# Patient Record
Sex: Female | Born: 1940
Health system: Southern US, Community
[De-identification: ages and names within clinical notes are randomized; demographics above are authoritative.]

## PROBLEM LIST (undated history)

## (undated) DIAGNOSIS — D649 Anemia, unspecified: Secondary | ICD-10-CM

## (undated) DIAGNOSIS — M545 Low back pain, unspecified: Secondary | ICD-10-CM

## (undated) DIAGNOSIS — I499 Cardiac arrhythmia, unspecified: Secondary | ICD-10-CM

## (undated) DIAGNOSIS — I272 Pulmonary hypertension, unspecified: Secondary | ICD-10-CM

## (undated) DIAGNOSIS — K219 Gastro-esophageal reflux disease without esophagitis: Secondary | ICD-10-CM

## (undated) DIAGNOSIS — I10 Essential (primary) hypertension: Secondary | ICD-10-CM

## (undated) DIAGNOSIS — F32A Depression, unspecified: Secondary | ICD-10-CM

## (undated) DIAGNOSIS — R0600 Dyspnea, unspecified: Secondary | ICD-10-CM

## (undated) DIAGNOSIS — F329 Major depressive disorder, single episode, unspecified: Secondary | ICD-10-CM

## (undated) DIAGNOSIS — G8929 Other chronic pain: Secondary | ICD-10-CM

## (undated) DIAGNOSIS — F419 Anxiety disorder, unspecified: Secondary | ICD-10-CM

## (undated) DIAGNOSIS — Z8719 Personal history of other diseases of the digestive system: Secondary | ICD-10-CM

## (undated) DIAGNOSIS — G4730 Sleep apnea, unspecified: Secondary | ICD-10-CM

## (undated) DIAGNOSIS — M1990 Unspecified osteoarthritis, unspecified site: Secondary | ICD-10-CM

## (undated) HISTORY — DX: Anxiety disorder, unspecified: F41.9

## (undated) HISTORY — DX: Major depressive disorder, single episode, unspecified: F32.9

## (undated) HISTORY — DX: Low back pain, unspecified: M54.50

## (undated) HISTORY — DX: Unspecified osteoarthritis, unspecified site: M19.90

## (undated) HISTORY — PX: BREAST BIOPSY: SHX20

## (undated) HISTORY — DX: Depression, unspecified: F32.A

## (undated) HISTORY — DX: Gastro-esophageal reflux disease without esophagitis: K21.9

## (undated) HISTORY — DX: Other chronic pain: G89.29

## (undated) HISTORY — DX: Low back pain: M54.5

---

## 1979-03-17 HISTORY — PX: ABDOMINAL HYSTERECTOMY: SHX81

## 2012-01-04 LAB — HM COLONOSCOPY

## 2012-03-16 HISTORY — PX: CATARACT EXTRACTION, BILATERAL: SHX1313

## 2015-08-15 DIAGNOSIS — F418 Other specified anxiety disorders: Secondary | ICD-10-CM | POA: Diagnosis not present

## 2015-08-15 DIAGNOSIS — L821 Other seborrheic keratosis: Secondary | ICD-10-CM | POA: Diagnosis not present

## 2015-08-15 DIAGNOSIS — R32 Unspecified urinary incontinence: Secondary | ICD-10-CM | POA: Diagnosis not present

## 2015-08-22 DIAGNOSIS — Z1231 Encounter for screening mammogram for malignant neoplasm of breast: Secondary | ICD-10-CM | POA: Diagnosis not present

## 2015-11-11 DIAGNOSIS — H02831 Dermatochalasis of right upper eyelid: Secondary | ICD-10-CM | POA: Diagnosis not present

## 2015-11-11 DIAGNOSIS — H353132 Nonexudative age-related macular degeneration, bilateral, intermediate dry stage: Secondary | ICD-10-CM | POA: Diagnosis not present

## 2015-11-11 DIAGNOSIS — H532 Diplopia: Secondary | ICD-10-CM | POA: Diagnosis not present

## 2015-11-11 DIAGNOSIS — H43813 Vitreous degeneration, bilateral: Secondary | ICD-10-CM | POA: Diagnosis not present

## 2015-11-11 DIAGNOSIS — H02834 Dermatochalasis of left upper eyelid: Secondary | ICD-10-CM | POA: Diagnosis not present

## 2016-02-12 DIAGNOSIS — R748 Abnormal levels of other serum enzymes: Secondary | ICD-10-CM | POA: Diagnosis not present

## 2016-02-12 DIAGNOSIS — Z23 Encounter for immunization: Secondary | ICD-10-CM | POA: Diagnosis not present

## 2016-02-12 DIAGNOSIS — E785 Hyperlipidemia, unspecified: Secondary | ICD-10-CM | POA: Diagnosis not present

## 2016-02-12 DIAGNOSIS — Z13820 Encounter for screening for osteoporosis: Secondary | ICD-10-CM | POA: Diagnosis not present

## 2016-02-12 DIAGNOSIS — Z0000 Encounter for general adult medical examination without abnormal findings: Secondary | ICD-10-CM | POA: Diagnosis not present

## 2016-02-12 DIAGNOSIS — Z780 Asymptomatic menopausal state: Secondary | ICD-10-CM | POA: Diagnosis not present

## 2016-02-12 DIAGNOSIS — F419 Anxiety disorder, unspecified: Secondary | ICD-10-CM | POA: Diagnosis not present

## 2016-02-12 DIAGNOSIS — R7301 Impaired fasting glucose: Secondary | ICD-10-CM | POA: Diagnosis not present

## 2016-02-25 DIAGNOSIS — M25561 Pain in right knee: Secondary | ICD-10-CM | POA: Diagnosis not present

## 2016-03-24 DIAGNOSIS — Z780 Asymptomatic menopausal state: Secondary | ICD-10-CM | POA: Diagnosis not present

## 2016-08-26 DIAGNOSIS — J029 Acute pharyngitis, unspecified: Secondary | ICD-10-CM | POA: Diagnosis not present

## 2016-08-26 DIAGNOSIS — J069 Acute upper respiratory infection, unspecified: Secondary | ICD-10-CM | POA: Diagnosis not present

## 2016-11-12 DIAGNOSIS — H02834 Dermatochalasis of left upper eyelid: Secondary | ICD-10-CM | POA: Diagnosis not present

## 2016-11-12 DIAGNOSIS — H02831 Dermatochalasis of right upper eyelid: Secondary | ICD-10-CM | POA: Diagnosis not present

## 2016-11-12 DIAGNOSIS — H353132 Nonexudative age-related macular degeneration, bilateral, intermediate dry stage: Secondary | ICD-10-CM | POA: Diagnosis not present

## 2016-11-12 DIAGNOSIS — H43813 Vitreous degeneration, bilateral: Secondary | ICD-10-CM | POA: Diagnosis not present

## 2016-11-12 DIAGNOSIS — H532 Diplopia: Secondary | ICD-10-CM | POA: Diagnosis not present

## 2016-11-17 DIAGNOSIS — Z1231 Encounter for screening mammogram for malignant neoplasm of breast: Secondary | ICD-10-CM | POA: Diagnosis not present

## 2016-12-09 DIAGNOSIS — Z23 Encounter for immunization: Secondary | ICD-10-CM | POA: Diagnosis not present

## 2016-12-09 LAB — HM MAMMOGRAPHY: HM MAMMO: NORMAL (ref 0–4)

## 2017-03-31 ENCOUNTER — Ambulatory Visit (HOSPITAL_COMMUNITY)
Admission: RE | Admit: 2017-03-31 | Discharge: 2017-03-31 | Disposition: A | Discharge status: Home / Self Care | Payer: Medicare Other | Source: Ambulatory Visit | Attending: Physician Assistant | Admitting: Physician Assistant

## 2017-03-31 ENCOUNTER — Ambulatory Visit (INDEPENDENT_AMBULATORY_CARE_PROVIDER_SITE_OTHER): Payer: Medicare Other | Admitting: Physician Assistant

## 2017-03-31 ENCOUNTER — Encounter: Payer: Self-pay | Admitting: Physician Assistant

## 2017-03-31 VITALS — BP 122/68 | HR 87 | Temp 97.7°F | Resp 16 | Ht 70.0 in | Wt 223.4 lb

## 2017-03-31 DIAGNOSIS — K2100 Gastro-esophageal reflux disease with esophagitis, without bleeding: Secondary | ICD-10-CM

## 2017-03-31 DIAGNOSIS — Z6832 Body mass index (BMI) 32.0-32.9, adult: Secondary | ICD-10-CM

## 2017-03-31 DIAGNOSIS — F32A Depression, unspecified: Secondary | ICD-10-CM | POA: Insufficient documentation

## 2017-03-31 DIAGNOSIS — Z7189 Other specified counseling: Secondary | ICD-10-CM | POA: Diagnosis not present

## 2017-03-31 DIAGNOSIS — R6889 Other general symptoms and signs: Secondary | ICD-10-CM | POA: Diagnosis not present

## 2017-03-31 DIAGNOSIS — Z0000 Encounter for general adult medical examination without abnormal findings: Secondary | ICD-10-CM

## 2017-03-31 DIAGNOSIS — M5442 Lumbago with sciatica, left side: Secondary | ICD-10-CM | POA: Diagnosis not present

## 2017-03-31 DIAGNOSIS — M545 Low back pain, unspecified: Secondary | ICD-10-CM | POA: Insufficient documentation

## 2017-03-31 DIAGNOSIS — M150 Primary generalized (osteo)arthritis: Secondary | ICD-10-CM

## 2017-03-31 DIAGNOSIS — M159 Polyosteoarthritis, unspecified: Secondary | ICD-10-CM

## 2017-03-31 DIAGNOSIS — G8929 Other chronic pain: Secondary | ICD-10-CM

## 2017-03-31 DIAGNOSIS — R05 Cough: Secondary | ICD-10-CM | POA: Diagnosis not present

## 2017-03-31 DIAGNOSIS — R059 Cough, unspecified: Secondary | ICD-10-CM

## 2017-03-31 DIAGNOSIS — M1990 Unspecified osteoarthritis, unspecified site: Secondary | ICD-10-CM | POA: Insufficient documentation

## 2017-03-31 DIAGNOSIS — Z0001 Encounter for general adult medical examination with abnormal findings: Secondary | ICD-10-CM

## 2017-03-31 DIAGNOSIS — K449 Diaphragmatic hernia without obstruction or gangrene: Secondary | ICD-10-CM | POA: Diagnosis not present

## 2017-03-31 DIAGNOSIS — D649 Anemia, unspecified: Secondary | ICD-10-CM | POA: Diagnosis not present

## 2017-03-31 DIAGNOSIS — E538 Deficiency of other specified B group vitamins: Secondary | ICD-10-CM | POA: Diagnosis not present

## 2017-03-31 DIAGNOSIS — F3342 Major depressive disorder, recurrent, in full remission: Secondary | ICD-10-CM | POA: Diagnosis not present

## 2017-03-31 DIAGNOSIS — G250 Essential tremor: Secondary | ICD-10-CM

## 2017-03-31 DIAGNOSIS — F329 Major depressive disorder, single episode, unspecified: Secondary | ICD-10-CM | POA: Insufficient documentation

## 2017-03-31 MED ORDER — PROMETHAZINE-DM 6.25-15 MG/5ML PO SYRP: 5.0000 mL | ORAL_SOLUTION | Freq: Four times a day (QID) | ORAL | 1 refills | Status: DC | PRN

## 2017-03-31 MED ORDER — AZITHROMYCIN 250 MG PO TABS: ORAL_TABLET | ORAL | 1 refills | Status: DC

## 2017-03-31 NOTE — Progress Notes (Addendum)
Subjective:    Patient ID: Darlene [name], female    DOB: 1940-11-26, 77 y.o.   MRN: [id number]  HPI 77 y.o. WF as a new patient presents with a non productive cough x Nov and medicare wellness visit.   Her daughter is a patient here, cindy Research scientist (life sciences), she moved from [place] to live with her. Developed cough x Nov with moving here. She has runny nose, coughing until she vomits or loses her breath. Has tried cough drops, has tried robitussin and nyquil.  She has GERD, is on prevacid once a day, will occ miss it, still having GERD.  Got flu shot 12/09/2016. She is not driving anymore, daughter is driving.  BMI is Body mass index is 32.05 kg/m., she is working on diet and exercise. Wt Readings from Last 3 Encounters:  03/31/17 223 lb 6.4 oz (101.3 kg)    Blood pressure 122/68, pulse 87, temperature 97.7 F (36.5 C), resp. rate 16, height 5\' 10"  (1.778 m), weight 223 lb 6.4 oz (101.3 kg), SpO2 95 %.  Medications Current Outpatient Medications on File Prior to Visit  Medication Sig  . acetaminophen (TYLENOL) 500 MG tablet Take 500 mg by mouth every 6 (six) hours as needed.  Marland Kitchen aspirin EC 81 MG tablet Take 81 mg by mouth daily.  . Cholecalciferol (VITAMIN D-3 PO) Take 1,000 Units by mouth daily.  . cyclobenzaprine (FLEXERIL) 10 MG tablet Take 10 mg by mouth 3 (three) times daily as needed for muscle spasms.  . diclofenac (VOLTAREN) 75 MG EC tablet Take 75 mg by mouth 2 (two) times daily.  . lansoprazole (PREVACID) 15 MG capsule Take 15 mg by mouth daily at 12 noon.  Marland Kitchen PARoxetine (PAXIL) 20 MG tablet Take 20 mg by mouth daily.  . pilocarpine (PILOCAR) 1 % ophthalmic solution 1 drop 2 (two) times daily.   No current facility-administered medications on file prior to visit.     Problem list She has DJD (degenerative joint disease); Chronic lower back pain; and Depression on their problem list.  Allergies No Known Allergies  SURGICAL HISTORY She  has a past surgical history that includes  Abdominal hysterectomy (03/1979) and Breast biopsy (Left, 1986 and 1987). FAMILY HISTORY Her family history includes Heart disease in her father and mother; Hypertension in her daughter, father, and mother; Tremor in her sister. SOCIAL HISTORY She  reports that  has never smoked. she has never used smokeless tobacco. She reports that she drinks alcohol. She reports that she does not use drugs.   MEDICARE WELLNESS OBJECTIVES: Physical activity: Current Exercise Habits: The patient does not participate in regular exercise at present, Exercise limited by: orthopedic condition(s) Cardiac risk factors: Cardiac Risk Factors include: advanced age (>32men, >3 women);obesity (BMI >30kg/m2);sedentary lifestyle Depression/mood screen:   Depression screen [employer] 2/9 03/31/2017  Decreased Interest 0  Down, Depressed, Hopeless 0  PHQ - 2 Score 0    ADLs:  In your present state of health, do you have any difficulty performing the following activities: 03/31/2017  Hearing? N  Vision? N  Difficulty concentrating or making decisions? N  Walking or climbing stairs? Y  Comment due to ortho  Dressing or bathing? N  Doing errands, shopping? Y  Comment daughter is driving her  Conservation officer, nature and eating ? N  Using the Toilet? N  In the past six months, have you accidently leaked urine? Y  Do you have problems with loss of bowel control? Y  Managing your Medications? N  Managing your  Finances? N  Housekeeping or managing your Housekeeping? N    Cognitive Testing  Alert? Yes  Normal Appearance?Yes  Oriented to person? Yes  Place? Yes   Time? Yes  Recall of three objects?  Yes  Can perform simple calculations? Yes  Displays appropriate judgment?Yes  Can read the correct time from a watch face?Yes  EOL planning: Does Patient Have a Medical Advance Directive?: No Would patient like information on creating a medical advance directive?: Yes (MAU/Ambulatory/Procedural Areas - Information given)  Review of  Systems  Constitutional: Negative for chills, fatigue and fever.  HENT: Positive for postnasal drip. Negative for sore throat.   Eyes: Negative.   Respiratory: Positive for cough. Negative for choking, shortness of breath and wheezing.   Cardiovascular: Negative.  Negative for chest pain.  Gastrointestinal: Positive for abdominal distention, abdominal pain and nausea. Negative for anal bleeding, blood in stool, constipation, diarrhea, rectal pain and vomiting.  Genitourinary: Negative.   Musculoskeletal: Negative.   Skin: Negative.   Neurological: Negative.   Psychiatric/Behavioral: Negative.    Review of Systems  Constitutional: Negative for chills, fatigue and fever.  HENT: Positive for postnasal drip. Negative for sore throat.   Eyes: Negative.   Respiratory: Positive for cough. Negative for choking, shortness of breath and wheezing.   Cardiovascular: Negative.  Negative for chest pain.  Gastrointestinal: Positive for abdominal distention, abdominal pain and nausea. Negative for anal bleeding, blood in stool, constipation, diarrhea, rectal pain and vomiting.  Genitourinary: Negative.   Musculoskeletal: Negative.   Skin: Negative.   Neurological: Negative.   Hematological: Negative.   Psychiatric/Behavioral: Negative.        Objective:   Physical Exam  Constitutional: She is oriented to person, place, and time. She appears well-developed and well-nourished.  HENT:  Head: Normocephalic and atraumatic.  Right Ear: External ear normal.  Left Ear: External ear normal.  Mouth/Throat: Oropharynx is clear and moist.  Eyes: Conjunctivae and EOM are normal. Pupils are equal, round, and reactive to light.  Neck: Normal range of motion. Neck supple. No thyromegaly present.  Cardiovascular: Normal rate, regular rhythm and normal heart sounds. Exam reveals no gallop and no friction rub.  No murmur heard. Pulmonary/Chest: Effort normal and breath sounds normal. No respiratory distress.  She has no wheezes.  Abdominal: Soft. Bowel sounds are normal. She exhibits no shifting dullness, no distension, no abdominal bruit, no pulsatile midline mass and no mass. There is no hepatosplenomegaly. There is tenderness in the epigastric area. There is no rigidity, no rebound, no guarding, no CVA tenderness, no tenderness at McBurney's point and negative Murphy's sign. No hernia.  Musculoskeletal: Normal range of motion.  Lymphadenopathy:    She has no cervical adenopathy.  Neurological: She is alert and oriented to person, place, and time.  Skin: Skin is warm and dry.  Psychiatric: She has a normal mood and affect.       Assessment & Plan:   Essential tremor Has family history, benign and normal neuro  Primary osteoarthritis involving multiple joints Continue diclofenac, increase exercise, weight loss  Chronic low back pain with left-sided sciatica, unspecified back pain laterality Continue diclofenac, increase exercise, weight loss  Recurrent major depressive disorder, in full remission [facility]) Discussed that Colon cancer is 3rd most diagnosed cancer and 2nd leading cause of death in both men and women 30 years of age and older despite being one of the most preventable and treatable cancers if found early. Despite this information patient still declines colonoscopy and understands risk  of cancer and death.   GERD Continue prevacid Discussed diet Some epigatric tenderness? Contributing to cough, can double up on prevacid and or add H2  Cough -     CBC with Differential/Platelet -     BASIC METABOLIC PANEL WITH GFR -     Hepatic function panel -     DG Chest 2 View; Future - -     azithromycin (ZITHROMAX) 250 MG tablet; Take 2 tablets (500 mg) on  Day 1,  followed by 1 tablet (250 mg) once daily on Days 2 through 5. -     promethazine-dextromethorphan (PROMETHAZINE-DM) 6.25-15 MG/5ML syrup; Take 5 mLs by mouth 4 (four) times daily as needed for cough.  Encounter for Medicare  annual wellness exam 1 year Will find out date of last colonoscopy to abstract and will get vaccination history  Advanced care planning/counseling discussion Discussed advanced care planning with patient and/or family At least 30 mins discussed with the patient and/or family at the visit.  Given papers, will discuss with daughter.   BMI 32  Overweight  - long discussion about weight loss, diet, and exercise -recommended diet heavy in fruits and veggies and low in animal meats, cheeses, and dairy products  Schedule CPE 6 months 1 year wellness

## 2017-03-31 NOTE — Patient Instructions (Addendum)
Can add on zantac or pepcid to the prevacid  If you need to for gerd  Will get chest xray  Will send in antibiotic  Please pick one of the over the counter allergy medications below and take it once daily for allergies.  Claritin or loratadine cheapest but likely the weakest  Zyrtec or certizine at night because it can make you sleepy The strongest is allegra or fexafinadine  Cheapest at [facility], [facility], [facility]  diclofenac is an antiinflammatory You can take tylenol (500mg ) or tylenol arthritis (650mg ) with the meloxicam/antiinflammatories. The max you can take of tylenol a day is 3000mg  daily, this is a max of 6 pills a day of the regular tyelnol (500mg ) or a max of 4 a day of the tylenol arthritis (650mg ) as long as no other medications you are taking contain tylenol.   this can cause inflammation in your stomach and can cause ulcers or bleeding, this will look like black tarry stools Make sure you taking it with food Try not to take it daily, take AS needed Can take with zantac  HOW TO TREAT VIRAL COUGH AND COLD SYMPTOMS:  -Symptoms usually last at least 1 week with the worst symptoms being around day 4.  - colds usually start with a sore throat and end with a cough, and the cough can take 2 weeks to get better.  -No antibiotics are needed for colds, flu, sore throats, cough, bronchitis UNLESS symptoms are longer than 7 days OR if you are getting better then get drastically worse.  -There are a lot of combination medications (Dayquil, Nyquil, Vicks 44, tyelnol cold and sinus, ETC). Please look at the ingredients on the back so that you are treating the correct symptoms and not doubling up on medications/ingredients.    Medicines you can use  Nasal congestion  - pseudoephedrine (Sudafed)- behind the counter, do not use if you have high blood pressure, medicine that have -D in them.  - phenylephrine (Sudafed PE) -Dextormethorphan + chlorpheniramine (Coridcidin HBP)- okay if you have  high blood pressure -Oxymetazoline (Afrin) nasal spray- LIMIT to 3 days -Saline nasal spray -Neti pot (used distilled or bottled water)  Ear pain/congestion  -pseudoephedrine (sudafed) - Nasonex/flonase nasal spray  Fever  -Acetaminophen (Tyelnol) -Ibuprofen (Advil, motrin, aleve)  Sore Throat  -Acetaminophen (Tyelnol) -Ibuprofen (Advil, motrin, aleve) -Drink a lot of water -Gargle with salt water - Rest your voice (don't talk) -Throat sprays -Cough drops  Body Aches  -Acetaminophen (Tyelnol) -Ibuprofen (Advil, motrin, aleve)  Headache  -Acetaminophen (Tyelnol) -Ibuprofen (Advil, motrin, aleve) - Exedrin, Exedrin Migraine  Allergy symptoms (cough, sneeze, runny nose, itchy eyes) -Claritin or loratadine cheapest but likely the weakest  -Zyrtec or certizine at night because it can make you sleepy -The strongest is allegra or fexafinadine  Cheapest at [facility], [employer], [employer]  Cough  -Dextromethorphan (Delsym)- medicine that has DM in it -Guafenesin (Mucinex/Robitussin) - cough drops - drink lots of water  Chest Congestion  -Guafenesin (Mucinex/Robitussin)  Red Itchy Eyes  - Naphcon-A  Upset Stomach  - Bland diet (nothing spicy, greasy, fried, and high acid foods like tomatoes, oranges, berries) -OKAY- cereal, bread, soup, crackers, rice -Eat smaller more frequent meals -reduce caffeine, no alcohol -Loperamide (Imodium-AD) if diarrhea -Prevacid for heart burn  General health when sick  -Hydration -wash your hands frequently -keep surfaces clean -change pillow cases and sheets often -Get fresh air but do not exercise strenuously -Vitamin D, double up on it - Vitamin C -Zinc

## 2017-04-01 DIAGNOSIS — K219 Gastro-esophageal reflux disease without esophagitis: Secondary | ICD-10-CM | POA: Insufficient documentation

## 2017-04-01 LAB — CBC WITH DIFFERENTIAL/PLATELET
Basophils Absolute: 59 cells/uL (ref 0–200)
Basophils Relative: 0.9 %
Eosinophils Absolute: 152 cells/uL (ref 15–500)
Eosinophils Relative: 2.3 %
HCT: 28.2 % — ABNORMAL LOW (ref 35.0–45.0)
Hemoglobin: 9 g/dL — ABNORMAL LOW (ref 11.7–15.5)
Lymphs Abs: 1610 cells/uL (ref 850–3900)
MCH: 26.9 pg — ABNORMAL LOW (ref 27.0–33.0)
MCHC: 31.9 g/dL — ABNORMAL LOW (ref 32.0–36.0)
MCV: 84.2 fL (ref 80.0–100.0)
MONOS PCT: 10.2 %
MPV: 11.6 fL (ref 7.5–12.5)
NEUTROS PCT: 62.2 %
Neutro Abs: 4105 cells/uL (ref 1500–7800)
PLATELETS: 291 10*3/uL (ref 140–400)
RBC: 3.35 10*6/uL — AB (ref 3.80–5.10)
RDW: 14.4 % (ref 11.0–15.0)
TOTAL LYMPHOCYTE: 24.4 %
WBC: 6.6 10*3/uL (ref 3.8–10.8)
WBCMIX: 673 {cells}/uL (ref 200–950)

## 2017-04-01 LAB — TEST AUTHORIZATION

## 2017-04-01 LAB — IRON,TIBC AND FERRITIN PANEL
%SAT: 5 % (calc) — ABNORMAL LOW (ref 11–50)
Ferritin: 7 ng/mL — ABNORMAL LOW (ref 20–288)
IRON: 25 ug/dL — AB (ref 45–160)
TIBC: 462 ug/dL — AB (ref 250–450)

## 2017-04-01 LAB — BASIC METABOLIC PANEL WITH GFR
BUN: 20 mg/dL (ref 7–25)
CALCIUM: 9.5 mg/dL (ref 8.6–10.4)
CHLORIDE: 104 mmol/L (ref 98–110)
CO2: 29 mmol/L (ref 20–32)
Creat: 0.71 mg/dL (ref 0.60–0.93)
GFR, EST AFRICAN AMERICAN: 95 mL/min/{1.73_m2} (ref >=60)
GFR, Est Non African American: 82 mL/min/{1.73_m2} (ref >=60)
Glucose, Bld: 93 mg/dL (ref 65–99)
POTASSIUM: 4.3 mmol/L (ref 3.5–5.3)
Sodium: 141 mmol/L (ref 135–146)

## 2017-04-01 LAB — VITAMIN B12: Vitamin B-12: 423 pg/mL (ref 200–1100)

## 2017-04-01 LAB — HEPATIC FUNCTION PANEL
AG RATIO: 2 (calc) (ref 1.0–2.5)
ALKALINE PHOSPHATASE (APISO): 54 U/L (ref 33–130)
ALT: 12 U/L (ref 6–29)
AST: 17 U/L (ref 10–35)
Albumin: 4 g/dL (ref 3.6–5.1)
BILIRUBIN DIRECT: 0.1 mg/dL (ref 0.0–0.2)
BILIRUBIN TOTAL: 0.4 mg/dL (ref 0.2–1.2)
Globulin: 2 g/dL (calc) (ref 1.9–3.7)
Indirect Bilirubin: 0.3 mg/dL (calc) (ref 0.2–1.2)
Total Protein: 6 g/dL — ABNORMAL LOW (ref 6.1–8.1)

## 2017-04-02 ENCOUNTER — Other Ambulatory Visit: Payer: Self-pay | Admitting: Physician Assistant

## 2017-04-02 DIAGNOSIS — K219 Gastro-esophageal reflux disease without esophagitis: Secondary | ICD-10-CM

## 2017-04-02 DIAGNOSIS — D509 Iron deficiency anemia, unspecified: Secondary | ICD-10-CM

## 2017-04-04 DIAGNOSIS — D649 Anemia, unspecified: Secondary | ICD-10-CM | POA: Insufficient documentation

## 2017-04-04 NOTE — Progress Notes (Signed)
   Subjective:    Patient ID: Darlene [name], female    DOB: 01-Nov-1940, 77 y.o.   MRN: [id number]  HPI 77 y.o. female presents as new patient for cough and GERD, found to have iron def anemia. Last colonoscopy Oct 2013. She has increased her prevacid to 2 a day, and her cough has improved. She has history of GERD and having her esophageal stretching. She has started iron pills but complains of constipation.  She states her stool is normally dark, but denies black stool/blood in stool, she has GERD, she has SOB/leg cramps.   Blood pressure 124/76, pulse 78, temperature 97.7 F (36.5 C), resp. rate 14, height 5\' 10"  (1.778 m), weight 224 lb 6.4 oz (101.8 kg), SpO2 97 %.  Medications Current Outpatient Medications on File Prior to Visit  Medication Sig  . acetaminophen (TYLENOL) 500 MG tablet Take 500 mg by mouth every 6 (six) hours as needed.  Marland Kitchen aspirin EC 81 MG tablet Take 81 mg by mouth daily.  . Cholecalciferol (VITAMIN D-3 PO) Take 1,000 Units by mouth daily.  . cyclobenzaprine (FLEXERIL) 10 MG tablet Take 10 mg by mouth 3 (three) times daily as needed for muscle spasms.  . diclofenac (VOLTAREN) 75 MG EC tablet Take 75 mg by mouth 2 (two) times daily.  . lansoprazole (PREVACID) 15 MG capsule Take 15 mg by mouth daily at 12 noon.  Marland Kitchen PARoxetine (PAXIL) 20 MG tablet Take 20 mg by mouth daily.  . pilocarpine (PILOCAR) 1 % ophthalmic solution 1 drop 2 (two) times daily.   No current facility-administered medications on file prior to visit.     Problem list She has DJD (degenerative joint disease); Chronic lower back pain; Depression; GERD (gastroesophageal reflux disease); and Anemia on their problem list.  Review of Systems  Constitutional: Negative.   HENT: Negative.  Negative for sore throat.   Eyes: Negative.   Respiratory: Positive for shortness of breath. Negative for cough, choking and wheezing.   Cardiovascular: Negative.  Negative for chest pain.  Gastrointestinal: Positive  for abdominal distention, abdominal pain and nausea. Negative for anal bleeding, blood in stool, constipation, diarrhea, rectal pain and vomiting.  Genitourinary: Negative.   Musculoskeletal: Negative.   Skin: Negative.   Neurological: Negative.   Hematological: Negative.   Psychiatric/Behavioral: Negative.        Objective:   Physical Exam  Abdominal: Normal appearance and bowel sounds are normal. She exhibits no mass. There is tenderness in the epigastric area and left lower quadrant. There is no rigidity, no rebound, no guarding, no CVA tenderness, no tenderness at McBurney's point and negative Murphy's sign.  Genitourinary: Rectal exam shows external hemorrhoid and mass (4-5 oclock, hard bulbous masses, no soft like hemorrhoid). Rectal exam shows no internal hemorrhoid, no fissure, no tenderness, anal tone normal and guaiac negative stool.      Assessment & Plan:   Anemia, unspecified type -     CBC with Differential/Platelet -     Folate RBC -     Reticulocytes Count -     POC Hemoccult Bld/Stl (1-Cd Office Dx)- NEGATIVE  Recheck labs today, negative hemoccult, abnormal rectal exam, has OV with GI, .   Future Appointments  Date Time Provider [place]  09/30/2017  2:00 PM [name], NP [facility] None  03/31/2018 10:45 AM Vicie Mutters, PA-C [facility] None

## 2017-04-05 ENCOUNTER — Ambulatory Visit: Payer: Self-pay | Admitting: Physician Assistant

## 2017-04-05 ENCOUNTER — Encounter: Payer: Self-pay | Admitting: Physician Assistant

## 2017-04-05 ENCOUNTER — Encounter: Payer: Self-pay | Admitting: Gastroenterology

## 2017-04-05 ENCOUNTER — Ambulatory Visit (INDEPENDENT_AMBULATORY_CARE_PROVIDER_SITE_OTHER): Payer: Medicare Other | Admitting: Physician Assistant

## 2017-04-05 DIAGNOSIS — D649 Anemia, unspecified: Secondary | ICD-10-CM | POA: Diagnosis not present

## 2017-04-05 LAB — POC HEMOCCULT BLD/STL (OFFICE/1-CARD/DIAGNOSTIC): FECAL OCCULT BLD: NEGATIVE

## 2017-04-05 NOTE — Patient Instructions (Signed)
Anemia Anemia is a condition in which you do not have enough red blood cells or hemoglobin. Hemoglobin is a substance in red blood cells that carries oxygen. When you do not have enough red blood cells or hemoglobin (are anemic), your body cannot get enough oxygen and your organs may not work properly. As a result, you may feel very tired or have other problems. What are the causes? Common causes of anemia include:  Excessive bleeding. Anemia can be caused by excessive bleeding inside or outside the body, including bleeding from the intestine or from periods in women.  Poor nutrition.  Long-lasting (chronic) kidney, thyroid, and liver disease.  Bone marrow disorders.  Cancer and treatments for cancer.  HIV (human immunodeficiency virus) and AIDS (acquired immunodeficiency syndrome).  Treatments for HIV and AIDS.  Spleen problems.  Blood disorders.  Infections, medicines, and autoimmune disorders that destroy red blood cells.  What are the signs or symptoms? Symptoms of this condition include:  Minor weakness.  Dizziness.  Headache.  Feeling heartbeats that are irregular or faster than normal (palpitations).  Shortness of breath, especially with exercise.  Paleness.  Cold sensitivity.  Indigestion.  Nausea.  Difficulty sleeping.  Difficulty concentrating.  Symptoms may occur suddenly or develop slowly. If your anemia is mild, you may not have symptoms. How is this diagnosed? This condition is diagnosed based on:  Blood tests.  Your medical history.  A physical exam.  Bone marrow biopsy.  Your health care provider may also check your stool (feces) for blood and may do additional testing to look for the cause of your bleeding. You may also have other tests, including:  Imaging tests, such as a CT scan or MRI.  Endoscopy.  Colonoscopy.  How is this treated? Treatment for this condition depends on the cause. If you continue to lose a lot of blood,  you may need to be treated at a hospital. Treatment may include:  Taking supplements of iron, vitamin B12, or folic acid.  Taking a hormone medicine (erythropoietin) that can help to stimulate red blood cell growth.  Having a blood transfusion. This may be needed if you lose a lot of blood.  Making changes to your diet.  Having surgery to remove your spleen.  Follow these instructions at home:  Take over-the-counter and prescription medicines only as told by your health care provider.  Take supplements only as told by your health care provider.  Follow any diet instructions that you were given.  Keep all follow-up visits as told by your health care provider. This is important. Contact a health care provider if:  You develop new bleeding anywhere in the body. Get help right away if:  You are very weak.  You are short of breath.  You have pain in your abdomen or chest.  You are dizzy or feel faint.  You have trouble concentrating.  You have bloody or black, tarry stools.  You vomit repeatedly or you vomit up blood. Summary  Anemia is a condition in which you do not have enough red blood cells or enough of a substance in your red blood cells that carries oxygen (hemoglobin).  Symptoms may occur suddenly or develop slowly.  If your anemia is mild, you may not have symptoms.  This condition is diagnosed with blood tests as well as a medical history and physical exam. Other tests may be needed.  Treatment for this condition depends on the cause of the anemia. This information is not intended to replace advice   given to you by your health care provider. Make sure you discuss any questions you have with your health care provider. Document Released: 04/09/2004 Document Revised: 04/03/2016 Document Reviewed: 04/03/2016 Elsevier Interactive Patient Education  [employer].

## 2017-04-06 LAB — CBC WITH DIFFERENTIAL/PLATELET
BASOS PCT: 1 %
Basophils Absolute: 61 cells/uL (ref 0–200)
EOS ABS: 189 {cells}/uL (ref 15–500)
Eosinophils Relative: 3.1 %
HEMATOCRIT: 27.9 % — AB (ref 35.0–45.0)
HEMOGLOBIN: 8.9 g/dL — AB (ref 11.7–15.5)
Lymphs Abs: 1964 cells/uL (ref 850–3900)
MCH: 26.9 pg — AB (ref 27.0–33.0)
MCHC: 31.9 g/dL — ABNORMAL LOW (ref 32.0–36.0)
MCV: 84.3 fL (ref 80.0–100.0)
MPV: 11.5 fL (ref 7.5–12.5)
Monocytes Relative: 11.4 %
Neutro Abs: 3190 cells/uL (ref 1500–7800)
Neutrophils Relative %: 52.3 %
PLATELETS: 288 10*3/uL (ref 140–400)
RBC: 3.31 10*6/uL — AB (ref 3.80–5.10)
RDW: 14.3 % (ref 11.0–15.0)
Total Lymphocyte: 32.2 %
WBC: 6.1 10*3/uL (ref 3.8–10.8)
WBCMIX: 695 {cells}/uL (ref 200–950)

## 2017-04-06 LAB — RETICULOCYTES
ABS Retic: 59760 cells/uL (ref 20000–8000)
Retic Ct Pct: 1.8 %

## 2017-04-06 LAB — FOLATE RBC: RBC FOLATE: 1009 ng/mL (ref >280)

## 2017-04-19 ENCOUNTER — Other Ambulatory Visit: Payer: Self-pay

## 2017-04-19 MED ORDER — DICLOFENAC SODIUM 75 MG PO TBEC: 75.0000 mg | DELAYED_RELEASE_TABLET | Freq: Two times a day (BID) | ORAL | 0 refills | Status: DC

## 2017-04-19 MED ORDER — PAROXETINE HCL 20 MG PO TABS: 20.0000 mg | ORAL_TABLET | Freq: Every day | ORAL | 0 refills | Status: DC

## 2017-05-11 ENCOUNTER — Ambulatory Visit (INDEPENDENT_AMBULATORY_CARE_PROVIDER_SITE_OTHER): Payer: Medicare Other | Admitting: Gastroenterology

## 2017-05-11 ENCOUNTER — Other Ambulatory Visit (INDEPENDENT_AMBULATORY_CARE_PROVIDER_SITE_OTHER): Payer: Medicare Other

## 2017-05-11 ENCOUNTER — Encounter: Payer: Self-pay | Admitting: Gastroenterology

## 2017-05-11 VITALS — BP 124/74 | HR 65 | Resp 98 | Ht 70.0 in | Wt 223.8 lb

## 2017-05-11 DIAGNOSIS — D509 Iron deficiency anemia, unspecified: Secondary | ICD-10-CM

## 2017-05-11 DIAGNOSIS — R1310 Dysphagia, unspecified: Secondary | ICD-10-CM

## 2017-05-11 DIAGNOSIS — K219 Gastro-esophageal reflux disease without esophagitis: Secondary | ICD-10-CM | POA: Diagnosis not present

## 2017-05-11 LAB — IGA: IgA: 110 mg/dL (ref 68–378)

## 2017-05-11 MED ORDER — NA SULFATE-K SULFATE-MG SULF 17.5-3.13-1.6 GM/177ML PO SOLN: 1.0000 | Freq: Once | ORAL | 0 refills | Status: AC

## 2017-05-11 NOTE — Progress Notes (Signed)
History of Present Illness: This is a 77 year old female referred by Vicie Mutters, PA-C for the evaluation of iron deficiency anemia with Hemoccult negative stool, GERD, HH and dysphagia.  She relates a colonoscopy performed in August 2012 in [place], [place]. She does not recall any findings however she recalls that the exam was not complete to the cecum. She has a history of GERD and esophageal strictures requiring dilation most recently in July 2016, also in [place], [place]. Unfortunately we do not have those records available today.  She relates frequent reflux symptoms despite current medical therapy. Over the past few months solid food dysphagia has returned.  Following her dilation in 2016 she did have about 2 years of relief from her dysphagia.  She has a long-term bowel pattern of mild alternating diarrhea and constipation which has not changed.  She states she eats small meals because if she eats large meals she will often have nausea and vomiting. Denies weight loss, abdominal pain, change in stool caliber, melena, hematochezia, chest pain.    No Known Allergies Outpatient Medications Prior to Visit  Medication Sig Dispense Refill  . acetaminophen (TYLENOL) 500 MG tablet Take 500 mg by mouth every 6 (six) hours as needed.    . Ascorbic Acid (VITAMIN C) 500 MG CAPS Take by mouth.    Marland Kitchen aspirin EC 81 MG tablet Take 81 mg by mouth daily.    . Cholecalciferol (VITAMIN D-3 PO) Take 1,000 Units by mouth daily.    . cyanocobalamin 1000 MCG tablet Take 1,000 mcg by mouth daily.    . cyclobenzaprine (FLEXERIL) 10 MG tablet Take 10 mg by mouth 3 (three) times daily as needed for muscle spasms.    . diclofenac (VOLTAREN) 75 MG EC tablet Take 1 tablet (75 mg total) by mouth 2 (two) times daily. 180 tablet 0  . Ferrous Sulfate (IRON) 325 (65 Fe) MG TABS Take by mouth.    . lansoprazole (PREVACID) 15 MG capsule Take 15 mg by mouth daily at 12 noon.    Marland Kitchen PARoxetine (PAXIL) 20 MG tablet Take 1 tablet  (20 mg total) by mouth daily. 90 tablet 0  . pilocarpine (PILOCAR) 1 % ophthalmic solution 1 drop 2 (two) times daily.    . ranitidine (ZANTAC) 150 MG capsule Take 150 mg by mouth every evening.     No facility-administered medications prior to visit.    Past Medical History:  Diagnosis Date  . Anxiety   . Chronic lower back pain   . Depression   . DJD (degenerative joint disease)    knees, neck  . GERD (gastroesophageal reflux disease)    has had dilitation in the past   Past Surgical History:  Procedure Laterality Date  . ABDOMINAL HYSTERECTOMY  03/1979  . BREAST BIOPSY Left 1986 and 1987   Social History   Socioeconomic History  . Marital status: Widowed    Spouse name: None  . Number of children: 2  . Years of education: None  . Highest education level: None  Social Needs  . Financial resource strain: None  . Food insecurity - worry: None  . Food insecurity - inability: None  . Transportation needs - medical: None  . Transportation needs - non-medical: None  Occupational History  . Occupation: retired    Comment: worked in Academic librarian  . Smoking status: Never Smoker  . Smokeless tobacco: Never Used  Substance and Sexual Activity  . Alcohol use: Yes    Comment: 3  nights out of the week  . Drug use: No  . Sexual activity: Not Currently  Other Topics Concern  . None  Social History Narrative  . None   Family History  Problem Relation Age of Onset  . Hypertension Mother   . Heart disease Mother   . Hypertension Father   . Heart disease Father   . Tremor Sister   . Hypertension Daughter       Review of Systems: Pertinent positive and negative review of systems were noted in the above HPI section. All other review of systems were otherwise negative.    Physical Exam: General: Well developed, well nourished, no acute distress Head: Normocephalic and atraumatic Eyes:  sclerae anicteric, EOMI Ears: Normal auditory acuity Mouth: No deformity  or lesions Neck: Supple, no masses or thyromegaly Lungs: Clear throughout to auscultation Heart: Regular rate and rhythm; no murmurs, rubs or bruits Abdomen: Soft, non tender and non distended. No masses, hepatosplenomegaly or hernias noted. Normal Bowel sounds Rectal: Deferred to colonoscopy Musculoskeletal: Symmetrical with no gross deformities  Skin: No lesions on visible extremities Pulses:  Normal pulses noted Extremities: No clubbing, cyanosis, edema or deformities noted Neurological: Alert oriented x 4, grossly nonfocal Cervical Nodes:  No significant cervical adenopathy Inguinal Nodes: No significant inguinal adenopathy Psychological:  Alert and cooperative. Normal mood and affect  Assessment and Recommendations:  1. Iron deficiency anemia with Hemoccult negative stool.  Rule out colorectal neoplasms, AVMs, ulcer, Cameron erosions, celiac disease, etc. She takes diclofenac and aspirin daily   Follow-up with PCP for management of iron deficiency.  Obtain IgA and tTG today.  Schedule colonoscopy and EGD. The risks (including bleeding, perforation, infection, missed lesions, medication reactions and possible hospitalization or surgery if complications occur), benefits, and alternatives to colonoscopy with possible biopsy and possible polypectomy were discussed with the patient and they consent to proceed. upper endoscopy.  Attempt to obtain records from prior colonoscopy and EGD.  2.  GERD with frequent breakthrough symptoms and recurrent solid food dysphagia.  Suspected recurrent esophageal stricture.  Increase Prevacid OTC to 30 mg p.o. every morning.  Continue ranitidine 150 mg at bedtime.  Follow standard antireflux measures.  EGD with possible dilation as above.   cc: Vicie Mutters, PA-C [address] [place], [place] [address]

## 2017-05-11 NOTE — Patient Instructions (Signed)
Your physician has requested that you go to the basement for lab work before leaving today.  Increase your over the counter Prevacid to 2 tablets by mouth every morning.   Patient advised to avoid spicy, acidic, citrus, chocolate, mints, fruit and fruit juices.  Limit the intake of caffeine, alcohol and Soda.  Don't exercise too soon after eating.  Don't lie down within 3-4 hours of eating.  Elevate the head of your bed.  You have been scheduled for an endoscopy and colonoscopy. Please follow the written instructions given to you at your visit today. Please pick up your prep supplies at the pharmacy within the next 1-3 days. If you use inhalers (even only as needed), please bring them with you on the day of your procedure. Your physician has requested that you go to www.startemmi.com and enter the access code given to you at your visit today. This web site gives a general overview about your procedure. However, you should still follow specific instructions given to you by our office regarding your preparation for the procedure.  Thank you for choosing me and [facility].  Pricilla Riffle. Dagoberto Ligas., MD., Mar[facility] Regal

## 2017-05-12 LAB — TISSUE TRANSGLUTAMINASE, IGA: (tTG) Ab, IgA: 1 U/mL

## 2017-06-07 ENCOUNTER — Other Ambulatory Visit: Payer: Self-pay

## 2017-06-07 ENCOUNTER — Encounter: Payer: Self-pay | Admitting: Gastroenterology

## 2017-06-07 ENCOUNTER — Ambulatory Visit (AMBULATORY_SURGERY_CENTER): Payer: Medicare Other | Admitting: Gastroenterology

## 2017-06-07 VITALS — BP 107/65 | HR 65 | Temp 98.0°F | Resp 12 | Ht 70.0 in | Wt 223.0 lb

## 2017-06-07 DIAGNOSIS — K222 Esophageal obstruction: Secondary | ICD-10-CM | POA: Diagnosis not present

## 2017-06-07 DIAGNOSIS — K449 Diaphragmatic hernia without obstruction or gangrene: Secondary | ICD-10-CM

## 2017-06-07 DIAGNOSIS — R1310 Dysphagia, unspecified: Secondary | ICD-10-CM

## 2017-06-07 DIAGNOSIS — D509 Iron deficiency anemia, unspecified: Secondary | ICD-10-CM | POA: Diagnosis not present

## 2017-06-07 DIAGNOSIS — D125 Benign neoplasm of sigmoid colon: Secondary | ICD-10-CM | POA: Diagnosis not present

## 2017-06-07 DIAGNOSIS — K219 Gastro-esophageal reflux disease without esophagitis: Secondary | ICD-10-CM | POA: Diagnosis not present

## 2017-06-07 DIAGNOSIS — D123 Benign neoplasm of transverse colon: Secondary | ICD-10-CM

## 2017-06-07 MED ORDER — SODIUM CHLORIDE 0.9 % IV SOLN: 500.0000 mL | Freq: Once | INTRAVENOUS | Status: DC

## 2017-06-07 NOTE — Progress Notes (Signed)
Pt's states no medical or surgical changes since previsit or office visit. 

## 2017-06-07 NOTE — Progress Notes (Signed)
Called to room to assist during endoscopic procedure.  Patient ID and intended procedure confirmed with present staff. Received instructions for my participation in the procedure from the performing physician.  

## 2017-06-07 NOTE — Patient Instructions (Signed)
YOU HAD AN ENDOSCOPIC PROCEDURE TODAY AT [facility]:   Refer to the procedure report that was given to you for any specific questions about what was found during the examination.  If the procedure report does not answer your questions, please call your gastroenterologist to clarify.  If you requested that your care partner not be given the details of your procedure findings, then the procedure report has been included in a sealed envelope for you to review at your convenience later.  YOU SHOULD EXPECT: Some feelings of bloating in the abdomen. Passage of more gas than usual.  Walking can help get rid of the air that was put into your GI tract during the procedure and reduce the bloating. If you had a lower endoscopy (such as a colonoscopy or flexible sigmoidoscopy) you may notice spotting of blood in your stool or on the toilet paper. If you underwent a bowel prep for your procedure, you may not have a normal bowel movement for a few days.  Please Note:  You might notice some irritation and congestion in your nose or some drainage.  This is from the oxygen used during your procedure.  There is no need for concern and it should clear up in a day or so.  SYMPTOMS TO REPORT IMMEDIATELY:   Following lower endoscopy (colonoscopy or flexible sigmoidoscopy):  Excessive amounts of blood in the stool  Significant tenderness or worsening of abdominal pains  Swelling of the abdomen that is new, acute  Fever of 100F or higher   Following upper endoscopy (EGD)  Vomiting of blood or coffee ground material  New chest pain or pain under the shoulder blades  Painful or persistently difficult swallowing  New shortness of breath  Fever of 100F or higher  Black, tarry-looking stools  For urgent or emergent issues, a gastroenterologist can be reached at any hour by calling [phone].   DIET:  Clear liquid diet for 2 hours beginning at 4:15pm, then soft diet for the remainder of today  beginning at 6:15pm. Then you may proceed to your regular diet as tolerated tomorrow.  Drink plenty of fluids but you should avoid alcoholic beverages for 24 hours.  MEDICATIONS: Continue present medications. No Aspirin, Ibuprofen, Naproxen, or other non-steroidal anti-inflammatory drugs for 2 weeks after polyp removal.  Please see handouts given to you by your recovery nurse.  ACTIVITY:  You should plan to take it easy for the rest of today and you should NOT DRIVE or use heavy machinery until tomorrow (because of the sedation medicines used during the test).    FOLLOW UP: Our staff will call the number listed on your records the next business day following your procedure to check on you and address any questions or concerns that you may have regarding the information given to you following your procedure. If we do not reach you, we will leave a message.  However, if you are feeling well and you are not experiencing any problems, there is no need to return our call.  We will assume that you have returned to your regular daily activities without incident.  If any biopsies were taken you will be contacted by phone or by letter within the next 1-3 weeks.  Please call us at [phone] if you have not heard about the biopsies in 3 weeks.   Thank you for allowing [place] to provide for your healthcare needs today.   SIGNATURES/CONFIDENTIALITY: You and/or your care partner have signed paperwork which will  be entered into your electronic medical record.  These signatures attest to the fact that that the information above on your After Visit Summary has been reviewed and is understood.  Full responsibility of the confidentiality of this discharge information lies with you and/or your care-partner. 

## 2017-06-07 NOTE — Op Note (Signed)
[facility] Patient Name: Darlene [name] Procedure Date: 06/07/2017 2:24 PM MRN: [id number] Endoscopist: Ladene Artist , MD Age: 77 Referring MD:  Date of Birth: 11-30-1940 Gender: Female Account #: [id number] Procedure:                Upper GI endoscopy Indications:              Dysphagia, Gastroesophageal reflux disease Medicines:                Monitored Anesthesia Care Procedure:                Pre-Anesthesia Assessment:                           - Prior to the procedure, a History and Physical                            was performed, and patient medications and                            allergies were reviewed. The patient's tolerance of                            previous anesthesia was also reviewed. The risks                            and benefits of the procedure and the sedation                            options and risks were discussed with the patient.                            All questions were answered, and informed consent                            was obtained. Prior Anticoagulants: The patient has                            taken no previous anticoagulant or antiplatelet                            agents. ASA Grade Assessment: III - A patient with                            severe systemic disease. After reviewing the risks                            and benefits, the patient was deemed in                            satisfactory condition to undergo the procedure.                           After obtaining informed consent, the endoscope was  passed under direct vision. Throughout the                            procedure, the patient's blood pressure, pulse, and                            oxygen saturations were monitored continuously. The                            Endoscope was introduced through the mouth, and                            advanced to the second part of duodenum. The upper                            GI  endoscopy was accomplished without difficulty.                            The patient tolerated the procedure well. Scope In: Scope Out: Findings:                 One moderate benign-appearing, intrinsic stenosis                            was found at the gastroesophageal junction. This                            measured 1.2 cm (inner diameter) and was traversed.                            A guidewire was placed and the scope was withdrawn.                            Dilations were performed with Savary dilators with                            mild resistance at 13 mm, 14 mm and 15 mm.                            Estimated blood loss was minimal.                           The exam of the esophagus was otherwise normal.                           A medium-sized hiatal hernia was present.                           The exam of the stomach was otherwise normal.                           The duodenal bulb and second portion of the  duodenum were normal. Complications:            No immediate complications. Estimated Blood Loss:     Estimated blood loss was minimal. Impression:               - Benign-appearing esophageal stenosis. Dilated.                           - Medium-sized hiatal hernia.                           - Normal duodenal bulb and second portion of the                            duodenum.                           - No specimens collected. Recommendation:           - Patient has a contact number available for                            emergencies. The signs and symptoms of potential                            delayed complications were discussed with the                            patient. Return to normal activities tomorrow.                            Written discharge instructions were provided to the                            patient.                           - Clear liquid diet for 2 hours, then advance as                            tolerated  to soft diet today. Resume prior diet                            tomorrow.                           - Continue present medications.                           - Antireflux measures. Ladene Artist, MD 06/07/2017 3:17:58 PM This report has been signed electronically.

## 2017-06-07 NOTE — Op Note (Signed)
[facility] Patient Name: Darlene [name] Procedure Date: 06/07/2017 2:25 PM MRN: [id number] Endoscopist: Ladene Artist , MD Age: 77 Referring MD:  Date of Birth: Sep 21, 1940 Gender: Female Account #: [id number] Procedure:                Colonoscopy Indications:              Iron deficiency anemia Medicines:                Monitored Anesthesia Care Procedure:                Pre-Anesthesia Assessment:                           - Prior to the procedure, a History and Physical                            was performed, and patient medications and                            allergies were reviewed. The patient's tolerance of                            previous anesthesia was also reviewed. The risks                            and benefits of the procedure and the sedation                            options and risks were discussed with the patient.                            All questions were answered, and informed consent                            was obtained. Prior Anticoagulants: The patient has                            taken no previous anticoagulant or antiplatelet                            agents. ASA Grade Assessment: III - A patient with                            severe systemic disease. After reviewing the risks                            and benefits, the patient was deemed in                            satisfactory condition to undergo the procedure.                           After obtaining informed consent, the colonoscope  was passed under direct vision. Throughout the                            procedure, the patient's blood pressure, pulse, and                            oxygen saturations were monitored continuously. The                            Colonoscope was introduced through the anus and                            advanced to the the cecum, identified by                            appendiceal orifice and ileocecal valve. The                             ileocecal valve, appendiceal orifice, and rectum                            were photographed. The quality of the bowel                            preparation was good. The colonoscopy was performed                            without difficulty. The patient tolerated the                            procedure well. Scope In: 2:37:31 PM Scope Out: 3:01:28 PM Scope Withdrawal Time: 0 hours 13 minutes 43 seconds  Total Procedure Duration: 0 hours 23 minutes 57 seconds  Findings:                 The perianal and digital rectal examinations were                            normal.                           A 12 mm polyp was found in the transverse colon.                            The polyp was sessile. The polyp was removed with a                            hot snare. Resection and retrieval were complete.                           A 7 mm polyp was found in the sigmoid colon. The                            polyp was sessile. The polyp was removed with a  cold snare. Resection and retrieval were complete.                           Internal hemorrhoids were found during                            retroflexion. The hemorrhoids were small and Grade                            I (internal hemorrhoids that do not prolapse).                           The exam was otherwise without abnormality on                            direct and retroflexion views. Complications:            No immediate complications. Estimated blood loss:                            None. Estimated Blood Loss:     Estimated blood loss: none. Impression:               - One 12 mm polyp in the transverse colon, removed                            with a hot snare. Resected and retrieved.                           - One 7 mm polyp in the sigmoid colon, removed with                            a cold snare. Resected and retrieved.                           - Internal hemorrhoids.                            - The examination was otherwise normal on direct                            and retroflexion views. Recommendation:           - Repeat colonoscopy date to be determined after                            pending pathology results are reviewed for                            surveillance.                           - Patient has a contact number available for                            emergencies. The signs and symptoms of potential  delayed complications were discussed with the                            patient. Return to normal activities tomorrow.                            Written discharge instructions were provided to the                            patient.                           - Resume previous diet.                           - Continue present medications.                           - Await pathology results.                           - No aspirin, ibuprofen, naproxen, or other                            non-steroidal anti-inflammatory drugs for 2 weeks                            after polyp removal. Ladene Artist, MD 06/07/2017 3:13:39 PM This report has been signed electronically.

## 2017-06-07 NOTE — Progress Notes (Signed)
A/ox3 pleased with MAC, report to Sarah RN 

## 2017-06-08 ENCOUNTER — Telehealth: Payer: Self-pay | Admitting: *Deleted

## 2017-06-08 NOTE — Telephone Encounter (Signed)
  Follow up Call-  Call back number 06/07/2017  Post procedure Call Back phone  # [phone]  Permission to leave phone message Yes     Patient questions:  Do you have a fever, pain , or abdominal swelling? No. Pain Score  0 *  Have you tolerated food without any problems? Yes.    Have you been able to return to your normal activities? Yes.    Do you have any questions about your discharge instructions: Diet   No. Medications  No. Follow up visit  No.  Do you have questions or concerns about your Care? No.  Actions: * If pain score is 4 or above: No action needed, pain <4.

## 2017-06-16 ENCOUNTER — Encounter: Payer: Self-pay | Admitting: Gastroenterology

## 2017-09-29 DIAGNOSIS — Z86010 Personal history of colonic polyps: Secondary | ICD-10-CM | POA: Insufficient documentation

## 2017-09-29 DIAGNOSIS — E669 Obesity, unspecified: Secondary | ICD-10-CM | POA: Insufficient documentation

## 2017-09-29 NOTE — Progress Notes (Signed)
Complete Physical  Assessment and Plan:  Diagnoses and all orders for this visit:  Encounter for routine adult health examination without abnormal findings  Gastroesophageal reflux disease with esophagitis Well managed on current medications Discussed diet, avoiding triggers and other lifestyle changes  Primary osteoarthritis involving multiple joints Takes diclofenac, primarily knees, doing ok   Recurrent major depressive disorder, in full remission ([place]) Continue medications - paxil Lifestyle discussed: diet/exerise, sleep hygiene, stress management, hydration  Chronic low back pain with left-sided sciatica, unspecified back pain laterality Takes diclofenac, flexeril and does fairly  Anemia, unspecified type Recheck CBC, iron levels, continue supplements If gets any worse will refer to hematology  Obesity (BMI 30.0-34.9) Long discussion about weight loss, diet, and exercise Recommended diet heavy in fruits and veggies and low in animal meats, cheeses, and dairy products, appropriate calorie intake Discussed appropriate weight for height  Follow up at next visit  History of colon polyps Repeat colonoscopy 2022 per GI  Anemia, unspecified type -     CBC with Differential/Platelet -     Iron,Total/Total Iron Binding Cap -     POC Hemoccult Bld/Stl (3-Cd Home Screen); Future  Obesity (BMI 30.0-34.9) -     Hemoglobin A1c  History of colon polyps -     POC Hemoccult Bld/Stl (3-Cd Home Screen); Future  Medication management -     CBC with Differential/Platelet -     COMPLETE METABOLIC PANEL WITH GFR -     Magnesium -     Urinalysis w microscopic + reflex cultur  Screening for hematuria or proteinuria -     Urinalysis w microscopic + reflex cultur -     Microalbumin / creatinine urine ratio  Vitamin D deficiency -     VITAMIN D 25 Hydroxy (Vit-D Deficiency, Fractures)  Screening cholesterol level -     Lipid panel -     diclofenac (VOLTAREN) 75 MG EC tablet;  Take 1 tablet (75 mg total) by mouth 2 (two) times daily.  Screening for thyroid disorder -     TSH  Screening for cardiovascular condition -     EKG 12-Lead  Need for vaccination against Streptococcus pneumoniae using pneumococcal conjugate vaccine 13 -     Pneumococcal conjugate vaccine 13-valent IM  Screening breast cancer  - mammogram ordered per patient request  Other orders -     PARoxetine (PAXIL) 20 MG tablet; Take 1 tablet (20 mg total) by mouth daily.  Discussed med's effects and SE's. Screening labs and tests as requested with regular follow-up as recommended. Over 40 minutes of exam, counseling, chart review, and complex, high level critical decision making was performed this visit.   Future Appointments  Date Time Provider [place]  03/31/2018 10:45 AM Vicie Mutters, PA-C [facility] None  10/03/2018  2:00 PM [name], NP [facility] None     HPI  77 y.o. female  presents for a complete physical and follow up for has DJD (degenerative joint disease); Chronic lower back pain; Depression; GERD (gastroesophageal reflux disease); Anemia; Obesity (BMI 30.0-34.9); History of colon polyps; and Prediabetes on their problem list.   She has been on paxil for depression for many years and doing well. She reports she has a rare resting tremor (thinks is essential tremor, sister has this) sometime when she is drinking coffee. Mostly doesn't bother her.   BMI is Body mass index is 31.57 kg/m., she has been working on diet, eating better living with daughter, very active taking care of grandchild but  not internationally exercising. Wt Readings from Last 3 Encounters:  09/30/17 220 lb (99.8 kg)  06/07/17 223 lb (101.2 kg)  05/11/17 223 lb 12.8 oz (101.5 kg)   Today their BP is BP: (!) 102/58 She does not workout. She denies chest pain, shortness of breath, dizziness.   Last GFR: Lab Results  Component Value Date   GFRNONAA 82 03/31/2017   Patient is on  Vitamin D supplement.   No results found for: VD25OH    Current Medications:  Current Outpatient Medications on File Prior to Visit  Medication Sig Dispense Refill  . acetaminophen (TYLENOL) 500 MG tablet Take 500 mg by mouth every 6 (six) hours as needed.    . Ascorbic Acid (VITAMIN C) 500 MG CAPS Take by mouth.    Marland Kitchen aspirin EC 81 MG tablet Take 81 mg by mouth daily.    . Cholecalciferol (VITAMIN D-3 PO) Take 1,000 Units by mouth daily.    . cyanocobalamin 1000 MCG tablet Take 1,000 mcg by mouth daily.    . cyclobenzaprine (FLEXERIL) 10 MG tablet Take 10 mg by mouth 3 (three) times daily as needed for muscle spasms.    . Ferrous Sulfate (IRON) 325 (65 Fe) MG TABS Take by mouth.    . lansoprazole (PREVACID) 15 MG capsule Take 15 mg by mouth daily at 12 noon.    . ranitidine (ZANTAC) 150 MG capsule Take 150 mg by mouth every evening.    . pilocarpine (PILOCAR) 1 % ophthalmic solution 1 drop 2 (two) times daily.     Current Facility-Administered Medications on File Prior to Visit  Medication Dose Route Frequency Provider Last Rate Last Dose  . 0.9 %  sodium chloride infusion  500 mL Intravenous Once Ladene Artist, MD       Allergies:  No Known Allergies Medical History:  She has DJD (degenerative joint disease); Chronic lower back pain; Depression; GERD (gastroesophageal reflux disease); Anemia; Obesity (BMI 30.0-34.9); History of colon polyps; and Prediabetes on their problem list. Health Maintenance:   Immunization History  Administered Date(s) Administered  . Influenza-Unspecified 12/09/2016   Tetanus: ? Declines today Pneumovax: has had remotely, due 1 years Prevnar 13: hasn't had, will give today Flu vaccine: 2018 Zostavax: has had  Pap: remote MGM: reports several years ago  DEXA: remote, normal  Colonoscopy: 05/2017 - 3 year follow up EGD: 05/2017  Last Dental Exam: Dr. Lynnette Caffey, last visit 2019, goes q39m Last Eye Exam: Dr.?, 12/2016, goes annually  Patient Care  Team: [name], MD as PCP - General (Internal Medicine)  Surgical History:  She has a past surgical history that includes Abdominal hysterectomy (03/1979); Breast biopsy (Left, 1986 and 1987); and Cataract extraction, bilateral (Bilateral, 2014). Family History:  Herfamily history includes Dementia in her brother; Heart disease in her father and mother; Hypertension in her daughter, father, and mother; Tremor in her sister. Social History:  She reports that she has never smoked. She has never used smokeless tobacco. She reports that she drinks about 1.8 oz of alcohol per week. She reports that she does not use drugs.  Review of Systems: Review of Systems  Constitutional: Negative for malaise/fatigue and weight loss.  HENT: Negative for hearing loss and tinnitus.   Eyes: Negative for blurred vision and double vision.  Respiratory: Negative for cough, sputum production, shortness of breath and wheezing.   Cardiovascular: Negative for chest pain, palpitations, orthopnea, claudication, leg swelling and PND.  Gastrointestinal: Negative for abdominal pain, blood in stool, constipation, diarrhea,  heartburn, melena, nausea and vomiting.  Genitourinary: Negative.   Musculoskeletal: Positive for back pain and joint pain (Knees). Negative for falls and myalgias.  Skin: Negative for rash.  Neurological: Positive for tremors (Rare intermittent, hands). Negative for dizziness, tingling, sensory change, weakness and headaches.  Endo/Heme/Allergies: Negative for polydipsia.  Psychiatric/Behavioral: Negative.  Negative for depression, memory loss, substance abuse and suicidal ideas. The patient is not nervous/anxious and does not have insomnia.   All other systems reviewed and are negative.   Physical Exam: Estimated body mass index is 31.57 kg/m as calculated from the following:   Height as of this encounter: 5\' 10"  (1.778 m).   Weight as of this encounter: 220 lb (99.8 kg). BP (!) 102/58    Pulse 78   Temp (!) 97.3 F (36.3 C)   Ht 5\' 10"  (1.778 m)   Wt 220 lb (99.8 kg)   SpO2 96%   BMI 31.57 kg/m  General Appearance: Well nourished, in no apparent distress.  Eyes: PERRLA, EOMs, conjunctiva no swelling or erythema, normal fundi and vessels.  Sinuses: No Frontal/maxillary tenderness  ENT/Mouth: Ext aud canals clear, normal light reflex with TMs without erythema, bulging. Good dentition. No erythema, swelling, or exudate on post pharynx. Tonsils not swollen or erythematous. Hearing normal.  Neck: Supple, thyroid normal. No bruits  Respiratory: Respiratory effort normal, BS equal bilaterally without rales, rhonchi, wheezing or stridor.  Cardio: RRR without murmurs, rubs or gallops. Brisk peripheral pulses without edema.  Chest: symmetric, with normal excursions and percussion.  Breasts: Symmetric, without lumps, nipple discharge, retractions.  Abdomen: Soft, nontender, no guarding, rebound, hernias, masses, or organomegaly.  Lymphatics: Non tender without lymphadenopathy.  Genitourinary: Defer Musculoskeletal: Full ROM all peripheral extremities,5/5 strength, and normal gait.  Skin: Warm, dry without rashes, lesions, ecchymosis. Neuro: Cranial nerves intact, reflexes equal bilaterally. Normal muscle tone, no cerebellar symptoms. Sensation intact.  Psych: Awake and oriented X 3, normal affect, Insight and Judgment appropriate.   EKG: WNL   Gorden Harms Cottrell Gentles 2:15 PM [facility]

## 2017-09-30 ENCOUNTER — Ambulatory Visit (INDEPENDENT_AMBULATORY_CARE_PROVIDER_SITE_OTHER): Payer: Medicare Other | Admitting: Adult Health

## 2017-09-30 ENCOUNTER — Encounter: Payer: Self-pay | Admitting: Adult Health

## 2017-09-30 VITALS — BP 102/58 | HR 78 | Temp 97.3°F | Ht 70.0 in | Wt 220.0 lb

## 2017-09-30 DIAGNOSIS — M5442 Lumbago with sciatica, left side: Secondary | ICD-10-CM

## 2017-09-30 DIAGNOSIS — D649 Anemia, unspecified: Secondary | ICD-10-CM

## 2017-09-30 DIAGNOSIS — K2100 Gastro-esophageal reflux disease with esophagitis, without bleeding: Secondary | ICD-10-CM

## 2017-09-30 DIAGNOSIS — Z1329 Encounter for screening for other suspected endocrine disorder: Secondary | ICD-10-CM | POA: Diagnosis not present

## 2017-09-30 DIAGNOSIS — Z13220 Encounter for screening for lipoid disorders: Secondary | ICD-10-CM

## 2017-09-30 DIAGNOSIS — R7303 Prediabetes: Secondary | ICD-10-CM

## 2017-09-30 DIAGNOSIS — Z1389 Encounter for screening for other disorder: Secondary | ICD-10-CM

## 2017-09-30 DIAGNOSIS — M150 Primary generalized (osteo)arthritis: Secondary | ICD-10-CM | POA: Diagnosis not present

## 2017-09-30 DIAGNOSIS — E559 Vitamin D deficiency, unspecified: Secondary | ICD-10-CM

## 2017-09-30 DIAGNOSIS — Z23 Encounter for immunization: Secondary | ICD-10-CM | POA: Diagnosis not present

## 2017-09-30 DIAGNOSIS — F3342 Major depressive disorder, recurrent, in full remission: Secondary | ICD-10-CM

## 2017-09-30 DIAGNOSIS — E669 Obesity, unspecified: Secondary | ICD-10-CM

## 2017-09-30 DIAGNOSIS — M159 Polyosteoarthritis, unspecified: Secondary | ICD-10-CM

## 2017-09-30 DIAGNOSIS — Z136 Encounter for screening for cardiovascular disorders: Secondary | ICD-10-CM | POA: Diagnosis not present

## 2017-09-30 DIAGNOSIS — Z1239 Encounter for other screening for malignant neoplasm of breast: Secondary | ICD-10-CM

## 2017-09-30 DIAGNOSIS — I10 Essential (primary) hypertension: Secondary | ICD-10-CM | POA: Diagnosis not present

## 2017-09-30 DIAGNOSIS — Z86010 Personal history of colonic polyps: Secondary | ICD-10-CM

## 2017-09-30 DIAGNOSIS — Z0000 Encounter for general adult medical examination without abnormal findings: Secondary | ICD-10-CM

## 2017-09-30 DIAGNOSIS — Z79899 Other long term (current) drug therapy: Secondary | ICD-10-CM

## 2017-09-30 DIAGNOSIS — R7309 Other abnormal glucose: Secondary | ICD-10-CM | POA: Insufficient documentation

## 2017-09-30 DIAGNOSIS — G8929 Other chronic pain: Secondary | ICD-10-CM

## 2017-09-30 MED ORDER — PAROXETINE HCL 20 MG PO TABS: 20.0000 mg | ORAL_TABLET | Freq: Every day | ORAL | 0 refills | Status: DC

## 2017-09-30 MED ORDER — DICLOFENAC SODIUM 75 MG PO TBEC: 75.0000 mg | DELAYED_RELEASE_TABLET | Freq: Two times a day (BID) | ORAL | 0 refills | Status: DC

## 2017-09-30 NOTE — Patient Instructions (Addendum)
Weight goal for next visit: 210 lb     Aim for 7+ servings of fruits and vegetables daily  80+ fluid ounces of water or unsweet tea for healthy kidneys   max 1 drink of alcohol per day, avoid smoking  Limit animal fats in diet for cholesterol and heart health - choose grass fed whenever available  Aim for low stress - take time to unwind and care for your mental health  Aim for 150 min of moderate intensity exercise weekly for heart health, and weights twice weekly for bone health  Aim for 7-9 hours of sleep daily      When it comes to diets, agreement about the perfect plan isn't easy to find, even among the experts. Experts at the [employer] developed an idea known as the Healthy Eating Plate. Just imagine a plate divided into logical, healthy portions.  The emphasis is on diet quality:  Load up on vegetables and fruits - one-half of your plate: Aim for color and variety, and remember that potatoes don't count.  Go for whole grains - one-quarter of your plate: Whole wheat, barley, wheat berries, quinoa, oats, brown rice, and foods made with them. If you want pasta, go with whole wheat pasta.  Protein power - one-quarter of your plate: Fish, chicken, beans, and nuts are all healthy, versatile protein sources. Limit red meat.  The diet, however, does go beyond the plate, offering a few other suggestions.  Use healthy plant oils, such as olive, canola, soy, corn, sunflower and peanut. Check the labels, and avoid partially hydrogenated oil, which have unhealthy trans fats.  If you're thirsty, drink water. Coffee and tea are good in moderation, but skip sugary drinks and limit milk and dairy products to one or two daily servings.  The type of carbohydrate in the diet is more important than the amount. Some sources of carbohydrates, such as vegetables, fruits, whole grains, and beans-are healthier than others.  Finally, stay active.

## 2017-10-01 ENCOUNTER — Encounter: Payer: Self-pay | Admitting: Adult Health

## 2017-10-01 DIAGNOSIS — E782 Mixed hyperlipidemia: Secondary | ICD-10-CM | POA: Insufficient documentation

## 2017-10-01 DIAGNOSIS — E785 Hyperlipidemia, unspecified: Secondary | ICD-10-CM

## 2017-10-01 LAB — COMPLETE METABOLIC PANEL WITH GFR
AG Ratio: 2 (calc) (ref 1.0–2.5)
ALT: 11 U/L (ref 6–29)
AST: 16 U/L (ref 10–35)
Albumin: 4.2 g/dL (ref 3.6–5.1)
Alkaline phosphatase (APISO): 56 U/L (ref 33–130)
BILIRUBIN TOTAL: 0.3 mg/dL (ref 0.2–1.2)
BUN: 20 mg/dL (ref 7–25)
CALCIUM: 9.6 mg/dL (ref 8.6–10.4)
CHLORIDE: 105 mmol/L (ref 98–110)
CO2: 31 mmol/L (ref 20–32)
Creat: 0.91 mg/dL (ref 0.60–0.93)
GFR, EST AFRICAN AMERICAN: 71 mL/min/{1.73_m2} (ref >=60)
GFR, EST NON AFRICAN AMERICAN: 61 mL/min/{1.73_m2} (ref >=60)
GLUCOSE: 102 mg/dL — AB (ref 65–99)
Globulin: 2.1 g/dL (calc) (ref 1.9–3.7)
Potassium: 4.5 mmol/L (ref 3.5–5.3)
Sodium: 143 mmol/L (ref 135–146)
TOTAL PROTEIN: 6.3 g/dL (ref 6.1–8.1)

## 2017-10-01 LAB — CBC WITH DIFFERENTIAL/PLATELET
BASOS PCT: 0.5 %
Basophils Absolute: 31 cells/uL (ref 0–200)
EOS ABS: 98 {cells}/uL (ref 15–500)
Eosinophils Relative: 1.6 %
HCT: 38.5 % (ref 35.0–45.0)
HEMOGLOBIN: 13.1 g/dL (ref 11.7–15.5)
Lymphs Abs: 1299 cells/uL (ref 850–3900)
MCH: 32.3 pg (ref 27.0–33.0)
MCHC: 34 g/dL (ref 32.0–36.0)
MCV: 94.8 fL (ref 80.0–100.0)
MPV: 11.6 fL (ref 7.5–12.5)
Monocytes Relative: 8.9 %
Neutro Abs: 4130 cells/uL (ref 1500–7800)
Neutrophils Relative %: 67.7 %
Platelets: 227 10*3/uL (ref 140–400)
RBC: 4.06 10*6/uL (ref 3.80–5.10)
RDW: 12.7 % (ref 11.0–15.0)
Total Lymphocyte: 21.3 %
WBC: 6.1 10*3/uL (ref 3.8–10.8)
WBCMIX: 543 {cells}/uL (ref 200–950)

## 2017-10-01 LAB — IRON, TOTAL/TOTAL IRON BINDING CAP
%SAT: 23 % (ref 16–45)
IRON: 75 ug/dL (ref 45–160)
TIBC: 333 mcg/dL (calc) (ref 250–450)

## 2017-10-01 LAB — LIPID PANEL
Cholesterol: 222 mg/dL — ABNORMAL HIGH
HDL: 67 mg/dL
LDL Cholesterol (Calc): 126 mg/dL — ABNORMAL HIGH
Non-HDL Cholesterol (Calc): 155 mg/dL — ABNORMAL HIGH
Total CHOL/HDL Ratio: 3.3 (calc)
Triglycerides: 177 mg/dL — ABNORMAL HIGH

## 2017-10-01 LAB — TSH: TSH: 3.83 mIU/L (ref 0.40–4.50)

## 2017-10-01 LAB — MAGNESIUM: Magnesium: 2.1 mg/dL (ref 1.5–2.5)

## 2017-10-01 LAB — VITAMIN D 25 HYDROXY (VIT D DEFICIENCY, FRACTURES): Vit D, 25-Hydroxy: 28 ng/mL — ABNORMAL LOW (ref 30–100)

## 2017-10-01 LAB — HEMOGLOBIN A1C
Hgb A1c MFr Bld: 5.3 % of total Hgb (ref <5.7)
Mean Plasma Glucose: 105 (calc)
eAG (mmol/L): 5.8 (calc)

## 2017-10-06 DIAGNOSIS — Z79899 Other long term (current) drug therapy: Secondary | ICD-10-CM | POA: Diagnosis not present

## 2017-10-06 DIAGNOSIS — Z1389 Encounter for screening for other disorder: Secondary | ICD-10-CM | POA: Diagnosis not present

## 2017-10-07 ENCOUNTER — Other Ambulatory Visit: Payer: Self-pay | Admitting: Adult Health

## 2017-10-07 DIAGNOSIS — Z1231 Encounter for screening mammogram for malignant neoplasm of breast: Secondary | ICD-10-CM

## 2017-10-07 MED ORDER — CIPROFLOXACIN HCL 500 MG PO TABS: 500.0000 mg | ORAL_TABLET | Freq: Two times a day (BID) | ORAL | 0 refills | Status: DC

## 2017-10-09 LAB — URINE CULTURE
MICRO NUMBER:: 90880997
SPECIMEN QUALITY:: ADEQUATE

## 2017-10-09 LAB — URINALYSIS W MICROSCOPIC + REFLEX CULTURE
Bilirubin Urine: NEGATIVE
GLUCOSE, UA: NEGATIVE
Hgb urine dipstick: NEGATIVE
KETONES UR: NEGATIVE
NITRITES URINE, INITIAL: POSITIVE — AB
PH: 7 (ref 5.0–8.0)
Protein, ur: NEGATIVE
SPECIFIC GRAVITY, URINE: 1.015 (ref 1.001–1.03)

## 2017-10-09 LAB — MICROALBUMIN / CREATININE URINE RATIO
Creatinine, Urine: 76 mg/dL (ref 20–275)
Microalb Creat Ratio: 7 mcg/mg creat (ref <30)
Microalb, Ur: 0.5 mg/dL

## 2017-10-09 LAB — CULTURE INDICATED

## 2017-11-09 ENCOUNTER — Ambulatory Visit: Payer: Medicare Other

## 2017-11-22 DIAGNOSIS — H02831 Dermatochalasis of right upper eyelid: Secondary | ICD-10-CM | POA: Diagnosis not present

## 2017-11-22 DIAGNOSIS — H02834 Dermatochalasis of left upper eyelid: Secondary | ICD-10-CM | POA: Diagnosis not present

## 2017-11-22 DIAGNOSIS — H353132 Nonexudative age-related macular degeneration, bilateral, intermediate dry stage: Secondary | ICD-10-CM | POA: Diagnosis not present

## 2017-11-22 DIAGNOSIS — H43813 Vitreous degeneration, bilateral: Secondary | ICD-10-CM | POA: Diagnosis not present

## 2017-11-22 DIAGNOSIS — H532 Diplopia: Secondary | ICD-10-CM | POA: Diagnosis not present

## 2017-12-10 ENCOUNTER — Ambulatory Visit
Admission: RE | Admit: 2017-12-10 | Discharge: 2017-12-10 | Disposition: A | Discharge status: Home / Self Care | Payer: Medicare Other | Source: Ambulatory Visit | Attending: Adult Health | Admitting: Adult Health

## 2017-12-10 DIAGNOSIS — Z1231 Encounter for screening mammogram for malignant neoplasm of breast: Secondary | ICD-10-CM | POA: Diagnosis not present

## 2017-12-27 ENCOUNTER — Other Ambulatory Visit: Payer: Self-pay | Admitting: Adult Health

## 2017-12-27 DIAGNOSIS — Z13220 Encounter for screening for lipoid disorders: Secondary | ICD-10-CM

## 2018-01-29 DIAGNOSIS — Z23 Encounter for immunization: Secondary | ICD-10-CM | POA: Diagnosis not present

## 2018-02-14 NOTE — Progress Notes (Deleted)
FOLLOW UP  Assessment and Plan:   Hypertension Well controlled with current medications  Monitor blood pressure at home; patient to call if consistently greater than 130/80 Continue DASH diet.   Reminder to go to the ER if any CP, SOB, nausea, dizziness, severe HA, changes vision/speech, left arm numbness and tingling and jaw pain.  Cholesterol Currently at goal;  Continue low cholesterol diet and exercise.  Check lipid panel.   Diabetes {with or without complications:30421263} Continue medication: Continue diet and exercise.  Perform daily foot/skin check, notify office of any concerning changes.  Check A1C  Obesity with co morbidities Long discussion about weight loss, diet, and exercise Recommended diet heavy in fruits and veggies and low in animal meats, cheeses, and dairy products, appropriate calorie intake Discussed ideal weight for height (below ***) and initial weight goal (***) Patient will work on *** Will follow up in 3 months  Vitamin D Def At goal at last visit; continue supplementation to maintain goal of 70-100 Defer Vit D level  Continue diet and meds as discussed. Further disposition pending results of labs. Discussed med's effects and SE's.   Over 30 minutes of exam, counseling, chart review, and critical decision making was performed.   Future Appointments  Date Time Provider [place]  02/17/2018 10:45 AM [name], NP [facility] None  03/31/2018 10:45 AM Vicie Mutters, PA-C [facility] None  10/03/2018  2:00 PM [name], NP [facility] None    ----------------------------------------------------------------------------------------------------------------------  HPI 77 y.o. female  presents for 6 month follow up on cholesterol, glucose management, weight and vitamin D deficiency.   BMI is There is no height or weight on file to calculate BMI., she {HAS HAS BJY:78295} been working on diet and exercise. Wt Readings from Last 3  Encounters:  09/30/17 220 lb (99.8 kg)  06/07/17 223 lb (101.2 kg)  05/11/17 223 lb 12.8 oz (101.5 kg)   Her blood pressure {HAS HAS NOT:18834} been controlled at home, today their BP is    She {DOES_DOES AOZ:30865} workout. She denies chest pain, shortness of breath, dizziness.   She is not on cholesterol medication denies myalgias. Her cholesterol is not at goal. The cholesterol last visit was:   Lab Results  Component Value Date   CHOL 222 (H) 09/30/2017   HDL 67 09/30/2017   LDLCALC 126 (H) 09/30/2017   TRIG 177 (H) 09/30/2017   CHOLHDL 3.3 09/30/2017    She {Has/has not:18111} been working on diet and exercise for glucose management, and denies {Symptoms; diabetes w/o none:19199}. Last A1C in the office was:  Lab Results  Component Value Date   HGBA1C 5.3 09/30/2017   Patient is *** on Vitamin D supplement.   Lab Results  Component Value Date   VD25OH 28 (L) 09/30/2017        Current Medications:  Current Outpatient Medications on File Prior to Visit  Medication Sig  . acetaminophen (TYLENOL) 500 MG tablet Take 500 mg by mouth every 6 (six) hours as needed.  . Ascorbic Acid (VITAMIN C) 500 MG CAPS Take by mouth.  Marland Kitchen aspirin EC 81 MG tablet Take 81 mg by mouth daily.  . Cholecalciferol (VITAMIN D-3 PO) Take 1,000 Units by mouth daily.  . cyanocobalamin 1000 MCG tablet Take 1,000 mcg by mouth daily.  . cyclobenzaprine (FLEXERIL) 10 MG tablet Take 10 mg by mouth 3 (three) times daily as needed for muscle spasms.  . diclofenac (VOLTAREN) 75 MG EC tablet TAKE 1 TABLET BY MOUTH TWO  TIMES DAILY  .  Ferrous Sulfate (IRON) 325 (65 Fe) MG TABS Take by mouth.  . lansoprazole (PREVACID) 15 MG capsule Take 15 mg by mouth daily at 12 noon.  Marland Kitchen PARoxetine (PAXIL) 20 MG tablet TAKE 1 TABLET BY MOUTH  DAILY  . pilocarpine (PILOCAR) 1 % ophthalmic solution 1 drop 2 (two) times daily.  . ranitidine (ZANTAC) 150 MG capsule Take 150 mg by mouth every evening.   Current  Facility-Administered Medications on File Prior to Visit  Medication  . 0.9 %  sodium chloride infusion     Allergies: No Known Allergies   Medical History:  Past Medical History:  Diagnosis Date  . Anxiety   . Chronic lower back pain   . Depression   . DJD (degenerative joint disease)    knees, neck  . GERD (gastroesophageal reflux disease)    has had dilitation in the past   Family history- Reviewed and unchanged Social history- Reviewed and unchanged   Review of Systems:  Review of Systems  Constitutional: Negative for malaise/fatigue and weight loss.  HENT: Negative for hearing loss and tinnitus.   Eyes: Negative for blurred vision and double vision.  Respiratory: Negative for cough, shortness of breath and wheezing.   Cardiovascular: Negative for chest pain, palpitations, orthopnea, claudication and leg swelling.  Gastrointestinal: Negative for abdominal pain, blood in stool, constipation, diarrhea, heartburn, melena, nausea and vomiting.  Genitourinary: Negative.   Musculoskeletal: Negative for joint pain and myalgias.  Skin: Negative for rash.  Neurological: Negative for dizziness, tingling, sensory change, weakness and headaches.  Endo/Heme/Allergies: Negative for polydipsia.  Psychiatric/Behavioral: Negative.   All other systems reviewed and are negative.     Physical Exam: There were no vitals taken for this visit. Wt Readings from Last 3 Encounters:  09/30/17 220 lb (99.8 kg)  06/07/17 223 lb (101.2 kg)  05/11/17 223 lb 12.8 oz (101.5 kg)   General Appearance: Well nourished, in no apparent distress. Eyes: PERRLA, EOMs, conjunctiva no swelling or erythema Sinuses: No Frontal/maxillary tenderness ENT/Mouth: Ext aud canals clear, TMs without erythema, bulging. No erythema, swelling, or exudate on post pharynx.  Tonsils not swollen or erythematous. Hearing normal.  Neck: Supple, thyroid normal.  Respiratory: Respiratory effort normal, BS equal bilaterally  without rales, rhonchi, wheezing or stridor.  Cardio: RRR with no MRGs. Brisk peripheral pulses without edema.  Abdomen: Soft, + BS.  Non tender, no guarding, rebound, hernias, masses. Lymphatics: Non tender without lymphadenopathy.  Musculoskeletal: Full ROM, 5/5 strength, {PSY - GAIT AND STATION:22860} gait Skin: Warm, dry without rashes, lesions, ecchymosis.  Neuro: Cranial nerves intact. No cerebellar symptoms.  Psych: Awake and oriented X 3, normal affect, Insight and Judgment appropriate.    Izora Ribas, NP 1:54 PM [facility]

## 2018-02-16 DIAGNOSIS — E559 Vitamin D deficiency, unspecified: Secondary | ICD-10-CM | POA: Insufficient documentation

## 2018-02-16 NOTE — Progress Notes (Signed)
FOLLOW UP  Assessment and Plan:   Cholesterol Currently above goal, mild elevations, defer medications in light of age, lifestyle discussed Continue low cholesterol diet and exercise.  Check lipid panel.   Abnormal glucose Recent A1Cs at goal Discussed diet/exercise, weight management  Defer A1C; check CMP  Obesity with co morbidities Long discussion about weight loss, diet, and exercise Recommended diet heavy in fruits and veggies and low in animal meats, cheeses, and dairy products, appropriate calorie intake Discussed ideal weight for height, she is reluctant to set weight loss goal Patient will work on [employer], decreasing junk food snacking, reducing portion sizes Will follow up in 3 months  Vitamin D Def Below goal at last visit; she has initiated supplementation continue supplementation to maintain goal of 70-100 Check Vit D level  Depression/anxiety Continue medications  Lifestyle discussed: diet/exerise, sleep hygiene, stress management, hydration  GERD Well managed on current medications, switch ranitidine to famotidine due to shortage Discussed diet, avoiding triggers and other lifestyle changes  Incontinent Has tried oxybutynin in the past which was initially helpful, has not tried myrbetriq, disucssed and 2 week samples provided, though benefit may be limited due to severity of symptoms S/p hysterectomy, prolapse is likely component, last pelvic remote secondary to age, new to this office   Continue diet and meds as discussed. Further disposition pending results of labs. Discussed med's effects and SE's.   Over 30 minutes of exam, counseling, chart review, and critical decision making was performed.   Future Appointments  Date Time Provider [place]  09/01/2018 11:00 AM [name], MD [facility] None  02/23/2019 11:15 AM [name], NP [facility] None     ----------------------------------------------------------------------------------------------------------------------  HPI 77 y.o. female  presents for 6 month follow up on cholesterol, glucose management, weight and vitamin D deficiency.   she has a diagnosis of depression/anxiety and has been on paxil 20 mg daily for many years, reports symptoms are well controlled on current regimen.   she has a diagnosis of GERD which is currently managed by prevacid 30 mg daily, and ranitidine 150 mg daily.  she reports symptoms is currently well controlled, and denies breakthrough reflux, burning in chest, hoarseness or cough.    BMI is Body mass index is 32.43 kg/m., she has not been working on diet and exercise. She keeps her great grandson; she admits to snacking on chips/dips, candy, chocolate. Her daughter does the cooking, but she could exercise.  Wt Readings from Last 3 Encounters:  02/17/18 226 lb (102.5 kg)  09/30/17 220 lb (99.8 kg)  06/07/17 223 lb (101.2 kg)   Today their BP is BP: 108/68  She does not workout. She denies chest pain, shortness of breath, dizziness.   She is not on cholesterol medication denies myalgias. Her cholesterol is not at goal. The cholesterol last visit was:   Lab Results  Component Value Date   CHOL 222 (H) 09/30/2017   HDL 67 09/30/2017   LDLCALC 126 (H) 09/30/2017   TRIG 177 (H) 09/30/2017   CHOLHDL 3.3 09/30/2017    She has not been working on diet and exercise for glucose management, and denies increased appetite, nausea, paresthesia of the feet, polydipsia, polyuria and visual disturbances. Last A1C in the office was:  Lab Results  Component Value Date   HGBA1C 5.3 09/30/2017   Patient is on Vitamin D supplement.   Lab Results  Component Value Date   VD25OH 28 (L) 09/30/2017       Current Medications:  Current  Outpatient Medications on File Prior to Visit  Medication Sig  . acetaminophen (TYLENOL) 500 MG tablet Take 500 mg by mouth  every 6 (six) hours as needed.  . Ascorbic Acid (VITAMIN C) 500 MG CAPS Take by mouth.  Marland Kitchen aspirin EC 81 MG tablet Take 81 mg by mouth daily.  . CHOLECALCIFEROL PO Take 5,000 Units by mouth daily.  . cyanocobalamin 1000 MCG tablet Take 1,000 mcg by mouth daily.  . diclofenac (VOLTAREN) 75 MG EC tablet TAKE 1 TABLET BY MOUTH TWO  TIMES DAILY  . famotidine (PEPCID) 20 MG tablet Take 20 mg by mouth 2 (two) times daily as needed.  . Ferrous Sulfate (IRON) 325 (65 Fe) MG TABS Take by mouth.  . lansoprazole (PREVACID) 15 MG capsule Take 30 mg by mouth daily at 12 noon.   Marland Kitchen PARoxetine (PAXIL) 20 MG tablet TAKE 1 TABLET BY MOUTH  DAILY  . pilocarpine (PILOCAR) 1 % ophthalmic solution 1 drop 2 (two) times daily.   Current Facility-Administered Medications on File Prior to Visit  Medication  . 0.9 %  sodium chloride infusion     Allergies: No Known Allergies   Medical History:  Past Medical History:  Diagnosis Date  . Anxiety   . Chronic lower back pain   . Depression   . DJD (degenerative joint disease)    knees, neck  . GERD (gastroesophageal reflux disease)    has had dilitation in the past   Family history- Reviewed and unchanged Social history- Reviewed and unchanged   Review of Systems:  Review of Systems  Constitutional: Negative for malaise/fatigue and weight loss.  HENT: Negative for hearing loss and tinnitus.   Eyes: Negative for blurred vision and double vision.  Respiratory: Negative for cough, shortness of breath and wheezing.   Cardiovascular: Negative for chest pain, palpitations, orthopnea, claudication and leg swelling.  Gastrointestinal: Negative for abdominal pain, blood in stool, constipation, diarrhea, heartburn, melena, nausea and vomiting.  Genitourinary: Negative.        Incontinent, wears pull ups   Musculoskeletal: Negative for joint pain and myalgias.  Skin: Negative for rash.  Neurological: Negative for dizziness, tingling, sensory change, weakness and  headaches.  Endo/Heme/Allergies: Negative for polydipsia.  Psychiatric/Behavioral: Negative.   All other systems reviewed and are negative.   Physical Exam: BP 108/68   Pulse 74   Temp (!) 97.3 F (36.3 C)   Ht 5\' 10"  (1.778 m)   Wt 226 lb (102.5 kg)   SpO2 96%   BMI 32.43 kg/m  Wt Readings from Last 3 Encounters:  02/17/18 226 lb (102.5 kg)  09/30/17 220 lb (99.8 kg)  06/07/17 223 lb (101.2 kg)   General Appearance: Well nourished, in no apparent distress. Eyes: PERRLA, EOMs, conjunctiva no swelling or erythema Sinuses: No Frontal/maxillary tenderness ENT/Mouth: Ext aud canals clear, TMs without erythema, bulging. No erythema, swelling, or exudate on post pharynx.  Tonsils not swollen or erythematous. Hearing normal.  Neck: Supple, thyroid normal.  Respiratory: Respiratory effort normal, BS equal bilaterally without rales, rhonchi, wheezing or stridor.  Cardio: RRR with no MRGs. Brisk peripheral pulses without edema.  Abdomen: Soft, + BS.  Non tender, no guarding, rebound, hernias, masses. Lymphatics: Non tender without lymphadenopathy.  Musculoskeletal: Full ROM, 5/5 strength, slow/steady gait Skin: Warm, dry without rashes, lesions, ecchymosis.  Neuro: Cranial nerves intact. No cerebellar symptoms.  Psych: Awake and oriented X 3, normal affect, Insight and Judgment appropriate.    Izora Ribas, NP 5:07 PM [facility]

## 2018-02-17 ENCOUNTER — Ambulatory Visit (INDEPENDENT_AMBULATORY_CARE_PROVIDER_SITE_OTHER): Payer: Medicare Other | Admitting: Adult Health

## 2018-02-17 ENCOUNTER — Ambulatory Visit: Payer: Self-pay | Admitting: Adult Health

## 2018-02-17 ENCOUNTER — Encounter: Payer: Self-pay | Admitting: Adult Health

## 2018-02-17 VITALS — BP 108/68 | HR 74 | Temp 97.3°F | Ht 70.0 in | Wt 226.0 lb

## 2018-02-17 DIAGNOSIS — E785 Hyperlipidemia, unspecified: Secondary | ICD-10-CM | POA: Diagnosis not present

## 2018-02-17 DIAGNOSIS — Z79899 Other long term (current) drug therapy: Secondary | ICD-10-CM | POA: Diagnosis not present

## 2018-02-17 DIAGNOSIS — E559 Vitamin D deficiency, unspecified: Secondary | ICD-10-CM

## 2018-02-17 DIAGNOSIS — E669 Obesity, unspecified: Secondary | ICD-10-CM | POA: Diagnosis not present

## 2018-02-17 DIAGNOSIS — N3945 Continuous leakage: Secondary | ICD-10-CM | POA: Diagnosis not present

## 2018-02-17 DIAGNOSIS — K2100 Gastro-esophageal reflux disease with esophagitis, without bleeding: Secondary | ICD-10-CM

## 2018-02-17 DIAGNOSIS — R7309 Other abnormal glucose: Secondary | ICD-10-CM

## 2018-02-17 DIAGNOSIS — F3342 Major depressive disorder, recurrent, in full remission: Secondary | ICD-10-CM | POA: Diagnosis not present

## 2018-02-17 MED ORDER — CYCLOBENZAPRINE HCL 10 MG PO TABS: 10.0000 mg | ORAL_TABLET | Freq: Three times a day (TID) | ORAL | 0 refills | Status: DC | PRN

## 2018-02-17 NOTE — Patient Instructions (Addendum)
Pick up famotidine 20 mg twice daily as needed for acid reflux   Goals    . DIET - INCREASE WATER INTAKE    . DIET - REDUCE FAST FOOD INTAKE    . DIET - REDUCE PORTION SIZE    . Exercise 3x per week (30 min per time)       Aim for 7+ servings of fruits and vegetables daily  65+ fluid ounces of water or unsweet tea for healthy kidneys  Limit to max 1 drink of alcohol per day; avoid smoking/tobacco  Limit animal fats in diet for cholesterol and heart health - choose grass fed whenever available  Avoid highly processed foods, and foods high in saturated/trans fats  Aim for low stress - take time to unwind and care for your mental health  Aim for 150 min of moderate intensity exercise weekly for heart health, and weights twice weekly for bone health  Aim for 7-9 hours of sleep daily   Urinary Incontinence Urinary incontinence is the involuntary loss of urine from your bladder. What are the causes? There are many causes of urinary incontinence. They include:  Medicines.  Infections.  Prostatic enlargement, leading to overflow of urine from your bladder.  Surgery.  Neurological diseases.  Emotional factors.  What are the signs or symptoms? Urinary Incontinence can be divided into four types: 1. Urge incontinence. Urge incontinence is the involuntary loss of urine before you have the opportunity to go to the bathroom. There is a sudden urge to void but not enough time to reach a bathroom. 2. Stress incontinence. Stress incontinence is the sudden loss of urine with any activity that forces urine to pass. It is commonly caused by anatomical changes to the pelvis and sphincter areas of your body. 3. Overflow incontinence. Overflow incontinence is the loss of urine from an obstructed opening to your bladder. This results in a backup of urine and a resultant buildup of pressure within the bladder. When the pressure within the bladder exceeds the closing pressure of the sphincter,  the urine overflows, which causes incontinence, similar to water overflowing a dam. 4. Total incontinence. Total incontinence is the loss of urine as a result of the inability to store urine within your bladder.  How is this diagnosed? Evaluating the cause of incontinence may require:  A thorough and complete medical and obstetric history.  A complete physical exam.  Laboratory tests such as a urine culture and sensitivities.  When additional tests are indicated, they can include:  An ultrasound exam.  Kidney and bladder X-rays.  Cystoscopy. This is an exam of the bladder using a narrow scope.  Urodynamic testing to test the nerve function to the bladder and sphincter areas.  How is this treated? Treatment for urinary incontinence depends on the cause:  For urge incontinence caused by a bacterial infection, antibiotics will be prescribed. If the urge incontinence is related to medicines you take, your health care provider may have you change the medicine.  For stress incontinence, surgery to re-establish anatomical support to the bladder or sphincter, or both, will often correct the condition.  For overflow incontinence caused by an enlarged prostate, an operation to open the channel through the enlarged prostate will allow the flow of urine out of the bladder. In women with fibroids, a hysterectomy may be recommended.  For total incontinence, surgery on your urinary sphincter may help. An artificial urinary sphincter (an inflatable cuff placed around the urethra) may be required. In women who have developed  a hole-like passage between their bladder and vagina (vesicovaginal fistula), surgery to close the fistula often is required.  Follow these instructions at home:  Normal daily hygiene and the use of pads or adult diapers that are changed regularly will help prevent odors and skin damage.  Avoid caffeine. It can overstimulate your bladder.  Use the bathroom regularly. Try  about every 2-3 hours to go to the bathroom, even if you do not feel the need to do so. Take time to empty your bladder completely. After urinating, wait a minute. Then try to urinate again.  For causes involving nerve dysfunction, keep a log of the medicines you take and a journal of the times you go to the bathroom. Contact a health care provider if:  You experience worsening of pain instead of improvement in pain after your procedure.  Your incontinence becomes worse instead of better. Get help right away if:  You experience fever or shaking chills.  You are unable to pass your urine.  You have redness spreading into your groin or down into your thighs. This information is not intended to replace advice given to you by your health care provider. Make sure you discuss any questions you have with your health care provider. Document Released: 04/09/2004 Document Revised: 10/11/2015 Document Reviewed: 08/09/2012 Elsevier Interactive Patient Education  2018 [place] extended-release tablets What is this medicine? MIRABEGRON (MIR a BEG ron) is used to treat overactive bladder. This medicine reduces the amount of bathroom visits. It may also help to control wetting accidents. This medicine may be used for other purposes; ask your health care provider or pharmacist if you have questions. COMMON BRAND NAME(S): Myrbetriq What should I tell my health care provider before I take this medicine? They need to know if you have any of these conditions: -difficulty passing urine -high blood pressure -kidney disease -liver disease -an unusual or allergic reaction to mirabegron, other medicines, foods, dyes, or preservatives -pregnant or trying to get pregnant -breast-feeding How should I use this medicine? Take this medicine by mouth with a glass of water. Follow the directions on the prescription label. Do not cut, crush or chew this medicine. You can take it with or  without food. If it upsets your stomach, take it with food. Take your medicine at regular intervals. Do not take it more often than directed. Do not stop taking except on your doctor's advice. Talk to your pediatrician regarding the use of this medicine in children. Special care may be needed. Overdosage: If you think you have taken too much of this medicine contact a poison control center or emergency room at once. NOTE: This medicine is only for you. Do not share this medicine with others. What if I miss a dose? If you miss a dose, take it as soon as you can. If it is almost time for your next dose, take only that dose. Do not take double or extra doses. What may interact with this medicine? -certain medicines for bladder problems like fesoterodine, oxybutynin, solifenacin, tolterodine -desipramine -digoxin -flecainide -ketoconazole -MAOIs like Carbex, Eldepryl, Marplan, Nardil, and Parnate -metoprolol -propafenone -thioridazine -warfarin This list may not describe all possible interactions. Give your health care provider a list of all the medicines, herbs, non-prescription drugs, or dietary supplements you use. Also tell them if you smoke, drink alcohol, or use illegal drugs. Some items may interact with your medicine. What should I watch for while using this medicine? It may take 8  weeks to notice the full benefit from this medicine. You may need to limit your intake tea, coffee, caffeinated sodas, and alcohol. These drinks may make your symptoms worse. Visit your doctor or health care professional for regular checks on your progress. Check your blood pressure as directed. Ask your doctor or health care professional what your blood pressure should be and when you should contact him or her. What side effects may I notice from receiving this medicine? Side effects that you should report to your doctor or health care professional as soon as possible: -allergic reactions like skin rash,  itching or hives, swelling of the face, lips, or tongue -chest pain or palpitations -severe or sudden headache -high blood pressure -fast, irregular heartbeat -redness, blistering, peeling or loosening of the skin, including inside the mouth -signs of infection like fever or chills; cough; sore throat; pain or difficulty passing urine -trouble passing urine or change in the amount of urine Side effects that usually do not require medical attention (report to your doctor or health care professional if they continue or are bothersome): -constipation -diarrhea -dizziness -dry eyes -joint pain -mild headache -nausea -runny nose This list may not describe all possible side effects. Call your doctor for medical advice about side effects. You may report side effects to FDA at 1-800-FDA-1088. Where should I keep my medicine? Keep out of the reach of children. Store at room temperature between 15 and 30 degrees C (59 and 86 degrees F). Throw away any unused medicine after the expiration date. NOTE: This sheet is a summary. It may not cover all possible information. If you have questions about this medicine, talk to your doctor, pharmacist, or health care provider.  2018 Elsevier/Gold Standard (2015-04-04 12:14:30)

## 2018-02-18 LAB — CBC WITH DIFFERENTIAL/PLATELET
BASOS PCT: 0.6 %
Basophils Absolute: 38 cells/uL (ref 0–200)
EOS ABS: 107 {cells}/uL (ref 15–500)
Eosinophils Relative: 1.7 %
HEMATOCRIT: 35.8 % (ref 35.0–45.0)
HEMOGLOBIN: 12.1 g/dL (ref 11.7–15.5)
LYMPHS ABS: 1739 {cells}/uL (ref 850–3900)
MCH: 32.1 pg (ref 27.0–33.0)
MCHC: 33.8 g/dL (ref 32.0–36.0)
MCV: 95 fL (ref 80.0–100.0)
MONOS PCT: 9.4 %
MPV: 11.7 fL (ref 7.5–12.5)
NEUTROS ABS: 3824 {cells}/uL (ref 1500–7800)
Neutrophils Relative %: 60.7 %
Platelets: 213 10*3/uL (ref 140–400)
RBC: 3.77 10*6/uL — ABNORMAL LOW (ref 3.80–5.10)
RDW: 12.2 % (ref 11.0–15.0)
Total Lymphocyte: 27.6 %
WBC: 6.3 10*3/uL (ref 3.8–10.8)
WBCMIX: 592 {cells}/uL (ref 200–950)

## 2018-02-18 LAB — COMPLETE METABOLIC PANEL WITH GFR
AG RATIO: 1.9 (calc) (ref 1.0–2.5)
ALT: 13 U/L (ref 6–29)
AST: 15 U/L (ref 10–35)
Albumin: 3.8 g/dL (ref 3.6–5.1)
Alkaline phosphatase (APISO): 56 U/L (ref 33–130)
BILIRUBIN TOTAL: 0.4 mg/dL (ref 0.2–1.2)
BUN: 22 mg/dL (ref 7–25)
CO2: 29 mmol/L (ref 20–32)
Calcium: 9.5 mg/dL (ref 8.6–10.4)
Chloride: 105 mmol/L (ref 98–110)
Creat: 0.79 mg/dL (ref 0.60–0.93)
GFR, EST AFRICAN AMERICAN: 83 mL/min/{1.73_m2} (ref >=60)
GFR, EST NON AFRICAN AMERICAN: 72 mL/min/{1.73_m2} (ref >=60)
GLOBULIN: 2 g/dL (ref 1.9–3.7)
Glucose, Bld: 101 mg/dL — ABNORMAL HIGH (ref 65–99)
POTASSIUM: 4.4 mmol/L (ref 3.5–5.3)
Sodium: 143 mmol/L (ref 135–146)
Total Protein: 5.8 g/dL — ABNORMAL LOW (ref 6.1–8.1)

## 2018-02-18 LAB — LIPID PANEL
Cholesterol: 202 mg/dL — ABNORMAL HIGH (ref <200)
HDL: 63 mg/dL (ref >50)
LDL Cholesterol (Calc): 115 mg/dL (calc) — ABNORMAL HIGH
NON-HDL CHOLESTEROL (CALC): 139 mg/dL — AB (ref <130)
Total CHOL/HDL Ratio: 3.2 (calc) (ref <5.0)
Triglycerides: 126 mg/dL (ref <150)

## 2018-02-18 LAB — VITAMIN D 25 HYDROXY (VIT D DEFICIENCY, FRACTURES): Vit D, 25-Hydroxy: 41 ng/mL (ref 30–100)

## 2018-02-18 LAB — TSH: TSH: 2.81 mIU/L (ref 0.40–4.50)

## 2018-02-18 LAB — MAGNESIUM: Magnesium: 1.9 mg/dL (ref 1.5–2.5)

## 2018-03-29 NOTE — Progress Notes (Signed)
MEDICARE WELLNESS  Assessment and Plan:  Gastroesophageal reflux disease with esophagitis Well managed on current medications Discussed diet, avoiding triggers and other lifestyle changes - try to cut back on diclofenac, may benefit from knee injection- declines at this time  Primary osteoarthritis involving multiple joints Takes diclofenac, primarily knees, doing ok- may benefit from injection  Recurrent major depressive disorder, in full remission ([place]) Continue medications - paxil Lifestyle discussed: diet/exerise, sleep hygiene, stress management, hydration  Chronic low back pain with left-sided sciatica, unspecified back pain laterality Takes diclofenac, flexeril - monitor  Anemia, unspecified type Continue to monitor If gets any worse will refer to hematology  Obesity (BMI 30.0-34.9) Long discussion about weight loss, diet, and exercise Recommended diet heavy in fruits and veggies and low in animal meats, cheeses, and dairy products, appropriate calorie intake Discussed appropriate weight for height  Follow up at next visit  History of colon polyps Repeat colonoscopy 2022 per GI  Anemia, unspecified type Monitor  History of colon polyps -     Due 2022  Vitamin D deficiency -     Continue supplement   Discussed med's effects and SE's. Screening labs and tests as requested with regular follow-up as recommended. Over 30 minutes of exam, counseling, chart review, and complex, high level critical decision making was performed this visit.   Future Appointments  Date Time Provider Protivin  09/01/2018 11:00 AM [name], MD [facility] None  02/23/2019 11:15 AM [name], NP [facility] None    Plan:   During the course of the visit the patient was educated and counseled about appropriate screening and preventive services including:    Pneumococcal vaccine   Prevnar 13  Influenza vaccine  Td vaccine  Screening electrocardiogram  Bone  densitometry screening  Colorectal cancer screening  Diabetes screening  Glaucoma screening  Nutrition counseling   Advanced directives: requested    HPI  78 y.o. female  presents for a wellness and follow up for has DJD (degenerative joint disease); Chronic lower back pain; Depression; GERD (gastroesophageal reflux disease); Obesity (BMI 30.0-34.9); History of colon polyps; Other abnormal glucose; Hyperlipidemia; Vitamin D deficiency; and Continuous leakage of urine on their problem list.   She has been on paxil for depression for many years and doing well.   BMI is Body mass index is 31.48 kg/m., she has been working on diet, eating better living with daughter. She has incontinence, has tried oxybutynin and myrbetriq that did not help.  Wt Readings from Last 3 Encounters:  03/31/18 219 lb 6.4 oz (99.5 kg)  02/17/18 226 lb (102.5 kg)  09/30/17 220 lb (99.8 kg)   Today their BP is BP: 128/74 She does not workout. She denies chest pain, shortness of breath, dizziness.   Last GFR: Lab Results  Component Value Date   GFRNONAA 72 02/17/2018   Patient is on Vitamin D supplement.   Lab Results  Component Value Date   VD25OH 41 02/17/2018     Lab Results  Component Value Date   HGBA1C 5.3 09/30/2017   Lab Results  Component Value Date   CHOL 202 (H) 02/17/2018   HDL 63 02/17/2018   LDLCALC 115 (H) 02/17/2018   TRIG 126 02/17/2018   CHOLHDL 3.2 02/17/2018     Current Medications:       Current Outpatient Medications (Respiratory):  .  benzonatate (TESSALON) 200 MG capsule, Take 1 capsule (200 mg total) by mouth 3 (three) times daily as needed for cough (Max: 600mg  per day).  Current Outpatient Medications (Analgesics):  .  acetaminophen (TYLENOL) 500 MG tablet, Take 500 mg by mouth every 6 (six) hours as needed. Marland Kitchen  aspirin EC 81 MG tablet, Take 81 mg by mouth daily. .  diclofenac (VOLTAREN) 75 MG EC tablet, TAKE 1 TABLET BY MOUTH TWO  TIMES  DAILY   Current Outpatient Medications (Hematological):  .  cyanocobalamin 1000 MCG tablet, Take 1,000 mcg by mouth daily.   Current Outpatient Medications (Other):  Marland Kitchen  Ascorbic Acid (VITAMIN C) 500 MG CAPS, Take by mouth. .  CHOLECALCIFEROL PO, Take 5,000 Units by mouth daily. .  cyclobenzaprine (FLEXERIL) 10 MG tablet, Take 1 tablet (10 mg total) by mouth 3 (three) times daily as needed for muscle spasms. .  famotidine (PEPCID) 20 MG tablet, Take 20 mg by mouth 2 (two) times daily as needed. .  lansoprazole (PREVACID) 15 MG capsule, Take 30 mg by mouth daily at 12 noon.  Marland Kitchen  PARoxetine (PAXIL) 20 MG tablet, TAKE 1 TABLET BY MOUTH  DAILY .  pilocarpine (PILOCAR) 1 % ophthalmic solution, 1 drop 2 (two) times daily.  Current Facility-Administered Medications (Other):  .  0.9 %  sodium chloride infusion  Allergies:  No Known Allergies   Medical History:  She has DJD (degenerative joint disease); Chronic lower back pain; Depression; GERD (gastroesophageal reflux disease); Obesity (BMI 30.0-34.9); History of colon polyps; Other abnormal glucose; Hyperlipidemia; Vitamin D deficiency; and Continuous leakage of urine on their problem list.   Health Maintenance:   Immunization History  Administered Date(s) Administered  . Influenza, High Dose Seasonal PF 01/29/2018  . Influenza-Unspecified 12/09/2016  . Pneumococcal Conjugate-13 09/30/2017   Tetanus: ? Declines today Pneumovax: due 09/2018 Prevnar 13: 2019 Flu vaccine: 2019 Zostavax:   Pap: remote MGM: 11/2017  DEXA: remote, normal declines Colonoscopy: 05/2017 - 3 year follow up EGD: 05/2017 CXR 03/2017  Last Dental Exam: Dr. Lynnette Caffey, last visit 2019, goes q48m Last Eye Exam: Dr.?, 12/2016, goes annually  Patient Care Team: [name], MD as PCP - General (Internal Medicine)  Surgical History:  She has a past surgical history that includes Abdominal hysterectomy (03/1979); Cataract extraction, bilateral (Bilateral,  2014); and Breast biopsy (Left, 1986 and 1987). Family History:  Herfamily history includes Dementia in her brother; Heart disease in her father and mother; Hypertension in her daughter, father, and mother; Tremor in her sister. Social History:  She reports that she has never smoked. She has never used smokeless tobacco. She reports current alcohol use of about 3.0 standard drinks of alcohol per week. She reports that she does not use drugs.  MEDICARE WELLNESS OBJECTIVES: Physical activity: Current Exercise Habits: The patient does not participate in regular exercise at present(does some housework) Cardiac risk factors: Cardiac Risk Factors include: advanced age (>75men, >32 women);obesity (BMI >30kg/m2);hypertension;dyslipidemia Depression/mood screen:   Depression screen [employer] 2/9 03/31/2018  Decreased Interest 0  Down, Depressed, Hopeless 0  PHQ - 2 Score 0    ADLs:  In your present state of health, do you have any difficulty performing the following activities: 03/31/2018 03/31/2017  Hearing? N N  Vision? N N  Difficulty concentrating or making decisions? Y N  Walking or climbing stairs? Y Y  Comment - due to ortho  Dressing or bathing? N N  Doing errands, shopping? Tempie Donning  Comment daughter drives her daughter is driving her  Conservation officer, nature and eating ? N N  Using the Toilet? N N  In the past six months, have you accidently leaked urine?  Y Y  Do you have problems with loss of bowel control? N Y  Managing your Medications? N N  Managing your Finances? Y N  Housekeeping or managing your Housekeeping? N N  Some recent data might be hidden     Cognitive Testing  Alert? Yes  Normal Appearance?Yes  Oriented to person? Yes  Place? Yes   Time? Yes  Recall of three objects?  Yes  Can perform simple calculations? Yes  Displays appropriate judgment?Yes  Can read the correct time from a watch face?Yes  EOL planning: Does Patient Have a Medical Advance Directive?: No Would patient like  information on creating a medical advance directive?: Yes (MAU/Ambulatory/Procedural Areas - Information given)    Review of Systems: Review of Systems  Constitutional: Negative for malaise/fatigue and weight loss.  HENT: Negative for hearing loss and tinnitus.   Eyes: Negative for blurred vision and double vision.  Respiratory: Negative for cough, sputum production, shortness of breath and wheezing.   Cardiovascular: Negative for chest pain, palpitations, orthopnea, claudication, leg swelling and PND.  Gastrointestinal: Negative for abdominal pain, blood in stool, constipation, diarrhea, heartburn, melena, nausea and vomiting.  Genitourinary: Negative.   Musculoskeletal: Positive for back pain and joint pain (Knees). Negative for falls and myalgias.  Skin: Negative for rash.  Neurological: Positive for tremors (Rare intermittent, hands). Negative for dizziness, tingling, sensory change, weakness and headaches.  Endo/Heme/Allergies: Negative for polydipsia.  Psychiatric/Behavioral: Negative.  Negative for depression, memory loss, substance abuse and suicidal ideas. The patient is not nervous/anxious and does not have insomnia.   All other systems reviewed and are negative.   Physical Exam: Estimated body mass index is 31.48 kg/m as calculated from the following:   Height as of this encounter: 5\' 10"  (1.778 m).   Weight as of this encounter: 219 lb 6.4 oz (99.5 kg). BP 128/74   Pulse 68   Temp (!) 97.3 F (36.3 C)   Ht 5\' 10"  (1.778 m)   Wt 219 lb 6.4 oz (99.5 kg)   SpO2 98%   BMI 31.48 kg/m  General Appearance: Well nourished, in no apparent distress.  Eyes: PERRLA, EOMs, conjunctiva no swelling or erythema, normal fundi and vessels.  Sinuses: No Frontal/maxillary tenderness  ENT/Mouth: Ext aud canals clear, normal light reflex with TMs without erythema, bulging. Good dentition. No erythema, swelling, or exudate on post pharynx. Tonsils not swollen or erythematous. Hearing  normal.  Neck: Supple, thyroid normal. No bruits  Respiratory: Respiratory effort normal, BS equal bilaterally without rales, rhonchi, wheezing or stridor.  Cardio: RRR without murmurs, rubs or gallops. Brisk peripheral pulses without edema.  Chest: symmetric, with normal excursions and percussion.  Breasts: Symmetric, without lumps, nipple discharge, retractions.  Abdomen: Soft, nontender, no guarding, rebound, hernias, masses, or organomegaly.  Lymphatics: Non tender without lymphadenopathy.  Genitourinary: Defer Musculoskeletal: Full ROM all peripheral extremities,5/5 strength, and normal gait.  Skin: Warm, dry without rashes, lesions, ecchymosis. Neuro: Cranial nerves intact, reflexes equal bilaterally. Normal muscle tone, no cerebellar symptoms. Sensation intact.  Psych: Awake and oriented X 3, normal affect, Insight and Judgment appropriate.    Medicare Attestation I have personally reviewed: The patient's medical and social history Their use of alcohol, tobacco or illicit drugs Their current medications and supplements The patient's functional ability including ADLs,fall risks, home safety risks, cognitive, and hearing and visual impairment Diet and physical activities Evidence for depression or mood disorders  The patient's weight, height, BMI, and visual acuity have been recorded in the chart.  I have made referrals, counseling, and provided education to the patient based on review of the above and I have provided the patient with a written personalized care plan for preventive services.     Vicie Mutters 1:08 PM [facility]

## 2018-03-31 ENCOUNTER — Encounter: Payer: Self-pay | Admitting: Physician Assistant

## 2018-03-31 ENCOUNTER — Ambulatory Visit: Payer: Self-pay | Admitting: Physician Assistant

## 2018-03-31 ENCOUNTER — Ambulatory Visit (INDEPENDENT_AMBULATORY_CARE_PROVIDER_SITE_OTHER): Payer: Medicare Other | Admitting: Physician Assistant

## 2018-03-31 VITALS — BP 128/74 | HR 68 | Temp 97.3°F | Ht 70.0 in | Wt 219.4 lb

## 2018-03-31 DIAGNOSIS — N3945 Continuous leakage: Secondary | ICD-10-CM

## 2018-03-31 DIAGNOSIS — R6889 Other general symptoms and signs: Secondary | ICD-10-CM | POA: Diagnosis not present

## 2018-03-31 DIAGNOSIS — Z0000 Encounter for general adult medical examination without abnormal findings: Secondary | ICD-10-CM

## 2018-03-31 DIAGNOSIS — Z0001 Encounter for general adult medical examination with abnormal findings: Secondary | ICD-10-CM | POA: Diagnosis not present

## 2018-03-31 DIAGNOSIS — M150 Primary generalized (osteo)arthritis: Secondary | ICD-10-CM

## 2018-03-31 DIAGNOSIS — E669 Obesity, unspecified: Secondary | ICD-10-CM

## 2018-03-31 DIAGNOSIS — K2100 Gastro-esophageal reflux disease with esophagitis, without bleeding: Secondary | ICD-10-CM

## 2018-03-31 DIAGNOSIS — R7309 Other abnormal glucose: Secondary | ICD-10-CM | POA: Diagnosis not present

## 2018-03-31 DIAGNOSIS — E559 Vitamin D deficiency, unspecified: Secondary | ICD-10-CM

## 2018-03-31 DIAGNOSIS — Z86010 Personal history of colonic polyps: Secondary | ICD-10-CM | POA: Diagnosis not present

## 2018-03-31 DIAGNOSIS — E785 Hyperlipidemia, unspecified: Secondary | ICD-10-CM

## 2018-03-31 DIAGNOSIS — F3342 Major depressive disorder, recurrent, in full remission: Secondary | ICD-10-CM | POA: Diagnosis not present

## 2018-03-31 DIAGNOSIS — M159 Polyosteoarthritis, unspecified: Secondary | ICD-10-CM

## 2018-03-31 MED ORDER — BENZONATATE 200 MG PO CAPS: 200.0000 mg | ORAL_CAPSULE | Freq: Three times a day (TID) | ORAL | 0 refills | Status: DC | PRN

## 2018-03-31 NOTE — Patient Instructions (Addendum)
Add on magnesium 400-500mg  a day to help with your bowel movements, can do 1-2 a day.  If this does not help you bowel movements, can try miralax We can refer you to ortho for your knee to get an infection and see if we can cut back on diclofenac use.    WOMEN AND HEART ATTACKS  Being a woman you may not have the typical symptoms of a heart attack.  You may not have any pain OR you may have atypical pain such as jaw pain, upper back pain, arm pain, "my bra feels to tight" and you will often have symptoms with it like below.  Symptoms for a heart attack will likely occur when you exert your self or exercise and include: Shortness of breath Sweating Nausea Dizziness Fast or irregular heart beats Fatigue   It makes me feel better if my patients get their heart rate up with exercise once or twice a week and pay close attention to your body. If there is ANY change in your exercise capacity or if you have symptoms above, please STOP and call 911 or call to come to the office.   Here is some information to help you keep your heart healthy: Move it! - Aim for 30 mins of activity every day. Take it slowly at first. Talk to [place] before starting any new exercise program.   Lose it.  -Body Mass Index (BMI) can indicate if you need to lose weight. A healthy range is 18.5-24.9. For a BMI calculator, go to [email or web address]  Waist Management -Excess abdominal fat is a risk factor for heart disease, diabetes, asthma, stroke and more. Ideal waist circumference is less than 35" for women and less than 40" for men.   Eat Right -focus on fruits, vegetables, whole grains, and meals you make yourself. Avoid foods with trans fat and high sugar/sodium content.   Snooze or Snore? - Loud snoring can be a sign of sleep apnea, a significant risk factor for high blood pressure, heart attach, stroke, and heart arrhythmias.  Kick the habit -Quit Smoking! Avoid second hand smoke. A single cigarette raises your blood  pressure for 20 mins and increases the risk of heart attack and stroke for the next 24 hours.   Are Aspirin and Supplements right for you? -Add ENTERIC COATED low dose 81 mg Aspirin daily OR can do every other day if you have easy bruising to protect your heart and head. As well as to reduce risk of Colon Cancer by 20 %, Skin Cancer by 26 % , Melanoma by 46% and Pancreatic cancer by 60%  Say "No to Stress -There may be little you can do about problems that cause stress. However, techniques such as long walks, meditation, and exercise can help you manage it.   Start Now! - Make changes one at a time and set reasonable goals to increase your likelihood of success.        When it comes to diets, agreement about the perfect plan isn't easy to find, even among the experts. Experts at the [employer] developed an idea known as the Healthy Eating Plate. Just imagine a plate divided into logical, healthy portions.  The emphasis is on diet quality:  Load up on vegetables and fruits - one-half of your plate: Aim for color and variety, and remember that potatoes don't count.  Go for whole grains - one-quarter of your plate: Whole wheat, barley, wheat berries, quinoa, oats, brown rice, and foods  made with them. If you want pasta, go with whole wheat pasta.  Protein power - one-quarter of your plate: Fish, chicken, beans, and nuts are all healthy, versatile protein sources. Limit red meat.  The diet, however, does go beyond the plate, offering a few other suggestions.  Use healthy plant oils, such as olive, canola, soy, corn, sunflower and peanut. Check the labels, and avoid partially hydrogenated oil, which have unhealthy trans fats.  If you're thirsty, drink water. Coffee and tea are good in moderation, but skip sugary drinks and limit milk and dairy products to one or two daily servings.  The type of carbohydrate in the diet is more important than the amount. Some sources of  carbohydrates, such as vegetables, fruits, whole grains, and beans-are healthier than others.  Finally, stay active.

## 2018-05-10 ENCOUNTER — Other Ambulatory Visit: Payer: Self-pay | Admitting: Adult Health

## 2018-05-10 DIAGNOSIS — Z13220 Encounter for screening for lipoid disorders: Secondary | ICD-10-CM

## 2018-06-23 ENCOUNTER — Other Ambulatory Visit: Payer: Self-pay | Admitting: Adult Health

## 2018-06-23 DIAGNOSIS — Z1231 Encounter for screening mammogram for malignant neoplasm of breast: Secondary | ICD-10-CM

## 2018-07-11 ENCOUNTER — Other Ambulatory Visit: Payer: Self-pay | Admitting: Adult Health

## 2018-07-11 DIAGNOSIS — Z13220 Encounter for screening for lipoid disorders: Secondary | ICD-10-CM

## 2018-07-24 ENCOUNTER — Other Ambulatory Visit: Payer: Self-pay | Admitting: Adult Health

## 2018-09-01 ENCOUNTER — Encounter: Payer: Self-pay | Admitting: Internal Medicine

## 2018-09-01 ENCOUNTER — Ambulatory Visit (INDEPENDENT_AMBULATORY_CARE_PROVIDER_SITE_OTHER): Payer: Medicare Other | Admitting: Internal Medicine

## 2018-09-01 ENCOUNTER — Other Ambulatory Visit: Payer: Self-pay

## 2018-09-01 ENCOUNTER — Other Ambulatory Visit: Payer: Self-pay | Admitting: *Deleted

## 2018-09-01 VITALS — BP 128/76 | HR 72 | Temp 97.4°F | Resp 16 | Ht 69.5 in | Wt 219.4 lb

## 2018-09-01 DIAGNOSIS — Z136 Encounter for screening for cardiovascular disorders: Secondary | ICD-10-CM

## 2018-09-01 DIAGNOSIS — K2100 Gastro-esophageal reflux disease with esophagitis, without bleeding: Secondary | ICD-10-CM

## 2018-09-01 DIAGNOSIS — D509 Iron deficiency anemia, unspecified: Secondary | ICD-10-CM | POA: Diagnosis not present

## 2018-09-01 DIAGNOSIS — R030 Elevated blood-pressure reading, without diagnosis of hypertension: Secondary | ICD-10-CM

## 2018-09-01 DIAGNOSIS — Z8249 Family history of ischemic heart disease and other diseases of the circulatory system: Secondary | ICD-10-CM | POA: Diagnosis not present

## 2018-09-01 DIAGNOSIS — R7309 Other abnormal glucose: Secondary | ICD-10-CM

## 2018-09-01 DIAGNOSIS — E559 Vitamin D deficiency, unspecified: Secondary | ICD-10-CM | POA: Diagnosis not present

## 2018-09-01 DIAGNOSIS — Z86010 Personal history of colonic polyps: Secondary | ICD-10-CM | POA: Diagnosis not present

## 2018-09-01 DIAGNOSIS — G250 Essential tremor: Secondary | ICD-10-CM

## 2018-09-01 DIAGNOSIS — E782 Mixed hyperlipidemia: Secondary | ICD-10-CM

## 2018-09-01 DIAGNOSIS — K222 Esophageal obstruction: Secondary | ICD-10-CM | POA: Diagnosis not present

## 2018-09-01 DIAGNOSIS — Z13220 Encounter for screening for lipoid disorders: Secondary | ICD-10-CM

## 2018-09-01 DIAGNOSIS — Z79899 Other long term (current) drug therapy: Secondary | ICD-10-CM | POA: Diagnosis not present

## 2018-09-01 MED ORDER — DICLOFENAC SODIUM 75 MG PO TBEC: 75.0000 mg | DELAYED_RELEASE_TABLET | Freq: Two times a day (BID) | ORAL | 1 refills | Status: DC

## 2018-09-01 NOTE — Patient Instructions (Signed)

## 2018-09-01 NOTE — Progress Notes (Signed)
[name] D[name], M.D.     Darlene [name]. Darlene [name], P.A.-C Darlene Comber, DNP _______________________________________________ [facility] [address] [name] [address] Telephone [phone] Telefax [phone]  Comprehensive Evaluation &  Examination     This very nice 78 y.o. WWF (mother of Darlene [name]) who presents for a  comprehensive evaluation and management of multiple medical co-morbidities.  Patient has been followed for HTN, HLD, Prediabetes  and Vitamin D Deficiency.      Patient is being followed expectantly for elevated BP. Patient reports BP has been controlled at home and patient denies any cardiac symptoms as chest pain, palpitations, shortness of breath, dizziness or ankle swelling. Today's BP is at goal - 128/76.      Patient's hyperlipidemia is not controlled with diet and medications. Patient denies myalgias or other medication SE's. Current  lipids are not at goal: Lab Results  Component Value Date   CHOL 220 (H) 09/01/2018   HDL 71 09/01/2018   LDLCALC 134 (H) 09/01/2018   TRIG 62 09/01/2018   CHOLHDL 3.1 09/01/2018      Patient has moderate Obesity (BMI 31.9+) and is monitored expectantly for glucose intolerance and patient denies reactive hypoglycemic symptoms, visual blurring, diabetic polys or paresthesias. Current A1c is Normal & at goal: Lab Results  Component Value Date   HGBA1C 5.4 09/01/2018      Finally, patient has history of Vitamin D Deficiency ("28" / July 2019)  and current  Vitamin D is at goal: Lab Results  Component Value Date   VD25OH 61 09/01/2018   Current Outpatient Medications on File Prior to Visit  Medication Sig  . acetaminophen (TYLENOL) 500 MG tablet Take 500 mg by mouth every 6 (six) hours as needed.  . Ascorbic Acid (VITAMIN C) 500 MG CAPS Take by mouth.  Marland Kitchen aspirin EC 81 MG tablet Take 81 mg by mouth daily.  . CHOLECALCIFEROL PO Take 5,000  Units by mouth daily.  . cyanocobalamin 1000 MCG tablet Take 1,000 mcg by mouth daily.  . cyclobenzaprine (FLEXERIL) 10 MG tablet Take 1 tablet (10 mg total) by mouth 3 (three) times daily as needed for muscle spasms.  Marland Kitchen PARoxetine (PAXIL) 20 MG tablet Take 1 tablet Daily for Mood  . pilocarpine (PILOCAR) 1 % ophthalmic solution 1 drop 2 (two) times daily.   No current facility-administered medications on file prior to visit.    No Known Allergies   Past Medical History:  Diagnosis Date  . Anxiety   . Chronic lower back pain   . Depression   . DJD (degenerative joint disease)    knees, neck  . GERD (gastroesophageal reflux disease)    has had dilitation in the past   Health Maintenance  Topic Date Due  . DEXA SCAN  01/13/2005  . TETANUS/TDAP  10/01/2018 (Originally 01/14/1960)  . PNA vac Low Risk Adult (2 of 2 - PPSV23) 10/01/2018  . INFLUENZA VACCINE  10/15/2018  . COLONOSCOPY  06/07/2020   Immunization History  Administered Date(s) Administered  . Influenza, High Dose Seasonal PF 01/29/2018  . Influenza-Unspecified 12/09/2016  . Pneumococcal Conjugate-13 09/30/2017   Last Colon - 06/07/2017 - Darlene [name] - adenomatous Polyps - Recc 3 yr f/u due Apr 2022 Last EGD - 06/07/2017 - Darlene [name] - dilated esophageal stricture  Last MGM - 12/10/2017  f/u sch 12/23/2018  Past Surgical History:  Procedure Laterality Date  . ABDOMINAL HYSTERECTOMY  03/1979  . BREAST BIOPSY Left  Darlene [name]  . CATARACT EXTRACTION, BILATERAL Bilateral 2014   Darlene [name], in [place]   Family History  Problem Relation Age of Onset  . Hypertension Mother   . Heart disease Mother   . Hypertension Father   . Heart disease Father   . Tremor Sister   . Dementia Brother   . Hypertension Daughter   . Colon cancer Neg Hx   . Esophageal cancer Neg Hx   . Rectal cancer Neg Hx   . Stomach cancer Neg Hx    Social History   Tobacco Use  . Smoking status: Never Smoker  . Smokeless tobacco: Never  Used  Substance Use Topics  . Alcohol use: Yes    Alcohol/week: 3.0 standard drinks    Types: 3 Standard drinks or equivalent per week    Comment: 3 nights out of the week  . Drug use: No    ROS Constitutional: Denies fever, chills, weight loss/gain, headaches, insomnia,  night sweats, and change in appetite. Does c/o fatigue. Eyes: Denies redness, blurred vision, diplopia, discharge, itchy, watery eyes.  ENT: Denies discharge, congestion, post nasal drip, epistaxis, sore throat, earache, hearing loss, dental pain, Tinnitus, Vertigo, Sinus pain, snoring.  Cardio: Denies chest pain, palpitations, irregular heartbeat, syncope, dyspnea, diaphoresis, orthopnea, PND, claudication, edema Respiratory: denies cough, dyspnea, DOE, pleurisy, hoarseness, laryngitis, wheezing.  Gastrointestinal: Denies dysphagia, heartburn, reflux, water brash, pain, cramps, nausea, vomiting, bloating, diarrhea, constipation, hematemesis, melena, hematochezia, jaundice, hemorrhoids Genitourinary: Denies dysuria, frequency, urgency, nocturia, hesitancy, discharge, hematuria, flank pain Breast: Breast lumps, nipple discharge, bleeding.  Musculoskeletal: Denies arthralgia, myalgia, stiffness, Jt. Swelling, pain, limp, and strain/sprain. Denies falls. Skin: Denies puritis, rash, hives, warts, acne, eczema, changing in skin lesion Neuro: No weakness, tremor, incoordination, spasms, paresthesia, pain Psychiatric: Denies confusion, memory loss, sensory loss. Denies Depression. Endocrine: Denies change in weight, skin, hair change, nocturia, and paresthesia, diabetic polys, visual blurring, hyper / hypo glycemic episodes.  Heme/Lymph: No excessive bleeding, bruising, enlarged lymph nodes.  Physical Exam  BP 128/76   Pulse 72   Temp (!) 97.4 F (36.3 C)   Resp 16   Ht 5' 9.5" (1.765 m)   Wt 219 lb 6.4 oz (99.5 kg)   BMI 31.94 kg/m   General Appearance: Over nourished, well groomed and in no apparent distress.  Eyes:  PERRLA, EOMs, conjunctiva no swelling or erythema, normal fundi and vessels. Sinuses: No frontal/maxillary tenderness ENT/Mouth: EACs patent / TMs  nl. Nares clear without erythema, swelling, mucoid exudates. Oral hygiene is good. No erythema, swelling, or exudate. Tongue normal, non-obstructing. Tonsils not swollen or erythematous. Hearing normal.  Neck: Supple, thyroid not palpable. No bruits, nodes or JVD. Respiratory: Respiratory effort normal.  BS equal and clear bilateral without rales, rhonci, wheezing or stridor. Cardio: Heart sounds are normal with regular rate and rhythm and no murmurs, rubs or gallops. Peripheral pulses are normal and equal bilaterally without edema. No aortic or femoral bruits. Chest: symmetric with normal excursions and percussion. Breasts: Symmetric, without lumps, nipple discharge, retractions, or fibrocystic changes.  Abdomen: Flat, soft with bowel sounds active. Nontender, no guarding, rebound, hernias, masses, or organomegaly.  Lymphatics: Non tender without lymphadenopathy.  Musculoskeletal: Full ROM all peripheral extremities, joint stability, 5/5 strength, and normal gait. Skin: Warm and dry without rashes, lesions, cyanosis, clubbing or  ecchymosis.  Neuro: Cranial nerves intact, reflexes equal bilaterally. Normal muscle tone, no cerebellar symptoms. Sensation intact. No obvious tremor today.  Pysch: Alert and oriented X 3, normal affect, Insight  and Judgment appropriate.   Assessment and [name]  1. Elevated BP without diagnosis of hypertension  - EKG 12-Lead - Urinalysis, Routine w reflex microscopic - Microalbumin / creatinine urine ratio - CBC with Differential/Platelet - COMPLETE METABOLIC PANEL WITH GFR - Magnesium - TSH  2. Hyperlipidemia, mixed  - EKG 12-Lead - Lipid panel - TSH  3. Abnormal glucose  - EKG 12-Lead - Hemoglobin A1c - Insulin, random  4. Vitamin D deficiency  - VITAMIN D 25 Hydroxyl  5. Gastroesophageal reflux  disease  - COMPLETE METABOLIC PANEL WITH GFR  6. Esophageal stricture   7. Iron deficiency anemia, unspecified iron deficiency anemia type  - Iron,Total/Total Iron Binding Cap - CBC with Differential/Platelet  8. History of colon polyps  9. Essential tremor  10. Screening for ischemic heart disease  - EKG 12-Lead  11. FHx: heart disease  - EKG 12-Lead  12. Medication management  - Urinalysis, Routine w reflex microscopic - Microalbumin / creatinine urine ratio - CBC with Differential/Platelet - COMPLETE METABOLIC PANEL WITH GFR - Magnesium - Lipid panel - TSH - Hemoglobin A1c - Insulin, random - VITAMIN D 25 Hydroxyl      Patient was counseled in prudent diet to achieve/maintain BMI less than 25 for weight control, BP monitoring, regular exercise and medications. Discussed med's effects and SE's. Screening labs and tests as requested with regular follow-up as recommended. Over 40 minutes of exam, counseling, chart review and high complex critical decision making was performed.   Kirtland Bouchard, MD

## 2018-09-02 LAB — HEMOGLOBIN A1C
Hgb A1c MFr Bld: 5.4 % of total Hgb (ref <5.7)
Mean Plasma Glucose: 108 (calc)
eAG (mmol/L): 6 (calc)

## 2018-09-02 LAB — INSULIN, RANDOM: Insulin: 2.5 u[IU]/mL

## 2018-09-02 LAB — LIPID PANEL
Cholesterol: 220 mg/dL — ABNORMAL HIGH (ref <200)
HDL: 71 mg/dL (ref >=50)
LDL Cholesterol (Calc): 134 mg/dL (calc) — ABNORMAL HIGH
Non-HDL Cholesterol (Calc): 149 mg/dL (calc) — ABNORMAL HIGH (ref <130)
Total CHOL/HDL Ratio: 3.1 (calc) (ref <5.0)
Triglycerides: 62 mg/dL (ref <150)

## 2018-09-02 LAB — VITAMIN D 25 HYDROXY (VIT D DEFICIENCY, FRACTURES): Vit D, 25-Hydroxy: 61 ng/mL (ref 30–100)

## 2018-09-02 LAB — CBC WITH DIFFERENTIAL/PLATELET
Absolute Monocytes: 543 cells/uL (ref 200–950)
Basophils Absolute: 43 cells/uL (ref 0–200)
Basophils Relative: 0.7 %
Eosinophils Absolute: 61 cells/uL (ref 15–500)
Eosinophils Relative: 1 %
HCT: 34.8 % — ABNORMAL LOW (ref 35.0–45.0)
Hemoglobin: 11.8 g/dL (ref 11.7–15.5)
Lymphs Abs: 1635 cells/uL (ref 850–3900)
MCH: 31.6 pg (ref 27.0–33.0)
MCHC: 33.9 g/dL (ref 32.0–36.0)
MCV: 93.3 fL (ref 80.0–100.0)
MPV: 11.6 fL (ref 7.5–12.5)
Monocytes Relative: 8.9 %
Neutro Abs: 3819 cells/uL (ref 1500–7800)
Neutrophils Relative %: 62.6 %
Platelets: 217 10*3/uL (ref 140–400)
RBC: 3.73 10*6/uL — ABNORMAL LOW (ref 3.80–5.10)
RDW: 12.8 % (ref 11.0–15.0)
Total Lymphocyte: 26.8 %
WBC: 6.1 10*3/uL (ref 3.8–10.8)

## 2018-09-02 LAB — COMPLETE METABOLIC PANEL WITH GFR
AG Ratio: 2 (calc) (ref 1.0–2.5)
ALT: 8 U/L (ref 6–29)
AST: 13 U/L (ref 10–35)
Albumin: 4 g/dL (ref 3.6–5.1)
Alkaline phosphatase (APISO): 51 U/L (ref 37–153)
BUN: 21 mg/dL (ref 7–25)
CO2: 31 mmol/L (ref 20–32)
Calcium: 9.4 mg/dL (ref 8.6–10.4)
Chloride: 105 mmol/L (ref 98–110)
Creat: 0.74 mg/dL (ref 0.60–0.93)
GFR, Est African American: 90 mL/min/{1.73_m2} (ref >=60)
GFR, Est Non African American: 78 mL/min/{1.73_m2} (ref >=60)
Globulin: 2 g/dL (calc) (ref 1.9–3.7)
Glucose, Bld: 101 mg/dL — ABNORMAL HIGH (ref 65–99)
Potassium: 4.1 mmol/L (ref 3.5–5.3)
Sodium: 142 mmol/L (ref 135–146)
Total Bilirubin: 0.5 mg/dL (ref 0.2–1.2)
Total Protein: 6 g/dL — ABNORMAL LOW (ref 6.1–8.1)

## 2018-09-02 LAB — MAGNESIUM: Magnesium: 1.9 mg/dL (ref 1.5–2.5)

## 2018-09-02 LAB — MICROALBUMIN / CREATININE URINE RATIO
Creatinine, Urine: 254 mg/dL (ref 20–275)
Microalb Creat Ratio: 6 mcg/mg creat (ref <30)
Microalb, Ur: 1.5 mg/dL

## 2018-09-02 LAB — TSH: TSH: 2.04 mIU/L (ref 0.40–4.50)

## 2018-09-02 LAB — URINALYSIS, ROUTINE W REFLEX MICROSCOPIC
Bacteria, UA: NONE SEEN /HPF
Bilirubin Urine: NEGATIVE
Glucose, UA: NEGATIVE
Hgb urine dipstick: NEGATIVE
Hyaline Cast: NONE SEEN /LPF
Nitrite: NEGATIVE
Protein, ur: NEGATIVE
Specific Gravity, Urine: 1.028 (ref 1.001–1.03)
pH: <=5 (ref 5.0–8.0)

## 2018-09-02 LAB — IRON, TOTAL/TOTAL IRON BINDING CAP
%SAT: 27 % (calc) (ref 16–45)
Iron: 105 ug/dL (ref 45–160)
TIBC: 396 mcg/dL (calc) (ref 250–450)

## 2018-09-03 ENCOUNTER — Other Ambulatory Visit: Payer: Self-pay | Admitting: Internal Medicine

## 2018-09-03 DIAGNOSIS — K2100 Gastro-esophageal reflux disease with esophagitis, without bleeding: Secondary | ICD-10-CM

## 2018-09-03 MED ORDER — LANSOPRAZOLE 30 MG PO CPDR: DELAYED_RELEASE_CAPSULE | ORAL | 3 refills | Status: DC

## 2018-09-04 ENCOUNTER — Encounter: Payer: Self-pay | Admitting: Internal Medicine

## 2018-09-04 DIAGNOSIS — D509 Iron deficiency anemia, unspecified: Secondary | ICD-10-CM | POA: Insufficient documentation

## 2018-09-04 DIAGNOSIS — K222 Esophageal obstruction: Secondary | ICD-10-CM | POA: Insufficient documentation

## 2018-09-04 DIAGNOSIS — R030 Elevated blood-pressure reading, without diagnosis of hypertension: Secondary | ICD-10-CM | POA: Insufficient documentation

## 2018-09-06 ENCOUNTER — Other Ambulatory Visit: Payer: Self-pay | Admitting: *Deleted

## 2018-09-06 DIAGNOSIS — K2100 Gastro-esophageal reflux disease with esophagitis, without bleeding: Secondary | ICD-10-CM

## 2018-09-06 MED ORDER — LANSOPRAZOLE 30 MG PO CPDR: DELAYED_RELEASE_CAPSULE | ORAL | 3 refills | Status: DC

## 2018-09-07 ENCOUNTER — Telehealth: Payer: Self-pay | Admitting: *Deleted

## 2018-09-07 NOTE — Telephone Encounter (Signed)
Patient information form was mailed to the patient and was asked to return it to the office.

## 2018-09-28 ENCOUNTER — Other Ambulatory Visit: Payer: Self-pay

## 2018-09-28 DIAGNOSIS — D649 Anemia, unspecified: Secondary | ICD-10-CM

## 2018-09-28 DIAGNOSIS — Z1212 Encounter for screening for malignant neoplasm of rectum: Secondary | ICD-10-CM | POA: Diagnosis not present

## 2018-09-28 DIAGNOSIS — Z86010 Personal history of colonic polyps: Secondary | ICD-10-CM

## 2018-09-28 LAB — POC HEMOCCULT BLD/STL (HOME/3-CARD/SCREEN)
Card #2 Fecal Occult Blod, POC: NEGATIVE
Card #3 Fecal Occult Blood, POC: NEGATIVE
Fecal Occult Blood, POC: NEGATIVE

## 2018-10-03 ENCOUNTER — Encounter: Payer: Self-pay | Admitting: Adult Health

## 2018-11-23 ENCOUNTER — Other Ambulatory Visit: Payer: Self-pay | Admitting: Internal Medicine

## 2018-11-23 ENCOUNTER — Other Ambulatory Visit: Payer: Self-pay | Admitting: Adult Health

## 2018-11-28 ENCOUNTER — Encounter: Payer: Self-pay | Admitting: Adult Health

## 2018-11-28 ENCOUNTER — Ambulatory Visit (INDEPENDENT_AMBULATORY_CARE_PROVIDER_SITE_OTHER): Payer: Medicare Other | Admitting: Adult Health

## 2018-11-28 ENCOUNTER — Other Ambulatory Visit: Payer: Self-pay

## 2018-11-28 VITALS — BP 122/76 | HR 79 | Temp 97.3°F | Wt 220.8 lb

## 2018-11-28 DIAGNOSIS — R197 Diarrhea, unspecified: Secondary | ICD-10-CM

## 2018-11-28 DIAGNOSIS — R1031 Right lower quadrant pain: Secondary | ICD-10-CM | POA: Diagnosis not present

## 2018-11-28 DIAGNOSIS — Z23 Encounter for immunization: Secondary | ICD-10-CM | POA: Diagnosis not present

## 2018-11-28 MED ORDER — DICYCLOMINE HCL 10 MG PO CAPS: ORAL_CAPSULE | ORAL | 0 refills | Status: DC

## 2018-11-28 NOTE — Patient Instructions (Addendum)
Will wait on imaging to see how labs look tomorrow morning, then will consider getting another CT scan   Please complete stool sample (needs to be liquid diarrhea) as soon as possible to rule out C. Diff infection following recent antibiotics  Push fluids, stick to bland diet  Any massive bloody diarrhea please go to the ED  Please go to the ER if you have any severe AB pain, unable to hold down food/water, massive blood in stool or vomit, chest pain, shortness of breath  Call office if any other new concerning symptoms    Food Choices to Help Relieve Diarrhea, Adult When you have diarrhea, the foods you eat and your eating habits are very important. Choosing the right foods and drinks can help:  Relieve diarrhea.  Replace lost fluids and nutrients.  Prevent dehydration. What general guidelines should I follow?  Relieving diarrhea  Choose foods with less than 2 g or .07 oz. of fiber per serving.  Limit fats to less than 8 tsp (38 g or 1.34 oz.) a day.  Avoid the following: ? Foods and beverages sweetened with high-fructose corn syrup, honey, or sugar alcohols such as xylitol, sorbitol, and mannitol. ? Foods that contain a lot of fat or sugar. ? Fried, greasy, or spicy foods. ? High-fiber grains, breads, and cereals. ? Raw fruits and vegetables.  Eat foods that are rich in probiotics. These foods include dairy products such as yogurt and fermented milk products. They help increase healthy bacteria in the stomach and intestines (gastrointestinal tract, or GI tract).  If you have lactose intolerance, avoid dairy products. These may make your diarrhea worse.  Take medicine to help stop diarrhea (antidiarrheal medicine) only as told by your health care provider. Replacing nutrients  Eat small meals or snacks every 3-4 hours.  Eat bland foods, such as white rice, toast, or baked potato, until your diarrhea starts to get better. Gradually reintroduce nutrient-rich foods as  tolerated or as told by your health care provider. This includes: ? Well-cooked protein foods. ? Peeled, seeded, and soft-cooked fruits and vegetables. ? Low-fat dairy products.  Take vitamin and mineral supplements as told by your health care provider. Preventing dehydration  Start by sipping water or a special solution to prevent dehydration (oral rehydration solution, ORS). Urine that is clear or pale yellow means that you are getting enough fluid.  Try to drink at least 8-10 cups of fluid each day to help replace lost fluids.  You may add other liquids in addition to water, such as clear juice or decaffeinated sports drinks, as tolerated or as told by your health care provider.  Avoid drinks with caffeine, such as coffee, tea, or soft drinks.  Avoid alcohol. What foods are recommended?     The items listed may not be a complete list. Talk with your health care provider about what dietary choices are best for you. Grains White rice. White, [place], or pita breads (fresh or toasted), including plain rolls, buns, or bagels. White pasta. Saltine, soda, or graham crackers. Pretzels. Low-fiber cereal. Cooked cereals made with water (such as cornmeal, farina, or cream cereals). Plain muffins. Matzo. Melba toast. Zwieback. Vegetables Potatoes (without the skin). Most well-cooked and canned vegetables without skins or seeds. Tender lettuce. Fruits Apple sauce. Fruits canned in juice. Cooked apricots, cherries, grapefruit, peaches, pears, or plums. Fresh bananas and cantaloupe. Meats and other protein foods Baked or boiled chicken. Eggs. Tofu. Fish. Seafood. Smooth nut butters. Ground or well-cooked tender beef, ham,  veal, lamb, pork, or poultry. Dairy Plain yogurt, kefir, and unsweetened liquid yogurt. Lactose-free milk, buttermilk, skim milk, or soy milk. Low-fat or nonfat hard cheese. Beverages Water. Low-calorie sports drinks. Fruit juices without pulp. Strained tomato and vegetable  juices. Decaffeinated teas. Sugar-free beverages not sweetened with sugar alcohols. Oral rehydration solutions, if approved by your health care provider. Seasoning and other foods Bouillon, broth, or soups made from recommended foods. What foods are not recommended? The items listed may not be a complete list. Talk with your health care provider about what dietary choices are best for you. Grains Whole grain, whole wheat, bran, or rye breads, rolls, pastas, and crackers. Wild or brown rice. Whole grain or bran cereals. Barley. Oats and oatmeal. Corn tortillas or taco shells. Granola. Popcorn. Vegetables Raw vegetables. Fried vegetables. Cabbage, broccoli, Brussels sprouts, artichokes, baked beans, beet greens, corn, kale, legumes, peas, sweet potatoes, and yams. Potato skins. Cooked spinach and cabbage. Fruits Dried fruit, including raisins and dates. Raw fruits. Stewed or dried prunes. Canned fruits with syrup. Meat and other protein foods Fried or fatty meats. Deli meats. Chunky nut butters. Nuts and seeds. Beans and lentils. Berniece Salines. Hot dogs. Sausage. Dairy High-fat cheeses. Whole milk, chocolate milk, and beverages made with milk, such as milk shakes. Half-and-half. Cream. sour cream. Ice cream. Beverages Caffeinated beverages (such as coffee, tea, soda, or energy drinks). Alcoholic beverages. Fruit juices with pulp. Prune juice. Soft drinks sweetened with high-fructose corn syrup or sugar alcohols. High-calorie sports drinks. Fats and oils Butter. Cream sauces. Margarine. Salad oils. Plain salad dressings. Olives. Avocados. Mayonnaise. Sweets and desserts Sweet rolls, doughnuts, and sweet breads. Sugar-free desserts sweetened with sugar alcohols such as xylitol and sorbitol. Seasoning and other foods Honey. Hot sauce. Chili powder. Gravy. Cream-based or milk-based soups. Pancakes and waffles. Summary  When you have diarrhea, the foods you eat and your eating habits are very  important.  Make sure you get at least 8-10 cups of fluid each day, or enough to keep your urine clear or pale yellow.  Eat bland foods and gradually reintroduce healthy, nutrient-rich foods as tolerated, or as told by your health care provider.  Avoid high-fiber, fried, greasy, or spicy foods. This information is not intended to replace advice given to you by your health care provider. Make sure you discuss any questions you have with your health care provider. Document Released: 05/23/2003 Document Revised: 06/23/2018 Document Reviewed: 02/28/2016 Elsevier Patient Education  2020 [place].     Colitis  Colitis is inflammation of the colon. Colitis may last a short time (be acute), or it may last a long time (become chronic). What are the causes? This condition may be caused by:  Viruses.  Bacteria.  Reaction to medicine.  Certain autoimmune diseases such as Crohn's disease or ulcerative colitis.  Radiation treatment.  Decreased blood flow to the bowel (ischemia). What are the signs or symptoms? Symptoms of this condition include:  Watery diarrhea.  Passing bloody or tarry stool.  Pain.  Fever.  Vomiting.  Tiredness (fatigue).  Weight loss.  Bloating.  Abdominal pain.  Having fewer bowel movements than usual.  A strong and sudden urge to have a bowel movement.  Feeling like the bowel is not empty after a bowel movement. How is this diagnosed? This condition is diagnosed with a stool test or a blood test. You may also have other tests, such as:  X-rays.  CT scan.  Colonoscopy.  Endoscopy.  Biopsy. How is this treated? Treatment for this  condition depends on the cause. The condition may be treated by:  Resting the bowel. This involves not eating or drinking for a period of time.  Fluids that are given through an IV.  Medicine for pain and diarrhea.  Antibiotic medicines.  Cortisone medicines.  Surgery. Follow these instructions  at home: Eating and drinking   Follow instructions from your health care provider about eating or drinking restrictions.  Drink enough fluid to keep your urine pale yellow.  Work with a dietitian to determine which foods cause your condition to flare up.  Avoid foods that cause flare-ups.  Eat a well-balanced diet. General instructions  If you were prescribed an antibiotic medicine, take it as told by your health care provider. Do not stop taking the antibiotic even if you start to feel better.  Take over-the-counter and prescription medicines only as told by your health care provider.  Keep all follow-up visits as told by your health care provider. This is important. Contact a health care provider if:  Your symptoms do not go away.  You develop new symptoms. Get help right away if you:  Have a fever that does not go away with treatment.  Develop chills.  Have extreme weakness, fainting, or dehydration.  Have repeated vomiting.  Develop severe pain in your abdomen.  Pass bloody or tarry stool. Summary  Colitis is inflammation of the colon. Colitis may last a short time (be acute), or it may last a long time (become chronic).  Treatment for this condition depends on the cause and may include resting the bowel, taking medicines, or having surgery.  If you were prescribed an antibiotic medicine, take it as told by your health care provider. Do not stop taking the antibiotic even if you start to feel better.  Get help right away if you develop severe pain in your abdomen.  Keep all follow-up visits as told by your health care provider. This is important. This information is not intended to replace advice given to you by your health care provider. Make sure you discuss any questions you have with your health care provider. Document Released: 04/09/2004 Document Revised: 09/02/2017 Document Reviewed: 09/02/2017 Elsevier Patient Education  2020 [employer].

## 2018-11-28 NOTE — Addendum Note (Signed)
Addended by: Izora Ribas on: 11/28/2018 12:56 PM   Modules accepted: Orders

## 2018-11-28 NOTE — Progress Notes (Signed)
Assessment and Plan:  Darlene [name] was seen today for gi problem.  Diagnoses and all orders for this visit:  RLQ abdominal pain Reported hx of colitis with similar symptoms, imaging/notes from that time not available for review - she does have colonoscopy 2019 showing no colitis or diverticulitis Diarrhea + recent antibiotic, will obtain stool culture to r/o C. Diff, emphasized need to complete ASAP as will take several days to result Cannot exclude mild appendicitis with RLQ tenderness - consider CT abd pending labs if symptoms not improved with bentyl Emphasized hydration, bland diet Please go to the ER if you have any severe AB pain, unable to hold down food/water, blood in stool or vomit, chest pain, shortness of breath, or any worsening symptoms.  -     CBC with Differential/Platelet -     COMPLETE METABOLIC PANEL WITH GFR -     Urinalysis w microscopic + reflex cultur  Diarrhea of presumed infectious origin -     Gastrointestinal Pathogen Panel PCR; Future  Other orders -     dicyclomine (BENTYL) 10 MG capsule; Take 1 tab three times a day by mouth with meals as needed for abdominal cramping.  Further disposition pending results of labs. Discussed med's effects and SE's.   Over 30 minutes of exam, counseling, chart review, and critical decision making was performed.   Future Appointments  Date Time Provider [place]  12/23/2018 11:30 AM [facility] 2 [facility]  04/03/2019 11:15 AM [name], NP [facility] None  10/10/2019 10:00 AM [name], MD [facility] None    ------------------------------------------------------------------------------------------------------------------   HPI BP 122/76   Pulse 79   Temp (!) 97.3 F (36.3 C)   Wt 220 lb 12.8 oz (100.2 kg)   SpO2 97%   BMI 32.14 kg/m   78 y.o.female presents for evaluation of abdominal pain and stool changes   She reports approximately 1 week ago she started having "fuzzy pencils" stool,  covered in mucus, has yellowish/orange tinge to it, some episodes of liquid diarrhea (maybe 1 episode per day), feels constant urge/pressure to go, increased gas, bloating. She also endorses lower abdominal pain, worse on R, intermittent, cramping, non-radiating, 7/10 at worst. Denies nausea/vomiting, fever/chills. Does endorse mild fatigue, feeling weak. Denies HA, rash, myalgias/arthralgias. She endorses small red blood on tissue when wiping, typical for her with known hemorrhoids. Denies blood in toilet bowl or dark/tarry stools. Denies notable aggravating or alleviating factors.   She reports hx of colitis in ? 2017, no specific diagnoses, wasn't at our office at that time, reports had CT for diagnosis (not available for review). Can't recall treatment. Recent colonoscopy 06/07/2017 showed 2 polyps, very mild internal hemorrhoids.   She reports she had tooth procedure last month, was on clindamycin (had diarrhea, stopped) and was on amoxicillin x week, symptoms started 1-2 weeks later.   Past Surgical History:  Procedure Laterality Date  . ABDOMINAL HYSTERECTOMY  03/1979  . BREAST BIOPSY Left 1986 and 1987  . CATARACT EXTRACTION, BILATERAL Bilateral 2014   Dr. Madelin Headings, in [place]     Past Medical History:  Diagnosis Date  . Anxiety   . Chronic lower back pain   . Depression   . DJD (degenerative joint disease)    knees, neck  . GERD (gastroesophageal reflux disease)    has had dilitation in the past     No Known Allergies  Current Outpatient Medications on File Prior to Visit  Medication Sig  . acetaminophen (TYLENOL) 500 MG  tablet Take 500 mg by mouth every 6 (six) hours as needed.  . Ascorbic Acid (VITAMIN C) 500 MG CAPS Take by mouth.  Marland Kitchen aspirin EC 81 MG tablet Take 81 mg by mouth daily.  . CHOLECALCIFEROL PO Take 5,000 Units by mouth daily.  . cyanocobalamin 1000 MCG tablet Take 1,000 mcg by mouth daily.  . cyclobenzaprine (FLEXERIL) 10 MG tablet Take 1/2 to 1 tablet 2  to 3 x  /day if needed for Muscle Spasm  . diclofenac (VOLTAREN) 75 MG EC tablet Take 1 tablet (75 mg total) by mouth 2 (two) times daily.  . lansoprazole (PREVACID) 30 MG capsule Take 1 capsule Daily for Indigestion & Reflux  . PARoxetine (PAXIL) 20 MG tablet Take 1 tablet Daily for Mood  . pilocarpine (PILOCAR) 1 % ophthalmic solution 1 drop 2 (two) times daily.   No current facility-administered medications on file prior to visit.     ROS: all negative except above.   Physical Exam:  BP 122/76   Pulse 79   Temp (!) 97.3 F (36.3 C)   Wt 220 lb 12.8 oz (100.2 kg)   SpO2 97%   BMI 32.14 kg/m   General Appearance: Well nourished, obese, in no apparent distress. Eyes: PERRLA, EOMs, conjunctiva no swelling or erythema Sinuses: No Frontal/maxillary tenderness ENT/Mouth: Ext aud canals clear, TMs without erythema, bulging. No erythema, swelling, or exudate on post pharynx.  Tonsils not swollen or erythematous. Hearing normal.  Neck: Supple, thyroid normal.  Respiratory: Respiratory effort normal, BS equal bilaterally without rales, rhonchi, wheezing or stridor.  Cardio: RRR with no MRGs. Brisk peripheral pulses without edema.  Abdomen: Soft, + BS.  She has lower abdomianl tenderness, worse on R, no guarding, rebound, hernias, palpable masses. Lymphatics: Non tender without lymphadenopathy.  Musculoskeletal: slow steady gait.  Skin: Warm, dry without rashes, lesions, ecchymosis.  Neuro: Normal muscle tone, no cerebellar symptoms Psych: Awake and oriented X 3, normal affect, Insight and Judgment appropriate.     Izora Ribas, NP 11:34 AM [facility]

## 2018-11-29 LAB — CBC WITH DIFFERENTIAL/PLATELET
Absolute Monocytes: 684 cells/uL (ref 200–950)
Basophils Absolute: 23 cells/uL (ref 0–200)
Basophils Relative: 0.4 %
Eosinophils Absolute: 91 cells/uL (ref 15–500)
Eosinophils Relative: 1.6 %
HCT: 33.8 % — ABNORMAL LOW (ref 35.0–45.0)
Hemoglobin: 11.4 g/dL — ABNORMAL LOW (ref 11.7–15.5)
Lymphs Abs: 1493 cells/uL (ref 850–3900)
MCH: 31 pg (ref 27.0–33.0)
MCHC: 33.7 g/dL (ref 32.0–36.0)
MCV: 91.8 fL (ref 80.0–100.0)
MPV: 11.5 fL (ref 7.5–12.5)
Monocytes Relative: 12 %
Neutro Abs: 3409 cells/uL (ref 1500–7800)
Neutrophils Relative %: 59.8 %
Platelets: 228 10*3/uL (ref 140–400)
RBC: 3.68 10*6/uL — ABNORMAL LOW (ref 3.80–5.10)
RDW: 12.7 % (ref 11.0–15.0)
Total Lymphocyte: 26.2 %
WBC: 5.7 10*3/uL (ref 3.8–10.8)

## 2018-11-29 LAB — COMPLETE METABOLIC PANEL WITH GFR
AG Ratio: 1.8 (calc) (ref 1.0–2.5)
ALT: 11 U/L (ref 6–29)
AST: 16 U/L (ref 10–35)
Albumin: 3.8 g/dL (ref 3.6–5.1)
Alkaline phosphatase (APISO): 58 U/L (ref 37–153)
BUN: 15 mg/dL (ref 7–25)
CO2: 30 mmol/L (ref 20–32)
Calcium: 8.9 mg/dL (ref 8.6–10.4)
Chloride: 105 mmol/L (ref 98–110)
Creat: 0.81 mg/dL (ref 0.60–0.93)
GFR, Est African American: 81 mL/min/{1.73_m2} (ref >=60)
GFR, Est Non African American: 70 mL/min/{1.73_m2} (ref >=60)
Globulin: 2.1 g/dL (calc) (ref 1.9–3.7)
Glucose, Bld: 108 mg/dL — ABNORMAL HIGH (ref 65–99)
Potassium: 4.5 mmol/L (ref 3.5–5.3)
Sodium: 143 mmol/L (ref 135–146)
Total Bilirubin: 0.4 mg/dL (ref 0.2–1.2)
Total Protein: 5.9 g/dL — ABNORMAL LOW (ref 6.1–8.1)

## 2018-12-23 ENCOUNTER — Other Ambulatory Visit: Payer: Self-pay

## 2018-12-23 ENCOUNTER — Ambulatory Visit
Admission: RE | Admit: 2018-12-23 | Discharge: 2018-12-23 | Disposition: A | Discharge status: Home / Self Care | Payer: Medicare Other | Source: Ambulatory Visit | Attending: Adult Health | Admitting: Adult Health

## 2018-12-23 DIAGNOSIS — Z1231 Encounter for screening mammogram for malignant neoplasm of breast: Secondary | ICD-10-CM | POA: Diagnosis not present

## 2019-01-05 ENCOUNTER — Other Ambulatory Visit: Payer: Self-pay | Admitting: Internal Medicine

## 2019-01-05 DIAGNOSIS — Z13220 Encounter for screening for lipoid disorders: Secondary | ICD-10-CM

## 2019-01-30 ENCOUNTER — Other Ambulatory Visit: Payer: Self-pay

## 2019-01-30 ENCOUNTER — Encounter: Payer: Self-pay | Admitting: Adult Health

## 2019-01-30 ENCOUNTER — Ambulatory Visit (INDEPENDENT_AMBULATORY_CARE_PROVIDER_SITE_OTHER): Payer: Medicare Other | Admitting: Adult Health

## 2019-01-30 VITALS — BP 120/70 | HR 77 | Temp 97.2°F | Wt 224.0 lb

## 2019-01-30 DIAGNOSIS — R509 Fever, unspecified: Secondary | ICD-10-CM

## 2019-01-30 DIAGNOSIS — R10811 Right upper quadrant abdominal tenderness: Secondary | ICD-10-CM | POA: Diagnosis not present

## 2019-01-30 DIAGNOSIS — R300 Dysuria: Secondary | ICD-10-CM

## 2019-01-30 NOTE — Patient Instructions (Signed)
Diverticulitis  Diverticulitis is infection or inflammation of small pouches (diverticula) in the colon that form due to a condition called diverticulosis. Diverticula can trap stool (feces) and bacteria, causing infection and inflammation. Diverticulitis may cause severe stomach pain and diarrhea. It may lead to tissue damage in the colon that causes bleeding. The diverticula may also burst (rupture) and cause infected stool to enter other areas of the abdomen. Complications of diverticulitis can include:  Bleeding.  Severe infection.  Severe pain.  Rupture (perforation) of the colon.  Blockage (obstruction) of the colon. What are the causes? This condition is caused by stool becoming trapped in the diverticula, which allows bacteria to grow in the diverticula. This leads to inflammation and infection. What increases the risk? You are more likely to develop this condition if:  You have diverticulosis. The risk for diverticulosis increases if: ? You are overweight or obese. ? You use tobacco products. ? You do not get enough exercise.  You eat a diet that does not include enough fiber. High-fiber foods include fruits, vegetables, beans, nuts, and whole grains. What are the signs or symptoms? Symptoms of this condition may include:  Pain and tenderness in the abdomen. The pain is normally located on the left side of the abdomen, but it may occur in other areas.  Fever and chills.  Bloating.  Cramping.  Nausea.  Vomiting.  Changes in bowel routines.  Blood in your stool. How is this diagnosed? This condition is diagnosed based on:  Your medical history.  A physical exam.  Tests to make sure there is nothing else causing your condition. These tests may include: ? Blood tests. ? Urine tests. ? Imaging tests of the abdomen, including X-rays, ultrasounds, MRIs, or CT scans. How is this treated? Most cases of this condition are mild and can be treated at home.  Treatment may include:  Taking over-the-counter pain medicines.  Following a clear liquid diet.  Taking antibiotic medicines by mouth.  Rest. More severe cases may need to be treated at a hospital. Treatment may include:  Not eating or drinking.  Taking prescription pain medicine.  Receiving antibiotic medicines through an IV tube.  Receiving fluids and nutrition through an IV tube.  Surgery. When your condition is under control, your health care provider may recommend that you have a colonoscopy. This is an exam to look at the entire large intestine. During the exam, a lubricated, bendable tube is inserted into the anus and then passed into the rectum, colon, and other parts of the large intestine. A colonoscopy can show how severe your diverticula are and whether something else may be causing your symptoms. Follow these instructions at home: Medicines  Take over-the-counter and prescription medicines only as told by your health care provider. These include fiber supplements, probiotics, and stool softeners.  If you were prescribed an antibiotic medicine, take it as told by your health care provider. Do not stop taking the antibiotic even if you start to feel better.  Do not drive or use heavy machinery while taking prescription pain medicine. General instructions   Follow a full liquid diet or another diet as directed by your health care provider. After your symptoms improve, your health care provider may tell you to change your diet. He or she may recommend that you eat a diet that contains at least 25 g (25 grams) of fiber daily. Fiber makes it easier to pass stool. Healthy sources of fiber include: ? Berries. One cup contains 4-8 grams of   fiber. ? Beans or lentils. One half cup contains 5-8 grams of fiber. ? Green vegetables. One cup contains 4 grams of fiber.  Exercise for at least 30 minutes, 3 times each week. You should exercise hard enough to raise your heart rate and  break a sweat.  Keep all follow-up visits as told by your health care provider. This is important. You may need a colonoscopy. Contact a health care provider if:  Your pain does not improve.  You have a hard time drinking or eating food.  Your bowel movements do not return to normal. Get help right away if:  Your pain gets worse.  Your symptoms do not get better with treatment.  Your symptoms suddenly get worse.  You have a fever.  You vomit more than one time.  You have stools that are bloody, black, or tarry. Summary  Diverticulitis is infection or inflammation of small pouches (diverticula) in the colon that form due to a condition called diverticulosis. Diverticula can trap stool (feces) and bacteria, causing infection and inflammation.  You are at higher risk for this condition if you have diverticulosis and you eat a diet that does not include enough fiber.  Most cases of this condition are mild and can be treated at home. More severe cases may need to be treated at a hospital.  When your condition is under control, your health care provider may recommend that you have an exam called a colonoscopy. This exam can show how severe your diverticula are and whether something else may be causing your symptoms. This information is not intended to replace advice given to you by your health care provider. Make sure you discuss any questions you have with your health care provider. Document Released: 12/10/2004 Document Revised: 02/12/2017 Document Reviewed: 04/04/2016 Elsevier Patient Education  2020 [employer]  

## 2019-01-30 NOTE — Progress Notes (Signed)
Assessment and Plan:  Darlene [name] was seen today for acute visit.  Diagnoses and all orders for this visit:  Right upper quadrant abdominal tenderness without rebound tenderness Dysuria Fever and chills Over the weekend, improving spontaneously and essentially back to baseline excepting mild tenderness of R upper/lower quadrants on exam Check urine to r/o UTI, check CBC, CMP for renal/liver abnormalities, Murphy's neg, low concern with colicystitis, possible mild diverticulitis/appendicitis that is resolving spontaneously, possible small kidney stone that has passed though no hx of this  Due to actively improving will defer imaging unless getting worse If blood counts elevated consider cipro/flagyl to cover urinary etiology, mild diverticulitis flare Push hydration, do dulcolax to encourage daily BMs The patient was advised to call immediately if she has any concerning symptoms in the interval. The patient voices understanding of current treatment options and is in agreement with the current care plan.The patient knows to call the clinic with any problems, questions or concerns or go to the ER if any further progression of symptoms.  -     CBC with Diff -     COMPLETE METABOLIC PANEL WITH GFR -     Urinalysis w microscopic + reflex cultur   Further disposition pending results of labs. Discussed med's effects and SE's.   Over 15 minutes of exam, counseling, chart review, and critical decision making was performed.   Future Appointments  Date Time Provider [place]  04/03/2019 11:15 AM [name], NP [facility] None  10/10/2019 10:00 AM [name], MD [facility] None    ------------------------------------------------------------------------------------------------------------------   HPI BP 120/70   Pulse 77   Temp (!) 97.2 F (36.2 C)   Wt 224 lb (101.6 kg)   SpO2 97%   BMI 32.60 kg/m   78 y.o.female presents for evaluation of unexplained fever/chills over the  weekend, dysuria.   She reports last week was noting mild pain with urination intermittently, strong odor to urine, woke up Saturday night hurting all over, joints and muscles, worst in back, w fever/chills, dull headache, yesterday felt a bit better but was in bed all day, today feeling somewhat improved. She denies any other sx, URI, mild rattling cough in AM but resolves, denies abd pain, nausea/vomiting/diarrhea, constipation consistent with baseline (every 3 days, takes dulcolax PRN), denies vaginal discharge, no rash. Urine symptoms have resolved and essentially at baseline today.   Staying at home, family living with her has had no symptoms.   She does endorse hx of diverticulitis, no hx of renal calculi  Past Medical History:  Diagnosis Date  . Anxiety   . Chronic lower back pain   . Depression   . DJD (degenerative joint disease)    knees, neck  . GERD (gastroesophageal reflux disease)    has had dilitation in the past     No Known Allergies  Current Outpatient Medications on File Prior to Visit  Medication Sig  . acetaminophen (TYLENOL) 500 MG tablet Take 500 mg by mouth every 6 (six) hours as needed.  . Ascorbic Acid (VITAMIN C) 500 MG CAPS Take by mouth.  Marland Kitchen aspirin EC 81 MG tablet Take 81 mg by mouth daily.  . CHOLECALCIFEROL PO Take 5,000 Units by mouth daily.  . cyanocobalamin 1000 MCG tablet Take 1,000 mcg by mouth daily.  . cyclobenzaprine (FLEXERIL) 10 MG tablet Take 1/2 to 1 tablet 2 to 3 x  /day if needed for Muscle Spasm  . diclofenac (VOLTAREN) 75 MG EC tablet Take 1 tablet 2 x /day with  Food for Pain & Inflammation  . dicyclomine (BENTYL) 10 MG capsule Take 1 tab three times a day by mouth with meals as needed for abdominal cramping.  . lansoprazole (PREVACID) 30 MG capsule Take 1 capsule Daily for Indigestion & Reflux  . PARoxetine (PAXIL) 20 MG tablet Take 1 tablet Daily for Mood  . pilocarpine (PILOCAR) 1 % ophthalmic solution 1 drop 2 (two) times daily.    No current facility-administered medications on file prior to visit.     ROS: all negative except above.   Physical Exam:  BP 120/70   Pulse 77   Temp (!) 97.2 F (36.2 C)   Wt 224 lb (101.6 kg)   SpO2 97%   BMI 32.60 kg/m   General Appearance: Well nourished, in no apparent distress. Eyes: PERRLA, EOMs, conjunctiva no swelling or erythema Sinuses: No Frontal/maxillary tenderness ENT/Mouth: Ext aud canals clear, TMs without erythema, bulging. No erythema, swelling, or exudate on post pharynx.  Tonsils not swollen or erythematous. Hearing normal.  Neck: Supple, thyroid normal.  Respiratory: Respiratory effort normal, BS equal bilaterally without rales, rhonchi, wheezing or stridor.  Cardio: RRR with no MRGs. Brisk peripheral pulses without edema.  Abdomen: Soft, + BS.  MIldly tenderer R upper and lower quadrant with no guarding, rebound, hernias, palpable masses. Lymphatics: Non tender without lymphadenopathy.  Musculoskeletal: No obvious deformity, 5/5 strength, slow steady gait.  Skin: Warm, dry without rashes, lesions, ecchymosis.  Neuro: Cranial nerves intact. Normal muscle tone, no cerebellar symptoms. Sensation intact.  Psych: Awake and oriented X 3, normal affect, Insight and Judgment appropriate.     Izora Ribas, NP 10:56 AM [name]

## 2019-01-31 ENCOUNTER — Other Ambulatory Visit: Payer: Self-pay | Admitting: Adult Health

## 2019-01-31 DIAGNOSIS — D649 Anemia, unspecified: Secondary | ICD-10-CM

## 2019-01-31 DIAGNOSIS — R8290 Unspecified abnormal findings in urine: Secondary | ICD-10-CM

## 2019-01-31 MED ORDER — METRONIDAZOLE 500 MG PO TABS: 500.0000 mg | ORAL_TABLET | Freq: Three times a day (TID) | ORAL | 0 refills | Status: AC

## 2019-01-31 MED ORDER — CIPROFLOXACIN HCL 500 MG PO TABS: 500.0000 mg | ORAL_TABLET | Freq: Two times a day (BID) | ORAL | 0 refills | Status: AC

## 2019-01-31 NOTE — Progress Notes (Signed)
01/31/2019--gave lab results and instructions to patient's daughter [name]). Hemoccult card is being mailed out to the patient. Darlene [name]

## 2019-02-01 LAB — CBC WITH DIFFERENTIAL/PLATELET
Absolute Monocytes: 1232 cells/uL — ABNORMAL HIGH (ref 200–950)
Basophils Absolute: 33 cells/uL (ref 0–200)
Basophils Relative: 0.3 %
Eosinophils Absolute: 33 cells/uL (ref 15–500)
Eosinophils Relative: 0.3 %
HCT: 31.8 % — ABNORMAL LOW (ref 35.0–45.0)
Hemoglobin: 10.4 g/dL — ABNORMAL LOW (ref 11.7–15.5)
Lymphs Abs: 1299 cells/uL (ref 850–3900)
MCH: 29.2 pg (ref 27.0–33.0)
MCHC: 32.7 g/dL (ref 32.0–36.0)
MCV: 89.3 fL (ref 80.0–100.0)
MPV: 12.4 fL (ref 7.5–12.5)
Monocytes Relative: 11.1 %
Neutro Abs: 8503 cells/uL — ABNORMAL HIGH (ref 1500–7800)
Neutrophils Relative %: 76.6 %
Platelets: 158 10*3/uL (ref 140–400)
RBC: 3.56 10*6/uL — ABNORMAL LOW (ref 3.80–5.10)
RDW: 13.4 % (ref 11.0–15.0)
Total Lymphocyte: 11.7 %
WBC: 11.1 10*3/uL — ABNORMAL HIGH (ref 3.8–10.8)

## 2019-02-01 LAB — COMPLETE METABOLIC PANEL WITH GFR
AG Ratio: 1.9 (calc) (ref 1.0–2.5)
ALT: 24 U/L (ref 6–29)
AST: 29 U/L (ref 10–35)
Albumin: 3.8 g/dL (ref 3.6–5.1)
Alkaline phosphatase (APISO): 59 U/L (ref 37–153)
BUN: 19 mg/dL (ref 7–25)
CO2: 28 mmol/L (ref 20–32)
Calcium: 9.2 mg/dL (ref 8.6–10.4)
Chloride: 103 mmol/L (ref 98–110)
Creat: 0.84 mg/dL (ref 0.60–0.93)
GFR, Est African American: 77 mL/min/{1.73_m2} (ref >=60)
GFR, Est Non African American: 66 mL/min/{1.73_m2} (ref >=60)
Globulin: 2 g/dL (calc) (ref 1.9–3.7)
Glucose, Bld: 92 mg/dL (ref 65–99)
Potassium: 4 mmol/L (ref 3.5–5.3)
Sodium: 141 mmol/L (ref 135–146)
Total Bilirubin: 0.9 mg/dL (ref 0.2–1.2)
Total Protein: 5.8 g/dL — ABNORMAL LOW (ref 6.1–8.1)

## 2019-02-01 LAB — URINALYSIS W MICROSCOPIC + REFLEX CULTURE
Bacteria, UA: NONE SEEN /HPF
Bilirubin Urine: NEGATIVE
Glucose, UA: NEGATIVE
Hyaline Cast: NONE SEEN /LPF
Nitrites, Initial: NEGATIVE
Specific Gravity, Urine: 1.025 (ref 1.001–1.03)
pH: <=5 (ref 5.0–8.0)

## 2019-02-01 LAB — URINE CULTURE
MICRO NUMBER:: 1106497
Result:: NO GROWTH
SPECIMEN QUALITY:: ADEQUATE

## 2019-02-01 LAB — CULTURE INDICATED

## 2019-02-10 ENCOUNTER — Ambulatory Visit: Payer: Medicare Other

## 2019-02-13 ENCOUNTER — Ambulatory Visit (INDEPENDENT_AMBULATORY_CARE_PROVIDER_SITE_OTHER): Payer: Medicare Other

## 2019-02-13 ENCOUNTER — Other Ambulatory Visit: Payer: Self-pay

## 2019-02-13 DIAGNOSIS — R8290 Unspecified abnormal findings in urine: Secondary | ICD-10-CM

## 2019-02-13 DIAGNOSIS — D649 Anemia, unspecified: Secondary | ICD-10-CM

## 2019-02-13 NOTE — Progress Notes (Signed)
Patient presents to the office for a nurse visit to have labs done. Patient had completed antibiotics, for treatment of having an abnormal urinalysis but complains of diarrhea, which started 2 days after starting the antibiotics. I inquired about the hemoccult card that was supposed to be completed but states that she cannot do so having the diarrhea. Vitals taken and recorded.

## 2019-02-14 LAB — URINALYSIS W MICROSCOPIC + REFLEX CULTURE
Bilirubin Urine: NEGATIVE
Glucose, UA: NEGATIVE
Hgb urine dipstick: NEGATIVE
Ketones, ur: NEGATIVE
Leukocyte Esterase: NEGATIVE
Nitrites, Initial: NEGATIVE
Protein, ur: NEGATIVE
Specific Gravity, Urine: 1.015 (ref 1.001–1.03)
pH: 6 (ref 5.0–8.0)

## 2019-02-14 LAB — CBC WITH DIFFERENTIAL/PLATELET
Absolute Monocytes: 600 cells/uL (ref 200–950)
Basophils Absolute: 41 cells/uL (ref 0–200)
Basophils Relative: 0.6 %
Eosinophils Absolute: 62 cells/uL (ref 15–500)
Eosinophils Relative: 0.9 %
HCT: 34.1 % — ABNORMAL LOW (ref 35.0–45.0)
Hemoglobin: 11.2 g/dL — ABNORMAL LOW (ref 11.7–15.5)
Lymphs Abs: 1856 cells/uL (ref 850–3900)
MCH: 28.5 pg (ref 27.0–33.0)
MCHC: 32.8 g/dL (ref 32.0–36.0)
MCV: 86.8 fL (ref 80.0–100.0)
MPV: 11.4 fL (ref 7.5–12.5)
Monocytes Relative: 8.7 %
Neutro Abs: 4340 cells/uL (ref 1500–7800)
Neutrophils Relative %: 62.9 %
Platelets: 401 10*3/uL — ABNORMAL HIGH (ref 140–400)
RBC: 3.93 10*6/uL (ref 3.80–5.10)
RDW: 14 % (ref 11.0–15.0)
Total Lymphocyte: 26.9 %
WBC: 6.9 10*3/uL (ref 3.8–10.8)

## 2019-02-14 LAB — NO CULTURE INDICATED

## 2019-02-23 ENCOUNTER — Ambulatory Visit: Payer: Self-pay | Admitting: Adult Health

## 2019-03-28 DIAGNOSIS — Z23 Encounter for immunization: Secondary | ICD-10-CM | POA: Diagnosis not present

## 2019-03-30 NOTE — Progress Notes (Signed)
MEDICARE WELLNESS  Assessment and Plan:  Encounter for Annual Medicare Wellness visit  Gastroesophageal reflux disease with esophagitis Well managed on current medications Discussed diet, avoiding triggers and other lifestyle changes - try to cut back on diclofenac, may benefit from knee injection- declines at this time  Esophageal stricture Continue GERD medications; follows with GI Discussed diet, avoiding triggers and other lifestyle changes  Primary osteoarthritis involving multiple joints Takes diclofenac, primarily knees, does fairly, declines further interventions at this time   Recurrent major depressive disorder, in full remission ([place]) Continue medications - paxil Lifestyle discussed: diet/exerise, sleep hygiene, stress management, hydration  Chronic low back pain with left-sided sciatica, unspecified back pain laterality Takes diclofenac, flexeril - monitor  Obesity (BMI 30.0-34.9) Long discussion about weight loss, diet, and exercise Recommended diet heavy in fruits and veggies and low in animal meats, cheeses, and dairy products, appropriate calorie intake Discussed appropriate weight for height  She is poorly motivated to work on weight  Follow up at next visit  History of colon polyps Repeat colonoscopy 2022 per GI  Anemia, unspecified type Stable; Monitor  Vitamin D deficiency -     Continue supplement  Cholesterol Moderate elevations; low risk history; currently managed by lifestyle  Continue low cholesterol diet and exercise.  Check lipid panel.    Discussed med's effects and SE's. Screening labs and tests as requested with regular follow-up as recommended. Over 30 minutes of exam, counseling, chart review, and complex, high level critical decision making was performed this visit.   Future Appointments  Date Time Provider [place]  10/10/2019 10:00 AM [name], MD [facility] None    Plan:   During the course of the visit the  patient was educated and counseled about appropriate screening and preventive services including:    Pneumococcal vaccine   Prevnar 13  Influenza vaccine  Td vaccine  Screening electrocardiogram  Bone densitometry screening  Colorectal cancer screening  Diabetes screening  Glaucoma screening  Nutrition counseling   Advanced directives: requested    HPI  79 y.o. female  presents for a wellness and follow up for has DJD (degenerative joint disease); Chronic lower back pain; Depression; GERD (gastroesophageal reflux disease); Obesity (BMI 30.0-34.9); History of colon polyps; Abnormal glucose; Hyperlipidemia, mixed; Vitamin D deficiency; Continuous leakage of urine; Elevated BP without diagnosis of hypertension; Esophageal stricture; and Iron deficiency anemia on their problem list.   She has been on paxil for depression for many years and doing well.   She lives with her daughter and family, they drive her when needed. Didn't want to bother getting new driver's license. She watches her 2 young grandchildren.   BMI is Body mass index is 32.87 kg/m., she has been working on diet, eating better living with daughter. She has incontinence, has tried oxybutynin and myrbetriq that did not help. Wears depends. She is not interested in working on weight loss.  Wt Readings from Last 3 Encounters:  04/03/19 225 lb 12.8 oz (102.4 kg)  02/13/19 223 lb 3.2 oz (101.2 kg)  01/30/19 224 lb (101.6 kg)   Today their BP is BP: 118/78  She does not workout. She denies chest pain, shortness of breath, dizziness.   She is not on cholesterol medication, reports hx of myalgias with unknown medication at one point, declines medications. Her cholesterol is not at goal. The cholesterol last visit was:   Lab Results  Component Value Date   CHOL 220 (H) 09/01/2018   HDL 71 09/01/2018   LDLCALC  134 (H) 09/01/2018   TRIG 62 09/01/2018   CHOLHDL 3.1 09/01/2018    She has not been working on diet and  exercise for glucose management. Last A1C in the office was:  Lab Results  Component Value Date   HGBA1C 5.4 09/01/2018    Last GFR:  Lab Results  Component Value Date   GFRNONAA 66 01/30/2019   Patient is on Vitamin D supplement.   Lab Results  Component Value Date   VD25OH 61 09/01/2018        Current Medications:      Current Outpatient Medications (Analgesics):  .  acetaminophen (TYLENOL) 500 MG tablet, Take 500 mg by mouth every 6 (six) hours as needed. Marland Kitchen  aspirin EC 81 MG tablet, Take 81 mg by mouth daily. .  diclofenac (VOLTAREN) 75 MG EC tablet, Take 1 tablet 2 x /day with Food for Pain & Inflammation  Current Outpatient Medications (Hematological):  .  cyanocobalamin 1000 MCG tablet, Take 1,000 mcg by mouth daily.  Current Outpatient Medications (Other):  Marland Kitchen  Ascorbic Acid (VITAMIN C) 500 MG CAPS, Take by mouth. .  CHOLECALCIFEROL PO, Take 5,000 Units by mouth daily. .  cyclobenzaprine (FLEXERIL) 10 MG tablet, Take 1/2 to 1 tablet 2 to 3 x  /day if needed for Muscle Spasm .  dicyclomine (BENTYL) 10 MG capsule, Take 1 tab three times a day by mouth with meals as needed for abdominal cramping. .  lansoprazole (PREVACID) 30 MG capsule, Take 1 capsule Daily for Indigestion & Reflux .  PARoxetine (PAXIL) 20 MG tablet, Take 1 tablet Daily for Mood .  pilocarpine (PILOCAR) 1 % ophthalmic solution, 1 drop 2 (two) times daily.  Allergies:  No Known Allergies   Medical History:  She has DJD (degenerative joint disease); Chronic lower back pain; Depression; GERD (gastroesophageal reflux disease); Obesity (BMI 30.0-34.9); History of colon polyps; Abnormal glucose; Hyperlipidemia, mixed; Vitamin D deficiency; Continuous leakage of urine; Elevated BP without diagnosis of hypertension; Esophageal stricture; and Iron deficiency anemia on their problem list.   Health Maintenance:   Immunization History  Administered Date(s) Administered  . Influenza, High Dose Seasonal PF  01/29/2018  . Influenza-Unspecified 12/09/2016, 11/28/2018  . Moderna SARS-COVID-2 Vaccination 03/28/2019  . Pneumococcal Conjugate-13 02/12/2016, 09/30/2017  . Pneumococcal Polysaccharide-23 03/16/2006, 04/02/2008  . Zoster 07/31/2013   Tetanus: Declines today, cost Pneumovax: 2008, 2010 Prevnar 13: 2017, 2019 Flu vaccine: 11/2018 Zostavax: 2015 Covid 19: 03/2019  Pap: remote MGM: 12/2018 DEXA: remote, normal, declines  Colonoscopy: 05/2017, polyps  - due 05/2020, she is unsure if will pursue EGD: 05/2017 CXR 03/2017  Last Dental Exam: Dr. Lynnette Caffey, last visit 2020, goes q70m Last Eye Exam: Dr.?, 12/2016, overdue   Patient Care Team: [name], MD as PCP - General (Internal Medicine)  Surgical History:  She has a past surgical history that includes Abdominal hysterectomy (03/1979); Cataract extraction, bilateral (Bilateral, 2014); and Breast biopsy (Left, 1986 and 1987). Family History:  Herfamily history includes Dementia in her brother; Heart disease in her father and mother; Hypertension in her daughter, father, and mother; Tremor in her sister. Social History:  She reports that she has never smoked. She has never used smokeless tobacco. She reports current alcohol use of about 3.0 standard drinks of alcohol per week. She reports that she does not use drugs.  MEDICARE WELLNESS OBJECTIVES: Physical activity: Current Exercise Habits: The patient does not participate in regular exercise at present, Exercise limited by: None identified Cardiac risk factors: Cardiac Risk Factors include:  dyslipidemia;hypertension;advanced age (>44men, >87 women);family history of premature cardiovascular disease;obesity (BMI >30kg/m2);sedentary lifestyle Depression/mood screen:   Depression screen [facility] 2/9 04/03/2019  Decreased Interest 0  Down, Depressed, Hopeless 0  PHQ - 2 Score 0    ADLs:  In your present state of health, do you have any difficulty performing the following activities:  04/03/2019 09/04/2018  Hearing? N N  Vision? N N  Difficulty concentrating or making decisions? N N  Walking or climbing stairs? N N  Dressing or bathing? N N  Doing errands, shopping? N N  Comment daughter drives -  Some recent data might be hidden     Cognitive Testing  Alert? Yes  Normal Appearance?Yes  Oriented to person? Yes  Place? Yes   Time? Yes  Recall of three objects?  Yes  Can perform simple calculations? Yes  Displays appropriate judgment?Yes  Can read the correct time from a watch face?Yes  EOL planning: Does Patient Have a Medical Advance Directive?: No Would patient like information on creating a medical advance directive?: No - Patient declined    Review of Systems  Constitutional: Negative for malaise/fatigue and weight loss.  HENT: Negative for hearing loss and tinnitus.   Eyes: Negative for blurred vision and double vision.  Respiratory: Negative for cough, shortness of breath and wheezing.   Cardiovascular: Negative for chest pain, palpitations, orthopnea, claudication and leg swelling.  Gastrointestinal: Negative for abdominal pain, blood in stool, constipation, diarrhea, heartburn, melena, nausea and vomiting.  Genitourinary: Negative.   Musculoskeletal: Negative for joint pain and myalgias.  Skin: Negative for rash.  Neurological: Negative for dizziness, tingling, sensory change, weakness and headaches.  Endo/Heme/Allergies: Negative for polydipsia.  Psychiatric/Behavioral: Negative.   All other systems reviewed and are negative.    Physical Exam: Estimated body mass index is 32.87 kg/m as calculated from the following:   Height as of 09/01/18: 5' 9.5" (1.765 m).   Weight as of this encounter: 225 lb 12.8 oz (102.4 kg). BP 118/78   Pulse 69   Temp (!) 97.3 F (36.3 C)   Wt 225 lb 12.8 oz (102.4 kg)   SpO2 97%   BMI 32.87 kg/m  General Appearance: Well nourished, in no apparent distress.  Eyes: PERRLA, EOMs, conjunctiva no swelling or  erythema, normal fundi and vessels.  Sinuses: No Frontal/maxillary tenderness  ENT/Mouth: Ext aud canals clear, normal light reflex with TMs without erythema, bulging. Mask in place; oral exam deferred. Hearing normal.  Neck: Supple, thyroid normal. No bruits  Respiratory: Respiratory effort normal, BS equal bilaterally without rales, rhonchi, wheezing or stridor.  Cardio: RRR without murmurs, rubs or gallops. Brisk peripheral pulses without edema.  Chest: symmetric, with normal excursions and percussion.  Breasts: Symmetric, without lumps, nipple discharge, retractions.  Abdomen: Soft, nontender, no guarding, rebound, hernias, masses, or organomegaly.  Lymphatics: Non tender without lymphadenopathy.  Genitourinary: Defer Musculoskeletal: Full ROM all peripheral extremities, 5/5 strength, and slow steady gait. Mild kyphosis.  Skin: Warm, dry without rashes, lesions, ecchymosis. Neuro: Cranial nerves intact, reflexes equal bilaterally. Normal muscle tone, no cerebellar symptoms. Sensation intact.  Psych: Awake and oriented X 3, normal affect, Insight and Judgment appropriate.    Medicare Attestation I have personally reviewed: The patient's medical and social history Their use of alcohol, tobacco or illicit drugs Their current medications and supplements The patient's functional ability including ADLs,fall risks, home safety risks, cognitive, and hearing and visual impairment Diet and physical activities Evidence for depression or mood disorders  The patient's weight, height,  BMI, and visual acuity have been recorded in the chart.  I have made referrals, counseling, and provided education to the patient based on review of the above and I have provided the patient with a written personalized care plan for preventive services.     Gorden Harms Everrett Lacasse 12:16 PM [facility]

## 2019-04-03 ENCOUNTER — Encounter: Payer: Self-pay | Admitting: Adult Health

## 2019-04-03 ENCOUNTER — Other Ambulatory Visit: Payer: Self-pay

## 2019-04-03 ENCOUNTER — Ambulatory Visit (INDEPENDENT_AMBULATORY_CARE_PROVIDER_SITE_OTHER): Payer: Medicare Other | Admitting: Adult Health

## 2019-04-03 VITALS — BP 118/78 | HR 69 | Temp 97.3°F | Wt 225.8 lb

## 2019-04-03 DIAGNOSIS — K222 Esophageal obstruction: Secondary | ICD-10-CM | POA: Diagnosis not present

## 2019-04-03 DIAGNOSIS — R7309 Other abnormal glucose: Secondary | ICD-10-CM

## 2019-04-03 DIAGNOSIS — M5442 Lumbago with sciatica, left side: Secondary | ICD-10-CM

## 2019-04-03 DIAGNOSIS — M150 Primary generalized (osteo)arthritis: Secondary | ICD-10-CM

## 2019-04-03 DIAGNOSIS — D509 Iron deficiency anemia, unspecified: Secondary | ICD-10-CM

## 2019-04-03 DIAGNOSIS — Z860100 Personal history of colon polyps, unspecified: Secondary | ICD-10-CM

## 2019-04-03 DIAGNOSIS — M8949 Other hypertrophic osteoarthropathy, multiple sites: Secondary | ICD-10-CM | POA: Diagnosis not present

## 2019-04-03 DIAGNOSIS — R6889 Other general symptoms and signs: Secondary | ICD-10-CM | POA: Diagnosis not present

## 2019-04-03 DIAGNOSIS — G8929 Other chronic pain: Secondary | ICD-10-CM

## 2019-04-03 DIAGNOSIS — E782 Mixed hyperlipidemia: Secondary | ICD-10-CM

## 2019-04-03 DIAGNOSIS — M159 Polyosteoarthritis, unspecified: Secondary | ICD-10-CM

## 2019-04-03 DIAGNOSIS — E559 Vitamin D deficiency, unspecified: Secondary | ICD-10-CM

## 2019-04-03 DIAGNOSIS — Z0000 Encounter for general adult medical examination without abnormal findings: Secondary | ICD-10-CM

## 2019-04-03 DIAGNOSIS — E669 Obesity, unspecified: Secondary | ICD-10-CM

## 2019-04-03 DIAGNOSIS — K2100 Gastro-esophageal reflux disease with esophagitis, without bleeding: Secondary | ICD-10-CM

## 2019-04-03 DIAGNOSIS — N3945 Continuous leakage: Secondary | ICD-10-CM

## 2019-04-03 DIAGNOSIS — R030 Elevated blood-pressure reading, without diagnosis of hypertension: Secondary | ICD-10-CM

## 2019-04-03 DIAGNOSIS — Z0001 Encounter for general adult medical examination with abnormal findings: Secondary | ICD-10-CM | POA: Diagnosis not present

## 2019-04-03 DIAGNOSIS — E66811 Obesity, class 1: Secondary | ICD-10-CM

## 2019-04-03 DIAGNOSIS — D649 Anemia, unspecified: Secondary | ICD-10-CM | POA: Diagnosis not present

## 2019-04-03 DIAGNOSIS — F3342 Major depressive disorder, recurrent, in full remission: Secondary | ICD-10-CM | POA: Diagnosis not present

## 2019-04-03 NOTE — Patient Instructions (Addendum)
Darlene [name] , Thank you for taking time to come for your Medicare Wellness Visit. I appreciate your ongoing commitment to your health goals. Please review the following plan we discussed and let me know if I can assist you in the future.   These are the goals we discussed: Goals    . DIET - INCREASE WATER INTAKE    . DIET - REDUCE FAST FOOD INTAKE    . DIET - REDUCE PORTION SIZE    . Exercise 3x per week (30 min per time)       This is a list of the screening recommended for you and due dates:  Health Maintenance  Topic Date Due  . DEXA scan (bone density measurement)  04/02/2020*  . Tetanus Vaccine  04/02/2020*  . Colon Cancer Screening  06/07/2020  . Flu Shot  Completed  . Pneumonia vaccines  Completed  *Topic was postponed. The date shown is not the original due date.     Eye doctors that you can call, they are all very close to our office   Dr. Delman Cheadle [phone] Dr. Bing Plume [phone] Dr. Herbert Deaner Wetzel directives are legal documents that let you make choices ahead of time about your health care and medical treatment in case you become unable to communicate for yourself. Advance directives are a way for you to make known your wishes to family, friends, and health care providers. This can let others know about your end-of-life care if you become unable to communicate. Discussing and writing advance directives should happen over time rather than all at once. Advance directives can be changed depending on your situation and what you want, even after you have signed the advance directives. There are different types of advance directives, such as:  Medical power of attorney.  Living will.  Do not resuscitate (DNR) or do not attempt resuscitation (DNAR) order. Health care proxy and medical power of attorney A health care proxy is also called a health care agent. This is a person who is appointed to make medical decisions for you in  cases where you are unable to make the decisions yourself. Generally, people choose someone they know well and trust to represent their preferences. Make sure to ask this person for an agreement to act as your proxy. A proxy may have to exercise judgment in the event of a medical decision for which your wishes are not known. A medical power of attorney is a legal document that names your health care proxy. Depending on the laws in your state, after the document is written, it may also need to be:  Signed.  Notarized.  Dated.  Copied.  Witnessed.  Incorporated into your medical record. You may also want to appoint someone to manage your money in a situation in which you are unable to do so. This is called a durable power of attorney for finances. It is a separate legal document from the durable power of attorney for health care. You may choose the same person or someone different from your health care proxy to act as your agent in money matters. If you do not appoint a proxy, or if there is a concern that the proxy is not acting in your best interests, a court may appoint a guardian to act on your behalf. Living will A living will is a set of instructions that state your wishes about medical care when you cannot  express them yourself. Health care providers should keep a copy of your living will in your medical record. You may want to give a copy to family members or friends. To alert caregivers in case of an emergency, you can place a card in your wallet to let them know that you have a living will and where they can find it. A living will is used if you become:  Terminally ill.  Disabled.  Unable to communicate or make decisions. Items to consider in your living will include:  To use or not to use life-support equipment, such as dialysis machines and breathing machines (ventilators).  A DNR or DNAR order. This tells health care providers not to use cardiopulmonary resuscitation (CPR) if  breathing or heartbeat stops.  To use or not to use tube feeding.  To be given or not to be given food and fluids.  Comfort (palliative) care when the goal becomes comfort rather than a cure.  Donation of organs and tissues. A living will does not give instructions for distributing your money and property if you should pass away. DNR or DNAR A DNR or DNAR order is a request not to have CPR in the event that your heart stops beating or you stop breathing. If a DNR or DNAR order has not been made and shared, a health care provider will try to help any patient whose heart has stopped or who has stopped breathing. If you plan to have surgery, talk with your health care provider about how your DNR or DNAR order will be followed if problems occur. What if I do not have an advance directive? If you do not have an advance directive, some states assign family decision makers to act on your behalf based on how closely you are related to them. Each state has its own laws about advance directives. You may want to check with your health care provider, attorney, or state representative about the laws in your state. Summary  Advance directives are the legal documents that allow you to make choices ahead of time about your health care and medical treatment in case you become unable to tell others about your care.  The process of discussing and writing advance directives should happen over time. You can change the advance directives, even after you have signed them.  Advance directives include DNR or DNAR orders, living wills, and designating an agent as your medical power of attorney. This information is not intended to replace advice given to you by your health care provider. Make sure you discuss any questions you have with your health care provider. Document Revised: 09/29/2018 Document Reviewed: 09/29/2018 Elsevier Patient Education  [place].

## 2019-04-05 ENCOUNTER — Other Ambulatory Visit: Payer: Self-pay | Admitting: Adult Health

## 2019-04-05 DIAGNOSIS — D509 Iron deficiency anemia, unspecified: Secondary | ICD-10-CM

## 2019-04-05 LAB — COMPLETE METABOLIC PANEL WITH GFR
AG Ratio: 1.9 (calc) (ref 1.0–2.5)
ALT: 20 U/L (ref 6–29)
AST: 18 U/L (ref 10–35)
Albumin: 3.9 g/dL (ref 3.6–5.1)
Alkaline phosphatase (APISO): 64 U/L (ref 37–153)
BUN: 19 mg/dL (ref 7–25)
CO2: 31 mmol/L (ref 20–32)
Calcium: 9.5 mg/dL (ref 8.6–10.4)
Chloride: 105 mmol/L (ref 98–110)
Creat: 0.74 mg/dL (ref 0.60–0.93)
GFR, Est African American: 89 mL/min/{1.73_m2} (ref >=60)
GFR, Est Non African American: 77 mL/min/{1.73_m2} (ref >=60)
Globulin: 2.1 g/dL (calc) (ref 1.9–3.7)
Glucose, Bld: 130 mg/dL — ABNORMAL HIGH (ref 65–99)
Potassium: 3.7 mmol/L (ref 3.5–5.3)
Sodium: 145 mmol/L (ref 135–146)
Total Bilirubin: 0.4 mg/dL (ref 0.2–1.2)
Total Protein: 6 g/dL — ABNORMAL LOW (ref 6.1–8.1)

## 2019-04-05 LAB — CBC WITH DIFFERENTIAL/PLATELET
Absolute Monocytes: 432 cells/uL (ref 200–950)
Basophils Absolute: 32 cells/uL (ref 0–200)
Basophils Relative: 0.6 %
Eosinophils Absolute: 92 cells/uL (ref 15–500)
Eosinophils Relative: 1.7 %
HCT: 31.5 % — ABNORMAL LOW (ref 35.0–45.0)
Hemoglobin: 10.1 g/dL — ABNORMAL LOW (ref 11.7–15.5)
Lymphs Abs: 1280 cells/uL (ref 850–3900)
MCH: 28.1 pg (ref 27.0–33.0)
MCHC: 32.1 g/dL (ref 32.0–36.0)
MCV: 87.7 fL (ref 80.0–100.0)
MPV: 11.5 fL (ref 7.5–12.5)
Monocytes Relative: 8 %
Neutro Abs: 3564 cells/uL (ref 1500–7800)
Neutrophils Relative %: 66 %
Platelets: 218 10*3/uL (ref 140–400)
RBC: 3.59 10*6/uL — ABNORMAL LOW (ref 3.80–5.10)
RDW: 14.6 % (ref 11.0–15.0)
Total Lymphocyte: 23.7 %
WBC: 5.4 10*3/uL (ref 3.8–10.8)

## 2019-04-05 LAB — RETICULOCYTES
ABS Retic: 50120 cells/uL (ref 20000–8000)
Retic Ct Pct: 1.4 %

## 2019-04-05 LAB — IRON,TIBC AND FERRITIN PANEL
%SAT: 7 % (calc) — ABNORMAL LOW (ref 16–45)
Ferritin: 5 ng/mL — ABNORMAL LOW (ref 16–288)
Iron: 31 ug/dL — ABNORMAL LOW (ref 45–160)
TIBC: 471 mcg/dL (calc) — ABNORMAL HIGH (ref 250–450)

## 2019-04-05 LAB — TEST AUTHORIZATION

## 2019-04-05 LAB — LIPID PANEL
Cholesterol: 216 mg/dL — ABNORMAL HIGH (ref <200)
HDL: 71 mg/dL (ref >=50)
LDL Cholesterol (Calc): 122 mg/dL (calc) — ABNORMAL HIGH
Non-HDL Cholesterol (Calc): 145 mg/dL (calc) — ABNORMAL HIGH (ref <130)
Total CHOL/HDL Ratio: 3 (calc) (ref <5.0)
Triglycerides: 123 mg/dL (ref <150)

## 2019-04-05 LAB — TSH: TSH: 2.08 mIU/L (ref 0.40–4.50)

## 2019-04-05 LAB — MAGNESIUM: Magnesium: 2 mg/dL (ref 1.5–2.5)

## 2019-04-14 ENCOUNTER — Other Ambulatory Visit: Payer: Self-pay | Admitting: Internal Medicine

## 2019-04-14 MED ORDER — AZITHROMYCIN 250 MG PO TABS: ORAL_TABLET | ORAL | 1 refills | Status: DC

## 2019-04-14 MED ORDER — PROMETHAZINE-DM 6.25-15 MG/5ML PO SYRP: ORAL_SOLUTION | ORAL | 1 refills | Status: DC

## 2019-04-14 MED ORDER — DEXAMETHASONE 4 MG PO TABS: ORAL_TABLET | ORAL | 0 refills | Status: DC

## 2019-04-15 ENCOUNTER — Encounter (HOSPITAL_COMMUNITY): Payer: Self-pay | Admitting: Emergency Medicine

## 2019-04-15 ENCOUNTER — Emergency Department (HOSPITAL_COMMUNITY): Payer: Medicare Other

## 2019-04-15 ENCOUNTER — Inpatient Hospital Stay (HOSPITAL_COMMUNITY)
Admission: EM | Admit: 2019-04-15 | Discharge: 2019-04-17 | DRG: 309 | Disposition: A | Discharge status: Home / Self Care | Payer: Medicare Other | Attending: Internal Medicine | Admitting: Internal Medicine

## 2019-04-15 ENCOUNTER — Other Ambulatory Visit: Payer: Self-pay

## 2019-04-15 DIAGNOSIS — Z7982 Long term (current) use of aspirin: Secondary | ICD-10-CM

## 2019-04-15 DIAGNOSIS — J111 Influenza due to unidentified influenza virus with other respiratory manifestations: Secondary | ICD-10-CM | POA: Diagnosis not present

## 2019-04-15 DIAGNOSIS — I4891 Unspecified atrial fibrillation: Secondary | ICD-10-CM | POA: Diagnosis not present

## 2019-04-15 DIAGNOSIS — D509 Iron deficiency anemia, unspecified: Secondary | ICD-10-CM | POA: Diagnosis not present

## 2019-04-15 DIAGNOSIS — K219 Gastro-esophageal reflux disease without esophagitis: Secondary | ICD-10-CM | POA: Diagnosis present

## 2019-04-15 DIAGNOSIS — M5442 Lumbago with sciatica, left side: Secondary | ICD-10-CM

## 2019-04-15 DIAGNOSIS — R000 Tachycardia, unspecified: Secondary | ICD-10-CM | POA: Diagnosis not present

## 2019-04-15 DIAGNOSIS — K2100 Gastro-esophageal reflux disease with esophagitis, without bleeding: Secondary | ICD-10-CM | POA: Diagnosis not present

## 2019-04-15 DIAGNOSIS — Z8249 Family history of ischemic heart disease and other diseases of the circulatory system: Secondary | ICD-10-CM

## 2019-04-15 DIAGNOSIS — G8929 Other chronic pain: Secondary | ICD-10-CM | POA: Diagnosis present

## 2019-04-15 DIAGNOSIS — N390 Urinary tract infection, site not specified: Secondary | ICD-10-CM | POA: Diagnosis not present

## 2019-04-15 DIAGNOSIS — M545 Low back pain: Secondary | ICD-10-CM | POA: Diagnosis present

## 2019-04-15 DIAGNOSIS — F329 Major depressive disorder, single episode, unspecified: Secondary | ICD-10-CM | POA: Diagnosis present

## 2019-04-15 DIAGNOSIS — R6889 Other general symptoms and signs: Secondary | ICD-10-CM

## 2019-04-15 DIAGNOSIS — R0602 Shortness of breath: Secondary | ICD-10-CM | POA: Diagnosis not present

## 2019-04-15 DIAGNOSIS — Z20822 Contact with and (suspected) exposure to covid-19: Secondary | ICD-10-CM | POA: Diagnosis not present

## 2019-04-15 DIAGNOSIS — K449 Diaphragmatic hernia without obstruction or gangrene: Secondary | ICD-10-CM | POA: Diagnosis present

## 2019-04-15 DIAGNOSIS — M47812 Spondylosis without myelopathy or radiculopathy, cervical region: Secondary | ICD-10-CM | POA: Diagnosis present

## 2019-04-15 DIAGNOSIS — R7303 Prediabetes: Secondary | ICD-10-CM | POA: Diagnosis present

## 2019-04-15 DIAGNOSIS — E669 Obesity, unspecified: Secondary | ICD-10-CM | POA: Diagnosis not present

## 2019-04-15 DIAGNOSIS — R531 Weakness: Secondary | ICD-10-CM | POA: Diagnosis not present

## 2019-04-15 DIAGNOSIS — E782 Mixed hyperlipidemia: Secondary | ICD-10-CM | POA: Diagnosis not present

## 2019-04-15 DIAGNOSIS — E876 Hypokalemia: Secondary | ICD-10-CM

## 2019-04-15 DIAGNOSIS — W19XXXA Unspecified fall, initial encounter: Secondary | ICD-10-CM | POA: Diagnosis not present

## 2019-04-15 DIAGNOSIS — R0902 Hypoxemia: Secondary | ICD-10-CM | POA: Diagnosis not present

## 2019-04-15 DIAGNOSIS — Z823 Family history of stroke: Secondary | ICD-10-CM

## 2019-04-15 DIAGNOSIS — Z6833 Body mass index (BMI) 33.0-33.9, adult: Secondary | ICD-10-CM

## 2019-04-15 LAB — POC SARS CORONAVIRUS 2 AG -  ED: SARS Coronavirus 2 Ag: NEGATIVE

## 2019-04-15 LAB — CBC WITH DIFFERENTIAL/PLATELET
Abs Immature Granulocytes: 0.04 10*3/uL (ref 0.00–0.07)
Basophils Absolute: 0 10*3/uL (ref 0.0–0.1)
Basophils Relative: 0 %
Eosinophils Absolute: 0 10*3/uL (ref 0.0–0.5)
Eosinophils Relative: 0 %
HCT: 30.7 % — ABNORMAL LOW (ref 36.0–46.0)
Hemoglobin: 9.5 g/dL — ABNORMAL LOW (ref 12.0–15.0)
Immature Granulocytes: 0 %
Lymphocytes Relative: 4 %
Lymphs Abs: 0.4 10*3/uL — ABNORMAL LOW (ref 0.7–4.0)
MCH: 28.1 pg (ref 26.0–34.0)
MCHC: 30.9 g/dL (ref 30.0–36.0)
MCV: 90.8 fL (ref 80.0–100.0)
Monocytes Absolute: 0.8 10*3/uL (ref 0.1–1.0)
Monocytes Relative: 8 %
Neutro Abs: 9.2 10*3/uL — ABNORMAL HIGH (ref 1.7–7.7)
Neutrophils Relative %: 88 %
Platelets: 210 10*3/uL (ref 150–400)
RBC: 3.38 MIL/uL — ABNORMAL LOW (ref 3.87–5.11)
RDW: 16.5 % — ABNORMAL HIGH (ref 11.5–15.5)
WBC: 10.5 10*3/uL (ref 4.0–10.5)
nRBC: 0 % (ref 0.0–0.2)

## 2019-04-15 LAB — COMPREHENSIVE METABOLIC PANEL
ALT: 28 U/L (ref 0–44)
AST: 25 U/L (ref 15–41)
Albumin: 2.8 g/dL — ABNORMAL LOW (ref 3.5–5.0)
Alkaline Phosphatase: 77 U/L (ref 38–126)
Anion gap: 9 (ref 5–15)
BUN: 20 mg/dL (ref 8–23)
CO2: 26 mmol/L (ref 22–32)
Calcium: 8.7 mg/dL — ABNORMAL LOW (ref 8.9–10.3)
Chloride: 102 mmol/L (ref 98–111)
Creatinine, Ser: 0.89 mg/dL (ref 0.44–1.00)
GFR calc Af Amer: >60 mL/min (ref >60)
GFR calc non Af Amer: >60 mL/min (ref >60)
Glucose, Bld: 162 mg/dL — ABNORMAL HIGH (ref 70–99)
Potassium: 3.3 mmol/L — ABNORMAL LOW (ref 3.5–5.1)
Sodium: 137 mmol/L (ref 135–145)
Total Bilirubin: 0.5 mg/dL (ref 0.3–1.2)
Total Protein: 6.3 g/dL — ABNORMAL LOW (ref 6.5–8.1)

## 2019-04-15 LAB — RESPIRATORY PANEL BY RT PCR (FLU A&B, COVID)
Influenza A by PCR: NEGATIVE
Influenza B by PCR: NEGATIVE
SARS Coronavirus 2 by RT PCR: NEGATIVE

## 2019-04-15 LAB — TROPONIN I (HIGH SENSITIVITY): Troponin I (High Sensitivity): 14 ng/L (ref <18)

## 2019-04-15 LAB — LIPASE, BLOOD: Lipase: 15 U/L (ref 11–51)

## 2019-04-15 LAB — MRSA PCR SCREENING: MRSA by PCR: NEGATIVE

## 2019-04-15 LAB — TSH: TSH: 0.497 u[IU]/mL (ref 0.350–4.500)

## 2019-04-15 MED ORDER — PAROXETINE HCL 20 MG PO TABS
20.0000 mg | ORAL_TABLET | Freq: Every day | ORAL | Status: DC
  Administered 2019-04-16 – 2019-04-17 (×2): 20 mg via ORAL
  Filled 2019-04-15 (×2): qty 1

## 2019-04-15 MED ORDER — DILTIAZEM HCL 25 MG/5ML IV SOLN: 20.0000 mg | Freq: Once | INTRAVENOUS | Status: DC

## 2019-04-15 MED ORDER — SODIUM CHLORIDE 0.9 % IV BOLUS
1000.0000 mL | Freq: Once | INTRAVENOUS | Status: AC
  Administered 2019-04-15: 16:00:00 1000 mL via INTRAVENOUS

## 2019-04-15 MED ORDER — PILOCARPINE HCL 1 % OP SOLN
1.0000 [drp] | Freq: Two times a day (BID) | OPHTHALMIC | Status: DC
  Administered 2019-04-15 – 2019-04-17 (×4): 1 [drp] via OPHTHALMIC
  Filled 2019-04-15: qty 15

## 2019-04-15 MED ORDER — DILTIAZEM HCL 25 MG/5ML IV SOLN
15.0000 mg | Freq: Once | INTRAVENOUS | Status: AC
  Administered 2019-04-15: 15 mg via INTRAVENOUS
  Filled 2019-04-15: qty 5

## 2019-04-15 MED ORDER — ONDANSETRON HCL 4 MG/2ML IJ SOLN: 4.0000 mg | Freq: Four times a day (QID) | INTRAMUSCULAR | Status: DC | PRN

## 2019-04-15 MED ORDER — PANTOPRAZOLE SODIUM 40 MG PO TBEC
40.0000 mg | DELAYED_RELEASE_TABLET | Freq: Every day | ORAL | Status: DC
  Administered 2019-04-16 – 2019-04-17 (×2): 40 mg via ORAL
  Filled 2019-04-15 (×3): qty 1

## 2019-04-15 MED ORDER — ASPIRIN EC 81 MG PO TBEC
81.0000 mg | DELAYED_RELEASE_TABLET | Freq: Every day | ORAL | Status: DC
  Administered 2019-04-15 – 2019-04-17 (×3): 81 mg via ORAL
  Filled 2019-04-15 (×3): qty 1

## 2019-04-15 MED ORDER — ACETAMINOPHEN 325 MG PO TABS: 650.0000 mg | ORAL_TABLET | ORAL | Status: DC | PRN

## 2019-04-15 MED ORDER — PROMETHAZINE-DM 6.25-15 MG/5ML PO SYRP: 5.0000 mL | ORAL_SOLUTION | Freq: Four times a day (QID) | ORAL | Status: DC | PRN

## 2019-04-15 MED ORDER — POTASSIUM CHLORIDE CRYS ER 20 MEQ PO TBCR
40.0000 meq | EXTENDED_RELEASE_TABLET | Freq: Once | ORAL | Status: AC
  Administered 2019-04-15: 40 meq via ORAL
  Filled 2019-04-15: qty 2

## 2019-04-15 MED ORDER — SODIUM CHLORIDE 0.9 % IV BOLUS
500.0000 mL | Freq: Once | INTRAVENOUS | Status: AC
  Administered 2019-04-15: 14:00:00 500 mL via INTRAVENOUS

## 2019-04-15 MED ORDER — PROMETHAZINE HCL 6.25 MG/5ML PO SYRP
6.2500 mg | ORAL_SOLUTION | Freq: Four times a day (QID) | ORAL | Status: DC | PRN
  Filled 2019-04-15: qty 5

## 2019-04-15 MED ORDER — SODIUM CHLORIDE 0.9 % IV SOLN
INTRAVENOUS | Status: DC | PRN
  Administered 2019-04-15: 22:00:00 250 mL via INTRAVENOUS

## 2019-04-15 MED ORDER — APIXABAN 5 MG PO TABS
5.0000 mg | ORAL_TABLET | Freq: Two times a day (BID) | ORAL | Status: DC
  Administered 2019-04-15 – 2019-04-17 (×4): 5 mg via ORAL
  Filled 2019-04-15 (×4): qty 1

## 2019-04-15 MED ORDER — ACETAMINOPHEN 500 MG PO TABS: 500.0000 mg | ORAL_TABLET | Freq: Four times a day (QID) | ORAL | Status: DC | PRN

## 2019-04-15 MED ORDER — DILTIAZEM HCL 25 MG/5ML IV SOLN
10.0000 mg | Freq: Once | INTRAVENOUS | Status: AC
  Administered 2019-04-15: 10 mg via INTRAVENOUS
  Filled 2019-04-15: qty 5

## 2019-04-15 MED ORDER — DILTIAZEM HCL-DEXTROSE 125-5 MG/125ML-% IV SOLN (PREMIX)
5.0000 mg/h | INTRAVENOUS | Status: DC
  Administered 2019-04-15 – 2019-04-16 (×2): 7.5 mg/h via INTRAVENOUS
  Filled 2019-04-15: qty 125

## 2019-04-15 MED ORDER — DEXTROMETHORPHAN POLISTIREX ER 30 MG/5ML PO SUER
15.0000 mg | Freq: Four times a day (QID) | ORAL | Status: DC | PRN
  Filled 2019-04-15: qty 5

## 2019-04-15 MED ORDER — DICLOFENAC SODIUM 75 MG PO TBEC
75.0000 mg | DELAYED_RELEASE_TABLET | Freq: Two times a day (BID) | ORAL | Status: DC
  Administered 2019-04-15 – 2019-04-17 (×4): 75 mg via ORAL
  Filled 2019-04-15 (×4): qty 1

## 2019-04-15 MED ORDER — DILTIAZEM HCL-DEXTROSE 125-5 MG/125ML-% IV SOLN (PREMIX)
5.0000 mg/h | INTRAVENOUS | Status: DC
  Administered 2019-04-15: 5 mg/h via INTRAVENOUS
  Filled 2019-04-15: qty 125

## 2019-04-15 NOTE — ED Provider Notes (Signed)
[facility] EMERGENCY DEPARTMENT Provider Note   CSN: [id number] Arrival date & time: 04/15/19  1150     History Chief Complaint  Patient presents with  . Weakness    79 y/o female with a PMH of GERD, HLD, and iron deficiency anemia presents to the ED c/o 1 week of generalized weakness, body aches, and chills. She states that starting Sunday night she began having body aches and chills and has expericed progressive generalized weakness. She states that on Wednesday she had a rapid covid test that was negative and denies sick contacts. She reports intermittent lightheadedness but no syncopal episodes. No CP, palpitaitons, SOB. She states that she has been producing less urine than normal, but denies dysuria or hematuria. She also states that she has had intermittent Nausea with 2 episodes of NBNB emesis, but states that this is typical for her with her GERD. No other complaints at this time.  HPI     Past Medical History:  Diagnosis Date  . Anxiety   . Chronic lower back pain   . Depression   . DJD (degenerative joint disease)    knees, neck  . GERD (gastroesophageal reflux disease)    has had dilitation in the past    Patient Active Problem List   Diagnosis Date Noted  . Elevated BP without diagnosis of hypertension 09/04/2018  . Esophageal stricture 09/04/2018  . Iron deficiency anemia 09/04/2018  . Continuous leakage of urine 02/17/2018  . Vitamin D deficiency 02/16/2018  . Hyperlipidemia, mixed 10/01/2017  . Abnormal glucose 09/30/2017  . Obesity (BMI 30.0-34.9) 09/29/2017  . History of colon polyps 09/29/2017  . GERD (gastroesophageal reflux disease) 04/01/2017  . DJD (degenerative joint disease)   . Chronic lower back pain   . Depression     Past Surgical History:  Procedure Laterality Date  . ABDOMINAL HYSTERECTOMY  03/1979  . BREAST BIOPSY Left 1986 and 1987  . CATARACT EXTRACTION, BILATERAL Bilateral 2014   Dr. Madelin Headings, in [place]     [place] History     No obstetric history on file.     Family History  Problem Relation Age of Onset  . Hypertension Mother   . Heart disease Mother   . Hypertension Father   . Heart disease Father   . Tremor Sister   . Dementia Brother   . Hypertension Daughter   . Colon cancer Neg Hx   . Esophageal cancer Neg Hx   . Rectal cancer Neg Hx   . Stomach cancer Neg Hx     Social History   Tobacco Use  . Smoking status: Never Smoker  . Smokeless tobacco: Never Used  Substance Use Topics  . Alcohol use: Yes    Alcohol/week: 3.0 standard drinks    Types: 3 Standard drinks or equivalent per week    Comment: 3 nights out of the week  . Drug use: No    Home Medications Prior to Admission medications   Medication Sig Start Date End Date Taking? Authorizing Provider  acetaminophen (TYLENOL) 500 MG tablet Take 500 mg by mouth every 6 (six) hours as needed.   Yes [provider]  Ascorbic Acid (VITAMIN C) 500 MG CAPS Take by mouth.   Yes [provider]  aspirin EC 81 MG tablet Take 81 mg by mouth daily.   Yes [provider]  azithromycin (ZITHROMAX) 250 MG tablet Take 2 tablets with Food on  Day 1, then 1 tablet Daily with Food for Infection Patient taking  differently: Take 250 mg by mouth daily. Take 2 tablets with Food on  Day 1, then 1 tablet Daily with Food for Infection 04/14/19  Yes [name], MD  CHOLECALCIFEROL PO Take 5,000 Units by mouth daily.   Yes [provider]  cyanocobalamin 1000 MCG tablet Take 1,000 mcg by mouth daily.   Yes [provider]  cyclobenzaprine (FLEXERIL) 10 MG tablet Take 1/2 to 1 tablet 2 to 3 x  /day if needed for Muscle Spasm 11/23/18  Yes [name], MD  dexamethasone (DECADRON) 4 MG tablet Take 1 tab 3 x day for  2 days, then 2 x day for  2 days, then 1 tab daily 04/14/19  Yes [name], MD  diclofenac (VOLTAREN) 75 MG EC tablet Take 1 tablet 2 x /day with Food for Pain & Inflammation Patient taking  differently: Take 75 mg by mouth 2 (two) times daily. Take 1 tablet 2 x /day with Food for Pain & Inflammation 01/05/19  Yes [name], MD  lansoprazole (PREVACID) 30 MG capsule Take 1 capsule Daily for Indigestion & Reflux Patient taking differently: Take 30 mg by mouth daily at 12 noon. Take 1 capsule Daily for Indigestion & Reflux 09/06/18  Yes [name], MD  Multiple Vitamins-Minerals (ZINC PO) Take 1 tablet by mouth daily.   Yes [provider]  PARoxetine (PAXIL) 20 MG tablet Take 1 tablet Daily for Mood Patient taking differently: Take 20 mg by mouth daily. Take 1 tablet Daily for Mood 11/23/18  Yes [name], MD  pilocarpine [facility]) 1 % ophthalmic solution 1 drop 2 (two) times daily.   Yes [provider]  promethazine-dextromethorphan (PROMETHAZINE-DM) 6.25-15 MG/5ML syrup Take 1 to 2 tsp enery 4 hours if needed for cough 04/14/19  Yes [name], MD  dicyclomine (BENTYL) 10 MG capsule Take 1 tab three times a day by mouth with meals as needed for abdominal cramping. Patient not taking: Reported on 04/15/2019 11/28/18   [name], NP    Allergies    Patient has no known allergies.  Review of Systems   Review of Systems Ten systems reviewed and are negative for acute change, except as noted in the HPI.   Physical Exam Updated Vital Signs BP 121/73   Pulse (!) 57   Temp 98.4 F (36.9 C)   Resp (!) 22   Ht 5\' 9"  (1.753 m)   SpO2 94%   BMI 33.34 kg/m   Physical Exam Vitals and nursing note reviewed.  Constitutional:      General: She is not in acute distress.    Appearance: She is well-developed. She is not diaphoretic.  HENT:     Head: Normocephalic and atraumatic.  Eyes:     General: No scleral icterus.    Conjunctiva/sclera: Conjunctivae normal.  Cardiovascular:     Rate and Rhythm: Tachycardia present. Rhythm irregular.     Heart sounds: Normal heart sounds. No murmur. No friction rub. No gallop.   Pulmonary:      Effort: Pulmonary effort is normal. No respiratory distress.     Breath sounds: Normal breath sounds.  Abdominal:     General: Bowel sounds are normal. There is no distension.     Palpations: Abdomen is soft. There is no mass.     Tenderness: There is no abdominal tenderness. There is no guarding.  Musculoskeletal:     Cervical back: Normal range of motion.  Skin:    General: Skin is warm and dry.  Neurological:  Mental Status: She is alert and oriented to person, place, and time.  Psychiatric:        Behavior: Behavior normal.     ED Results / Procedures / Treatments   Labs (all labs ordered are listed, but only abnormal results are displayed) Labs Reviewed  COMPREHENSIVE METABOLIC PANEL - Abnormal; Notable for the following components:      Result Value   Potassium 3.3 (*)    Glucose, Bld 162 (*)    Calcium 8.7 (*)    Total Protein 6.3 (*)    Albumin 2.8 (*)    All other components within normal limits  CBC WITH DIFFERENTIAL/PLATELET - Abnormal; Notable for the following components:   RBC 3.38 (*)    Hemoglobin 9.5 (*)    HCT 30.7 (*)    RDW 16.5 (*)    Neutro Abs 9.2 (*)    Lymphs Abs 0.4 (*)    All other components within normal limits  RESPIRATORY PANEL BY RT PCR (FLU A&B, COVID)  LIPASE, BLOOD  TSH  URINALYSIS, ROUTINE W REFLEX MICROSCOPIC  POC SARS CORONAVIRUS 2 AG -  ED  TROPONIN I (HIGH SENSITIVITY)    EKG EKG Interpretation  Date/Time:  Saturday April 15 2019 12:09:30 EST Ventricular Rate:  152 PR Interval:    QRS Duration: 100 QT Interval:  306 QTC Calculation: 487 R Axis:   58 Text Interpretation: Atrial fibrillation with rapid V-rate Probable anteroseptal infarct, old Repolarization abnormality, prob rate related No old tracing to compare Confirmed by Sherwood Gambler [phone]) on 04/15/2019 12:36:17 PM   Radiology DG Chest Port 1 View  Result Date: 04/15/2019 CLINICAL DATA:  Shortness of breath for 1 week. Nausea. Dizziness and weakness.  EXAM: PORTABLE CHEST 1 VIEW COMPARISON:  03/31/2017 FINDINGS: The heart size and mediastinal contours are within normal limits. Both lungs are clear. Increased size of small to moderate hiatal hernia is seen in the right cardiophrenic angle. IMPRESSION: 1. No active cardiopulmonary disease. 2. Increased size of small to moderate hiatal hernia. Electronically Signed   By: Marlaine Hind M.D.   On: 04/15/2019 13:06    Procedures .Critical Care Performed by: Margarita Mail, PA-C Authorized by: Margarita Mail, PA-C   Critical care provider statement:    Critical care time (minutes):  50   Critical care time was exclusive of:  Separately billable procedures and treating other patients and teaching time   Critical care was necessary to treat or prevent imminent or life-threatening deterioration of the following conditions:  Cardiac failure   Critical care was time spent personally by me on the following activities:  Discussions with consultants, evaluation of patient's response to treatment, examination of patient, ordering and performing treatments and interventions, ordering and review of laboratory studies, ordering and review of radiographic studies, pulse oximetry, re-evaluation of patient's condition, obtaining history from patient or surrogate and review of old charts   (including critical care time)  Medications Ordered in ED Medications  diltiazem (CARDIZEM) 125 mg in dextrose 5% 125 mL (1 mg/mL) infusion (5 mg/hr Intravenous New Bag/Given 04/15/19 1658)  diltiazem (CARDIZEM) injection 10 mg (10 mg Intravenous Given 04/15/19 1400)  sodium chloride 0.9 % bolus 500 mL (0 mLs Intravenous Stopped 04/15/19 1535)  potassium chloride SA (KLOR-CON) CR tablet 40 mEq (40 mEq Oral Given 04/15/19 1400)  sodium chloride 0.9 % bolus 1,000 mL (0 mLs Intravenous Stopped 04/15/19 1718)  diltiazem (CARDIZEM) injection 15 mg (15 mg Intravenous Given 04/15/19 1548)    ED Course  I  have reviewed the triage vital  signs and the nursing notes.  Pertinent labs & imaging results that were available during my care of the patient were reviewed by me and considered in my medical decision making (see chart for details).  Clinical Course as of Apr 14 1799  Sat Apr 15, 2019  1221 EKG reviewed shoes Afib with RVR at a rate of 152    [AH]  1316 Troponin I (High Sensitivity): 14 [AH]  1316 Lipase: 15 [AH]  1317 Hemoglobin(!): 9.5 [AH]  1421 Cardiac monitoring reveals AFIB at a rate of 110, as reviewed and interpreted by me. Cardiac monitoring was ordered due to new AFIB and to monitor patient for dysrhythmia.     [AH]  [id number] 3:56 PM Cardiac monitoring reveals AFIB, rate of 150, as reviewed and interpreted by me. Cardiac monitoring was ordered due to sob and to monitor patient for dysrhythmia.     [AH]    Clinical Course User Index [AH] Margarita Mail, PA-C   MDM Rules/Calculators/A&P     CHA2DS2-VASc Score: 3                 CC:flu like illness, vomiting, SOB VS:  Vitals:   04/15/19 1558 04/15/19 1615 04/15/19 1645 04/15/19 1700  BP:  101/70 111/84 121/73  Pulse:   (!) 122 (!) 57  Resp: (!) 25 (!) 25 17 (!) 22  Temp:      SpO2:   98% 94%  Height:       [id number] is gathered by patient  and EMR. DDX:The emergent differential diagnosis for shortness of breath includes, but is not limited to, Pulmonary edema, bronchoconstriction, Pneumonia, Pulmonary embolism, Pneumotherax/ Hemothorax, Dysrythmia, ACS.  Labs: I reviewed the labs which show CMP shows mild hypokalemia treated here with oral potassium.  Glucose mildly elevated at 162.  CBC shows hemoglobin of 9.5 at baseline.  Mild neutrophilia and lymphocytopenia.  Lipase within normal limits, troponin within normal limits, TSH is within normal limits.  Point-of-care Covid test is negative.  Confirmatory PCR test is pending. Imaging: I personally reviewed the images (1 view chest x-ray) which show(s) worsening hiatal hernia without any other  abnormality on my interpretation EKG: A. fib with RVR at a rate of 110 MDM: Patient with new onset A. fib.  Blood pressures were mildly soft.  I have ordered Cardizem twice without improvement.  Placed on Cardizem drip with improvement.  Patient will be admitted by the hospitalist service.   Patient disposition: Admit Patient condition: Stable. The patient appears reasonably stabilized for admission considering the current resources, flow, and capabilities available in the ED at this time, and I doubt any other [facility] requiring further screening and/or treatment in the ED prior to admission.  Final Clinical Impression(s) / ED Diagnoses Final diagnoses:  New onset a-fib ([place])  Flu-like symptoms    Rx / DC Orders ED Discharge Orders    None       Margarita Mail, PA-C 04/15/19 [place], MD 04/16/19 [phone]

## 2019-04-15 NOTE — ED Triage Notes (Signed)
Pt c/o of weakness, nausea, dizziness, sob with exertion since Sunday

## 2019-04-15 NOTE — H&P (Signed)
History and Physical  Darlene [name] [id number] DOB: [birth year] DOA: 04/15/2019  Referring physician: Margarita Mail, Hershal Coria, ED physician PCP: [name], MD  Outpatient Specialists:   Patient Coming From: home  Chief Complaint: SOB, weakness  HPI: Darlene [name] is a 79 y.o. female with a history of GERD, depression, chronic back pain.  Patient seen for worsening shortness of breath and weakness over the past week.  Symptoms are worse with ambulation and improved with rest.  She does admit to body aches that started 5 days ago.  She did have a rapid Covid test that was negative.  She has had no sick contacts.  Emergency Department Course: Patient noted to be in atrial fibrillation with RVR.  Heart rate initially 122.  Other than hemoglobin of 9.8, blood work otherwise fairly normal.  TSH normal.  Review of Systems:   Pt denies any nausea, vomiting, diarrhea, constipation, abdominal pain, palpitations, headache, vision changes, lightheadedness, dizziness, melena, rectal bleeding.  Review of systems are otherwise negative  Past Medical History:  Diagnosis Date  . Anxiety   . Chronic lower back pain   . Depression   . DJD (degenerative joint disease)    knees, neck  . GERD (gastroesophageal reflux disease)    has had dilitation in the past   Past Surgical History:  Procedure Laterality Date  . ABDOMINAL HYSTERECTOMY  03/1979  . BREAST BIOPSY Left 1986 and 1987  . CATARACT EXTRACTION, BILATERAL Bilateral 2014   Dr. Madelin Headings, in [place] History:  reports that she has never smoked. She has never used smokeless tobacco. She reports current alcohol use of about 3.0 standard drinks of alcohol per week. She reports that she does not use drugs. Patient lives at home  No Known Allergies  Family History  Problem Relation Age of Onset  . Hypertension Mother   . Heart disease Mother   . Hypertension Father   . Heart disease Father   . Tremor Sister   .  Dementia Brother   . Hypertension Daughter   . Colon cancer Neg Hx   . Esophageal cancer Neg Hx   . Rectal cancer Neg Hx   . Stomach cancer Neg Hx       Prior to Admission medications   Medication Sig Start Date End Date Taking? Authorizing Provider  acetaminophen (TYLENOL) 500 MG tablet Take 500 mg by mouth every 6 (six) hours as needed.   Yes [provider]  Ascorbic Acid (VITAMIN C) 500 MG CAPS Take by mouth.   Yes [provider]  aspirin EC 81 MG tablet Take 81 mg by mouth daily.   Yes [provider]  azithromycin (ZITHROMAX) 250 MG tablet Take 2 tablets with Food on  Day 1, then 1 tablet Daily with Food for Infection Patient taking differently: Take 250 mg by mouth daily. Take 2 tablets with Food on  Day 1, then 1 tablet Daily with Food for Infection 04/14/19  Yes [name], MD  CHOLECALCIFEROL PO Take 5,000 Units by mouth daily.   Yes [provider]  cyanocobalamin 1000 MCG tablet Take 1,000 mcg by mouth daily.   Yes [provider]  cyclobenzaprine (FLEXERIL) 10 MG tablet Take 1/2 to 1 tablet 2 to 3 x  /day if needed for Muscle Spasm 11/23/18  Yes [name], MD  dexamethasone (DECADRON) 4 MG tablet Take 1 tab 3 x day for  2 days, then 2 x day for  2 days, then 1  tab daily 04/14/19  Yes [name], MD  diclofenac (VOLTAREN) 75 MG EC tablet Take 1 tablet 2 x /day with Food for Pain & Inflammation Patient taking differently: Take 75 mg by mouth 2 (two) times daily. Take 1 tablet 2 x /day with Food for Pain & Inflammation 01/05/19  Yes [name], MD  lansoprazole (PREVACID) 30 MG capsule Take 1 capsule Daily for Indigestion & Reflux Patient taking differently: Take 30 mg by mouth daily at 12 noon. Take 1 capsule Daily for Indigestion & Reflux 09/06/18  Yes [name], MD  Multiple Vitamins-Minerals (ZINC PO) Take 1 tablet by mouth daily.   Yes [provider]  PARoxetine (PAXIL) 20 MG tablet Take 1 tablet  Daily for Mood Patient taking differently: Take 20 mg by mouth daily. Take 1 tablet Daily for Mood 11/23/18  Yes [name], MD  pilocarpine [facility]) 1 % ophthalmic solution 1 drop 2 (two) times daily.   Yes [provider]  promethazine-dextromethorphan (PROMETHAZINE-DM) 6.25-15 MG/5ML syrup Take 1 to 2 tsp enery 4 hours if needed for cough 04/14/19  Yes [name], MD  dicyclomine (BENTYL) 10 MG capsule Take 1 tab three times a day by mouth with meals as needed for abdominal cramping. Patient not taking: Reported on 04/15/2019 11/28/18   [name], NP    Physical Exam: BP 109/71   Pulse (!) 117   Temp 98.4 F (36.9 C)   Resp 20   Ht 5\' 9"  (1.753 m)   SpO2 95%   BMI 33.34 kg/m   . General: Elderly female. Awake and alert and oriented x3. No acute cardiopulmonary distress.  Marland Kitchen HEENT: Normocephalic atraumatic.  Right and left ears normal in appearance.  Pupils equal, round, reactive to light. Extraocular muscles are intact. Sclerae anicteric and noninjected.  Moist mucosal membranes. No mucosal lesions.  . Neck: Neck supple without lymphadenopathy. No carotid bruits. No masses palpated.  . Cardiovascular: Irregularly irregular rate. No murmurs, rubs, gallops auscultated. No JVD.  Marland Kitchen Respiratory: Good respiratory effort with no wheezes, rales, rhonchi. Lungs clear to auscultation bilaterally.  No accessory muscle use. . Abdomen: Soft, nontender, nondistended. Active bowel sounds. No masses or hepatosplenomegaly  . Skin: No rashes, lesions, or ulcerations.  Dry, warm to touch. 2+ dorsalis pedis and radial pulses. . Musculoskeletal: No calf or leg pain. All major joints not erythematous nontender.  No upper or lower joint deformation.  Good ROM.  No contractures  . Psychiatric: Intact judgment and insight. Pleasant and cooperative. . Neurologic: No focal neurological deficits. Strength is 5/5 and symmetric in upper and lower extremities.  Cranial nerves II through XII are  grossly intact.           Labs on Admission: I have personally reviewed following labs and imaging studies  CBC: Recent Labs  Lab 04/15/19 1216  WBC 10.5  NEUTROABS 9.2*  HGB 9.5*  HCT 30.7*  MCV 90.8  PLT [id number]   Basic Metabolic Panel: Recent Labs  Lab 04/15/19 1216  NA 137  K 3.3*  CL 102  CO2 26  GLUCOSE 162*  BUN 20  CREATININE 0.89  CALCIUM 8.7*   GFR: Estimated Creatinine Clearance: 65.3 mL/min (by C-G formula based on SCr of 0.89 mg/dL). Liver Function Tests: Recent Labs  Lab 04/15/19 1216  AST 25  ALT 28  ALKPHOS 77  BILITOT 0.5  PROT 6.3*  ALBUMIN 2.8*   Recent Labs  Lab 04/15/19 1216  LIPASE 15   No results for input(s): AMMONIA  in the last 168 hours. Coagulation Profile: No results for input(s): INR, PROTIME in the last 168 hours. Cardiac Enzymes: No results for input(s): CKTOTAL, CKMB, CKMBINDEX, TROPONINI in the last 168 hours. BNP (last 3 results) No results for input(s): PROBNP in the last 8760 hours. HbA1C: No results for input(s): HGBA1C in the last 72 hours. CBG: No results for input(s): GLUCAP in the last 168 hours. Lipid Profile: No results for input(s): CHOL, HDL, LDLCALC, TRIG, CHOLHDL, LDLDIRECT in the last 72 hours. Thyroid Function Tests: Recent Labs    04/15/19 1216  TSH 0.497   Anemia Panel: No results for input(s): VITAMINB12, FOLATE, FERRITIN, TIBC, IRON, RETICCTPCT in the last 72 hours. Urine analysis:    Component Value Date/Time   COLORURINE YELLOW 02/13/2019 1032   APPEARANCEUR CLEAR 02/13/2019 1032   LABSPEC 1.015 02/13/2019 1032   PHURINE 6.0 02/13/2019 1032   GLUCOSEU NEGATIVE 02/13/2019 1032   HGBUR NEGATIVE 02/13/2019 1032   [place] 02/13/2019 1032   PROTEINUR NEGATIVE 02/13/2019 1032   NITRITE NEGATIVE 09/01/2018 1110   LEUKOCYTESUR TRACE (A) 09/01/2018 1110   Sepsis Labs: @LABRCNTIP (procalcitonin:4,lacticidven:4) ) Recent Results (from the past 240 hour(s))  Respiratory Panel by RT  PCR (Flu A&B, Covid) - Nasopharyngeal Swab     Status: None   Collection Time: 04/15/19  4:44 PM   Specimen: Nasopharyngeal Swab  Result Value Ref Range Status   SARS Coronavirus 2 by RT PCR NEGATIVE NEGATIVE Final    Comment: (NOTE) SARS-CoV-2 target nucleic acids are NOT DETECTED. The SARS-CoV-2 RNA is generally detectable in upper respiratoy specimens during the acute phase of infection. The lowest concentration of SARS-CoV-2 viral copies this assay can detect is 131 copies/mL. A negative result does not preclude SARS-Cov-2 infection and should not be used as the sole basis for treatment or other patient management decisions. A negative result may occur with  improper specimen collection/handling, submission of specimen other than nasopharyngeal swab, presence of viral mutation(s) within the areas targeted by this assay, and inadequate number of viral copies (<131 copies/mL). A negative result must be combined with clinical observations, patient history, and epidemiological information. The expected result is Negative. Fact Sheet for Patients:  PinkCheek.be Fact Sheet for Healthcare Providers:  GravelBags.it This test is not yet ap proved or cleared by the [place] FDA and  has been authorized for detection and/or diagnosis of SARS-CoV-2 by FDA under an Emergency Use Authorization (EUA). This EUA will remain  in effect (meaning this test can be used) for the duration of the COVID-19 declaration under Section 564(b)(1) of the Act, 21 U.S.C. section 360bbb-3(b)(1), unless the authorization is terminated or revoked sooner.    Influenza A by PCR NEGATIVE NEGATIVE Final   Influenza B by PCR NEGATIVE NEGATIVE Final    Comment: (NOTE) The Xpert Xpress SARS-CoV-2/FLU/RSV assay is intended as an aid in  the diagnosis of influenza from Nasopharyngeal swab specimens and  should not be used as a sole basis for treatment. Nasal  washings and  aspirates are unacceptable for Xpert Xpress SARS-CoV-2/FLU/RSV  testing. Fact Sheet for Patients: PinkCheek.be Fact Sheet for Healthcare Providers: GravelBags.it This test is not yet approved or cleared by the [place] FDA and  has been authorized for detection and/or diagnosis of SARS-CoV-2 by  FDA under an Emergency Use Authorization (EUA). This EUA will remain  in effect (meaning this test can be used) for the duration of the  Covid-19 declaration under Section 564(b)(1) of the Act, 21  U.S.C. section 360bbb-3(b)(1),  unless the authorization is  terminated or revoked. Performed at [facility], [address] [place], [place] [address]      Radiological Exams on Admission: DG Chest [facility] 1 View  Result Date: 04/15/2019 CLINICAL DATA:  Shortness of breath for 1 week. Nausea. Dizziness and weakness. EXAM: PORTABLE CHEST 1 VIEW COMPARISON:  03/31/2017 FINDINGS: The heart size and mediastinal contours are within normal limits. Both lungs are clear. Increased size of small to moderate hiatal hernia is seen in the right cardiophrenic angle. IMPRESSION: 1. No active cardiopulmonary disease. 2. Increased size of small to moderate hiatal hernia. Electronically Signed   By: Marlaine Hind M.D.   On: 04/15/2019 13:06    EKG: Independently reviewed.  Atrial fibrillation.  No acute ST changes.  Assessment/Plan: Principal Problem:   Atrial fibrillation with RVR (HCC) Active Problems:   Chronic lower back pain   GERD (gastroesophageal reflux disease)   Obesity (BMI 30.0-34.9)   Hyperlipidemia, mixed    This patient was discussed with the ED physician, including pertinent vitals, physical exam findings, labs, and imaging.  We also discussed care given by the ED provider.  1. Atrial fibrillation with RVR a. Observation in stepdown unit b. Cardizem drip c. Echo in the morning d. TSH already done e. Lipids, hemoglobin  A1c, repeat BMP in a.m. f. Start Eliquis 2. GERD a. Protonix 3. Chronic back pain a. Continue home regimen 4. Hyperlipidemia a. Continue home regimen 5. Obesity  DVT prophylaxis: Eliquis Consultants: None Code Status: Full code Family Communication: None Disposition Plan: Patient to return home following evaluation   Truett Mainland, DO

## 2019-04-15 NOTE — ED Notes (Signed)
Pt was informed that we need a urine sample. 

## 2019-04-15 NOTE — ED Notes (Signed)
ED Provider at bedside. 

## 2019-04-16 ENCOUNTER — Observation Stay (HOSPITAL_BASED_OUTPATIENT_CLINIC_OR_DEPARTMENT_OTHER): Payer: Medicare Other

## 2019-04-16 ENCOUNTER — Encounter (HOSPITAL_COMMUNITY): Payer: Self-pay

## 2019-04-16 DIAGNOSIS — Z8249 Family history of ischemic heart disease and other diseases of the circulatory system: Secondary | ICD-10-CM | POA: Diagnosis not present

## 2019-04-16 DIAGNOSIS — I4891 Unspecified atrial fibrillation: Secondary | ICD-10-CM | POA: Diagnosis present

## 2019-04-16 DIAGNOSIS — Z823 Family history of stroke: Secondary | ICD-10-CM | POA: Diagnosis not present

## 2019-04-16 DIAGNOSIS — Z6833 Body mass index (BMI) 33.0-33.9, adult: Secondary | ICD-10-CM | POA: Diagnosis not present

## 2019-04-16 DIAGNOSIS — K219 Gastro-esophageal reflux disease without esophagitis: Secondary | ICD-10-CM | POA: Diagnosis present

## 2019-04-16 DIAGNOSIS — E782 Mixed hyperlipidemia: Secondary | ICD-10-CM | POA: Diagnosis present

## 2019-04-16 DIAGNOSIS — D509 Iron deficiency anemia, unspecified: Secondary | ICD-10-CM | POA: Diagnosis present

## 2019-04-16 DIAGNOSIS — I361 Nonrheumatic tricuspid (valve) insufficiency: Secondary | ICD-10-CM | POA: Diagnosis not present

## 2019-04-16 DIAGNOSIS — I340 Nonrheumatic mitral (valve) insufficiency: Secondary | ICD-10-CM

## 2019-04-16 DIAGNOSIS — Z20822 Contact with and (suspected) exposure to covid-19: Secondary | ICD-10-CM | POA: Diagnosis present

## 2019-04-16 DIAGNOSIS — E876 Hypokalemia: Secondary | ICD-10-CM | POA: Diagnosis present

## 2019-04-16 DIAGNOSIS — G8929 Other chronic pain: Secondary | ICD-10-CM | POA: Diagnosis present

## 2019-04-16 DIAGNOSIS — I517 Cardiomegaly: Secondary | ICD-10-CM | POA: Diagnosis not present

## 2019-04-16 DIAGNOSIS — R0602 Shortness of breath: Secondary | ICD-10-CM | POA: Diagnosis not present

## 2019-04-16 DIAGNOSIS — F329 Major depressive disorder, single episode, unspecified: Secondary | ICD-10-CM | POA: Diagnosis present

## 2019-04-16 DIAGNOSIS — K449 Diaphragmatic hernia without obstruction or gangrene: Secondary | ICD-10-CM | POA: Diagnosis present

## 2019-04-16 DIAGNOSIS — Z7982 Long term (current) use of aspirin: Secondary | ICD-10-CM | POA: Diagnosis not present

## 2019-04-16 DIAGNOSIS — M47812 Spondylosis without myelopathy or radiculopathy, cervical region: Secondary | ICD-10-CM | POA: Diagnosis present

## 2019-04-16 DIAGNOSIS — M5442 Lumbago with sciatica, left side: Secondary | ICD-10-CM | POA: Diagnosis not present

## 2019-04-16 DIAGNOSIS — N390 Urinary tract infection, site not specified: Secondary | ICD-10-CM | POA: Diagnosis present

## 2019-04-16 DIAGNOSIS — R7303 Prediabetes: Secondary | ICD-10-CM | POA: Diagnosis present

## 2019-04-16 DIAGNOSIS — M545 Low back pain: Secondary | ICD-10-CM | POA: Diagnosis present

## 2019-04-16 DIAGNOSIS — E669 Obesity, unspecified: Secondary | ICD-10-CM | POA: Diagnosis present

## 2019-04-16 LAB — MAGNESIUM: Magnesium: 2 mg/dL (ref 1.7–2.4)

## 2019-04-16 LAB — BASIC METABOLIC PANEL
Anion gap: 11 (ref 5–15)
BUN: 19 mg/dL (ref 8–23)
CO2: 26 mmol/L (ref 22–32)
Calcium: 9 mg/dL (ref 8.9–10.3)
Chloride: 103 mmol/L (ref 98–111)
Creatinine, Ser: 0.69 mg/dL (ref 0.44–1.00)
GFR calc Af Amer: >60 mL/min (ref >60)
GFR calc non Af Amer: >60 mL/min (ref >60)
Glucose, Bld: 159 mg/dL — ABNORMAL HIGH (ref 70–99)
Potassium: 3.7 mmol/L (ref 3.5–5.1)
Sodium: 140 mmol/L (ref 135–145)

## 2019-04-16 LAB — URINALYSIS, ROUTINE W REFLEX MICROSCOPIC
Bilirubin Urine: NEGATIVE
Glucose, UA: NEGATIVE mg/dL
Ketones, ur: NEGATIVE mg/dL
Nitrite: NEGATIVE
Protein, ur: NEGATIVE mg/dL
Specific Gravity, Urine: 1.012 (ref 1.005–1.030)
WBC, UA: >50 WBC/hpf — ABNORMAL HIGH (ref 0–5)
pH: 6 (ref 5.0–8.0)

## 2019-04-16 LAB — LIPID PANEL
Cholesterol: 162 mg/dL (ref 0–200)
HDL: 32 mg/dL — ABNORMAL LOW (ref >40)
LDL Cholesterol: 110 mg/dL — ABNORMAL HIGH (ref 0–99)
Total CHOL/HDL Ratio: 5.1 RATIO
Triglycerides: 100 mg/dL (ref <150)
VLDL: 20 mg/dL (ref 0–40)

## 2019-04-16 LAB — HEMOGLOBIN A1C
Hgb A1c MFr Bld: 5.6 % (ref 4.8–5.6)
Mean Plasma Glucose: 114.02 mg/dL

## 2019-04-16 LAB — ECHOCARDIOGRAM COMPLETE
Height: 70 in
Weight: 3566.16 oz

## 2019-04-16 MED ORDER — SODIUM CHLORIDE 0.9% FLUSH
3.0000 mL | Freq: Two times a day (BID) | INTRAVENOUS | Status: DC
  Administered 2019-04-16 – 2019-04-17 (×2): 3 mL via INTRAVENOUS

## 2019-04-16 MED ORDER — CHLORHEXIDINE GLUCONATE CLOTH 2 % EX PADS
6.0000 | MEDICATED_PAD | Freq: Every day | CUTANEOUS | Status: DC
  Administered 2019-04-16 – 2019-04-17 (×2): 6 via TOPICAL

## 2019-04-16 MED ORDER — FERROUS SULFATE 325 (65 FE) MG PO TABS
325.0000 mg | ORAL_TABLET | Freq: Every day | ORAL | Status: DC
  Administered 2019-04-16 – 2019-04-17 (×2): 325 mg via ORAL
  Filled 2019-04-16 (×2): qty 1

## 2019-04-16 MED ORDER — DILTIAZEM HCL 60 MG PO TABS
60.0000 mg | ORAL_TABLET | Freq: Four times a day (QID) | ORAL | Status: DC
  Administered 2019-04-16 – 2019-04-17 (×5): 60 mg via ORAL
  Filled 2019-04-16 (×5): qty 1

## 2019-04-16 MED ORDER — SODIUM CHLORIDE 0.9 % IV SOLN
510.0000 mg | Freq: Once | INTRAVENOUS | Status: AC
  Administered 2019-04-16: 510 mg via INTRAVENOUS
  Filled 2019-04-16: qty 17
  Filled 2019-04-16: qty 510

## 2019-04-16 MED ORDER — POTASSIUM CHLORIDE CRYS ER 20 MEQ PO TBCR
20.0000 meq | EXTENDED_RELEASE_TABLET | Freq: Once | ORAL | Status: AC
  Administered 2019-04-16: 14:00:00 20 meq via ORAL
  Filled 2019-04-16: qty 1

## 2019-04-16 MED ORDER — SODIUM CHLORIDE 0.9 % IV SOLN
1.0000 g | INTRAVENOUS | Status: DC
  Administered 2019-04-16: 14:00:00 1 g via INTRAVENOUS
  Filled 2019-04-16: qty 10

## 2019-04-16 NOTE — Progress Notes (Signed)
Obtained urine culture specimen via straight catheterization as ordered. Pt tolerated well. Obtained 49mL of clear amber urine with strong odor. Peri-care given. Pt currently brushing teeth and washing face. VSS. SpO2 98% on room air. Voices no pain at this time. Resting in bed with call light within reach. Will cont to mx.

## 2019-04-16 NOTE — Progress Notes (Addendum)
PROGRESS NOTE  Darlene [name] [id number] DOB: 1940-07-01 DOA: 04/15/2019 PCP: [name], MD  Brief History:  79 year old female with a history of iron deficiency anemia, hyperlipidemia, anxiety, impaired glucose tolerance, chronic back pain presenting with 1 week history of intermittent shortness of breath, generalized weakness, and myalgias.  The patient has also had some subjective fevers and chills.  She went to obtain a " rapid Covid test" on 04/11/2018 which was negative.  She subsequently developed some lightheadedness and nausea and vomiting on 04/13/2018.  However, she states that she normally has intermittent nausea and vomiting which she attributes to her hiatal hernia and GERD.  In addition, the patient has describes some intermittent chest discomfort and dyspnea on exertion this past week.  She denies any coughing, hemoptysis, headache, visual disturbance, abdominal pain, diarrhea, hematochezia, melena, dysuria, hematuria.  She denies any Covid contacts.  She denies any new medications. In the emergency department, the patient was afebrile hemodynamically stable.  EKG showed atrial fibrillation with RVR with a heart rate to 150.  Oxygen saturation was 99% on room air.  BMP showed a potassium 3.3, but was otherwise unremarkable.  CBC showed WBC 10.5, hemoglobin 9.5, platelets 210,000.  Urinalysis > 50 WBC.  TSH 0.497.  EKG showed atrial fibrillation with nonspecific STT wave changes.  Assessment/Plan: Atrial fibrillation with RVR, type unspecified -Transition to oral diltiazem today if possible -Echocardiogram -TSH 0.497 -CHADSVASc 3-4 (?HTN, age, female) -Start apixaban -Personally reviewed EKG--atrial fibrillation, nonspecific ST-T wave change -Personally reviewed chest x-ray--no edema or infiltrates  Pyuria -Concern for UTI -Obtain urine culture  Hypokalemia -Replete -Check magnesium  GERD/hiatal hernia -Start Protonix -Patient states that this has  resulted in intermittent nausea vomiting -Discussed the risks, benefits, and alternatives of chronic Voltaren use which may be exacerbating her nausea and vomiting--she has been on it for 4 years  Iron deficiency anemia -04/03/2019 iron saturation 7%, ferritin 5 -Feraheme x1 -start po ferrous sulfate  Depression -continue paxil      Disposition Plan:   Home 04/17/19 if HR controlled and work up unremarkable  Family Communication:  No Family at bedside  Consultants:  none  Code Status:  FULL  DVT Prophylaxis:  Apixaban   Procedures: As Listed in Progress Note Above  Antibiotics: None       Subjective: Patient denies fevers, chills, headache, chest pain, dyspnea, nausea, vomiting, diarrhea, abdominal pain, dysuria, hematuria, hematochezia, and melena.   Objective: Vitals:   04/16/19 0343 04/16/19 0353 04/16/19 0400 04/16/19 0418  BP:      Pulse: 89 82  83  Resp: 18 19  10   Temp:   (!) 97.4 F (36.3 C)   TempSrc:   Oral   SpO2: (!) 89% (!) 87%  (!) 74%  Weight:      Height:        Intake/Output Summary (Last 24 hours) at 04/16/2019 [id number] Last data filed at 04/16/2019 [id number] Gross per 24 hour  Intake 2174.29 ml  Output 250 ml  Net 1924.29 ml   Weight change:  Exam:   General:  Pt is alert, follows commands appropriately, not in acute distress  HEENT: No icterus, No thrush, No neck mass, [place]/AT  Cardiovascular: IRRR, S1/S2, no rubs, no gallops  Respiratory: CTA bilaterally, no wheezing, no crackles, no rhonchi  Abdomen: Soft/+BS, non tender, non distended, no guarding  Extremities: No edema, No lymphangitis, No petechiae, No rashes, no synovitis   Data Reviewed: I have  personally reviewed following labs and imaging studies Basic Metabolic Panel: Recent Labs  Lab 04/15/19 1216  NA 137  K 3.3*  CL 102  CO2 26  GLUCOSE 162*  BUN 20  CREATININE 0.89  CALCIUM 8.7*   Liver Function Tests: Recent Labs  Lab 04/15/19 1216  AST 25  ALT 28    ALKPHOS 77  BILITOT 0.5  PROT 6.3*  ALBUMIN 2.8*   Recent Labs  Lab 04/15/19 1216  LIPASE 15   No results for input(s): AMMONIA in the last 168 hours. Coagulation Profile: No results for input(s): INR, PROTIME in the last 168 hours. CBC: Recent Labs  Lab 04/15/19 1216  WBC 10.5  NEUTROABS 9.2*  HGB 9.5*  HCT 30.7*  MCV 90.8  PLT 210   Cardiac Enzymes: No results for input(s): CKTOTAL, CKMB, CKMBINDEX, TROPONINI in the last 168 hours. BNP: Invalid input(s): POCBNP CBG: No results for input(s): GLUCAP in the last 168 hours. HbA1C: No results for input(s): HGBA1C in the last 72 hours. Urine analysis:    Component Value Date/Time   COLORURINE YELLOW 04/16/2019 0055   APPEARANCEUR HAZY (A) 04/16/2019 0055   LABSPEC 1.012 04/16/2019 0055   PHURINE 6.0 04/16/2019 0055   GLUCOSEU NEGATIVE 04/16/2019 0055   HGBUR SMALL (A) 04/16/2019 0055   BILIRUBINUR NEGATIVE 04/16/2019 0055   KETONESUR NEGATIVE 04/16/2019 0055   PROTEINUR NEGATIVE 04/16/2019 0055   NITRITE NEGATIVE 04/16/2019 0055   LEUKOCYTESUR MODERATE (A) 04/16/2019 0055   Sepsis Labs: @LABRCNTIP (procalcitonin:4,lacticidven:4) ) Recent Results (from the past 240 hour(s))  Respiratory Panel by RT PCR (Flu A&B, Covid) - Nasopharyngeal Swab     Status: None   Collection Time: 04/15/19  4:44 PM   Specimen: Nasopharyngeal Swab  Result Value Ref Range Status   SARS Coronavirus 2 by RT PCR NEGATIVE NEGATIVE Final    Comment: (NOTE) SARS-CoV-2 target nucleic acids are NOT DETECTED. The SARS-CoV-2 RNA is generally detectable in upper respiratoy specimens during the acute phase of infection. The lowest concentration of SARS-CoV-2 viral copies this assay can detect is 131 copies/mL. A negative result does not preclude SARS-Cov-2 infection and should not be used as the sole basis for treatment or other patient management decisions. A negative result may occur with  improper specimen collection/handling, submission  of specimen other than nasopharyngeal swab, presence of viral mutation(s) within the areas targeted by this assay, and inadequate number of viral copies (<131 copies/mL). A negative result must be combined with clinical observations, patient history, and epidemiological information. The expected result is Negative. Fact Sheet for Patients:  PinkCheek.be Fact Sheet for Healthcare Providers:  GravelBags.it This test is not yet ap proved or cleared by the [place] FDA and  has been authorized for detection and/or diagnosis of SARS-CoV-2 by FDA under an Emergency Use Authorization (EUA). This EUA will remain  in effect (meaning this test can be used) for the duration of the COVID-19 declaration under Section 564(b)(1) of the Act, 21 U.S.C. section 360bbb-3(b)(1), unless the authorization is terminated or revoked sooner.    Influenza A by PCR NEGATIVE NEGATIVE Final   Influenza B by PCR NEGATIVE NEGATIVE Final    Comment: (NOTE) The Xpert Xpress SARS-CoV-2/FLU/RSV assay is intended as an aid in  the diagnosis of influenza from Nasopharyngeal swab specimens and  should not be used as a sole basis for treatment. Nasal washings and  aspirates are unacceptable for Xpert Xpress SARS-CoV-2/FLU/RSV  testing. Fact Sheet for Patients: PinkCheek.be Fact Sheet for Healthcare Providers: GravelBags.it This  test is not yet approved or cleared by the [place] and  has been authorized for detection and/or diagnosis of SARS-CoV-2 by  FDA under an Emergency Use Authorization (EUA). This EUA will remain  in effect (meaning this test can be used) for the duration of the  Covid-19 declaration under Section 564(b)(1) of the Act, 21  U.S.C. section 360bbb-3(b)(1), unless the authorization is  terminated or revoked. Performed at [facility], [address], [place], [place]  [address]   MRSA PCR Screening     Status: None   Collection Time: 04/15/19 10:22 PM   Specimen: Nasal Mucosa; Nasopharyngeal  Result Value Ref Range Status   MRSA by PCR NEGATIVE NEGATIVE Final    Comment:        The GeneXpert MRSA Assay (FDA approved for NASAL specimens only), is one component of a comprehensive MRSA colonization surveillance program. It is not intended to diagnose MRSA infection nor to guide or monitor treatment for MRSA infections. Performed at [facility], [address], [place], [place] [address]      Scheduled Meds: . apixaban  5 mg Oral BID  . aspirin EC  81 mg Oral Daily  . Chlorhexidine Gluconate Cloth  6 each Topical Daily  . diclofenac  75 mg Oral BID  . diltiazem  60 mg Oral Q6H  . pantoprazole  40 mg Oral Daily  . PARoxetine  20 mg Oral Daily  . pilocarpine  1 drop Both Eyes BID   Continuous Infusions: . sodium chloride 250 mL (04/15/19 2158)  . diltiazem (CARDIZEM) infusion 7.5 mg/hr (04/16/19 [id number])  . ferumoxytol      Procedures/Studies: DG Chest Port 1 View  Result Date: 04/15/2019 CLINICAL DATA:  Shortness of breath for 1 week. Nausea. Dizziness and weakness. EXAM: PORTABLE CHEST 1 VIEW COMPARISON:  03/31/2017 FINDINGS: The heart size and mediastinal contours are within normal limits. Both lungs are clear. Increased size of small to moderate hiatal hernia is seen in the right cardiophrenic angle. IMPRESSION: 1. No active cardiopulmonary disease. 2. Increased size of small to moderate hiatal hernia. Electronically Signed   By: Marlaine Hind M.D.   On: 04/15/2019 13:06    Orson Eva, DO  [facility] Pager [phone]  If 7PM-7AM, please contact night-coverage [email or web address] Password [email or web address] 04/16/2019, 7:12 AM   LOS: 0 days

## 2019-04-16 NOTE — Progress Notes (Signed)
Echocardiogram 2D Echocardiogram has been performed.  Oneal Deputy Cheng Dec 04/16/2019, 8:20 AM

## 2019-04-17 DIAGNOSIS — N390 Urinary tract infection, site not specified: Secondary | ICD-10-CM

## 2019-04-17 DIAGNOSIS — I517 Cardiomegaly: Secondary | ICD-10-CM

## 2019-04-17 LAB — CBC
HCT: 32.7 % — ABNORMAL LOW (ref 36.0–46.0)
Hemoglobin: 9.8 g/dL — ABNORMAL LOW (ref 12.0–15.0)
MCH: 27.5 pg (ref 26.0–34.0)
MCHC: 30 g/dL (ref 30.0–36.0)
MCV: 91.6 fL (ref 80.0–100.0)
Platelets: 292 10*3/uL (ref 150–400)
RBC: 3.57 MIL/uL — ABNORMAL LOW (ref 3.87–5.11)
RDW: 16.8 % — ABNORMAL HIGH (ref 11.5–15.5)
WBC: 9.7 10*3/uL (ref 4.0–10.5)
nRBC: 0 % (ref 0.0–0.2)

## 2019-04-17 LAB — BASIC METABOLIC PANEL
Anion gap: 12 (ref 5–15)
BUN: 22 mg/dL (ref 8–23)
CO2: 26 mmol/L (ref 22–32)
Calcium: 9.2 mg/dL (ref 8.9–10.3)
Chloride: 104 mmol/L (ref 98–111)
Creatinine, Ser: 0.79 mg/dL (ref 0.44–1.00)
GFR calc Af Amer: >60 mL/min (ref >60)
GFR calc non Af Amer: >60 mL/min (ref >60)
Glucose, Bld: 122 mg/dL — ABNORMAL HIGH (ref 70–99)
Potassium: 3.6 mmol/L (ref 3.5–5.1)
Sodium: 142 mmol/L (ref 135–145)

## 2019-04-17 LAB — MAGNESIUM: Magnesium: 2 mg/dL (ref 1.7–2.4)

## 2019-04-17 MED ORDER — FERROUS SULFATE 325 (65 FE) MG PO TABS: 325.0000 mg | ORAL_TABLET | Freq: Every day | ORAL | 3 refills | Status: DC

## 2019-04-17 MED ORDER — METOPROLOL TARTRATE 25 MG PO TABS
25.0000 mg | ORAL_TABLET | Freq: Two times a day (BID) | ORAL | Status: DC
  Administered 2019-04-17: 25 mg via ORAL
  Filled 2019-04-17: qty 1

## 2019-04-17 MED ORDER — APIXABAN 5 MG PO TABS: 5.0000 mg | ORAL_TABLET | Freq: Two times a day (BID) | ORAL | 1 refills | Status: DC

## 2019-04-17 MED ORDER — CEFDINIR 300 MG PO CAPS: 300.0000 mg | ORAL_CAPSULE | Freq: Two times a day (BID) | ORAL | 1 refills | Status: DC

## 2019-04-17 MED ORDER — CEFDINIR 300 MG PO CAPS
300.0000 mg | ORAL_CAPSULE | Freq: Two times a day (BID) | ORAL | Status: DC
  Administered 2019-04-17: 12:00:00 300 mg via ORAL
  Filled 2019-04-17: qty 1

## 2019-04-17 MED ORDER — DILTIAZEM HCL ER COATED BEADS 240 MG PO CP24
240.0000 mg | ORAL_CAPSULE | Freq: Every day | ORAL | Status: DC
  Administered 2019-04-17: 240 mg via ORAL
  Filled 2019-04-17: qty 1

## 2019-04-17 MED ORDER — METOPROLOL TARTRATE 25 MG PO TABS: 25.0000 mg | ORAL_TABLET | Freq: Two times a day (BID) | ORAL | 1 refills | Status: DC

## 2019-04-17 MED ORDER — DILTIAZEM HCL ER COATED BEADS 240 MG PO CP24: 240.0000 mg | ORAL_CAPSULE | Freq: Every day | ORAL | 1 refills | Status: DC

## 2019-04-17 NOTE — Discharge Summary (Signed)
Physician Discharge Summary  [name] [id number] DOB: 01-Apr-1940 DOA: 04/15/2019  PCP: [name], MD  Admit date: 04/15/2019 Discharge date: 04/17/2019  Admitted From: Home Disposition:  Home   Recommendations for Outpatient Follow-up:  1. Follow up with PCP in 1-2 weeks 2. Please obtain BMP/CBC in one week    Discharge Condition: Stable CODE STATUS: FULL Diet recommendation: Heart Healthy   Brief/Interim Summary: 79 year old female with a history of iron deficiency anemia, hyperlipidemia, anxiety, impaired glucose tolerance, chronic back pain presenting with 1 week history of intermittent shortness of breath, generalized weakness, and myalgias.  The patient has also had some subjective fevers and chills.  She went to obtain a " rapid Covid test" on 04/11/2018 which was negative.  She subsequently developed some lightheadedness and nausea and vomiting on 04/13/2018.  However, she states that she normally has intermittent nausea and vomiting which she attributes to her hiatal hernia and GERD.  In addition, the patient has describes some intermittent chest discomfort and dyspnea on exertion this past week.  She denies any coughing, hemoptysis, headache, visual disturbance, abdominal pain, diarrhea, hematochezia, melena, dysuria, hematuria.  She denies any Covid contacts.  She denies any new medications. In the emergency department, the patient was afebrile hemodynamically stable.  EKG showed atrial fibrillation with RVR with a heart rate to 150.  Oxygen saturation was 99% on room air.  BMP showed a potassium 3.3, but was otherwise unremarkable.  CBC showed WBC 10.5, hemoglobin 9.5, platelets 210,000.  Urinalysis > 50 WBC.  TSH 0.497.  EKG showed atrial fibrillation with nonspecific STT wave changes.   Discharge Diagnoses:  Atrial fibrillation with RVR, type unspecified -Transition to oral diltiazem today if possible -Echo--EF 55-60%, no WMA, mod RV enlargement -TSH  0.497 -CHADSVASc 3-4 (?HTN, age, female) -Started apixaban -Personally reviewed EKG--atrial fibrillation, nonspecific ST-T wave change -Personally reviewed chest x-ray--no edema or infiltrates -cardiology consulted-->added metoprolol, follow up outpt  Pyuria -Concern for UTI -Obtain urine culture--pending at time of d/c -home with cefdinir x 3 days  Hypokalemia -Replete -Check magnesium--2.0  GERD/hiatal hernia -Start Protonix -Patient states that this has resulted in intermittent nausea vomiting -Discussed the risks, benefits, and alternatives of chronic Voltaren use which may be exacerbating her nausea and vomiting--she has been on it for 4 years  Iron deficiency anemia -04/03/2019 iron saturation 7%, ferritin 5 -Feraheme x1 -start po ferrous sulfate  Depression -continue paxil  Nocturnal oxygen desaturation -desaturates 88-90% with sleep -will need outpatient sleep study   Discharge Instructions   Allergies as of 04/17/2019   No Known Allergies     Medication List    STOP taking these medications   aspirin EC 81 MG tablet   azithromycin 250 MG tablet Commonly known as: Zithromax   dexamethasone 4 MG tablet Commonly known as: DECADRON   dicyclomine 10 MG capsule Commonly known as: Bentyl     TAKE these medications   acetaminophen 500 MG tablet Commonly known as: TYLENOL Take 500 mg by mouth every 6 (six) hours as needed.   apixaban 5 MG Tabs tablet Commonly known as: ELIQUIS Take 1 tablet (5 mg total) by mouth 2 (two) times daily.   cefdinir 300 MG capsule Commonly known as: OMNICEF Take 1 capsule (300 mg total) by mouth every 12 (twelve) hours.   CHOLECALCIFEROL PO Take 5,000 Units by mouth daily.   cyanocobalamin 1000 MCG tablet Take 1,000 mcg by mouth daily.   cyclobenzaprine 10 MG tablet Commonly known as: FLEXERIL Take 1/2 to 1  tablet 2 to 3 x  /day if needed for Muscle Spasm   diclofenac 75 MG EC tablet Commonly known as:  VOLTAREN Take 1 tablet 2 x /day with Food for Pain & Inflammation What changed:   how much to take  how to take this  when to take this   diltiazem 240 MG 24 hr capsule Commonly known as: CARDIZEM CD Take 1 capsule (240 mg total) by mouth daily. Start taking on: April 18, 2019   ferrous sulfate 325 (65 FE) MG tablet Take 1 tablet (325 mg total) by mouth daily with breakfast. Start taking on: April 18, 2019   lansoprazole 30 MG capsule Commonly known as: PREVACID Take 1 capsule Daily for Indigestion & Reflux What changed:   how much to take  how to take this  when to take this   metoprolol tartrate 25 MG tablet Commonly known as: LOPRESSOR Take 1 tablet (25 mg total) by mouth 2 (two) times daily.   PARoxetine 20 MG tablet Commonly known as: PAXIL Take 1 tablet Daily for Mood What changed:   how much to take  how to take this  when to take this   pilocarpine 1 % ophthalmic solution Commonly known as: PILOCAR 1 drop 2 (two) times daily.   promethazine-dextromethorphan 6.25-15 MG/5ML syrup Commonly known as: PROMETHAZINE-DM Take 1 to 2 tsp enery 4 hours if needed for cough   Vitamin C 500 MG Caps Take by mouth.   ZINC PO Take 1 tablet by mouth daily.       No Known Allergies  Consultations:  cardiology   Procedures/Studies: DG Chest Port 1 View  Result Date: 04/15/2019 CLINICAL DATA:  Shortness of breath for 1 week. Nausea. Dizziness and weakness. EXAM: PORTABLE CHEST 1 VIEW COMPARISON:  03/31/2017 FINDINGS: The heart size and mediastinal contours are within normal limits. Both lungs are clear. Increased size of small to moderate hiatal hernia is seen in the right cardiophrenic angle. IMPRESSION: 1. No active cardiopulmonary disease. 2. Increased size of small to moderate hiatal hernia. Electronically Signed   By: Marlaine Hind M.D.   On: 04/15/2019 13:06   ECHOCARDIOGRAM COMPLETE  Result Date: 04/16/2019   ECHOCARDIOGRAM REPORT   Patient  Name:   Darlene [name] Date of Exam: 04/16/2019 Medical Rec #:  [id number]      Height:       70.0 in Accession #:    [id number]     Weight:       222.9 lb Date of Birth:  1940-07-18     BSA:          2.19 m Patient Age:    61 years       BP:           131/91 mmHg Patient Gender: F              HR:           94 bpm. Exam Location:  [facility] Procedure: 2D Echo, Color Doppler and Cardiac Doppler Indications:    I48.91* Unspecified atrial fibrillation  History:        Patient has no prior history of Echocardiogram examinations.                 Arrythmias:Atrial Fibrillation; Risk Factors:Dyslipidemia.  Sonographer:    Raquel Sarna Senior RDCS Referring Phys: [place]  1. Left ventricular ejection fraction, by visual estimation, is 55 to 60%. The left ventricle has normal function. There is mildly  increased left ventricular hypertrophy.  2. Left ventricular diastolic parameters are indeterminate.  3. The left ventricle has no regional wall motion abnormalities.  4. Global right ventricle has normal systolic function.The right ventricular size is moderately enlarged. No increase in right ventricular wall thickness.  5. Left atrial size was severely dilated.  6. Right atrial size was moderately dilated.  7. The mitral valve is normal in structure. Mild mitral valve regurgitation. No evidence of mitral stenosis.  8. The tricuspid valve is normal in structure.  9. The tricuspid valve is normal in structure. Tricuspid valve regurgitation is mild. 10. The aortic valve is tricuspid. Aortic valve regurgitation is not visualized. No evidence of aortic valve sclerosis or stenosis. 11. The pulmonic valve was not well visualized. Pulmonic valve regurgitation is not visualized. 12. Moderately elevated pulmonary artery systolic pressure. 13. The inferior vena cava is dilated in size with <50% respiratory variability, suggesting right atrial pressure of 15 mmHg. FINDINGS  Left Ventricle: Left ventricular ejection  fraction, by visual estimation, is 55 to 60%. The left ventricle has normal function. The left ventricle has no regional wall motion abnormalities. There is mildly increased left ventricular hypertrophy. Left ventricular diastolic parameters are indeterminate. Right Ventricle: The right ventricular size is moderately enlarged. No increase in right ventricular wall thickness. Global RV systolic function is has normal systolic function. The tricuspid regurgitant velocity is 2.54 m/s, and with an assumed right atrial pressure of 15 mmHg, the estimated right ventricular systolic pressure is moderately elevated at 40.8 mmHg. Left Atrium: Left atrial size was severely dilated. Right Atrium: Right atrial size was moderately dilated Pericardium: There is no evidence of pericardial effusion. Mitral Valve: The mitral valve is normal in structure. Mild mitral valve regurgitation. No evidence of mitral valve stenosis by observation. MR is eccentric and posterior, may be underestimated. Tricuspid Valve: The tricuspid valve is normal in structure. Tricuspid valve regurgitation is mild. Aortic Valve: The aortic valve is tricuspid. Aortic valve regurgitation is not visualized. The aortic valve is structurally normal, with no evidence of sclerosis or stenosis. Aortic valve mean gradient measures 3.6 mmHg. Aortic valve peak gradient measures 7.4 mmHg. Aortic valve area, by VTI measures 2.08 cm. Pulmonic Valve: The pulmonic valve was not well visualized. Pulmonic valve regurgitation is not visualized. Pulmonic regurgitation is not visualized. No evidence of pulmonic stenosis. Aorta: The aortic root is normal in size and structure. Venous: The inferior vena cava is dilated in size with less than 50% respiratory variability, suggesting right atrial pressure of 15 mmHg. IAS/Shunts: No atrial level shunt detected by color flow Doppler.  LEFT VENTRICLE PLAX 2D LVIDd:         5.19 cm LVIDs:         3.42 cm LV PW:         1.01 cm LV IVS:         0.98 cm LVOT diam:     2.00 cm LV SV:         81 ml LV SV Index:   35.69 LVOT Area:     3.14 cm  RIGHT VENTRICLE RV S prime:     8.70 cm/s TAPSE (M-mode): 2.0 cm LEFT ATRIUM              Index       RIGHT ATRIUM           Index LA diam:        4.60 cm  2.10 cm/m  RA Area:  21.80 cm LA Vol (A2C):   117.0 ml 53.53 ml/m RA Volume:   60.00 ml  27.45 ml/m LA Vol (A4C):   155.0 ml 70.92 ml/m LA Biplane Vol: 136.0 ml 62.22 ml/m  AORTIC VALVE AV Area (Vmax):    1.73 cm AV Area (Vmean):   1.85 cm AV Area (VTI):     2.08 cm AV Vmax:           135.74 cm/s AV Vmean:          88.128 cm/s AV VTI:            0.230 m AV Peak Grad:      7.4 mmHg AV Mean Grad:      3.6 mmHg LVOT Vmax:         74.53 cm/s LVOT Vmean:        51.833 cm/s LVOT VTI:          0.152 m LVOT/AV VTI ratio: 0.66  AORTA Ao Root diam: 2.40 cm Ao Asc diam:  3.30 cm TRICUSPID VALVE TR Peak grad:   25.8 mmHg TR Vmax:        254.00 cm/s  SHUNTS Systemic VTI:  0.15 m Systemic Diam: 2.00 cm  Carlyle Dolly MD Electronically signed by Carlyle Dolly MD Signature Date/Time: 04/16/2019/12:27:40 PM    Final         Discharge Exam: Vitals:   04/17/19 0800 04/17/19 1000  BP: 120/70 120/86  Pulse: 80 93  Resp: 19 20  Temp:    SpO2: 92% 96%   Vitals:   04/17/19 0500 04/17/19 0631 04/17/19 0800 04/17/19 1000  BP:  125/73 120/70 120/86  Pulse:   80 93  Resp:   19 20  Temp:      TempSrc:      SpO2:   92% 96%  Weight: 101.3 kg     Height:        General: Pt is alert, awake, not in acute distress Cardiovascular: IRRR, S1/S2 +, no rubs, no gallops Respiratory: CTA bilaterally, no wheezing, no rhonchi Abdominal: Soft, NT, ND, bowel sounds + Extremities: no edema, no cyanosis   The results of significant diagnostics from this hospitalization (including imaging, microbiology, ancillary and laboratory) are listed below for reference.    Significant Diagnostic Studies: DG Chest Port 1 View  Result Date: 04/15/2019 CLINICAL DATA:   Shortness of breath for 1 week. Nausea. Dizziness and weakness. EXAM: PORTABLE CHEST 1 VIEW COMPARISON:  03/31/2017 FINDINGS: The heart size and mediastinal contours are within normal limits. Both lungs are clear. Increased size of small to moderate hiatal hernia is seen in the right cardiophrenic angle. IMPRESSION: 1. No active cardiopulmonary disease. 2. Increased size of small to moderate hiatal hernia. Electronically Signed   By: Marlaine Hind M.D.   On: 04/15/2019 13:06   ECHOCARDIOGRAM COMPLETE  Result Date: 04/16/2019   ECHOCARDIOGRAM REPORT   Patient Name:   Darlene [name] Date of Exam: 04/16/2019 Medical Rec #:  [id number]      Height:       70.0 in Accession #:    [id number]     Weight:       222.9 lb Date of Birth:  12-01-1940     BSA:          2.19 m Patient Age:    31 years       BP:           131/91 mmHg Patient Gender: F  HR:           94 bpm. Exam Location:  [facility] Procedure: 2D Echo, Color Doppler and Cardiac Doppler Indications:    I48.91* Unspecified atrial fibrillation  History:        Patient has no prior history of Echocardiogram examinations.                 Arrythmias:Atrial Fibrillation; Risk Factors:Dyslipidemia.  Sonographer:    Raquel Sarna Senior RDCS Referring Phys: [place]  1. Left ventricular ejection fraction, by visual estimation, is 55 to 60%. The left ventricle has normal function. There is mildly increased left ventricular hypertrophy.  2. Left ventricular diastolic parameters are indeterminate.  3. The left ventricle has no regional wall motion abnormalities.  4. Global right ventricle has normal systolic function.The right ventricular size is moderately enlarged. No increase in right ventricular wall thickness.  5. Left atrial size was severely dilated.  6. Right atrial size was moderately dilated.  7. The mitral valve is normal in structure. Mild mitral valve regurgitation. No evidence of mitral stenosis.  8. The tricuspid valve is normal in  structure.  9. The tricuspid valve is normal in structure. Tricuspid valve regurgitation is mild. 10. The aortic valve is tricuspid. Aortic valve regurgitation is not visualized. No evidence of aortic valve sclerosis or stenosis. 11. The pulmonic valve was not well visualized. Pulmonic valve regurgitation is not visualized. 12. Moderately elevated pulmonary artery systolic pressure. 13. The inferior vena cava is dilated in size with <50% respiratory variability, suggesting right atrial pressure of 15 mmHg. FINDINGS  Left Ventricle: Left ventricular ejection fraction, by visual estimation, is 55 to 60%. The left ventricle has normal function. The left ventricle has no regional wall motion abnormalities. There is mildly increased left ventricular hypertrophy. Left ventricular diastolic parameters are indeterminate. Right Ventricle: The right ventricular size is moderately enlarged. No increase in right ventricular wall thickness. Global RV systolic function is has normal systolic function. The tricuspid regurgitant velocity is 2.54 m/s, and with an assumed right atrial pressure of 15 mmHg, the estimated right ventricular systolic pressure is moderately elevated at 40.8 mmHg. Left Atrium: Left atrial size was severely dilated. Right Atrium: Right atrial size was moderately dilated Pericardium: There is no evidence of pericardial effusion. Mitral Valve: The mitral valve is normal in structure. Mild mitral valve regurgitation. No evidence of mitral valve stenosis by observation. MR is eccentric and posterior, may be underestimated. Tricuspid Valve: The tricuspid valve is normal in structure. Tricuspid valve regurgitation is mild. Aortic Valve: The aortic valve is tricuspid. Aortic valve regurgitation is not visualized. The aortic valve is structurally normal, with no evidence of sclerosis or stenosis. Aortic valve mean gradient measures 3.6 mmHg. Aortic valve peak gradient measures 7.4 mmHg. Aortic valve area, by VTI  measures 2.08 cm. Pulmonic Valve: The pulmonic valve was not well visualized. Pulmonic valve regurgitation is not visualized. Pulmonic regurgitation is not visualized. No evidence of pulmonic stenosis. Aorta: The aortic root is normal in size and structure. Venous: The inferior vena cava is dilated in size with less than 50% respiratory variability, suggesting right atrial pressure of 15 mmHg. IAS/Shunts: No atrial level shunt detected by color flow Doppler.  LEFT VENTRICLE PLAX 2D LVIDd:         5.19 cm LVIDs:         3.42 cm LV PW:         1.01 cm LV IVS:  0.98 cm LVOT diam:     2.00 cm LV SV:         81 ml LV SV Index:   35.69 LVOT Area:     3.14 cm  RIGHT VENTRICLE RV S prime:     8.70 cm/s TAPSE (M-mode): 2.0 cm LEFT ATRIUM              Index       RIGHT ATRIUM           Index LA diam:        4.60 cm  2.10 cm/m  RA Area:     21.80 cm LA Vol (A2C):   117.0 ml 53.53 ml/m RA Volume:   60.00 ml  27.45 ml/m LA Vol (A4C):   155.0 ml 70.92 ml/m LA Biplane Vol: 136.0 ml 62.22 ml/m  AORTIC VALVE AV Area (Vmax):    1.73 cm AV Area (Vmean):   1.85 cm AV Area (VTI):     2.08 cm AV Vmax:           135.74 cm/s AV Vmean:          88.128 cm/s AV VTI:            0.230 m AV Peak Grad:      7.4 mmHg AV Mean Grad:      3.6 mmHg LVOT Vmax:         74.53 cm/s LVOT Vmean:        51.833 cm/s LVOT VTI:          0.152 m LVOT/AV VTI ratio: 0.66  AORTA Ao Root diam: 2.40 cm Ao Asc diam:  3.30 cm TRICUSPID VALVE TR Peak grad:   25.8 mmHg TR Vmax:        254.00 cm/s  SHUNTS Systemic VTI:  0.15 m Systemic Diam: 2.00 cm  Carlyle Dolly MD Electronically signed by Carlyle Dolly MD Signature Date/Time: 04/16/2019/12:27:40 PM    Final      Microbiology: Recent Results (from the past 240 hour(s))  Respiratory Panel by RT PCR (Flu A&B, Covid) - Nasopharyngeal Swab     Status: None   Collection Time: 04/15/19  4:44 PM   Specimen: Nasopharyngeal Swab  Result Value Ref Range Status   SARS Coronavirus 2 by RT PCR NEGATIVE  NEGATIVE Final    Comment: (NOTE) SARS-CoV-2 target nucleic acids are NOT DETECTED. The SARS-CoV-2 RNA is generally detectable in upper respiratoy specimens during the acute phase of infection. The lowest concentration of SARS-CoV-2 viral copies this assay can detect is 131 copies/mL. A negative result does not preclude SARS-Cov-2 infection and should not be used as the sole basis for treatment or other patient management decisions. A negative result may occur with  improper specimen collection/handling, submission of specimen other than nasopharyngeal swab, presence of viral mutation(s) within the areas targeted by this assay, and inadequate number of viral copies (<131 copies/mL). A negative result must be combined with clinical observations, patient history, and epidemiological information. The expected result is Negative. Fact Sheet for Patients:  PinkCheek.be Fact Sheet for Healthcare Providers:  GravelBags.it This test is not yet ap proved or cleared by the [place] FDA and  has been authorized for detection and/or diagnosis of SARS-CoV-2 by FDA under an Emergency Use Authorization (EUA). This EUA will remain  in effect (meaning this test can be used) for the duration of the COVID-19 declaration under Section 564(b)(1) of the Act, 21 U.S.C. section 360bbb-3(b)(1), unless the authorization is terminated or  revoked sooner.    Influenza A by PCR NEGATIVE NEGATIVE Final   Influenza B by PCR NEGATIVE NEGATIVE Final    Comment: (NOTE) The Xpert Xpress SARS-CoV-2/FLU/RSV assay is intended as an aid in  the diagnosis of influenza from Nasopharyngeal swab specimens and  should not be used as a sole basis for treatment. Nasal washings and  aspirates are unacceptable for Xpert Xpress SARS-CoV-2/FLU/RSV  testing. Fact Sheet for Patients: PinkCheek.be Fact Sheet for Healthcare  Providers: GravelBags.it This test is not yet approved or cleared by the [place] FDA and  has been authorized for detection and/or diagnosis of SARS-CoV-2 by  FDA under an Emergency Use Authorization (EUA). This EUA will remain  in effect (meaning this test can be used) for the duration of the  Covid-19 declaration under Section 564(b)(1) of the Act, 21  U.S.C. section 360bbb-3(b)(1), unless the authorization is  terminated or revoked. Performed at [facility], [address], [place], [place] [address]   MRSA PCR Screening     Status: None   Collection Time: 04/15/19 10:22 PM   Specimen: Nasal Mucosa; Nasopharyngeal  Result Value Ref Range Status   MRSA by PCR NEGATIVE NEGATIVE Final    Comment:        The GeneXpert MRSA Assay (FDA approved for NASAL specimens only), is one component of a comprehensive MRSA colonization surveillance program. It is not intended to diagnose MRSA infection nor to guide or monitor treatment for MRSA infections. Performed at [facility] [address], [place], [place] [address]      Labs: Basic Metabolic Panel: Recent Labs  Lab 04/15/19 1216 04/15/19 1216 04/16/19 0435 04/17/19 0404  NA 137  --  140 142  K 3.3*   < > 3.7 3.6  CL 102  --  103 104  CO2 26  --  26 26  GLUCOSE 162*  --  159* 122*  BUN 20  --  19 22  CREATININE 0.89  --  0.69 0.79  CALCIUM 8.7*  --  9.0 9.2  MG  --   --  2.0 2.0   < > = values in this interval not displayed.   Liver Function Tests: Recent Labs  Lab 04/15/19 1216  AST 25  ALT 28  ALKPHOS 77  BILITOT 0.5  PROT 6.3*  ALBUMIN 2.8*   Recent Labs  Lab 04/15/19 1216  LIPASE 15   No results for input(s): AMMONIA in the last 168 hours. CBC: Recent Labs  Lab 04/15/19 1216 04/17/19 0404  WBC 10.5 9.7  NEUTROABS 9.2*  --   HGB 9.5* 9.8*  HCT 30.7* 32.7*  MCV 90.8 91.6  PLT 210 292   Cardiac Enzymes: No results for input(s): CKTOTAL, CKMB, CKMBINDEX,  TROPONINI in the last 168 hours. BNP: Invalid input(s): POCBNP CBG: No results for input(s): GLUCAP in the last 168 hours.  Time coordinating discharge:  36 minutes  Signed:  Orson Eva, DO [facility] Pager: [phone] 04/17/2019, 11:14 AM

## 2019-04-17 NOTE — Consult Note (Addendum)
Cardiology Consultation:   Patient ID: Darlene [name] MRN: [id number]; DOB: 06/24/1940  Admit date: 04/15/2019 Date of Consult: 04/17/2019  Primary Care Provider: [name], MD Primary Cardiologist: Kate Sable, MD   Primary Electrophysiologist:  None    Patient Profile:   Darlene [name] is a 79 y.o. female with a hx of GERD, HLD who is being seen today for the evaluation of Atrial fibrillation with RVR at the request of Dr. Carles Collet.  History of Present Illness:   Darlene [name]  With history of HLD, anxiety, chronic back pain admitted with one week history of weakness, chills, body aches, lightheadeness, n/v. Had some dyspnea on exertion. Covid 19 negative and found to be in Afib with RVR. IV diltiazem switched to oral and rate better controlled but still in the 90's to low 100's also on eliquis. Chadsvasc = 3-4, age, female and prediabetes. Mother died 30's CVA, father died in 24's MI. Denies chest pain, palpitations, edema. Echo yesterday normal LVEF 55-60%, mild LVH, RV mod enlargement, LA severely dilated-4.6 cm, RV mod dilated, mod elevation PASP.  Heart Pathway Score:     Past Medical History:  Diagnosis Date  . Anxiety   . Chronic lower back pain   . Depression   . DJD (degenerative joint disease)    knees, neck  . GERD (gastroesophageal reflux disease)    has had dilitation in the past    Past Surgical History:  Procedure Laterality Date  . ABDOMINAL HYSTERECTOMY  03/1979  . BREAST BIOPSY Left 1986 and 1987  . CATARACT EXTRACTION, BILATERAL Bilateral 2014   Dr. Madelin Headings, in [place] Medications:  Prior to Admission medications   Medication Sig Start Date End Date Taking? Authorizing Provider  acetaminophen (TYLENOL) 500 MG tablet Take 500 mg by mouth every 6 (six) hours as needed.   Yes [provider]  Ascorbic Acid (VITAMIN C) 500 MG CAPS Take by mouth.   Yes [provider]  aspirin EC 81 MG tablet Take 81 mg by mouth daily.    Yes [provider]  azithromycin (ZITHROMAX) 250 MG tablet Take 2 tablets with Food on  Day 1, then 1 tablet Daily with Food for Infection Patient taking differently: Take 250 mg by mouth daily. Take 2 tablets with Food on  Day 1, then 1 tablet Daily with Food for Infection 04/14/19  Yes [name], MD  CHOLECALCIFEROL PO Take 5,000 Units by mouth daily.   Yes [provider]  cyanocobalamin 1000 MCG tablet Take 1,000 mcg by mouth daily.   Yes [provider]  cyclobenzaprine (FLEXERIL) 10 MG tablet Take 1/2 to 1 tablet 2 to 3 x  /day if needed for Muscle Spasm 11/23/18  Yes [name], MD  dexamethasone (DECADRON) 4 MG tablet Take 1 tab 3 x day for  2 days, then 2 x day for  2 days, then 1 tab daily 04/14/19  Yes [name], MD  diclofenac (VOLTAREN) 75 MG EC tablet Take 1 tablet 2 x /day with Food for Pain & Inflammation Patient taking differently: Take 75 mg by mouth 2 (two) times daily. Take 1 tablet 2 x /day with Food for Pain & Inflammation 01/05/19  Yes [name], MD  lansoprazole (PREVACID) 30 MG capsule Take 1 capsule Daily for Indigestion & Reflux Patient taking differently: Take 30 mg by mouth daily at 12 noon. Take 1 capsule Daily for Indigestion & Reflux 09/06/18  Yes [name], MD  Multiple  Vitamins-Minerals (ZINC PO) Take 1 tablet by mouth daily.   Yes [provider]  PARoxetine (PAXIL) 20 MG tablet Take 1 tablet Daily for Mood Patient taking differently: Take 20 mg by mouth daily. Take 1 tablet Daily for Mood 11/23/18  Yes [name], MD  pilocarpine [facility]) 1 % ophthalmic solution 1 drop 2 (two) times daily.   Yes [provider]  promethazine-dextromethorphan (PROMETHAZINE-DM) 6.25-15 MG/5ML syrup Take 1 to 2 tsp enery 4 hours if needed for cough 04/14/19  Yes [name], MD  dicyclomine (BENTYL) 10 MG capsule Take 1 tab three times a day by mouth with meals as needed for abdominal  cramping. Patient not taking: Reported on 04/15/2019 11/28/18   [name], NP    Inpatient Medications: Scheduled Meds: . apixaban  5 mg Oral BID  . aspirin EC  81 mg Oral Daily  . Chlorhexidine Gluconate Cloth  6 each Topical Daily  . diclofenac  75 mg Oral BID  . diltiazem  240 mg Oral Daily  . ferrous sulfate  325 mg Oral Q breakfast  . pantoprazole  40 mg Oral Daily  . PARoxetine  20 mg Oral Daily  . pilocarpine  1 drop Both Eyes BID  . sodium chloride flush  3 mL Intravenous Q12H   Continuous Infusions: . sodium chloride 250 mL (04/15/19 2158)  . cefTRIAXone (ROCEPHIN)  IV 1 g (04/16/19 1407)   PRN Meds: sodium chloride, acetaminophen, [DISCONTINUED] promethazine **AND** dextromethorphan, ondansetron (ZOFRAN) IV  Allergies:   No Known Allergies  Social History:   Social History   Socioeconomic History  . Marital status: Widowed    Spouse name: Not on file  . Number of children: 2  . Years of education: Not on file  . Highest education level: Not on file  Occupational History  . Occupation: retired    Comment: worked in Academic librarian  . Smoking status: Never Smoker  . Smokeless tobacco: Never Used  Substance and Sexual Activity  . Alcohol use: Yes    Alcohol/week: 3.0 standard drinks    Types: 3 Standard drinks or equivalent per week    Comment: 3 nights out of the week  . Drug use: No  . Sexual activity: Not Currently  Other Topics Concern  . Not on file  Social History Narrative  . Not on file   Social Determinants of Health   Financial Resource Strain:   . Difficulty of Paying Living Expenses: Not on file  Food Insecurity:   . Worried About Charity fundraiser in the Last Year: Not on file  . Ran Out of Food in the Last Year: Not on file  Transportation Needs:   . Lack of Transportation (Medical): Not on file  . Lack of Transportation (Non-Medical): Not on file  Physical Activity:   . Days of Exercise per Week: Not on file  . Minutes  of Exercise per Session: Not on file  Stress:   . Feeling of Stress : Not on file  Social Connections:   . Frequency of Communication with Friends and Family: Not on file  . Frequency of Social Gatherings with Friends and Family: Not on file  . Attends Religious Services: Not on file  . Active Member of Clubs or Organizations: Not on file  . Attends Archivist Meetings: Not on file  . Marital Status: Not on file  Intimate Partner Violence:   . Fear of Current or Ex-Partner: Not on file  . Emotionally  Abused: Not on file  . Physically Abused: Not on file  . Sexually Abused: Not on file    Family History:     Family History  Problem Relation Age of Onset  . Hypertension Mother   . Heart disease Mother   . Hypertension Father   . Heart disease Father   . Tremor Sister   . Dementia Brother   . Hypertension Daughter   . Colon cancer Neg Hx   . Esophageal cancer Neg Hx   . Rectal cancer Neg Hx   . Stomach cancer Neg Hx      ROS:  Please see the history of present illness.  Review of Systems  Constitution: Negative.  HENT: Negative.   Eyes: Negative.   Cardiovascular: Negative.   Respiratory: Negative.   Hematologic/Lymphatic: Negative.   Musculoskeletal: Positive for myalgias and stiffness. Negative for joint pain.  Gastrointestinal: Negative.   Genitourinary: Negative.   Neurological: Positive for light-headedness and weakness.   All other ROS reviewed and negative.     Physical Exam/Data:   Vitals:   04/17/19 0300 04/17/19 0400 04/17/19 0500 04/17/19 0631  BP:    125/73  Pulse: 92     Resp:      Temp:  98.1 F (36.7 C)    TempSrc:  Axillary    SpO2: 96%     Weight:   101.3 kg   Height:        Intake/Output Summary (Last 24 hours) at 04/17/2019 0858 Last data filed at 04/17/2019 [id number] Gross per 24 hour  Intake 1101.55 ml  Output 2280 ml  Net -1178.45 ml   Last 3 Weights 04/17/2019 04/15/2019 04/03/2019  Weight (lbs) 223 lb 5.2 oz 222 lb 14.2 oz 225  lb 12.8 oz  Weight (kg) 101.3 kg 101.1 kg 102.422 kg     Body mass index is 32.04 kg/m.  General:  Well nourished, well developed, in no acute distress HEENT: normal Lymph: no adenopathy Neck: no JVD Endocrine:  No thryomegaly Vascular: No carotid bruits; FA pulses 2+ bilaterally without bruits  Cardiac:  normal S1, S2; RRR; no murmur   Lungs:  clear to auscultation bilaterally, no wheezing, rhonchi or rales  Abd: soft, nontender, no hepatomegaly  Ext: no edema Musculoskeletal:  No deformities, BUE and BLE strength normal and equal Skin: warm and dry  Neuro:  CNs 2-12 intact, no focal abnormalities noted Psych:  Normal affect   EKG:  The EKG was personally reviewed and demonstrates:  Afib 152/m with poor R wave progression and ST T wave changes. Telemetry:  Telemetry was personally reviewed and demonstrates:  Afib 90-100's  Relevant CV Studies: Echo 04/16/19 IMPRESSIONS     1. Left ventricular ejection fraction, by visual estimation, is 55 to  60%. The left ventricle has normal function. There is mildly increased  left ventricular hypertrophy.   2. Left ventricular diastolic parameters are indeterminate.   3. The left ventricle has no regional wall motion abnormalities.   4. Global right ventricle has normal systolic function.The right  ventricular size is moderately enlarged. No increase in right ventricular  wall thickness.   5. Left atrial size was severely dilated.   6. Right atrial size was moderately dilated.   7. The mitral valve is normal in structure. Mild mitral valve  regurgitation. No evidence of mitral stenosis.   8. The tricuspid valve is normal in structure.   9. The tricuspid valve is normal in structure. Tricuspid valve  regurgitation is mild.  10.  The aortic valve is tricuspid. Aortic valve regurgitation is not  visualized. No evidence of aortic valve sclerosis or stenosis.  11. The pulmonic valve was not well visualized. Pulmonic valve  regurgitation is  not visualized.  12. Moderately elevated pulmonary artery systolic pressure.  13. The inferior vena cava is dilated in size with <50% respiratory  variability, suggesting right atrial pressure of 15 mmHg.    Laboratory Data:  High Sensitivity Troponin:   Recent Labs  Lab 04/15/19 1216  TROPONINIHS 14     Chemistry Recent Labs  Lab 04/15/19 1216 04/16/19 0435 04/17/19 0404  NA 137 140 142  K 3.3* 3.7 3.6  CL 102 103 104  CO2 26 26 26   GLUCOSE 162* 159* 122*  BUN 20 19 22   CREATININE 0.89 0.69 0.79  CALCIUM 8.7* 9.0 9.2  GFRNONAA >60 >60 >60  GFRAA >60 >60 >60  ANIONGAP 9 11 12     Recent Labs  Lab 04/15/19 1216  PROT 6.3*  ALBUMIN 2.8*  AST 25  ALT 28  ALKPHOS 77  BILITOT 0.5   Hematology Recent Labs  Lab 04/15/19 1216 04/17/19 0404  WBC 10.5 9.7  RBC 3.38* 3.57*  HGB 9.5* 9.8*  HCT 30.7* 32.7*  MCV 90.8 91.6  MCH 28.1 27.5  MCHC 30.9 30.0  RDW 16.5* 16.8*  PLT 210 292   BNPNo results for input(s): BNP, PROBNP in the last 168 hours.  DDimer No results for input(s): DDIMER in the last 168 hours.   Radiology/Studies:  DG Chest Port 1 View  Result Date: 04/15/2019 CLINICAL DATA:  Shortness of breath for 1 week. Nausea. Dizziness and weakness. EXAM: PORTABLE CHEST 1 VIEW COMPARISON:  03/31/2017 FINDINGS: The heart size and mediastinal contours are within normal limits. Both lungs are clear. Increased size of small to moderate hiatal hernia is seen in the right cardiophrenic angle. IMPRESSION: 1. No active cardiopulmonary disease. 2. Increased size of small to moderate hiatal hernia. Electronically Signed   By: Marlaine Hind M.D.   On: 04/15/2019 13:06   ECHOCARDIOGRAM COMPLETE  Result Date: 04/16/2019   ECHOCARDIOGRAM REPORT   Patient Name:   STACE NEAS Date of Exam: 04/16/2019 Medical Rec #:  [id number]      Height:       70.0 in Accession #:    [id number]     Weight:       222.9 lb Date of Birth:  1940-06-02     BSA:          2.19 m Patient Age:    32  years       BP:           131/91 mmHg Patient Gender: F              HR:           94 bpm. Exam Location:  [facility] Procedure: 2D Echo, Color Doppler and Cardiac Doppler Indications:    I48.91* Unspecified atrial fibrillation  History:        Patient has no prior history of Echocardiogram examinations.                 Arrythmias:Atrial Fibrillation; Risk Factors:Dyslipidemia.  Sonographer:    Raquel Sarna Senior RDCS Referring Phys: [place]  1. Left ventricular ejection fraction, by visual estimation, is 55 to 60%. The left ventricle has normal function. There is mildly increased left ventricular hypertrophy.  2. Left ventricular diastolic parameters are indeterminate.  3. The left  ventricle has no regional wall motion abnormalities.  4. Global right ventricle has normal systolic function.The right ventricular size is moderately enlarged. No increase in right ventricular wall thickness.  5. Left atrial size was severely dilated.  6. Right atrial size was moderately dilated.  7. The mitral valve is normal in structure. Mild mitral valve regurgitation. No evidence of mitral stenosis.  8. The tricuspid valve is normal in structure.  9. The tricuspid valve is normal in structure. Tricuspid valve regurgitation is mild. 10. The aortic valve is tricuspid. Aortic valve regurgitation is not visualized. No evidence of aortic valve sclerosis or stenosis. 11. The pulmonic valve was not well visualized. Pulmonic valve regurgitation is not visualized. 12. Moderately elevated pulmonary artery systolic pressure. 13. The inferior vena cava is dilated in size with <50% respiratory variability, suggesting right atrial pressure of 15 mmHg. FINDINGS  Left Ventricle: Left ventricular ejection fraction, by visual estimation, is 55 to 60%. The left ventricle has normal function. The left ventricle has no regional wall motion abnormalities. There is mildly increased left ventricular hypertrophy. Left ventricular  diastolic parameters are indeterminate. Right Ventricle: The right ventricular size is moderately enlarged. No increase in right ventricular wall thickness. Global RV systolic function is has normal systolic function. The tricuspid regurgitant velocity is 2.54 m/s, and with an assumed right atrial pressure of 15 mmHg, the estimated right ventricular systolic pressure is moderately elevated at 40.8 mmHg. Left Atrium: Left atrial size was severely dilated. Right Atrium: Right atrial size was moderately dilated Pericardium: There is no evidence of pericardial effusion. Mitral Valve: The mitral valve is normal in structure. Mild mitral valve regurgitation. No evidence of mitral valve stenosis by observation. MR is eccentric and posterior, may be underestimated. Tricuspid Valve: The tricuspid valve is normal in structure. Tricuspid valve regurgitation is mild. Aortic Valve: The aortic valve is tricuspid. Aortic valve regurgitation is not visualized. The aortic valve is structurally normal, with no evidence of sclerosis or stenosis. Aortic valve mean gradient measures 3.6 mmHg. Aortic valve peak gradient measures 7.4 mmHg. Aortic valve area, by VTI measures 2.08 cm. Pulmonic Valve: The pulmonic valve was not well visualized. Pulmonic valve regurgitation is not visualized. Pulmonic regurgitation is not visualized. No evidence of pulmonic stenosis. Aorta: The aortic root is normal in size and structure. Venous: The inferior vena cava is dilated in size with less than 50% respiratory variability, suggesting right atrial pressure of 15 mmHg. IAS/Shunts: No atrial level shunt detected by color flow Doppler.  LEFT VENTRICLE PLAX 2D LVIDd:         5.19 cm LVIDs:         3.42 cm LV PW:         1.01 cm LV IVS:        0.98 cm LVOT diam:     2.00 cm LV SV:         81 ml LV SV Index:   35.69 LVOT Area:     3.14 cm  RIGHT VENTRICLE RV S prime:     8.70 cm/s TAPSE (M-mode): 2.0 cm LEFT ATRIUM              Index       RIGHT ATRIUM            Index LA diam:        4.60 cm  2.10 cm/m  RA Area:     21.80 cm LA Vol (A2C):   117.0 ml 53.53 ml/m RA Volume:   60.00  ml  27.45 ml/m LA Vol (A4C):   155.0 ml 70.92 ml/m LA Biplane Vol: 136.0 ml 62.22 ml/m  AORTIC VALVE AV Area (Vmax):    1.73 cm AV Area (Vmean):   1.85 cm AV Area (VTI):     2.08 cm AV Vmax:           135.74 cm/s AV Vmean:          88.128 cm/s AV VTI:            0.230 m AV Peak Grad:      7.4 mmHg AV Mean Grad:      3.6 mmHg LVOT Vmax:         74.53 cm/s LVOT Vmean:        51.833 cm/s LVOT VTI:          0.152 m LVOT/AV VTI ratio: 0.66  AORTA Ao Root diam: 2.40 cm Ao Asc diam:  3.30 cm TRICUSPID VALVE TR Peak grad:   25.8 mmHg TR Vmax:        254.00 cm/s  SHUNTS Systemic VTI:  0.15 m Systemic Diam: 2.00 cm  Carlyle Dolly MD Electronically signed by Carlyle Dolly MD Signature Date/Time: 04/16/2019/12:27:40 PM    Final          Assessment and Plan:   1. Atrial fibrillation with RVR now better controlled on po diltiazem 240 mg but still in low 100's at rest. CHADSVASC=3 age,female. Prediabetes A1C5.6 on eliquis. Echo with normal LV function, severely dilated LA, RV mod enlargement with elevated PASP. No CHF on exam. Patient doesn't feel heart racing and unlikely to hold NSR with severely dilated LA. For now would aim for rate control and eliquis and f/u as an outpatient. Can add low dose beta blocker. 2. HLD 3. GERD 4. Obesity 5. Possible UTI on antibiotics      For questions or updates, please contact [facility] Please consult [email or web address] for contact info under     Signed, Ermalinda Barrios, PA-C  04/17/2019 8:58 AM   The patient was seen and examined, and I agree with the history, physical exam, assessment and plan as documented above, with modifications as noted below. I have also personally reviewed all relevant documentation, old records, labs, and both radiographic and cardiovascular studies. I have also independently interpreted old and new  ECG's.  Briefly, this is a 79 year old woman with a history of hyperlipidemia gastroesophageal reflux disease who presented to the hospital with a 1 week history of generalized weakness, diffuse myalgias, lightheadedness, nausea and vomiting, and chills.  She had some exertional fatigue but denies chest pain and palpitations.  She was found to have a UTI and has been treated with IV ceftriaxone.  She was found to be in rapid atrial fibrillation and started on IV diltiazem which has since been switched to long-acting diltiazem.  Heart rate currently in the 90 bpm range.  She had been taking an aspirin for prevention and was started on apixaban 5 mg twice daily for systemic anticoagulation. TSH is normal.  She is anemic and has a history of chronic iron deficiency anemia.  I personally reviewed the echocardiogram which demonstrated normal LV systolic function with moderate right ventricular enlargement with normal right ventricular systolic function.  There was severe left atrial dilatation.  There was moderate elevation of pulmonary artery pressures.  She denies a history of COPD and pulmonary emboli.  There is no family history of lung disease.  Her mother died of a CVA in her 70s  and her father died of coronary artery disease in his 55s.  He was a smoker.  She only smoked for a short while in her early 69s.  Recommendations: I will discontinue aspirin as she requires apixaban.  For the time being I think it is okay to start Lopressor 25 mg twice daily but hopefully in the outpatient setting the dose of long-acting diltiazem can be increased and beta-blocker can be discontinued in order to consolidate medical therapy. Given her severe left atrial enlargement, she is unlikely to maintain sinus rhythm for any significant period of time.  Therefore, I will adopt a strategy of rate control and anticoagulation. As per the patient, she will be going home today. I plan to pursue outpatient work-up of  pulmonary hypertension.  Kate Sable, MD, [facility]  04/17/2019 10:08 AM

## 2019-04-17 NOTE — Progress Notes (Signed)
Patient oxygen saturation dropping to low 80s during sleep.  Oxygen placed at 2L/min via [place].

## 2019-04-17 NOTE — Discharge Instructions (Signed)

## 2019-04-18 ENCOUNTER — Telehealth: Payer: Self-pay | Admitting: *Deleted

## 2019-04-18 ENCOUNTER — Other Ambulatory Visit: Payer: Self-pay | Admitting: Internal Medicine

## 2019-04-18 LAB — URINE CULTURE: Culture: >=100000 — AB

## 2019-04-18 MED ORDER — CIPROFLOXACIN HCL 250 MG PO TABS: ORAL_TABLET | ORAL | 0 refills | Status: DC

## 2019-04-18 NOTE — Telephone Encounter (Signed)
Called patient on 04/18/2019 , 10:57 AM in an attempt to reach the patient for a hospital follow up.   Admit date: 04/15/19 Discharge: 04/17/19   She does not have any questions or concerns about medications from the hospital admission. The patient's medications were reviewed over the phone, they were counseled to bring in all current medications to the hospital follow up visit. Spoke with the patient's daughter.  She states the patient is still weak.  I advised the patient to call if any questions or concerns arise about the hospital admission or medications    Home health was not started in the hospital.  All questions were answered and a follow up appointment was made. The patient had a [place] appointment for 04/21/2019, but was cancelled and rescheduled for a hospital follow up appointment on 02/08/221 with Dr Melford Aase.  Prior to Admission medications   Medication Sig Start Date End Date Taking? Authorizing Provider  acetaminophen (TYLENOL) 500 MG tablet Take 500 mg by mouth every 6 (six) hours as needed.    [provider]  apixaban (ELIQUIS) 5 MG TABS tablet Take 1 tablet (5 mg total) by mouth 2 (two) times daily. 04/17/19   Orson Eva, MD  Ascorbic Acid (VITAMIN C) 500 MG CAPS Take by mouth.    [provider]  cefdinir (OMNICEF) 300 MG capsule Take 1 capsule (300 mg total) by mouth every 12 (twelve) hours. 04/17/19   Orson Eva, MD  CHOLECALCIFEROL PO Take 5,000 Units by mouth daily.    [provider]  cyanocobalamin 1000 MCG tablet Take 1,000 mcg by mouth daily.    [provider]  cyclobenzaprine (FLEXERIL) 10 MG tablet Take 1/2 to 1 tablet 2 to 3 x  /day if needed for Muscle Spasm 11/23/18   [name], MD  diclofenac (VOLTAREN) 75 MG EC tablet Take 1 tablet 2 x /day with Food for Pain & Inflammation Patient taking differently: Take 75 mg by mouth 2 (two) times daily. Take 1 tablet 2 x /day with Food for Pain & Inflammation 01/05/19   [name], MD   diltiazem (CARDIZEM CD) 240 MG 24 hr capsule Take 1 capsule (240 mg total) by mouth daily. 04/18/19   Orson Eva, MD  ferrous sulfate 325 (65 FE) MG tablet Take 1 tablet (325 mg total) by mouth daily with breakfast. 04/18/19   Tat, Shanon Brow, MD  lansoprazole (PREVACID) 30 MG capsule Take 1 capsule Daily for Indigestion & Reflux Patient taking differently: Take 30 mg by mouth daily at 12 noon. Take 1 capsule Daily for Indigestion & Reflux 09/06/18   [name], MD  metoprolol tartrate (LOPRESSOR) 25 MG tablet Take 1 tablet (25 mg total) by mouth 2 (two) times daily. 04/17/19   Orson Eva, MD  Multiple Vitamins-Minerals (ZINC PO) Take 1 tablet by mouth daily.    [provider]  PARoxetine (PAXIL) 20 MG tablet Take 1 tablet Daily for Mood Patient taking differently: Take 20 mg by mouth daily. Take 1 tablet Daily for Mood 11/23/18   [name], MD  pilocarpine [facility]) 1 % ophthalmic solution 1 drop 2 (two) times daily.    [provider]  promethazine-dextromethorphan (PROMETHAZINE-DM) 6.25-15 MG/5ML syrup Take 1 to 2 tsp enery 4 hours if needed for cough 04/14/19   [name], MD

## 2019-04-20 ENCOUNTER — Other Ambulatory Visit: Payer: Self-pay | Admitting: *Deleted

## 2019-04-20 ENCOUNTER — Encounter: Payer: Self-pay | Admitting: *Deleted

## 2019-04-20 NOTE — Patient Outreach (Signed)
Outreach call for [employer] on [employer].  Spoke with daughter, Darlene [name], pts next of kin. Darlene [name] lives with her daughter. She reports her mother is improving but this has been a big set back as her mother has always been really healthy. She says she is struggling with weakness, having some SOB. Getting used to new medications (6).   Darlene [name] has a follow up with her primary, Dr. Melford Aase on Monday and with cardiology next Friday.  She has all her medications. Patient was recently discharged from hospital and all medications have been reviewed. Outpatient Encounter Medications as of 04/20/2019  Medication Sig Note  . acetaminophen (TYLENOL) 500 MG tablet Take 500 mg by mouth every 6 (six) hours as needed.   Marland Kitchen apixaban (ELIQUIS) 5 MG TABS tablet Take 1 tablet (5 mg total) by mouth 2 (two) times daily.   . Ascorbic Acid (VITAMIN C) 500 MG CAPS Take by mouth.   . cefdinir (OMNICEF) 300 MG capsule Take 1 capsule (300 mg total) by mouth every 12 (twelve) hours.   . CHOLECALCIFEROL PO Take 5,000 Units by mouth daily. 03/31/2018: 7,000units  . ciprofloxacin (CIPRO) 250 MG tablet Take 1 tablet 2 x /day with Food for Infection   . cyanocobalamin 1000 MCG tablet Take 1,000 mcg by mouth daily.   . cyclobenzaprine (FLEXERIL) 10 MG tablet Take 1/2 to 1 tablet 2 to 3 x  /day if needed for Muscle Spasm   . diclofenac (VOLTAREN) 75 MG EC tablet Take 1 tablet 2 x /day with Food for Pain & Inflammation (Patient taking differently: Take 75 mg by mouth 2 (two) times daily. Take 1 tablet 2 x /day with Food for Pain & Inflammation)   . diltiazem (CARDIZEM CD) 240 MG 24 hr capsule Take 1 capsule (240 mg total) by mouth daily.   . ferrous sulfate 325 (65 FE) MG tablet Take 1 tablet (325 mg total) by mouth daily with breakfast.   . lansoprazole (PREVACID) 30 MG capsule Take 1 capsule Daily for Indigestion & Reflux (Patient taking differently: Take 30 mg by mouth daily at 12 noon. Take 1 capsule Daily for  Indigestion & Reflux)   . metoprolol tartrate (LOPRESSOR) 25 MG tablet Take 1 tablet (25 mg total) by mouth 2 (two) times daily.   . Multiple Vitamins-Minerals (ZINC PO) Take 1 tablet by mouth daily.   Marland Kitchen PARoxetine (PAXIL) 20 MG tablet Take 1 tablet Daily for Mood (Patient taking differently: Take 20 mg by mouth daily. Take 1 tablet Daily for Mood)   . pilocarpine (PILOCAR) 1 % ophthalmic solution 1 drop 2 (two) times daily.   . promethazine-dextromethorphan (PROMETHAZINE-DM) 6.25-15 MG/5ML syrup Take 1 to 2 tsp enery 4 hours if needed for cough    No facility-administered encounter medications on file as of 04/20/2019.   Have advised daughter about [facility] services and she would like her mother to participate. Have given daughter my contact information and instructed her to call if any problems arise before our next call on 05/01/2019;  Kayleen Memos C. [name], MSN, [facility] Gerontological Nurse Practitioner [facility] Care Management [phone]

## 2019-04-21 ENCOUNTER — Ambulatory Visit: Payer: Self-pay

## 2019-04-23 ENCOUNTER — Encounter: Payer: Self-pay | Admitting: Internal Medicine

## 2019-04-23 NOTE — Patient Instructions (Signed)

## 2019-04-23 NOTE — Progress Notes (Signed)
Darlene [name]     This very nice 79 y.o.  WWF was admitted to the hospital on  15 Apr 2019 and patient was discharged from the hospital on 17 April 2019. The patient now presents for follow up for transition from recent hospitalization.  The day after discharge  our clinical staff contacted the patient to assure stability and schedule a follow up appointment. The discharge summary, medications and diagnostic test results were reviewed before meeting with the patient. The patient was admitted for:   1. Rapid atrial fibrillation 2. Hypokalemia 3. Glucose intolerance 4. Chronic anemia 5. Nocturnal oxygen desaturation 6. Gastroesophageal reflux disease 7 . UTI, E.coli     Patient was hospitalized and initially given IV Diltiazem and as rate was controlled, she was transitioned to oral Diltiazem.   Cardiac enzymes & 2D echo were unremarkable. Post discharge Urine culture grew E. Coli which was treated empirically pending culture at discharge. Patient has developed a morbilliform rash of the upper trunk consistent with drug rash. Labs found an iron deficient anemia and she was recommended to take Iron OTC. She also was noted to have nocturnal oxygen desaturations and was recommended to obtain out-patient sleep studies.       Hospitalization discharge instructions and medications are reconciled with the patient a7 her daughter.      Patient is also followed with Hypertension, Hyperlipidemia, Pre-Diabetes and Vitamin D Deficiency.      Patient has been followed expectantly for labile  HTN & BP has been controlled at home. Today's BP is at goal  - 112/80. Patient has had no complaints of any cardiac type chest pain, palpitations, dyspnea/orthopnea/PND, dizziness, claudication, or dependent edema.       Also, the patient has been followed expectantly for glucose intolerance and has had no symptoms of reactive hypoglycemia, diabetic polys, paresthesias or visual blurring.  Last A1c was at  goal, but glucoses were elevate in hospital (? On IVF):  Lab Results  Component Value Date   HGBA1C 5.6 04/16/2019      Further, the patient also has history of Vitamin D Deficiency ("28" / 2019)  and supplements vitamin D without any suspected side-effects. Last vitamin D was at goal:  Lab Results  Component Value Date   VD25OH 61 09/01/2018   Current Outpatient Medications on File Prior to Visit  Medication Sig  . acetaminophen (TYLENOL) 500 MG tablet Take 500 mg by mouth every 6 (six) hours as needed.  Marland Kitchen apixaban (ELIQUIS) 5 MG TABS tablet Take 1 tablet (5 mg total) by mouth 2 (two) times daily.  . Ascorbic Acid (VITAMIN C) 500 MG CAPS Take by mouth.  . cefdinir (OMNICEF) 300 MG capsule Take 1 capsule (300 mg total) by mouth every 12 (twelve) hours.  . CHOLECALCIFEROL PO Take 5,000 Units by mouth daily.  . ciprofloxacin (CIPRO) 250 MG tablet Take 1 tablet 2 x /day with Food for Infection  . cyanocobalamin 1000 MCG tablet Take 1,000 mcg by mouth daily.  . cyclobenzaprine (FLEXERIL) 10 MG tablet Take 1/2 to 1 tablet 2 to 3 x  /day if needed for Muscle Spasm  . diltiazem (CARDIZEM CD) 240 MG 24 hr capsule Take 1 capsule (240 mg total) by mouth daily.  . ferrous sulfate 325 (65 FE) MG tablet Take 1 tablet (325 mg total) by mouth daily with breakfast.  . lansoprazole (PREVACID) 30 MG capsule Take 1 capsule Daily for Indigestion & Reflux (Patient taking differently: Take 30 mg by mouth daily  at 12 noon. Take 1 capsule Daily for Indigestion & Reflux)  . metoprolol tartrate (LOPRESSOR) 25 MG tablet Take 1 tablet (25 mg total) by mouth 2 (two) times daily.  Marland Kitchen PARoxetine (PAXIL) 20 MG tablet Take 1 tablet Daily for Mood (Patient taking differently: Take 20 mg by mouth daily. Take 1 tablet Daily for Mood)  . pilocarpine (PILOCAR) 1 % ophthalmic solution 1 drop 2 (two) times daily.  . Probiotic Product (PROBIOTIC DAILY PO) Take 1 tablet by mouth daily.  . promethazine-dextromethorphan  (PROMETHAZINE-DM) 6.25-15 MG/5ML syrup Take 1 to 2 tsp enery 4 hours if needed for cough  . zinc gluconate 50 MG tablet Take 50 mg by mouth daily.  . diclofenac (VOLTAREN) 75 MG EC tablet Take 1 tablet 2 x /day with Food for Pain & Inflammation (Patient not taking: Reported on 04/24/2019)  . Multiple Vitamins-Minerals (ZINC PO) Take 1 tablet by mouth daily.   No current facility-administered medications on file prior to visit.   Allergy - to Cephalosporin (rash)   PMHx:   Past Medical History:  Diagnosis Date  . Anxiety   . Chronic lower back pain   . Depression   . DJD (degenerative joint disease)    knees, neck  . GERD (gastroesophageal reflux disease)    has had dilitation in the past   Immunization History  Administered Date(s) Administered  . Influenza, High Dose Seasonal PF 01/29/2018  . Influenza-Unspecified 12/09/2016, 11/28/2018  . Moderna SARS-COVID-2 Vaccination 03/28/2019  . Pneumococcal Conjugate-13 02/12/2016, 09/30/2017  . Pneumococcal Polysaccharide-23 03/16/2006, 04/02/2008  . Zoster 07/31/2013   Past Surgical History:  Procedure Laterality Date  . ABDOMINAL HYSTERECTOMY  03/1979  . BREAST BIOPSY Left 1986 and 1987  . CATARACT EXTRACTION, BILATERAL Bilateral 2014   Dr. Madelin Headings, in [place]   FHx:    Reviewed / unchanged  SHx:    Reviewed / unchanged  Systems Review:  Constitutional: Denies fever, chills, wt changes, headaches, insomnia, fatigue, night sweats, change in appetite. Eyes: Denies redness, blurred vision, diplopia, discharge, itchy, watery eyes.  ENT: Denies discharge, congestion, post nasal drip, epistaxis, sore throat, earache, hearing loss, dental pain, tinnitus, vertigo, sinus pain, snoring.  CV: Denies chest pain, palpitations, irregular heartbeat, syncope, dyspnea, diaphoresis, orthopnea, PND, claudication or edema. Respiratory: denies cough, dyspnea, DOE, pleurisy, hoarseness, laryngitis, wheezing.  Gastrointestinal: Denies dysphagia,  odynophagia, heartburn, reflux, water brash, abdominal pain or cramps, nausea, vomiting, bloating, diarrhea, constipation, hematemesis, melena, hematochezia  or hemorrhoids. Genitourinary: Denies dysuria, frequency, urgency, nocturia, hesitancy, discharge, hematuria or flank pain. Musculoskeletal: Denies arthralgias, myalgias, stiffness, jt. swelling, pain, limping or strain/sprain.  Skin: Denies pruritus, rash, hives, warts, acne, eczema or change in skin lesion(s). Neuro: No weakness, tremor, incoordination, spasms, paresthesia or pain. Psychiatric: Denies confusion, memory loss or sensory loss. Endo: Denies change in weight, skin or hair change.  Heme/Lymph: No excessive bleeding, bruising or enlarged lymph nodes.  Physical Exam  BP 112/80   Pulse 80   Temp (!) 97 F (36.1 C)   Resp 16   Ht 5' 9.5" (1.765 m)   Wt 221 lb 9.6 oz (100.5 kg)   BMI 32.26 kg/m   Appears well nourished, well groomed  and in no distress.  Eyes: PERRLA, EOMs, conjunctiva no swelling or erythema. Sinuses: No frontal/maxillary tenderness ENT/Mouth: EAC's clear, TM's nl w/o erythema, bulging. Nares clear w/o erythema, swelling, exudates. Oropharynx clear without erythema or exudates. Oral hygiene is good. Tongue normal, non obstructing. Hearing intact.  Neck: Supple. Thyroid nl. Musician  2+/2+ without bruits, nodes or JVD. Chest: Respirations nl with BS clear & equal w/o rales, rhonchi, wheezing or stridor.  Cor: Heart sounds normal w/ regular rate and rhythm without sig. murmurs, gallops, clicks or rubs. Peripheral pulses normal and equal  without edema.  Abdomen: Soft & bowel sounds normal. Non-tender w/o guarding, rebound, hernias, masses or organomegaly.  Lymphatics: Unremarkable.  Musculoskeletal: Full ROM all peripheral extremities, joint stability, 5/5 strength and normal gait.  Skin: Warm, dry without  lesions or ecchymosis apparent. Morbilliform rash of upper trunk to neck.  Neuro: Cranial nerves intact,  reflexes equal bilaterally. Sensory-motor testing grossly intact. Tendon reflexes grossly intact.  Pysch: Alert & oriented x 3.  Insight and judgement nl & appropriate. No ideations.  Assessment and Plan:  1. Rapid atrial fibrillation, hx  (HCC)  - TSH - EKG 12-Lead - Afib - rate controlled  2. Hypokalemia  - COMPLETE METABOLIC PANEL WITH GFR - Magnesium  3. Glucose intolerance  - COMPLETE METABOLIC PANEL WITH GFR  4. Chronic anemia  - CBC with Differential/Platelet  5. Nocturnal oxygen desaturation  - Ambulatory referral to Sleep Studies  6. Gastroesophageal reflux disease  - CBC with Differential/Platelet  7. Screening for ischemic heart disease   8. FHx: heart disease   9. Abnormal glucose   10. E-coli UTI  - D/c Omnicef for suspected drug Allergy  - Has Rx Cipro which she hasn't started.   11. Medication management  - CBC with Differential/Platelet - COMPLETE METABOLIC PANEL WITH GFR - Magnesium - TSH - EKG 12-Lead       Recc d/c Omnicef  for suspected Cephalosporin allergy. Discussed  regular exercise, BP monitoring, weight control to achieve/maintain BMI less than 25 and discussed meds and SE's. Recommended labs to assess and monitor clinical status with further disposition pending results of labs. Over 30 minutes of exam, counseling, chart review was  performed.  This note is not being shared with the patient for the following reason: To prevent harm  (release of this note would result in harm to the life or physical safety of the patient or another).

## 2019-04-24 ENCOUNTER — Ambulatory Visit (INDEPENDENT_AMBULATORY_CARE_PROVIDER_SITE_OTHER): Payer: Medicare Other | Admitting: Internal Medicine

## 2019-04-24 ENCOUNTER — Other Ambulatory Visit: Payer: Self-pay

## 2019-04-24 VITALS — BP 112/80 | HR 80 | Temp 97.0°F | Resp 16 | Ht 69.5 in | Wt 221.6 lb

## 2019-04-24 DIAGNOSIS — E876 Hypokalemia: Secondary | ICD-10-CM | POA: Diagnosis not present

## 2019-04-24 DIAGNOSIS — N390 Urinary tract infection, site not specified: Secondary | ICD-10-CM

## 2019-04-24 DIAGNOSIS — E7439 Other disorders of intestinal carbohydrate absorption: Secondary | ICD-10-CM | POA: Diagnosis not present

## 2019-04-24 DIAGNOSIS — L270 Generalized skin eruption due to drugs and medicaments taken internally: Secondary | ICD-10-CM | POA: Diagnosis not present

## 2019-04-24 DIAGNOSIS — G4734 Idiopathic sleep related nonobstructive alveolar hypoventilation: Secondary | ICD-10-CM | POA: Diagnosis not present

## 2019-04-24 DIAGNOSIS — I4891 Unspecified atrial fibrillation: Secondary | ICD-10-CM

## 2019-04-24 DIAGNOSIS — Z136 Encounter for screening for cardiovascular disorders: Secondary | ICD-10-CM | POA: Diagnosis not present

## 2019-04-24 DIAGNOSIS — Z79899 Other long term (current) drug therapy: Secondary | ICD-10-CM | POA: Diagnosis not present

## 2019-04-24 DIAGNOSIS — Z8249 Family history of ischemic heart disease and other diseases of the circulatory system: Secondary | ICD-10-CM | POA: Diagnosis not present

## 2019-04-24 DIAGNOSIS — R7309 Other abnormal glucose: Secondary | ICD-10-CM

## 2019-04-24 DIAGNOSIS — K219 Gastro-esophageal reflux disease without esophagitis: Secondary | ICD-10-CM

## 2019-04-24 DIAGNOSIS — B9620 Unspecified Escherichia coli [E. coli] as the cause of diseases classified elsewhere: Secondary | ICD-10-CM

## 2019-04-24 DIAGNOSIS — D649 Anemia, unspecified: Secondary | ICD-10-CM

## 2019-04-24 MED ORDER — DEXAMETHASONE 4 MG PO TABS: ORAL_TABLET | ORAL | 0 refills | Status: DC

## 2019-04-25 ENCOUNTER — Other Ambulatory Visit: Payer: Self-pay | Admitting: *Deleted

## 2019-04-25 ENCOUNTER — Other Ambulatory Visit: Payer: Self-pay | Admitting: Internal Medicine

## 2019-04-25 DIAGNOSIS — K2100 Gastro-esophageal reflux disease with esophagitis, without bleeding: Secondary | ICD-10-CM

## 2019-04-25 LAB — CBC WITH DIFFERENTIAL/PLATELET
Absolute Monocytes: 513 cells/uL (ref 200–950)
Basophils Absolute: 57 cells/uL (ref 0–200)
Basophils Relative: 0.6 %
Eosinophils Absolute: 67 cells/uL (ref 15–500)
Eosinophils Relative: 0.7 %
HCT: 35.4 % (ref 35.0–45.0)
Hemoglobin: 11.4 g/dL — ABNORMAL LOW (ref 11.7–15.5)
Lymphs Abs: 1948 cells/uL (ref 850–3900)
MCH: 28.7 pg (ref 27.0–33.0)
MCHC: 32.2 g/dL (ref 32.0–36.0)
MCV: 89.2 fL (ref 80.0–100.0)
MPV: 11 fL (ref 7.5–12.5)
Monocytes Relative: 5.4 %
Neutro Abs: 6916 cells/uL (ref 1500–7800)
Neutrophils Relative %: 72.8 %
Platelets: 420 10*3/uL — ABNORMAL HIGH (ref 140–400)
RBC: 3.97 10*6/uL (ref 3.80–5.10)
RDW: 16.5 % — ABNORMAL HIGH (ref 11.0–15.0)
Total Lymphocyte: 20.5 %
WBC: 9.5 10*3/uL (ref 3.8–10.8)

## 2019-04-25 LAB — COMPLETE METABOLIC PANEL WITH GFR
AG Ratio: 1.6 (calc) (ref 1.0–2.5)
ALT: 47 U/L — ABNORMAL HIGH (ref 6–29)
AST: 12 U/L (ref 10–35)
Albumin: 3.5 g/dL — ABNORMAL LOW (ref 3.6–5.1)
Alkaline phosphatase (APISO): 95 U/L (ref 37–153)
BUN: 17 mg/dL (ref 7–25)
CO2: 26 mmol/L (ref 20–32)
Calcium: 9.7 mg/dL (ref 8.6–10.4)
Chloride: 106 mmol/L (ref 98–110)
Creat: 0.78 mg/dL (ref 0.60–0.93)
GFR, Est African American: 84 mL/min/{1.73_m2} (ref >=60)
GFR, Est Non African American: 72 mL/min/{1.73_m2} (ref >=60)
Globulin: 2.2 g/dL (calc) (ref 1.9–3.7)
Glucose, Bld: 118 mg/dL — ABNORMAL HIGH (ref 65–99)
Potassium: 4.7 mmol/L (ref 3.5–5.3)
Sodium: 144 mmol/L (ref 135–146)
Total Bilirubin: 0.3 mg/dL (ref 0.2–1.2)
Total Protein: 5.7 g/dL — ABNORMAL LOW (ref 6.1–8.1)

## 2019-04-25 LAB — TSH: TSH: 2.42 mIU/L (ref 0.40–4.50)

## 2019-04-25 LAB — MAGNESIUM: Magnesium: 1.9 mg/dL (ref 1.5–2.5)

## 2019-04-26 NOTE — Progress Notes (Signed)
Cardiology Office Note    Date:  05/01/2019   ID:  Corella, Demchak 1940-08-31, MRN [id number]  PCP:  [name], MD  Cardiologist: Kate Sable, MD EPS: None  Chief Complaint  Patient presents with  . Hospitalization Follow-up    History of Present Illness:  Darlene [name] is a 79 y.o. female with history of HLD, GERD, anxiety was admitted to [facility] 04/15/19 with A. fib with RVR, CHA2DS2-VASc equals 3-4 started on Eliquis and diltiazem. 2D echo normal LVEF 55 to 60% with mild LVH severe LA dilatation 4.6 cm, RV moderately dilated, moderate elevation of PASP. Patient didn't feel heart racing and unlikely to hold NSR with severely dilated LA so rate control was the plan.  Patient comes in accompanied by her daughter. She is very weak and unsteady. Sometimes gets dizzy but having a hard time getting strength back. CBC and BMET last week stable.  Daughter has list of her heart rates when they range from mid 80s to 118 at rest.  Past Medical History:  Diagnosis Date  . Anxiety   . Chronic lower back pain   . Depression   . DJD (degenerative joint disease)    knees, neck  . GERD (gastroesophageal reflux disease)    has had dilitation in the past    Past Surgical History:  Procedure Laterality Date  . ABDOMINAL HYSTERECTOMY  03/1979  . BREAST BIOPSY Left 1986 and 1987  . CATARACT EXTRACTION, BILATERAL Bilateral 2014   Dr. Madelin Headings, in [place]    Current Medications: Current Meds  Medication Sig  . acetaminophen (TYLENOL) 500 MG tablet Take 500 mg by mouth every 6 (six) hours as needed.  Marland Kitchen apixaban (ELIQUIS) 5 MG TABS tablet Take 1 tablet (5 mg total) by mouth 2 (two) times daily.  . Ascorbic Acid (VITAMIN C) 500 MG CAPS Take by mouth.  . cefdinir (OMNICEF) 300 MG capsule Take 1 capsule (300 mg total) by mouth every 12 (twelve) hours.  . CHOLECALCIFEROL PO Take 5,000 Units by mouth daily.  . cyanocobalamin 1000 MCG tablet Take 1,000 mcg by mouth daily.    . cyclobenzaprine (FLEXERIL) 10 MG tablet Take 1/2 to 1 tablet    2 to 3 x /day if needed for Muscle Spasms  . dexamethasone (DECADRON) 4 MG tablet Take 1 tab 3 x day - 2 days, then 2 x day - 2 days, then 1 tab daily for Drug rash  . diltiazem (CARDIZEM CD) 240 MG 24 hr capsule Take 1 capsule (240 mg total) by mouth daily.  . ferrous sulfate 325 (65 FE) MG tablet Take 1 tablet (325 mg total) by mouth daily with breakfast.  . lansoprazole (PREVACID) 30 MG capsule Take 1 capsule Daily for Heartburn & Indigestion  . Multiple Vitamins-Minerals (ZINC PO) Take 1 tablet by mouth daily.  Marland Kitchen PARoxetine (PAXIL) 20 MG tablet Take 1 tablet Daily for Mood (Patient taking differently: Take 20 mg by mouth daily. Take 1 tablet Daily for Mood)  . pilocarpine (PILOCAR) 1 % ophthalmic solution 1 drop 2 (two) times daily.  . Probiotic Product (PROBIOTIC DAILY PO) Take 1 tablet by mouth daily.  . promethazine-dextromethorphan (PROMETHAZINE-DM) 6.25-15 MG/5ML syrup Take 1 to 2 tsp enery 4 hours if needed for cough  . zinc gluconate 50 MG tablet Take 50 mg by mouth daily.  . [DISCONTINUED] ciprofloxacin (CIPRO) 250 MG tablet Take 1 tablet 2 x /day with Food for Infection  . [DISCONTINUED] diclofenac (VOLTAREN) 75 MG EC  tablet Take 1 tablet 2 x /day with Food for Pain & Inflammation  . [DISCONTINUED] metoprolol tartrate (LOPRESSOR) 25 MG tablet Take 1 tablet (25 mg total) by mouth 2 (two) times daily.     Allergies:   Cephalosporins   Social History   Socioeconomic History  . Marital status: Widowed    Spouse name: Not on file  . Number of children: 2  . Years of education: Not on file  . Highest education level: Not on file  Occupational History  . Occupation: retired    Comment: worked in Academic librarian  . Smoking status: Never Smoker  . Smokeless tobacco: Never Used  Substance and Sexual Activity  . Alcohol use: Yes    Alcohol/week: 3.0 standard drinks    Types: 3 Standard drinks or equivalent  per week    Comment: 3 nights out of the week  . Drug use: No  . Sexual activity: Not Currently  Other Topics Concern  . Not on file  Social History Narrative  . Not on file   Social Determinants of Health   Financial Resource Strain: Low Risk   . Difficulty of Paying Living Expenses: Not very hard  Food Insecurity: No Food Insecurity  . Worried About Charity fundraiser in the Last Year: Never true  . Ran Out of Food in the Last Year: Never true  Transportation Needs: No Transportation Needs  . Lack of Transportation (Medical): No  . Lack of Transportation (Non-Medical): No  Physical Activity: Inactive  . Days of Exercise per Week: 0 days  . Minutes of Exercise per Session: 0 min  Stress: No Stress Concern Present  . Feeling of Stress : Only a little  Social Connections: Slightly Isolated  . Frequency of Communication with Friends and Family: More than three times a week  . Frequency of Social Gatherings with Friends and Family: More than three times a week  . Attends Religious Services: 1 to 4 times per year  . Active Member of Clubs or Organizations: Yes  . Attends Archivist Meetings: More than 4 times per year  . Marital Status: Widowed     Family History:  The patient's   family history includes Dementia in her brother; Heart disease in her father and mother; Hypertension in her daughter, father, and mother; Tremor in her sister.   ROS:   Please see the history of present illness.    ROS All other systems reviewed and are negative.   PHYSICAL EXAM:   VS:  BP 140/70   Pulse 78   Temp (!) 97.2 F (36.2 C)   Ht 5\' 10"  (1.778 m)   Wt 219 lb (99.3 kg)   SpO2 98%   BMI 31.42 kg/m   Physical Exam  GEN: Well nourished, well developed, in no acute distress  Neck: no JVD, carotid bruits, or masses Cardiac:irreg irreg; no murmurs, rubs, or gallops  Respiratory:  clear to auscultation bilaterally, normal work of breathing GI: soft, nontender, nondistended, +  BS Ext: without cyanosis, clubbing, or edema, Good distal pulses bilaterally Neuro:  Alert and Oriented x 3 Psych: euthymic mood, full affect  Wt Readings from Last 3 Encounters:  05/01/19 219 lb (99.3 kg)  04/24/19 221 lb 9.6 oz (100.5 kg)  04/17/19 223 lb 5.2 oz (101.3 kg)      Studies/Labs Reviewed:   EKG:  EKG is not ordered today.  Recent Labs: 04/24/2019: ALT 47; BUN 17; Creat 0.78; Hemoglobin 11.4; Magnesium  1.9; Platelets 420; Potassium 4.7; Sodium 144; TSH 2.42   Lipid Panel    Component Value Date/Time   CHOL 162 04/16/2019 0435   TRIG 100 04/16/2019 0435   HDL 32 (L) 04/16/2019 0435   CHOLHDL 5.1 04/16/2019 0435   VLDL 20 04/16/2019 0435   LDLCALC 110 (H) 04/16/2019 0435   LDLCALC 122 (H) 04/03/2019 1211    Additional studies/ records that were reviewed today include:  Echo 04/16/19 IMPRESSIONS     1. Left ventricular ejection fraction, by visual estimation, is 55 to  60%. The left ventricle has normal function. There is mildly increased  left ventricular hypertrophy.   2. Left ventricular diastolic parameters are indeterminate.   3. The left ventricle has no regional wall motion abnormalities.   4. Global right ventricle has normal systolic function.The right  ventricular size is moderately enlarged. No increase in right ventricular  wall thickness.   5. Left atrial size was severely dilated.   6. Right atrial size was moderately dilated.   7. The mitral valve is normal in structure. Mild mitral valve  regurgitation. No evidence of mitral stenosis.   8. The tricuspid valve is normal in structure.   9. The tricuspid valve is normal in structure. Tricuspid valve  regurgitation is mild.  10. The aortic valve is tricuspid. Aortic valve regurgitation is not  visualized. No evidence of aortic valve sclerosis or stenosis.  11. The pulmonic valve was not well visualized. Pulmonic valve  regurgitation is not visualized.  12. Moderately elevated pulmonary artery  systolic pressure.  13. The inferior vena cava is dilated in size with <50% respiratory  variability, suggesting right atrial pressure of 15 mmHg.          ASSESSMENT:    1. Persistent atrial fibrillation ([place])   2. Hyperlipidemia, unspecified hyperlipidemia type   3. Obesity (BMI 30.0-34.9)   4. Fatigue, unspecified type      PLAN:  In order of problems listed above:  Atrial fibrillation with RVR treated with diltiazem. CHA2DS2-VASc equals 3 for age, female, prediabetes on Eliquis. Severe left atrial dilatation and unlikely to hold normal sinus rhythm so plan is for rate control.  Heart rates up to 118 at home at rest.  Patient is very weak and still recovering from hospitalization.  CBC TSH and cmet checked last week were all normal will increase metoprolol to 50 mg in the morning 25 in the afternoon.  Follow-up with Dr. Bronson Ing who also mentioned working her up for possible pulmonary hypertension  Hyperlipidemia LDL 110 currently not on any therapy  Obesity weight loss would be beneficial to her overall health  Fatigue and weakness since recent hospitalization.  Not sure that this is related to the A. fib but will increase metoprolol for better rate control and see how she does.    Medication Adjustments/Labs and Tests Ordered: Current medicines are reviewed at length with the patient today.  Concerns regarding medicines are outlined above.  Medication changes, Labs and Tests ordered today are listed in the Patient Instructions below. Patient Instructions  Medication Instructions:  Your physician has recommended you make the following change in your medication:  Take Lopressor 50 mg in the morning Take 25 mg in the evening.   *If you need a refill on your cardiac medications before your next appointment, please call your pharmacy*  Lab Work: NONE   If you have labs (blood work) drawn today and your tests are completely normal, you will receive your results only  by: . MyChart Message (if you have MyChart) OR . A paper copy in the mail If you have any lab test that is abnormal or we need to change your treatment, we will call you to review the results.  Testing/Procedures: NONE   Follow-Up: At [facility], you and your health needs are our priority.  As part of our continuing mission to provide you with exceptional heart care, we have created designated Provider Care Teams.  These Care Teams include your primary Cardiologist (physician) and Advanced Practice Providers (APPs -  Physician Assistants and Nurse Practitioners) who all work together to provide you with the care you need, when you need it.  Your next appointment:   2 week(s)  The format for your next appointment:   In Person  Provider:   Kate Sable, MD  Other Instructions Thank you for choosing [facility]!       Sumner Boast, PA-C  05/01/2019 12:58 PM    [facility] [place], [place], [place]  [address] Phone: [phone]; Fax: [phone]

## 2019-04-27 ENCOUNTER — Other Ambulatory Visit: Payer: Self-pay | Admitting: *Deleted

## 2019-04-28 ENCOUNTER — Encounter: Payer: Self-pay | Admitting: *Deleted

## 2019-04-28 DIAGNOSIS — Z23 Encounter for immunization: Secondary | ICD-10-CM | POA: Diagnosis not present

## 2019-04-28 NOTE — Patient Outreach (Signed)
Telephone assessment to address red flag on [facility] discharge: depression.  Talked with pt's daughter, Darlene [name]. Advised the reason for the call and asked her if she has observed a change in her mothers mood as she reported feeling down and loosing interest in her usual activities. Darlene [name] reports that her mother has had long standing depression and that she is taking medication for it. She says she has not had any sudden change in her mood, although having a new health problem (AFIB) has had a significant impact on her physical condition, being weak, not back to her baseline yet.   Asked to talk with Darlene [name] and she was readily available and talked at length with me regarding her well-being. She said this new problem has taken it's toll on her and she is recovering but it's taking longer than expected.  Ensured her that getting the appropriate treatment to help manage her heart rate and being on a blood thinner to prevent clot formation and preventing serious complications is the goal is her current therapy. Encouraged her to increase her activities as she is able to regain her previous status.  Pt acknowledges that she does not have Advanced Directives. Will send and discuss in future conversation.  Patient was recently discharged from hospital and all medications have been reviewed. Outpatient Encounter Medications as of 04/25/2019  Medication Sig Note  . acetaminophen (TYLENOL) 500 MG tablet Take 500 mg by mouth every 6 (six) hours as needed.   Marland Kitchen apixaban (ELIQUIS) 5 MG TABS tablet Take 1 tablet (5 mg total) by mouth 2 (two) times daily.   . Ascorbic Acid (VITAMIN C) 500 MG CAPS Take by mouth.   . cefdinir (OMNICEF) 300 MG capsule Take 1 capsule (300 mg total) by mouth every 12 (twelve) hours.   . CHOLECALCIFEROL PO Take 5,000 Units by mouth daily. 03/31/2018: 7,000units  . ciprofloxacin (CIPRO) 250 MG tablet Take 1 tablet 2 x /day with Food for Infection   . cyanocobalamin 1000 MCG tablet  Take 1,000 mcg by mouth daily.   . cyclobenzaprine (FLEXERIL) 10 MG tablet Take 1/2 to 1 tablet    2 to 3 x /day if needed for Muscle Spasms   . dexamethasone (DECADRON) 4 MG tablet Take 1 tab 3 x day - 2 days, then 2 x day - 2 days, then 1 tab daily for Drug rash   . diclofenac (VOLTAREN) 75 MG EC tablet Take 1 tablet 2 x /day with Food for Pain & Inflammation (Patient not taking: Reported on 04/24/2019)   . diltiazem (CARDIZEM CD) 240 MG 24 hr capsule Take 1 capsule (240 mg total) by mouth daily.   . ferrous sulfate 325 (65 FE) MG tablet Take 1 tablet (325 mg total) by mouth daily with breakfast.   . lansoprazole (PREVACID) 30 MG capsule Take 1 capsule Daily for Heartburn & Indigestion   . metoprolol tartrate (LOPRESSOR) 25 MG tablet Take 1 tablet (25 mg total) by mouth 2 (two) times daily.   . Multiple Vitamins-Minerals (ZINC PO) Take 1 tablet by mouth daily.   Marland Kitchen PARoxetine (PAXIL) 20 MG tablet Take 1 tablet Daily for Mood (Patient taking differently: Take 20 mg by mouth daily. Take 1 tablet Daily for Mood)   . pilocarpine (PILOCAR) 1 % ophthalmic solution 1 drop 2 (two) times daily.   . Probiotic Product (PROBIOTIC DAILY PO) Take 1 tablet by mouth daily.   . promethazine-dextromethorphan (PROMETHAZINE-DM) 6.25-15 MG/5ML syrup Take 1 to 2 tsp enery 4 hours  if needed for cough   . zinc gluconate 50 MG tablet Take 50 mg by mouth daily.    No facility-administered encounter medications on file as of 04/25/2019.   Fall Risk  04/03/2019 09/04/2018 09/04/2018 03/31/2018 03/31/2018  Falls in the past year? 0 0 0 0 1  Number falls in past yr: 0 - - - 0  Comment - - - - was at bed time, just fell  Injury with Fall? 0 - - - 0  Risk for fall due to : - - - - History of fall(s)  Follow up Falls evaluation completed;Falls prevention discussed Falls evaluation completed;Education provided;Falls prevention discussed Falls evaluation completed;Education provided;Falls prevention discussed - Falls evaluation  completed;Education provided;Falls prevention discussed;Follow up appointment   Depression screen [facility] 2/9 04/28/2019 04/03/2019 09/04/2018 09/04/2018 03/31/2018  Decreased Interest 1 0 0 0 0  Down, Depressed, Hopeless 1 0 0 0 0  PHQ - 2 Score 2 0 0 0 0  Altered sleeping 1 - - - -  Tired, decreased energy 1 - - - -  Change in appetite 0 - - - -  Feeling bad or failure about yourself  0 - - - -  Trouble concentrating 0 - - - -  Moving slowly or fidgety/restless 0 - - - -  Suicidal thoughts 0 - - - -  PHQ-9 Score 4 - - - -  Difficult doing work/chores Not difficult at all - - - -    [facility] CM Care Plan Problem One     Most Recent Value  Care Plan Problem One  New diagnosis: AFIB  Role Documenting the Problem One  Care Management Coordinator  Care Plan for Problem One  Active  [facility]N Long Term Goal   Pt will be able to describe her health problem and the things she does to manage this by the end of 60 days.  [facility] Long Term Goal Start Date  04/25/19  Interventions for Problem One Long Term Goal  Assessed current knowledge. Pt does not recall getting any educational material in the hospital. Educated pt on basic dx and rationale for the treatment she is getting. Will send [facility] programs for further education.  [facility] CM Short Term Goal #1   Pt will recieve [facility] educational materials and acknowledge she has read them and will review with NP within the next 30 days.  [facility] CM Short Term Goal #1 Start Date  04/25/19  Interventions for Short Term Goal #1  Sending [facility] materials to pt regarding AFIB.    [facility] CM Care Plan Problem Two     Most Recent Value  Care Plan Problem Two  No Advanced Directives  Role Documenting the Problem Two  Care Management Coordinator  Care Plan for Problem Two  Active  Interventions for Problem Two Long Term Goal   Sending Advanced Directives documents.  THN Long Term Goal  Pt will complete her Advanced Directives within the next 60 days.  [facility]N Long Term Goal Start Date  04/28/19      Pt has not had an eye exam within the last year. Advised that this is recommended as she does have glaucoma.  No further needs at this time. I will be calling her again in a week after she sees her cardiologist.   Kayleen Memos C. [name], MSN, [facility] Gerontological Nurse Practitioner [facility] Care Management [phone]

## 2019-05-01 ENCOUNTER — Encounter: Payer: Self-pay | Admitting: Physician Assistant

## 2019-05-01 ENCOUNTER — Other Ambulatory Visit: Payer: Self-pay

## 2019-05-01 ENCOUNTER — Ambulatory Visit (INDEPENDENT_AMBULATORY_CARE_PROVIDER_SITE_OTHER): Payer: Medicare Other | Admitting: Physician Assistant

## 2019-05-01 VITALS — BP 140/70 | HR 78 | Temp 97.2°F | Ht 70.0 in | Wt 219.0 lb

## 2019-05-01 DIAGNOSIS — E669 Obesity, unspecified: Secondary | ICD-10-CM

## 2019-05-01 DIAGNOSIS — R5383 Other fatigue: Secondary | ICD-10-CM

## 2019-05-01 DIAGNOSIS — E785 Hyperlipidemia, unspecified: Secondary | ICD-10-CM | POA: Diagnosis not present

## 2019-05-01 DIAGNOSIS — I4819 Other persistent atrial fibrillation: Secondary | ICD-10-CM | POA: Diagnosis not present

## 2019-05-01 MED ORDER — METOPROLOL TARTRATE 25 MG PO TABS: ORAL_TABLET | ORAL | 11 refills | Status: DC

## 2019-05-01 NOTE — Patient Instructions (Signed)
Medication Instructions:  Your physician has recommended you make the following change in your medication:  Take Lopressor 50 mg in the morning Take 25 mg in the evening.   *If you need a refill on your cardiac medications before your next appointment, please call your pharmacy*  Lab Work: NONE   If you have labs (blood work) drawn today and your tests are completely normal, you will receive your results only by: Marland Kitchen MyChart Message (if you have MyChart) OR . A paper copy in the mail If you have any lab test that is abnormal or we need to change your treatment, we will call you to review the results.  Testing/Procedures: NONE   Follow-Up: At [facility], you and your health needs are our priority.  As part of our continuing mission to provide you with exceptional heart care, we have created designated Provider Care Teams.  These Care Teams include your primary Cardiologist (physician) and Advanced Practice Providers (APPs -  Physician Assistants and Nurse Practitioners) who all work together to provide you with the care you need, when you need it.  Your next appointment:   2 week(s)  The format for your next appointment:   In Person  Provider:   Kate Sable, MD  Other Instructions Thank you for choosing [facility]!

## 2019-05-02 ENCOUNTER — Other Ambulatory Visit: Payer: Self-pay | Admitting: *Deleted

## 2019-05-02 NOTE — Patient Outreach (Signed)
Telephone outreach, follow up for AFIB.  Pt went to cardiology yesterday and learned that a cardioversion is not being considered at this time. Her heart rate is fairly well controlled medically. They did increase her lopressor to 50 mg in am and 25 my at night. She is to follow up again in 2 weeks.  Encouraged continuation of her medications as ordered.  I will call again in one week. Pt will have received the educational materials I sent by then and we can discuss those.  Also encouraged completion of her advanced directives.  Darlene [name]. [name], MSN, [facility] Gerontological Nurse Practitioner [facility] Care Management [phone]

## 2019-05-09 ENCOUNTER — Other Ambulatory Visit: Payer: Self-pay | Admitting: *Deleted

## 2019-05-09 ENCOUNTER — Encounter: Payer: Self-pay | Admitting: Neurology

## 2019-05-09 ENCOUNTER — Other Ambulatory Visit: Payer: Self-pay

## 2019-05-09 ENCOUNTER — Ambulatory Visit (INDEPENDENT_AMBULATORY_CARE_PROVIDER_SITE_OTHER): Payer: Medicare Other | Admitting: Neurology

## 2019-05-09 VITALS — BP 105/70 | HR 62 | Temp 97.4°F | Ht 70.0 in | Wt 220.0 lb

## 2019-05-09 DIAGNOSIS — I4891 Unspecified atrial fibrillation: Secondary | ICD-10-CM

## 2019-05-09 DIAGNOSIS — R0902 Hypoxemia: Secondary | ICD-10-CM | POA: Insufficient documentation

## 2019-05-09 DIAGNOSIS — R0683 Snoring: Secondary | ICD-10-CM | POA: Insufficient documentation

## 2019-05-09 DIAGNOSIS — E559 Vitamin D deficiency, unspecified: Secondary | ICD-10-CM | POA: Diagnosis not present

## 2019-05-09 DIAGNOSIS — D509 Iron deficiency anemia, unspecified: Secondary | ICD-10-CM

## 2019-05-09 DIAGNOSIS — Z9981 Dependence on supplemental oxygen: Secondary | ICD-10-CM | POA: Diagnosis not present

## 2019-05-09 DIAGNOSIS — I119 Hypertensive heart disease without heart failure: Secondary | ICD-10-CM | POA: Diagnosis not present

## 2019-05-09 DIAGNOSIS — N3945 Continuous leakage: Secondary | ICD-10-CM | POA: Diagnosis not present

## 2019-05-09 NOTE — Patient Instructions (Signed)
Atrial Fibrillation  Atrial fibrillation is a type of irregular or rapid heartbeat (arrhythmia). In atrial fibrillation, the top part of the heart (atria) beats in an irregular pattern. This makes the heart unable to pump blood normally and effectively. The goal of treatment is to prevent blood clots from forming, control your heart rate, or restore your heartbeat to a normal rhythm. If this condition is not treated, it can cause serious problems, such as a weakened heart muscle (cardiomyopathy) or a stroke. What are the causes? This condition is often caused by medical conditions that damage the heart's electrical system. These include:  High blood pressure (hypertension). This is the most common cause.  Certain heart problems or conditions, such as heart failure, coronary artery disease, heart valve problems, or heart surgery.  Diabetes.  Overactive thyroid (hyperthyroidism).  Obesity.  Chronic kidney disease. In some cases, the cause of this condition is not known. What increases the risk? This condition is more likely to develop in:  Older people.  People who smoke.  Athletes who do endurance exercise.  People who have a family history of atrial fibrillation.  Men.  People who use drugs.  People who drink a lot of alcohol.  People who have lung conditions, such as emphysema, pneumonia, or COPD.  People who have obstructive sleep apnea. What are the signs or symptoms? Symptoms of this condition include:  A feeling that your heart is racing or beating irregularly.  Discomfort or pain in your chest.  Shortness of breath.  Sudden light-headedness or weakness.  Tiring easily during exercise or activity.  Fatigue.  Syncope (fainting).  Sweating. In some cases, there are no symptoms. How is this diagnosed? Your health care provider may detect atrial fibrillation when taking your pulse. If detected, this condition may be diagnosed with:  An electrocardiogram  (ECG) to check electrical signals of the heart.  An ambulatory cardiac monitor to record your heart's activity for a few days.  A transthoracic echocardiogram (TTE) to create pictures of your heart.  A transesophageal echocardiogram (TEE) to create even closer pictures of your heart.  A stress test to check your blood supply while you exercise.  Imaging tests, such as a CT scan or chest X-ray.  Blood tests. How is this treated? Treatment depends on underlying conditions and how you feel when you experience atrial fibrillation. This condition may be treated with:  Medicines to prevent blood clots or to treat heart rate or heart rhythm problems.  Electrical cardioversion to reset the heart's rhythm.  A pacemaker to correct abnormal heart rhythm.  Ablation to remove the heart tissue that sends abnormal signals.  Left atrial appendage closure to seal the area where blood clots can form. In some cases, underlying conditions will be treated. Follow these instructions at home: Medicines  Take over-the counter and prescription medicines only as told by your health care provider.  Do not take any new medicines without talking to your health care provider.  If you are taking blood thinners: ? Talk with your health care provider before you take any medicines that contain aspirin or NSAIDs, such as ibuprofen. These medicines increase your risk for dangerous bleeding. ? Take your medicine exactly as told, at the same time every day. ? Avoid activities that could cause injury or bruising, and follow instructions about how to prevent falls. ? Wear a medical alert bracelet or carry a card that lists what medicines you take. Lifestyle      Do not use any products   that contain nicotine or tobacco, such as cigarettes, e-cigarettes, and chewing tobacco. If you need help quitting, ask your health care provider.  Eat heart-healthy foods. Talk with a dietitian to make an eating plan that is  right for you.  Exercise regularly as told by your health care provider.  Do not drink alcohol.  Lose weight if you are overweight.  Do not use drugs, including cannabis. General instructions  If you have obstructive sleep apnea, manage your condition as told by your health care provider.  Do not use diet pills unless your health care provider approves. Diet pills can make heart problems worse.  Keep all follow-up visits as told by your health care provider. This is important. Contact a health care provider if you:  Notice a change in the rate, rhythm, or strength of your heartbeat.  Are taking a blood thinner and you notice more bruising.  Tire more easily when you exercise or do heavy work.  Have a sudden change in weight. Get help right away if you have:   Chest pain, abdominal pain, sweating, or weakness.  Trouble breathing.  Side effects of blood thinners, such as blood in your vomit, stool, or urine, or bleeding that cannot stop.  Any symptoms of a stroke. "BE FAST" is an easy way to remember the main warning signs of a stroke: ? B - Balance. Signs are dizziness, sudden trouble walking, or loss of balance. ? E - Eyes. Signs are trouble seeing or a sudden change in vision. ? F - Face. Signs are sudden weakness or numbness of the face, or the face or eyelid drooping on one side. ? A - Arms. Signs are weakness or numbness in an arm. This happens suddenly and usually on one side of the body. ? S - Speech. Signs are sudden trouble speaking, slurred speech, or trouble understanding what people say. ? T - Time. Time to call emergency services. Write down what time symptoms started.  Other signs of a stroke, such as: ? A sudden, severe headache with no known cause. ? Nausea or vomiting. ? Seizure. These symptoms may represent a serious problem that is an emergency. Do not wait to see if the symptoms will go away. Get medical help right away. Call your local emergency  services (911 in the [place] Do not drive yourself to the hospital. Summary  Atrial fibrillation is a type of irregular or rapid heartbeat (arrhythmia).  Symptoms include a feeling that your heart is beating fast or irregularly.  You may be given medicines to prevent blood clots or to treat heart rate or heart rhythm problems.  Get help right away if you have signs or symptoms of a stroke.  Get help right away if you cannot catch your breath or have chest pain or pressure. This information is not intended to replace advice given to you by your health care provider. Make sure you discuss any questions you have with your health care provider. Document Revised: 08/24/2018 Document Reviewed: 08/24/2018 Elsevier Patient Education  [place]. Orthostatic Hypotension Blood pressure is a measurement of how strongly, or weakly, your blood is pressing against the walls of your arteries. Orthostatic hypotension is a sudden drop in blood pressure that happens when you quickly change positions, such as when you get up from sitting or lying down. Arteries are blood vessels that carry blood from your heart throughout your body. When blood pressure is too low, you may not get enough blood to your brain or  to the rest of your organs. This can cause weakness, light-headedness, rapid heartbeat, and fainting. This can last for just a few seconds or for up to a few minutes. Orthostatic hypotension is usually not a serious problem. However, if it happens frequently or gets worse, it may be a sign of something more serious. What are the causes? This condition may be caused by:  Sudden changes in posture, such as standing up quickly after you have been sitting or lying down.  Blood loss.  Loss of body fluids (dehydration).  Heart problems.  Hormone (endocrine) problems.  Pregnancy.  Severe infection.  Lack of certain nutrients.  Severe allergic reactions (anaphylaxis).  Certain medicines, such  as blood pressure medicine or medicines that make the body lose excess fluids (diuretics). Sometimes, this condition can be caused by not taking medicine as directed, such as taking too much of a certain medicine. What increases the risk? The following factors may make you more likely to develop this condition:  Age. Risk increases as you get older.  Conditions that affect the heart or the central nervous system.  Taking certain medicines, such as blood pressure medicine or diuretics.  Being pregnant. What are the signs or symptoms? Symptoms of this condition may include:  Weakness.  Light-headedness.  Dizziness.  Blurred vision.  Fatigue.  Rapid heartbeat.  Fainting, in severe cases. How is this diagnosed? This condition is diagnosed based on:  Your medical history.  Your symptoms.  Your blood pressure measurement. Your health care provider will check your blood pressure when you are: ? Lying down. ? Sitting. ? Standing. A blood pressure reading is recorded as two numbers, such as "120 over 80" (or 120/80). The first ("top") number is called the systolic pressure. It is a measure of the pressure in your arteries as your heart beats. The second ("bottom") number is called the diastolic pressure. It is a measure of the pressure in your arteries when your heart relaxes between beats. Blood pressure is measured in a unit called mm Hg. Healthy blood pressure for most adults is 120/80. If your blood pressure is below 90/60, you may be diagnosed with hypotension. Other information or tests that may be used to diagnose orthostatic hypotension include:  Your other vital signs, such as your heart rate and temperature.  Blood tests.  Tilt table test. For this test, you will be safely secured to a table that moves you from a lying position to an upright position. Your heart rhythm and blood pressure will be monitored during the test. How is this treated? This condition may be  treated by:  Changing your diet. This may involve eating more salt (sodium) or drinking more water.  Taking medicines to raise your blood pressure.  Changing the dosage of certain medicines you are taking that might be lowering your blood pressure.  Wearing compression stockings. These stockings help to prevent blood clots and reduce swelling in your legs. In some cases, you may need to go to the hospital for:  Fluid replacement. This means you will receive fluids through an IV.  Blood replacement. This means you will receive donated blood through an IV (transfusion).  Treating an infection or heart problems, if this applies.  Monitoring. You may need to be monitored while medicines that you are taking wear off. Follow these instructions at home: Eating and drinking   Drink enough fluid to keep your urine pale yellow.  Eat a healthy diet, and follow instructions from your health care provider  about eating or drinking restrictions. A healthy diet includes: ? Fresh fruits and vegetables. ? Whole grains. ? Lean meats. ? Low-fat dairy products.  Eat extra salt only as directed. Do not add extra salt to your diet unless your health care provider told you to do that.  Eat frequent, small meals.  Avoid standing up suddenly after eating. Medicines  Take over-the-counter and prescription medicines only as told by your health care provider. ? Follow instructions from your health care provider about changing the dosage of your current medicines, if this applies. ? Do not stop or adjust any of your medicines on your own. General instructions   Wear compression stockings as told by your health care provider.  Get up slowly from lying down or sitting positions. This gives your blood pressure a chance to adjust.  Avoid hot showers and excessive heat as directed by your health care provider.  Return to your normal activities as told by your health care provider. Ask your health care  provider what activities are safe for you.  Do not use any products that contain nicotine or tobacco, such as cigarettes, e-cigarettes, and chewing tobacco. If you need help quitting, ask your health care provider.  Keep all follow-up visits as told by your health care provider. This is important. Contact a health care provider if you:  Vomit.  Have diarrhea.  Have a fever for more than 2-3 days.  Feel more thirsty than usual.  Feel weak and tired. Get help right away if you:  Have chest pain.  Have a fast or irregular heartbeat.  Develop numbness in any part of your body.  Cannot move your arms or your legs.  Have trouble speaking.  Become sweaty or feel light-headed.  Faint.  Feel short of breath.  Have trouble staying awake.  Feel confused. Summary  Orthostatic hypotension is a sudden drop in blood pressure that happens when you quickly change positions.  Orthostatic hypotension is usually not a serious problem.  It is diagnosed by having your blood pressure taken lying down, sitting, and then standing.  It may be treated by changing your diet or adjusting your medicines. This information is not intended to replace advice given to you by your health care provider. Make sure you discuss any questions you have with your health care provider. Document Revised: 08/26/2017 Document Reviewed: 08/26/2017 Elsevier Patient Education  [place].

## 2019-05-09 NOTE — Progress Notes (Signed)
SLEEP MEDICINE CLINIC    Provider:  Larey Seat, MD  Primary Care Physician:  [name], MD [address] [place] [address]     Referring Provider: [name], [place],  [place] [address]          Chief Complaint according to patient   Patient presents with:    . New Patient (Initial Visit)     with daughter, rm 82. when she was in hopital it was noted her oxygen levels would drop. never had a SS before. states that she has woke herself up gasping for air and has been told she snores in sleep      HISTORY OF PRESENT ILLNESS:  Darlene [name] is a 79 year old white female patient seen here upon a referral on 05/09/2019 from Dr. Gaspar Skeeters. Chief concern according to patient : " I was in hospital with atrial fib and low oxygen levels at night" .   I have the pleasure of seeing Darlene [name] today, a right -handed  female with a possible sleep disorder.  She has a  has a past medical history of Chronic lower back pain, Depression, DJD (degenerative joint disease), and GERD (gastroesophageal reflux disease), atrial fibrillation- newly diagnosed in [address].     Family medical /sleep history: both parents died young of heart disease.    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   Pets are present. Tobacco use: never .  ETOH use: 1 drink a week,  Caffeine intake in form of Coffee( 2 cups ) Soda( once a week) Tea ( none ) , and no  energy drinks. Regular exercise - not for a long time.    Hobbies : chicken     Sleep habits are as follows: The patient's dinner time is between 6.30 PM. The patient goes to bed at 9-9.45 PM and continues to sleep for 2-3  hours, wakes for several  bathroom breaks, the first time at 1 AM.  The bedroom is cool, quiet  and dark.  The preferred sleep position is right sided, with the support of 1 pillow.  Dreams are reportedly frequent. 6 AM is the usual rise time.  The patient wakes up at 6 AM with an alarm. The great-grand babies arrive at 41 AM- 65 and 81 years old.  She reports not feeling refreshed or restored in AM, with symptoms such as dry mouth,, and residual fatigue.  Naps are now  taken frequently, lasting from 30-60 minutes and are more refreshing than nocturnal sleep.    Review of Systems: Out of a complete 14 system review, the patient complains of only the following symptoms, and all other reviewed systems are negative.:  Fatigue, sleepiness , snoring, lightheadedness, near syncope. SOB . Tunnel vision," almost blacking out"    How likely are you to doze in the following situations: 0 = not likely, 1 = slight chance, 2 = moderate chance, 3 = high chance   Sitting and Reading? Watching Television? Sitting inactive in a public place (theater or meeting)? As a passenger in a car for an hour without a break? Lying down in the afternoon when circumstances permit? Sitting and talking to someone? Sitting quietly after lunch without alcohol? In a car, while stopped  for a few minutes in traffic?   Total = 10-12 with a daily nap / 24 points   FSS endorsed at 00/ 63 points.  GDS 2/ 15 on paroxetine.   Social History   Socioeconomic History  . Marital status: Widowed    Spouse name: Not on file  . Number of children: 2  . Years of education: Not on file  . Highest education level: Not on file  Occupational History  . Occupation: retired    Comment: worked in Academic librarian  . Smoking status: Never Smoker  . Smokeless tobacco: Never Used  Substance and Sexual Activity  . Alcohol use: Yes    Alcohol/week: 3.0 standard drinks    Types: 3 Standard drinks or equivalent per week    Comment: 3 nights out of the week  . Drug use: No  . Sexual activity: Not Currently  Other Topics  Concern  . Not on file  Social History Narrative  . Not on file   Social Determinants of Health   Financial Resource Strain: Low Risk   . Difficulty of Paying Living Expenses: Not very hard  Food Insecurity: No Food Insecurity  . Worried About Charity fundraiser in the Last Year: Never true  . Ran Out of Food in the Last Year: Never true  Transportation Needs: No Transportation Needs  . Lack of Transportation (Medical): No  . Lack of Transportation (Non-Medical): No  Physical Activity: Inactive  . Days of Exercise per Week: 0 days  . Minutes of Exercise per Session: 0 min  Stress: No Stress Concern Present  . Feeling of Stress : Only a little  Social Connections: Slightly Isolated  . Frequency of Communication with Friends and Family: More than three times a week  . Frequency of Social Gatherings with Friends and Family: More than three times a week  . Attends Religious Services: 1 to 4 times per year  . Active Member of Clubs or Organizations: Yes  . Attends Archivist Meetings: More than 4 times per year  . Marital Status: Widowed    Family History  Problem Relation Age of Onset  . Hypertension Mother   . Heart disease Mother   . Hypertension Father   . Heart disease Father   . Tremor Sister   . Dementia Brother   . Hypertension Daughter   . Colon cancer Neg Hx   . Esophageal cancer Neg Hx   . Rectal cancer Neg Hx   . Stomach cancer Neg Hx     Past Medical History:  Diagnosis Date  . Anxiety   . Chronic lower back pain   . Depression   . DJD (degenerative joint disease)    knees, neck  . GERD (gastroesophageal reflux disease)    has had dilitation in the past    Past Surgical History:  Procedure Laterality Date  . ABDOMINAL HYSTERECTOMY  03/1979  . BREAST BIOPSY Left 1986 and 1987  . CATARACT EXTRACTION, BILATERAL Bilateral 2014   Dr. Madelin Headings, in [place]     Current Outpatient Medications on File Prior to Visit  Medication Sig Dispense  Refill  . acetaminophen (TYLENOL) 500 MG tablet Take 500 mg by mouth every 6 (six) hours as needed.    Marland Kitchen apixaban (ELIQUIS) 5 MG TABS tablet Take 1 tablet (5 mg total) by mouth 2 (two) times daily. 60 tablet 1  . Ascorbic Acid (VITAMIN C) 500 MG CAPS Take by mouth.    Marland Kitchen  cefdinir (OMNICEF) 300 MG capsule Take 1 capsule (300 mg total) by mouth every 12 (twelve) hours. 6 capsule 1  . CHOLECALCIFEROL PO Take 5,000 Units by mouth daily.    . cyanocobalamin 1000 MCG tablet Take 1,000 mcg by mouth daily.    . cyclobenzaprine (FLEXERIL) 10 MG tablet Take 1/2 to 1 tablet    2 to 3 x /day if needed for Muscle Spasms 90 tablet 0  . diltiazem (CARDIZEM CD) 240 MG 24 hr capsule Take 1 capsule (240 mg total) by mouth daily. 30 capsule 1  . ferrous sulfate 325 (65 FE) MG tablet Take 1 tablet (325 mg total) by mouth daily with breakfast.  3  . lansoprazole (PREVACID) 30 MG capsule Take 1 capsule Daily for Heartburn & Indigestion 90 capsule 3  . metoprolol tartrate (LOPRESSOR) 25 MG tablet Take 50 mg( 2 Tablets )  in the Morning and Take 25 mg ( 1 Tablet)  in the Evening 90 tablet 11  . Multiple Vitamins-Minerals (ZINC PO) Take 1 tablet by mouth daily.    Marland Kitchen PARoxetine (PAXIL) 20 MG tablet Take 1 tablet Daily for Mood (Patient taking differently: Take 20 mg by mouth daily. Take 1 tablet Daily for Mood) 90 tablet 3  . pilocarpine (PILOCAR) 1 % ophthalmic solution 1 drop 2 (two) times daily.    . Probiotic Product (PROBIOTIC DAILY PO) Take 1 tablet by mouth daily.    . promethazine-dextromethorphan (PROMETHAZINE-DM) 6.25-15 MG/5ML syrup Take 1 to 2 tsp enery 4 hours if needed for cough 360 mL 1  . zinc gluconate 50 MG tablet Take 50 mg by mouth daily.     No current facility-administered medications on file prior to visit.    Allergies  Allergen Reactions  . Cephalosporins Rash    Physical exam:  Today's Vitals   05/09/19 0940  BP: 105/70  Pulse: 62  Temp: (!) 97.4 F (36.3 C)  Weight: 220 lb (99.8  kg)  Height: 5\' 10"  (1.778 m)   Body mass index is 31.57 kg/m.   Wt Readings from Last 3 Encounters:  05/09/19 220 lb (99.8 kg)  05/01/19 219 lb (99.3 kg)  04/24/19 221 lb 9.6 oz (100.5 kg)     Ht Readings from Last 3 Encounters:  05/09/19 5\' 10"  (1.778 m)  05/01/19 5\' 10"  (1.778 m)  04/24/19 5' 9.5" (1.765 m)      General: The patient is awake, alert and appears not in acute distress.  The patient is groomed. Head: Normocephalic, atraumatic. Neck is supple. Mallampati 3,  neck circumference:15. 25 inches . Nasal airflow patent.  Retrognathia is not seen.  Dental status: native  Cardiovascular:  irregular  rate and cardiac rhythm by pulse, but  without distended neck veins. Respiratory: Lungs are clear to auscultation.  Skin:  Without evidence of ankle edema (!), or rash. Trunk: The patient's posture is erect.   Neurologic exam : The patient is awake and alert, oriented to place and time.   Memory subjective described as intact.  Attention span & concentration ability appears normal.  Speech is fluent,  without  dysarthria, dysphonia or aphasia.  Mood and affect are appropriate.   Cranial nerves: no loss of smell or taste reported  Pupils are equal and briskly reactive to light. Funduscopic exam deferred.  Status post cataract/  Extraocular movements in vertical and horizontal planes were intact and without nystagmus. No Diplopia. Visual fields by finger perimetry are intact. Hearing was intact to soft voice and finger rubbing.  Facial sensation intact to fine touch.  Facial motor strength is symmetric and tongue and uvula move midline.  Neck ROM : rotation, tilt and flexion extension were normal for age and shoulder shrug was symmetrical.    Motor exam:  Symmetric bulk, tone and ROM.   Normal tone without cog wheeling, symmetric grip strength .   Sensory:  Fine touch, pinprick and vibration were tested  and  normal.  Proprioception tested in the upper extremities  was normal.   Coordination: Rapid alternating movements in the fingers/hands were of normal speed.  The Finger-to-nose maneuver was intact without evidence of ataxia, dysmetria or tremor.  Gait and station: Patient in wheelchair  Toe and heel walk were deferred.  Deep tendon reflexes: in the  upper and lower extremities are symmetric and intact.  Babinski response was deferred .   I have the pleasure of meeting Darlene [name]. Darlene [name] today a patient of Dr. Janit Pagan who saw her last on 24 April 2019 and a post hospital follow-up.  The patient had been admitted on 15 April 2019 and was discharged on 1 February.  She was hospitalized with new onset atrial fibrillation and given diltiazem for rate control she was then transitioned to oral diltiazem from IV.  Cardiac enzyme was unremarkable 2D echocardiogram was obtained and unremarkable.  Post discharge she was contacted because her urine culture grew E. coli and it was empirically treated as a UTI.  Patient has developed a morbilliform rash on the upper trunk that was probably related to the antibiotic until the antibiotic had to be discontinued she also has been found to have iron deficiency anemia she was noted to have lower nocturnal oxygen desaturation in hospital and an outpatient sleep study is therefore recommended.  The patient has been followed with hypertension, hyperlipidemia prediabetes, vitamin D deficiency now with iron deficiency anemia and atrial fibrillation labile hypertension orthostatic presyncope, dizziness that she does not have ankle edema.  Medications were reviewed she has started on Eliquis for anticoagulation she remains on Cardizem 2050 mg 24-hour release capsule she has been taking Flexeril as needed for muscle spasms but she can also use at night it will help her sleep,, she is on vitamin B12 1000 mcg tablet daily she discontinued ciprofloxacin, she is taking 5000 units vitamin D daily.  I would like to add that she has degenerative  joint disease and GERD and she has esophageal dilatation in the past to help with swallowing.  She has remained on Paxil which helps her greatly with depression.  She has remained on Lopressor metoprolol which also will lower her heart rate.   Her heart rate has been ongoingly irregular now nocturnal EKG tracings have shown lack of a P wave -therefore atrial fibrillation is still present.     After spending a total time of 46 minutes face to face and additional time for physical and neurologic examination, review of laboratory studies,  personal review of imaging studies, reports and results of other testing and review of referral information / records as far as provided in visit, I have established the following assessments:  1)  Orthostaic hypotension and near syncope.  Given Mrs. Gauthier's place of residence and her ambulatory difficulties with presyncope I would much prefer to do an at-home home sleep study to screen for apnea.   2) She was observed in hospital having low oxygen saturations, rapid ventricular response. AN  In- lab- PSG  study would give me more data that would certainly be helpful,  but  I think we know that the patient has atrial fibrillation therefore a home sleep test may be enough.   If the patient still has low oxygen on CPAP we may need an inpatient oxygen titration- or defer to pulmonology  We will use the watch pat app which allows me to also use an algorithm that estimates how much REM sleep is present.     My Plan is to proceed with:  1) HST  2) Anemia, related iron deficient, addressed by Dr Melford Aase, add O- juice to iron pill intake in AM.  She has developed consitpation, may benefif from prenatal Minerals.  3) start with daily gait exercises. Walker with seat recommended.  I would like to thank [name], [place],  [place] [address] for allowing me to meet with and to take care of this pleasant patient.    I plan to follow up  either personally or through our NP within 2-3  month.   CC: I will share my notes with PCP.  Electronically signed by: Larey Seat, MD 05/09/2019 9:48 AM  [facility] Board certified by The [employer] of Sleep Medicine and Diplomate of the [employer] of Sleep Medicine. Board certified In Neurology through the [employer], Fellow of the [employer] of Neurology. Medical Director of [facility].

## 2019-05-09 NOTE — Patient Outreach (Signed)
Telephone outreach.  Spoke with pt's daughter, [name], this evening. She reports her mother went to see the neurologist today and they went through the pre-sleep test work up. They were pleased with the visit and the office will advise when her sleep study appt will be.  No new problems. I will call again in one week.  THN CM Care Plan Problem One     Most Recent Value  Care Plan Problem One  New diagnosis: AFIB  Role Documenting the Problem One  Care Management Coordinator  Care Plan for Problem One  Active  THN Long Term Goal   Pt will be able to describe her health problem and the things she does to manage this by the end of 60 days.  THN Long Term Goal Start Date  04/25/19  Interventions for Problem One Long Term Goal  Encouraged to read her AFIB materials for discussion next week.  THN CM Short Term Goal #1   Pt will recieve Emmi educational materials and acknowledge she has read them and will review with NP within the next 30 days.  THN CM Short Term Goal #1 Start Date  04/25/19  Interventions for Short Term Goal #1  Encouraged to review and be ready to discuss next week.    THN CM Care Plan Problem Two     Most Recent Value  Care Plan Problem Two  No Advanced Directives  Role Documenting the Problem Two  Care Management Coordinator  Care Plan for Problem Two  Active  Interventions for Problem Two Long Term Goal   Reinforced to pt daughter the importance and reminded her that this is one of her care plan goals to complete.  THN Long Term Goal  Pt will complete her Advanced Directives within the next 60 days.  THN Long Term Goal Start Date  04/28/19     [name]. [name], MSN, [facility] Gerontological Nurse Practitioner [facility] Care Management [phone]

## 2019-05-11 ENCOUNTER — Encounter: Payer: Self-pay | Admitting: *Deleted

## 2019-05-11 ENCOUNTER — Other Ambulatory Visit: Payer: Self-pay | Admitting: *Deleted

## 2019-05-11 NOTE — Patient Outreach (Signed)
Daughter, [name], called to request assistance for her mother's Eliquis. Sent referral to our pharmacy team.  [name]. [name], MSN, [facility] Gerontological Nurse Practitioner [facility] Care Management [phone]

## 2019-05-12 ENCOUNTER — Telehealth: Payer: Self-pay | Admitting: Pharmacist

## 2019-05-13 NOTE — Patient Outreach (Signed)
[place] [facility]) Care Management  [place]   05/13/2019  SKYANNE KOSCIELSKI December 03, 1940 [id number]  Reason for referral: medication assistance  Referral source: [facility] RN Referral medication(s): Eliquis Current insurance: [employer]  HPI:  Spoke with Patient and her daughter, [name] via telephone. HIPAA identifiers were obtained. Patient is a 79 year old female with multiple medical conditions including but not limited to: Afib, Chronic back pain, depression, DJD, GERD, hyperlipidemia, iron deficiency, and obesity.  Patient's daughter [name]) manages her mother's medications and said her current copay of $48 is cost prohibitive for her.  Objective: Allergies  Allergen Reactions  . Cephalosporins Rash    Medications Reviewed Today    Reviewed by Elayne Guerin, [facility] (Pharmacist) on 05/12/19 at [place] List Status: <None>  Medication Order Taking? Sig Documenting Provider Last Dose Status Informant  acetaminophen (TYLENOL) 500 MG tablet [id number] Yes Take 500 mg by mouth every 6 (six) hours as needed. [provider] Taking Active Self  apixaban (ELIQUIS) 5 MG TABS tablet [id number] Yes Take 1 tablet (5 mg total) by mouth 2 (two) times daily. Orson Eva, MD Taking Active   Ascorbic Acid (VITAMIN C) 500 MG CAPS [id number] Yes Take by mouth. [provider] Taking Active Self       Patient not taking:      Discontinued 05/12/19 1553 (Completed Course)   CHOLECALCIFEROL PO [id number] Yes Take 5,000 Units by mouth daily. [provider] Taking Active Self           Med Note Grier Rocher Mar 31, 2018 10:59 AM) 7,000units  cyanocobalamin 1000 MCG tablet [id number] Yes Take 1,000 mcg by mouth daily. [provider] Taking Active Self  cyclobenzaprine (FLEXERIL) 10 MG tablet [id number] Yes Take 1/2 to 1 tablet    2 to 3 x /day if needed for Muscle Spasms [name], MD Taking Active   diltiazem (CARDIZEM CD) 240 MG 24  hr capsule [id number] Yes Take 1 capsule (240 mg total) by mouth daily. Orson Eva, MD Taking Active   ferrous sulfate 325 (65 FE) MG tablet [id number]  Take 1 tablet (325 mg total) by mouth daily with breakfast. Orson Eva, MD  Active   lansoprazole (PREVACID) 30 MG capsule [id number] Yes Take 1 capsule Daily for Heartburn & Indigestion [name], MD Taking Active   metoprolol tartrate (LOPRESSOR) 25 MG tablet [id number] Yes Take 50 mg( 2 Tablets )  in the Morning and Take 25 mg ( 1 Tablet)  in the Evening Imogene Burn, PA-C Taking Active   Multiple Vitamins-Minerals (ZINC PO) [id number] No Take 1 tablet by mouth daily. [provider] Not Taking Active Self  PARoxetine (PAXIL) 20 MG tablet [id number] Yes Take 1 tablet Daily for Mood  Patient taking differently: Take 20 mg by mouth daily. Take 1 tablet Daily for Mood   [name], MD Taking Active Self  pilocarpine (PILOCAR) 1 % ophthalmic solution [id number] Yes 1 drop 2 (two) times daily. [provider] Taking Active Self  Probiotic Product (PROBIOTIC DAILY PO) [id number] Yes Take 1 tablet by mouth daily. [provider] Taking Active   promethazine-dextromethorphan (PROMETHAZINE-DM) 6.25-15 MG/5ML syrup [id number] Yes Take 1 to 2 tsp enery 4 hours if needed for cough [name], MD Taking Active Self           Med Note Elayne Guerin   Fri May 12, 2019  3:55 PM) PRN  zinc gluconate 50 MG tablet  [id number] Yes Take 50 mg by mouth daily. [provider] Taking Active           Assessment:  Drugs sorted by system:  Neurologic/Psychologic: Paroxetine  Cardiovascular: Eliquis, Diltiazem, Metoprolol   Pulmonary/Allergy: Promethazine-Dextromethorphan  Gastrointestinal: Lansoprazole, Probiotic  Pain: Acetaminophen, Cyclobenzaprine  Vitamins/Minerals/Supplements: Ascorbic Acid, Cholecalciferol, Cyanocobalamin, Ferrous Sulfate,  Zinc,   Miscellaneous: Pilocarpine Eye  Drops  Medication Assistance Findings:  Medication assistance needs identified: Eliquis   Patient appears to be eligible to receive Eliquis through [place] Patient Assistance Program from a financial perspective. However, it is unclear and doubtful if she has spent their required out-of-pocket medication expense amount of 3% of household income.  A great deal of time was spent educating Patient and Daughter about Medicare Part D, the donut hole, and strategies to decrease drug costs ( use insurance mail order, 90 day supplies.Marland Kitchenetc)  Patient gets some of her medications at a local pharmacy and some of them through [employer].  As such, the source of truth and proof her pharmacy spending will be the EOB's sent to her by [facility].  Based on patient's reported income, 3% would be around $505.  Patient's daughter took down my contact information and said she would call me back when her mother got close to $505 in medication expenses so we can start the patient assistance process.  Our experience has been if we mail patients applications before they meet the criteria, they lose them by the time they are ready to apply and the applications have to be resent.  Plan: Close patient's case for now as her daughter has my contact information and communicated understanding of the process and can reach to me with questions at any time.   Elayne Guerin, PharmD, [place] Clinical Pharmacist [phone]       Additional medication assistance options reviewed with patient as warranted:  No other options identified  Plan: I will route patient assistance letter to [place] technician who will coordinate patient assistance program application process for medications listed above.  [facility], The pharmacy technician will assist with obtaining all required documents from both patient and provider(s) and submit application(s) once completed.     Elayne Guerin, PharmD, [place]  Clinical Pharmacist [phone]

## 2019-05-16 ENCOUNTER — Other Ambulatory Visit: Payer: Self-pay | Admitting: *Deleted

## 2019-05-16 NOTE — Patient Outreach (Signed)
Telephone outreach.  Spoke with Darlene [name] this morning and then her mother. Darlene [name] reports her mother is fair. She is not bouncing back to baseline. She is very fatigued and weak. She is taking her new medications as prescribed (apixiban 5 mg bid, diltiazem CD 240 daily, metoprolol 50 mg in am and 25 mg in pm.  Darlene [name] will be getting a rolling walker with a seat for her mother which we discussed last week. This option will provide someplace for her to sit if she becomes too fatigued or gets dizzy.  Darlene [name] asked, "what heart rate is too high." She says she has seen 140 on the pulse oximeter.   Talked with Darlene [name] and she gives basic same report as her daughter. Feeling fatigued, unsteady.  They both report they had a discussion about making a HCPOA and living will.   PLAN: Discussed when to report a problem to the heart doctor: If you have palpitations that don't stop, if you have on going faint/dizzy spells, SOB, CP. If the sxs are severe call 911, if only mild do report to your cardiologist. Discussed that the reading on the pulse oximeter will likely vary widely when in AFIB. It is the sxs that should be reported.  Encouraged them to now fill out the advanced directives except the signature page and take to the bank for the signature to be notarized.  I will call again in one week.  [name]. [name], MSN, [facility] Gerontological Nurse Practitioner [facility] Care Management [phone]

## 2019-05-24 ENCOUNTER — Ambulatory Visit (INDEPENDENT_AMBULATORY_CARE_PROVIDER_SITE_OTHER): Payer: Medicare Other | Admitting: Neurology

## 2019-05-24 DIAGNOSIS — G4733 Obstructive sleep apnea (adult) (pediatric): Secondary | ICD-10-CM

## 2019-05-24 DIAGNOSIS — R0902 Hypoxemia: Secondary | ICD-10-CM

## 2019-05-24 DIAGNOSIS — R0683 Snoring: Secondary | ICD-10-CM

## 2019-05-24 DIAGNOSIS — D509 Iron deficiency anemia, unspecified: Secondary | ICD-10-CM

## 2019-05-24 DIAGNOSIS — I4891 Unspecified atrial fibrillation: Secondary | ICD-10-CM

## 2019-05-25 ENCOUNTER — Encounter: Payer: Self-pay | Admitting: Cardiovascular Disease

## 2019-05-25 ENCOUNTER — Ambulatory Visit (INDEPENDENT_AMBULATORY_CARE_PROVIDER_SITE_OTHER): Payer: Medicare Other | Admitting: Cardiovascular Disease

## 2019-05-25 ENCOUNTER — Ambulatory Visit (INDEPENDENT_AMBULATORY_CARE_PROVIDER_SITE_OTHER): Payer: Medicare Other | Admitting: Internal Medicine

## 2019-05-25 ENCOUNTER — Other Ambulatory Visit: Payer: Self-pay

## 2019-05-25 ENCOUNTER — Other Ambulatory Visit: Payer: Self-pay | Admitting: *Deleted

## 2019-05-25 VITALS — BP 116/78 | HR 76 | Temp 96.9°F | Resp 16 | Ht 70.0 in | Wt 226.6 lb

## 2019-05-25 VITALS — BP 110/80 | HR 72 | Temp 96.9°F | Ht 70.0 in | Wt 226.0 lb

## 2019-05-25 DIAGNOSIS — I10 Essential (primary) hypertension: Secondary | ICD-10-CM

## 2019-05-25 DIAGNOSIS — I4819 Other persistent atrial fibrillation: Secondary | ICD-10-CM | POA: Diagnosis not present

## 2019-05-25 DIAGNOSIS — R0989 Other specified symptoms and signs involving the circulatory and respiratory systems: Secondary | ICD-10-CM

## 2019-05-25 DIAGNOSIS — I272 Pulmonary hypertension, unspecified: Secondary | ICD-10-CM | POA: Diagnosis not present

## 2019-05-25 DIAGNOSIS — E669 Obesity, unspecified: Secondary | ICD-10-CM

## 2019-05-25 DIAGNOSIS — B9620 Unspecified Escherichia coli [E. coli] as the cause of diseases classified elsewhere: Secondary | ICD-10-CM | POA: Diagnosis not present

## 2019-05-25 DIAGNOSIS — Z79899 Other long term (current) drug therapy: Secondary | ICD-10-CM

## 2019-05-25 DIAGNOSIS — N390 Urinary tract infection, site not specified: Secondary | ICD-10-CM

## 2019-05-25 DIAGNOSIS — E782 Mixed hyperlipidemia: Secondary | ICD-10-CM

## 2019-05-25 DIAGNOSIS — E7439 Other disorders of intestinal carbohydrate absorption: Secondary | ICD-10-CM

## 2019-05-25 DIAGNOSIS — R8290 Unspecified abnormal findings in urine: Secondary | ICD-10-CM | POA: Diagnosis not present

## 2019-05-25 DIAGNOSIS — E559 Vitamin D deficiency, unspecified: Secondary | ICD-10-CM

## 2019-05-25 MED ORDER — APIXABAN 5 MG PO TABS: 5.0000 mg | ORAL_TABLET | Freq: Two times a day (BID) | ORAL | 3 refills | Status: DC

## 2019-05-25 NOTE — Progress Notes (Signed)
History of Present Illness:       This very nice 79 y.o. mWWF presents for 3 month follow up with HTN, HLD, Pre-Diabetes and Vitamin D Deficiency.       Patient is treated for HTN & BP has been controlled at home. Today's BP is at goal - 116/78. Patient has had no complaints of any cardiac type chest pain, palpitations, dyspnea / orthopnea / PND, dizziness, claudication, or dependent edema.      Hyperlipidemia is controlled with diet & meds. Patient denies myalgias or other med SE's. Last Lipids were not at goal:  Lab Results  Component Value Date   CHOL 162 04/16/2019   HDL 32 (L) 04/16/2019   LDLCALC 110 (H) 04/16/2019   TRIG 100 04/16/2019   CHOLHDL 5.1 04/16/2019    Also, the patient has history of PreDiabetes and has had no symptoms of reactive hypoglycemia, diabetic polys, paresthesias or visual blurring.  Last A1c was Normal at goal:  Lab Results  Component Value Date   HGBA1C 5.6 04/16/2019       Further, the patient also has history of Vitamin D Deficiency and supplements vitamin D without any suspected side-effects. Last vitamin D was at goal:  Lab Results  Component Value Date   VD25OH 61 09/01/2018    Current Outpatient Medications on File Prior to Visit  Medication Sig  . acetaminophen (TYLENOL) 500 MG tablet Take 500 mg by mouth every 6 (six) hours as needed.  . Ascorbic Acid (VITAMIN C) 500 MG CAPS Take by mouth.  . CHOLECALCIFEROL PO Take 5,000 Units by mouth daily.  . cyanocobalamin 1000 MCG tablet Take 1,000 mcg by mouth daily.  . cyclobenzaprine (FLEXERIL) 10 MG tablet Take 1/2 to 1 tablet    2 to 3 x /day if needed for Muscle Spasms  . diltiazem (CARDIZEM CD) 240 MG 24 hr capsule Take 1 capsule (240 mg total) by mouth daily.  . ferrous sulfate 325 (65 FE) MG tablet Take 1 tablet (325 mg total) by mouth daily with breakfast.  . lansoprazole (PREVACID) 30 MG capsule Take 1 capsule Daily for Heartburn & Indigestion  . metoprolol tartrate (LOPRESSOR)  25 MG tablet Take 50 mg( 2 Tablets )  in the Morning and Take 25 mg ( 1 Tablet)  in the Evening  . PARoxetine (PAXIL) 20 MG tablet Take 1 tablet Daily for Mood (Patient taking differently: Take 20 mg by mouth daily. Take 1 tablet Daily for Mood)  . pilocarpine (PILOCAR) 1 % ophthalmic solution 1 drop 2 (two) times daily.  . Probiotic Product (PROBIOTIC DAILY PO) Take 1 tablet by mouth daily.  . promethazine-dextromethorphan (PROMETHAZINE-DM) 6.25-15 MG/5ML syrup Take 1 to 2 tsp enery 4 hours if needed for cough  . zinc gluconate 50 MG tablet Take 50 mg by mouth daily.   No current facility-administered medications on file prior to visit.    Allergies  Allergen Reactions  . Cephalosporins Rash    PMHx:   Past Medical History:  Diagnosis Date  . Anxiety   . Chronic lower back pain   . Depression   . DJD (degenerative joint disease)    knees, neck  . GERD (gastroesophageal reflux disease)    has had dilitation in the past    Immunization History  Administered Date(s) Administered  . Influenza, High Dose Seasonal PF 01/29/2018  . Influenza-Unspecified 12/09/2016, 11/28/2018  . Moderna SARS-COVID-2 Vaccination 03/28/2019  . Pneumococcal Conjugate-13 02/12/2016, 09/30/2017  . Pneumococcal  Polysaccharide-23 03/16/2006, 04/02/2008  . Zoster 07/31/2013    Past Surgical History:  Procedure Laterality Date  . ABDOMINAL HYSTERECTOMY  03/1979  . BREAST BIOPSY Left 1986 and 1987  . CATARACT EXTRACTION, BILATERAL Bilateral 2014   Dr. Madelin Headings, in [place]    FHx:    Reviewed / unchanged  SHx:    Reviewed / unchanged   Systems Review:  Constitutional: Denies fever, chills, wt changes, headaches, insomnia, fatigue, night sweats, change in appetite. Eyes: Denies redness, blurred vision, diplopia, discharge, itchy, watery eyes.  ENT: Denies discharge, congestion, post nasal drip, epistaxis, sore throat, earache, hearing loss, dental pain, tinnitus, vertigo, sinus pain, snoring.    CV: Denies chest pain, palpitations, irregular heartbeat, syncope, dyspnea, diaphoresis, orthopnea, PND, claudication or edema. Respiratory: denies cough, dyspnea, DOE, pleurisy, hoarseness, laryngitis, wheezing.  Gastrointestinal: Denies dysphagia, odynophagia, heartburn, reflux, water brash, abdominal pain or cramps, nausea, vomiting, bloating, diarrhea, constipation, hematemesis, melena, hematochezia  or hemorrhoids. Genitourinary: Denies dysuria, frequency, urgency, nocturia, hesitancy, discharge, hematuria or flank pain. Musculoskeletal: Denies arthralgias, myalgias, stiffness, jt. swelling, pain, limping or strain/sprain.  Skin: Denies pruritus, rash, hives, warts, acne, eczema or change in skin lesion(s). Neuro: No weakness, tremor, incoordination, spasms, paresthesia or pain. Psychiatric: Denies confusion, memory loss or sensory loss. Endo: Denies change in weight, skin or hair change.  Heme/Lymph: No excessive bleeding, bruising or enlarged lymph nodes.  Physical Exam  BP 116/78   Pulse 76   Temp (!) 96.9 F (36.1 C)   Resp 16   Ht 5\' 10"  (1.778 m)   Wt 226 lb 9.6 oz (102.8 kg)   BMI 32.51 kg/m   Appears  well nourished, well groomed  and in no distress.  Eyes: PERRLA, EOMs, conjunctiva no swelling or erythema. Sinuses: No frontal/maxillary tenderness ENT/Mouth: EAC's clear, TM's nl w/o erythema, bulging. Nares clear w/o erythema, swelling, exudates. Oropharynx clear without erythema or exudates. Oral hygiene is good. Tongue normal, non obstructing. Hearing intact.  Neck: Supple. Thyroid not palpable. Car 2+/2+ without bruits, nodes or JVD. Chest: Respirations nl with BS clear & equal w/o rales, rhonchi, wheezing or stridor.  Cor: Heart sounds normal w/ regular rate and rhythm without sig. murmurs, gallops, clicks or rubs. Peripheral pulses normal and equal  without edema.  Abdomen: Soft & bowel sounds normal. Non-tender w/o guarding, rebound, hernias, masses or  organomegaly.  Lymphatics: Unremarkable.  Musculoskeletal: Full ROM all peripheral extremities, joint stability, 5/5 strength and normal gait.  Skin: Warm, dry without exposed rashes, lesions or ecchymosis apparent.  Neuro: Cranial nerves intact, reflexes equal bilaterally. Sensory-motor testing grossly intact. Tendon reflexes grossly intact.  Pysch: Alert & oriented x 3.  Insight and judgement nl & appropriate. No ideations.  Assessment and Plan:  1. Labile hypertension  - Continue medication, monitor blood pressure at home.  - Continue DASH diet.  Reminder to go to the ER if any CP,  SOB, nausea, dizziness, severe HA, changes vision/speech.  - CBC with Differential/Platelet - COMPLETE METABOLIC PANEL WITH GFR - Magnesium - TSH  2. Persistent atrial fibrillation ([place])  3. Hyperlipidemia, mixed  - Continue diet/meds, exercise,& lifestyle modifications.  - Continue monitor periodic cholesterol/liver & renal functions   - Lipid panel - TSH  4. Glucose intolerance  - Continue diet, exercise  - Lifestyle modifications.  - Monitor appropriate labs.  - Hemoglobin A1c - Insulin, random  5. E-coli UTI  - Urinalysis, Routine w reflex microscopic - Urine Culture  6. Vitamin D deficiency  - VITAMIN D  25 Hydroxy - Continue supplementation.  7. Medication management  - CBC with Differential/Platelet - COMPLETE METABOLIC PANEL WITH GFR - Magnesium - Lipid panel - TSH - Hemoglobin A1c - Insulin, random - VITAMIN D 25 Hydroxy          Discussed  regular exercise, BP monitoring, weight control to achieve/maintain BMI less than 25 and discussed med and SE's. Recommended labs to assess and monitor clinical status with further disposition pending results of labs.  I discussed the assessment and treatment plan with the patient. The patient was provided an opportunity to ask questions and all were answered. The patient agreed with the plan and demonstrated an understanding of  the instructions.  I provided over 30 minutes of exam, counseling, chart review and  complex critical decision making.         The patient was advised to call back or seek an in-person evaluation if the symptoms worsen or if the condition fails to improve as anticipated.   Kirtland Bouchard, MD

## 2019-05-25 NOTE — Patient Instructions (Signed)

## 2019-05-25 NOTE — Progress Notes (Signed)
SUBJECTIVE: The patient presents for follow-up of atrial fibrillation.  I initially evaluated her on 04/17/2019 when she was hospitalized for UTI and found to be in rapid atrial fibrillation.  She has severe left atrial enlargement and thus I am pursuing a strategy of rate control and anticoagulation.  She also has pulmonary hypertension.  She was evaluated in our office after being hospitalized on 05/01/2019.  Metoprolol dose was increased.  She was evaluated by neurology on 05/09/2019 with plans to undergo a home sleep study to screen for apnea.  She is here with her daughter, Jolaine Artist.  The patient is feeling much better.  She continues to have exertional dyspnea and she experiences weakness if she stands up too quickly.  I reviewed [name]'s phone which had ECGs and blood pressure and heart rate log.  She has remained in atrial fibrillation.  Heart rates have been reasonably well controlled and blood pressures have been normal.    Review of Systems: As per "subjective", otherwise negative.  Allergies  Allergen Reactions  . Cephalosporins Rash    Current Outpatient Medications  Medication Sig Dispense Refill  . acetaminophen (TYLENOL) 500 MG tablet Take 500 mg by mouth every 6 (six) hours as needed.    Marland Kitchen apixaban (ELIQUIS) 5 MG TABS tablet Take 1 tablet (5 mg total) by mouth 2 (two) times daily. 60 tablet 1  . Ascorbic Acid (VITAMIN C) 500 MG CAPS Take by mouth.    . CHOLECALCIFEROL PO Take 5,000 Units by mouth daily.    . cyanocobalamin 1000 MCG tablet Take 1,000 mcg by mouth daily.    . cyclobenzaprine (FLEXERIL) 10 MG tablet Take 1/2 to 1 tablet    2 to 3 x /day if needed for Muscle Spasms 90 tablet 0  . diltiazem (CARDIZEM CD) 240 MG 24 hr capsule Take 1 capsule (240 mg total) by mouth daily. 30 capsule 1  . ferrous sulfate 325 (65 FE) MG tablet Take 1 tablet (325 mg total) by mouth daily with breakfast.  3  . lansoprazole (PREVACID) 30 MG capsule Take 1 capsule  Daily for Heartburn & Indigestion 90 capsule 3  . metoprolol tartrate (LOPRESSOR) 25 MG tablet Take 50 mg( 2 Tablets )  in the Morning and Take 25 mg ( 1 Tablet)  in the Evening 90 tablet 11  . PARoxetine (PAXIL) 20 MG tablet Take 1 tablet Daily for Mood (Patient taking differently: Take 20 mg by mouth daily. Take 1 tablet Daily for Mood) 90 tablet 3  . pilocarpine (PILOCAR) 1 % ophthalmic solution 1 drop 2 (two) times daily.    . Probiotic Product (PROBIOTIC DAILY PO) Take 1 tablet by mouth daily.    . promethazine-dextromethorphan (PROMETHAZINE-DM) 6.25-15 MG/5ML syrup Take 1 to 2 tsp enery 4 hours if needed for cough 360 mL 1  . zinc gluconate 50 MG tablet Take 50 mg by mouth daily.     No current facility-administered medications for this visit.    Past Medical History:  Diagnosis Date  . Anxiety   . Chronic lower back pain   . Depression   . DJD (degenerative joint disease)    knees, neck  . GERD (gastroesophageal reflux disease)    has had dilitation in the past    Past Surgical History:  Procedure Laterality Date  . ABDOMINAL HYSTERECTOMY  03/1979  . BREAST BIOPSY Left 1986 and 1987  . CATARACT EXTRACTION, BILATERAL Bilateral 2014   Dr. Madelin Headings, in [place]  Social History   Socioeconomic History  . Marital status: Widowed    Spouse name: Not on file  . Number of children: 2  . Years of education: Not on file  . Highest education level: Not on file  Occupational History  . Occupation: retired    Comment: worked in Academic librarian  . Smoking status: Never Smoker  . Smokeless tobacco: Never Used  Substance and Sexual Activity  . Alcohol use: Yes    Alcohol/week: 3.0 standard drinks    Types: 3 Standard drinks or equivalent per week    Comment: 3 nights out of the week  . Drug use: No  . Sexual activity: Not Currently  Other Topics Concern  . Not on file  Social History Narrative  . Not on file   Social Determinants of Health   Financial  Resource Strain: Low Risk   . Difficulty of Paying Living Expenses: Not very hard  Food Insecurity: No Food Insecurity  . Worried About Charity fundraiser in the Last Year: Never true  . Ran Out of Food in the Last Year: Never true  Transportation Needs: No Transportation Needs  . Lack of Transportation (Medical): No  . Lack of Transportation (Non-Medical): No  Physical Activity: Inactive  . Days of Exercise per Week: 0 days  . Minutes of Exercise per Session: 0 min  Stress: No Stress Concern Present  . Feeling of Stress : Only a little  Social Connections: Slightly Isolated  . Frequency of Communication with Friends and Family: More than three times a week  . Frequency of Social Gatherings with Friends and Family: More than three times a week  . Attends Religious Services: 1 to 4 times per year  . Active Member of Clubs or Organizations: Yes  . Attends Archivist Meetings: More than 4 times per year  . Marital Status: Widowed  Intimate Partner Violence: Not At Risk  . Fear of Current or Ex-Partner: No  . Emotionally Abused: No  . Physically Abused: No  . Sexually Abused: No    Barbarann Ehlers, RN was present throughout the entirety of the encounter.  Vitals:   05/25/19 0911  BP: 110/80  Pulse: 72  Temp: (!) 96.9 F (36.1 C)  SpO2: 96%  Height: 5\' 10"  (1.778 m)    Wt Readings from Last 3 Encounters:  05/09/19 220 lb (99.8 kg)  05/01/19 219 lb (99.3 kg)  04/24/19 221 lb 9.6 oz (100.5 kg)     PHYSICAL EXAM General: NAD HEENT: Normal. Neck: No JVD, no thyromegaly. Lungs: Clear to auscultation bilaterally with normal respiratory effort. CV: Regular rate and irregular rhythm, normal S1/S2, no S3, no murmur. No pretibial or periankle edema.  No carotid bruit.   Abdomen: Soft, nontender, no distention.  Neurologic: Alert and oriented.  Psych: Normal affect. Skin: Normal. Musculoskeletal: No gross deformities.      Labs: Lab Results  Component  Value Date/Time   K 4.7 04/24/2019 03:47 PM   BUN 17 04/24/2019 03:47 PM   CREATININE 0.78 04/24/2019 03:47 PM   ALT 47 (H) 04/24/2019 03:47 PM   TSH 2.42 04/24/2019 03:47 PM   HGB 11.4 (L) 04/24/2019 03:47 PM     Lipids: Lab Results  Component Value Date/Time   LDLCALC 110 (H) 04/16/2019 04:35 AM   LDLCALC 122 (H) 04/03/2019 12:11 PM   CHOL 162 04/16/2019 04:35 AM   TRIG 100 04/16/2019 04:35 AM   HDL 32 (L) 04/16/2019 04:35 AM  Relevant CV Studies: Echo 04/16/19 IMPRESSIONS    1. Left ventricular ejection fraction, by visual estimation, is 55 to  60%. The left ventricle has normal function. There is mildly increased  left ventricular hypertrophy.  2. Left ventricular diastolic parameters are indeterminate.  3. The left ventricle has no regional wall motion abnormalities.  4. Global right ventricle has normal systolic function.The right  ventricular size is moderately enlarged. No increase in right ventricular  wall thickness.  5. Left atrial size was severely dilated.  6. Right atrial size was moderately dilated.  7. The mitral valve is normal in structure. Mild mitral valve  regurgitation. No evidence of mitral stenosis.  8. The tricuspid valve is normal in structure.  9. The tricuspid valve is normal in structure. Tricuspid valve  regurgitation is mild.  10. The aortic valve is tricuspid. Aortic valve regurgitation is not  visualized. No evidence of aortic valve sclerosis or stenosis.  11. The pulmonic valve was not well visualized. Pulmonic valve  regurgitation is not visualized.  12. Moderately elevated pulmonary artery systolic pressure.  13. The inferior vena cava is dilated in size with <50% respiratory  variability, suggesting right atrial pressure of 15 mmHg.    ASSESSMENT AND PLAN:  1.  Persistent atrial fibrillation: Given severe left atrial enlargement, she is unlikely to maintain sinus rhythm and thus I am pursuing a rate control and  anticoagulation strategy.  Symptomatically stable.  Continue Eliquis 5 mg twice daily (I will provide prescription).  Continue Cardizem CD 240 mg daily and Lopressor 50 mg every morning and 25 mg every evening.  2.  Pulmonary hypertension: A home sleep study has been arranged by neurology to screen for sleep apnea.  They are turning it in today.  The right ventricle was moderately enlarged with moderately elevated pulmonary pressures by echocardiogram on 04/16/2019.  She smoked for a short while in her early 33s.  There is no family history of pulmonary emboli.  I will obtain pulmonary function testing.  3.  Hypertension: Controlled on present therapy.  No changes.  4.  Obesity: She needs significant weight loss which would benefit her overall health..   Disposition: Follow up 6 months   Kate Sable, M.D., F.A.C.C.

## 2019-05-25 NOTE — Patient Instructions (Addendum)
Medication Instructions:  Your physician recommends that you continue on your current medications as directed. Please refer to the Current Medication list given to you today.  *If you need a refill on your cardiac medications before your next appointment, please call your pharmacy*   Lab Work: None  If you have labs (blood work) drawn today and your tests are completely normal, you will receive your results only by: Marland Kitchen MyChart Message (if you have MyChart) OR . A paper copy in the mail If you have any lab test that is abnormal or we need to change your treatment, we will call you to review the results.   Testing/Procedures: Your physician has recommended that you have a pulmonary function test. Pulmonary Function Tests are a group of tests that measure how well air moves in and out of your lungs.     Follow-Up: At [facility], you and your health needs are our priority.  As part of our continuing mission to provide you with exceptional heart care, we have created designated Provider Care Teams.  These Care Teams include your primary Cardiologist (physician) and Advanced Practice Providers (APPs -  Physician Assistants and Nurse Practitioners) who all work together to provide you with the care you need, when you need it.  We recommend signing up for the patient portal called "MyChart".  Sign up information is provided on this After Visit Summary.  MyChart is used to connect with patients for Virtual Visits (Telemedicine).  Patients are able to view lab/test results, encounter notes, upcoming appointments, etc.  Non-urgent messages can be sent to your provider as well.   To learn more about what you can do with MyChart, go to [email or web address].    Your next appointment:   6 month(s)  The format for your next appointment:   In Person  Provider:  Dr.Koneswaran    Other Instructions  You will need a COVID test before the Pulmonary Function Test         Thank you for  choosing [employer] !

## 2019-05-26 LAB — COMPLETE METABOLIC PANEL WITH GFR
AG Ratio: 2.2 (calc) (ref 1.0–2.5)
ALT: 16 U/L (ref 6–29)
AST: 15 U/L (ref 10–35)
Albumin: 3.8 g/dL (ref 3.6–5.1)
Alkaline phosphatase (APISO): 55 U/L (ref 37–153)
BUN/Creatinine Ratio: 19 (calc) (ref 6–22)
BUN: 20 mg/dL (ref 7–25)
CO2: 30 mmol/L (ref 20–32)
Calcium: 9.8 mg/dL (ref 8.6–10.4)
Chloride: 107 mmol/L (ref 98–110)
Creat: 1.08 mg/dL — ABNORMAL HIGH (ref 0.60–0.93)
GFR, Est African American: 57 mL/min/{1.73_m2} — ABNORMAL LOW (ref >=60)
GFR, Est Non African American: 49 mL/min/{1.73_m2} — ABNORMAL LOW (ref >=60)
Globulin: 1.7 g/dL (calc) — ABNORMAL LOW (ref 1.9–3.7)
Glucose, Bld: 98 mg/dL (ref 65–99)
Potassium: 4.1 mmol/L (ref 3.5–5.3)
Sodium: 145 mmol/L (ref 135–146)
Total Bilirubin: 0.3 mg/dL (ref 0.2–1.2)
Total Protein: 5.5 g/dL — ABNORMAL LOW (ref 6.1–8.1)

## 2019-05-26 LAB — URINALYSIS, ROUTINE W REFLEX MICROSCOPIC
Bacteria, UA: NONE SEEN /HPF
Bilirubin Urine: NEGATIVE
Glucose, UA: NEGATIVE
Hgb urine dipstick: NEGATIVE
Leukocytes,Ua: NEGATIVE
Nitrite: NEGATIVE
Specific Gravity, Urine: 1.031 (ref 1.001–1.03)
pH: <=5 (ref 5.0–8.0)

## 2019-05-26 LAB — HEMOGLOBIN A1C
Hgb A1c MFr Bld: 5.8 % of total Hgb — ABNORMAL HIGH (ref <5.7)
Mean Plasma Glucose: 120 (calc)
eAG (mmol/L): 6.6 (calc)

## 2019-05-26 LAB — TSH: TSH: 2.96 mIU/L (ref 0.40–4.50)

## 2019-05-26 LAB — CBC WITH DIFFERENTIAL/PLATELET
Absolute Monocytes: 638 cells/uL (ref 200–950)
Basophils Absolute: 17 cells/uL (ref 0–200)
Basophils Relative: 0.3 %
Eosinophils Absolute: 81 cells/uL (ref 15–500)
Eosinophils Relative: 1.4 %
HCT: 36.2 % (ref 35.0–45.0)
Hemoglobin: 12.2 g/dL (ref 11.7–15.5)
Lymphs Abs: 1676 cells/uL (ref 850–3900)
MCH: 30.7 pg (ref 27.0–33.0)
MCHC: 33.7 g/dL (ref 32.0–36.0)
MCV: 91 fL (ref 80.0–100.0)
MPV: 11.7 fL (ref 7.5–12.5)
Monocytes Relative: 11 %
Neutro Abs: 3387 cells/uL (ref 1500–7800)
Neutrophils Relative %: 58.4 %
Platelets: 256 10*3/uL (ref 140–400)
RBC: 3.98 10*6/uL (ref 3.80–5.10)
RDW: 17.5 % — ABNORMAL HIGH (ref 11.0–15.0)
Total Lymphocyte: 28.9 %
WBC: 5.8 10*3/uL (ref 3.8–10.8)

## 2019-05-26 LAB — VITAMIN D 25 HYDROXY (VIT D DEFICIENCY, FRACTURES): Vit D, 25-Hydroxy: 69 ng/mL (ref 30–100)

## 2019-05-26 LAB — LIPID PANEL
Cholesterol: 178 mg/dL (ref <200)
HDL: 60 mg/dL (ref >=50)
LDL Cholesterol (Calc): 98 mg/dL (calc)
Non-HDL Cholesterol (Calc): 118 mg/dL (calc) (ref <130)
Total CHOL/HDL Ratio: 3 (calc) (ref <5.0)
Triglycerides: 100 mg/dL (ref <150)

## 2019-05-26 LAB — URINE CULTURE
MICRO NUMBER:: 10241401
SPECIMEN QUALITY:: ADEQUATE

## 2019-05-26 LAB — INSULIN, RANDOM: Insulin: 27 u[IU]/mL — ABNORMAL HIGH

## 2019-05-26 LAB — MAGNESIUM: Magnesium: 1.9 mg/dL (ref 1.5–2.5)

## 2019-05-28 ENCOUNTER — Encounter: Payer: Self-pay | Admitting: Internal Medicine

## 2019-05-31 ENCOUNTER — Telehealth: Payer: Self-pay | Admitting: Cardiovascular Disease

## 2019-05-31 ENCOUNTER — Other Ambulatory Visit: Payer: Self-pay | Admitting: *Deleted

## 2019-05-31 DIAGNOSIS — G4733 Obstructive sleep apnea (adult) (pediatric): Secondary | ICD-10-CM | POA: Insufficient documentation

## 2019-05-31 NOTE — Patient Outreach (Signed)
Telephone assessment.  Talked with Mrs. Jeanjacques and her daughter on speaker phone today. Mrs. Debarros says she is feeling better little by little. She has seen her cardiologist since we last spoke (no changes in her regimen), her primary care provider and had labs done which were normal or improving and she had her sleep study. She has not been given the results of this.  [facility] CM Care Plan Problem One     Most Recent Value  Care Plan Problem One  New diagnosis: AFIB  Role Documenting the Problem One  Care Management Coordinator  Care Plan for Problem One  Active  [facility] Long Term Goal   Pt will be able to describe her health problem and the things she does to manage this by the end of 60 days.  [facility] Long Term Goal Start Date  04/25/19  [facility] CM Short Term Goal #1   Pt will recieve Emmi educational materials and acknowledge she has read them and will review with NP within the next 30 days.  [facility] CM Short Term Goal #1 Start Date  04/25/19    [facility] CM Care Plan Problem Two     Most Recent Value  Care Plan Problem Two  No Advanced Directives  Role Documenting the Problem Two  Care Management Coordinator  Care Plan for Problem Two  Active  [facility] Long Term Goal  Pt will complete her Advanced Directives within the next 60 days.  [facility] Long Term Goal Start Date  04/28/19     I will call again in 2 weeks.  [name]. [name], MSN, [facility] Gerontological Nurse Practitioner [facility] Care Management [phone]

## 2019-05-31 NOTE — Procedures (Signed)
  Patient Information     First Name: Kamelia Last Name: Krymson Jakobsen: [id number]  Birth Date: [birth year] Age: 79 Gender: Female  Referring Provider: [name], MD BMI: 31.6 (W=220 lb, H=5' 10'')  Neck Circ.:  15 '' Epworth:  12/24   Sleep Study Information    Study Date: May 24, 2019 S/H/A Version: 001.001.001.001 / 4.1.1528 / 23  History:    Darlene [name] is a right -handed female with a possible sleep disorder.  She has a has a past medical history of Chronic lower back pain, Depression, DJD (degenerative joint disease), and GERD (gastroesophageal reflux disease), atrial fibrillation- newly diagnosed in 04-2019 and during hospitalization was noted to be hypoxic, apnoeic and anemic. She is referred by her PCP.       Summary & Diagnosis:    Severe Obstructive sleep Apnea with an AHI of 53.7/h and REM sleep AHI of 84/h. There were 71.7 minutes of hypoxia during the sleep recording, also concerning. Nadir SpO2 was 63% OXYGEN.   Recommendations:      This severe and REM sleep dependent apnea needs to be treated with positive airway pressure therapy.  I will ask the patient to come to the sleep laboratory for a titration,  PLAN B: should the PAP titration study not be possible , we will arrange for an auto CPAP device with pressure settings between 6-18 cm water , 3 cm EPR and heated humidity with a mask of her choice . this needs to be fitted to the patient face to face.   Interpreting Physician: Jeb Levering, MD            Sleep Summary  Oxygen Saturation Statistics   Start Study Time: End Study Time: Total Recording Time:  9:58:01 PM 5:51:41 AM 7 h, 53 min  Total Sleep Time % REM of Sleep Time:  6 h, 32 min  18.3    Mean: 91 Minimum: 63 Maximum: 98  Mean of Desaturations Nadirs (%):   86  Oxygen Desaturation. %:  4-9 10-20 >20 Total  Events Number Total   157 66.5   63  16 26.7 6.8  236 100.0  Oxygen Saturation: <90 <=88  <85 <80 <70  Duration (minutes): Sleep %  71.7 18.3 57.3 14.6 26.5 13.0 6.8 3.3 0.9 0.2     Respiratory Indices      Total Events REM NREM All Night  pRDI:  339  pAHI:  334 ODI:  236  pAHIc:  48  % CSR: 16.4 84.7 83.8 62.1 14.1 48.3 47.5 33.0 6.4 54.5 53.7 38.0 7.7       Pulse Rate Statistics during Sleep (BPM)      Mean:  87 Minimum: 57 Maximum: 121    Indices are calculated using technically valid sleep time of  6 h, 12 min. pRDI/pAHI are calculated using oxi desaturations ? 3%  Body Position Statistics  Position Supine Prone Right Left Non-Supine  Sleep (min) 145.5 22.5 101.7 122.5 246.7  Sleep % 37.1 5.7 25.9 31.2 62.9  pRDI 61.7 51.7 48.4 51.6 50.3  pAHI 60.9 51.7 48.4 50.1 49.5  ODI 40.4 30.4 39.2 35.3 36.5     Snoring Statistics Snoring Level (dB) >40 >50 >60 >70 >80 >Threshold (45)  Sleep (min) 330.2 155.7 26.4 1.1 0.0 242.4  Sleep % 84.2 39.7 6.7 0.3 0.0 61.8    Mean: 49 dB

## 2019-05-31 NOTE — Addendum Note (Signed)
Addended by: Larey Seat on: 05/31/2019 05:11 PM   Modules accepted: Orders

## 2019-05-31 NOTE — Telephone Encounter (Signed)

## 2019-05-31 NOTE — Progress Notes (Signed)
Summary & Diagnosis:   Severe Obstructive sleep Apnea with an AHI of 53.7/h and REM  sleep AHI of 84/h. There were 71.7 minutes of hypoxia during the  sleep recording, also concerning. Nadir SpO2 was 63% OXYGEN.   Recommendations:     This severe and REM sleep dependent apnea needs to be treated  with positive airway pressure therapy.  I will ask the patient to come to the sleep laboratory for a  titration,  PLAN B: should the PAP titration study not be possible , we will  arrange for an auto CPAP device with pressure settings between  6-18 cm water , 3 cm EPR and heated humidity with a mask of her  choice . this needs to be fitted to the patient face to face.   Interpreting Physician: Jeb Levering, MD

## 2019-06-01 ENCOUNTER — Telehealth: Payer: Self-pay | Admitting: Neurology

## 2019-06-01 NOTE — Telephone Encounter (Signed)
I called pt. I advised pt that Dr. Brett Fairy reviewed their sleep study results and found that has severe sleep apnea and concerns related to hypoxemia and recommends that pt be treated with a cpap. Dr. Brett Fairy recommends that pt return for a repeat sleep study in order to properly titrate the cpap and ensure a good mask fit. Pt is agreeable to returning for a titration study. I advised pt that our sleep lab will file with pt's insurance and call pt to schedule the sleep study when we hear back from the pt's insurance regarding coverage of this sleep study. Pt verbalized understanding of results. Pt had no questions at this time but was encouraged to call back if questions arise. Pt requested a copy of the study be mailed to her.

## 2019-06-01 NOTE — Telephone Encounter (Signed)
-----   Message from Larey Seat, MD sent at 05/31/2019  5:11 PM EDT ----- Summary & Diagnosis:   Severe Obstructive sleep Apnea with an AHI of 53.7/h and REM  sleep AHI of 84/h. There were 71.7 minutes of hypoxia during the  sleep recording, also concerning. Nadir SpO2 was 63% OXYGEN.   Recommendations:     This severe and REM sleep dependent apnea needs to be treated  with positive airway pressure therapy.  I will ask the patient to come to the sleep laboratory for a  titration,  PLAN B: should the PAP titration study not be possible , we will  arrange for an auto CPAP device with pressure settings between  6-18 cm water , 3 cm EPR and heated humidity with a mask of her  choice . this needs to be fitted to the patient face to face.   Interpreting Physician: Jeb Levering, MD

## 2019-06-09 ENCOUNTER — Other Ambulatory Visit: Payer: Self-pay

## 2019-06-09 ENCOUNTER — Other Ambulatory Visit (HOSPITAL_COMMUNITY)
Admission: RE | Admit: 2019-06-09 | Discharge: 2019-06-09 | Disposition: A | Discharge status: Home / Self Care | Payer: Medicare Other | Source: Ambulatory Visit | Attending: Cardiovascular Disease | Admitting: Cardiovascular Disease

## 2019-06-09 DIAGNOSIS — Z20822 Contact with and (suspected) exposure to covid-19: Secondary | ICD-10-CM | POA: Diagnosis not present

## 2019-06-09 DIAGNOSIS — Z01812 Encounter for preprocedural laboratory examination: Secondary | ICD-10-CM | POA: Diagnosis not present

## 2019-06-10 LAB — SARS CORONAVIRUS 2 (TAT 6-24 HRS): SARS Coronavirus 2: NEGATIVE

## 2019-06-13 ENCOUNTER — Ambulatory Visit (HOSPITAL_COMMUNITY)
Admission: RE | Admit: 2019-06-13 | Discharge: 2019-06-13 | Disposition: A | Discharge status: Home / Self Care | Payer: Medicare Other | Source: Ambulatory Visit | Attending: Cardiovascular Disease | Admitting: Cardiovascular Disease

## 2019-06-13 ENCOUNTER — Other Ambulatory Visit: Payer: Self-pay

## 2019-06-13 DIAGNOSIS — I272 Pulmonary hypertension, unspecified: Secondary | ICD-10-CM | POA: Insufficient documentation

## 2019-06-13 LAB — PULMONARY FUNCTION TEST
DL/VA % pred: 107 %
DL/VA: 4.18 ml/min/mmHg/L
DLCO cor % pred: 78 %
DLCO cor: 18.07 ml/min/mmHg
DLCO unc % pred: 75 %
DLCO unc: 17.36 ml/min/mmHg
FEF 25-75 Pre: 1.61 L/sec
FEF2575-%Pred-Pre: 88 %
FEV1-%Pred-Pre: 71 %
FEV1-Pre: 1.84 L
FEV1FVC-%Pred-Pre: 102 %
FEV6-%Pred-Pre: 73 %
FEV6-Pre: 2.4 L
FEV6FVC-%Pred-Pre: 104 %
FVC-%Pred-Pre: 70 %
FVC-Pre: 2.41 L
Pre FEV1/FVC ratio: 76 %
Pre FEV6/FVC Ratio: 100 %
RV % pred: 128 %
RV: 3.42 L
TLC % pred: 94 %
TLC: 5.65 L

## 2019-06-14 ENCOUNTER — Other Ambulatory Visit: Payer: Self-pay | Admitting: *Deleted

## 2019-06-14 ENCOUNTER — Other Ambulatory Visit: Payer: Self-pay

## 2019-06-14 MED ORDER — DILTIAZEM HCL ER COATED BEADS 240 MG PO CP24: 240.0000 mg | ORAL_CAPSULE | Freq: Every day | ORAL | 3 refills | Status: DC

## 2019-06-14 NOTE — Patient Outreach (Signed)
Telephone outreach. Spoke to pt and daughter, Darlene [name] on speaker. [name] reports her mother is doing OK. They have been having trouble getting her mother's diltiazem renewed and filled. It has taken a couple of calls and they are hopeful to receive her medication today.  [name] denies feeling any palpitations, or chest pain. She does feel SOB on exertion. This has been going on for some time even before her actual AFIB diagnosis.  Darlene [name] reports her mother has now completed her HCPOA and Living Will. Have advised her to ensure Dr. Melford Aase has a copy and both children and one to keep at home.  No further needs identified today. Agreed to follow up in one month.  [facility] CM Care Plan Problem One     Most Recent Value  Care Plan Problem One  New diagnosis: AFIB  Role Documenting the Problem One  Care Management Coordinator  Care Plan for Problem One  Not Active  [facility] Long Term Goal   Pt will be able to describe her health problem and the things she does to manage this by the end of 60 days.  [facility] Long Term Goal Start Date  04/25/19  [facility] Long Term Goal Met Date  06/14/19  [facility] CM Short Term Goal #1   Pt will recieve [employer] and acknowledge she has read them and will review with NP within the next 30 days.  [facility] CM Short Term Goal #1 Start Date  04/25/19  [facility] CM Short Term Goal #1 Met Date  05/31/19    [facility] CM Care Plan Problem Two     Most Recent Value  Care Plan Problem Two  No Advanced Directives  Role Documenting the Problem Two  Care Management Coordinator  Care Plan for Problem Two  Not Active  Interventions for Problem Two Long Term Goal   Advised to provide copy to MD and to designated HCPOAs.  [facility] Long Term Goal  Pt will complete her Advanced Directives within the next 60 days.  [facility] Long Term Goal Start Date  04/28/19  [facility] Long Term Goal Met Date  06/14/19    [facility] CM Care Plan Problem Three     Most Recent Value  Care Plan Problem Three  Needs to identify a new eye  care provider and make an appt.  (Pended)   Role Documenting the Problem Three  Care Management Coordinator  (Pended)   Care Plan for Problem Three  Active  (Pended)      [name]. [name], MSN, [facility] Gerontological Nurse Practitioner [facility] Care Management [phone]

## 2019-06-14 NOTE — Telephone Encounter (Signed)
Per Cynthia(daughter), [facility] -Linna Hoff is waiting for Authorization to refill diltiazem.  Please call [name] [phone]   Thanks renee

## 2019-06-14 NOTE — Telephone Encounter (Signed)
[facility] is refilling rx and pt has text notification when it is ready

## 2019-06-14 NOTE — Telephone Encounter (Signed)
This is a [place] pt.  °

## 2019-06-19 ENCOUNTER — Ambulatory Visit (INDEPENDENT_AMBULATORY_CARE_PROVIDER_SITE_OTHER): Payer: Medicare Other | Admitting: Neurology

## 2019-06-19 DIAGNOSIS — G4733 Obstructive sleep apnea (adult) (pediatric): Secondary | ICD-10-CM

## 2019-06-19 DIAGNOSIS — R0902 Hypoxemia: Secondary | ICD-10-CM

## 2019-06-19 DIAGNOSIS — G4739 Other sleep apnea: Secondary | ICD-10-CM

## 2019-06-19 DIAGNOSIS — Z9981 Dependence on supplemental oxygen: Secondary | ICD-10-CM

## 2019-06-19 DIAGNOSIS — I4891 Unspecified atrial fibrillation: Secondary | ICD-10-CM

## 2019-06-20 ENCOUNTER — Telehealth: Payer: Self-pay | Admitting: *Deleted

## 2019-06-20 NOTE — Telephone Encounter (Signed)
Laurine Blazer, [place]  579FGE [id number] PM EDT    Daughter [name]) notified. Stated that she had sleep test done last night with [facility]).    Laurine Blazer, [place]  579FGE 624THL PM EDT    VM not set up.   Herminio Commons, MD  06/13/2019 3:40 PM EDT    No evidence of COPD. I suspect untreated sleep apnea is what is contributing to her pulmonary hypertension

## 2019-06-26 ENCOUNTER — Telehealth: Payer: Self-pay

## 2019-06-26 DIAGNOSIS — G4737 Central sleep apnea in conditions classified elsewhere: Secondary | ICD-10-CM | POA: Insufficient documentation

## 2019-06-26 NOTE — Telephone Encounter (Signed)
I called pt and discussed her sleep study results. Pt is agreeable to a bipap titration study. I advised her of abnormal EKG noted. Pt understands that our sleep lab will call her to schedule this study. Pt verbalized understanding of results. Pt had no questions at this time but was encouraged to call back if questions arise.

## 2019-06-26 NOTE — Procedures (Signed)
PATIENT'S NAME:  Darlene, [name] DOB:      1941-03-07      MR#:    [id number]     DATE OF RECORDING: 06/19/2019  [facility] REFERRING M.D.:  [name], MD / Dr. Bronson Ing, MD  Study Performed:   Titration to positive airway pressure  HISTORY:  This23 year old female patient has known atrial fibrillation, suffered a near syncopal event and had orthostatic hypotension. A HST was ordered.  Patient is returning for a PAP Titration sleep study after having a HST on 05/24/19. HST indicated severe sleep apnea at an AHI of 53.7/h and REM sleep apnea at 84/h. There were 71 minutes of hypoxemia -and SpO2 oxygen nadir of 63%. This study is meant to alleviate apnea by PAP therapy and add oxygen if hypoxemia persisted beyond apnea. The patient endorsed the Epworth Sleepiness Scale at 12/24 points.   The patient's weight 220 pounds with a height of 70 (inches), resulting in a BMI of 31.6 kg/m2. The patient's neck circumference measured 15.2 inches.  CURRENT MEDICATIONS: Tylenol, Eliquis, Vitamin C, Omnicef, cholecalciferol, cyanocobalamin, Flexeril, Cardizem, ferrous sulfate, Prevacid, Lopressor, Zinc PO, Paxil, Pilocar, Promethazine. PROCEDURE:  This is a multichannel digital polysomnogram utilizing the SomnoStar 11.2 system.  Electrodes and sensors were applied and monitored per AASM Specifications.   EEG, EOG, Chin and Limb EMG, were sampled at 200 Hz.  ECG, Snore and Nasal Pressure, Thermal Airflow, Respiratory Effort, CPAP Flow and Pressure, Oximetry was sampled at 50 Hz. Digital video and audio were recorded.      The patient was fitted with a ResMed Mirage Quattro Medium full-face mask. CPAP was initiated at 5 cmH20 with heated humidity per AASM split night standards and pressure was advanced to 16 cmH20 because of hypopneas, apneas and desaturations.  At a PAP pressure of 13 and 14 cmH20, there was a reduction of the AHI to 2.6 and 1.5/h with some improvement of sleep apnea. Hypoxemia was significantly  improved.   Lights Out was at 20:07 and Lights On at 05:12. Total recording time (TRT) was 545.5 minutes, with a total sleep time (TST) of 449 minutes. The patient's sleep latency was 24 minutes. REM latency was 157 minutes.  The sleep efficiency was 82.3 %.    SLEEP ARCHITECTURE: WASO (Wake after sleep onset) was 83.5 minutes.  There were 18.5 minutes in Stage N1, 304.5 minutes Stage N2, 38.5 minutes Stage N3 and 87.5 minutes in Stage REM.  The percentage of Stage N1 was 4.1%, Stage N2 was 67.8%, Stage N3 was 8.6% and Stage R (REM sleep) was 19.5%. The sleep architecture was notable for sleep fragmentation.  RESPIRATORY ANALYSIS:  There was a total of 82 respiratory events: 8 obstructive apneas, 10 central apneas and 0 mixed apneas with a total of 18 apneas and an apnea index (AHI) of 2.4 /hour. There were 64 hypopneas with a hypopnea index of 8.6/hour. The patient also had 0 respiratory event related arousals (RERAs). The total APNEA/HYPOPNEA INDEX (AHI) was 11.0 /hour.  29 events occurred in REM sleep and 53 events in NREM. The REM AHI was 19.9 /hour versus a non-REM AHI of 8.8 /hour. The patient spent 150 minutes of total sleep time in the supine position and 299 minutes in non-supine. The supine AHI was 17.2/h, versus a non-supine AHI of 7.8/h.  OXYGEN SATURATION & C02:  The baseline 02 saturation was 92%, with the lowest being 75%. Time spent below 89% saturation equaled 73 minutes.  The arousals were noted as: 36  were spontaneous, 29 were associated with PLMs ( nearly all before 2 AM ), 9 were associated with respiratory events. The patient had a PLM Arousal index of 3.9 /hour.  Audio and video analysis did not show any abnormal or unusual movements, behaviors, phonations or vocalizations. The frequent PLMs ceased during REM sleep and after higher CPAP pressure were applied.   Snoring was noted.  EKG was arrhythmic, see attached screen shots and note low oxygen levels for the first 2 hours of  recording.  DIAGNOSIS 1. Central Sleep Apnea  2. Sleep Apnea with dominantly central sleep apneas arising under PAP therapy. The patient was never free of apnea but responded the best to 13 and 14 cm water pressure CPAP. At the final pressure of 16 cm water, there was an AHI of 10.4  3. Sleep Related Hypoxemia improved under CPAP of 10 and more CM water pressure. No oxygen was added 4. Abnormal EKG   PLANS/RECOMMENDATIONS: 1. Return for BiPAP titration is advised. The CPAP device has improved OSA- obstructive sleep apnea- as seen here, but also provoked additional central apneas to emerge. 2.  PLMs did not need treatment as these responded to PAP titration related improvement in oxygen saturation    DISCUSSION: I like for the patient to be fitted here upon return -carefully- for the best sealing and most comfortable mask.    A follow up appointment will be scheduled in the Sleep Clinic at [facility].   Please call [phone] with any questions.      I certify that I have reviewed the entire raw data recording prior to the issuance of this report in accordance with the Standards of Accreditation of the American Academy of Sleep Medicine (AASM)   Larey Seat, M.D. Diplomat, Tax adviser of Psychiatry and Neurology  Diplomat, Tax adviser of Sleep Medicine Market researcher, [employer] [facility] at [employer]

## 2019-06-26 NOTE — Addendum Note (Signed)
Addended by: Larey Seat on: 06/26/2019 12:37 PM   Modules accepted: Orders

## 2019-06-26 NOTE — Progress Notes (Signed)
EKG was arrhythmic, see attached screen shots and note low oxygen levels for the first 2 hours of recording.  DIAGNOSIS 1. Central Sleep Apnea  2. Sleep Apnea with dominantly central sleep apneas arising under PAP therapy. The patient was never free of apnea but responded the best to 13 and 14 cm water pressure CPAP. At the final pressure of 16 cm water, there was an AHI of 10.4  3. Sleep Related Hypoxemia improved under CPAP of 10 and more CM water pressure. No oxygen was added 4. Abnormal EKG   PLANS/RECOMMENDATIONS: 1. Return for BiPAP titration is advised. The CPAP device has improved OSA- obstructive sleep apnea- as seen here, but also provoked additional central apneas to emerge. 2.  PLMs did not need treatment as these responded to PAP titration related improvement in oxygen saturation    DISCUSSION: I like for the patient to be fitted here upon return -carefully- for the best sealing and most comfortable mask.

## 2019-06-26 NOTE — Telephone Encounter (Signed)
-----   Message from Larey Seat, MD sent at 06/26/2019 12:36 PM EDT ----- EKG was arrhythmic, see attached screen shots and note low oxygen levels for the first 2 hours of recording.  DIAGNOSIS 1. Central Sleep Apnea  2. Sleep Apnea with dominantly central sleep apneas arising under PAP therapy. The patient was never free of apnea but responded the best to 13 and 14 cm water pressure CPAP. At the final pressure of 16 cm water, there was an AHI of 10.4  3. Sleep Related Hypoxemia improved under CPAP of 10 and more CM water pressure. No oxygen was added 4. Abnormal EKG   PLANS/RECOMMENDATIONS: 1. Return for BiPAP titration is advised. The CPAP device has improved OSA- obstructive sleep apnea- as seen here, but also provoked additional central apneas to emerge. 2.  PLMs did not need treatment as these responded to PAP titration related improvement in oxygen saturation    DISCUSSION: I like for the patient to be fitted here upon return -carefully- for the best sealing and most comfortable mask.

## 2019-07-06 ENCOUNTER — Other Ambulatory Visit (HOSPITAL_COMMUNITY)
Admission: RE | Admit: 2019-07-06 | Discharge: 2019-07-06 | Disposition: A | Discharge status: Home / Self Care | Payer: Medicare Other | Source: Ambulatory Visit | Attending: Neurology | Admitting: Neurology

## 2019-07-06 ENCOUNTER — Other Ambulatory Visit: Payer: Self-pay

## 2019-07-06 DIAGNOSIS — Z20822 Contact with and (suspected) exposure to covid-19: Secondary | ICD-10-CM | POA: Insufficient documentation

## 2019-07-06 DIAGNOSIS — Z01812 Encounter for preprocedural laboratory examination: Secondary | ICD-10-CM | POA: Diagnosis not present

## 2019-07-07 LAB — SARS CORONAVIRUS 2 (TAT 6-24 HRS): SARS Coronavirus 2: NEGATIVE

## 2019-07-11 ENCOUNTER — Ambulatory Visit (INDEPENDENT_AMBULATORY_CARE_PROVIDER_SITE_OTHER): Payer: Medicare Other | Admitting: Neurology

## 2019-07-11 DIAGNOSIS — R0902 Hypoxemia: Secondary | ICD-10-CM

## 2019-07-11 DIAGNOSIS — I4891 Unspecified atrial fibrillation: Secondary | ICD-10-CM

## 2019-07-11 DIAGNOSIS — Z9981 Dependence on supplemental oxygen: Secondary | ICD-10-CM

## 2019-07-11 DIAGNOSIS — G4739 Other sleep apnea: Secondary | ICD-10-CM

## 2019-07-11 DIAGNOSIS — G4737 Central sleep apnea in conditions classified elsewhere: Secondary | ICD-10-CM

## 2019-07-11 DIAGNOSIS — G4733 Obstructive sleep apnea (adult) (pediatric): Secondary | ICD-10-CM

## 2019-07-12 ENCOUNTER — Other Ambulatory Visit: Payer: Self-pay | Admitting: *Deleted

## 2019-07-12 NOTE — Patient Outreach (Signed)
[facility]) Care Management  07/12/2019  Darlene [name] 12-Jun-1940 [id number]   Telephone outreach:  Follow up questions: Have all meds? Yes she has all her meds now.  Advanced Directives to MD?  No. What are wishes related to CPR? Wants CPR.  Has had eye exam? No.  Pt has had a series of sleep studies and [name] can only handle addressing one problem at a time.  No new problems however. Daughter agrees to call me if there are any needs.   I will call back in 2 months.  [name]. [name], MSN, [facility] Gerontological Nurse Practitioner [facility] Care Management [phone]

## 2019-07-18 NOTE — Addendum Note (Signed)
Addended by: Larey Seat on: 07/18/2019 05:08 PM   Modules accepted: Orders

## 2019-07-18 NOTE — Procedures (Signed)
PATIENT'S NAME:  Darlene [name], Darlene [name] DOB:      Jul 05, 1940      MR#:    [id number]     DATE OF RECORDING: 07/11/2019 [facility] REFERRING M.D.:  [name], MD Study Performed:   Titration to positive airway pressure  Patient returning for a BPAP Titration sleep study after having a CPAP Titration sleep study on 06/19/19. Patient had an residual AHI of 10.4/h. Sleep Apnea with dominantly central sleep apneas arising under PAP therapy. The patient was never free of apnea but responded the best to 13 and 14 cm water pressure CPAP. At the final pressure of 16 cm water, there was an AHI of 10.4/h. Sleep Related Hypoxemia improved under CPAP of 10 and more CM water pressure. No oxygen was added. Abnormal EKG with variable R to r intervals, question of heart block.   Return for BiPAP titration is advised. The CPAP device has improved OSA- obstructive sleep apnea- as seen here, but also provoked additional central apneas to emerge. PLMs did not need treatment as these responded to PAP titration related improvement in oxygen saturation   The patient endorsed the Epworth Sleepiness Scale at 12/24 points.   The patient's weight 220 pounds with a height of 70 (inches), resulting in a BMI of 31.6 kg/m2. The patient's neck circumference measured 15.25 inches.  CURRENT MEDICATIONS: Tylenol, Eliquis, Vitamin C, Omnicef, Vit D cholecalciferol, Vit B12 cyanocobalamin, Flexeril, Cardizem, ferrous sulfate, Prevacid, Lopressor, Zinc PO, Paxil, Pilocar, Promethazine.  PROCEDURE:  This is a multichannel digital polysomnogram utilizing the SomnoStar 11.2 system.  Electrodes and sensors were applied and monitored per AASM Specifications.   EEG, EOG, Chin and Limb EMG, were sampled at 200 Hz.  ECG, Snore and Nasal Pressure, Thermal Airflow, Respiratory Effort, CPAP Flow and Pressure, Oximetry was sampled at 50 Hz. Digital video and audio were recorded.      The patient was given a ResMed Mirage Quattro medium mask. BiPAP was  initiated at 8/4 cmH20 with heated humidity per AASM split night standards and pressure was advanced to 19/15 cmH20 because of hypopneas, apneas and desaturations. ST was added.   At a BPAP pressure of 19/15 cmH20, there was no reduction of the AHI (30/h) and no improvement of sleep apnea. There was invariably a 4 cm spread in pressure used. No change to ASV was performed.   Lights Out was at 20:53 and Lights On at 04:58. Total recording time (TRT) was 485 minutes, with a total sleep time (TST) of 318 minutes. The patient's sleep latency was 44 minutes. REM latency was 107.5 minutes.  The sleep efficiency was only 65.6 %.   SLEEP ARCHITECTURE: WASO (Wake after sleep onset) was 125 minutes.  There were 18.5 minutes in Stage N1, 236.5 minutes Stage N2, 0 minutes Stage N3 and 63 minutes in Stage REM.  The percentage of Stage N1 was 5.8%, Stage N2 was 74.4%, Stage N3 was 0% and Stage R (REM sleep) was 19.8%.      RESPIRATORY ANALYSIS:  There was a total of 105 respiratory events: 19 obstructive apneas, 52 central apneas and 2 mixed apneas with a total of 73 apneas and an apnea index (AHI) of 13.8 /hour. There were 32 hypopneas with a hypopnea index of 6./hour. The patient also had 0 respiratory event related arousals (RERAs).      The total APNEA/HYPOPNEA INDEX  (AHI) was 19.8 /hour and the total RESPIRATORY DISTURBANCE INDEX was 19.8 /hour  33 events occurred in REM sleep and 72 events  in NREM. The REM AHI was 31.4 /hour versus a non-REM AHI of 16.9 /hour.  The patient spent 121 minutes of total sleep time in the supine position and 197 minutes in non-supine. The supine AHI was 33.3, versus a non-supine AHI of 11.6.  OXYGEN SATURATION & C02:  The baseline 02 saturation was 94%, with the lowest being 70%. Time spent below 89% saturation equaled 72 minutes. The arousals were noted as: 46 were spontaneous, 45 were associated with PLMs, 47 were associated with respiratory events. The patient had a total of  361 Periodic Limb Movements. None of these in REM sleep and few in the last 30% of recording, when her oxygen saturation had improved. The PLM Arousal index was 4.25 /hour. The patient remained with hypoxemic oxygen saturation for much of the study.    Audio and video analysis did not show any abnormal or unusual movements, behaviors, phonations or vocalizations.   EKG was irregular, abnormal R to R interval.   DIAGNOSIS 1. Central Sleep Apnea unresponsive to BiPAP 8/4 through 19/15 cm water.  2. Sleep Related Hypoxemia. 3. Periodic Limb Movement Disorder.  4. abnormal EKG.  PLANS/RECOMMENDATIONS: PAP therapy has failed and the next step is ASV titration.    DISCUSSION: Severe central sleep apnea. The patient remained with hypoxemic oxygen saturation for much of the study.       A follow up appointment will be scheduled in the Sleep Clinic at [facility].   Please call [phone] with any questions.     I certify that I have reviewed the entire raw data recording prior to the issuance of this report in accordance with the Standards of Accreditation of the American Academy of Sleep Medicine (AASM) Larey Seat, M.D. Diplomat, Tax adviser of Psychiatry and Neurology  Diplomat, Tax adviser of Sleep Medicine Market researcher, [employer] [facility] at [employer]

## 2019-07-18 NOTE — Progress Notes (Signed)
Central sleep apnea failed again to be controlled , this time by BiPAP and BiPAP ST- patient has failed CPAP before   4 cm pressure spread was used, most sleep was in supine position.   We will invite the patient to an ASV titration. PS for tech:  Please use oxygen if needed.

## 2019-07-19 ENCOUNTER — Telehealth: Payer: Self-pay | Admitting: Neurology

## 2019-07-19 NOTE — Telephone Encounter (Signed)
-----   Message from Larey Seat, MD sent at 07/18/2019  5:08 PM EDT ----- Central sleep apnea failed again to be controlled , this time by BiPAP and BiPAP ST- patient has failed CPAP before   4 cm pressure spread was used, most sleep was in supine position.   We will invite the patient to an ASV titration. PS for tech:  Please use oxygen if needed.

## 2019-07-19 NOTE — Telephone Encounter (Signed)
I called pt. I advised pt that Dr. Brett Fairy reviewed their sleep study results and found that was not able to tolerate or be treated under BiPAP and recommends that pt be treated with a ASV. Dr. Brett Fairy recommends that pt return for a repeat sleep study in order to properly titrate the ASV and ensure a good mask fit. Pt is agreeable to returning for a titration study but was upset by it. I did explain that insurance requires certain amount of time tried and failed on each machine and that is why it requires coming back. I advised pt that our sleep lab will file with pt's insurance and call pt to schedule the sleep study when we hear back from the pt's insurance regarding coverage of this sleep study. Pt verbalized understanding of results. Pt had no questions at this time but was encouraged to call back if questions arise.

## 2019-07-26 ENCOUNTER — Telehealth: Payer: Self-pay

## 2019-07-26 NOTE — Telephone Encounter (Signed)
Per Daughter [name], Please call with PFT results.   Please call [phone]   Thanks renee

## 2019-07-27 NOTE — Telephone Encounter (Signed)
Daughter was given results in April but had forgotten she spoke with staff.

## 2019-08-20 ENCOUNTER — Ambulatory Visit (INDEPENDENT_AMBULATORY_CARE_PROVIDER_SITE_OTHER): Payer: Medicare Other | Admitting: Neurology

## 2019-08-20 DIAGNOSIS — G4739 Other sleep apnea: Secondary | ICD-10-CM

## 2019-08-20 DIAGNOSIS — I4891 Unspecified atrial fibrillation: Secondary | ICD-10-CM

## 2019-08-20 DIAGNOSIS — G4737 Central sleep apnea in conditions classified elsewhere: Secondary | ICD-10-CM

## 2019-08-20 DIAGNOSIS — Z9981 Dependence on supplemental oxygen: Secondary | ICD-10-CM

## 2019-08-20 DIAGNOSIS — G4731 Primary central sleep apnea: Secondary | ICD-10-CM

## 2019-08-31 NOTE — Addendum Note (Signed)
Addended by: Larey Seat on: 08/31/2019 06:38 PM   Modules accepted: Orders

## 2019-08-31 NOTE — Progress Notes (Signed)
Audio and video analysis did not show any abnormal or unusual movements, behaviors, phonations or vocalizations. No Snoring was noted. EKG was highly irregular  DIAGNOSIS 1. Obstructive Sleep Apnea as indicated in the HST was easily treated on moderate CPAP pressures, which had not been the case before. 10 cm water CPAP with a nasal mask did work well and reduced some of the hypoxia. 2. ASV demonstration at 10/8/0/3 cm water let to an AHI of 0.0, high sleep efficiency and the nadir 02 rose to 91%.  This was the optimal result.  3. Sleep Related Hypoxemia persisted in REM Sleep.  4. Abnormal EKG, heart block.   PLANS/RECOMMENDATIONS: ASV 10/8/0/3 cm water with an Eson 2 medium mask.  1. Any apnea patient should avoid evening sedatives, hypnotics, and alcohol consumption 2. PAP therapy compliance is defined as 4 hours or more of nightly use.

## 2019-08-31 NOTE — Procedures (Signed)
PATIENTS NAME:  Darlene [name], Darlene [name] DOB:      1940-10-29      MR#:    [id number]     DATE OF RECORDING: 08/20/2019 REFERRING M.D.:  [name] MD Study Performed:   Titration to positive airway pressure and ASV HISTORY:  Kadasia Kassing returns for a titration. The patient has been seen for syncope, orthostatic in the setting of atrial fibrillation. Patient was hospitalized and on telemetry displayed hypoxia and apnoeic episodes. HST on 05-24-2019 documented AHI of 53.7/h nadir 63% 02, 71 minutes desaturation time.  CPAP let to central apneas which persisted under BiPAP.   Here for ASV.  The patient endorsed the Epworth Sleepiness Scale at 12 points.   The patients weight 220 pounds with a height of 70 (inches), resulting in a BMI of 31.6 kg/m2. The patients neck circumference measured 15 inches.  CURRENT MEDICATIONS: Tylenol, Eliquis, Vitamin C, Omnicef, cholecalciferol, cyanocobalamin, Flexeril, Cardizem, ferrous sulfate, Prevacid, Lopressor, Zinc PO, Paxil, Pilocar, Promethazine.    PROCEDURE:  This is a multichannel digital polysomnogram utilizing the SomnoStar 11.2 system.  Electrodes and sensors were applied and monitored per AASM Specifications.   EEG, EOG, Chin and Limb EMG, were sampled at 200 Hz.  ECG, Snore and Nasal Pressure, Thermal Airflow, Respiratory Effort, CPAP Flow and Pressure, Oximetry was sampled at 50 Hz. Digital video and audio were recorded.      The patient was fitted with a F&P Eson 2 medium mask.  CPAP was initiated at 5 cmH20 with heated humidity per AASM split night standards and pressure was advanced to 10 cmH20 because of hypopneas, apneas and desaturations.  At a PAP pressure of 10 cmH20, there was a reduction of the AHI to 0.0 with improvement of sleep apnea. ASV 10/8/0/3 let to prolonged REM rebound with complete alleviation of hypoxia. AHI 0.0/h.   Lights Out was at 21:32 and Lights On at 05:11. Total recording time (TRT) was 459.5 minutes, with a total sleep  time (TST) of 312.5 minutes. The patients sleep latency was 49 minutes. REM latency was 154.5 minutes.  The sleep efficiency was 68.9 0%.    SLEEP ARCHITECTURE: WASO (Wake after sleep onset) was 107.5 minutes.  There were 27.5 minutes in Stage N1, 94.5 minutes Stage N2, 108.5 minutes Stage N3 and 82 minutes in Stage REM.  The percentage of Stage N1 was 8.8%, Stage N2 was 30.2%, Stage N3 was 34.7% and Stage R (REM sleep) was 26.2%.   RESPIRATORY ANALYSIS:  There was a total of 1 respiratory event: 0 obstructive apneas, 0 central apneas and 0 mixed apneas with a total of 0 apneas and an apnea index (AI) of 0 /hour. There were 1 hypopnea with a hypopnea index of 0.2/hour.  The total APNEA/HYPOPNEA INDEX  (AHI) was 0.2 /hour  1 event occurred in REM sleep and 0 events in NREM. The REM AHI was 0.7 /hour versus a non-REM AHI of 0 /hour.  The patient spent 0 minutes of total sleep time in the supine position and 313 minutes in non-supine. The supine AHI was 0.0, versus a non-supine AHI of 0.2.  OXYGEN SATURATION & C02:  The baseline 02 saturation was 92%, with the lowest being 85%. Time spent below 89% saturation equaled 57 minutes. The arousals were noted as: 31 were spontaneous, 0 were associated with PLMs, 0 were associated with respiratory events. The patient had a total of 0 Periodic Limb Movements.   Audio and video analysis did not show any abnormal or unusual  movements, behaviors, phonations or vocalizations. No Snoring was noted. EKG was in keeping with normal sinus rhythm (NSR).  DIAGNOSIS 1. Obstructive Sleep Apnea as indicated in the HST was easily treated on moderate CPAP pressures, which had not been the case before. 10 cm water CPAP with a nasal mask did work well and reduced some of the hypoxia. 2. ASV demonstration at 10/8/0/3 cm water let to an AHI of 0.0, high sleep efficiency and the nadir 02 rose to 91%.    3. Sleep Related Hypoxemia persisted in REM Sleep.  4. Abnormal EKG, heart  block.   PLANS/RECOMMENDATIONS: ASV 10/8/0/3 cm water with an Eson 2 medium mask.  1. Any apnea patient should avoid evening sedatives, hypnotics, and alcohol consumption 2. PAP therapy compliance is defined as 4 hours of nightly use.  A follow up appointment will be scheduled in the Sleep Clinic at [facility].   Please call [phone] with any questions.      I certify that I have reviewed the entire raw data recording prior to the issuance of this report in accordance with the Standards of Accreditation of the American Academy of Sleep Medicine (AASM)    Larey Seat, M.D. Diplomat, Tax adviser of Psychiatry and Neurology  Diplomat, Tax adviser of Sleep Medicine Market researcher, [employer] [facility] at [employer]

## 2019-09-03 ENCOUNTER — Other Ambulatory Visit: Payer: Self-pay | Admitting: Internal Medicine

## 2019-09-05 ENCOUNTER — Other Ambulatory Visit: Payer: Self-pay | Admitting: Neurology

## 2019-09-05 ENCOUNTER — Telehealth: Payer: Self-pay | Admitting: Neurology

## 2019-09-05 DIAGNOSIS — G4733 Obstructive sleep apnea (adult) (pediatric): Secondary | ICD-10-CM

## 2019-09-05 DIAGNOSIS — Z9981 Dependence on supplemental oxygen: Secondary | ICD-10-CM

## 2019-09-05 DIAGNOSIS — R0683 Snoring: Secondary | ICD-10-CM

## 2019-09-05 DIAGNOSIS — E559 Vitamin D deficiency, unspecified: Secondary | ICD-10-CM

## 2019-09-05 DIAGNOSIS — I4891 Unspecified atrial fibrillation: Secondary | ICD-10-CM

## 2019-09-05 NOTE — Telephone Encounter (Signed)
I called Darlene [name]. I advised Darlene [name] that Dr. Brett Fairy reviewed their sleep study results and found that Darlene [name] has sleep apnea best treated with CPAP pressure of 10 cm. Dr. Brett Fairy recommends that Darlene [name] starts auto CPAP. I reviewed PAP compliance expectations with the Darlene [name]. Darlene [name] is agreeable to starting a CPAP. I advised Darlene [name] that an order will be sent to a DME, [facility], and [facility] will call the Darlene [name] within about one week after they file with the Darlene [name]'s insurance. [facility] will show the Darlene [name] how to use the machine, fit for masks, and troubleshoot the CPAP if needed. A follow up appt was made for insurance purposes with Dr. Brett Fairy on Aug 31,2021 at 3:30 pm. Darlene [name] verbalized understanding to arrive 15 minutes early and bring their CPAP. A letter with all of this information in it will be mailed to the Darlene [name] as a reminder. I verified with the Darlene [name] that the address we have on file is correct. Darlene [name] verbalized understanding of results. Darlene [name] had no questions at this time but was encouraged to call back if questions arise. I have sent the order to [facility] and have received confirmation that they have received the order.

## 2019-09-05 NOTE — Telephone Encounter (Signed)
-----   Message from Larey Seat, MD sent at 08/31/2019  6:38 PM EDT ----- Audio and video analysis did not show any abnormal or unusual movements, behaviors, phonations or vocalizations. No Snoring was noted. EKG was highly irregular  DIAGNOSIS 1. Obstructive Sleep Apnea as indicated in the HST was easily treated on moderate CPAP pressures, which had not been the case before. 10 cm water CPAP with a nasal mask did work well and reduced some of the hypoxia. 2. ASV demonstration at 10/8/0/3 cm water let to an AHI of 0.0, high sleep efficiency and the nadir 02 rose to 91%.  This was the optimal result.  3. Sleep Related Hypoxemia persisted in REM Sleep.  4. Abnormal EKG, heart block.   PLANS/RECOMMENDATIONS: ASV 10/8/0/3 cm water with an Eson 2 medium mask.  1. Any apnea patient should avoid evening sedatives, hypnotics, and alcohol consumption 2. PAP therapy compliance is defined as 4 hours or more of nightly use.

## 2019-09-06 ENCOUNTER — Telehealth: Payer: Medicare Other | Admitting: Cardiovascular Disease

## 2019-09-08 ENCOUNTER — Other Ambulatory Visit: Payer: Self-pay

## 2019-09-08 ENCOUNTER — Encounter: Payer: Self-pay | Admitting: Student

## 2019-09-08 ENCOUNTER — Telehealth (INDEPENDENT_AMBULATORY_CARE_PROVIDER_SITE_OTHER): Payer: Medicare Other | Admitting: Student

## 2019-09-08 VITALS — BP 141/77 | HR 81 | Ht 70.0 in | Wt 225.0 lb

## 2019-09-08 DIAGNOSIS — I272 Pulmonary hypertension, unspecified: Secondary | ICD-10-CM

## 2019-09-08 DIAGNOSIS — Z79899 Other long term (current) drug therapy: Secondary | ICD-10-CM

## 2019-09-08 DIAGNOSIS — Z7901 Long term (current) use of anticoagulants: Secondary | ICD-10-CM | POA: Diagnosis not present

## 2019-09-08 DIAGNOSIS — I4819 Other persistent atrial fibrillation: Secondary | ICD-10-CM | POA: Diagnosis not present

## 2019-09-08 DIAGNOSIS — I10 Essential (primary) hypertension: Secondary | ICD-10-CM

## 2019-09-08 MED ORDER — DILTIAZEM HCL ER COATED BEADS 300 MG PO CP24: 300.0000 mg | ORAL_CAPSULE | Freq: Every day | ORAL | 1 refills | Status: DC

## 2019-09-08 NOTE — Progress Notes (Signed)
Virtual Visit via Video Note   This visit type was conducted due to national recommendations for restrictions regarding the COVID-19 Pandemic (e.g. social distancing) in an effort to limit this patient's exposure and mitigate transmission in our community.  Due to her co-morbid illnesses, this patient is at least at moderate risk for complications without adequate follow up.  This format is felt to be most appropriate for this patient at this time.  All issues noted in this document were discussed and addressed.  A limited physical exam was performed with this format.  Please refer to the patient's chart for her consent to telehealth for [facility].   The patient was identified using 2 identifiers.  Date:  09/08/2019   ID:  Darlene, [name] 08-Jun-1940, MRN [id number]  Patient Location: Home Provider Location: Office  PCP:  [name], MD  Cardiologist:  Kate Sable, MD  Electrophysiologist:  None   Evaluation Performed:  Follow-Up Visit  Chief Complaint:  3 month visit  History of Present Illness:    Darlene [name] is a 79 y.o. female with past medical history of persistent atrial fibrillation (on Eliquis for anticoagulation), pulmonary hypertension, HTN and obesity who presents for a 3-month follow-up telehealth visit.  She was last examined by Dr. Bronson Ing in 05/2019 and reported still recovering from her weakness during her hospitalization in 04/2019 for a UTI and she had been found to be in atrial fibrillation with RVR during that admission. A rate control strategy was pursued given severe left atrial enlargement. She reported still having exertional dyspnea at the time of her visit but heart rate had overall been reasonably well-controlled. She was continued on Cardizem CD 240 mg daily along with Lopressor 50mg  in AM/25mg  in PM. PFT's were arranged given her pulmonary hypertension and this showed no evidence of COPD. It was felt that untreated sleep apnea was  likely the culprit of her pulmonary HTN.   In talking with the patient and her daughter today, she reports feeling like she has never regained her strength back following her hospitalization earlier this year. She denies any specific symptoms but feels like she has little energy. She does have baseline dyspnea on exertion with no acute changes in this. No recent chest pain or palpitations. She denies any recent orthopnea, PND or lower extremity edema. She is being followed by Neurology with plans to start CPAP once approved by her insurance.  They have been following her vitals closely at home and report her heart rate is typically in the 90's to low 100's at rest. She did send over a Kardia strip today and HR was in the 70's at that time which she reports is unusual for her.   The patient does not have symptoms concerning for COVID-19 infection (fever, chills, cough, or new shortness of breath).    Past Medical History:  Diagnosis Date  . Anxiety   . Chronic lower back pain   . Depression   . DJD (degenerative joint disease)    knees, neck  . GERD (gastroesophageal reflux disease)    has had dilitation in the past   Past Surgical History:  Procedure Laterality Date  . ABDOMINAL HYSTERECTOMY  03/1979  . BREAST BIOPSY Left 1986 and 1987  . CATARACT EXTRACTION, BILATERAL Bilateral 2014   Dr. Madelin Headings, in [place]     Current Meds  Medication Sig  . acetaminophen (TYLENOL) 500 MG tablet Take 500 mg by mouth every 6 (six) hours as needed.  Marland Kitchen  apixaban (ELIQUIS) 5 MG TABS tablet Take 1 tablet (5 mg total) by mouth 2 (two) times daily.  . Ascorbic Acid (VITAMIN C) 500 MG CAPS Take by mouth.  . CHOLECALCIFEROL PO Take 5,000 Units by mouth daily.  . cyclobenzaprine (FLEXERIL) 10 MG tablet Take 1/2 to 1 tablet    2 to 3 x /day if needed for Muscle Spasms  . diltiazem (CARDIZEM CD) 300 MG 24 hr capsule Take 1 capsule (300 mg total) by mouth daily.  . ferrous sulfate 325 (65 FE) MG tablet  Take 1 tablet (325 mg total) by mouth daily with breakfast.  . lansoprazole (PREVACID) 30 MG capsule Take 1 capsule Daily for Heartburn & Indigestion  . metoprolol tartrate (LOPRESSOR) 25 MG tablet Take 50 mg( 2 Tablets )  in the Morning and Take 25 mg ( 1 Tablet)  in the Evening  . PARoxetine (PAXIL) 20 MG tablet Take 1 tablet Daily for Mood  . pilocarpine (PILOCAR) 1 % ophthalmic solution 1 drop 2 (two) times daily.  . Probiotic Product (PROBIOTIC DAILY PO) Take 1 tablet by mouth daily.  . promethazine-dextromethorphan (PROMETHAZINE-DM) 6.25-15 MG/5ML syrup Take 1 to 2 tsp enery 4 hours if needed for cough  . zinc gluconate 50 MG tablet Take 50 mg by mouth daily.  . [DISCONTINUED] diltiazem (CARDIZEM CD) 240 MG 24 hr capsule Take 1 capsule (240 mg total) by mouth daily.     Allergies:   Cephalosporins   Social History   Tobacco Use  . Smoking status: Never Smoker  . Smokeless tobacco: Never Used  Vaping Use  . Vaping Use: Never used  Substance Use Topics  . Alcohol use: Yes    Alcohol/week: 3.0 standard drinks    Types: 3 Standard drinks or equivalent per week    Comment: 3 nights out of the week  . Drug use: No     Family Hx: The patient's family history includes Dementia in her brother; Heart disease in her father and mother; Hypertension in her daughter, father, and mother; Tremor in her sister. There is no history of Colon cancer, Esophageal cancer, Rectal cancer, or Stomach cancer.  ROS:   Please see the history of present illness.     All other systems reviewed and are negative.   Prior CV studies:   The following studies were reviewed today:  Echocardiogram: 03/2019 IMPRESSIONS    1. Left ventricular ejection fraction, by visual estimation, is 55 to  60%. The left ventricle has normal function. There is mildly increased  left ventricular hypertrophy.  2. Left ventricular diastolic parameters are indeterminate.  3. The left ventricle has no regional wall  motion abnormalities.  4. Global right ventricle has normal systolic function.The right  ventricular size is moderately enlarged. No increase in right ventricular  wall thickness.  5. Left atrial size was severely dilated.  6. Right atrial size was moderately dilated.  7. The mitral valve is normal in structure. Mild mitral valve  regurgitation. No evidence of mitral stenosis.  8. The tricuspid valve is normal in structure.  9. The tricuspid valve is normal in structure. Tricuspid valve  regurgitation is mild.  10. The aortic valve is tricuspid. Aortic valve regurgitation is not  visualized. No evidence of aortic valve sclerosis or stenosis.  11. The pulmonic valve was not well visualized. Pulmonic valve  regurgitation is not visualized.  12. Moderately elevated pulmonary artery systolic pressure.  13. The inferior vena cava is dilated in size with <50% respiratory  variability,  suggesting right atrial pressure of 15 mmHg.   Labs/Other Tests and Data Reviewed:    EKG:  The patient's [facility] cardiac telemetry strip(s) personally reviewed today demonstrate:  Rate-controlled atrial fibrillation, HR in 70's.   Recent Labs: 05/25/2019: ALT 16; BUN 20; Creat 1.08; Hemoglobin 12.2; Magnesium 1.9; Platelets 256; Potassium 4.1; Sodium 145; TSH 2.96   Recent Lipid Panel Lab Results  Component Value Date/Time   CHOL 178 05/25/2019 03:19 PM   TRIG 100 05/25/2019 03:19 PM   HDL 60 05/25/2019 03:19 PM   CHOLHDL 3.0 05/25/2019 03:19 PM   LDLCALC 98 05/25/2019 03:19 PM    Wt Readings from Last 3 Encounters:  09/08/19 225 lb (102.1 kg)  05/25/19 226 lb 9.6 oz (102.8 kg)  05/25/19 226 lb (102.5 kg)     Objective:    Vital Signs:  BP (!) 141/77   Pulse 81   Ht 5\' 10"  (1.778 m)   Wt 225 lb (102.1 kg)   BMI 32.28 kg/m    General: Pleasant female appearing in NAD Psych: Normal affect. Neuro: Alert and oriented X 3. Moves all extremities spontaneously. HEENT: Normal by video  assessment.  Lungs:  Resp appear regular and unlabored.  ASSESSMENT & PLAN:    1. Persistent Atrial Fibrillation/ Use of Anticoagulation - She reports a resting HR typically in the 90's to low-100's and continues to experience fatigue which is likely multifactorial in the setting of deconditioning and her elevated rates. She is currently on Cardizem CD 240mg  daily and Lopressor 50mg  in AM/25mg  in PM. Will titrate Cardizem to 300mg  daily. I encouraged the patient and her daughter to follow vitals at home and report back on her readings in several weeks. If heart rate remains elevated, will further titrate Cardizem CD to 360 mg daily. By review of notes, a rate control strategy has been pursued given her severe left atrial dilation by recent echocardiogram and previously untreated OSA. - She denies any evidence of active bleeding. Remains on Eliquis 5 mg twice daily for anticoagulation.  Given her fatigue, I did recommend obtaining a repeat CBC with upcoming labs.  2. HTN - BP was elevated at 141/77 when checked earlier today. Will plan to titrate Cardizem CD to 300 mg daily as outlined above and continue Lopressor at current dosing for now.  3. Pulmonary HTN - She did have moderately elevated pulmonary artery systolic pressures by echocardiogram in 03/2019.  PFT's showed no evidence of COPD. She does have known OSA and is being followed by Neurology with plans to arrange for home CPAP.     COVID-19 Education: The signs and symptoms of COVID-19 were discussed with the patient and how to seek care for testing (follow up with PCP or arrange E-visit).  The importance of social distancing was discussed today.  Time:   Today, I have spent 22 minutes with the patient with telehealth technology discussing the above problems.     Medication Adjustments/Labs and Tests Ordered: Current medicines are reviewed at length with the patient today.  Concerns regarding medicines are outlined above.   Tests  Ordered: Orders Placed This Encounter  Procedures  . CBC    Medication Changes: Meds ordered this encounter  Medications  . diltiazem (CARDIZEM CD) 300 MG 24 hr capsule    Sig: Take 1 capsule (300 mg total) by mouth daily.    Dispense:  30 capsule    Refill:  1    Order Specific Question:   Supervising Provider  AnswerDorothy Spark [[id number]]    Follow Up:  In Person in 3 month(s)  Signed, Waynetta Pean  09/08/2019 4:35 PM    [facility]

## 2019-09-08 NOTE — Patient Instructions (Addendum)
Medication Instructions:  Your physician has recommended you make the following change in your medication:   Increase Cardizem to 300 mg Daily   Monitor Blood Pressure and Pulse for 2 weeks and return to office.   *If you need a refill on your cardiac medications before your next appointment, please call your pharmacy*   Lab Work: Your physician recommends that you return for lab work in: [place]   If you have labs (blood work) drawn today and your tests are completely normal, you will receive your results only by: Marland Kitchen MyChart Message (if you have MyChart) OR . A paper copy in the mail If you have any lab test that is abnormal or we need to change your treatment, we will call you to review the results.   Testing/Procedures: NONE     Follow-Up: At [facility], you and your health needs are our priority.  As part of our continuing mission to provide you with exceptional heart care, we have created designated Provider Care Teams.  These Care Teams include your primary Cardiologist (physician) and Advanced Practice Providers (APPs -  Physician Assistants and Nurse Practitioners) who all work together to provide you with the care you need, when you need it.  We recommend signing up for the patient portal called "MyChart".  Sign up information is provided on this After Visit Summary.  MyChart is used to connect with patients for Virtual Visits (Telemedicine).  Patients are able to view lab/test results, encounter notes, upcoming appointments, etc.  Non-urgent messages can be sent to your provider as well.   To learn more about what you can do with MyChart, go to [email or web address].    Your next appointment:   3 month(s)  The format for your next appointment:   In Person  Provider:   Kate Sable, MD or Bernerd Pho, PA-C   Other Instructions Thank you for choosing [facility]!

## 2019-09-11 ENCOUNTER — Other Ambulatory Visit: Payer: Self-pay | Admitting: *Deleted

## 2019-09-11 NOTE — Patient Outreach (Signed)
[facility]) Care Management  09/11/2019  Darlene [name] 1940/12/21 [id number]  Telephone outreach: completed on 09/13/19  AFIB: Pt is stable. Cardizem was increased to 300 mg daily  Eye exam: Has not scheduled. Feels too preoccupied with other issues.  Sleep apnea: Evaluation completed on 08/20/19. Have not received call to pick up equipment to date.  Called [facility] or [facility] and found they have not received an order. Sent inbox message to Gerline Legacy, who had documented that the order was being processed, in order to alert her to a delay in pt getting her CPAP equipment. Asked for communication to verify order has been sent, received and when pt will be offered her equipment.  Will now reduce outreaches to quarterly calls. Daughter encouraged to call me if any issues arise. Ongoing goal will be to avoid hospitalization by following MD recommended regimens and early outreach for medical help when problems arise.  Darlene [name]. [name], MSN, [facility] Gerontological Nurse Practitioner [facility] Care Management [phone]

## 2019-09-28 ENCOUNTER — Other Ambulatory Visit: Payer: Self-pay | Admitting: Pharmacist

## 2019-09-28 NOTE — Patient Outreach (Signed)
[place] [facility])  [place]   09/28/2019  LEALA BRYAND 1940/07/09 [id number]  Reason for referral: medication assistance  Referral source: Bradd Canary, NP Referral medication(s): Eliquis Current insurance: Medicare A&B, AARP supplement  HPI:  Patient's daughter [name]) called me back after we spoke a few weeks ago about medication assistance.   At the time, she was unsure about her mother's income and out-of-pocket medication expenses. HIPAA identifiers were obtained.    Patient is a 79 year old female with multiple medical conditions including but not limited to:  Atrial fibrillation, chronic back pain, depression, GERD, hyperlipidemia, sleep apnea, vitamin D deficiency and LVH due to hypertension.   Objective: Allergies  Allergen Reactions  . Cephalosporins Rash    Medications Reviewed Today    Reviewed by Elayne Guerin, [facility] (Pharmacist) on 09/28/19 at [place] List Status: <None>  Medication Order Taking? Sig Documenting Provider Last Dose Status Informant  acetaminophen (TYLENOL) 500 MG tablet [id number] Yes Take 500 mg by mouth every 6 (six) hours as needed. [provider] Taking Active Self  apixaban (ELIQUIS) 5 MG TABS tablet [id number] Yes Take 1 tablet (5 mg total) by mouth 2 (two) times daily. Herminio Commons, MD Taking Active   Ascorbic Acid (VITAMIN C) 500 MG CAPS [id number] Yes Take 500 mg by mouth.  [provider] Taking Active Self  CHOLECALCIFEROL PO [id number] Yes Take 5,000 Units by mouth daily. [provider] Taking Active Self           Med Note Grier Rocher Mar 31, 2018 10:59 AM) 7,000units  cyanocobalamin 1000 MCG tablet [id number] Yes Take 1,000 mcg by mouth daily. [provider] Taking Active Self  cyclobenzaprine (FLEXERIL) 10 MG tablet [id number] Yes Take 1/2 to 1 tablet    2 to 3 x /day if needed for Muscle Spasms [name], MD Taking Active   diclofenac (VOLTAREN)  75 MG EC tablet [id number] Yes Take 75 mg by mouth 2 (two) times daily as needed for pain. [provider] Taking Active   diltiazem (CARDIZEM CD) 300 MG 24 hr capsule [id number] Yes Take 1 capsule (300 mg total) by mouth daily. Erma Heritage, PA-C Taking Active   ferrous sulfate 325 (65 FE) MG tablet [id number] Yes Take 1 tablet (325 mg total) by mouth daily with breakfast. Orson Eva, MD Taking Active   lansoprazole (PREVACID) 30 MG capsule [id number] Yes Take 1 capsule Daily for Heartburn & Indigestion [name], MD Taking Active   metoprolol tartrate (LOPRESSOR) 25 MG tablet [id number] Yes Take 50 mg( 2 Tablets )  in the Morning and Take 25 mg ( 1 Tablet)  in the Evening Imogene Burn, PA-C Taking Active   PARoxetine (PAXIL) 20 MG tablet [id number] Yes Take 1 tablet Daily for Mood [name], MD Taking Active        Patient not taking:      Discontinued 09/28/19 0926 (Completed Course)   Probiotic Product (PROBIOTIC DAILY PO) [id number] Yes Take 1 tablet by mouth daily. [provider] Taking Active   promethazine-dextromethorphan (PROMETHAZINE-DM) 6.25-15 MG/5ML syrup [id number] Yes Take 1 to 2 tsp enery 4 hours if needed for cough [name], MD Taking Active Self           Med Note Elayne Guerin   Fri May 12, 2019  3:55 PM) PRN  zinc gluconate 50 MG tablet [id number] Yes Take 50 mg by mouth daily. [provider]  Taking Active           Medication Assistance Findings:  Patient is over income for LIS/Extra Help Her daughter reported she has spent the required 3% of her income on medication expenses.  Additional medication assistance options reviewed with patient as warranted:  No other options identified  Plan: I will route patient assistance letter to [place] technician who will coordinate patient assistance program application process for medications listed above.  [facility] pharmacy technician will assist with obtaining all required  documents from both patient and provider(s) and submit application(s) once completed.    Elayne Guerin, PharmD, [place] Clinical Pharmacist [phone]

## 2019-10-03 ENCOUNTER — Other Ambulatory Visit: Payer: Self-pay | Admitting: Pharmacy Technician

## 2019-10-03 NOTE — Patient Outreach (Signed)
[facility]) Care Management  10/03/2019  Darlene [name] May 01, 1940 [id number]                                      Medication Assistance Referral  Referral From: [place]  Medication/Company: Eliquis / BMS Patient application portion:  Mailed Provider application portion: Faxed  to Dr. Kate Sable Provider address/fax verified via: Office website     Follow up:  Will follow up with patient in 5-15 business days to confirm application(s) have been received.  [name] P. Paulita Licklider, [place]  [phone]

## 2019-10-09 ENCOUNTER — Encounter: Payer: Self-pay | Admitting: Internal Medicine

## 2019-10-09 NOTE — Patient Instructions (Signed)
Due to recent changes in healthcare laws, you may see the results of your imaging and laboratory studies on MyChart before your provider has had a chance to review them.  We understand that in some cases there may be results that are confusing or concerning to you. Not all laboratory results come back in the same time frame and the provider may be waiting for multiple results in order to interpret others.  Please give [place] 48 hours in order for your provider to thoroughly review all the results before contacting the office for clarification of your results.   +++++++++++++++++++++++++  Vit D  & Vit C 1,000 mg   are recommended to help protect  against the Covid-19 and other Corona viruses.    Also it's recommended  to take  Zinc 50 mg  to help  protect against the Covid-19   and best place to get  is also on [email or web address]  and don't pay more than 6-8 cents /pill !  ================================ Coronavirus (COVID-19) Are you at risk?  Are you at risk for the Coronavirus (COVID-19)?  To be considered HIGH RISK for Coronavirus (COVID-19), you have to meet the following criteria:  . Traveled to [place], [place], [place], [place] or [place]; or in the [place] to [place], [place], [place]  . or [place]; and have fever, cough, and shortness of breath within the last 2 weeks of travel OR . Been in close contact with a person diagnosed with COVID-19 within the last 2 weeks and have  . fever, cough,and shortness of breath .  . IF YOU DO NOT MEET THESE CRITERIA, YOU ARE CONSIDERED LOW RISK FOR COVID-19.  What to do if you are HIGH RISK for COVID-19?  Marland Kitchen If you are having a medical emergency, call 911. . Seek medical care right away. Before you go to a doctor's office, urgent care or emergency department, .  call ahead and tell them about your recent travel, contact with someone diagnosed with COVID-19  .  and your symptoms.  . You should receive instructions from your physician's  office regarding next steps of care.  . When you arrive at healthcare provider, tell the healthcare staff immediately you have returned from  . visiting [place], [place], [place], [place] or [place]; or traveled in the [place] to [place], [place],  . [place] or [place] in the last two weeks or you have been in close contact with a person diagnosed with  . COVID-19 in the last 2 weeks.   . Tell the health care staff about your symptoms: fever, cough and shortness of breath. . After you have been seen by a medical provider, you will be either: o Tested for (COVID-19) and discharged home on quarantine except to seek medical care if  o symptoms worsen, and asked to  - Stay home and avoid contact with others until you get your results (4-5 days)  - Avoid travel on public transportation if possible (such as bus, train, or airplane) or o Sent to the Emergency Department by EMS for evaluation, COVID-19 testing  and  o possible admission depending on your condition and test results.  What to do if you are LOW RISK for COVID-19?  Reduce your risk of any infection by using the same precautions used for avoiding the common cold or flu:  Marland Kitchen Wash your hands often with soap and warm water for at least 20 seconds.  If soap and water are not readily  available,  . use an alcohol-based hand sanitizer with at least 60% alcohol.  . If coughing or sneezing, cover your mouth and nose by coughing or sneezing into the elbow areas of your shirt or coat, .  into a tissue or into your sleeve (not your hands). . Avoid shaking hands with others and consider head nods or verbal greetings only. . Avoid touching your eyes, nose, or mouth with unwashed hands.  . Avoid close contact with people who are sick. . Avoid places or events with large numbers of people in one location, like concerts or sporting events. . Carefully consider travel plans you have or are making. . If you are planning any travel outside or  inside the [place], visit the CDC's Travelers' Health webpage for the latest health notices. . If you have some symptoms but not all symptoms, continue to monitor at home and seek medical attention  . if your symptoms worsen. . If you are having a medical emergency, call 911.   . >>>>>>>>>>>>>>>>>>>>>>>>>>>>>>>>> . We Do NOT Approve of  [facility], [employer]  Our Patients  To Do Home Visits & We Do NOT Approve of [employer] > > > > > > > > > > > > > > > > > > > > > > > > > > > > > > > > > > > > > > >  Preventive Care for Adults  A healthy lifestyle and preventive care can promote health and wellness. Preventive health guidelines for women include the following key practices.  A routine yearly physical is a good way to check with your health care provider about your health and preventive screening. It is a chance to share any concerns and updates on your health and to receive a thorough exam.  Visit your dentist for a routine exam and preventive care every 6 months. Brush your teeth twice a day and floss once a day. Good oral hygiene prevents tooth decay and gum disease.  The frequency of eye exams is based on your age, health, family medical history, use of contact lenses, and other factors. Follow your health care provider's recommendations for frequency of eye exams.  Eat a healthy diet. Foods like vegetables, fruits, whole grains, low-fat dairy products, and lean protein foods contain the nutrients you need without too many calories. Decrease your intake of foods high in solid fats, added sugars, and salt. Eat the right amount of calories for you. Get information about a proper diet from your health care provider, if necessary.  Regular physical exercise is one of the most important things you can do for your health. Most adults should get at least 150 minutes of moderate-intensity exercise (any activity that increases your heart rate and causes you to sweat) each  week. In addition, most adults need muscle-strengthening exercises on 2 or more days a week.  Maintain a healthy weight. The body mass index (BMI) is a screening tool to identify possible weight problems. It provides an estimate of body fat based on height and weight. Your health care provider can find your BMI and can help you achieve or maintain a healthy weight. For adults 20 years and older:  A BMI below 18.5 is considered underweight.  A BMI of 18.5 to 24.9 is normal.  A BMI of 25 to 29.9 is considered overweight.  A BMI of 30 and above is considered obese.  Maintain normal blood lipids and cholesterol levels by exercising and minimizing your intake of  saturated fat. Eat a balanced diet with plenty of fruit and vegetables. If your lipid or cholesterol levels are high, you are over 50, or you are at high risk for heart disease, you may need your cholesterol levels checked more frequently. Ongoing high lipid and cholesterol levels should be treated with medicines if diet and exercise are not working.  If you smoke, find out from your health care provider how to quit. If you do not use tobacco, do not start.  Lung cancer screening is recommended for adults aged 54-80 years who are at high risk for developing lung cancer because of a history of smoking. A yearly low-dose CT scan of the lungs is recommended for people who have at least a 30-pack-year history of smoking and are a current smoker or have quit within the past 15 years. A pack year of smoking is smoking an average of 1 pack of cigarettes a day for 1 year (for example: 1 pack a day for 30 years or 2 packs a day for 15 years). Yearly screening should continue until the smoker has stopped smoking for at least 15 years. Yearly screening should be stopped for people who develop a health problem that would prevent them from having lung cancer treatment.  Avoid use of street drugs. Do not share needles with anyone. Ask for help if you need  support or instructions about stopping the use of drugs.  High blood pressure causes heart disease and increases the risk of stroke.  Ongoing high blood pressure should be treated with medicines if weight loss and exercise do not work.  If you are 30-30 years old, ask your health care provider if you should take aspirin to prevent strokes.  Diabetes screening involves taking a blood sample to check your fasting blood sugar level. This should be done once every 3 years, after age 13, if you are within normal weight and without risk factors for diabetes. Testing should be considered at a younger age or be carried out more frequently if you are overweight and have at least 1 risk factor for diabetes.  Breast cancer screening is essential preventive care for women. You should practice "breast self-awareness." This means understanding the normal appearance and feel of your breasts and may include breast self-examination. Any changes detected, no matter how small, should be reported to a health care provider. Women in their 68s and 30s should have a clinical breast exam (CBE) by a health care provider as part of a regular health exam every 1 to 3 years. After age 55, women should have a CBE every year. Starting at age 27, women should consider having a mammogram (breast X-ray test) every year. Women who have a family history of breast cancer should talk to their health care provider about genetic screening. Women at a high risk of breast cancer should talk to their health care providers about having an MRI and a mammogram every year.  Breast cancer gene (BRCA)-related cancer risk assessment is recommended for women who have family members with BRCA-related cancers. BRCA-related cancers include breast, ovarian, tubal, and peritoneal cancers. Having family members with these cancers may be associated with an increased risk for harmful changes (mutations) in the breast cancer genes BRCA1 and BRCA2. Results of the  assessment will determine the need for genetic counseling and BRCA1 and BRCA2 testing.  Routine pelvic exams to screen for cancer are no longer recommended for nonpregnant women who are considered low risk for cancer of the pelvic organs (ovaries,  uterus, and vagina) and who do not have symptoms. Ask your health care provider if a screening pelvic exam is right for you.  If you have had past treatment for cervical cancer or a condition that could lead to cancer, you need Pap tests and screening for cancer for at least 20 years after your treatment. If Pap tests have been discontinued, your risk factors (such as having a new sexual partner) need to be reassessed to determine if screening should be resumed. Some women have medical problems that increase the chance of getting cervical cancer. In these cases, your health care provider may recommend more frequent screening and Pap tests.    Colorectal cancer can be detected and often prevented. Most routine colorectal cancer screening begins at the age of 62 years and continues through age 28 years. However, your health care provider may recommend screening at an earlier age if you have risk factors for colon cancer. On a yearly basis, your health care provider may provide home test kits to check for hidden blood in the stool. Use of a small camera at the end of a tube, to directly examine the colon (sigmoidoscopy or colonoscopy), can detect the earliest forms of colorectal cancer. Talk to your health care provider about this at age 23, when routine screening begins.  Direct exam of the colon should be repeated every 5-10 years through age 31 years, unless early forms of pre-cancerous polyps or small growths are found.  Osteoporosis is a disease in which the bones lose minerals and strength with aging. This can result in serious bone fractures or breaks. The risk of osteoporosis can be identified using a bone density scan. Women ages 83 years and over and women  at risk for fractures or osteoporosis should discuss screening with their health care providers. Ask your health care provider whether you should take a calcium supplement or vitamin D to reduce the rate of osteoporosis.  Menopause can be associated with physical symptoms and risks. Hormone replacement therapy is available to decrease symptoms and risks. You should talk to your health care provider about whether hormone replacement therapy is right for you.  Use sunscreen. Apply sunscreen liberally and repeatedly throughout the day. You should seek shade when your shadow is shorter than you. Protect yourself by wearing long sleeves, pants, a wide-brimmed hat, and sunglasses year round, whenever you are outdoors.  Once a month, do a whole body skin exam, using a mirror to look at the skin on your back. Tell your health care provider of new moles, moles that have irregular borders, moles that are larger than a pencil eraser, or moles that have changed in shape or color.  Stay current with required vaccines (immunizations).  Influenza vaccine. All adults should be immunized every year.  Tetanus, diphtheria, and acellular pertussis (Td, Tdap) vaccine. Pregnant women should receive 1 dose of Tdap vaccine during each pregnancy. The dose should be obtained regardless of the length of time since the last dose. Immunization is preferred during the 27th-36th week of gestation. An adult who has not previously received Tdap or who does not know her vaccine status should receive 1 dose of Tdap. This initial dose should be followed by tetanus and diphtheria toxoids (Td) booster doses every 10 years. Adults with an unknown or incomplete history of completing a 3-dose immunization series with Td-containing vaccines should begin or complete a primary immunization series including a Tdap dose. Adults should receive a Td booster every 10 years.  Zoster vaccine. One dose is recommended for adults aged 64 years or  older unless certain conditions are present.    Pneumococcal 13-valent conjugate (PCV13) vaccine. When indicated, a person who is uncertain of her immunization history and has no record of immunization should receive the PCV13 vaccine. An adult aged 61 years or older who has certain medical conditions and has not been previously immunized should receive 1 dose of PCV13 vaccine. This PCV13 should be followed with a dose of pneumococcal polysaccharide (PPSV23) vaccine. The PPSV23 vaccine dose should be obtained at least 1 or more year(s) after the dose of PCV13 vaccine. An adult aged 46 years or older who has certain medical conditions and previously received 1 or more doses of PPSV23 vaccine should receive 1 dose of PCV13. The PCV13 vaccine dose should be obtained 1 or more years after the last PPSV23 vaccine dose.    Pneumococcal polysaccharide (PPSV23) vaccine. When PCV13 is also indicated, PCV13 should be obtained first. All adults aged 58 years and older should be immunized. An adult younger than age 1 years who has certain medical conditions should be immunized. Any person who resides in a nursing home or long-term care facility should be immunized. An adult smoker should be immunized. People with an immunocompromised condition and certain other conditions should receive both PCV13 and PPSV23 vaccines. People with human immunodeficiency virus (HIV) infection should be immunized as soon as possible after diagnosis. Immunization during chemotherapy or radiation therapy should be avoided. Routine use of PPSV23 vaccine is not recommended for American Indians, [place] Natives, or people younger than 65 years unless there are medical conditions that require PPSV23 vaccine. When indicated, people who have unknown immunization and have no record of immunization should receive PPSV23 vaccine. One-time revaccination 5 years after the first dose of PPSV23 is recommended for people aged 19-64 years who have chronic  kidney failure, nephrotic syndrome, asplenia, or immunocompromised conditions. People who received 1-2 doses of PPSV23 before age 11 years should receive another dose of PPSV23 vaccine at age 70 years or later if at least 5 years have passed since the previous dose. Doses of PPSV23 are not needed for people immunized with PPSV23 at or after age 65 years.   Preventive Services / Frequency  Ages 56 years and over  Blood pressure check.  Lipid and cholesterol check.  Lung cancer screening. / Every year if you are aged 16-80 years and have a 30-pack-year history of smoking and currently smoke or have quit within the past 15 years. Yearly screening is stopped once you have quit smoking for at least 15 years or develop a health problem that would prevent you from having lung cancer treatment.  Clinical breast exam.** / Every year after age 59 years.   BRCA-related cancer risk assessment.** / For women who have family members with a BRCA-related cancer (breast, ovarian, tubal, or peritoneal cancers).  Mammogram.** / Every year beginning at age 70 years and continuing for as long as you are in good health. Consult with your health care provider.  Pap test.** / Every 3 years starting at age 32 years through age 28 or 9 years with 3 consecutive normal Pap tests. Testing can be stopped between 65 and 70 years with 3 consecutive normal Pap tests and no abnormal Pap or HPV tests in the past 10 years.  Fecal occult blood test (FOBT) of stool. / Every year beginning at age 57 years and continuing until age 29 years. You may not need to  do this test if you get a colonoscopy every 10 years.  Flexible sigmoidoscopy or colonoscopy.** / Every 5 years for a flexible sigmoidoscopy or every 10 years for a colonoscopy beginning at age 21 years and continuing until age 56 years.  Hepatitis C blood test.** / For all people born from [birth year] through 1966 and any individual with known risks for hepatitis  C.  Osteoporosis screening.** / A one-time screening for women ages 53 years and over and women at risk for fractures or osteoporosis.  Skin self-exam. / Monthly.  Influenza vaccine. / Every year.  Tetanus, diphtheria, and acellular pertussis (Tdap/Td) vaccine.** / 1 dose of Td every 10 years.  Zoster vaccine.** / 1 dose for adults aged 32 years or older.  Pneumococcal 13-valent conjugate (PCV13) vaccine.** / Consult your health care provider.  Pneumococcal polysaccharide (PPSV23) vaccine.** / 1 dose for all adults aged 67 years and older. Screening for abdominal aortic aneurysm (AAA)  by ultrasound is recommended for people who have history of high blood pressure or who are current or former smokers. ++++++++++++++++++++ Recommend Adult Low Dose Aspirin or  coated  Aspirin 81 mg daily  To reduce risk of Colon Cancer 40 %,  Skin Cancer 26 % ,  Melanoma 46%  and  Pancreatic cancer 60% ++++++++++++++++++++ Vitamin D goal  is between 70-100.  Please make sure that you are taking your Vitamin D as directed.  It is very important as a natural anti-inflammatory  helping hair, skin, and nails, as well as reducing stroke and heart attack risk.  It helps your bones and helps with mood. It also decreases numerous cancer risks so please take it as directed.  Low Vit D is associated with a 200-300% higher risk for CANCER  and 200-300% higher risk for HEART   ATTACK  &  STROKE.   .....................................Marland Kitchen It is also associated with higher death rate at younger ages,  autoimmune diseases like Rheumatoid arthritis, Lupus, Multiple Sclerosis.    Also many other serious conditions, like depression, Alzheimer's Dementia, infertility, muscle aches, fatigue, fibromyalgia - just to name a few. ++++++++++++++++++ Recommend the book "The END of DIETING" by Dr Excell Seltzer  & the book "The END of DIABETES " by Dr Excell Seltzer At [email or web address] - get book & Audio CD's    Being diabetic has  a  300% increased risk for heart attack, stroke, cancer, and alzheimer- type vascular dementia. It is very important that you work harder with diet by avoiding all foods that are white. Avoid white rice (brown & wild rice is OK), white potatoes (sweetpotatoes in moderation is OK), White bread or wheat bread or anything made out of white flour like bagels, donuts, rolls, buns, biscuits, cakes, pastries, cookies, pizza crust, and pasta (made from white flour & egg whites) - vegetarian pasta or spinach or wheat pasta is OK. Multigrain breads like Arnold's or [employer], or multigrain sandwich thins or flatbreads.  Diet, exercise and weight loss can reverse and cure diabetes in the early stages.  Diet, exercise and weight loss is very important in the control and prevention of complications of diabetes which affects every system in your body, ie. Brain - dementia/stroke, eyes - glaucoma/blindness, heart - heart attack/heart failure, kidneys - dialysis, stomach - gastric paralysis, intestines - malabsorption, nerves - severe painful neuritis, circulation - gangrene & loss of a leg(s), and finally cancer and Alzheimers.    I recommend avoid fried & greasy foods,  sweets/candy, white rice (brown or wild  rice or Quinoa is OK), white potatoes (sweet potatoes are OK) - anything made from white flour - bagels, doughnuts, rolls, buns, biscuits,white and wheat breads, pizza crust and traditional pasta made of white flour & egg white(vegetarian pasta or spinach or wheat pasta is OK).  Multi-grain bread is OK - like multi-grain flat bread or sandwich thins. Avoid alcohol in excess. Exercise is also important.    Eat all the vegetables you want - avoid meat, especially red meat and dairy - especially cheese.  Cheese is the most concentrated form of trans-fats which is the worst thing to clog up our arteries. Veggie cheese is OK which can be found in the fresh produce section at [employer] or [employer] or  [employer]  +++++++++++++++++++ DASH Eating Plan  DASH stands for "Dietary Approaches to Stop Hypertension."   The DASH eating plan is a healthy eating plan that has been shown to reduce high blood pressure (hypertension). Additional health benefits may include reducing the risk of type 2 diabetes mellitus, heart disease, and stroke. The DASH eating plan may also help with weight loss. WHAT DO I NEED TO KNOW ABOUT THE DASH EATING PLAN? For the DASH eating plan, you will follow these general guidelines:  Choose foods with a percent daily value for sodium of less than 5% (as listed on the food label).  Use salt-free seasonings or herbs instead of table salt or sea salt.  Check with your health care provider or pharmacist before using salt substitutes.  Eat lower-sodium products, often labeled as "lower sodium" or "no salt added."  Eat fresh foods.  Eat more vegetables, fruits, and low-fat dairy products.  Choose whole grains. Look for the word "whole" as the first word in the ingredient list.  Choose fish   Limit sweets, desserts, sugars, and sugary drinks.  Choose heart-healthy fats.  Eat veggie cheese   Eat more home-cooked food and less restaurant, buffet, and fast food.  Limit fried foods.  Cook foods using methods other than frying.  Limit canned vegetables. If you do use them, rinse them well to decrease the sodium.  When eating at a restaurant, ask that your food be prepared with less salt, or no salt if possible.                      WHAT FOODS CAN I EAT? Read Dr Fara Olden Fuhrman's books on The End of Dieting & The End of Diabetes  Grains Whole grain or whole wheat bread. Brown rice. Whole grain or whole wheat pasta. Quinoa, bulgur, and whole grain cereals. Low-sodium cereals. Corn or whole wheat flour tortillas. Whole grain cornbread. Whole grain crackers. Low-sodium crackers.  Vegetables Fresh or frozen vegetables (raw, steamed, roasted, or grilled). Low-sodium or  reduced-sodium tomato and vegetable juices. Low-sodium or reduced-sodium tomato sauce and paste. Low-sodium or reduced-sodium canned vegetables.   Fruits All fresh, canned (in natural juice), or frozen fruits.  Protein Products  All fish and seafood.  Dried beans, peas, or lentils. Unsalted nuts and seeds. Unsalted canned beans.  Dairy Low-fat dairy products, such as skim or 1% milk, 2% or reduced-fat cheeses, low-fat ricotta or cottage cheese, or plain low-fat yogurt. Low-sodium or reduced-sodium cheeses.  Fats and Oils Tub margarines without trans fats. Light or reduced-fat mayonnaise and salad dressings (reduced sodium). Avocado. Safflower, olive, or canola oils. Natural peanut or almond butter.  Other Unsalted popcorn and pretzels. The items listed above may not be a complete list of recommended foods  or beverages. Contact your dietitian for more options.  +++++++++++++++  WHAT FOODS ARE NOT RECOMMENDED? Grains/ White flour or wheat flour White bread. White pasta. White rice. Refined cornbread. Bagels and croissants. Crackers that contain trans fat.  Vegetables  Creamed or fried vegetables. Vegetables in a . Regular canned vegetables. Regular canned tomato sauce and paste. Regular tomato and vegetable juices.  Fruits Dried fruits. Canned fruit in light or heavy syrup. Fruit juice.  Meat and Other Protein Products Meat in general - RED meat & White meat.  Fatty cuts of meat. Ribs, chicken wings, all processed meats as bacon, sausage, bologna, salami, fatback, hot dogs, bratwurst and packaged luncheon meats.  Dairy Whole or 2% milk, cream, half-and-half, and cream cheese. Whole-fat or sweetened yogurt. Full-fat cheeses or blue cheese. Non-dairy creamers and whipped toppings. Processed cheese, cheese spreads, or cheese curds.  Condiments Onion and garlic salt, seasoned salt, table salt, and sea salt. Canned and packaged gravies. Worcestershire sauce. Tartar sauce. Barbecue  sauce. Teriyaki sauce. Soy sauce, including reduced sodium. Steak sauce. Fish sauce. Oyster sauce. Cocktail sauce. Horseradish. Ketchup and mustard. Meat flavorings and tenderizers. Bouillon cubes. Hot sauce. Tabasco sauce. Marinades. Taco seasonings. Relishes.  Fats and Oils Butter, stick margarine, lard, shortening and bacon fat. Coconut, palm kernel, or palm oils. Regular salad dressings.  Pickles and olives. Salted popcorn and pretzels.  The items listed above may not be a complete list of foods and beverages to avoid.

## 2019-10-09 NOTE — Progress Notes (Signed)
Comprehensive Evaluation &  Examination     This very nice 79 y.o.  WWF presents for a comprehensive evaluation and management of multiple medical co-morbidities.  Patient has been followed for labile HTN, ASHD/pAfib HLD, Prediabetes  and Vitamin D Deficiency. _Patient has GERD controlled with Prevacid. She also has Depression controlled & in remission on Paxil.      Patient was hospitalized in Jan/Feb with rapid Afib concerted to NSRwith iv then switched to oral Diltiazem.  Patient denies any cardiac symptoms as chest pain, palpitations, shortness of breath, dizziness or ankle swelling. Today's BP is at goal -  126/74.      Patient's hyperlipidemia is controlled with diet and medications. Patient denies myalgias or other medication SE's. Last lipids were at goal:  Lab Results  Component Value Date   CHOL 178 05/25/2019   HDL 60 05/25/2019   LDLCALC 98 05/25/2019   TRIG 100 05/25/2019   CHOLHDL 3.0 05/25/2019       Patient has  moderate Obesity (BMI 31.9+) and is monitored expectantly for glucose intolerance and patient denies reactive hypoglycemic symptoms, visual blurring, diabetic polys or paresthesias. Last A1c was near goal:  Lab Results  Component Value Date   HGBA1C 5.8 (H) 05/25/2019       Finally, patient has history of Vitamin D Deficiency ("28" / 2019)  and last Vitamin D was at goal:  Lab Results  Component Value Date   VD25OH 69 05/25/2019    Current Outpatient Medications on File Prior to Visit  Medication Sig  . acetaminophen (TYLENOL) 500 MG tablet Take 500 mg by mouth every 6 (six) hours as needed.  Marland Kitchen apixaban (ELIQUIS) 5 MG TABS tablet Take 1 tablet (5 mg total) by mouth 2 (two) times daily.  . Ascorbic Acid (VITAMIN C) 500 MG CAPS Take 500 mg by mouth.   . CHOLECALCIFEROL PO Take 5,000 Units by mouth daily.  . cyanocobalamin 1000 MCG tablet Take 1,000 mcg by mouth daily.  . cyclobenzaprine (FLEXERIL) 10 MG tablet Take 1/2 to 1 tablet    2 to 3 x /day if  needed for Muscle Spasms  . diltiazem (CARDIZEM CD) 300 MG 24 hr capsule Take 1 capsule (300 mg total) by mouth daily.  . ferrous sulfate 325 (65 FE) MG tablet Take 1 tablet (325 mg total) by mouth daily with breakfast.  . lansoprazole (PREVACID) 30 MG capsule Take 1 capsule Daily for Heartburn & Indigestion  . metoprolol tartrate (LOPRESSOR) 25 MG tablet Take 50 mg( 2 Tablets )  in the Morning and Take 25 mg ( 1 Tablet)  in the Evening  . PARoxetine (PAXIL) 20 MG tablet Take 1 tablet Daily for Mood  . Probiotic Product (PROBIOTIC DAILY PO) Take 1 tablet by mouth daily.  Marland Kitchen zinc gluconate 50 MG tablet Take 50 mg by mouth daily.   No current facility-administered medications on file prior to visit.   Allergies  Allergen Reactions  . Cephalosporins Rash   Past Medical History:  Diagnosis Date  . Anxiety   . Chronic lower back pain   . Depression   . DJD (degenerative joint disease)    knees, neck  . GERD (gastroesophageal reflux disease)    has had dilitation in the past   Health Maintenance  Topic Date Due  . Hepatitis C Screening  Never done  . DEXA SCAN  04/02/2020 (Originally 01/13/2005)  . TETANUS/TDAP  04/02/2020 (Originally 01/14/1960)  . INFLUENZA VACCINE  10/15/2019  . COLONOSCOPY  06/07/2020  . COVID-19 Vaccine  Completed  . PNA vac Low Risk Adult  Completed    Last Colon - 06/07/2017 - Dr Fuller Plan - adenomatous Polyps - Recc 3 yr f/u due Apr 2022 Last EGD - 06/07/2017 - Dr Fuller Plan - dilated esophageal stricture  Last MGM - 120/02/2019  Past Surgical History:  Procedure Laterality Date  . ABDOMINAL HYSTERECTOMY  03/1979  . BREAST BIOPSY Left 1986 and 1987  . CATARACT EXTRACTION, BILATERAL Bilateral 2014   Dr. Madelin Headings, in [place]   Family History  Problem Relation Age of Onset  . Hypertension Mother   . Heart disease Mother   . Hypertension Father   . Heart disease Father   . Tremor Sister   . Dementia Brother   . Hypertension Daughter   . Colon cancer  Neg Hx   . Esophageal cancer Neg Hx   . Rectal cancer Neg Hx   . Stomach cancer Neg Hx    Social History   Tobacco Use  . Smoking status: Never Smoker  . Smokeless tobacco: Never Used  Vaping Use  . Vaping Use: Never used  Substance Use Topics  . Alcohol use: Yes    Alcohol/week: 3.0 standard drinks    Types: 3 Standard drinks or equivalent per week    Comment: 3 nights out of the week  . Drug use: No    ROS Constitutional: Denies fever, chills, weight loss/gain, headaches, insomnia,  night sweats, and change in appetite. Does c/o fatigue. Eyes: Denies redness, blurred vision, diplopia, discharge, itchy, watery eyes.  ENT: Denies discharge, congestion, post nasal drip, epistaxis, sore throat, earache, hearing loss, dental pain, Tinnitus, Vertigo, Sinus pain, snoring.  Cardio: Denies chest pain, palpitations, irregular heartbeat, syncope, dyspnea, diaphoresis, orthopnea, PND, claudication, edema Respiratory: denies cough, dyspnea, DOE, pleurisy, hoarseness, laryngitis, wheezing.  Gastrointestinal: Denies dysphagia, heartburn, reflux, water brash, pain, cramps, nausea, vomiting, bloating, diarrhea, constipation, hematemesis, melena, hematochezia, jaundice, hemorrhoids Genitourinary: Denies dysuria, frequency, urgency, nocturia, hesitancy, discharge, hematuria, flank pain Breast: Breast lumps, nipple discharge, bleeding.  Musculoskeletal: Denies arthralgia, myalgia, stiffness, Jt. Swelling, pain, limp, and strain/sprain. Denies falls. Skin: Denies puritis, rash, hives, warts, acne, eczema, changing in skin lesion Neuro: No weakness, tremor, incoordination, spasms, paresthesia, pain Psychiatric: Denies confusion, memory loss, sensory loss. Denies Depression. Endocrine: Denies change in weight, skin, hair change, nocturia, and paresthesia, diabetic polys, visual blurring, hyper / hypo glycemic episodes.  Heme/Lymph: No excessive bleeding, bruising, enlarged lymph nodes.  Physical  Exam  BP 126/74   Pulse 74   Temp 97.6 F (36.4 C)   Resp 18   Ht 5\' 10"  (1.778 m)   Wt (!) 233 lb 12.8 oz (106.1 kg)   SpO2 99%   BMI 33.55 kg/m   General Appearance: Over nourished, well groomed and in no apparent distress.  Eyes: PERRLA, EOMs, conjunctiva no swelling or erythema, normal fundi and vessels. Sinuses: No frontal/maxillary tenderness ENT/Mouth: EACs patent / TMs  nl. Nares clear without erythema, swelling, mucoid exudates. Oral hygiene is good. No erythema, swelling, or exudate. Tongue normal, non-obstructing. Tonsils not swollen or erythematous. Hearing normal.  Neck: Supple, thyroid not palpable. No bruits, nodes or JVD. Respiratory: Respiratory effort normal.  BS equal and clear bilateral without rales, rhonci, wheezing or stridor. Cardio: Heart sounds are softwith irregular rate and rhythm and no murmus. Peripheral pulses are normal and equal bilaterally without edema. No aortic or femoral bruits. Chest: symmetric with normal excursions and percussion. Breasts: Symmetric, without lumps, nipple discharge, retractions,  or fibrocystic changes.  Abdomen:  Rotund soft with bowel sounds active. Nontender, no guarding, rebound, hernias, masses, or organomegaly.  Lymphatics: Non tender without lymphadenopathy.  Genitourinary:  Musculoskeletal: Full ROM all peripheral extremities, joint stability, 5/5 strength, and normal gait. Skin: Warm and dry without rashes, lesions, cyanosis, clubbing or  ecchymosis.  Neuro: Cranial nerves intact, reflexes equal bilaterally. Normal muscle tone, no cerebellar symptoms. Sensation intact.  Pysch: Alert and oriented x 3, normal affect, Insight and Judgment appropriate.   Assessment and Plan  1. Labile hypertension  - EKG 12-Lead - Urinalysis, Routine w reflex microscopic - Microalbumin / creatinine urine ratio - CBC with Differential/Platelet - COMPLETE METABOLIC PANEL WITH GFR - Magnesium - TSH  2. Hyperlipidemia, mixed  - EKG  12-Lead - Lipid panel - TSH  3. Abnormal glucose  - EKG 12-Lead - Hemoglobin A1c - Insulin, random  4. Vitamin D deficiency  - VITAMIN D 25 Hydroxy  5. Paroxysmal atrial fibrillation (HCC)  - EKG 12-Lead - TSH  6. Gastroesophageal reflux disease  - CBC with Differential/Platelet  7. Recurrent major depressive disorder, in full remission ([place])   8. Screening for colorectal cancer  - POC Hemoccult Bld/Stl  9. Chronic anemia  - Iron,Total/Total Iron Binding Cap - CBC with Differential/Platelet  10. Screening for ischemic heart disease  - EKG 12-Lead  11. FHx: heart disease  - EKG 12-Lead  12. E-coli UTI  - Urinalysis, Routine w reflex microscopic - Urine Culture  13. Medication management  - Urinalysis, Routine w reflex microscopic - Microalbumin / creatinine urine ratio - CBC with Differential/Platelet - COMPLETE METABOLIC PANEL WITH GFR - Magnesium - Lipid panel - TSH - Hemoglobin A1c - Insulin, random - VITAMIN D 25 Hydroxy        Patient was counseled in prudent diet to achieve/maintain BMI less than 25 for weight control, BP monitoring, regular exercise and medications. Discussed med's effects and SE's. Screening labs and tests as requested with regular follow-up as recommended. Over 40 minutes of exam, counseling, chart review and high complex critical decision making was performed.   Kirtland Bouchard, MD

## 2019-10-10 ENCOUNTER — Ambulatory Visit (INDEPENDENT_AMBULATORY_CARE_PROVIDER_SITE_OTHER): Payer: Medicare Other | Admitting: Internal Medicine

## 2019-10-10 ENCOUNTER — Other Ambulatory Visit: Payer: Self-pay

## 2019-10-10 VITALS — BP 126/74 | HR 74 | Temp 97.6°F | Resp 18 | Ht 70.0 in | Wt 233.8 lb

## 2019-10-10 DIAGNOSIS — Z79899 Other long term (current) drug therapy: Secondary | ICD-10-CM

## 2019-10-10 DIAGNOSIS — Z136 Encounter for screening for cardiovascular disorders: Secondary | ICD-10-CM

## 2019-10-10 DIAGNOSIS — N390 Urinary tract infection, site not specified: Secondary | ICD-10-CM | POA: Diagnosis not present

## 2019-10-10 DIAGNOSIS — D649 Anemia, unspecified: Secondary | ICD-10-CM

## 2019-10-10 DIAGNOSIS — R0989 Other specified symptoms and signs involving the circulatory and respiratory systems: Secondary | ICD-10-CM | POA: Diagnosis not present

## 2019-10-10 DIAGNOSIS — E782 Mixed hyperlipidemia: Secondary | ICD-10-CM | POA: Diagnosis not present

## 2019-10-10 DIAGNOSIS — I480 Paroxysmal atrial fibrillation: Secondary | ICD-10-CM

## 2019-10-10 DIAGNOSIS — B9620 Unspecified Escherichia coli [E. coli] as the cause of diseases classified elsewhere: Secondary | ICD-10-CM | POA: Diagnosis not present

## 2019-10-10 DIAGNOSIS — E559 Vitamin D deficiency, unspecified: Secondary | ICD-10-CM | POA: Diagnosis not present

## 2019-10-10 DIAGNOSIS — R7309 Other abnormal glucose: Secondary | ICD-10-CM | POA: Diagnosis not present

## 2019-10-10 DIAGNOSIS — F3342 Major depressive disorder, recurrent, in full remission: Secondary | ICD-10-CM

## 2019-10-10 DIAGNOSIS — K219 Gastro-esophageal reflux disease without esophagitis: Secondary | ICD-10-CM

## 2019-10-10 DIAGNOSIS — Z1211 Encounter for screening for malignant neoplasm of colon: Secondary | ICD-10-CM

## 2019-10-10 DIAGNOSIS — Z8249 Family history of ischemic heart disease and other diseases of the circulatory system: Secondary | ICD-10-CM

## 2019-10-11 ENCOUNTER — Telehealth: Payer: Self-pay | Admitting: *Deleted

## 2019-10-11 ENCOUNTER — Other Ambulatory Visit: Payer: Self-pay | Admitting: Internal Medicine

## 2019-10-11 DIAGNOSIS — N3000 Acute cystitis without hematuria: Secondary | ICD-10-CM

## 2019-10-11 NOTE — Telephone Encounter (Signed)
Entered in error

## 2019-10-12 ENCOUNTER — Other Ambulatory Visit: Payer: Self-pay | Admitting: Pharmacy Technician

## 2019-10-12 ENCOUNTER — Other Ambulatory Visit: Payer: Medicare Other

## 2019-10-12 LAB — COMPLETE METABOLIC PANEL WITH GFR
AG Ratio: 1.8 (calc) (ref 1.0–2.5)
ALT: 11 U/L (ref 6–29)
AST: 14 U/L (ref 10–35)
Albumin: 3.8 g/dL (ref 3.6–5.1)
Alkaline phosphatase (APISO): 57 U/L (ref 37–153)
BUN: 20 mg/dL (ref 7–25)
CO2: 28 mmol/L (ref 20–32)
Calcium: 9.7 mg/dL (ref 8.6–10.4)
Chloride: 106 mmol/L (ref 98–110)
Creat: 0.77 mg/dL (ref 0.60–0.93)
GFR, Est African American: 85 mL/min/{1.73_m2} (ref >=60)
GFR, Est Non African American: 73 mL/min/{1.73_m2} (ref >=60)
Globulin: 2.1 g/dL (calc) (ref 1.9–3.7)
Glucose, Bld: 92 mg/dL (ref 65–99)
Potassium: 4.6 mmol/L (ref 3.5–5.3)
Sodium: 143 mmol/L (ref 135–146)
Total Bilirubin: 0.6 mg/dL (ref 0.2–1.2)
Total Protein: 5.9 g/dL — ABNORMAL LOW (ref 6.1–8.1)

## 2019-10-12 LAB — URINALYSIS, ROUTINE W REFLEX MICROSCOPIC
Bilirubin Urine: NEGATIVE
Glucose, UA: NEGATIVE
Hgb urine dipstick: NEGATIVE
Hyaline Cast: NONE SEEN /LPF
Ketones, ur: NEGATIVE
Nitrite: NEGATIVE
Protein, ur: NEGATIVE
Specific Gravity, Urine: 1.023 (ref 1.001–1.03)
WBC, UA: >=60 /HPF — AB (ref 0–5)
pH: 5.5 (ref 5.0–8.0)

## 2019-10-12 LAB — LIPID PANEL
Cholesterol: 176 mg/dL (ref <200)
HDL: 66 mg/dL (ref >=50)
LDL Cholesterol (Calc): 95 mg/dL (calc)
Non-HDL Cholesterol (Calc): 110 mg/dL (calc) (ref <130)
Total CHOL/HDL Ratio: 2.7 (calc) (ref <5.0)
Triglycerides: 66 mg/dL (ref <150)

## 2019-10-12 LAB — URINE CULTURE
MICRO NUMBER:: 10755994
SPECIMEN QUALITY:: ADEQUATE

## 2019-10-12 LAB — IRON, TOTAL/TOTAL IRON BINDING CAP: Iron: 144 ug/dL (ref 45–160)

## 2019-10-12 LAB — CBC WITH DIFFERENTIAL/PLATELET
Absolute Monocytes: 762 cells/uL (ref 200–950)
Basophils Absolute: 32 cells/uL (ref 0–200)
Basophils Relative: 0.5 %
Eosinophils Absolute: 83 cells/uL (ref 15–500)
Eosinophils Relative: 1.3 %
HCT: 39.3 % (ref 35.0–45.0)
Hemoglobin: 13.3 g/dL (ref 11.7–15.5)
Lymphs Abs: 1517 cells/uL (ref 850–3900)
MCH: 32.5 pg (ref 27.0–33.0)
MCHC: 33.8 g/dL (ref 32.0–36.0)
MCV: 96.1 fL (ref 80.0–100.0)
MPV: 11.7 fL (ref 7.5–12.5)
Monocytes Relative: 11.9 %
Neutro Abs: 4006 cells/uL (ref 1500–7800)
Neutrophils Relative %: 62.6 %
Platelets: 196 10*3/uL (ref 140–400)
RBC: 4.09 10*6/uL (ref 3.80–5.10)
RDW: 13.1 % (ref 11.0–15.0)
Total Lymphocyte: 23.7 %
WBC: 6.4 10*3/uL (ref 3.8–10.8)

## 2019-10-12 LAB — MICROALBUMIN / CREATININE URINE RATIO
Creatinine, Urine: 152 mg/dL (ref 20–275)
Microalb Creat Ratio: 11 mcg/mg creat (ref <30)
Microalb, Ur: 1.6 mg/dL

## 2019-10-12 LAB — INSULIN, RANDOM: Insulin: 7.4 u[IU]/mL

## 2019-10-12 LAB — MAGNESIUM: Magnesium: 2 mg/dL (ref 1.5–2.5)

## 2019-10-12 LAB — HEMOGLOBIN A1C
Hgb A1c MFr Bld: 5.8 % of total Hgb — ABNORMAL HIGH (ref <5.7)
Mean Plasma Glucose: 120 (calc)
eAG (mmol/L): 6.6 (calc)

## 2019-10-12 LAB — VITAMIN D 25 HYDROXY (VIT D DEFICIENCY, FRACTURES): Vit D, 25-Hydroxy: 62 ng/mL (ref 30–100)

## 2019-10-12 LAB — TSH: TSH: 2.86 mIU/L (ref 0.40–4.50)

## 2019-10-12 NOTE — Patient Outreach (Signed)
[name]) Care Management  10/12/2019  Darlene [name] 08/23/1940 [id number]   Unsuccessful call placed to patient regarding patient assistance application(s) for Eliquis with BMS , No voicemail was able to be left as the phone number had an automated message that states mailbox has not been set up, try again later.   Was calling to inquire if patient had received the application that was mailed to her on 10/03/2019.  Follow up:  Will follow up with 2nd outreach attempt in 3-7 business days.  Dvontae Ruan P. Santiaga Butzin, [place]  [phone]

## 2019-10-13 ENCOUNTER — Other Ambulatory Visit: Payer: Medicare Other

## 2019-10-13 ENCOUNTER — Other Ambulatory Visit: Payer: Self-pay

## 2019-10-13 DIAGNOSIS — N3000 Acute cystitis without hematuria: Secondary | ICD-10-CM | POA: Diagnosis not present

## 2019-10-15 ENCOUNTER — Other Ambulatory Visit: Payer: Self-pay | Admitting: Internal Medicine

## 2019-10-15 DIAGNOSIS — N3000 Acute cystitis without hematuria: Secondary | ICD-10-CM

## 2019-10-15 LAB — URINE CULTURE
MICRO NUMBER:: 10771083
SPECIMEN QUALITY:: ADEQUATE

## 2019-10-15 MED ORDER — CIPROFLOXACIN HCL 250 MG PO TABS: ORAL_TABLET | ORAL | 0 refills | Status: DC

## 2019-10-18 ENCOUNTER — Other Ambulatory Visit: Payer: Self-pay | Admitting: Pharmacy Technician

## 2019-10-18 NOTE — Patient Outreach (Signed)
[facility]) Care Management  10/18/2019  CAELAN ATCHLEY [birth year] [id number]    Unsuccessful call placed to patient regarding patient assistance application(s) for Eliquis with BMS , No voicemail was able to be left as the phone number had an automated message that staes mailbox has not been set up, try again later..   Today was 2nd unsuccessful outreach call to inquire if patient had received the application that was mailed out on 10/03/2019 and as such the case will be closed. Will gladly re open the case if the materials are mailed back.  Follow up:  Will route note to [place] for case closure  Lagena Strand P. Harlen Danford, [place]  [phone]

## 2019-11-02 ENCOUNTER — Other Ambulatory Visit: Payer: Self-pay | Admitting: Student

## 2019-11-02 NOTE — Telephone Encounter (Signed)
This is a [facility] pt.  °

## 2019-11-06 DIAGNOSIS — H532 Diplopia: Secondary | ICD-10-CM | POA: Diagnosis not present

## 2019-11-06 DIAGNOSIS — H353132 Nonexudative age-related macular degeneration, bilateral, intermediate dry stage: Secondary | ICD-10-CM | POA: Diagnosis not present

## 2019-11-06 DIAGNOSIS — Z961 Presence of intraocular lens: Secondary | ICD-10-CM | POA: Diagnosis not present

## 2019-11-06 DIAGNOSIS — H43813 Vitreous degeneration, bilateral: Secondary | ICD-10-CM | POA: Diagnosis not present

## 2019-11-14 ENCOUNTER — Other Ambulatory Visit: Payer: Self-pay

## 2019-11-14 ENCOUNTER — Encounter: Payer: Self-pay | Admitting: Neurology

## 2019-11-14 ENCOUNTER — Ambulatory Visit (INDEPENDENT_AMBULATORY_CARE_PROVIDER_SITE_OTHER): Payer: Medicare Other | Admitting: Neurology

## 2019-11-14 VITALS — BP 118/84 | HR 59 | Ht 70.0 in | Wt 234.0 lb

## 2019-11-14 DIAGNOSIS — G4739 Other sleep apnea: Secondary | ICD-10-CM | POA: Diagnosis not present

## 2019-11-14 DIAGNOSIS — Z7189 Other specified counseling: Secondary | ICD-10-CM

## 2019-11-14 NOTE — Patient Instructions (Signed)

## 2019-11-14 NOTE — Progress Notes (Signed)
SLEEP MEDICINE CLINIC    Provider:  Larey Seat, MD  Primary Care Physician:  [name], MD [address] [place] [address]     Referring Provider: [name], [place],  [place] [address]          Chief Complaint according to patient   Patient presents with:     New Patient (Initial Visit)           HISTORY OF PRESENT ILLNESS:   Rv 11-14-2019-  Darlene [name] is a 79 year old white female patient seen here for a revisit  on 11/14/2019 ; I had ordered sleep studies after her last visit here with me which was actually her first and the patient presented on [id number] in lab sleep study which had been ordered requested by her primary care physician as well as by Dr. Bronson Ing her cardiologist at the time.  Patient has known atrial fibrillation congestive due to diastolic heart failure.  Apnea hypopnea index and that study was 11/h REM AHI was 19.9 non-REM AHI was 8.8 and in supine position while sleeping on her back, her AHI was 17.2 overall this was mild apnea but with the lack of oxygen desaturation time.  And in addition it appeared to be a classic apnea slightly more central events and obstructive.  For this reason we did not want to autotitrator also to return for CPAP titration.  That took place on 07-11-19 and here now the patient was put first exposed to CPAP but it did not work CPAP titration left her with a residual apnea index of 10.4 for that reason she had to be titrated to BiPAP CPAP highest pressure tried was 16 cmH2O.   On the BiPAP of 15 under 19 there was no reduction of the apnea either she still had 30 apneas per hour.  The technician gave her a Mirage Quatro mask and medium full facemask.  She remained was hypoxic oxygen saturation for much of the study diagnosis was severe central sleep apnea now treatment emerging failure to alleviate on the CPAP or BiPAP.  Foot attempts to control her apnea at  this time she was titrated to ASV which she responded to beautifully.  A very first sleep study had documented an AHI of 53.7 on May 24, 2019 her ASV study allowed an AHI of 0.0 apneas per hour on the a pressure of 10 cmH2O.   I have now seen her download- she has been placed on auto CPAP and is doing well, we will change the mask form a FFM ( ? Why was she given a FFM?) to the desired nasal mask. She would like an ESON medium mask.      Chief concern according to patient : " I was in hospital with atrial fib and low oxygen levels at night" .   I have the pleasure of seeing Darlene [name] today, a right -handed  female with a possible sleep disorder.  She has a  has a past medical history of Chronic lower back pain, Depression, DJD (degenerative joint disease), and GERD (gastroesophageal reflux disease), atrial fibrillation- newly diagnosed in [address].     Family medical /sleep history: both parents died young of heart disease.    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   Pets are present. Tobacco use: never .  ETOH use: 1 drink a week,  Caffeine intake in form of Coffee( 2 cups ) Soda( once a week) Tea ( none ) , and no  energy drinks. Regular exercise - not for a long time.    Hobbies : chicken     Sleep habits are as follows: The patient's dinner time is between 6.30 PM. The patient goes to bed at 9-9.45 PM and continues to sleep for 2-3  hours, wakes for several  bathroom breaks, the first time at 1 AM.  The bedroom is cool, quiet and dark.  The preferred sleep position is right sided, with the support of 1 pillow.  Dreams are reportedly frequent. 6 AM is the usual rise time.  The patient wakes up at 6 AM with an alarm. The great-grand babies arrive at 70 AM- 21 and 9 years old.  She reports not feeling refreshed  or restored in AM, with symptoms such as dry mouth,, and residual fatigue.  Naps are now  taken frequently, lasting from 30-60 minutes and are more refreshing than nocturnal sleep.    Review of Systems: Out of a complete 14 system review, the patient complains of only the following symptoms, and all other reviewed systems are negative.:  Fatigue, sleepiness , snoring, lightheadedness, near syncope. SOB . Tunnel vision," almost blacking out"    How likely are you to doze in the following situations: 0 = not likely, 1 = slight chance, 2 = moderate chance, 3 = high chance   Sitting and Reading? Watching Television? Sitting inactive in a public place (theater or meeting)? As a passenger in a car for an hour without a break? Lying down in the afternoon when circumstances permit? Sitting and talking to someone? Sitting quietly after lunch without alcohol? In a car, while stopped for a few minutes in traffic?   Total = 10-12 with a daily nap / 24 points   FSS endorsed at 00/ 63 points.  GDS 2/ 15 on paroxetine.   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  Tobacco Use   Smoking status: Never Smoker   Smokeless tobacco: Never Used  Scientific laboratory technician Use: Never used  Substance and Sexual Activity   Alcohol use: Yes    Alcohol/week: 3.0 standard drinks    Types: 3 Standard drinks or equivalent per week    Comment: 3 nights out of the week   Drug use: No   Sexual activity: Not Currently  Other Topics Concern   Not on file  Social History Narrative   Not on file   Social Determinants of Health   Financial Resource Strain: Low Risk    Difficulty of Paying Living Expenses: Not very hard  Food Insecurity: No Food Insecurity   Worried About  Running Out of Food in the Last Year: Never true   Ran Out of Food in the Last Year: Never true  Transportation Needs: No Transportation Needs   Lack of Transportation (Medical): No   Lack of Transportation (Non-Medical): No  Physical Activity: Inactive   Days of Exercise per Week: 0 days   Minutes of Exercise per Session:  0 min  Stress: No Stress Concern Present   Feeling of Stress : Only a little  Social Connections: Moderately Integrated   Frequency of Communication with Friends and Family: More than three times a week   Frequency of Social Gatherings with Friends and Family: More than three times a week   Attends Religious Services: 1 to 4 times per year   Active Member of [employer] or Organizations: Yes   Attends Archivist Meetings: More than 4 times per year   Marital Status: Widowed    Family History  Problem Relation Age of Onset   Hypertension Mother    Heart disease Mother    Hypertension Father    Heart disease Father    Tremor Sister    Dementia Brother    Hypertension Daughter    Colon cancer Neg Hx    Esophageal cancer Neg Hx    Rectal cancer Neg Hx    Stomach cancer Neg Hx     Past Medical History:  Diagnosis Date   Anxiety    Chronic lower back pain    Depression    DJD (degenerative joint disease)    knees, neck   GERD (gastroesophageal reflux disease)    has had dilitation in the past    Past Surgical History:  Procedure Laterality Date   ABDOMINAL HYSTERECTOMY  03/1979   BREAST BIOPSY Left 1986 and 1987   CATARACT EXTRACTION, BILATERAL Bilateral 2014   Dr. Madelin Headings, in [place]     Current Outpatient Medications on File Prior to Visit  Medication Sig Dispense Refill   acetaminophen (TYLENOL) 500 MG tablet Take 500 mg by mouth every 6 (six) hours as needed.     apixaban (ELIQUIS) 5 MG TABS tablet Take 1 tablet (5 mg total) by mouth 2 (two) times daily. 180 tablet 3   Ascorbic Acid (VITAMIN C) 500 MG  CAPS Take 500 mg by mouth.      CHOLECALCIFEROL PO Take 5,000 Units by mouth daily.     cyanocobalamin 1000 MCG tablet Take 1,000 mcg by mouth daily.     cyclobenzaprine (FLEXERIL) 10 MG tablet Take 1/2 to 1 tablet    2 to 3 x /day if needed for Muscle Spasms 90 tablet 0   diltiazem (CARDIZEM CD) 300 MG 24 hr capsule Take 1 capsule by mouth once daily 30 capsule 6   ferrous sulfate 325 (65 FE) MG tablet Take 1 tablet (325 mg total) by mouth daily with breakfast.  3   lansoprazole (PREVACID) 30 MG capsule Take 1 capsule Daily for Heartburn & Indigestion 90 capsule 3   metoprolol tartrate (LOPRESSOR) 25 MG tablet Take 50 mg( 2 Tablets )  in the Morning and Take 25 mg ( 1 Tablet)  in the Evening 90 tablet 11   PARoxetine (PAXIL) 20 MG tablet Take 1 tablet Daily for Mood 90 tablet 0   Probiotic Product (PROBIOTIC DAILY PO) Take 1 tablet by mouth daily.     zinc gluconate 50 MG tablet Take 50 mg by mouth daily.     No current facility-administered medications on file prior to visit.    Allergies  Allergen Reactions   Cephalosporins Rash    Physical exam:  Today's Vitals   11/14/19 1555  BP: 118/84  Pulse: (!) 59  Weight: 234 lb (106.1 kg)  Height: 5\' 10"  (1.778 m)   Body mass index is 33.58 kg/m.   Wt Readings from Last 3 Encounters:  11/14/19 234 lb (106.1 kg)  10/10/19 (!) 233 lb 12.8 oz (106.1 kg)  09/08/19 225 lb (102.1 kg)     Ht Readings from Last 3 Encounters:  11/14/19 5\' 10"  (1.778 m)  10/10/19 5\' 10"  (1.778 m)  09/08/19 5\' 10"  (1.778 m)      General: The patient is awake, alert and appears not in acute distress.  The patient is groomed. Head: Normocephalic, atraumatic. Neck is supple. Mallampati 3,  neck circumference:15. 25 inches . Nasal airflow patent.  Retrognathia is not seen.  Dental status: native  Cardiovascular:  irregular  rate and cardiac rhythm by pulse, but  without distended neck veins. Respiratory: Lungs are clear to auscultation.    Skin:  Without evidence of ankle edema (!), or rash. Trunk: The patient's posture is erect.   Neurologic exam : The patient is awake and alert, oriented to place and time.   Memory subjective described as intact.  Attention span & concentration ability appears normal.  Speech is fluent,  without  dysarthria, dysphonia or aphasia.  Mood and affect are appropriate.   Cranial nerves: no loss of smell or taste reported  Pupils are equal and briskly reactive to light. Funduscopic exam deferred.  Status post cataract/  Extraocular movements in vertical and horizontal planes were intact and without nystagmus. No Diplopia. Visual fields by finger perimetry are intact. Hearing was intact to soft voice and finger rubbing.    Facial sensation intact to fine touch.  Facial motor strength is symmetric and tongue and uvula move midline.  Neck ROM : rotation, tilt and flexion extension were normal for age and shoulder shrug was symmetrical.    Motor exam:  Symmetric bulk, tone and ROM.   Normal tone without cog wheeling, symmetric grip strength .   Sensory:  Fine touch, pinprick and vibration were tested  and  normal.  Proprioception tested in the upper extremities was normal.   Coordination: Rapid alternating movements in the fingers/hands were of normal speed.  The Finger-to-nose maneuver was intact without evidence of ataxia, dysmetria or tremor.  Gait and station: Patient in wheelchair  Toe and heel walk were deferred.  Deep tendon reflexes: in the  upper and lower extremities are symmetric and intact.  Babinski response was deferred .  After spending a total time of 20 minutes face to face and additional time for physical and neurologic examination, review of laboratory studies,  personal review of imaging studies, reports and results of other testing and review of referral information / records as far as provided in visit, I have established the following assessments:  1)  Orthostaic  hypotension and near syncope.  Given Mrs. Bjelland's place of residence and her ambulatory difficulties with presyncope I would much prefer to do an at-home home sleep study to screen for apnea.   2) She was observed in hospital having low oxygen saturations, rapid ventricular response. AN  In- lab- PSG  study would give me more data that would certainly be helpful, but  I think we know that the patient has atrial fibrillation therefore a home sleep test may be enough.  CPAP worked well at around 10 cm water- she will continue to use auto CPAP but setting will be increased to 7-17 cm water  Keep 3 cm EPR and use an ESON mask.  I would like to thank [name], [place],  [place] [address] for allowing me to meet with and to take care of this pleasant patient.    I plan  to follow upthrough our NP within 6 month and yearly thereafter.   CC: I will share my notes with PCP.  Electronically signed by: Larey Seat, MD 11/14/2019 4:20 PM  [facility] Board certified by The [employer] of Sleep Medicine and Diplomate of the [employer] of Sleep Medicine. Board certified In Neurology through the [place], Fellow of the [employer] of Neurology. Medical Director of [facility].

## 2019-11-15 IMAGING — CR DG CHEST 2V
3 series · 3 of 3 positions shown · non-contrast
Comparison: None

CLINICAL DATA: Cough and runny nose

EXAM:
CHEST  2 VIEW

[chest pa (1 of 2)]
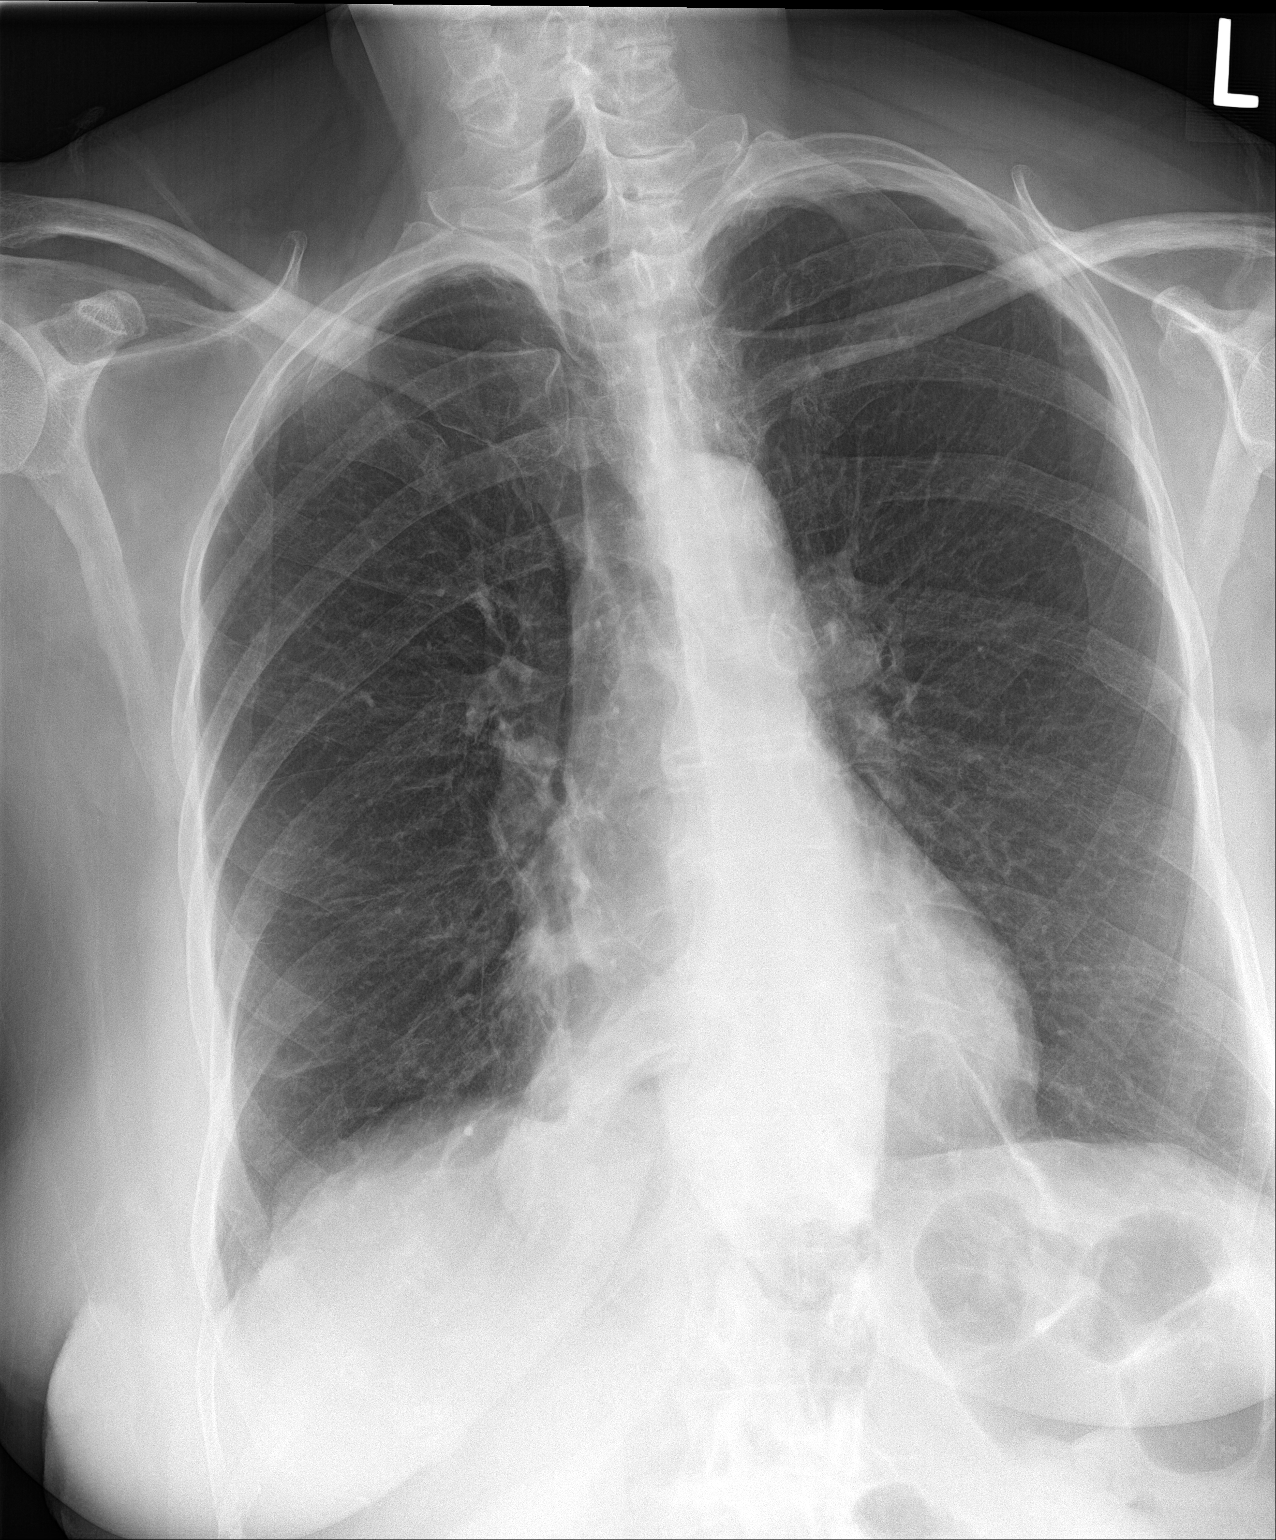

[chest lat]
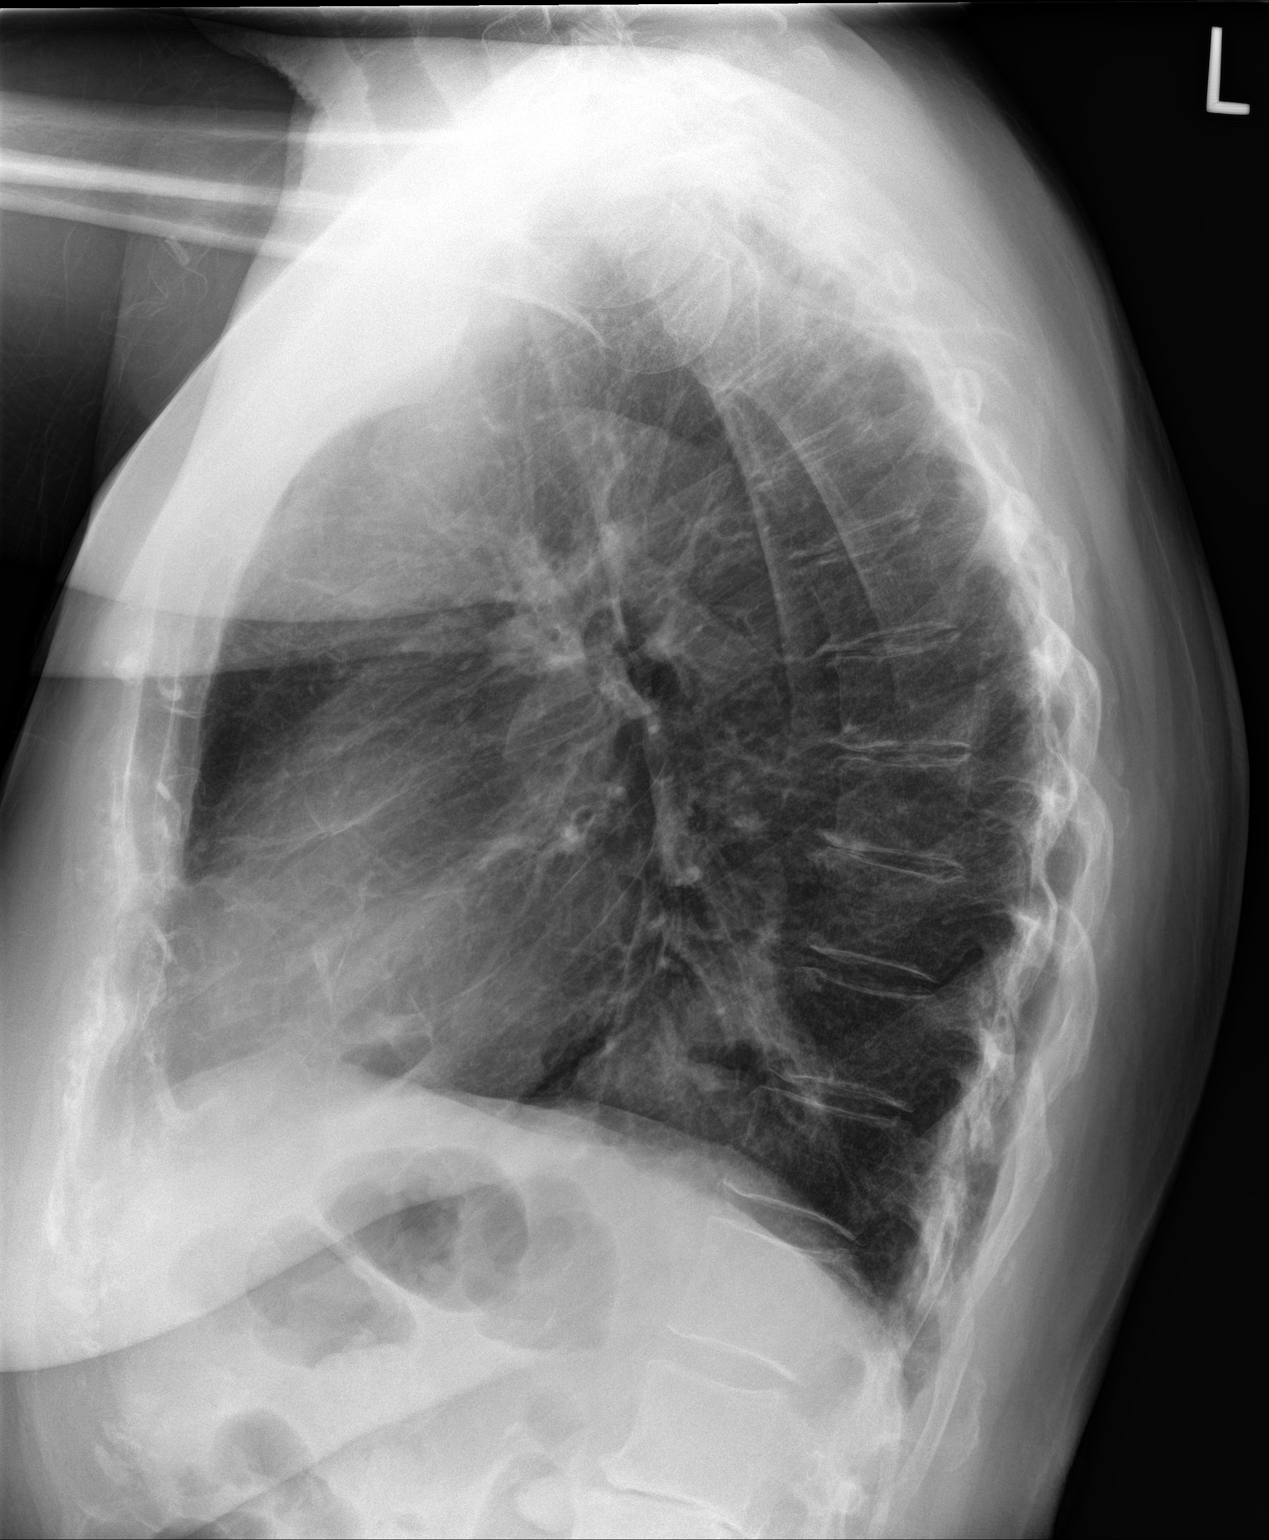

[chest pa (2 of 2)]
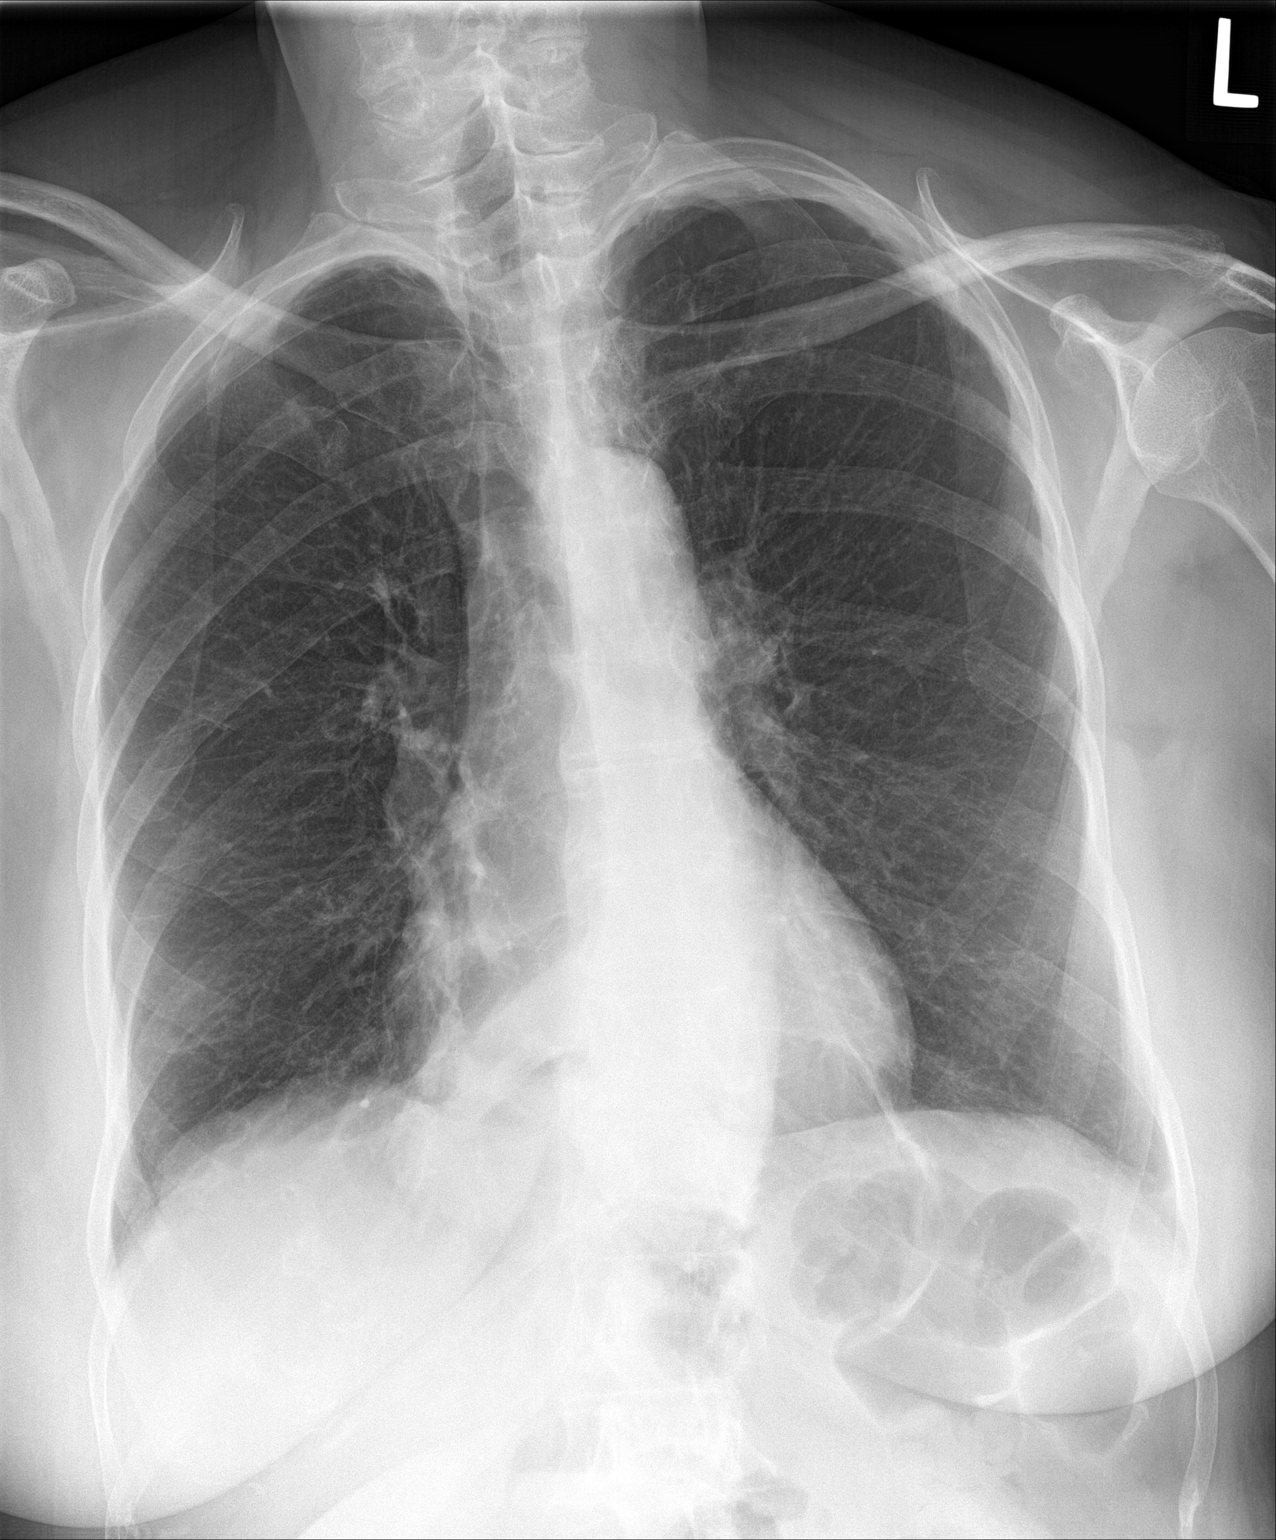

[3 of 3 positions shown; findings below may reference images not displayed]

FINDINGS: Normal heart size. Moderate size hiatal hernia. No pleural effusion
or edema. No airspace opacities identified. The visualized skeletal
structures are unremarkable.
IMPRESSION: 1. No acute findings.
2. Hiatal hernia.

## 2019-11-16 ENCOUNTER — Other Ambulatory Visit: Payer: Self-pay | Admitting: Pharmacy Technician

## 2019-11-16 NOTE — Patient Outreach (Signed)
[facility]) Care Management  11/16/2019  Darlene [name] 1940/08/28 [id number]    Return call placed to patient regarding patient assistance application(s) for Eliquis with [employer] , HIPAA identifiers verified.   Spoke to patient's daughter Darlene [name] and HIPAA was confirmed and verified. Darlene [name] informed patient was unable to find her insurance cards. Informed Darlene [name] that her provider's had scanned in a copy so I would be able to obtain and use those copies. She also informed she had included the 1040 and a [employer] printout to meet the OOP requirement. She informed she would place in the envelope that I included and mail back to [facility].  Follow up:  WIll await return of documents.  Darlene [name] P. Darlene [name], [place]  [phone]

## 2019-11-21 ENCOUNTER — Ambulatory Visit (INDEPENDENT_AMBULATORY_CARE_PROVIDER_SITE_OTHER): Payer: Medicare Other

## 2019-11-21 ENCOUNTER — Other Ambulatory Visit: Payer: Self-pay

## 2019-11-21 DIAGNOSIS — N3000 Acute cystitis without hematuria: Secondary | ICD-10-CM | POA: Diagnosis not present

## 2019-11-21 NOTE — Progress Notes (Signed)
Recheck urine   LABS ENTERED IN BY PROVIDER AS WELL

## 2019-11-22 LAB — URINALYSIS, ROUTINE W REFLEX MICROSCOPIC
Bilirubin Urine: NEGATIVE
Glucose, UA: NEGATIVE
Hgb urine dipstick: NEGATIVE
Ketones, ur: NEGATIVE
Leukocytes,Ua: NEGATIVE
Nitrite: NEGATIVE
Protein, ur: NEGATIVE
Specific Gravity, Urine: 1.008 (ref 1.001–1.03)
pH: 6.5 (ref 5.0–8.0)

## 2019-11-22 LAB — URINE CULTURE
MICRO NUMBER:: 10918001
SPECIMEN QUALITY:: ADEQUATE

## 2019-11-24 ENCOUNTER — Other Ambulatory Visit: Payer: Self-pay | Admitting: Pharmacy Technician

## 2019-11-24 NOTE — Progress Notes (Signed)
<><><><><><><><><><><><><><><><><><><><><><><><><><><><><><><><>< ?<><><><><><><><><><><><><><><><><><><><><><><><><><><><><><><><>< ? ?-   U/C - returned OK - No Infection ? ?<><><><><><><><><><><><><><><><><><><><><><><><><><><><><><><><>< ?<><><><><><><><><><><><><><><><><><><><><><><><><><><><><><><><>< ? ? ? ?

## 2019-11-24 NOTE — Patient Outreach (Signed)
[facility]) Care Management  11/24/2019  [name] 07-27-1940 [id number]   Received both patient and provider portion(s) of patient assistance application(s) for Eliquis. Faxed completed application and required documents into BMS.  Will follow up with company(ies) in 5-10 business days to check status of application(s).  [name], [place]  [phone]

## 2019-11-27 ENCOUNTER — Ambulatory Visit: Payer: Medicare Other | Admitting: Cardiovascular Disease

## 2019-11-28 ENCOUNTER — Other Ambulatory Visit: Payer: Self-pay | Admitting: Pharmacy Technician

## 2019-11-28 ENCOUNTER — Telehealth: Payer: Self-pay | Admitting: *Deleted

## 2019-11-28 NOTE — Telephone Encounter (Signed)
Daughter is aware of negative urine culture.

## 2019-11-28 NOTE — Patient Outreach (Signed)
[facility]) Care Management  11/28/2019  MAKARI SANKO 06-16-1940 [id number]   Care coordination call placed to [employer] in regards to patient's Eliquis application.  Spoke to [facility] who informed patient was APPROVED 11/28/2019-03/15/2020. She informed the patient should receive a call in the next few days to schedule shipment.  Will outreach patient to be expecting this phone call and to notify of approval.  Macarthur Lorusso P. Srikar Chiang, [place]  [phone]

## 2019-11-28 NOTE — Patient Outreach (Signed)
[facility]) Care Management  11/28/2019  DORELLA LASTER December 10, 1940 [id number]   ADDENDUM  Successful outreach call placed to patient's daughter in regards to BMS application for Eliquis.  Spoke to [place], HIPAA identifiers verified.  Informed [name] that patient had been approved for the patient assistance program. Informed her that the patient should be receiving a phone call in the next few days to set up shipment. Informed her if she has not heard from them by Friday then she could contact me back and I could provide a phone number for patient to call. [name] verbalized understanding.  Will follow up with patient in 7-10 business days to inquire if medication was received.  Bryahna Lesko P. Charyl Minervini, [place]  [phone]

## 2019-12-04 ENCOUNTER — Encounter: Payer: Self-pay | Admitting: *Deleted

## 2019-12-04 ENCOUNTER — Other Ambulatory Visit: Payer: Self-pay | Admitting: Internal Medicine

## 2019-12-04 ENCOUNTER — Ambulatory Visit (INDEPENDENT_AMBULATORY_CARE_PROVIDER_SITE_OTHER): Payer: Medicare Other | Admitting: Cardiology

## 2019-12-04 ENCOUNTER — Other Ambulatory Visit: Payer: Self-pay

## 2019-12-04 ENCOUNTER — Encounter: Payer: Self-pay | Admitting: Cardiology

## 2019-12-04 VITALS — BP 138/80 | HR 74 | Ht 70.0 in | Wt 238.2 lb

## 2019-12-04 DIAGNOSIS — Z23 Encounter for immunization: Secondary | ICD-10-CM

## 2019-12-04 DIAGNOSIS — I4819 Other persistent atrial fibrillation: Secondary | ICD-10-CM | POA: Diagnosis not present

## 2019-12-04 DIAGNOSIS — R079 Chest pain, unspecified: Secondary | ICD-10-CM

## 2019-12-04 NOTE — Patient Instructions (Addendum)
Medication Instructions:   Your physician recommends that you continue on your current medications as directed. Please refer to the Current Medication list given to you today.  Labwork:  None  Testing/Procedures: Your physician has requested that you have a lexiscan myoview. For further information please visit [email or web address]. Please follow instruction sheet, as given.  Follow-Up:  Your physician recommends that you schedule a follow-up appointment in: 6 weeks.  Any Other Special Instructions Will Be Listed Below (If Applicable).  If you need a refill on your cardiac medications before your next appointment, please call your pharmacy.

## 2019-12-04 NOTE — Progress Notes (Signed)
Clinical Summary Ms. Pho is a 79 y.o.female seen today for follow up of the following medical problems.   1. Afib - due to LAE rate control strategy has been pursued - compliant with meds - no bleeding on eliquis.     2. Chest pain - occurs with activity. Feeling of weight on chest. Mid chest, 8/10 in severity. Stops and rest, symptoms resolve    3. Pulmonary HTN - PFTs were benign - sleep study did show severe OSA, likely etiology. COmpliant with cpap  3. Generalized fatigue   Past Medical History:  Diagnosis Date  . Anxiety   . Chronic lower back pain   . Depression   . DJD (degenerative joint disease)    knees, neck  . GERD (gastroesophageal reflux disease)    has had dilitation in the past     Allergies  Allergen Reactions  . Cephalosporins Rash     Current Outpatient Medications  Medication Sig Dispense Refill  . acetaminophen (TYLENOL) 500 MG tablet Take 500 mg by mouth every 6 (six) hours as needed.    Marland Kitchen apixaban (ELIQUIS) 5 MG TABS tablet Take 1 tablet (5 mg total) by mouth 2 (two) times daily. 180 tablet 3  . Ascorbic Acid (VITAMIN C) 500 MG CAPS Take 500 mg by mouth.     . CHOLECALCIFEROL PO Take 5,000 Units by mouth daily.    . cyanocobalamin 1000 MCG tablet Take 1,000 mcg by mouth daily.    . cyclobenzaprine (FLEXERIL) 10 MG tablet Take 1/2 to 1 tablet    2 to 3 x /day if needed for Muscle Spasms 90 tablet 0  . diltiazem (CARDIZEM CD) 300 MG 24 hr capsule Take 1 capsule by mouth once daily 30 capsule 6  . ferrous sulfate 325 (65 FE) MG tablet Take 1 tablet (325 mg total) by mouth daily with breakfast.  3  . lansoprazole (PREVACID) 30 MG capsule Take 1 capsule Daily for Heartburn & Indigestion 90 capsule 3  . metoprolol tartrate (LOPRESSOR) 25 MG tablet Take 50 mg( 2 Tablets )  in the Morning and Take 25 mg ( 1 Tablet)  in the Evening 90 tablet 11  . PARoxetine (PAXIL) 20 MG tablet Take 1 tablet Daily for Mood 90 tablet 0  . Probiotic Product  (PROBIOTIC DAILY PO) Take 1 tablet by mouth daily.    Marland Kitchen zinc gluconate 50 MG tablet Take 50 mg by mouth daily.     No current facility-administered medications for this visit.     Past Surgical History:  Procedure Laterality Date  . ABDOMINAL HYSTERECTOMY  03/1979  . BREAST BIOPSY Left 1986 and 1987  . CATARACT EXTRACTION, BILATERAL Bilateral 2014   Dr. Madelin Headings, in [facility]     Allergies  Allergen Reactions  . Cephalosporins Rash      Family History  Problem Relation Age of Onset  . Hypertension Mother   . Heart disease Mother   . Hypertension Father   . Heart disease Father   . Tremor Sister   . Dementia Brother   . Hypertension Daughter   . Colon cancer Neg Hx   . Esophageal cancer Neg Hx   . Rectal cancer Neg Hx   . Stomach cancer Neg Hx      Social History Ms. Remund reports that she has never smoked. She has never used smokeless tobacco. Ms. Vaccaro reports current alcohol use of about 3.0 standard drinks of alcohol per week.   Review of Systems  CONSTITUTIONAL: No weight loss, fever, chills, weakness or fatigue.  HEENT: Eyes: No visual loss, blurred vision, double vision or yellow sclerae.No hearing loss, sneezing, congestion, runny nose or sore throat.  SKIN: No rash or itching.  CARDIOVASCULAR: per hpi RESPIRATORY: No shortness of breath, cough or sputum.  GASTROINTESTINAL: No anorexia, nausea, vomiting or diarrhea. No abdominal pain or blood.  GENITOURINARY: No burning on urination, no polyuria NEUROLOGICAL: No headache, dizziness, syncope, paralysis, ataxia, numbness or tingling in the extremities. No change in bowel or bladder control.  MUSCULOSKELETAL: No muscle, back pain, joint pain or stiffness.  LYMPHATICS: No enlarged nodes. No history of splenectomy.  PSYCHIATRIC: No history of depression or anxiety.  ENDOCRINOLOGIC: No reports of sweating, cold or heat intolerance. No polyuria or polydipsia.  Marland Kitchen   Physical Examination Today's Vitals    12/04/19 0816  BP: 138/80  Pulse: 74  SpO2: 96%  Weight: 238 lb 3.2 oz (108 kg)  Height: 5\' 10"  (1.778 m)   Body mass index is 34.18 kg/m.  Gen: resting comfortably, no acute distress HEENT: no scleral icterus, pupils equal round and reactive, no palptable cervical adenopathy,  CV: irreg, no m/r/g, no jvd Resp: Clear to auscultation bilaterally GI: abdomen is soft, non-tender, non-distended, normal bowel sounds, no hepatosplenomegaly MSK: extremities are warm, no edema.  Skin: warm, no rash Neuro:  no focal deficits Psych: appropriate affect   Diagnostic Studies  Echo 04/16/19 IMPRESSIONS    1. Left ventricular ejection fraction, by visual estimation, is 55 to  60%. The left ventricle has normal function. There is mildly increased  left ventricular hypertrophy.  2. Left ventricular diastolic parameters are indeterminate.  3. The left ventricle has no regional wall motion abnormalities.  4. Global right ventricle has normal systolic function.The right  ventricular size is moderately enlarged. No increase in right ventricular  wall thickness.  5. Left atrial size was severely dilated.  6. Right atrial size was moderately dilated.  7. The mitral valve is normal in structure. Mild mitral valve  regurgitation. No evidence of mitral stenosis.  8. The tricuspid valve is normal in structure.  9. The tricuspid valve is normal in structure. Tricuspid valve  regurgitation is mild.  10. The aortic valve is tricuspid. Aortic valve regurgitation is not  visualized. No evidence of aortic valve sclerosis or stenosis.  11. The pulmonic valve was not well visualized. Pulmonic valve  regurgitation is not visualized.  12. Moderately elevated pulmonary artery systolic pressure.  13. The inferior vena cava is dilated in size with <50% respiratory  variability, suggesting right atrial pressure of 15 mmHg.    Assessment and Plan   1. Afib - no recent symptoms, continue current  meds  2. Chest pain - will obtain lexiscan to further evaluate      Arnoldo Lenis, M.D.

## 2019-12-06 ENCOUNTER — Other Ambulatory Visit: Payer: Self-pay | Admitting: Pharmacy Technician

## 2019-12-06 NOTE — Patient Outreach (Signed)
[facility] [facility]) Care Management  12/06/2019  Darlene [name] 14-Feb-1941 [id number]   Successful call placed to patient's daughter [name] regarding patient assistance medication delivery of Eliquis from [employer], HIPAA identifiers verified.   [name] informed her mother had received the Eliquis. Discussed refill procedure with [name]. Informed her the medication should auto ship should she need a refill before the enrollment date ends on 03/15/2020. Informed [name] that if they had not heard from the company by the end of December, then she may want to outreach [employer] or [employer] to request if they will send another refill. [name] verbalized understanding. Confirmed she had name and number as she had no other questions or concerns.  Follow up:  Will route note to [facility] for case closure as patient assistance has been completed.  Gevon Markus P. Abdurahman Rugg, [place]  [phone]

## 2019-12-08 ENCOUNTER — Other Ambulatory Visit: Payer: Self-pay

## 2019-12-08 ENCOUNTER — Encounter (HOSPITAL_COMMUNITY): Payer: Self-pay

## 2019-12-08 ENCOUNTER — Encounter (HOSPITAL_COMMUNITY)
Admission: RE | Admit: 2019-12-08 | Discharge: 2019-12-08 | Disposition: A | Discharge status: Home / Self Care | Payer: Medicare Other | Source: Ambulatory Visit | Attending: Cardiology | Admitting: Cardiology

## 2019-12-08 ENCOUNTER — Encounter (HOSPITAL_BASED_OUTPATIENT_CLINIC_OR_DEPARTMENT_OTHER)
Admission: RE | Admit: 2019-12-08 | Discharge: 2019-12-08 | Disposition: A | Discharge status: Home / Self Care | Payer: Medicare Other | Source: Ambulatory Visit | Attending: Cardiology | Admitting: Cardiology

## 2019-12-08 DIAGNOSIS — R079 Chest pain, unspecified: Secondary | ICD-10-CM

## 2019-12-08 HISTORY — DX: Essential (primary) hypertension: I10

## 2019-12-08 LAB — NM MYOCAR MULTI W/SPECT W/WALL MOTION / EF
LV dias vol: 102 mL (ref 46–106)
LV sys vol: 38 mL
Peak HR: 87 {beats}/min
RATE: 0.42
Rest HR: 74 {beats}/min
SDS: 1
SRS: 0
SSS: 1
TID: 1.16

## 2019-12-08 MED ORDER — TECHNETIUM TC 99M TETROFOSMIN IV KIT
10.0000 | PACK | Freq: Once | INTRAVENOUS | Status: AC | PRN
  Administered 2019-12-08: 10.6 via INTRAVENOUS

## 2019-12-08 MED ORDER — REGADENOSON 0.4 MG/5ML IV SOLN
INTRAVENOUS | Status: AC
  Administered 2019-12-08: 0.4 mg via INTRAVENOUS
  Filled 2019-12-08: qty 5

## 2019-12-08 MED ORDER — TECHNETIUM TC 99M TETROFOSMIN IV KIT
30.0000 | PACK | Freq: Once | INTRAVENOUS | Status: AC | PRN
  Administered 2019-12-08: 32 via INTRAVENOUS

## 2019-12-08 MED ORDER — SODIUM CHLORIDE FLUSH 0.9 % IV SOLN
INTRAVENOUS | Status: AC
  Administered 2019-12-08: 10 mL via INTRAVENOUS
  Filled 2019-12-08: qty 10

## 2019-12-12 ENCOUNTER — Other Ambulatory Visit: Payer: Self-pay | Admitting: *Deleted

## 2019-12-12 NOTE — Patient Outreach (Signed)
[facility] [facility]) Care Management  12/12/2019  Darlene [name] 07/22/1940 [id number]  Quarterly follow up for AFIB  Unsuccessful call, vm not set up. Sent text to respond for pt update.  Daughter, [name], returned my call and advised her mother is doing well.  1)She has had her eye exam and has new glasses. 2)She got her Eliquis through the end of the year. 3)She has received her CPAP equipment and is using it.  She had a cardiac profusion test recently but the results have not been reported.  We agreed to talk again in 3 months.  [name]. [name], MSN, [facility] Gerontological Nurse Practitioner [facility] Care Management [phone]

## 2019-12-13 ENCOUNTER — Ambulatory Visit: Payer: Medicare Other | Admitting: *Deleted

## 2019-12-15 ENCOUNTER — Telehealth: Payer: Self-pay | Admitting: Student

## 2019-12-15 NOTE — Telephone Encounter (Signed)
Study Result  Narrative & Impression   There was no ST segment deviation noted during stress.  The study is normal. There are no perfusion defect consistentw with prior infarct or current ischemia.  This is a low risk study.  The left ventricular ejection fraction is normal (55-65%).   Patient and daughter notified of results, copied pcp

## 2019-12-15 NOTE — Telephone Encounter (Signed)
Per Daughter Pt requesting Stress Test results    Please call Shermon Bozzi-Daughter [phone]   thanks renee

## 2019-12-20 ENCOUNTER — Telehealth: Payer: Self-pay | Admitting: *Deleted

## 2019-12-20 NOTE — Telephone Encounter (Signed)
-----   Message from Arnoldo Lenis, MD sent at 12/20/2019  3:53 PM EDT ----- Normal stress test, no signis of any blockages in the heart   Zandra Abts MD

## 2019-12-27 NOTE — Telephone Encounter (Signed)
Pt voiced understanding

## 2020-01-02 ENCOUNTER — Other Ambulatory Visit: Payer: Self-pay

## 2020-01-02 DIAGNOSIS — Z1212 Encounter for screening for malignant neoplasm of rectum: Secondary | ICD-10-CM

## 2020-01-02 DIAGNOSIS — Z1211 Encounter for screening for malignant neoplasm of colon: Secondary | ICD-10-CM

## 2020-01-02 LAB — POC HEMOCCULT BLD/STL (HOME/3-CARD/SCREEN)
Card #2 Fecal Occult Blod, POC: NEGATIVE
Card #3 Fecal Occult Blood, POC: NEGATIVE
Fecal Occult Blood, POC: NEGATIVE

## 2020-01-03 DIAGNOSIS — Z1212 Encounter for screening for malignant neoplasm of rectum: Secondary | ICD-10-CM | POA: Diagnosis not present

## 2020-01-03 DIAGNOSIS — Z1211 Encounter for screening for malignant neoplasm of colon: Secondary | ICD-10-CM | POA: Diagnosis not present

## 2020-01-05 ENCOUNTER — Other Ambulatory Visit: Payer: Self-pay | Admitting: Adult Health

## 2020-01-05 DIAGNOSIS — Z1231 Encounter for screening mammogram for malignant neoplasm of breast: Secondary | ICD-10-CM

## 2020-01-12 DIAGNOSIS — I272 Pulmonary hypertension, unspecified: Secondary | ICD-10-CM | POA: Insufficient documentation

## 2020-01-12 NOTE — Progress Notes (Signed)
MEDICARE WELLNESS  Assessment and Plan:   A. Fib with RVR ([place]) Rate controlled on current agents; on elequis without bleeding concerns Cardiology following;   Pulmonary hypertension ([place]) Normal PFTs; abnormal sleep study; now on CPAP  Central sleep apnea associated with a. Fib ([place]) Dr. Brett Fairy following; on auto CPAP with good results; endorses 100% compliance  Morbid obesity ([place]) - BMI 30+ with sleep apnea Long discussion about weight loss, diet, and exercise Recommended diet heavy in fruits and veggies and low in animal meats, cheeses, and dairy products, appropriate calorie intake Discussed appropriate weight for height  She is poorly motivated to work on weight , but agreeable to making small changes; discussed portions for cereal, do more protein/veggie leftovers, alternate with sandwich/chips, portions for ice cream  Weigh once a week and keep log Follow up at next visit  Cholesterol Moderate elevations; low risk history; currently managed by lifestyle  Continue low cholesterol diet and exercise.  Check lipid panel.   Recurrent major depressive disorder, in full remission ([place]) Continue medications - paxil Lifestyle discussed: diet/exerise, sleep hygiene, stress management, hydration  Anemia, iron def Improved with supplement; has reduced to every other day Iron def chronic recurrent; did have reassuring EGD/colonoscopy She is on PPI; possible inadequate absorption; she is taking with vit C Plan to continue lower maintenance iron indefinitely with regular monitoring of iron/ferritin  Vitamin D deficiency -     Continue supplement  Unsteady gait/decompensation/increased risk injury with falls Now on elequis, very sedentary with increased falls/near falls She does not drive Discussed and receptive to home PT for balance, endurance and fall prevention  Discussed med's effects and SE's. Screening labs and tests as requested with regular follow-up as  recommended. Over 30 minutes of exam, counseling, chart review, and complex, high level critical decision making was performed this visit.   Future Appointments  Date Time Provider [place]  02/05/2020  1:30 PM Erma Heritage, PA-C [facility] [place] [facility]  02/07/2020  1:40 PM GI-BCG MM 3 GI-BCGMM GI-BREAST CE  03/06/2020 12:00 PM Deloria Lair, NP THN-CCC None  04/03/2020 10:00 AM [name], NP GAAM-GAAIM None  04/22/2020  4:00 PM [name], MD GAAM-GAAIM None  05/20/2020  3:30 PM [name], NP GNA-GNA None  10/10/2020 11:00 AM [name], MD GAAM-GAAIM None    HPI  79 y.o. female  presents for 3 months follow up on hyperlipidemia, vit D def, morbid obesity, OSA, a. Fib.   Patient was hospitalized in Jan/Feb with new onset Afib with RVR, LVH on ECHO, EF 55-60%, now on oral diltiazem, lopressor (50 mg AM, 25 mg PM), elequis 5 mg BID. Dr. Harl Bowie is following. Recently reported exertional chest pain, had low risk NM study. She did have sleep study by Dr. Brett Fairy this year demonstrating central sleep apnea, now on Auto CPAP and does endorse 100% compliance.   She was fairly sedentary at baseline, but reports since admission has done minimal walking, no energy, reports has had several near falls and 1-2 falls though without significant injury.    She has been on paxil for depression for many years and doing well.   She lives with her daughter and family, they drive her when needed. Didn't want to bother getting new driver's license. She watches her 2 young great grandchildren.   She has incontinence, has tried oxybutynin and myrbetriq that did not help. Wears depends. Admits restricts fluids, typically ~16 fluid ounces of water/day. Some coffee and orange juice.   BMI  is Body mass index is 34.47 kg/m., she has not been working on diet, but does state eating better since living with daughter. Until today has been resistant  Breakfast: 10 oz black coffee, thin  sliced white bread, ham/mayo, or cereal - honey bunches of oats (1-1.5 cup, 2% milk, may add fruit) Late lunch at 2 pm: sandwich and chips Dinner: protein with some veggies and ice cream  Fluids: mostly water,  Wt Readings from Last 3 Encounters:  01/15/20 240 lb 3.2 oz (109 kg)  12/04/19 238 lb 3.2 oz (108 kg)  11/21/19 235 lb (106.6 kg)   Today their BP is BP: 112/76  She does not workout. She denies chest pain, shortness of breath, dizziness.   She is not on cholesterol medication, reports hx of myalgias with unknown medication at one point, declines medications. Her cholesterol is at goal recently. The cholesterol last visit was:   Lab Results  Component Value Date   CHOL 176 10/10/2019   HDL 66 10/10/2019   LDLCALC 95 10/10/2019   TRIG 66 10/10/2019   CHOLHDL 2.7 10/10/2019    She has not been working on diet and exercise for glucose management.  Last A1C in the office was:  Lab Results  Component Value Date   HGBA1C 5.8 (H) 10/10/2019    Last GFR:  Lab Results  Component Value Date   GFRNONAA 73 10/10/2019   Patient is on Vitamin D supplement.   Lab Results  Component Value Date   VD25OH 62 10/10/2019     She reports long history of chronic intermittent iron def anemia, was restarted on iron this year, recently reduced down to every other day due to improved CBCs and also reports constipation; she did have EGD/colonoscopy 06/07/2017 without bleeding concerns;  Lab Results  Component Value Date   WBC 6.4 10/10/2019   HGB 13.3 10/10/2019   HCT 39.3 10/10/2019   MCV 96.1 10/10/2019   PLT 196 10/10/2019   Lab Results  Component Value Date   IRON 144 10/10/2019   TIBC CANCELED 10/10/2019   FERRITIN 5 (L) 04/03/2019      Current Medications:    Current Outpatient Medications (Cardiovascular):    diltiazem (CARDIZEM CD) 300 MG 24 hr capsule, Take 1 capsule by mouth once daily   metoprolol tartrate (LOPRESSOR) 25 MG tablet, Take 50 mg( 2 Tablets )  in the  Morning and Take 25 mg ( 1 Tablet)  in the Evening   Current Outpatient Medications (Analgesics):    acetaminophen (TYLENOL) 500 MG tablet, Take 500 mg by mouth every 6 (six) hours as needed.  Current Outpatient Medications (Hematological):    apixaban (ELIQUIS) 5 MG TABS tablet, Take 1 tablet (5 mg total) by mouth 2 (two) times daily.   cyanocobalamin 1000 MCG tablet, Take 1,000 mcg by mouth daily.   ferrous sulfate 325 (65 FE) MG tablet, Take 1 tablet (325 mg total) by mouth daily with breakfast. (Patient taking differently: Take 325 mg by mouth every other day. )  Current Outpatient Medications (Other):    Ascorbic Acid (VITAMIN C) 500 MG CAPS, Take 500 mg by mouth.    CHOLECALCIFEROL PO, Take 5,000 Units by mouth daily.   cyclobenzaprine (FLEXERIL) 10 MG tablet, Take 1/2 to 1 tablet    2 to 3 x /day if needed for Muscle Spasms   lansoprazole (PREVACID) 30 MG capsule, Take 1 capsule Daily for Heartburn & Indigestion   PARoxetine (PAXIL) 20 MG tablet, Take 1 tablet Daily for  Mood   Probiotic Product (PROBIOTIC DAILY PO), Take 1 tablet by mouth daily.   zinc gluconate 50 MG tablet, Take 50 mg by mouth daily.  Allergies:  Allergies  Allergen Reactions   Cephalosporins Rash     Medical History:  She has DJD (degenerative joint disease); Chronic lower back pain; Depression; GERD (gastroesophageal reflux disease); Morbid obesity ([place]) - BMI 30+ with OSA; History of colon polyps; Abnormal glucose; Hyperlipidemia, mixed; Vitamin D deficiency; Continuous leakage of urine; Esophageal stricture; Iron deficiency anemia; Atrial fibrillation with RVR ([place]); Hypoxemia requiring supplemental oxygen; Snoring; LVH (left ventricular hypertrophy) due to hypertensive disease, without heart failure; Severe obstructive sleep apnea-hypopnea syndrome; Central sleep apnea associated with atrial fibrillation ([place]); and Pulmonary hypertension ([place]) on their problem list.   Surgical History:  She has  a past surgical history that includes Abdominal hysterectomy (03/1979); Cataract extraction, bilateral (Bilateral, 2014); and Breast biopsy (Left, 1986 and 1987). Family History:  Herfamily history includes Dementia in her brother; Heart disease in her father and mother; Hypertension in her daughter, father, and mother; Tremor in her sister. Social History:  She reports that she has never smoked. She has never used smokeless tobacco. She reports current alcohol use of about 3.0 standard drinks of alcohol per week. She reports that she does not use drugs.   Review of Systems  Constitutional: Positive for malaise/fatigue. Negative for weight loss.  HENT: Negative for hearing loss and tinnitus.   Eyes: Negative for blurred vision and double vision.  Respiratory: Negative for cough, shortness of breath and wheezing.   Cardiovascular: Negative for chest pain, palpitations, orthopnea, claudication and leg swelling.  Gastrointestinal: Negative for abdominal pain, blood in stool, constipation, diarrhea, heartburn, melena, nausea and vomiting.  Genitourinary: Negative.   Musculoskeletal: Positive for falls. Negative for joint pain and myalgias.  Skin: Negative for rash.  Neurological: Negative for dizziness, tingling, sensory change, weakness and headaches.  Endo/Heme/Allergies: Negative for polydipsia.  Psychiatric/Behavioral: Negative.   All other systems reviewed and are negative.    Physical Exam: Estimated body mass index is 34.47 kg/m as calculated from the following:   Height as of 12/04/19: 5\' 10"  (1.778 m).   Weight as of this encounter: 240 lb 3.2 oz (109 kg). BP 112/76    Pulse 70    Temp (!) 97.5 F (36.4 C)    Wt 240 lb 3.2 oz (109 kg)    SpO2 97%    BMI 34.47 kg/m  General Appearance: Well nourished, in no apparent distress.  Eyes: PERRLA, EOMs, conjunctiva no swelling or erythema Sinuses: No Frontal/maxillary tenderness  ENT/Mouth: Ext aud canals clear, normal light reflex  with TMs without erythema, bulging. No erythema, swelling, or exudate on post pharynx. Marland Kitchen Hearing normal.  Neck: Supple, thyroid normal. No bruits  Respiratory: Respiratory effort normal, BS equal bilaterally without rales, rhonchi, wheezing or stridor.  Cardio: Irregularly irregular without audible murmur. Intact symmetrical peripheral pulses with scant non-pitting edema.  Chest: symmetric, with normal excursions and percussion.  Abdomen: Soft, nontender, no guarding, rebound, hernias, masses, or organomegaly.  Lymphatics: Non tender without lymphadenopathy.  Musculoskeletal: Full ROM all peripheral extremities, 5/5 strength, and very slow steady gait, slow sitting to standing. Mild kyphosis.  Skin: Warm, dry without rashes, lesions, ecchymosis. Neuro: Cranial nerves intact, reflexes equal bilaterally. Normal muscle tone, no cerebellar symptoms. Sensation intact.  Psych: Awake and oriented X 3, normal affect, Insight and Judgment appropriate.     Darlene [name] 4:24 PM [facility]

## 2020-01-15 ENCOUNTER — Other Ambulatory Visit: Payer: Self-pay

## 2020-01-15 ENCOUNTER — Ambulatory Visit (INDEPENDENT_AMBULATORY_CARE_PROVIDER_SITE_OTHER): Payer: Medicare Other | Admitting: Adult Health

## 2020-01-15 ENCOUNTER — Encounter: Payer: Self-pay | Admitting: Adult Health

## 2020-01-15 VITALS — BP 112/76 | HR 70 | Temp 97.5°F | Wt 240.2 lb

## 2020-01-15 DIAGNOSIS — I119 Hypertensive heart disease without heart failure: Secondary | ICD-10-CM | POA: Diagnosis not present

## 2020-01-15 DIAGNOSIS — G4733 Obstructive sleep apnea (adult) (pediatric): Secondary | ICD-10-CM

## 2020-01-15 DIAGNOSIS — Z9181 History of falling: Secondary | ICD-10-CM | POA: Diagnosis not present

## 2020-01-15 DIAGNOSIS — R7309 Other abnormal glucose: Secondary | ICD-10-CM

## 2020-01-15 DIAGNOSIS — G4737 Central sleep apnea in conditions classified elsewhere: Secondary | ICD-10-CM

## 2020-01-15 DIAGNOSIS — E782 Mixed hyperlipidemia: Secondary | ICD-10-CM

## 2020-01-15 DIAGNOSIS — I4891 Unspecified atrial fibrillation: Secondary | ICD-10-CM

## 2020-01-15 DIAGNOSIS — D509 Iron deficiency anemia, unspecified: Secondary | ICD-10-CM | POA: Diagnosis not present

## 2020-01-15 DIAGNOSIS — I272 Pulmonary hypertension, unspecified: Secondary | ICD-10-CM | POA: Diagnosis not present

## 2020-01-15 DIAGNOSIS — Z79899 Other long term (current) drug therapy: Secondary | ICD-10-CM

## 2020-01-15 DIAGNOSIS — R5381 Other malaise: Secondary | ICD-10-CM | POA: Insufficient documentation

## 2020-01-15 DIAGNOSIS — R2681 Unsteadiness on feet: Secondary | ICD-10-CM | POA: Diagnosis not present

## 2020-01-15 DIAGNOSIS — F3342 Major depressive disorder, recurrent, in full remission: Secondary | ICD-10-CM

## 2020-01-15 DIAGNOSIS — E559 Vitamin D deficiency, unspecified: Secondary | ICD-10-CM

## 2020-01-15 MED ORDER — FERROUS SULFATE 325 (65 FE) MG PO TABS: 325.0000 mg | ORAL_TABLET | ORAL | Status: DC

## 2020-01-15 NOTE — Patient Instructions (Addendum)
Goals    . DIET - INCREASE WATER INTAKE    . DIET - REDUCE FAST FOOD INTAKE    . DIET - REDUCE PORTION SIZE    . Exercise 3x per week (30 min per time)       With low risk history, most likely cause for sleep apnea (and consequently a. Fib) is weight -  If can get weight down, this should help reduce stress on heart   Recommend - limiting cereal portions to 1 cup or less, add fresh fruit or a side of egg or other protein if feeling hungry  Instead of sandwich daily - alternate with leftovers, skip chips on leftover days -  Try to do large portions of veggies  Recommend reducing portions of ice cream - use a tea cup or similar small vessel rather than large container  Increase water intake as much as possible, early in the day       Exercises To Do While Sitting  Exercises that you do while sitting (chair exercises) can give you many of the same benefits as full exercise. Benefits include strengthening your heart, burning calories, and keeping muscles and joints healthy. Exercise can also improve your mood and help with depression and anxiety. You may benefit from chair exercises if you are unable to do standing exercises because of:  Diabetic foot pain.  Obesity.  Illness.  Arthritis.  Recovery from surgery or injury.  Breathing problems.  Balance problems.  Another type of disability. Before starting chair exercises, check with your health care provider or a physical therapist to find out how much exercise you can tolerate and which exercises are safe for you. If your health care provider approves:  Start out slowly and build up over time. Aim to work up to about 10-20 minutes for each exercise session.  Make exercise part of your daily routine.  Drink water when you exercise. Do not wait until you are thirsty. Drink every 10-15 minutes.  Stop exercising right away if you have pain, nausea, shortness of breath, or dizziness.  If you are exercising in a  wheelchair, make sure to lock the wheels.  Ask your health care provider whether you can do tai chi or yoga. Many positions in these mind-body exercises can be modified to do while seated. Warm-up Before starting other exercises: 1. Sit up as straight as you can. Have your knees bent at 90 degrees, which is the shape of the capital letter "L." Keep your feet flat on the floor. 2. Sit at the front edge of your chair, if you can. 3. Pull in (tighten) the muscles in your abdomen and stretch your spine and neck as straight as you can. Hold this position for a few minutes. 4. Breathe in and out evenly. Try to concentrate on your breathing, and relax your mind. Stretching Exercise A: Arm stretch 1. Hold your arms out straight in front of your body. 2. Bend your hands at the wrist with your fingers pointing up, as if signaling someone to stop. Notice the slight tension in your forearms as you hold the position. 3. Keeping your arms out and your hands bent, rotate your hands outward as far as you can and hold this stretch. Aim to have your thumbs pointing up and your pinkie fingers pointing down. Slowly repeat arm stretches for one minute as tolerated. Exercise B: Leg stretch 1. If you can move your legs, try to "draw" letters on the floor with the toes of your  foot. Write your name with one foot. 2. Write your name with the toes of your other foot. Slowly repeat the movements for one minute as tolerated. Exercise C: Reach for the sky 1. Reach your hands as far over your head as you can to stretch your spine. 2. Move your hands and arms as if you are climbing a rope. Slowly repeat the movements for one minute as tolerated. Range of motion exercises Exercise A: Shoulder roll 1. Let your arms hang loosely at your sides. 2. Lift just your shoulders up toward your ears, then let them relax back down. 3. When your shoulders feel loose, rotate your shoulders in backward and forward circles. Do shoulder  rolls slowly for one minute as tolerated. Exercise B: March in place 1. As if you are marching, pump your arms and lift your legs up and down. Lift your knees as high as you can. ? If you are unable to lift your knees, just pump your arms and move your ankles and feet up and down. March in place for one minute as tolerated. Exercise C: Seated jumping jacks 1. Let your arms hang down straight. 2. Keeping your arms straight, lift them up over your head. Aim to point your fingers to the ceiling. 3. While you lift your arms, straighten your legs and slide your heels along the floor to your sides, as wide as you can. 4. As you bring your arms back down to your sides, slide your legs back together. ? If you are unable to use your legs, just move your arms. Slowly repeat seated jumping jacks for one minute as tolerated. Strengthening exercises Exercise A: Shoulder squeeze 1. Hold your arms straight out from your body to your sides, with your elbows bent and your fists pointed at the ceiling. 2. Keeping your arms in the bent position, move them forward so your elbows and forearms meet in front of your face. 3. Open your arms back out as wide as you can with your elbows still bent, until you feel your shoulder blades squeezing together. Hold for 5 seconds. Slowly repeat the movements forward and backward for one minute as tolerated. Contact a health care provider if you:  Had to stop exercising due to any of the following: ? Pain. ? Nausea. ? Shortness of breath. ? Dizziness. ? Fatigue.  Have significant pain or soreness after exercising. Get help right away if you have:  Chest pain.  Difficulty breathing. These symptoms may represent a serious problem that is an emergency. Do not wait to see if the symptoms will go away. Get medical help right away. Call your local emergency services (911 in the [place] Do not drive yourself to the hospital. This information is not intended to replace advice  given to you by your health care provider. Make sure you discuss any questions you have with your health care provider. Document Revised: 06/23/2018 Document Reviewed: 01/13/2017 Elsevier Patient Education  2020 [employer].

## 2020-01-16 LAB — IRON,TIBC AND FERRITIN PANEL
%SAT: 25 % (calc) (ref 16–45)
Ferritin: 38 ng/mL (ref 16–288)
Iron: 98 ug/dL (ref 45–160)
TIBC: 392 mcg/dL (calc) (ref 250–450)

## 2020-01-16 LAB — LIPID PANEL
Cholesterol: 208 mg/dL — ABNORMAL HIGH (ref <200)
HDL: 65 mg/dL (ref >=50)
LDL Cholesterol (Calc): 120 mg/dL (calc) — ABNORMAL HIGH
Non-HDL Cholesterol (Calc): 143 mg/dL (calc) — ABNORMAL HIGH (ref <130)
Total CHOL/HDL Ratio: 3.2 (calc) (ref <5.0)
Triglycerides: 120 mg/dL (ref <150)

## 2020-01-16 LAB — COMPLETE METABOLIC PANEL WITH GFR
AG Ratio: 2.2 (calc) (ref 1.0–2.5)
ALT: 14 U/L (ref 6–29)
AST: 18 U/L (ref 10–35)
Albumin: 4.3 g/dL (ref 3.6–5.1)
Alkaline phosphatase (APISO): 72 U/L (ref 37–153)
BUN: 19 mg/dL (ref 7–25)
CO2: 30 mmol/L (ref 20–32)
Calcium: 10.2 mg/dL (ref 8.6–10.4)
Chloride: 104 mmol/L (ref 98–110)
Creat: 0.79 mg/dL (ref 0.60–0.88)
GFR, Est African American: 82 mL/min/{1.73_m2} (ref >=60)
GFR, Est Non African American: 71 mL/min/{1.73_m2} (ref >=60)
Globulin: 2 g/dL (calc) (ref 1.9–3.7)
Glucose, Bld: 119 mg/dL — ABNORMAL HIGH (ref 65–99)
Potassium: 3.9 mmol/L (ref 3.5–5.3)
Sodium: 141 mmol/L (ref 135–146)
Total Bilirubin: 0.6 mg/dL (ref 0.2–1.2)
Total Protein: 6.3 g/dL (ref 6.1–8.1)

## 2020-01-16 LAB — CBC WITH DIFFERENTIAL/PLATELET
Absolute Monocytes: 730 cells/uL (ref 200–950)
Basophils Absolute: 37 cells/uL (ref 0–200)
Basophils Relative: 0.5 %
Eosinophils Absolute: 110 cells/uL (ref 15–500)
Eosinophils Relative: 1.5 %
HCT: 42.1 % (ref 35.0–45.0)
Hemoglobin: 14.2 g/dL (ref 11.7–15.5)
Lymphs Abs: 1942 cells/uL (ref 850–3900)
MCH: 31.9 pg (ref 27.0–33.0)
MCHC: 33.7 g/dL (ref 32.0–36.0)
MCV: 94.6 fL (ref 80.0–100.0)
MPV: 11.7 fL (ref 7.5–12.5)
Monocytes Relative: 10 %
Neutro Abs: 4482 cells/uL (ref 1500–7800)
Neutrophils Relative %: 61.4 %
Platelets: 222 10*3/uL (ref 140–400)
RBC: 4.45 10*6/uL (ref 3.80–5.10)
RDW: 12.8 % (ref 11.0–15.0)
Total Lymphocyte: 26.6 %
WBC: 7.3 10*3/uL (ref 3.8–10.8)

## 2020-01-16 LAB — MAGNESIUM: Magnesium: 1.9 mg/dL (ref 1.5–2.5)

## 2020-01-16 LAB — TSH: TSH: 3.43 mIU/L (ref 0.40–4.50)

## 2020-02-04 NOTE — Progress Notes (Signed)
Cardiology Office Note    Date:  02/05/2020   ID:  Darlene [name], Darlene [name] 10-14-1940, MRN [id number]  PCP:  Darlene Pinto, MD  Cardiologist: Darlene Dolly, MD    Chief Complaint  Patient presents with  . Follow-up    6 week visit    History of Present Illness:    Darlene [name] is a 79 y.o. female with past medical history of persistent atrial fibrillation (on Eliquis for anticoagulation), pulmonary hypertension (felt to be secondary to OSA as prior PFT's were reassuring), OSA (on CPAP), HTN and obesity who presents to the office today for 6-week follow-up.   She was last examined by Dr. Harl [name] in 11/2019 and reported episodes of exertional chest pain which would improve with rest. A stress test was recommended for further evaluation. Her Lexiscan Myoview showed no evidence of ischemia or scar, overall being a low-risk study.   In talking with the patient today, she reports still having dyspnea on exertion which can occur when walking from the parking lot into the office. She says the symptoms have been occurring for 3 to 4 years. She also reports lower extremity weakness and generalized fatigue. She denies any recent episodes of chest pain. No recent orthopnea or PND. She has experienced intermittent lower extremity edema and does elevate her lower extremities at times. She has not been on diuretic therapy.   Past Medical History:  Diagnosis Date  . Anxiety   . Chronic lower back pain   . Depression   . DJD (degenerative joint disease)    knees, neck  . GERD (gastroesophageal reflux disease)    has had dilitation in the past  . Hypertension     Past Surgical History:  Procedure Laterality Date  . ABDOMINAL HYSTERECTOMY  03/1979  . BREAST BIOPSY Left 1986 and 1987  . CATARACT EXTRACTION, BILATERAL Bilateral 2014   Dr. Madelin [name], in [place]    Current Medications: Outpatient Medications Prior to Visit  Medication Sig Dispense Refill  . acetaminophen (TYLENOL)  500 MG tablet Take 500 mg by mouth every 6 (six) hours as needed.    Marland Kitchen apixaban (ELIQUIS) 5 MG TABS tablet Take 1 tablet (5 mg total) by mouth 2 (two) times daily. 180 tablet 3  . Ascorbic Acid (VITAMIN C) 500 MG CAPS Take 500 mg by mouth.     . CHOLECALCIFEROL PO Take 5,000 Units by mouth daily.    . cyanocobalamin 1000 MCG tablet Take 1,000 mcg by mouth daily.    . cyclobenzaprine (FLEXERIL) 10 MG tablet Take 1/2 to 1 tablet    2 to 3 x /day if needed for Muscle Spasms 90 tablet 0  . diltiazem (CARDIZEM CD) 300 MG 24 hr capsule Take 1 capsule by mouth once daily 30 capsule 6  . ferrous sulfate 325 (65 FE) MG tablet Take 1 tablet (325 mg total) by mouth every other day.    . lansoprazole (PREVACID) 30 MG capsule Take 1 capsule Daily for Heartburn & Indigestion 90 capsule 3  . metoprolol tartrate (LOPRESSOR) 25 MG tablet Take 50 mg( 2 Tablets )  in the Morning and Take 25 mg ( 1 Tablet)  in the Evening 90 tablet 11  . PARoxetine (PAXIL) 20 MG tablet Take 1 tablet Daily for Mood 90 tablet 0  . Probiotic Product (PROBIOTIC DAILY PO) Take 1 tablet by mouth daily.    Marland Kitchen zinc gluconate 50 MG tablet Take 50 mg by mouth daily.  No facility-administered medications prior to visit.     Allergies:   Cephalosporins   Social History   Socioeconomic History  . Marital status: Widowed    Spouse name: Not on file  . Number of children: 2  . Years of education: Not on file  . Highest education level: Not on file  Occupational History  . Occupation: retired    Comment: worked in Academic librarian  . Smoking status: Never Smoker  . Smokeless tobacco: Never Used  Vaping Use  . Vaping Use: Never used  Substance and Sexual Activity  . Alcohol use: Yes    Alcohol/week: 3.0 standard drinks    Types: 3 Standard drinks or equivalent per week    Comment: 3 nights out of the week  . Drug use: No  . Sexual activity: Not Currently  Other Topics Concern  . Not on file  Social History Narrative    . Not on file   Social Determinants of Health   Financial Resource Strain: Low Risk   . Difficulty of Paying Living Expenses: Not very hard  Food Insecurity: No Food Insecurity  . Worried About Charity fundraiser in the Last Year: Never true  . Ran Out of Food in the Last Year: Never true  Transportation Needs: No Transportation Needs  . Lack of Transportation (Medical): No  . Lack of Transportation (Non-Medical): No  Physical Activity: Inactive  . Days of Exercise per Week: 0 days  . Minutes of Exercise per Session: 0 min  Stress: No Stress Concern Present  . Feeling of Stress : Only a little  Social Connections: Moderately Integrated  . Frequency of Communication with Friends and Family: More than three times a week  . Frequency of Social Gatherings with Friends and Family: More than three times a week  . Attends Religious Services: 1 to 4 times per year  . Active Member of Clubs or Organizations: Yes  . Attends Archivist Meetings: More than 4 times per year  . Marital Status: Widowed     Family History:  The patient's family history includes Dementia in her brother; Heart disease in her father and mother; Hypertension in her daughter, father, and mother; Tremor in her sister.   Review of Systems:   Please see the history of present illness.     General:  No chills, fever, night sweats or weight changes. Positive for fatigue.  Cardiovascular:  No chest pain, edema, orthopnea, palpitations, paroxysmal nocturnal dyspnea. Positive for dyspnea on exertion.  Dermatological: No rash, lesions/masses Respiratory: No cough, dyspnea Urologic: No hematuria, dysuria Abdominal:   No nausea, vomiting, diarrhea, bright red blood per rectum, melena, or hematemesis Neurologic:  No visual changes, wkns, changes in mental status. All other systems reviewed and are otherwise negative except as noted above.   Physical Exam:    VS:  BP 118/70   Pulse 60   Ht 5\' 10"  (1.778 m)    Wt 243 lb (110.2 kg)   SpO2 95%   BMI 34.87 kg/m    General: Well developed, elderly female appearing in no acute distress. Head: Normocephalic, atraumatic. Neck: No carotid bruits. JVD not elevated.  Lungs: Respirations regular and unlabored, without wheezes or rales.  Heart: Irregularly irregular. No S3 or S4.  No murmur, no rubs, or gallops appreciated. Abdomen: Appears non-distended. No obvious abdominal masses. Msk:  Strength and tone appear normal for age. No obvious joint deformities or effusions. Extremities: No clubbing or cyanosis. Trace ankle  edema bilaterally.  Distal pedal pulses are 2+ bilaterally. Neuro: Alert and oriented X 3. Moves all extremities spontaneously. No focal deficits noted. Psych:  Responds to questions appropriately with a normal affect. Skin: No rashes or lesions noted  Wt Readings from Last 3 Encounters:  02/05/20 243 lb (110.2 kg)  01/15/20 240 lb 3.2 oz (109 kg)  12/04/19 238 lb 3.2 oz (108 kg)      Studies/Labs Reviewed:   EKG:  EKG is not ordered today.    Recent Labs: 01/15/2020: ALT 14; BUN 19; Creat 0.79; Hemoglobin 14.2; Magnesium 1.9; Platelets 222; Potassium 3.9; Sodium 141; TSH 3.43   Lipid Panel    Component Value Date/Time   CHOL 208 (H) 01/15/2020 1655   TRIG 120 01/15/2020 1655   HDL 65 01/15/2020 1655   CHOLHDL 3.2 01/15/2020 1655   VLDL 20 04/16/2019 0435   LDLCALC 120 (H) 01/15/2020 1655    Additional studies/ records that were reviewed today include:   Echocardiogram: 03/2019 IMPRESSIONS    1. Left ventricular ejection fraction, by visual estimation, is 55 to  60%. The left ventricle has normal function. There is mildly increased  left ventricular hypertrophy.  2. Left ventricular diastolic parameters are indeterminate.  3. The left ventricle has no regional wall motion abnormalities.  4. Global right ventricle has normal systolic function.The right  ventricular size is moderately enlarged. No increase in  right ventricular  wall thickness.  5. Left atrial size was severely dilated.  6. Right atrial size was moderately dilated.  7. The mitral valve is normal in structure. Mild mitral valve  regurgitation. No evidence of mitral stenosis.  8. The tricuspid valve is normal in structure.  9. The tricuspid valve is normal in structure. Tricuspid valve  regurgitation is mild.  10. The aortic valve is tricuspid. Aortic valve regurgitation is not  visualized. No evidence of aortic valve sclerosis or stenosis.  11. The pulmonic valve was not well visualized. Pulmonic valve  regurgitation is not visualized.  12. Moderately elevated pulmonary artery systolic pressure.  13. The inferior vena cava is dilated in size with <50% respiratory  variability, suggesting right atrial pressure of 15 mmHg.   NST: 11/2019  There was no ST segment deviation noted during stress.  The study is normal. There are no perfusion defect consistentw with prior infarct or current ischemia.  This is a low risk study.  The left ventricular ejection fraction is normal (55-65%).    Assessment:    1. Persistent atrial fibrillation ([place])   2. Dyspnea on exertion   3. Pulmonary hypertension, unspecified ([place])   4. Severe obstructive sleep apnea-hypopnea syndrome   5. Essential hypertension      Plan:   In order of problems listed above:  1. Persistent Atrial Fibrillation - She denies any recent palpitations and heart rate is well controlled in the 60's during today's visit. Continue Cardizem CD 300 mg daily and Lopressor 50 mg in AM/25 mg in PM for rate control. - She denies any evidence of active bleeding. Remains on Eliquis 5 mg twice daily for anticoagulation. Recent labs earlier this month showed hemoglobin was stable at 14.2 and platelets at 222.  2. Dyspnea on Exertion/Pulmonary HTN - Suspect this is secondary to her pulmonary hypertension and also deconditioning. She did have prior PFTs which were  reassuring and her pulmonary hypertension is felt to be secondary to severe OSA. Recent [employer] showed no evidence of ischemia or scarring was overall a low risk study.  Given her prior episodes of chest pain, I did provide her with an Rx for SL NTG and we reviewed how to take this if needed and when to seek emergent evaluation. For her lower extremity edema, I recommended that she initially try utilizing compression stockings given her mild symptoms. If symptoms persist, could consider the use of PRN Lasix.   3. OSA - Continued compliance with CPAP encouraged.  4. HTN - BP is well controlled at 118/70 during today's visit. Continue current medication regimen with Cardizem CD and Lopressor.   Medication Adjustments/Labs and Tests Ordered: Current medicines are reviewed at length with the patient today.  Concerns regarding medicines are outlined above.  Medication changes, Labs and Tests ordered today are listed in the Patient Instructions below. Patient Instructions  Medication Instructions:   No changes. Can take Nitroglycerin if needed for chest pain.  Would recommend utilizing compression stockings for lower extremity edema.   Labwork:  None   Testing/Procedures:  None  Follow-Up:  With Dr. Harl [name] in 6 months.   Any Other Special Instructions Will Be Listed Below (If Applicable).     If you need a refill on your cardiac medications before your next appointment, please call your pharmacy.        Nitroglycerin sublingual tablets What is this medicine? NITROGLYCERIN (nye troe GLI ser in) is a type of vasodilator. It relaxes blood vessels, increasing the blood and oxygen supply to your heart. This medicine is used to relieve chest pain caused by angina. It is also used to prevent chest pain before activities like climbing stairs, going outdoors in cold weather, or sexual activity. This medicine may be used for other purposes; ask your health care provider or  pharmacist if you have questions. COMMON BRAND NAME(S): Nitroquick, Nitrostat, Nitrotab What should I tell my health care provider before I take this medicine? They need to know if you have any of these conditions:  anemia  head injury, recent stroke, or bleeding in the brain  liver disease  previous heart attack  an unusual or allergic reaction to nitroglycerin, other medicines, foods, dyes, or preservatives  pregnant or trying to get pregnant  breast-feeding How should I use this medicine? Take this medicine by mouth as needed. At the first sign of an angina attack (chest pain or tightness) place one tablet under your tongue. You can also take this medicine 5 to 10 minutes before an event likely to produce chest pain. Follow the directions on the prescription label. Let the tablet dissolve under the tongue. Do not swallow whole. Replace the dose if you accidentally swallow it. It will help if your mouth is not dry. Saliva around the tablet will help it to dissolve more quickly. Do not eat or drink, smoke or chew tobacco while a tablet is dissolving. If you are not better within 5 minutes after taking ONE dose of nitroglycerin, call 9-1-1 immediately to seek emergency medical care. Do not take more than 3 nitroglycerin tablets over 15 minutes. If you take this medicine often to relieve symptoms of angina, your doctor or health care professional may provide you with different instructions to manage your symptoms. If symptoms do not go away after following these instructions, it is important to call 9-1-1 immediately. Do not take more than 3 nitroglycerin tablets over 15 minutes. Talk to your pediatrician regarding the use of this medicine in children. Special care may be needed. Overdosage: If you think you have taken too much of this medicine contact a  poison control center or emergency room at once. NOTE: This medicine is only for you. Do not share this medicine with others. What if I miss  a dose? This does not apply. This medicine is only used as needed. What may interact with this medicine? Do not take this medicine with any of the following medications:  certain migraine medicines like ergotamine and dihydroergotamine (DHE)  medicines used to treat erectile dysfunction like sildenafil, tadalafil, and vardenafil  riociguat This medicine may also interact with the following medications:  alteplase  aspirin  heparin  medicines for high blood pressure  medicines for mental depression  other medicines used to treat angina  phenothiazines like chlorpromazine, mesoridazine, prochlorperazine, thioridazine This list may not describe all possible interactions. Give your health care provider a list of all the medicines, herbs, non-prescription drugs, or dietary supplements you use. Also tell them if you smoke, drink alcohol, or use illegal drugs. Some items may interact with your medicine. What should I watch for while using this medicine? Tell your doctor or health care professional if you feel your medicine is no longer working. Keep this medicine with you at all times. Sit or lie down when you take your medicine to prevent falling if you feel dizzy or faint after using it. Try to remain calm. This will help you to feel better faster. If you feel dizzy, take several deep breaths and lie down with your feet propped up, or bend forward with your head resting between your knees. You may get drowsy or dizzy. Do not drive, use machinery, or do anything that needs mental alertness until you know how this drug affects you. Do not stand or sit up quickly, especially if you are an older patient. This reduces the risk of dizzy or fainting spells. Alcohol can make you more drowsy and dizzy. Avoid alcoholic drinks. Do not treat yourself for coughs, colds, or pain while you are taking this medicine without asking your doctor or health care professional for advice. Some ingredients may  increase your blood pressure. What side effects may I notice from receiving this medicine? Side effects that you should report to your doctor or health care professional as soon as possible:  blurred vision  dry mouth  skin rash  sweating  the feeling of extreme pressure in the head  unusually weak or tired Side effects that usually do not require medical attention (report to your doctor or health care professional if they continue or are bothersome):  flushing of the face or neck  headache  irregular heartbeat, palpitations  nausea, vomiting This list may not describe all possible side effects. Call your doctor for medical advice about side effects. You may report side effects to FDA at 1-800-FDA-1088. Where should I keep my medicine? Keep out of the reach of children. Store at room temperature between 20 and 25 degrees C (68 and 77 degrees F). Store in [employer] of Staff. Protect from light and moisture. Keep tightly closed. Throw away any unused medicine after the expiration date. NOTE: This sheet is a summary. It may not cover all possible information. If you have questions about this medicine, talk to your doctor, pharmacist, or health care provider.  2020 Elsevier/Gold Standard (2012-12-29 17:57:36)   Signed, Erma Heritage, PA-C  02/05/2020 6:08 PM    [place] [address] [place], [place] [address] Phone: [phone] Fax: [phone]

## 2020-02-05 ENCOUNTER — Ambulatory Visit (INDEPENDENT_AMBULATORY_CARE_PROVIDER_SITE_OTHER): Payer: Medicare Other | Admitting: Student

## 2020-02-05 ENCOUNTER — Encounter: Payer: Self-pay | Admitting: Student

## 2020-02-05 ENCOUNTER — Other Ambulatory Visit: Payer: Self-pay

## 2020-02-05 VITALS — BP 118/70 | HR 60 | Ht 70.0 in | Wt 243.0 lb

## 2020-02-05 DIAGNOSIS — R0600 Dyspnea, unspecified: Secondary | ICD-10-CM

## 2020-02-05 DIAGNOSIS — R0609 Other forms of dyspnea: Secondary | ICD-10-CM

## 2020-02-05 DIAGNOSIS — I10 Essential (primary) hypertension: Secondary | ICD-10-CM | POA: Diagnosis not present

## 2020-02-05 DIAGNOSIS — I4819 Other persistent atrial fibrillation: Secondary | ICD-10-CM | POA: Diagnosis not present

## 2020-02-05 DIAGNOSIS — I272 Pulmonary hypertension, unspecified: Secondary | ICD-10-CM

## 2020-02-05 DIAGNOSIS — G4733 Obstructive sleep apnea (adult) (pediatric): Secondary | ICD-10-CM | POA: Diagnosis not present

## 2020-02-05 MED ORDER — NITROGLYCERIN 0.4 MG SL SUBL: 0.4000 mg | SUBLINGUAL_TABLET | SUBLINGUAL | 3 refills | Status: AC | PRN

## 2020-02-05 NOTE — Patient Instructions (Addendum)
Medication Instructions:   No changes. Can take Nitroglycerin if needed for chest pain.  Would recommend utilizing compression stockings for lower extremity edema.   Labwork:  None   Testing/Procedures:  None  Follow-Up:  With Dr. Harl Bowie in 6 months.   Any Other Special Instructions Will Be Listed Below (If Applicable).     If you need a refill on your cardiac medications before your next appointment, please call your pharmacy.        Nitroglycerin sublingual tablets What is this medicine? NITROGLYCERIN (nye troe GLI ser in) is a type of vasodilator. It relaxes blood vessels, increasing the blood and oxygen supply to your heart. This medicine is used to relieve chest pain caused by angina. It is also used to prevent chest pain before activities like climbing stairs, going outdoors in cold weather, or sexual activity. This medicine may be used for other purposes; ask your health care provider or pharmacist if you have questions. COMMON BRAND NAME(S): Nitroquick, Nitrostat, Nitrotab What should I tell my health care provider before I take this medicine? They need to know if you have any of these conditions:  anemia  head injury, recent stroke, or bleeding in the brain  liver disease  previous heart attack  an unusual or allergic reaction to nitroglycerin, other medicines, foods, dyes, or preservatives  pregnant or trying to get pregnant  breast-feeding How should I use this medicine? Take this medicine by mouth as needed. At the first sign of an angina attack (chest pain or tightness) place one tablet under your tongue. You can also take this medicine 5 to 10 minutes before an event likely to produce chest pain. Follow the directions on the prescription label. Let the tablet dissolve under the tongue. Do not swallow whole. Replace the dose if you accidentally swallow it. It will help if your mouth is not dry. Saliva around the tablet will help it to dissolve more  quickly. Do not eat or drink, smoke or chew tobacco while a tablet is dissolving. If you are not better within 5 minutes after taking ONE dose of nitroglycerin, call 9-1-1 immediately to seek emergency medical care. Do not take more than 3 nitroglycerin tablets over 15 minutes. If you take this medicine often to relieve symptoms of angina, your doctor or health care professional may provide you with different instructions to manage your symptoms. If symptoms do not go away after following these instructions, it is important to call 9-1-1 immediately. Do not take more than 3 nitroglycerin tablets over 15 minutes. Talk to your pediatrician regarding the use of this medicine in children. Special care may be needed. Overdosage: If you think you have taken too much of this medicine contact a poison control center or emergency room at once. NOTE: This medicine is only for you. Do not share this medicine with others. What if I miss a dose? This does not apply. This medicine is only used as needed. What may interact with this medicine? Do not take this medicine with any of the following medications:  certain migraine medicines like ergotamine and dihydroergotamine (DHE)  medicines used to treat erectile dysfunction like sildenafil, tadalafil, and vardenafil  riociguat This medicine may also interact with the following medications:  alteplase  aspirin  heparin  medicines for high blood pressure  medicines for mental depression  other medicines used to treat angina  phenothiazines like chlorpromazine, mesoridazine, prochlorperazine, thioridazine This list may not describe all possible interactions. Give your health care provider a list  of all the medicines, herbs, non-prescription drugs, or dietary supplements you use. Also tell them if you smoke, drink alcohol, or use illegal drugs. Some items may interact with your medicine. What should I watch for while using this medicine? Tell your doctor  or health care professional if you feel your medicine is no longer working. Keep this medicine with you at all times. Sit or lie down when you take your medicine to prevent falling if you feel dizzy or faint after using it. Try to remain calm. This will help you to feel better faster. If you feel dizzy, take several deep breaths and lie down with your feet propped up, or bend forward with your head resting between your knees. You may get drowsy or dizzy. Do not drive, use machinery, or do anything that needs mental alertness until you know how this drug affects you. Do not stand or sit up quickly, especially if you are an older patient. This reduces the risk of dizzy or fainting spells. Alcohol can make you more drowsy and dizzy. Avoid alcoholic drinks. Do not treat yourself for coughs, colds, or pain while you are taking this medicine without asking your doctor or health care professional for advice. Some ingredients may increase your blood pressure. What side effects may I notice from receiving this medicine? Side effects that you should report to your doctor or health care professional as soon as possible:  blurred vision  dry mouth  skin rash  sweating  the feeling of extreme pressure in the head  unusually weak or tired Side effects that usually do not require medical attention (report to your doctor or health care professional if they continue or are bothersome):  flushing of the face or neck  headache  irregular heartbeat, palpitations  nausea, vomiting This list may not describe all possible side effects. Call your doctor for medical advice about side effects. You may report side effects to FDA at 1-800-FDA-1088. Where should I keep my medicine? Keep out of the reach of children. Store at room temperature between 20 and 25 degrees C (68 and 77 degrees F). Store in [employer]. Protect from light and moisture. Keep tightly closed. Throw away any unused medicine after the  expiration date. NOTE: This sheet is a summary. It may not cover all possible information. If you have questions about this medicine, talk to your doctor, pharmacist, or health care provider.  2020 Elsevier/Gold Standard (2012-12-29 17:57:36)

## 2020-02-07 ENCOUNTER — Other Ambulatory Visit: Payer: Self-pay

## 2020-02-07 ENCOUNTER — Ambulatory Visit
Admission: RE | Admit: 2020-02-07 | Discharge: 2020-02-07 | Disposition: A | Discharge status: Home / Self Care | Payer: Medicare Other | Source: Ambulatory Visit | Attending: Adult Health | Admitting: Adult Health

## 2020-02-07 DIAGNOSIS — Z1231 Encounter for screening mammogram for malignant neoplasm of breast: Secondary | ICD-10-CM | POA: Diagnosis not present

## 2020-02-19 ENCOUNTER — Other Ambulatory Visit: Payer: Self-pay | Admitting: Internal Medicine

## 2020-02-27 DIAGNOSIS — Z23 Encounter for immunization: Secondary | ICD-10-CM | POA: Diagnosis not present

## 2020-03-06 ENCOUNTER — Other Ambulatory Visit: Payer: Self-pay | Admitting: *Deleted

## 2020-03-06 NOTE — Patient Outreach (Signed)
[facility]) Care Management  03/06/2020  Darlene [name] 1940/11/14 [id number]  Telephone outreach for follow up AFIB  Talked with Darlene [name], pt's daughter and caregiver today. Her mother was heard in the background as Darlene [name] asked her questions during our conversation.  AFIB: stabilized. No ED visits since her dx in Jan 2021. She continues her new meds for same and is tolerating them well.  Pt completed her goals in June.  Will transition to [place] for continued surveillance for [facility]. Daughter agreed to this new arrangement.  Darlene Pont. [name], MSN, [facility] Gerontological Nurse Practitioner [facility] Care Management [phone]

## 2020-03-07 ENCOUNTER — Other Ambulatory Visit: Payer: Self-pay | Admitting: *Deleted

## 2020-03-07 NOTE — Addendum Note (Signed)
Addended by: Deloria Lair on: 03/07/2020 08:12 AM   Modules accepted: Orders

## 2020-03-18 ENCOUNTER — Other Ambulatory Visit: Payer: Self-pay

## 2020-03-18 MED ORDER — DILTIAZEM HCL ER COATED BEADS 300 MG PO CP24
300.0000 mg | ORAL_CAPSULE | Freq: Every day | ORAL | 3 refills | Status: DC
Start: 2020-03-18 — End: 2021-04-22

## 2020-03-20 ENCOUNTER — Other Ambulatory Visit: Payer: Self-pay | Admitting: *Deleted

## 2020-03-21 ENCOUNTER — Encounter: Payer: Self-pay | Admitting: *Deleted

## 2020-03-21 NOTE — Patient Outreach (Signed)
[facility]) Care Management  [facility] Care Manager  03/21/2020   Darlene [name] [birth year] [id number]  Subjective: Successful telephone outreach call to patient. HIPAA identifiers obtained. Patient reports she is doing well. She states that her home environment is safe, denies any needs for DME, and explains that she is in a good place emotionally now that she is living with her daughter Darlene [name] who supports her and takes extremely good care of her. Patient states that she continues to feel fatigued and SOB with exertion. This is well documented within the notes of her latest cardiology appointments on 01/15/20 and 02/05/20. Per the notes the patient's stress test showed the patient to be at low risk  and a PFT  was reassuring. Cardiology suspects these symptoms to be her pulmonary hypertension and deconditioning as they are not new and have been occurring for years. Patient reports being compliant with taking her medications. She reports that she does take her pulse and B/P several times weekly and she does have a portable home ECG monitor at home. Patient shared that she is not increasing her physical activity to maintain or improve her strength and overall wellness and that her diet could be healthier. Patient is open to trying to do chair exercises and nurse will send her chair exercise printouts as well as a planning healthy meals booklet. Patient did not have any further questions or concerns today.    Encounter Medications:  Outpatient Encounter Medications as of 03/20/2020  Medication Sig Note  . acetaminophen (TYLENOL) 500 MG tablet Take 500 mg by mouth every 6 (six) hours as needed.   Marland Kitchen apixaban (ELIQUIS) 5 MG TABS tablet Take 1 tablet (5 mg total) by mouth 2 (two) times daily.   . Ascorbic Acid (VITAMIN C) 500 MG CAPS Take 500 mg by mouth.    . CHOLECALCIFEROL PO Take 5,000 Units by mouth daily. 03/31/2018: 7,000units  . cyanocobalamin 1000 MCG tablet Take 1,000 mcg by mouth  daily.   . cyclobenzaprine (FLEXERIL) 10 MG tablet Take 1/2 to 1 tablet    2 to 3 x /day if needed for Muscle Spasms   . diltiazem (CARDIZEM CD) 300 MG 24 hr capsule Take 1 capsule (300 mg total) by mouth daily.   . ferrous sulfate 325 (65 FE) MG tablet Take 1 tablet (325 mg total) by mouth every other day.   . lansoprazole (PREVACID) 30 MG capsule Take 1 capsule Daily for Heartburn & Indigestion   . metoprolol tartrate (LOPRESSOR) 25 MG tablet Take 50 mg( 2 Tablets )  in the Morning and Take 25 mg ( 1 Tablet)  in the Evening   . nitroGLYCERIN (NITROSTAT) 0.4 MG SL tablet Place 1 tablet (0.4 mg total) under the tongue every 5 (five) minutes as needed for chest pain.   Marland Kitchen PARoxetine (PAXIL) 20 MG tablet TAKE 1 TABLET BY MOUTH  DAILY FOR MOOD   . Probiotic Product (PROBIOTIC DAILY PO) Take 1 tablet by mouth daily.   Marland Kitchen zinc gluconate 50 MG tablet Take 50 mg by mouth daily.    No facility-administered encounter medications on file as of 03/20/2020.    Functional Status:  In your present state of health, do you have any difficulty performing the following activities: 10/09/2019 05/28/2019  Hearing? N N  Vision? N N  Difficulty concentrating or making decisions? N N  Walking or climbing stairs? N N  Dressing or bathing? N N  Doing errands, shopping? N Y  Comment - daughter transports  Preparing Food and eating ? - -  Using the Toilet? - -  In the past six months, have you accidently leaked urine? - -  Do you have problems with loss of bowel control? - -  Managing your Medications? - -  Managing your Finances? - -  Housekeeping or managing your Housekeeping? - -  Some recent data might be hidden    Fall/Depression Screening: Fall Risk  03/20/2020 10/09/2019 05/28/2019  Falls in the past year? 1 0 0  Number falls in past yr: 1 - -  Comment - - -  Injury with Fall? 0 - -  Risk for fall due to : - No Fall Risks No Fall Risks  Follow up - Education provided;Falls evaluation completed;Falls  prevention discussed Falls prevention discussed;Education provided;Falls evaluation completed   PHQ 2/9 Scores 03/20/2020 10/09/2019 05/28/2019 04/28/2019 04/03/2019 09/04/2018 09/04/2018  PHQ - 2 Score 0 0 0 2 0 0 0  PHQ- 9 Score - - - 4 - - -    Assessment:  Goals Addressed            This Visit's Progress   . COMPLETED: DIET - INCREASE WATER INTAKE       Resolved due to duplicate goals.    . COMPLETED: DIET - REDUCE FAST FOOD INTAKE       Resolved due to duplicate goals    . COMPLETED: DIET - REDUCE PORTION SIZE       Resolved due to duplicate goals    . COMPLETED: Exercise 3x per week (30 min per time)       Resolved due to duplicate goals.    . Track and Manage Heart Rate and Rhythm-Atrial Fibrillation       Timeframe:  Long-Range Goal Priority:  High Start Date:  03/20/20                           Expected End Date: 09/12/20                       Follow Up Date 07/13/20    - check pulse (heart) rate once a day - make a plan to exercise regularly - make a plan to eat healthy - take medicine as prescribed    Why is this important?    Atrial fibrillation may have no symptoms. Sometimes the symptoms get worse or happen more often.   It is important to keep track of what your symptoms are and when they happen.   A change in symptoms is important to discuss with your doctor or nurse.   Being active and healthy eating will also help you manage your heart condition.     Notes: Patient reports checking her B/P and pulse several times weekly. She takes her medication as prescribed and reports having a good appetite. Nurse encouraged patient to increase her physical activity, increase her fluid and water intake, and to eat a healthy diet. Nurse will send patient chair exercise printouts and a planning healthy meals booklet.       Plan: RN Health Coach will send PCP a barrier letter and today's assessment note, will send patient chair exercise printouts and a planning healthy meals  booklet, and will call patient within the month of April. Follow-up:  Patient agrees to Care Plan and Follow-up.    [name] RN, BSN [place] [phone] [email or web address]

## 2020-03-21 NOTE — Patient Instructions (Signed)
Goals Addressed            This Visit's Progress   . COMPLETED: DIET - INCREASE WATER INTAKE       Resolved due to duplicate goals.    . COMPLETED: DIET - REDUCE FAST FOOD INTAKE       Resolved due to duplicate goals    . COMPLETED: DIET - REDUCE PORTION SIZE       Resolved due to duplicate goals    . COMPLETED: Exercise 3x per week (30 min per time)       Resolved due to duplicate goals.    . Track and Manage Heart Rate and Rhythm-Atrial Fibrillation       Timeframe:  Long-Range Goal Priority:  High Start Date:  03/20/20                           Expected End Date: 09/12/20                       Follow Up Date 07/13/20    - check pulse (heart) rate once a day - make a plan to exercise regularly - make a plan to eat healthy - take medicine as prescribed    Why is this important?    Atrial fibrillation may have no symptoms. Sometimes the symptoms get worse or happen more often.   It is important to keep track of what your symptoms are and when they happen.   A change in symptoms is important to discuss with your doctor or nurse.   Being active and healthy eating will also help you manage your heart condition.     Notes: Patient reports checking her B/P and pulse several times weekly. She takes her medication as prescribed and reports having a good appetite. Nurse encouraged patient to increase her physical activity, increase her fluid and water intake, and to eat a healthy diet. Nurse will send patient chair exercise printouts and a planning healthy meals booklet.

## 2020-04-01 ENCOUNTER — Ambulatory Visit: Payer: Medicare Other | Admitting: *Deleted

## 2020-04-02 ENCOUNTER — Ambulatory Visit: Payer: Medicare Other | Admitting: Adult Health

## 2020-04-03 ENCOUNTER — Ambulatory Visit (INDEPENDENT_AMBULATORY_CARE_PROVIDER_SITE_OTHER): Payer: Medicare Other | Admitting: Adult Health

## 2020-04-03 ENCOUNTER — Other Ambulatory Visit: Payer: Self-pay

## 2020-04-03 ENCOUNTER — Encounter: Payer: Self-pay | Admitting: Adult Health

## 2020-04-03 VITALS — BP 120/82 | HR 74 | Temp 97.0°F | Ht 70.0 in | Wt 241.0 lb

## 2020-04-03 DIAGNOSIS — E559 Vitamin D deficiency, unspecified: Secondary | ICD-10-CM

## 2020-04-03 DIAGNOSIS — G4737 Central sleep apnea in conditions classified elsewhere: Secondary | ICD-10-CM

## 2020-04-03 DIAGNOSIS — K222 Esophageal obstruction: Secondary | ICD-10-CM

## 2020-04-03 DIAGNOSIS — R0902 Hypoxemia: Secondary | ICD-10-CM

## 2020-04-03 DIAGNOSIS — I4891 Unspecified atrial fibrillation: Secondary | ICD-10-CM

## 2020-04-03 DIAGNOSIS — Z79899 Other long term (current) drug therapy: Secondary | ICD-10-CM

## 2020-04-03 DIAGNOSIS — Z9981 Dependence on supplemental oxygen: Secondary | ICD-10-CM

## 2020-04-03 DIAGNOSIS — I272 Pulmonary hypertension, unspecified: Secondary | ICD-10-CM

## 2020-04-03 DIAGNOSIS — D509 Iron deficiency anemia, unspecified: Secondary | ICD-10-CM

## 2020-04-03 DIAGNOSIS — Z0000 Encounter for general adult medical examination without abnormal findings: Secondary | ICD-10-CM

## 2020-04-03 DIAGNOSIS — G8929 Other chronic pain: Secondary | ICD-10-CM

## 2020-04-03 DIAGNOSIS — I119 Hypertensive heart disease without heart failure: Secondary | ICD-10-CM | POA: Diagnosis not present

## 2020-04-03 DIAGNOSIS — Z86010 Personal history of colonic polyps: Secondary | ICD-10-CM

## 2020-04-03 DIAGNOSIS — K2100 Gastro-esophageal reflux disease with esophagitis, without bleeding: Secondary | ICD-10-CM

## 2020-04-03 DIAGNOSIS — M8949 Other hypertrophic osteoarthropathy, multiple sites: Secondary | ICD-10-CM

## 2020-04-03 DIAGNOSIS — R6889 Other general symptoms and signs: Secondary | ICD-10-CM

## 2020-04-03 DIAGNOSIS — Z0001 Encounter for general adult medical examination with abnormal findings: Secondary | ICD-10-CM

## 2020-04-03 DIAGNOSIS — E782 Mixed hyperlipidemia: Secondary | ICD-10-CM

## 2020-04-03 DIAGNOSIS — F3342 Major depressive disorder, recurrent, in full remission: Secondary | ICD-10-CM

## 2020-04-03 DIAGNOSIS — N3945 Continuous leakage: Secondary | ICD-10-CM

## 2020-04-03 DIAGNOSIS — M5442 Lumbago with sciatica, left side: Secondary | ICD-10-CM

## 2020-04-03 DIAGNOSIS — M159 Polyosteoarthritis, unspecified: Secondary | ICD-10-CM

## 2020-04-03 DIAGNOSIS — G4733 Obstructive sleep apnea (adult) (pediatric): Secondary | ICD-10-CM

## 2020-04-03 DIAGNOSIS — R7309 Other abnormal glucose: Secondary | ICD-10-CM

## 2020-04-03 DIAGNOSIS — R5381 Other malaise: Secondary | ICD-10-CM

## 2020-04-03 DIAGNOSIS — Z9181 History of falling: Secondary | ICD-10-CM

## 2020-04-03 NOTE — Progress Notes (Signed)
MEDICARE WELLNESS  Assessment and Plan:  Encounter for Annual Medicare Wellness visit Due annually   A. Fib with RVR ([place]) Rate controlled on current agents; on elequis without bleeding concerns Cardiology following;   Pulmonary hypertension ([place]) Normal PFTs; abnormal sleep study; now on CPAP  Central sleep apnea associated with a. Fib ([place]) Dr. Brett Fairy following; on auto CPAP with good results; endorses 100% compliance  Morbid obesity ([place]) - BMI 30+ with sleep apnea Long discussion about weight loss, diet, and exercise Recommended diet heavy in fruits and veggies and low in animal meats, cheeses, and dairy products, appropriate calorie intake Discussed appropriate weight for height  She is poorly motivated to work on weight , but agreeable to making small changes; discussed portions for cereal, do more protein/veggie leftovers, alternate with sandwich/chips, portions for ice cream  Weigh once a week and keep log Follow up at next visit  Gastroesophageal reflux disease with esophagitis Well managed on current medications Discussed diet, avoiding triggers and other lifestyle changes  Esophageal stricture Continue GERD medications; follows with GI Discussed diet, avoiding triggers and other lifestyle changes  Primary osteoarthritis involving multiple joints Primarily knees, does fairly,tylenol, flexeril Discussed topical diclofenac/aspercreme Consider gabapentin or ortho referral for injections - declines further interventions at this time   Recurrent major depressive disorder, in full remission ([place]) Continue medications - paxil Lifestyle discussed: diet/exerise, sleep hygiene, stress management, hydration  Chronic low back pain with left-sided sciatica, unspecified back pain laterality Takes tylenol, flexeril -  No longer on oral diclofenac due to heart, discussed topical diclofenac, aspercreme Consider low dose gabapentin if needed; declines for now   History of  colon polyps Repeat colonoscopy 05/2020 per GI - she declined referral today, not planning to pursue further at this time   Anemia, unspecified type Stable; Monitor  Vitamin D deficiency -     Continue supplement  Cholesterol Mild/moderate elevations; low risk history and age; currently managed by lifestyle  Continue low cholesterol diet and exercise.  Defer lipid panel today - too soon to check  Unsteady gait/physical deconditioning/increased risk injury with falls Now on elequis, very sedentary with increased falls/near falls She does not drive Discussed and receptive to home PT for balance, endurance and fall prevention - has been ordered but not able to complete due to current covid 19 peaking;  Discussed and given home exercise information, fall prevention We will reattempt to order home PT in a few months  Discussed med's effects and SE's. Screening labs and tests as requested with regular follow-up as recommended. Over 30 minutes of exam, counseling, chart review, and complex, high level critical decision making was performed this visit.   Future Appointments  Date Time Provider [place]  04/22/2020  4:00 PM [name], MD [facility] None  05/20/2020  3:30 PM [name], NP [facility] None  06/21/2020 10:00 AM Wine, Argie Ramming, RN [facility] None  07/31/2020  2:20 PM Branch, Alphonse Guild, MD [facility]  10/10/2020 11:00 AM [name], MD [facility] None  04/03/2021 10:30 AM [name], NP [facility] None    Plan:   During the course of the visit the patient was educated and counseled about appropriate screening and preventive services including:    Pneumococcal vaccine   Prevnar 13  Influenza vaccine  Td vaccine  Screening electrocardiogram  Bone densitometry screening  Colorectal cancer screening  Diabetes screening  Glaucoma screening  Nutrition counseling   Advanced directives: requested    HPI  80 y.o. female  presents for a  wellness and follow up for has DJD (degenerative joint disease); Chronic lower back pain; Depression; GERD (gastroesophageal reflux disease); Morbid obesity ([place]) - BMI 30+ with OSA; History of colon polyps; Abnormal glucose; Hyperlipidemia, mixed; Vitamin D deficiency; Continuous leakage of urine; Esophageal stricture; Iron deficiency anemia; Atrial fibrillation with RVR ([place]); Hypoxemia requiring supplemental oxygen; Snoring; LVH (left ventricular hypertrophy) due to hypertensive disease, without heart failure; Severe obstructive sleep apnea-hypopnea syndrome; Central sleep apnea associated with atrial fibrillation ([place]); Pulmonary hypertension ([place]); Physical deconditioning; and At high risk for injury related to fall on their problem list.   She has been on paxil for depression for many years and doing well.   She lives with her daughter and family, they drive her when needed. Didn't want to bother getting new driver's license. She watches her 2 young grandchildren.   Patient was hospitalized in Jan/Feb 2021 with new onset Afib with RVR, LVH on ECHO, EF 55-60%, now on oral diltiazem 300 mg daily, lopressor (50 mg AM, 25 mg PM), elequis 5 mg BID. Dr. Harl Bowie is following. Recently reported exertional chest pain, had low risk NM study. Denies CP since. She did have sleep study by Dr. Brett Fairy this year demonstrating central sleep apnea, now on Auto CPAP and does endorse 100% compliance.   She was fairly sedentary at baseline, but reported since admission has done minimal walking, no energy, reports has had several near falls and 1-2 falls though without significant injury. Uses cane for balance. She was referred for home health PT in 01/2020 but hasn't happened due to current pandemic peak, plan to pursue in a few months.   Has chronic intermittent lower back pain, using tylenol, flexeril. Off of oral diclofenac.   She has incontinence, has tried oxybutynin and myrbetriq that did not help. Wears depends  and has declined further interventions.   BMI is Body mass index is 34.58 kg/m., she has been working on diet, eating better living with daughter.  Wt Readings from Last 3 Encounters:  04/03/20 241 lb (109.3 kg)  02/05/20 243 lb (110.2 kg)  01/15/20 240 lb 3.2 oz (109 kg)   Today their BP is BP: 120/82  She does not workout. She denies chest pain, dizziness. Does have exertional dyspnea, ongoing for many years, felt r/t deconditioning.    She is not on cholesterol medication, reports hx of myalgias with unknown medication at one point, declines medications. Her cholesterol is not at goal. The cholesterol last visit was:   Lab Results  Component Value Date   CHOL 208 (H) 01/15/2020   HDL 65 01/15/2020   LDLCALC 120 (H) 01/15/2020   TRIG 120 01/15/2020   CHOLHDL 3.2 01/15/2020    She has not been working on diet and exercise for glucose management. Last A1C in the office was:  Lab Results  Component Value Date   HGBA1C 5.8 (H) 10/10/2019    Last GFR:  Lab Results  Component Value Date   GFRNONAA 71 01/15/2020   Patient is on Vitamin D supplement.   Lab Results  Component Value Date   VD25OH 62 10/10/2019      She has long hx of iron def anemia, Had colonoscopy with polyps 05/2017, EGD 3.2019 by Dr. Fuller Plan, no bleeding or ulcers. currently normal levels and has backed off on chronic iron supplement to every other day, has been on for several years.  CBC Latest Ref Rng & Units 01/15/2020 10/10/2019 05/25/2019  WBC 3.8 - 10.8 Thousand/uL 7.3  6.4 5.8  Hemoglobin 11.7 - 15.5 g/dL 14.2 13.3 12.2  Hematocrit 35.0 - 45.0 % 42.1 39.3 36.2  Platelets 140 - 400 Thousand/uL 222 196 256   Lab Results  Component Value Date   IRON 98 01/15/2020   TIBC 392 01/15/2020   FERRITIN 38 01/15/2020     Current Medications:    Current Outpatient Medications (Cardiovascular):  .  diltiazem (CARDIZEM CD) 300 MG 24 hr capsule, Take 1 capsule (300 mg total) by mouth daily. .  metoprolol  tartrate (LOPRESSOR) 25 MG tablet, Take 50 mg( 2 Tablets )  in the Morning and Take 25 mg ( 1 Tablet)  in the Evening .  nitroGLYCERIN (NITROSTAT) 0.4 MG SL tablet, Place 1 tablet (0.4 mg total) under the tongue every 5 (five) minutes as needed for chest pain.   Current Outpatient Medications (Analgesics):  .  acetaminophen (TYLENOL) 500 MG tablet, Take 500 mg by mouth every 6 (six) hours as needed.  Current Outpatient Medications (Hematological):  .  apixaban (ELIQUIS) 5 MG TABS tablet, Take 1 tablet (5 mg total) by mouth 2 (two) times daily. .  cyanocobalamin 1000 MCG tablet, Take 1,000 mcg by mouth daily. .  ferrous sulfate 325 (65 FE) MG tablet, Take 1 tablet (325 mg total) by mouth every other day.  Current Outpatient Medications (Other):  Marland Kitchen  Ascorbic Acid (VITAMIN C) 500 MG CAPS, Take 500 mg by mouth.  .  CHOLECALCIFEROL PO, Take 5,000 Units by mouth daily. .  cyclobenzaprine (FLEXERIL) 10 MG tablet, Take 1/2 to 1 tablet    2 to 3 x /day if needed for Muscle Spasms .  lansoprazole (PREVACID) 30 MG capsule, Take 1 capsule Daily for Heartburn & Indigestion .  PARoxetine (PAXIL) 20 MG tablet, TAKE 1 TABLET BY MOUTH  DAILY FOR MOOD .  Probiotic Product (PROBIOTIC DAILY PO), Take 1 tablet by mouth daily. Marland Kitchen  zinc gluconate 50 MG tablet, Take 50 mg by mouth daily.  Allergies:  Allergies  Allergen Reactions  . Cephalosporins Rash     Medical History:  She has DJD (degenerative joint disease); Chronic lower back pain; Depression; GERD (gastroesophageal reflux disease); Morbid obesity ([place]) - BMI 30+ with OSA; History of colon polyps; Abnormal glucose; Hyperlipidemia, mixed; Vitamin D deficiency; Continuous leakage of urine; Esophageal stricture; Iron deficiency anemia; Atrial fibrillation with RVR ([place]); Hypoxemia requiring supplemental oxygen; Snoring; LVH (left ventricular hypertrophy) due to hypertensive disease, without heart failure; Severe obstructive sleep apnea-hypopnea syndrome;  Central sleep apnea associated with atrial fibrillation ([place]); Pulmonary hypertension ([place]); Physical deconditioning; and At high risk for injury related to fall on their problem list.   Health Maintenance:   Immunization History  Administered Date(s) Administered  . Fluad Quad(high Dose 65+) 12/04/2019  . Influenza, High Dose Seasonal PF 01/29/2018  . Influenza-Unspecified 12/09/2016, 11/28/2018  . Moderna Sars-Covid-2 Vaccination 03/28/2019, 04/28/2019, 02/27/2020  . Pneumococcal Conjugate-13 02/12/2016, 09/30/2017  . Pneumococcal Polysaccharide-23 03/16/2006, 04/02/2008  . Zoster 07/31/2013   Tetanus: Declines today, cost Pneumovax: 2008, 2010 Prevnar 13: 2017, 2019 Flu vaccine: 11/2019 Zostavax: 2015 Covid 19: 3/3, 2021, moderna   Pap: remote MGM: 01/2020 DEXA: remote, normal, declines further; discussed risk of falls and fractures; she expresses understanding Colonoscopy: 05/2017, polyps  - due 05/2020, she is unsure if will pursue, deferring for now EGD: 05/2017 CXR 03/2019 - small hiatal hernia   Last Dental Exam: Dr. Lynnette Caffey, last visit 2021, goes q17m Last Eye Exam: Dr.? With [facility] care, 2021, mild cataracts  Patient Care  Team: [name], MD as PCP - General (Internal Medicine) Harl Bowie Alphonse Guild, MD as PCP - Cardiology (Cardiology) Michiel Cowboy, RN as [place] Management  Surgical History:  She has a past surgical history that includes Abdominal hysterectomy (03/1979); Cataract extraction, bilateral (Bilateral, 2014); and Breast biopsy (Left, 1986 and 1987). Family History:  Herfamily history includes Dementia in her brother; Heart disease in her father and mother; Hypertension in her daughter, father, and mother; Tremor in her sister. Social History:  She reports that she has never smoked. She has never used smokeless tobacco. She reports current alcohol use of about 3.0 standard drinks of alcohol per week. She reports that she does not use  drugs.  MEDICARE WELLNESS OBJECTIVES: Physical activity: Current Exercise Habits: The patient does not participate in regular exercise at present, Exercise limited by: orthopedic condition(s);cardiac condition(s) Cardiac risk factors: Cardiac Risk Factors include: advanced age (>40men, >91 women);dyslipidemia;hypertension;sedentary lifestyle;obesity (BMI >30kg/m2) Depression/mood screen:   Depression screen [facility] 2/9 04/03/2020  Decreased Interest 0  Down, Depressed, Hopeless 0  PHQ - 2 Score 0  Altered sleeping 0  Tired, decreased energy 1  Change in appetite 0  Feeling bad or failure about yourself  0  Trouble concentrating 0  Moving slowly or fidgety/restless 0  Suicidal thoughts 0  PHQ-9 Score 1  Difficult doing work/chores Not difficult at all    ADLs:  In your present state of health, do you have any difficulty performing the following activities: 04/03/2020 10/09/2019  Hearing? N N  Vision? N N  Difficulty concentrating or making decisions? N N  Walking or climbing stairs? Y N  Comment avoids stairs, 2-3 steps only, can manage slowly with handrails, manages at home -  Dressing or bathing? N N  Doing errands, shopping? Y N  Comment family drives -  Conservation officer, nature and eating ? N -  Using the Toilet? N -  In the past six months, have you accidently leaked urine? Y -  Comment chronic unchanged -  Do you have problems with loss of bowel control? N -  Managing your Medications? N -  Managing your Finances? N -  Housekeeping or managing your Housekeeping? Y -  Comment family assists as needed -  Some recent data might be hidden     Cognitive Testing  Alert? Yes  Normal Appearance?Yes  Oriented to person? Yes  Place? Yes   Time? Yes  Recall of three objects?  Yes  Can perform simple calculations? Yes  Displays appropriate judgment?Yes  Can read the correct time from a watch face?Yes  EOL planning: Does Patient Have a Medical Advance Directive?: Yes Type of Advance  Directive: [place] will Does patient want to make changes to medical advance directive?: No - Patient declined Copy of [place] in Chart?: Yes - validated most recent copy scanned in chart (See row information)    Review of Systems  Constitutional: Negative for malaise/fatigue and weight loss.  HENT: Negative for hearing loss and tinnitus.   Eyes: Negative for blurred vision and double vision.  Respiratory: Negative for cough, shortness of breath and wheezing.   Cardiovascular: Negative for chest pain, palpitations, orthopnea, claudication and leg swelling.  Gastrointestinal: Negative for abdominal pain, blood in stool, constipation, diarrhea, heartburn, melena, nausea and vomiting.  Genitourinary: Negative.   Musculoskeletal: Positive for back pain (chronic intermittent lumbar) and falls (2 in last year, none in last 3 months, no injury). Negative for joint pain and  myalgias.  Skin: Negative for rash.  Neurological: Negative for dizziness, tingling, sensory change, weakness and headaches.  Endo/Heme/Allergies: Negative for polydipsia.  Psychiatric/Behavioral: Negative.   All other systems reviewed and are negative.    Physical Exam: Estimated body mass index is 34.58 kg/m as calculated from the following:   Height as of this encounter: 5\' 10"  (1.778 m).   Weight as of this encounter: 241 lb (109.3 kg). BP 120/82   Pulse 74   Temp (!) 97 F (36.1 C)   Ht 5\' 10"  (1.778 m)   Wt 241 lb (109.3 kg)   SpO2 98%   BMI 34.58 kg/m  General Appearance: Well nourished, in no apparent distress.  Eyes: PERRLA, EOMs, conjunctiva no swelling or erythema, normal fundi and vessels.  Sinuses: No Frontal/maxillary tenderness  ENT/Mouth: Ext aud canals clear, normal light reflex with TMs without erythema, bulging. Mask in place; oral exam deferred. Hearing normal.  Neck: Supple, thyroid normal. No bruits  Respiratory: Respiratory effort normal, BS  equal bilaterally without rales, rhonchi, wheezing or stridor.  Cardio: Irregularly irregular. Brisk peripheral pulses without edema.  Chest: symmetric, with normal excursions and percussion.  Breasts: Symmetric, without lumps, nipple discharge, retractions.  Abdomen: Soft, nontender, no guarding, rebound, hernias, masses, or organomegaly.  Lymphatics: Non tender without lymphadenopathy.  Genitourinary: Defer Musculoskeletal: Full ROM all peripheral extremities, 4/5 strength, and slow steady gait and sitting to standing. Mild kyphosis.  Skin: Warm, dry without rashes, lesions, ecchymosis. Neuro: Cranial nerves intact, reflexes equal bilaterally. Normal muscle tone, no cerebellar symptoms. Sensation intact.  Psych: Awake and oriented X 3, normal affect, Insight and Judgment appropriate.    Medicare Attestation I have personally reviewed: The patient's medical and social history Their use of alcohol, tobacco or illicit drugs Their current medications and supplements The patient's functional ability including ADLs,fall risks, home safety risks, cognitive, and hearing and visual impairment Diet and physical activities Evidence for depression or mood disorders  The patient's weight, height, BMI, and visual acuity have been recorded in the chart.  I have made referrals, counseling, and provided education to the patient based on review of the above and I have provided the patient with a written personalized care plan for preventive services.     Darlene [name] 10:31 AM [facility]

## 2020-04-03 NOTE — Patient Instructions (Addendum)
Darlene [name] , Thank you for taking time to come for your Medicare Wellness Visit. I appreciate your ongoing commitment to your health goals. Please review the following plan we discussed and let me know if I can assist you in the future.   These are the goals we discussed: Goals    . Track and Manage Heart Rate and Rhythm-Atrial Fibrillation     Timeframe:  Long-Range Goal Priority:  High Start Date:  03/20/20                           Expected End Date: 09/12/20                       Follow Up Date 07/13/20    - check pulse (heart) rate once a day - make a plan to exercise regularly - make a plan to eat healthy - take medicine as prescribed    Why is this important?    Atrial fibrillation may have no symptoms. Sometimes the symptoms get worse or happen more often.   It is important to keep track of what your symptoms are and when they happen.   A change in symptoms is important to discuss with your doctor or nurse.   Being active and healthy eating will also help you manage your heart condition.     Notes: Patient reports checking her B/P and pulse several times weekly. She takes her medication as prescribed and reports having a good appetite. Nurse encouraged patient to increase her physical activity, increase her fluid and water intake, and to eat a healthy diet. Nurse will send patient chair exercise printouts and a planning healthy meals booklet.        This is a list of the screening recommended for you and due dates:  Health Maintenance  Topic Date Due  . Tetanus Vaccine  Never done  . DEXA scan (bone density measurement)  Never done  . Colon Cancer Screening  06/07/2020  . Flu Shot  Completed  . COVID-19 Vaccine  Completed  . Pneumonia vaccines  Completed     Try voltaren (diclofenac) gel or aspercreme - 3-4 times a day as needed   Can do with tylenol   Avoid oral diclofenac due to heart and bleeding risks  If not enough, may try gabapentin - let me know     Sit-to-Stand Exercise  The sit-to-stand exercise (also known as the chair stand or chair rise exercise) strengthens your lower body and helps you maintain or improve your mobility and independence. The goal is to do the sit-to-stand exercise without using your hands. This will be easier as you become stronger. You should always talk with your health care provider before starting any exercise program, especially if you have had recent surgery. Do the exercise exactly as told by your health care provider and adjust it as directed. It is normal to feel mild stretching, pulling, tightness, or discomfort as you do this exercise, but you should stop right away if you feel sudden pain or your pain gets worse. Do not begin doing this exercise until told by your health care provider. What the sit-to-stand exercise does The sit-to-stand exercise helps to strengthen the muscles in your thighs and the muscles in the center of your body that give you stability (core muscles). This exercise is especially helpful if:  You have had knee or hip surgery.  You have trouble getting up from a  chair, out of a car, or off the toilet. How to do the sit-to-stand exercise 1. Sit toward the front edge of a sturdy chair without armrests. Your knees should be bent and your feet should be flat on the floor and shoulder-width apart. 2. Place your hands lightly on each side of the seat. Keep your back and neck as straight as possible, with your chest slightly forward. 3. Breathe in slowly. Lean forward and slightly shift your weight to the front of your feet. 4. Breathe out as you slowly stand up. Use your hands as little as possible. 5. Stand and pause for a full breath in and out. 6. Breathe in as you sit down slowly. Tighten your core and abdominal muscles to control your lowering as much as possible. 7. Breathe out slowly. 8. Do this exercise 10-15 times. If needed, do it fewer times until you build up  strength. 9. Rest for 1 minute, then do another set of 10-15 repetitions. To change the difficulty of the sit-to-stand exercise  If the exercise is too difficult, use a chair with sturdy armrests, and push off the armrests to help you come to the standing position. You can also use the armrests to help slowly lower yourself back to sitting. As this gets easier, try to use your arms less. You can also place a firm cushion or pillow on the chair to make the surface higher.  If this exercise is too easy, do not use your arms to help raise or lower yourself. You can also wear a weighted vest, use hand weights, increase your repetitions, or try a lower chair. General tips  You may feel tired when starting an exercise routine. This is normal.  You may have muscle soreness that lasts a few days. This is normal. As you get stronger, you may not feel muscle soreness.  Use smooth, steady movements.  Do not  hold your breath during strength exercises. This can cause unsafe changes in your blood pressure.  Breathe in slowly through your nose, and breathe out slowly through your mouth. Summary  Strengthening your lower body is an important step to help you move safely and independently.  The sit-to-stand exercise helps strengthen the muscles in your thighs and core.  You should always talk with your health care provider before starting any exercise program, especially if you have had recent surgery. This information is not intended to replace advice given to you by your health care provider. Make sure you discuss any questions you have with your health care provider. Document Revised: 12/29/2017 Document Reviewed: 04/23/2016 Elsevier Patient Education  2021 [place].    Exercises to do While Sitting  Exercises that you do while sitting (chair exercises) can give you many of the same benefits as full exercise. Benefits include strengthening your heart, burning calories, and keeping muscles  and joints healthy. Exercise can also improve your mood and help with depression and anxiety. You may benefit from chair exercises if you are unable to do standing exercises because of:  Diabetic foot pain.  Obesity.  Illness.  Arthritis.  Recovery from surgery or injury.  Breathing problems.  Balance problems.  Another type of disability. Before starting chair exercises, check with your health care provider or a physical therapist to find out how much exercise you can tolerate and which exercises are safe for you. If your health care provider approves:  Start out slowly and build up over time. Aim to work up to about 10-20 minutes  for each exercise session.  Make exercise part of your daily routine.  Drink water when you exercise. Do not wait until you are thirsty. Drink every 10-15 minutes.  Stop exercising right away if you have pain, nausea, shortness of breath, or dizziness.  If you are exercising in a wheelchair, make sure to lock the wheels.  Ask your health care provider whether you can do tai chi or yoga. Many positions in these mind-body exercises can be modified to do while seated. Warm-up Before starting other exercises: 1. Sit up as straight as you can. Have your knees bent at 90 degrees, which is the shape of the capital letter "L." Keep your feet flat on the floor. 2. Sit at the front edge of your chair, if you can. 3. Pull in (tighten) the muscles in your abdomen and stretch your spine and neck as straight as you can. Hold this position for a few minutes. 4. Breathe in and out evenly. Try to concentrate on your breathing, and relax your mind. Stretching Exercise A: Arm stretch 1. Hold your arms out straight in front of your body. 2. Bend your hands at the wrist with your fingers pointing up, as if signaling someone to stop. Notice the slight tension in your forearms as you hold the position. 3. Keeping your arms out and your hands bent, rotate your hands  outward as far as you can and hold this stretch. Aim to have your thumbs pointing up and your pinkie fingers pointing down. Slowly repeat arm stretches for one minute as tolerated. Exercise B: Leg stretch 1. If you can move your legs, try to "draw" letters on the floor with the toes of your foot. Write your name with one foot. 2. Write your name with the toes of your other foot. Slowly repeat the movements for one minute as tolerated. Exercise C: Reach for the sky 1. Reach your hands as far over your head as you can to stretch your spine. 2. Move your hands and arms as if you are climbing a rope. Slowly repeat the movements for one minute as tolerated. Range of motion exercises Exercise A: Shoulder roll 1. Let your arms hang loosely at your sides. 2. Lift just your shoulders up toward your ears, then let them relax back down. 3. When your shoulders feel loose, rotate your shoulders in backward and forward circles. Do shoulder rolls slowly for one minute as tolerated. Exercise B: March in place 1. As if you are marching, pump your arms and lift your legs up and down. Lift your knees as high as you can. ? If you are unable to lift your knees, just pump your arms and move your ankles and feet up and down. March in place for one minute as tolerated. Exercise C: Seated jumping jacks 1. Let your arms hang down straight. 2. Keeping your arms straight, lift them up over your head. Aim to point your fingers to the ceiling. 3. While you lift your arms, straighten your legs and slide your heels along the floor to your sides, as wide as you can. 4. As you bring your arms back down to your sides, slide your legs back together. ? If you are unable to use your legs, just move your arms. Slowly repeat seated jumping jacks for one minute as tolerated. Strengthening exercises Exercise A: Shoulder squeeze 1. Hold your arms straight out from your body to your sides, with your elbows bent and your fists  pointed at the ceiling. 2.  Keeping your arms in the bent position, move them forward so your elbows and forearms meet in front of your face. 3. Open your arms back out as wide as you can with your elbows still bent, until you feel your shoulder blades squeezing together. Hold for 5 seconds. Slowly repeat the movements forward and backward for one minute as tolerated. Contact a health care provider if you:  Had to stop exercising due to any of the following: ? Pain. ? Nausea. ? Shortness of breath. ? Dizziness. ? Fatigue.  Have significant pain or soreness after exercising. Get help right away if you have:  Chest pain.  Difficulty breathing. These symptoms may represent a serious problem that is an emergency. Do not wait to see if the symptoms will go away. Get medical help right away. Call your local emergency services (911 in the [place] Do not drive yourself to the hospital. This information is not intended to replace advice given to you by your health care provider. Make sure you discuss any questions you have with your health care provider. Document Revised: 06/29/2019 Document Reviewed: 06/29/2019 Elsevier Patient Education  2021 [place] Prevention in the Home, Adult Falls can cause injuries and can affect people from all age groups. There are many simple things that you can do to make your home safe and to help prevent falls. Ask for help when making these changes, if needed. What actions can I take to prevent falls? General instructions  Use good lighting in all rooms. Replace any light bulbs that burn out.  Turn on lights if it is dark. Use night-lights.  Place frequently used items in easy-to-reach places. Lower the shelves around your home if necessary.  Set up furniture so that there are clear paths around it. Avoid moving your furniture around.  Remove throw rugs and other tripping hazards from the floor.  Avoid walking on wet floors.  Fix  any uneven floor surfaces.  Add color or contrast paint or tape to grab bars and handrails in your home. Place contrasting color strips on the first and last steps of stairways.  When you use a stepladder, make sure that it is completely opened and that the sides are firmly locked. Have someone hold the ladder while you are using it. Do not climb a closed stepladder.  Be aware of any and all pets. What can I do in the bathroom?  Keep the floor dry. Immediately clean up any water that spills onto the floor.  Remove soap buildup in the tub or shower on a regular basis.  Use non-skid mats or decals on the floor of the tub or shower.  Attach bath mats securely with double-sided, non-slip rug tape.  If you need to sit down while you are in the shower, use a plastic, non-slip stool.  Install grab bars by the toilet and in the tub and shower. Do not use towel bars as grab bars.      What can I do in the bedroom?  Make sure that a bedside light is easy to reach.  Do not use oversized bedding that drapes onto the floor.  Have a firm chair that has side arms to use for getting dressed. What can I do in the kitchen?  Clean up any spills right away.  If you need to reach for something above you, use a sturdy step stool that has a grab bar.  Keep electrical cables out of the  way.  Do not use floor polish or wax that makes floors slippery. If you must use wax, make sure that it is non-skid floor wax. What can I do in the stairways?  Do not leave any items on the stairs.  Make sure that you have a light switch at the top of the stairs and the bottom of the stairs. Have them installed if you do not have them.  Make sure that there are handrails on both sides of the stairs. Fix handrails that are broken or loose. Make sure that handrails are as long as the stairways.  Install non-slip stair treads on all stairs in your home.  Avoid having throw rugs at the top or bottom of stairways,  or secure the rugs with carpet tape to prevent them from moving.  Choose a carpet design that does not hide the edge of steps on the stairway.  Check any carpeting to make sure that it is firmly attached to the stairs. Fix any carpet that is loose or worn. What can I do on the outside of my home?  Use bright outdoor lighting.  Regularly repair the edges of walkways and driveways and fix any cracks.  Remove high doorway thresholds.  Trim any shrubbery on the main path into your home.  Regularly check that handrails are securely fastened and in good repair. Both sides of any steps should have handrails.  Install guardrails along the edges of any raised decks or porches.  Clear walkways of debris and clutter, including tools and rocks.  Have leaves, snow, and ice cleared regularly.  Use sand or salt on walkways during winter months.  In the garage, clean up any spills right away, including grease or oil spills. What other actions can I take?  Wear closed-toe shoes that fit well and support your feet. Wear shoes that have rubber soles or low heels.  Use mobility aids as needed, such as canes, walkers, scooters, and crutches.  Review your medicines with your health care provider. Some medicines can cause dizziness or changes in blood pressure, which increase your risk of falling. Talk with your health care provider about other ways that you can decrease your risk of falls. This may include working with a physical therapist or trainer to improve your strength, balance, and endurance. Where to find more information  Centers for Disease Control and Prevention, [employer]: WebmailGuide.co.za  [employer] on Aging: BrainJudge.co.uk Contact a health care provider if:  You are afraid of falling at home.  You feel weak, drowsy, or dizzy at home.  You fall at home. Summary  There are many simple things that you can do to make your home safe and to help prevent  falls.  Ways to make your home safe include removing tripping hazards and installing grab bars in the bathroom.  Ask for help when making these changes in your home. This information is not intended to replace advice given to you by your health care provider. Make sure you discuss any questions you have with your health care provider. Document Revised: 02/12/2017 Document Reviewed: 10/15/2016 Elsevier Patient Education  2021 [employer].

## 2020-04-04 LAB — HEMOGLOBIN A1C
Hgb A1c MFr Bld: 5.9 % of total Hgb — ABNORMAL HIGH (ref <5.7)
Mean Plasma Glucose: 123 mg/dL
eAG (mmol/L): 6.8 mmol/L

## 2020-04-04 LAB — CBC WITH DIFFERENTIAL/PLATELET
Absolute Monocytes: 515 cells/uL (ref 200–950)
Basophils Absolute: 19 cells/uL (ref 0–200)
Basophils Relative: 0.3 %
Eosinophils Absolute: 68 cells/uL (ref 15–500)
Eosinophils Relative: 1.1 %
HCT: 41.9 % (ref 35.0–45.0)
Hemoglobin: 14.4 g/dL (ref 11.7–15.5)
Lymphs Abs: 1432 cells/uL (ref 850–3900)
MCH: 32.4 pg (ref 27.0–33.0)
MCHC: 34.4 g/dL (ref 32.0–36.0)
MCV: 94.4 fL (ref 80.0–100.0)
MPV: 11.6 fL (ref 7.5–12.5)
Monocytes Relative: 8.3 %
Neutro Abs: 4166 cells/uL (ref 1500–7800)
Neutrophils Relative %: 67.2 %
Platelets: 211 10*3/uL (ref 140–400)
RBC: 4.44 10*6/uL (ref 3.80–5.10)
RDW: 12.7 % (ref 11.0–15.0)
Total Lymphocyte: 23.1 %
WBC: 6.2 10*3/uL (ref 3.8–10.8)

## 2020-04-04 LAB — COMPLETE METABOLIC PANEL WITH GFR
AG Ratio: 2 (calc) (ref 1.0–2.5)
ALT: 15 U/L (ref 6–29)
AST: 16 U/L (ref 10–35)
Albumin: 4.2 g/dL (ref 3.6–5.1)
Alkaline phosphatase (APISO): 60 U/L (ref 37–153)
BUN: 17 mg/dL (ref 7–25)
CO2: 28 mmol/L (ref 20–32)
Calcium: 9.5 mg/dL (ref 8.6–10.4)
Chloride: 103 mmol/L (ref 98–110)
Creat: 0.75 mg/dL (ref 0.60–0.88)
GFR, Est African American: 87 mL/min/{1.73_m2} (ref >=60)
GFR, Est Non African American: 75 mL/min/{1.73_m2} (ref >=60)
Globulin: 2.1 g/dL (calc) (ref 1.9–3.7)
Glucose, Bld: 88 mg/dL (ref 65–99)
Potassium: 3.9 mmol/L (ref 3.5–5.3)
Sodium: 141 mmol/L (ref 135–146)
Total Bilirubin: 0.8 mg/dL (ref 0.2–1.2)
Total Protein: 6.3 g/dL (ref 6.1–8.1)

## 2020-04-04 LAB — LIPID PANEL
Cholesterol: 197 mg/dL (ref <200)
HDL: 67 mg/dL (ref >=50)
LDL Cholesterol (Calc): 111 mg/dL (calc) — ABNORMAL HIGH
Non-HDL Cholesterol (Calc): 130 mg/dL (calc) — ABNORMAL HIGH (ref <130)
Total CHOL/HDL Ratio: 2.9 (calc) (ref <5.0)
Triglycerides: 88 mg/dL (ref <150)

## 2020-04-04 LAB — TSH: TSH: 2.79 mIU/L (ref 0.40–4.50)

## 2020-04-04 LAB — MAGNESIUM: Magnesium: 1.8 mg/dL (ref 1.5–2.5)

## 2020-04-04 LAB — VITAMIN D 25 HYDROXY (VIT D DEFICIENCY, FRACTURES): Vit D, 25-Hydroxy: 68 ng/mL (ref 30–100)

## 2020-04-22 ENCOUNTER — Ambulatory Visit: Payer: Medicare Other | Admitting: Internal Medicine

## 2020-05-09 ENCOUNTER — Other Ambulatory Visit: Payer: Self-pay | Admitting: Physician Assistant

## 2020-05-20 ENCOUNTER — Ambulatory Visit: Payer: Medicare Other | Admitting: Family Medicine

## 2020-05-20 ENCOUNTER — Encounter: Payer: Self-pay | Admitting: Family Medicine

## 2020-05-20 VITALS — BP 111/60 | HR 64 | Ht 70.0 in | Wt 239.0 lb

## 2020-05-20 DIAGNOSIS — G4733 Obstructive sleep apnea (adult) (pediatric): Secondary | ICD-10-CM | POA: Diagnosis not present

## 2020-05-20 DIAGNOSIS — Z9989 Dependence on other enabling machines and devices: Secondary | ICD-10-CM

## 2020-05-20 DIAGNOSIS — G4739 Other sleep apnea: Secondary | ICD-10-CM

## 2020-05-20 NOTE — Patient Instructions (Signed)
Please continue using your CPAP regularly. While your insurance requires that you use CPAP at least 4 hours each night on 70% of the nights, I recommend, that you not skip any nights and use it throughout the night if you can. Getting used to CPAP and staying with the treatment long term does take time and patience and discipline. Untreated obstructive sleep apnea when it is moderate to severe can have an adverse impact on cardiovascular health and raise her risk for heart disease, arrhythmias, hypertension, congestive heart failure, stroke and diabetes. Untreated obstructive sleep apnea causes sleep disruption, nonrestorative sleep, and sleep deprivation. This can have an impact on your day to day functioning and cause daytime sleepiness and impairment of cognitive function, memory loss, mood disturbance, and problems focussing. Using CPAP regularly can improve these symptoms.   I will send a mask refitting order to your DME. Please let me know if you do not hear back from them in about a week. Continue using CPAP nightly.   Follow up in 1 year, sooner if needed.   Sleep Apnea Sleep apnea affects breathing during sleep. It causes breathing to stop for a short time or to become shallow. It can also increase the risk of:  Heart attack.  Stroke.  Being very overweight (obese).  Diabetes.  Heart failure.  Irregular heartbeat. The goal of treatment is to help you breathe normally again. What are the causes? There are three kinds of sleep apnea:  Obstructive sleep apnea. This is caused by a blocked or collapsed airway.  Central sleep apnea. This happens when the brain does not send the right signals to the muscles that control breathing.  Mixed sleep apnea. This is a combination of obstructive and central sleep apnea. The most common cause of this condition is a collapsed or blocked airway. This can happen if:  Your throat muscles are too relaxed.  Your tongue and tonsils are too  large.  You are overweight.  Your airway is too small.   What increases the risk?  Being overweight.  Smoking.  Having a small airway.  Being older.  Being female.  Drinking alcohol.  Taking medicines to calm yourself (sedatives or tranquilizers).  Having family members with the condition. What are the signs or symptoms?  Trouble staying asleep.  Being sleepy or tired during the day.  Getting angry a lot.  Loud snoring.  Headaches in the morning.  Not being able to focus your mind (concentrate).  Forgetting things.  Less interest in sex.  Mood swings.  Personality changes.  Feelings of sadness (depression).  Waking up a lot during the night to pee (urinate).  Dry mouth.  Sore throat. How is this diagnosed?  Your medical history.  A physical exam.  A test that is done when you are sleeping (sleep study). The test is most often done in a sleep lab but may also be done at home. How is this treated?  Sleeping on your side.  Using a medicine to get rid of mucus in your nose (decongestant).  Avoiding the use of alcohol, medicines to help you relax, or certain pain medicines (narcotics).  Losing weight, if needed.  Changing your diet.  Not smoking.  Using a machine to open your airway while you sleep, such as: ? An oral appliance. This is a mouthpiece that shifts your lower jaw forward. ? A CPAP device. This device blows air through a mask when you breathe out (exhale). ? An EPAP device. This has valves  that you put in each nostril. ? A BPAP device. This device blows air through a mask when you breathe in (inhale) and breathe out.  Having surgery if other treatments do not work. It is important to get treatment for sleep apnea. Without treatment, it can lead to:  High blood pressure.  Coronary artery disease.  In men, not being able to have an erection (impotence).  Reduced thinking ability.   Follow these instructions at  home: Lifestyle  Make changes that your doctor recommends.  Eat a healthy diet.  Lose weight if needed.  Avoid alcohol, medicines to help you relax, and some pain medicines.  Do not use any products that contain nicotine or tobacco, such as cigarettes, e-cigarettes, and chewing tobacco. If you need help quitting, ask your doctor. General instructions  Take over-the-counter and prescription medicines only as told by your doctor.  If you were given a machine to use while you sleep, use it only as told by your doctor.  If you are having surgery, make sure to tell your doctor you have sleep apnea. You may need to bring your device with you.  Keep all follow-up visits as told by your doctor. This is important. Contact a doctor if:  The machine that you were given to use during sleep bothers you or does not seem to be working.  You do not get better.  You get worse. Get help right away if:  Your chest hurts.  You have trouble breathing in enough air.  You have an uncomfortable feeling in your back, arms, or stomach.  You have trouble talking.  One side of your body feels weak.  A part of your face is hanging down. These symptoms may be an emergency. Do not wait to see if the symptoms will go away. Get medical help right away. Call your local emergency services (911 in the [place] Do not drive yourself to the hospital. Summary  This condition affects breathing during sleep.  The most common cause is a collapsed or blocked airway.  The goal of treatment is to help you breathe normally while you sleep. This information is not intended to replace advice given to you by your health care provider. Make sure you discuss any questions you have with your health care provider. Document Revised: 12/17/2017 Document Reviewed: 10/26/2017 Elsevier Patient Education  [place].

## 2020-05-20 NOTE — Progress Notes (Signed)
PATIENT: Darlene [name] DOB: 1941-02-29  REASON FOR VISIT: follow up HISTORY FROM: patient  Chief Complaint  Patient presents with  . Follow-up    RM 2 alone Pt is well, things are the same she see's no change. Sleeps decent.      HISTORY OF PRESENT ILLNESS: 05/20/20 ALL:  Darlene [name] is a 80 y.o. female here today for follow up for OSA on CPAP.  She is doing well on therapy. She is not sure that she feels any better but recognizes need for therapy. She switched to a nasal pillow but feels it is causing tenderness of her nares. She would like to try a nasal mask. Otherwise, doing very well.      HISTORY: (copied from Dr Dohmeier's previous note)  Rv 11-14-2019-  Darlene Rayborn Pierceis a 80 year old white female patientseen here for a revisit on 11/14/2019 ; I had ordered sleep studies after her last visit here with me which was actually her first and the patient presented on [id number] in lab sleep study which had been ordered requested by her primary care physician as well as by Dr. Bronson Ing her cardiologist at the time.  Patient has known atrial fibrillation congestive due to diastolic heart failure.  Apnea hypopnea index and that study was 11/h REM AHI was 19.9 non-REM AHI was 8.8 and in supine position while sleeping on her back, her AHI was 17.2 overall this was mild apnea but with the lack of oxygen desaturation time.  And in addition it appeared to be a classic apnea slightly more central events and obstructive.  For this reason we did not want to autotitrator also to return for CPAP titration.  That took place on 07-11-19 and here now the patient was put first exposed to CPAP but it did not work CPAP titration left her with a residual apnea index of 10.4 for that reason she had to be titrated to BiPAP CPAP highest pressure tried was 16 cmH2O.   On the BiPAP of 15 under 19 there was no reduction of the apnea either she still had 30 apneas per hour.  The technician gave her a  Mirage Quatro mask and medium full facemask.  She remained was hypoxic oxygen saturation for much of the study diagnosis was severe central sleep apnea now treatment emerging failure to alleviate on the CPAP or BiPAP.  Foot attempts to control her apnea at this time she was titrated to ASV which she responded to beautifully.  A very first sleep study had documented an AHI of 53.7 on May 24, 2019 her ASV study allowed an AHI of 0.0 apneas per hour on the a pressure of 10 cmH2O.   I have now seen her download- she has been placed on auto CPAP and is doing well, we will change the mask form a FFM ( ? Why was she given a FFM?) to the desired nasal mask. She would like an ESON medium mask.    REVIEW OF SYSTEMS: Out of a complete 14 system review of symptoms, the patient complains only of the following symptoms, none and all other reviewed systems are negative.  FSS: 63   ALLERGIES: Allergies  Allergen Reactions  . Cephalosporins Rash    HOME MEDICATIONS: Outpatient Medications Prior to Visit  Medication Sig Dispense Refill  . acetaminophen (TYLENOL) 500 MG tablet Take 500 mg by mouth every 6 (six) hours as needed.    Marland Kitchen apixaban (ELIQUIS) 5 MG TABS tablet Take 1  tablet (5 mg total) by mouth 2 (two) times daily. 180 tablet 3  . Ascorbic Acid (VITAMIN C) 500 MG CAPS Take 500 mg by mouth.     . CHOLECALCIFEROL PO Take 5,000 Units by mouth daily.    . cyanocobalamin 1000 MCG tablet Take 1,000 mcg by mouth daily.    . cyclobenzaprine (FLEXERIL) 10 MG tablet Take 1/2 to 1 tablet    2 to 3 x /day if needed for Muscle Spasms 90 tablet 0  . diltiazem (CARDIZEM CD) 300 MG 24 hr capsule Take 1 capsule (300 mg total) by mouth daily. (Patient taking differently: Take 240 mg by mouth daily.) 90 capsule 3  . ferrous sulfate 325 (65 FE) MG tablet Take 1 tablet (325 mg total) by mouth every other day.    . lansoprazole (PREVACID) 30 MG capsule Take 1 capsule Daily for Heartburn & Indigestion 90 capsule 3   . Magnesium 250 MG TABS Take by mouth.    . metoprolol tartrate (LOPRESSOR) 25 MG tablet TAKE 2 TABLETS BY MOUTH ONCE DAILY IN THE MORNING AND 1 TABLET ONCE DAILY IN THE EVENING 270 tablet 3  . nitroGLYCERIN (NITROSTAT) 0.4 MG SL tablet Place 1 tablet (0.4 mg total) under the tongue every 5 (five) minutes as needed for chest pain. 25 tablet 3  . PARoxetine (PAXIL) 20 MG tablet TAKE 1 TABLET BY MOUTH  DAILY FOR MOOD 90 tablet 3  . Probiotic Product (PROBIOTIC DAILY PO) Take 1 tablet by mouth daily.    Marland Kitchen zinc gluconate 50 MG tablet Take 50 mg by mouth daily.     No facility-administered medications prior to visit.    PAST MEDICAL HISTORY: Past Medical History:  Diagnosis Date  . Anxiety   . Chronic lower back pain   . Depression   . DJD (degenerative joint disease)    knees, neck  . GERD (gastroesophageal reflux disease)    has had dilitation in the past  . Hypertension     PAST SURGICAL HISTORY: Past Surgical History:  Procedure Laterality Date  . ABDOMINAL HYSTERECTOMY  03/1979  . BREAST BIOPSY Left 1986 and 1987  . CATARACT EXTRACTION, BILATERAL Bilateral 2014   Dr. Madelin Headings, in [place]: Family History  Problem Relation Age of Onset  . Hypertension Mother   . Heart disease Mother   . Hypertension Father   . Heart disease Father   . Tremor Sister   . Dementia Brother   . Hypertension Daughter   . Colon cancer Neg Hx   . Esophageal cancer Neg Hx   . Rectal cancer Neg Hx   . Stomach cancer Neg Hx     SOCIAL HISTORY: Social History   Socioeconomic History  . Marital status: Widowed    Spouse name: Not on file  . Number of children: 2  . Years of education: Not on file  . Highest education level: 12th grade  Occupational History  . Occupation: retired    Comment: worked in Academic librarian  . Smoking status: Never Smoker  . Smokeless tobacco: Never Used  Vaping Use  . Vaping Use: Never used  Substance and Sexual Activity  .  Alcohol use: Yes    Alcohol/week: 3.0 standard drinks    Types: 3 Standard drinks or equivalent per week    Comment: 3 nights out of the week  . Drug use: No  . Sexual activity: Not Currently  Other Topics Concern  . Not on file  Social History Narrative  . Not on file   Social Determinants of Health   Financial Resource Strain: Not on file  Food Insecurity: No Food Insecurity  . Worried About Charity fundraiser in the Last Year: Never true  . Ran Out of Food in the Last Year: Never true  Transportation Needs: No Transportation Needs  . Lack of Transportation (Medical): No  . Lack of Transportation (Non-Medical): No  Physical Activity: Not on file  Stress: Not on file  Social Connections: Not on file  Intimate Partner Violence: Not on file     PHYSICAL EXAM  Vitals:   05/20/20 1518  BP: 111/60  Pulse: 64  Weight: 239 lb (108.4 kg)  Height: 5\' 10"  (1.778 m)   Body mass index is 34.29 kg/m.  Generalized: Well developed, in no acute distress  Cardiology: normal rate and irregularly irregular rhythm, no murmur noted Respiratory: clear to auscultation bilaterally  Neurological examination  Mentation: Alert oriented to time, place, history taking. Follows all commands speech and language fluent Cranial nerve II-XII: Pupils were equal round reactive to light. Extraocular movements were full, visual field were full  Motor: The motor testing reveals 5 over 5 strength of all 4 extremities. Good symmetric motor tone is noted throughout.  Gait and station: Gait is arthritic but stable with cane     DIAGNOSTIC DATA (LABS, IMAGING, TESTING) - I reviewed patient records, labs, notes, testing and imaging myself where available.  No flowsheet data found.   Lab Results  Component Value Date   WBC 6.2 04/03/2020   HGB 14.4 04/03/2020   HCT 41.9 04/03/2020   MCV 94.4 04/03/2020   PLT 211 04/03/2020      Component Value Date/Time   NA 141 04/03/2020 1030   K 3.9  04/03/2020 1030   CL 103 04/03/2020 1030   CO2 28 04/03/2020 1030   GLUCOSE 88 04/03/2020 1030   BUN 17 04/03/2020 1030   CREATININE 0.75 04/03/2020 1030   CALCIUM 9.5 04/03/2020 1030   PROT 6.3 04/03/2020 1030   ALBUMIN 2.8 (L) 04/15/2019 1216   AST 16 04/03/2020 1030   ALT 15 04/03/2020 1030   ALKPHOS 77 04/15/2019 1216   BILITOT 0.8 04/03/2020 1030   GFRNONAA 75 04/03/2020 1030   GFRAA 87 04/03/2020 1030   Lab Results  Component Value Date   CHOL 197 04/03/2020   HDL 67 04/03/2020   LDLCALC 111 (H) 04/03/2020   TRIG 88 04/03/2020   CHOLHDL 2.9 04/03/2020   Lab Results  Component Value Date   HGBA1C 5.9 (H) 04/03/2020   Lab Results  Component Value Date   PNTIRWER15 400 03/31/2017   Lab Results  Component Value Date   TSH 2.79 04/03/2020     ASSESSMENT AND PLAN 80 y.o. year old female  has a past medical history of Anxiety, Chronic lower back pain, Depression, DJD (degenerative joint disease), GERD (gastroesophageal reflux disease), and Hypertension. here with     ICD-10-CM   1. OSA on CPAP  G47.33 For home use only DME continuous positive airway pressure (CPAP)   Z99.89 For home use only DME continuous positive airway pressure (CPAP)  2. Treatment-emergent central sleep apnea  G47.39      DAGMAR ADCOX is doing well on CPAP therapy. Compliance report reveals excellent compliance. She will continue to monitor for leak at home. We will send orders for a mask refit due to tenderness of nares with nasal pillow. She was encouraged to continue using  CPAP nightly and for greater than 4 hours each night. We will update supply orders as indicated. Risks of untreated sleep apnea review and education materials provided. Healthy lifestyle habits encouraged. She will follow up in 1 year, sooner if needed. She verbalizes understanding and agreement with this plan.    Orders Placed This Encounter  Procedures  . For home use only DME continuous positive airway pressure (CPAP)     Supplies    Order Specific Question:   Length of Need    Answer:   Lifetime    Order Specific Question:   Patient has OSA or probable OSA    Answer:   Yes    Order Specific Question:   Is the patient currently using CPAP in the home    Answer:   Yes    Order Specific Question:   Settings    Answer:   Other see comments    Order Specific Question:   CPAP supplies needed    Answer:   Mask, headgear, cushions, filters, heated tubing and water chamber  . For home use only DME continuous positive airway pressure (CPAP)    Mask refitting    Order Specific Question:   Length of Need    Answer:   Lifetime    Order Specific Question:   Patient has OSA or probable OSA    Answer:   Yes    Order Specific Question:   Is the patient currently using CPAP in the home    Answer:   Yes    Order Specific Question:   Settings    Answer:   Other see comments    Order Specific Question:   CPAP supplies needed    Answer:   Mask, headgear, cushions, filters, heated tubing and water chamber     No orders of the defined types were placed in this encounter.     I spent 15 minutes with the patient. 50% of this time was spent counseling and educating patient on plan of care and medications.    [name], FNP-C 05/20/2020, 4:00 PM [facility] [address], [place], [place] [address] [phone]

## 2020-05-21 NOTE — Progress Notes (Signed)
Cm sent to [facility]

## 2020-06-06 ENCOUNTER — Telehealth: Payer: Self-pay

## 2020-06-06 ENCOUNTER — Other Ambulatory Visit: Payer: Self-pay | Admitting: Internal Medicine

## 2020-06-06 DIAGNOSIS — K2100 Gastro-esophageal reflux disease with esophagitis, without bleeding: Secondary | ICD-10-CM

## 2020-06-06 MED ORDER — APIXABAN 5 MG PO TABS
5.0000 mg | ORAL_TABLET | Freq: Two times a day (BID) | ORAL | 3 refills | Status: DC
Start: 2020-06-06 — End: 2021-06-17

## 2020-06-06 NOTE — Telephone Encounter (Signed)
Medication refill request for Eliquis 5 mg tablets approved and sent to [facility].

## 2020-06-21 ENCOUNTER — Other Ambulatory Visit: Payer: Self-pay | Admitting: *Deleted

## 2020-06-21 NOTE — Patient Instructions (Addendum)
Goals Addressed            This Visit's Progress   . [facility]) Patient will verbalize taking her B/P and pulse 2-3 a week and recording the values.       Timeframe:  Long-Range Goal Priority:  High Start Date:  03/20/20                           Expected End Date: 04/14/21                       Follow Up Date 10/12/20   - make a plan to exercise regularly - make a plan to eat healthy - take medicine as prescribed  -Discussed with patient to take her B/P, pulse and record the values 2-3 times weekly -Discussed with patient to do chair exercises once weekly  -Encouraged patient to continue to drink 1 bottle of water daily and to limit salt intake  Why is this important?    Atrial fibrillation may have no symptoms. Sometimes the symptoms get worse or happen more often.   It is important to keep track of what your symptoms are and when they happen.   A change in symptoms is important to discuss with your doctor or nurse.   Being active and healthy eating will also help you manage your heart condition.     Notes: Patient reports checking her B/P and pulse several times weekly. She takes her medication as prescribed and reports having a good appetite. Nurse encouraged patient to increase her physical activity, increase her fluid and water intake, and to eat a healthy diet. Nurse will send patient chair exercise printouts and a planning healthy meals booklet.   Update 06/21/20: Patient states she will take her B/P, pulse, and record the values 2-3 times weekly. Patient states she will do chair exercises once weekly, drink a bottle of water daily, and limit her salt intake as much as possible. Nurse will send patient chair exercise printouts, a planning healthy meals booklet, and a calendar booklet.    Marland Kitchen DIET - INCREASE WATER INTAKE      . DIET - REDUCE FAST FOOD INTAKE      . DIET - REDUCE PORTION SIZE      . Exercise 3x per week (30 min per time)         Atrial Fibrillation  Atrial  fibrillation is a type of heartbeat that is irregular or fast. If you have this condition, your heart beats without any order. This makes it hard for your heart to pump blood in a normal way. Atrial fibrillation may come and go, or it may become a long-lasting problem. If this condition is not treated, it can put you at higher risk for stroke, heart failure, and other heart problems. What are the causes? This condition may be caused by diseases that damage the heart. They include:  High blood pressure.  Heart failure.  Heart valve disease.  Heart surgery. Other causes include:  Diabetes.  Thyroid disease.  Being overweight.  Kidney disease. Sometimes the cause is not known. What increases the risk? You are more likely to develop this condition if:  You are older.  You smoke.  You exercise often and very hard.  You have a family history of this condition.  You are a man.  You use drugs.  You drink a lot of alcohol.  You have lung conditions, such as emphysema,  pneumonia, or COPD.  You have sleep apnea. What are the signs or symptoms? Common symptoms of this condition include:  A feeling that your heart is beating very fast.  Chest pain or discomfort.  Feeling short of breath.  Suddenly feeling light-headed or weak.  Getting tired easily during activity.  Fainting.  Sweating. In some cases, there are no symptoms. How is this treated? Treatment for this condition depends on underlying conditions and how you feel when you have atrial fibrillation. They include:  Medicines to: ? Prevent blood clots. ? Treat heart rate or heart rhythm problems.  Using devices, such as a pacemaker, to correct heart rhythm problems.  Doing surgery to remove the part of the heart that sends bad signals.  Closing an area where clots can form in the heart (left atrial appendage). In some cases, your doctor will treat other underlying conditions. Follow these instructions  at home: Medicines  Take over-the-counter and prescription medicines only as told by your doctor.  Do not take any new medicines without first talking to your doctor.  If you are taking blood thinners: ? Talk with your doctor before you take any medicines that have aspirin or NSAIDs, such as ibuprofen, in them. ? Take your medicine exactly as told by your doctor. Take it at the same time each day. ? Avoid activities that could hurt or bruise you. Follow instructions about how to prevent falls. ? Wear a bracelet that says you are taking blood thinners. Or, carry a card that lists what medicines you take. Lifestyle  Do not use any products that have nicotine or tobacco in them. These include cigarettes, e-cigarettes, and chewing tobacco. If you need help quitting, ask your doctor.  Eat heart-healthy foods. Talk with your doctor about the right eating plan for you.  Exercise regularly as told by your doctor.  Do not drink alcohol.  Lose weight if you are overweight.  Do not use drugs, including cannabis.      General instructions  If you have a condition that causes breathing to stop for a short period of time (apnea), treat it as told by your doctor.  Keep a healthy weight. Do not use diet pills unless your doctor says they are safe for you. Diet pills may make heart problems worse.  Keep all follow-up visits as told by your doctor. This is important. Contact a doctor if:  You notice a change in the speed, rhythm, or strength of your heartbeat.  You are taking a blood-thinning medicine and you get more bruising.  You get tired more easily when you move or exercise.  You have a sudden change in weight. Get help right away if:  You have pain in your chest or your belly (abdomen).  You have trouble breathing.  You have side effects of blood thinners, such as blood in your vomit, poop (stool), or pee (urine), or bleeding that cannot stop.  You have any signs of a stroke.  "BE FAST" is an easy way to remember the main warning signs: ? B - Balance. Signs are dizziness, sudden trouble walking, or loss of balance. ? E - Eyes. Signs are trouble seeing or a change in how you see. ? F - Face. Signs are sudden weakness or loss of feeling in the face, or the face or eyelid drooping on one side. ? A - Arms. Signs are weakness or loss of feeling in an arm. This happens suddenly and usually on one side of the body. ?  S - Speech. Signs are sudden trouble speaking, slurred speech, or trouble understanding what people say. ? T - Time. Time to call emergency services. Write down what time symptoms started.  You have other signs of a stroke, such as: ? A sudden, very bad headache with no known cause. ? Feeling like you may vomit (nausea). ? Vomiting. ? A seizure. These symptoms may be an emergency. Do not wait to see if the symptoms will go away. Get medical help right away. Call your local emergency services (911 in the [place] Do not drive yourself to the hospital.   Summary  Atrial fibrillation is a type of heartbeat that is irregular or fast.  You are at higher risk of this condition if you smoke, are older, have diabetes, or are overweight.  Follow your doctor's instructions about medicines, diet, exercise, and follow-up visits.  Get help right away if you have signs or symptoms of a stroke.  Get help right away if you cannot catch your breath, or you have chest pain or discomfort. This information is not intended to replace advice given to you by your health care provider. Make sure you discuss any questions you have with your health care provider. Document Revised: 08/24/2018 Document Reviewed: 08/24/2018 Elsevier Patient Education  [place].

## 2020-06-21 NOTE — Patient Outreach (Signed)
[facility]) Care Management  [place]  06/21/2020   Darlene [name] Oct 31, 1940 [id number]  Subjective: Successful telephone outreach call to patient. HIPAA identifiers obtained. Patient states she is doing well. She reports no recent falls and explains her energy level has improved. Patient states that she has not been taking her pulse and B/P routinely. Nurse provided education and patient stated that she will start to take her pulse and B/P 2-3 times weekly and record the values.  Patient reports that she does stay active interacting with 2 small children that her daughter cares for 5 days a week and she will also try to do chair exercises once weekly to maintain her strength and mobility. Nurse encouraged patient to continue to drink a bottle of water daily and to try to limit her salt intake. Patient states that she is in a good place emotionally and has enjoyed the outdoors now that it is Spring. Patient did not have any further questions or concerns today and did confirm that she has this nurse's contact number to call her if needed.   Encounter Medications:  Outpatient Encounter Medications as of 06/21/2020  Medication Sig Note  . acetaminophen (TYLENOL) 500 MG tablet Take 500 mg by mouth every 6 (six) hours as needed.   Marland Kitchen apixaban (ELIQUIS) 5 MG TABS tablet Take 1 tablet (5 mg total) by mouth 2 (two) times daily.   . Ascorbic Acid (VITAMIN C) 500 MG CAPS Take 500 mg by mouth.    . CHOLECALCIFEROL PO Take 5,000 Units by mouth daily. 03/31/2018: 7,000units  . cyanocobalamin 1000 MCG tablet Take 1,000 mcg by mouth daily.   . cyclobenzaprine (FLEXERIL) 10 MG tablet Take 1/2 to 1 tablet    2 to 3 x /day if needed for Muscle Spasms   . diltiazem (CARDIZEM CD) 300 MG 24 hr capsule Take 1 capsule (300 mg total) by mouth daily. (Patient taking differently: Take 240 mg by mouth daily.)   . ferrous sulfate 325 (65 FE) MG tablet Take 1 tablet (325 mg total) by mouth every other  day.   . lansoprazole (PREVACID) 30 MG capsule TAKE 1 CAPSULE BY MOUTH  DAILY FOR HEARTBURN AND  INDIGESTION   . Magnesium 250 MG TABS Take by mouth.   . metoprolol tartrate (LOPRESSOR) 25 MG tablet TAKE 2 TABLETS BY MOUTH ONCE DAILY IN THE MORNING AND 1 TABLET ONCE DAILY IN THE EVENING   . nitroGLYCERIN (NITROSTAT) 0.4 MG SL tablet Place 1 tablet (0.4 mg total) under the tongue every 5 (five) minutes as needed for chest pain.   Marland Kitchen PARoxetine (PAXIL) 20 MG tablet TAKE 1 TABLET BY MOUTH  DAILY FOR MOOD   . Probiotic Product (PROBIOTIC DAILY PO) Take 1 tablet by mouth daily.   Marland Kitchen zinc gluconate 50 MG tablet Take 50 mg by mouth daily.    No facility-administered encounter medications on file as of 06/21/2020.    Functional Status:  In your present state of health, do you have any difficulty performing the following activities: 04/03/2020 10/09/2019  Hearing? N N  Vision? N N  Difficulty concentrating or making decisions? N N  Walking or climbing stairs? Y N  Comment avoids stairs, 2-3 steps only, can manage slowly with handrails, manages at home -  Dressing or bathing? N N  Doing errands, shopping? Y N  Comment family drives -  Conservation officer, nature and eating ? N -  Using the Toilet? N -  In the past six months,  have you accidently leaked urine? Y -  Comment chronic unchanged -  Do you have problems with loss of bowel control? N -  Managing your Medications? N -  Managing your Finances? N -  Housekeeping or managing your Housekeeping? Y -  Comment family assists as needed -  Some recent data might be hidden    Fall/Depression Screening: Fall Risk  06/21/2020 04/03/2020 03/20/2020  Falls in the past year? 1 1 1   Number falls in past yr: 1 0 1  Comment - - -  Injury with Fall? 0 0 0  Risk for fall due to : - History of fall(s);Impaired mobility;Orthopedic patient -  Follow up - Falls evaluation completed;Education provided;Falls prevention discussed;Follow up appointment -   PHQ 2/9 Scores  04/03/2020 03/20/2020 10/09/2019 05/28/2019 04/28/2019 04/03/2019 09/04/2018  PHQ - 2 Score 0 0 0 0 2 0 0  PHQ- 9 Score 1 - - - 4 - -    Assessment:  Goals Addressed            This Visit's Progress   . [facility]) Patient will verbalize taking her B/P and pulse 2-3 a week and recording the values.       Timeframe:  Long-Range Goal Priority:  High Start Date:  03/20/20                           Expected End Date: 04/14/21                       Follow Up Date 10/12/20   - make a plan to exercise regularly - make a plan to eat healthy - take medicine as prescribed  -Discussed with patient to take her B/P, pulse and record the values 2-3 times weekly -Discussed with patient to do chair exercises once weekly  -Encouraged patient to continue to drink 1 bottle of water daily and to limit salt intake  Why is this important?    Atrial fibrillation may have no symptoms. Sometimes the symptoms get worse or happen more often.   It is important to keep track of what your symptoms are and when they happen.   A change in symptoms is important to discuss with your doctor or nurse.   Being active and healthy eating will also help you manage your heart condition.     Notes: Patient reports checking her B/P and pulse several times weekly. She takes her medication as prescribed and reports having a good appetite. Nurse encouraged patient to increase her physical activity, increase her fluid and water intake, and to eat a healthy diet. Nurse will send patient chair exercise printouts and a planning healthy meals booklet.   Update 06/21/20: Patient states she will take her B/P, pulse, and record the values 2-3 times weekly. Patient states she will do chair exercises once weekly, drink a bottle of water daily, and limit her salt intake as much as possible. Nurse will send patient chair exercise printouts, a planning healthy meals booklet, and a calendar booklet.    Marland Kitchen DIET - INCREASE WATER INTAKE      . DIET - REDUCE  FAST FOOD INTAKE      . DIET - REDUCE PORTION SIZE      . Exercise 3x per week (30 min per time)        Plan: [facility] will send PCP quarterly report, will send patient a calendar booklet, chair exercise printouts, and  planning healthy meals booklet, and will call patient within the month of June. Follow-up:  Patient agrees to Care Plan and Follow-up.  [name] RN, BSN [place] [phone] [email or web address]

## 2020-07-30 ENCOUNTER — Encounter: Payer: Self-pay | Admitting: Gastroenterology

## 2020-07-31 ENCOUNTER — Ambulatory Visit: Payer: Medicare Other | Admitting: Cardiology

## 2020-07-31 ENCOUNTER — Encounter: Payer: Self-pay | Admitting: Cardiology

## 2020-07-31 ENCOUNTER — Other Ambulatory Visit: Payer: Self-pay

## 2020-07-31 VITALS — BP 110/70 | HR 76 | Ht 70.0 in | Wt 237.0 lb

## 2020-07-31 DIAGNOSIS — R0789 Other chest pain: Secondary | ICD-10-CM | POA: Diagnosis not present

## 2020-07-31 DIAGNOSIS — I4891 Unspecified atrial fibrillation: Secondary | ICD-10-CM | POA: Diagnosis not present

## 2020-07-31 NOTE — Patient Instructions (Signed)

## 2020-07-31 NOTE — Progress Notes (Signed)
Clinical Summary [name] is a 80 y.o.female seen today for follow up of the following medical problems.   1. Afib - due to LAE rate control strategy has been pursued   - no recent palpitations.  - compliant with meds - no bleeding on eliqus.     2. Chest pain - occurs with activity. Feeling of weight on chest. Mid chest, 8/10 in severity. Stops and rest, symptoms resolve    11/2019 nuclear stress: no ischemia - ongoing heaviness at times  3. Pulmonary HTN - PFTs were benign - sleep study did show severe OSA, likely etiology.  - compliant cpap machine. Followed by Darlene [name] Neuro     Past Medical History:  Diagnosis Date  . Anxiety   . Chronic lower back pain   . Depression   . DJD (degenerative joint disease)    knees, neck  . GERD (gastroesophageal reflux disease)    has had dilitation in the past  . Hypertension      Allergies  Allergen Reactions  . Cephalosporins Rash     Current Outpatient Medications  Medication Sig Dispense Refill  . acetaminophen (TYLENOL) 500 MG tablet Take 500 mg by mouth every 6 (six) hours as needed.    Marland Kitchen apixaban (ELIQUIS) 5 MG TABS tablet Take 1 tablet (5 mg total) by mouth 2 (two) times daily. 180 tablet 3  . Ascorbic Acid (VITAMIN C) 500 MG CAPS Take 500 mg by mouth.     . CHOLECALCIFEROL PO Take 5,000 Units by mouth daily.    . cyanocobalamin 1000 MCG tablet Take 1,000 mcg by mouth daily.    . cyclobenzaprine (FLEXERIL) 10 MG tablet Take 1/2 to 1 tablet    2 to 3 x /day if needed for Muscle Spasms 90 tablet 0  . diltiazem (CARDIZEM CD) 300 MG 24 hr capsule Take 1 capsule (300 mg total) by mouth daily. (Patient taking differently: Take 240 mg by mouth daily.) 90 capsule 3  . ferrous sulfate 325 (65 FE) MG tablet Take 1 tablet (325 mg total) by mouth every other day.    . lansoprazole (PREVACID) 30 MG capsule TAKE 1 CAPSULE BY MOUTH  DAILY FOR HEARTBURN AND  INDIGESTION 90 capsule 3  . Magnesium 250 MG TABS Take  by mouth.    . metoprolol tartrate (LOPRESSOR) 25 MG tablet TAKE 2 TABLETS BY MOUTH ONCE DAILY IN THE MORNING AND 1 TABLET ONCE DAILY IN THE EVENING 270 tablet 3  . nitroGLYCERIN (NITROSTAT) 0.4 MG SL tablet Place 1 tablet (0.4 mg total) under the tongue every 5 (five) minutes as needed for chest pain. 25 tablet 3  . PARoxetine (PAXIL) 20 MG tablet TAKE 1 TABLET BY MOUTH  DAILY FOR MOOD 90 tablet 3  . Probiotic Product (PROBIOTIC DAILY PO) Take 1 tablet by mouth daily.    Marland Kitchen zinc gluconate 50 MG tablet Take 50 mg by mouth daily.     No current facility-administered medications for this visit.     Past Surgical History:  Procedure Laterality Date  . ABDOMINAL HYSTERECTOMY  03/1979  . BREAST BIOPSY Left 1986 and 1987  . CATARACT EXTRACTION, BILATERAL Bilateral 2014   Dr. Madelin Headings, in [facility]     Allergies  Allergen Reactions  . Cephalosporins Rash      Family History  Problem Relation Age of Onset  . Hypertension Mother   . Heart disease Mother   . Hypertension Father   . Heart disease Father   .  Tremor Sister   . Dementia Brother   . Hypertension Daughter   . Colon cancer Neg Hx   . Esophageal cancer Neg Hx   . Rectal cancer Neg Hx   . Stomach cancer Neg Hx      Social History Darlene [name] reports that she has never smoked. She has never used smokeless tobacco. Darlene [name] reports current alcohol use of about 3.0 standard drinks of alcohol per week.   Review of Systems CONSTITUTIONAL: No weight loss, fever, chills, weakness or fatigue.  HEENT: Eyes: No visual loss, blurred vision, double vision or yellow sclerae.No hearing loss, sneezing, congestion, runny nose or sore throat.  SKIN: No rash or itching.  CARDIOVASCULAR: per hpi RESPIRATORY: No shortness of breath, cough or sputum.  GASTROINTESTINAL: No anorexia, nausea, vomiting or diarrhea. No abdominal pain or blood.  GENITOURINARY: No burning on urination, no polyuria NEUROLOGICAL: No headache, dizziness,  syncope, paralysis, ataxia, numbness or tingling in the extremities. No change in bowel or bladder control.  MUSCULOSKELETAL: No muscle, back pain, joint pain or stiffness.  LYMPHATICS: No enlarged nodes. No history of splenectomy.  PSYCHIATRIC: No history of depression or anxiety.  ENDOCRINOLOGIC: No reports of sweating, cold or heat intolerance. No polyuria or polydipsia.  Marland Kitchen   Physical Examination Today's Vitals   07/31/20 1418  BP: 110/70  Pulse: 76  SpO2: 96%  Weight: 237 lb (107.5 kg)  Height: 5\' 10"  (1.778 m)   Body mass index is 34.01 kg/m.  Gen: resting comfortably, no acute distress HEENT: no scleral icterus, pupils equal round and reactive, no palptable cervical adenopathy,  CV: irreg, no m/r/g no jvd Resp: Clear to auscultation bilaterally GI: abdomen is soft, non-tender, non-distended, normal bowel sounds, no hepatosplenomegaly MSK: extremities are warm, no edema.  Skin: warm, no rash Neuro:  no focal deficits Psych: appropriate affect   Diagnostic Studies  Echo 04/16/19 IMPRESSIONS    1. Left ventricular ejection fraction, by visual estimation, is 55 to  60%. The left ventricle has normal function. There is mildly increased  left ventricular hypertrophy.  2. Left ventricular diastolic parameters are indeterminate.  3. The left ventricle has no regional wall motion abnormalities.  4. Global right ventricle has normal systolic function.The right  ventricular size is moderately enlarged. No increase in right ventricular  wall thickness.  5. Left atrial size was severely dilated.  6. Right atrial size was moderately dilated.  7. The mitral valve is normal in structure. Mild mitral valve  regurgitation. No evidence of mitral stenosis.  8. The tricuspid valve is normal in structure.  9. The tricuspid valve is normal in structure. Tricuspid valve  regurgitation is mild.  10. The aortic valve is tricuspid. Aortic valve regurgitation is not   visualized. No evidence of aortic valve sclerosis or stenosis.  11. The pulmonic valve was not well visualized. Pulmonic valve  regurgitation is not visualized.  12. Moderately elevated pulmonary artery systolic pressure.  13. The inferior vena cava is dilated in size with <50% respiratory  variability, suggesting right atrial pressure of 15 mmHg.    11/2019 nuclear stress  There was no ST segment deviation noted during stress.  The study is normal. There are no perfusion defect consistentw with prior infarct or current ischemia.  This is a low risk study.  The left ventricular ejection fraction is normal (55-65%).     Assessment and Plan  1. Afib - denies symptoms, continue current meds including anticoag  2. Chest pain - ongoing symptoms at  times, nonspecific chest heaviness. Recent lexiscan was normal. No further cardiac workup    Arnoldo Lenis, M.D.

## 2020-08-07 ENCOUNTER — Emergency Department (HOSPITAL_COMMUNITY): Payer: Medicare Other

## 2020-08-07 ENCOUNTER — Encounter (HOSPITAL_COMMUNITY): Payer: Self-pay | Admitting: Emergency Medicine

## 2020-08-07 ENCOUNTER — Emergency Department (HOSPITAL_COMMUNITY)
Admission: EM | Admit: 2020-08-07 | Discharge: 2020-08-07 | Disposition: A | Discharge status: Home / Self Care | Payer: Medicare Other | Attending: Emergency Medicine | Admitting: Emergency Medicine

## 2020-08-07 ENCOUNTER — Other Ambulatory Visit: Payer: Self-pay

## 2020-08-07 DIAGNOSIS — R110 Nausea: Secondary | ICD-10-CM | POA: Diagnosis not present

## 2020-08-07 DIAGNOSIS — Z7901 Long term (current) use of anticoagulants: Secondary | ICD-10-CM | POA: Insufficient documentation

## 2020-08-07 DIAGNOSIS — Z79899 Other long term (current) drug therapy: Secondary | ICD-10-CM | POA: Insufficient documentation

## 2020-08-07 DIAGNOSIS — I10 Essential (primary) hypertension: Secondary | ICD-10-CM | POA: Diagnosis not present

## 2020-08-07 DIAGNOSIS — R0602 Shortness of breath: Secondary | ICD-10-CM | POA: Diagnosis not present

## 2020-08-07 DIAGNOSIS — R0789 Other chest pain: Secondary | ICD-10-CM | POA: Insufficient documentation

## 2020-08-07 DIAGNOSIS — R079 Chest pain, unspecified: Secondary | ICD-10-CM

## 2020-08-07 LAB — BASIC METABOLIC PANEL
Anion gap: 7 (ref 5–15)
BUN: 17 mg/dL (ref 8–23)
CO2: 27 mmol/L (ref 22–32)
Calcium: 9.2 mg/dL (ref 8.9–10.3)
Chloride: 105 mmol/L (ref 98–111)
Creatinine, Ser: 0.64 mg/dL (ref 0.44–1.00)
GFR, Estimated: >60 mL/min (ref >60)
Glucose, Bld: 92 mg/dL (ref 70–99)
Potassium: 3.8 mmol/L (ref 3.5–5.1)
Sodium: 139 mmol/L (ref 135–145)

## 2020-08-07 LAB — CBC
HCT: 44.4 % (ref 36.0–46.0)
Hemoglobin: 14.5 g/dL (ref 12.0–15.0)
MCH: 33.2 pg (ref 26.0–34.0)
MCHC: 32.7 g/dL (ref 30.0–36.0)
MCV: 101.6 fL — ABNORMAL HIGH (ref 80.0–100.0)
Platelets: 179 10*3/uL (ref 150–400)
RBC: 4.37 MIL/uL (ref 3.87–5.11)
RDW: 12.9 % (ref 11.5–15.5)
WBC: 6.5 10*3/uL (ref 4.0–10.5)
nRBC: 0 % (ref 0.0–0.2)

## 2020-08-07 LAB — TROPONIN I (HIGH SENSITIVITY)
Troponin I (High Sensitivity): 3 ng/L (ref <18)
Troponin I (High Sensitivity): 3 ng/L (ref <18)

## 2020-08-07 NOTE — ED Notes (Signed)
Assist pt to bathroom via wheelchair  

## 2020-08-07 NOTE — ED Provider Notes (Signed)
Emergency Medicine Provider Triage Evaluation Note  Darlene [name] , a 80 y.o. female  was evaluated in triage.  Patient with history of atrial fibrillation with RVR in the ED with complaints of sharp intermittent chest pain that began this morning.  She states that when it first came on it lasted approximately 5 to 10 minutes before subsiding.  She thought it was perhaps GERD, but she denies having had it happen recently.  Her daughter advised her to come to the ED for evaluation.  She is followed by Dr. Harl Bowie here at [facility].  She denies feeling short of breath beyond baseline.  Denies recent fevers or chills.  Review of Systems  Positive: Intermittent right-sided "sharp" chest pain, varying duration.  Longest 5 to 10 minutes. Negative: New shortness of breath, diaphoresis, nausea or vomiting, syncope, fevers or chills, cough, abdominal pain.  Physical Exam  BP 126/63   Pulse 65   Temp 98 F (36.7 C)   Resp 18   Ht 5\' 10"  (1.778 m)   Wt 106.6 kg   SpO2 98%   BMI 33.72 kg/m  Gen:   Awake, no distress   Resp:  Normal effort  MSK:   Moves extremities without difficulty   Medical Decision Making  Medically screening exam initiated at 11:07 AM.  Appropriate orders placed.  Darlene [name] was informed that the remainder of the evaluation will be completed by another provider, this initial triage assessment does not replace that evaluation, and the importance of remaining in the ED until their evaluation is complete.  She understands the importance of waiting for more comprehensive evaluation.  Basic laboratory work-up and imaging has been ordered.   Corena Herter, PA-C 08/07/20 1114    Luna Fuse, MD 08/10/20 2016

## 2020-08-07 NOTE — ED Triage Notes (Signed)
C/o sharp intermittent cp this am. Reports she has chronic afib with chronic fatigue. Denies SOB.

## 2020-08-07 NOTE — Discharge Instructions (Signed)
Please follow-up with your primary care provider as well as your cardiologist, Dr. Harl Bowie, regarding today's ED encounter for intermittent right-sided sharp chest discomfort.  You described this as feeling like reflux disease.  Please be sure to continue taking your at home antacids.  If you are not on any, I recommend omeprazole 20 mg twice daily.  Please discuss this with your primary care provider.  You may try Maalox over-the-counter as needed as an abortive therapy.  Chest x-ray obtained here in the ED did redemonstrate your large hiatal hernia.  The remainder of your work-up was reassuring.  You have had extensive cardiac work-up in the past year which has been largely benign.  Your description of chest discomfort today is atypical for acute coronary syndrome.    Regardless, you can never be too careful with chest pain.  Please return to the ED or seek immediate medical attention should you experience any new or worsening symptoms.

## 2020-08-07 NOTE — ED Provider Notes (Signed)
[facility] EMERGENCY DEPARTMENT Provider Note   CSN: [id number] Arrival date & time: 08/07/20  1022     History Chief Complaint  Patient presents with  . Chest Pain    Darlene [name] is a 80 y.o. female with past medical history of HTN and atrial fibrillation with RVR who presented to the ED with sudden onset sharp, intermittent chest pain that began this morning.    Initial episode lasted approximately 5 to 10 minutes before subsiding.  She has had multiple episodes of shorter duration since, including on her initial arrival to the ED.  She states that she thought that this was perhaps related to her history of GERD, but given that it is relatively well controlled was concerned for cardiac etiology.  Her daughter advised her to come to the ED for evaluation.  She is followed by Dr. Harl Bowie with [facility].  Patient had mild associated nausea with her onset of chest discomfort that she described as "sharp", but denies emesis, diaphoresis, shortness of breath, lightheadedness or dizziness, or any other symptoms.  It occurred at rest.  She states that she is chronically short of breath, denies feeling short of breath beyond baseline.  Denies any recent fevers, chills, cough, or any other symptoms.  She states that she has been taking her Eliquis, as directed.  Denies any history of clots or clotting disorder.  Denies hemoptysis.  No recent travels or immobilizations.  HPI   Past Medical History:  Diagnosis Date  . Anxiety   . Chronic lower back pain   . Depression   . DJD (degenerative joint disease)    knees, neck  . GERD (gastroesophageal reflux disease)    has had dilitation in the past  . Hypertension     Patient Active Problem List   Diagnosis Date Noted  . Physical deconditioning 01/15/2020  . At high risk for injury related to fall 01/15/2020  . Pulmonary hypertension ([place]) 01/12/2020  . Central sleep apnea associated with atrial fibrillation ([place]) 06/26/2019  .  Severe obstructive sleep apnea-hypopnea syndrome 05/31/2019  . Hypoxemia requiring supplemental oxygen 05/09/2019  . Snoring 05/09/2019  . LVH (left ventricular hypertrophy) due to hypertensive disease, without heart failure 05/09/2019  . Atrial fibrillation with RVR ([place]) 04/15/2019  . Esophageal stricture 09/04/2018  . Iron deficiency anemia 09/04/2018  . Continuous leakage of urine 02/17/2018  . Vitamin D deficiency 02/16/2018  . Hyperlipidemia, mixed 10/01/2017  . Abnormal glucose 09/30/2017  . Morbid obesity ([place]) - BMI 30+ with OSA 09/29/2017  . History of colon polyps 09/29/2017  . GERD (gastroesophageal reflux disease) 04/01/2017  . DJD (degenerative joint disease)   . Chronic lower back pain   . Depression     Past Surgical History:  Procedure Laterality Date  . ABDOMINAL HYSTERECTOMY  03/1979  . BREAST BIOPSY Left 1986 and 1987  . CATARACT EXTRACTION, BILATERAL Bilateral 2014   Dr. Madelin Headings, in [place]     [place] History   No obstetric history on file.     Family History  Problem Relation Age of Onset  . Hypertension Mother   . Heart disease Mother   . Hypertension Father   . Heart disease Father   . Tremor Sister   . Dementia Brother   . Hypertension Daughter   . Colon cancer Neg Hx   . Esophageal cancer Neg Hx   . Rectal cancer Neg Hx   . Stomach cancer Neg Hx     Social History  Tobacco Use  . Smoking status: Never Smoker  . Smokeless tobacco: Never Used  Vaping Use  . Vaping Use: Never used  Substance Use Topics  . Alcohol use: Yes    Alcohol/week: 3.0 standard drinks    Types: 3 Standard drinks or equivalent per week    Comment: 3 nights out of the week  . Drug use: No    Home Medications Prior to Admission medications   Medication Sig Start Date End Date Taking? Authorizing Provider  acetaminophen (TYLENOL) 500 MG tablet Take 500 mg by mouth every 6 (six) hours as needed.    [provider]  apixaban (ELIQUIS) 5 MG TABS tablet  Take 1 tablet (5 mg total) by mouth 2 (two) times daily. 06/06/20   Arnoldo Lenis, MD  Ascorbic Acid (VITAMIN C) 500 MG CAPS Take 500 mg by mouth.     [provider]  CHOLECALCIFEROL PO Take 5,000 Units by mouth daily.    [provider]  cyanocobalamin 1000 MCG tablet Take 1,000 mcg by mouth daily.    [provider]  cyclobenzaprine (FLEXERIL) 10 MG tablet Take 1/2 to 1 tablet    2 to 3 x /day if needed for Muscle Spasms 04/25/19   [name], MD  diltiazem (CARDIZEM CD) 300 MG 24 hr capsule Take 1 capsule (300 mg total) by mouth daily. 03/18/20   Strader, Fransisco Hertz, PA-C  ferrous sulfate 325 (65 FE) MG tablet Take 1 tablet (325 mg total) by mouth every other day. 01/15/20   [name], NP  lansoprazole (PREVACID) 30 MG capsule TAKE 1 CAPSULE BY MOUTH  DAILY FOR HEARTBURN AND  INDIGESTION 06/06/20   [name], NP  Magnesium 250 MG TABS Take by mouth.    [provider]  metoprolol tartrate (LOPRESSOR) 25 MG tablet TAKE 2 TABLETS BY MOUTH ONCE DAILY IN THE MORNING AND 1 TABLET ONCE DAILY IN THE EVENING 05/09/20   Imogene Burn, PA-C  nitroGLYCERIN (NITROSTAT) 0.4 MG SL tablet Place 1 tablet (0.4 mg total) under the tongue every 5 (five) minutes as needed for chest pain. 02/05/20   Strader, Fransisco Hertz, PA-C  PARoxetine (PAXIL) 20 MG tablet TAKE 1 TABLET BY MOUTH  DAILY FOR MOOD 02/19/20   Garnet Sierras, NP  Probiotic Product (PROBIOTIC DAILY PO) Take 1 tablet by mouth daily.    [provider]  zinc gluconate 50 MG tablet Take 50 mg by mouth daily.    [provider]    Allergies    Cephalosporins  Review of Systems   Review of Systems  All other systems reviewed and are negative.   Physical Exam Updated Vital Signs BP 132/69   Pulse 64   Temp 98 F (36.7 C)   Resp 18   Ht 5\' 10"  (1.778 m)   Wt 106.6 kg   SpO2 97%   BMI 33.72 kg/m   Physical Exam Vitals and nursing note reviewed. Exam conducted with a  chaperone present.  Constitutional:      General: She is not in acute distress.    Appearance: Normal appearance. She is not ill-appearing.  HENT:     Head: Normocephalic and atraumatic.  Eyes:     General: No scleral icterus.    Conjunctiva/sclera: Conjunctivae normal.  Cardiovascular:     Rate and Rhythm: Normal rate. Rhythm irregular.     Pulses: Normal pulses.     Comments: Atrial fibrillation. Pulmonary:     Effort: Pulmonary effort is normal. No  respiratory distress.     Breath sounds: Normal breath sounds. No wheezing.  Abdominal:     General: Abdomen is flat. There is no distension.     Palpations: Abdomen is soft.     Tenderness: There is no abdominal tenderness.     Comments: Soft, nondistended.  No areas of abdominal tenderness.  Musculoskeletal:     Right lower leg: No edema.     Left lower leg: No edema.  Skin:    General: Skin is dry.  Neurological:     Mental Status: She is alert.     GCS: GCS eye subscore is 4. GCS verbal subscore is 5. GCS motor subscore is 6.  Psychiatric:        Mood and Affect: Mood normal.        Behavior: Behavior normal.        Thought Content: Thought content normal.     ED Results / Procedures / Treatments   Labs (all labs ordered are listed, but only abnormal results are displayed) Labs Reviewed  CBC - Abnormal; Notable for the following components:      Result Value   MCV 101.6 (*)    All other components within normal limits  BASIC METABOLIC PANEL  TROPONIN I (HIGH SENSITIVITY)  TROPONIN I (HIGH SENSITIVITY)    EKG None  Radiology DG Chest 2 View  Result Date: 08/07/2020 CLINICAL DATA:  Chest pain EXAM: CHEST - 2 VIEW COMPARISON:  04/15/2019 FINDINGS: Heart size and vascularity normal. Right lower lobe atelectasis has progressed. Left lung clear. Moderately large hiatal hernia IMPRESSION: Moderate hiatal hernia. Progressive right lower lobe density most likely atelectasis related to hiatal hernia. Electronically  Signed   By: Franchot Gallo M.D.   On: 08/07/2020 11:14    Procedures Procedures   Medications Ordered in ED Medications - No data to display  ED Course  I have reviewed the triage vital signs and the nursing notes.  Pertinent labs & imaging results that were available during my care of the patient were reviewed by me and considered in my medical decision making (see chart for details).    MDM Rules/Calculators/A&P HEAR Score: 4                        AYLLA HUFFINE was evaluated in Emergency Department on 08/07/2020 for the symptoms described in the history of present illness. She was evaluated in the context of the global COVID-19 pandemic, which necessitated consideration that the patient might be at risk for infection with the SARS-CoV-2 virus that causes COVID-19. Institutional protocols and algorithms that pertain to the evaluation of patients at risk for COVID-19 are in a state of rapid change based on information released by regulatory bodies including the CDC and federal and state organizations. These policies and algorithms were followed during the patient's care in the ED.  I personally reviewed patient's medical chart and all notes from triage and staff during today's encounter. I have also ordered and reviewed all labs and imaging that I felt to be medically necessary in the evaluation of this patient's complaints and with consideration of their physical exam. If needed, translation services were available and utilized.   Patient with nonspecific intermittent right-sided chest pain described as "sharp" that she thought was related to her hiatal hernia and history of GERD.  I reviewed patient's med record and she was just seen by her cardiologist, Dr. Harl Bowie, 07/31/2020.  He comments that she had a  nuclear stress test performed 11/2019 that was without any evidence of ischemia.  She has pulmonary hypertension, but PFTs are benign.  She has been rate controlled and without any  bleeding events on Eliquis.  Low well score for DVT and PE.  On Eliquis.  Vitals reassuring.  Her right-sided sharp chest discomfort is intermittent and is not associated with shortness of breath.  Doubt PE as cause.  Peripheral pulses are intact and symmetric.  No radiation to the back.  Doubt dissection or aneurysm.  No quadrant abdominal tenderness.  Doubt referred pain from the abdomen.  Plain films are reviewed and demonstrate a moderately large hiatal hernia and progressive right lower lobe density which is likely atelectasis in context of known hernia.  EKG with rate controlled atrial fibrillation.    CBC without anemia with assistance concerning for infection.  BMP without electrolyte derangement or renal primary.  Initial troponin of 3, will trend.  If troponin is delta of 5-19 or between 18 and 99, heart pathway recommends observation admission given her heart score of 4.  Otherwise, she can follow-up with her cardiologist Dr. Harl Bowie as well as her primary care provider.  Encourage her to continue taking her GERD medications and she can try Maalox as an abortive therapy if she develops recurrence of her right-sided chest discomfort.  Discussed case with Dr. Almyra Free who agrees with assessment and plan.  ER return precautions discussed.  Patient voices understanding and is agreeable to the plan.  Final Clinical Impression(s) / ED Diagnoses Final diagnoses:  Nonspecific chest pain    Rx / DC Orders ED Discharge Orders    None       Reita Chard 08/07/20 1703    Luna Fuse, MD 08/10/20 2017

## 2020-09-03 ENCOUNTER — Other Ambulatory Visit: Payer: Self-pay

## 2020-09-03 MED ORDER — METOPROLOL TARTRATE 25 MG PO TABS: ORAL_TABLET | ORAL | 3 refills | Status: DC

## 2020-09-03 NOTE — Telephone Encounter (Signed)
This is a  pt.  °

## 2020-09-25 DIAGNOSIS — G4733 Obstructive sleep apnea (adult) (pediatric): Secondary | ICD-10-CM | POA: Diagnosis not present

## 2020-09-30 ENCOUNTER — Other Ambulatory Visit: Payer: Self-pay | Admitting: *Deleted

## 2020-09-30 NOTE — Patient Outreach (Signed)
[facility]) Care Management  09/30/2020  Darlene [name] 1940/11/04 [id number]  Unsuccessful outreach attempt made to patient. Patient's daughter [name] answered the phone and stated that the patient was taking a nap and would not be able to speak today. [name] did state that the patient was doing well and requested that this nurse call back at a later date.   Plan: RN Health Coach will call patient within the month of August.  [name] RN, BSN [place] [phone] [email or web address]

## 2020-10-10 ENCOUNTER — Encounter: Payer: Medicare Other | Admitting: Internal Medicine

## 2020-10-25 ENCOUNTER — Telehealth: Payer: Self-pay | Admitting: Pharmacy Technician

## 2020-10-25 DIAGNOSIS — Z596 Low income: Secondary | ICD-10-CM

## 2020-10-25 NOTE — Progress Notes (Signed)
[name]                                            [place] Team    10/25/2020  ANIAS [name] 1940-10-17 [id number]                                      Medication Assistance Referral  Referral From: [facility] [name] B.-self referral  Medication/Company: Eilquis / BMS Patient application portion:  Mailed Provider application portion: Faxed  to Dr. Carlyle Dolly Provider address/fax verified via: Office website     Darlene [name], [place]  [phone]

## 2020-10-28 NOTE — Progress Notes (Signed)
Annual Screening/Preventative Visit & Comprehensive Evaluation &  Examination  Future Appointments  Date Time Provider Darlene [name]  10/29/2020 11:00 AM [name], MD [facility] None  04/03/2021 - Wellness 10:30 AM Darlene Comber, NP [facility] None  05/21/2021  3:00 PM Darlene Presto, NP [facility] None  10/29/2021  - CPE  11:00 AM [name], MD [facility] None        This very nice 80 y.o. WWF presents for a Screening /Preventative Visit & comprehensive evaluation and management of multiple medical co-morbidities.  Patient has been followed for labile HTN, ASHD/pAfib, Pulm HTN, OSA, HLD, Prediabetes  and Vitamin D Deficiency. Patient has hx/o GERD controlled on meds and also has hx/o Major Depression in remission on meds. Patient is followed by Dr Brett Fairy for her OSA on auto CPAP.         Patient's BP has been controlled at home and patient denies any cardiac symptoms as chest pain, palpitations, shortness of breath, dizziness or ankle swelling. Today's BP is at goal - 112/72 . Patient has hx of pAfib & is on Eliquis followed by Dr Harl Bowie.       Patient's hyperlipidemia is controlled with diet and medications. Patient denies myalgias or other medication SE's. Last lipids were not at goal:  Lab Results  Component Value Date   CHOL 197 04/03/2020   HDL 67 04/03/2020   LDLCALC 111 (H) 04/03/2020   TRIG 88 04/03/2020   CHOLHDL 2.9 04/03/2020         Patient has moderately overweight  (BMI 31+) and is monitored expectantly for glucose intolerance.   Patient denies reactive hypoglycemic symptoms, visual blurring, diabetic polys or paresthesias. Last A1c was near goal:  Lab Results  Component Value Date   HGBA1C 5.9 (H) 04/03/2020         Finally, patient has history of Vitamin D Deficiency ("28" /2019) and last Vitamin D was at goal:  Lab Results  Component Value Date   VD25OH 68 04/03/2020     Current Outpatient Medications on File Prior to Visit  Medication  Sig   acetaminophen 500 MG tablet Take  every 6  hours as needed.   ELIQUIS 5 MG TABS tablet Take 1 tablet 2  times daily.   VITAMIN C 500 MG CAPS Take daily   CHOLECALCIFEROL 5,000 Units  Take daily.   cyanocobalamin 1000 MCG tablet Take daily.   cyclobenzaprine 10 MG tablet Take 1/2 to 1 tab 2 to 3 x /day if needed    diltiazem CD 300 MG 24 hr capsule Take 1 capsule  daily.   ferrous sulfate 325 (65 FE) MG tablet Take 1 tablet every other day.   lansoprazole (PREVACID) 30 MG capsule TAKE 1 CAPSULE DAILY    Magnesium 250 MG TABS Take by mouth.   metoprolol tartrate (LOPRESSOR) 25 MG tablet TAKE 2 TABS  MORNING AND 1 TAB EVENING   NITROSTAT 0.4 MG SL tablet as needed for chest pain.   PARoxetine  20 MG tablet TAKE 1 TABLET DAILY FOR MOOD   PROBIOTIC DAILY PO) Take 1 tablet  daily.   METAMUCIL 58.6 % packet Take 1 packet daily.   zinc 50 MG tablet Take  daily.    Allergies  Allergen Reactions   Cephalosporins Rash    Past Medical History:  Diagnosis Date   Anxiety    Chronic lower back pain    Depression    DJD (degenerative joint disease)    knees, neck   GERD (gastroesophageal  reflux disease)    has had dilitation in the past   Hypertension      Health Maintenance  Topic Date Due   Zoster Vaccines- Shingrix (1 of 2) Never done   COVID-19 Vaccine (4 - Booster for Moderna series) 05/27/2020   COLONOSCOPY  06/07/2020   INFLUENZA VACCINE  10/14/2020   DEXA SCAN  04/03/2021 (Originally 01/13/2005)   TETANUS/TDAP  04/03/2021 (Originally 01/14/1960)   PNA vac Low Risk Adult  Completed   HPV VACCINES  Aged Out    Immunization History  Administered Date(s) Administered   Fluad Quad(high Dose) 12/04/2019   Influenza, High Dose Seasonal PF 01/29/2018   Influenza 12/09/2016, 11/28/2018   Moderna Sars-Covid-2 Vacc 03/28/2019, 04/28/2019, 02/27/2020   Pneumococcal -13 02/12/2016, 09/30/2017   Pneumococcal -23 03/16/2006, 04/02/2008   Zoster, Live 07/31/2013     Last  Colon -  06/07/2017 - Dr Fuller Plan - adenomatous Polyps - Recc 3 yr f/u due Apr 2022  Last EGD -   06/07/2017 - Dr Fuller Plan - dilated esophageal stricture  Last MGM - 02/12/2020   Past Surgical History:  Procedure Laterality Date   ABDOMINAL HYSTERECTOMY  03/1979   BREAST BIOPSY Left 1986 and 1987   CATARACT EXTRACTION, BILATERAL Bilateral 2014   Dr. Madelin Headings, in [place]     Family History  Problem Relation Age of Onset   Hypertension Mother    Heart disease Mother    Hypertension Father    Heart disease Father    Tremor Sister    Dementia Brother    Hypertension Daughter    Colon cancer Neg Hx    Esophageal cancer Neg Hx    Rectal cancer Neg Hx    Stomach cancer Neg Hx      Social History   Tobacco Use   Smoking status: Never   Smokeless tobacco: Never  Vaping Use   Vaping Use: Never used  Substance Use Topics   Alcohol use: Yes    Alcohol/week: 3.0 standard drinks    Types: 3 Standard drinks or equivalent per week    Comment: 3 nights out of the week   Drug use: No      ROS Constitutional: Denies fever, chills, weight loss/gain, headaches, insomnia,  night sweats, and change in appetite. Does c/o fatigue. Eyes: Denies redness, blurred vision, diplopia, discharge, itchy, watery eyes.  ENT: Denies discharge, congestion, post nasal drip, epistaxis, sore throat, earache, hearing loss, dental pain, Tinnitus, Vertigo, Sinus pain, snoring.  Cardio: Denies chest pain, palpitations, irregular heartbeat, syncope, dyspnea, diaphoresis, orthopnea, PND, claudication, edema Respiratory: denies cough, dyspnea, DOE, pleurisy, hoarseness, laryngitis, wheezing.  Gastrointestinal: Denies dysphagia, heartburn, reflux, water brash, pain, cramps, nausea, vomiting, bloating, diarrhea, constipation, hematemesis, melena, hematochezia, jaundice, hemorrhoids Genitourinary: Denies dysuria, frequency, urgency, nocturia, hesitancy, discharge, hematuria, flank pain Breast: Breast lumps, nipple  discharge, bleeding.  Musculoskeletal: Denies arthralgia, myalgia, stiffness, Jt. Swelling, pain, limp, and strain/sprain. Denies falls. Skin: Denies puritis, rash, hives, warts, acne, eczema, changing in skin lesion Neuro: No weakness, tremor, incoordination, spasms, paresthesia, pain Psychiatric: Denies confusion, memory loss, sensory loss. Denies Depression. Endocrine: Denies change in weight, skin, hair change, nocturia, and paresthesia, diabetic polys, visual blurring, hyper / hypo glycemic episodes.  Heme/Lymph: No excessive bleeding, bruising, enlarged lymph nodes.  Physical Exam  BP 112/72   Pulse 82   Temp (!) 97 F (36.1 C)   Resp 16   Ht '5\' 10"'$  (1.778 m)   Wt 235 lb 12.8 oz (107 kg)   SpO2 97%  BMI 33.83 kg/m   General Appearance: Well nourished, well groomed and in no apparent distress.  Eyes: PERRLA, EOMs, conjunctiva no swelling or erythema, normal fundi and vessels. Sinuses: No frontal/maxillary tenderness ENT/Mouth: EACs patent / TMs  nl. Nares clear without erythema, swelling, mucoid exudates. Oral hygiene is good. No erythema, swelling, or exudate. Tongue normal, non-obstructing. Tonsils not swollen or erythematous. Hearing normal.  Neck: Supple, thyroid not palpable. No bruits, nodes or JVD. Respiratory: Respiratory effort normal.  BS equal and clear bilateral without rales, rhonci, wheezing or stridor. Cardio: Heart sounds are normal with regular rate and rhythm and no murmurs, rubs or gallops. Peripheral pulses are normal and equal bilaterally without edema. No aortic or femoral bruits. Chest: symmetric with normal excursions and percussion. Breasts: Symmetric, without lumps, nipple discharge, retractions, or fibrocystic changes.  Abdomen: Flat, soft with bowel sounds active. Nontender, no guarding, rebound, hernias, masses, or organomegaly.  Lymphatics: Non tender without lymphadenopathy.  Genitourinary:  Musculoskeletal: Full ROM all peripheral extremities,  joint stability, 5/5 strength, and normal gait. Skin: Warm and dry without rashes, lesions, cyanosis, clubbing or  ecchymosis.  Neuro: Cranial nerves intact, reflexes equal bilaterally. Normal muscle tone, no cerebellar symptoms. Sensation intact.  Pysch: Alert and oriented X 3, normal affect, Insight and Judgment appropriate.    Assessment and Plan  1. Annual Preventative Screening Examination   2. Labile hypertension  - EKG 12-Lead - Urinalysis, Routine w reflex microscopic - Microalbumin / creatinine urine ratio - CBC with Differential/Platelet - COMPLETE METABOLIC PANEL WITH GFR - Magnesium - TSH  3. Hyperlipidemia, mixed  - EKG 12-Lead - Lipid panel - TSH  4. Abnormal glucose  - EKG 12-Lead - Hemoglobin A1c - Insulin, random  5. Vitamin D deficiency  - VITAMIN D 25 Hydroxy (Vit-D Deficiency, Fractures)  6. Prediabetes  - Hemoglobin A1c - Insulin, random  7. Gastroesophageal reflux disease  - CBC with Differential/Platelet  8. Central sleep apnea associated with atrial fibrillation (HCC)   9. Paroxysmal atrial fibrillation (HCC)  - EKG 12-Lead  10. Screening for colorectal cancer  - POC Hemoccult Bld/Stl (3-Cd Home Screen); Future  11. Morbid obesity ([place]) - BMI 30+ with OSA  - TSH  12. Screening for heart disease  - EKG 12-Lead  13. FHx: heart disease  - EKG 12-Lead  14. Medication management  - Urinalysis, Routine w reflex microscopic - Microalbumin / creatinine urine ratio - CBC with Differential/Platelet - COMPLETE METABOLIC PANEL WITH GFR - Magnesium - Lipid panel - TSH - Hemoglobin A1c - Insulin, random - VITAMIN D 25 Hydroxy        Patient was counseled in prudent diet to achieve/maintain BMI less than 25 for weight control, BP monitoring, regular exercise and medications. Discussed med's effects and SE's. Screening labs and tests as requested with regular follow-up as recommended. Over 40 minutes of exam, counseling, chart  review and high complex critical decision making was performed.   Kirtland Bouchard, MD

## 2020-10-28 NOTE — Patient Instructions (Signed)
Due to recent changes in healthcare laws, you may see the results of your imaging and laboratory studies on MyChart before your provider has had a chance to review them.  We understand that in some cases there may be results that are confusing or concerning to you. Not all laboratory results come back in the same time frame and the provider may be waiting for multiple results in order to interpret others.  Please give [place] 48 hours in order for your provider to thoroughly review all the results before contacting the office for clarification of your results.   +++++++++++++++++++++++++  Vit D  & Vit C 1,000 mg   are recommended to help protect  against the Covid-19 and other Corona viruses.    Also it's recommended  to take  Zinc 50 mg  to help  protect against the Covid-19   and best place to get  is also on [email or web address]  and don't pay more than 6-8 cents /pill !  ================================ Coronavirus (COVID-19) Are you at risk?  Are you at risk for the Coronavirus (COVID-19)?  To be considered HIGH RISK for Coronavirus (COVID-19), you have to meet the following criteria:  Traveled to [place], [place], [place], [place] or [place]; or in the [place] to [place], [place], [place]  or [place]; and have fever, cough, and shortness of breath within the last 2 weeks of travel OR Been in close contact with a person diagnosed with COVID-19 within the last 2 weeks and have  fever, cough,and shortness of breath  IF YOU DO NOT MEET THESE CRITERIA, YOU ARE CONSIDERED LOW RISK FOR COVID-19.  What to do if you are HIGH RISK for COVID-19?  If you are having a medical emergency, call 911. Seek medical care right away. Before you go to a doctor's office, urgent care or emergency department,  call ahead and tell them about your recent travel, contact with someone diagnosed with COVID-19   and your symptoms.  You should receive instructions from your physician's office regarding next  steps of care.  When you arrive at healthcare provider, tell the healthcare staff immediately you have returned from  visiting [place], [place], [place], [place] or [place]; or traveled in the [place] to [place], [place],  [place] or [place] in the last two weeks or you have been in close contact with a person diagnosed with  COVID-19 in the last 2 weeks.   Tell the health care staff about your symptoms: fever, cough and shortness of breath. After you have been seen by a medical provider, you will be either: Tested for (COVID-19) and discharged home on quarantine except to seek medical care if  symptoms worsen, and asked to  Stay home and avoid contact with others until you get your results (4-5 days)  Avoid travel on public transportation if possible (such as bus, train, or airplane) or Sent to the Emergency Department by EMS for evaluation, COVID-19 testing  and  possible admission depending on your condition and test results.  What to do if you are LOW RISK for COVID-19?  Reduce your risk of any infection by using the same precautions used for avoiding the common cold or flu:  Wash your hands often with soap and warm water for at least 20 seconds.  If soap and water are not readily available,  use an alcohol-based hand sanitizer with at least 60% alcohol.  If coughing or sneezing, cover your mouth and nose by coughing  or sneezing into the elbow areas of your shirt or coat,  into a tissue or into your sleeve (not your hands). Avoid shaking hands with others and consider head nods or verbal greetings only. Avoid touching your eyes, nose, or mouth with unwashed hands.  Avoid close contact with people who are sick. Avoid places or events with large numbers of people in one location, like concerts or sporting events. Carefully consider travel plans you have or are making. If you are planning any travel outside or inside the [place], visit the CDC's Travelers' Health webpage for the  latest health notices. If you have some symptoms but not all symptoms, continue to monitor at home and seek medical attention  if your symptoms worsen. If you are having a medical emergency, call 911.   >>>>>>>>>>>>>>>>>>>>>>>>>>>>>>>>> We Do NOT Approve of  [facility], [employer]  Our Patients  To Do Home Visits & We Do NOT Approve of [employer] > > > > > > > > > > > > > > > > > > > > > > > > > > > > > > > > > > > > > > >  Preventive Care for Adults  A healthy lifestyle and preventive care can promote health and wellness. Preventive health guidelines for women include the following key practices. A routine yearly physical is a good way to check with your health care provider about your health and preventive screening. It is a chance to share any concerns and updates on your health and to receive a thorough exam. Visit your dentist for a routine exam and preventive care every 6 months. Brush your teeth twice a day and floss once a day. Good oral hygiene prevents tooth decay and gum disease. The frequency of eye exams is based on your age, health, family medical history, use of contact lenses, and other factors. Follow your health care provider's recommendations for frequency of eye exams. Eat a healthy diet. Foods like vegetables, fruits, whole grains, low-fat dairy products, and lean protein foods contain the nutrients you need without too many calories. Decrease your intake of foods high in solid fats, added sugars, and salt. Eat the right amount of calories for you. Get information about a proper diet from your health care provider, if necessary. Regular physical exercise is one of the most important things you can do for your health. Most adults should get at least 150 minutes of moderate-intensity exercise (any activity that increases your heart rate and causes you to sweat) each week. In addition, most adults need muscle-strengthening exercises on 2 or more days  a week. Maintain a healthy weight. The body mass index (BMI) is a screening tool to identify possible weight problems. It provides an estimate of body fat based on height and weight. Your health care provider can find your BMI and can help you achieve or maintain a healthy weight. For adults 20 years and older: A BMI below 18.5 is considered underweight. A BMI of 18.5 to 24.9 is normal. A BMI of 25 to 29.9 is considered overweight. A BMI of 30 and above is considered obese. Maintain normal blood lipids and cholesterol levels by exercising and minimizing your intake of saturated fat. Eat a balanced diet with plenty of fruit and vegetables. If your lipid or cholesterol levels are high, you are over 50, or you are at high risk for heart disease, you may need your cholesterol levels checked more frequently. Ongoing high lipid and cholesterol levels should  be treated with medicines if diet and exercise are not working. If you smoke, find out from your health care provider how to quit. If you do not use tobacco, do not start. Lung cancer screening is recommended for adults aged 35-80 years who are at high risk for developing lung cancer because of a history of smoking. A yearly low-dose CT scan of the lungs is recommended for people who have at least a 30-pack-year history of smoking and are a current smoker or have quit within the past 15 years. A pack year of smoking is smoking an average of 1 pack of cigarettes a day for 1 year (for example: 1 pack a day for 30 years or 2 packs a day for 15 years). Yearly screening should continue until the smoker has stopped smoking for at least 15 years. Yearly screening should be stopped for people who develop a health problem that would prevent them from having lung cancer treatment. Avoid use of street drugs. Do not share needles with anyone. Ask for help if you need support or instructions about stopping the use of drugs. High blood pressure causes heart disease and  increases the risk of stroke.  Ongoing high blood pressure should be treated with medicines if weight loss and exercise do not work. If you are 56-37 years old, ask your health care provider if you should take aspirin to prevent strokes. Diabetes screening involves taking a blood sample to check your fasting blood sugar level. This should be done once every 3 years, after age 50, if you are within normal weight and without risk factors for diabetes. Testing should be considered at a younger age or be carried out more frequently if you are overweight and have at least 1 risk factor for diabetes. Breast cancer screening is essential preventive care for women. You should practice "breast self-awareness." This means understanding the normal appearance and feel of your breasts and may include breast self-examination. Any changes detected, no matter how small, should be reported to a health care provider. Women in their 83s and 30s should have a clinical breast exam (CBE) by a health care provider as part of a regular health exam every 1 to 3 years. After age 65, women should have a CBE every year. Starting at age 54, women should consider having a mammogram (breast X-ray test) every year. Women who have a family history of breast cancer should talk to their health care provider about genetic screening. Women at a high risk of breast cancer should talk to their health care providers about having an MRI and a mammogram every year. Breast cancer gene (BRCA)-related cancer risk assessment is recommended for women who have family members with BRCA-related cancers. BRCA-related cancers include breast, ovarian, tubal, and peritoneal cancers. Having family members with these cancers may be associated with an increased risk for harmful changes (mutations) in the breast cancer genes BRCA1 and BRCA2. Results of the assessment will determine the need for genetic counseling and BRCA1 and BRCA2 testing. Routine pelvic exams to  screen for cancer are no longer recommended for nonpregnant women who are considered low risk for cancer of the pelvic organs (ovaries, uterus, and vagina) and who do not have symptoms. Ask your health care provider if a screening pelvic exam is right for you. If you have had past treatment for cervical cancer or a condition that could lead to cancer, you need Pap tests and screening for cancer for at least 20 years after your treatment. If Pap  tests have been discontinued, your risk factors (such as having a new sexual partner) need to be reassessed to determine if screening should be resumed. Some women have medical problems that increase the chance of getting cervical cancer. In these cases, your health care provider may recommend more frequent screening and Pap tests.  Colorectal cancer can be detected and often prevented. Most routine colorectal cancer screening begins at the age of 22 years and continues through age 47 years. However, your health care provider may recommend screening at an earlier age if you have risk factors for colon cancer. On a yearly basis, your health care provider may provide home test kits to check for hidden blood in the stool. Use of a small camera at the end of a tube, to directly examine the colon (sigmoidoscopy or colonoscopy), can detect the earliest forms of colorectal cancer. Talk to your health care provider about this at age 55, when routine screening begins.  Direct exam of the colon should be repeated every 5-10 years through age 22 years, unless early forms of pre-cancerous polyps or small growths are found. Osteoporosis is a disease in which the bones lose minerals and strength with aging. This can result in serious bone fractures or breaks. The risk of osteoporosis can be identified using a bone density scan. Women ages 19 years and over and women at risk for fractures or osteoporosis should discuss screening with their health care providers. Ask your health care  provider whether you should take a calcium supplement or vitamin D to reduce the rate of osteoporosis. Menopause can be associated with physical symptoms and risks. Hormone replacement therapy is available to decrease symptoms and risks. You should talk to your health care provider about whether hormone replacement therapy is right for you. Use sunscreen. Apply sunscreen liberally and repeatedly throughout the day. You should seek shade when your shadow is shorter than you. Protect yourself by wearing long sleeves, pants, a wide-brimmed hat, and sunglasses year round, whenever you are outdoors. Once a month, do a whole body skin exam, using a mirror to look at the skin on your back. Tell your health care provider of new moles, moles that have irregular borders, moles that are larger than a pencil eraser, or moles that have changed in shape or color. Stay current with required vaccines (immunizations). Influenza vaccine. All adults should be immunized every year. Tetanus, diphtheria, and acellular pertussis (Td, Tdap) vaccine. Pregnant women should receive 1 dose of Tdap vaccine during each pregnancy. The dose should be obtained regardless of the length of time since the last dose. Immunization is preferred during the 27th-36th week of gestation. An adult who has not previously received Tdap or who does not know her vaccine status should receive 1 dose of Tdap. This initial dose should be followed by tetanus and diphtheria toxoids (Td) booster doses every 10 years. Adults with an unknown or incomplete history of completing a 3-dose immunization series with Td-containing vaccines should begin or complete a primary immunization series including a Tdap dose. Adults should receive a Td booster every 10 years.  Zoster vaccine. One dose is recommended for adults aged 65 years or older unless certain conditions are present.  Pneumococcal 13-valent conjugate (PCV13) vaccine. When indicated, a person who is  uncertain of her immunization history and has no record of immunization should receive the PCV13 vaccine. An adult aged 60 years or older who has certain medical conditions and has not been previously immunized should receive 1 dose of  PCV13 vaccine. This PCV13 should be followed with a dose of pneumococcal polysaccharide (PPSV23) vaccine. The PPSV23 vaccine dose should be obtained at least 1 or more year(s) after the dose of PCV13 vaccine. An adult aged 36 years or older who has certain medical conditions and previously received 1 or more doses of PPSV23 vaccine should receive 1 dose of PCV13. The PCV13 vaccine dose should be obtained 1 or more years after the last PPSV23 vaccine dose.  Pneumococcal polysaccharide (PPSV23) vaccine. When PCV13 is also indicated, PCV13 should be obtained first. All adults aged 16 years and older should be immunized. An adult younger than age 12 years who has certain medical conditions should be immunized. Any person who resides in a nursing home or long-term care facility should be immunized. An adult smoker should be immunized. People with an immunocompromised condition and certain other conditions should receive both PCV13 and PPSV23 vaccines. People with human immunodeficiency virus (HIV) infection should be immunized as soon as possible after diagnosis. Immunization during chemotherapy or radiation therapy should be avoided. Routine use of PPSV23 vaccine is not recommended for American Indians, [place] Natives, or people younger than 65 years unless there are medical conditions that require PPSV23 vaccine. When indicated, people who have unknown immunization and have no record of immunization should receive PPSV23 vaccine. One-time revaccination 5 years after the first dose of PPSV23 is recommended for people aged 19-64 years who have chronic kidney failure, nephrotic syndrome, asplenia, or immunocompromised conditions. People who received 1-2 doses of PPSV23 before age 60 years  should receive another dose of PPSV23 vaccine at age 53 years or later if at least 5 years have passed since the previous dose. Doses of PPSV23 are not needed for people immunized with PPSV23 at or after age 37 years.  Preventive Services / Frequency  Ages 1 years and over Blood pressure check. Lipid and cholesterol check. Lung cancer screening. / Every year if you are aged 32-80 years and have a 30-pack-year history of smoking and currently smoke or have quit within the past 15 years. Yearly screening is stopped once you have quit smoking for at least 15 years or develop a health problem that would prevent you from having lung cancer treatment. Clinical breast exam.** / Every year after age 45 years.  BRCA-related cancer risk assessment.** / For women who have family members with a BRCA-related cancer (breast, ovarian, tubal, or peritoneal cancers). Mammogram.** / Every year beginning at age 5 years and continuing for as long as you are in good health. Consult with your health care provider. Pap test.** / Every 3 years starting at age 51 years through age 41 or 58 years with 3 consecutive normal Pap tests. Testing can be stopped between 65 and 70 years with 3 consecutive normal Pap tests and no abnormal Pap or HPV tests in the past 10 years. Fecal occult blood test (FOBT) of stool. / Every year beginning at age 51 years and continuing until age 76 years. You may not need to do this test if you get a colonoscopy every 10 years. Flexible sigmoidoscopy or colonoscopy.** / Every 5 years for a flexible sigmoidoscopy or every 10 years for a colonoscopy beginning at age 61 years and continuing until age 81 years. Hepatitis C blood test.** / For all people born from [birth year] through 1966 and any individual with known risks for hepatitis C. Osteoporosis screening.** / A one-time screening for women ages 12 years and over and women at risk for fractures or  osteoporosis. Skin self-exam. / Monthly. Influenza  vaccine. / Every year. Tetanus, diphtheria, and acellular pertussis (Tdap/Td) vaccine.** / 1 dose of Td every 10 years. Zoster vaccine.** / 1 dose for adults aged 64 years or older. Pneumococcal 13-valent conjugate (PCV13) vaccine.** / Consult your health care provider. Pneumococcal polysaccharide (PPSV23) vaccine.** / 1 dose for all adults aged 25 years and older. Screening for abdominal aortic aneurysm (AAA)  by ultrasound is recommended for people who have history of high blood pressure or who are current or former smokers. ++++++++++++++++++++ Recommend Adult Low Dose Aspirin or  coated  Aspirin 81 mg daily  To reduce risk of Colon Cancer 40 %,  Skin Cancer 26 % ,  Melanoma 46%  and  Pancreatic cancer 60% ++++++++++++++++++++ Vitamin D goal  is between 70-100.  Please make sure that you are taking your Vitamin D as directed.  It is very important as a natural anti-inflammatory  helping hair, skin, and nails, as well as reducing stroke and heart attack risk.  It helps your bones and helps with mood. It also decreases numerous cancer risks so please take it as directed.  Low Vit D is associated with a 200-300% higher risk for CANCER  and 200-300% higher risk for HEART   ATTACK  &  STROKE.   .....................................Marland Kitchen It is also associated with higher death rate at younger ages,  autoimmune diseases like Rheumatoid arthritis, Lupus, Multiple Sclerosis.    Also many other serious conditions, like depression, Alzheimer's Dementia, infertility, muscle aches, fatigue, fibromyalgia - just to name a few. ++++++++++++++++++ Recommend the book "The END of DIETING" by Dr Excell Seltzer  & the book "The END of DIABETES " by Dr Excell Seltzer At [email or web address] - get book & Audio CD's    Being diabetic has a  300% increased risk for heart attack, stroke, cancer, and alzheimer- type vascular dementia. It is very important that you work harder with diet by avoiding all foods that are white.  Avoid white rice (brown & wild rice is OK), white potatoes (sweetpotatoes in moderation is OK), White bread or wheat bread or anything made out of white flour like bagels, donuts, rolls, buns, biscuits, cakes, pastries, cookies, pizza crust, and pasta (made from white flour & egg whites) - vegetarian pasta or spinach or wheat pasta is OK. Multigrain breads like Arnold's or [employer], or multigrain sandwich thins or flatbreads.  Diet, exercise and weight loss can reverse and cure diabetes in the early stages.  Diet, exercise and weight loss is very important in the control and prevention of complications of diabetes which affects every system in your body, ie. Brain - dementia/stroke, eyes - glaucoma/blindness, heart - heart attack/heart failure, kidneys - dialysis, stomach - gastric paralysis, intestines - malabsorption, nerves - severe painful neuritis, circulation - gangrene & loss of a leg(s), and finally cancer and Alzheimers.    I recommend avoid fried & greasy foods,  sweets/candy, white rice (brown or wild rice or Quinoa is OK), white potatoes (sweet potatoes are OK) - anything made from white flour - bagels, doughnuts, rolls, buns, biscuits,white and wheat breads, pizza crust and traditional pasta made of white flour & egg white(vegetarian pasta or spinach or wheat pasta is OK).  Multi-grain bread is OK - like multi-grain flat bread or sandwich thins. Avoid alcohol in excess. Exercise is also important.    Eat all the vegetables you want - avoid meat, especially red meat and dairy - especially cheese.  Cheese is the most  concentrated form of trans-fats which is the worst thing to clog up our arteries. Veggie cheese is OK which can be found in the fresh produce section at [employer] or [employer] or [employer]  +++++++++++++++++++ DASH Eating Plan  DASH stands for "Dietary Approaches to Stop Hypertension."   The DASH eating plan is a healthy eating plan that has been shown to reduce high  blood pressure (hypertension). Additional health benefits may include reducing the risk of type 2 diabetes mellitus, heart disease, and stroke. The DASH eating plan may also help with weight loss. WHAT DO I NEED TO KNOW ABOUT THE DASH EATING PLAN? For the DASH eating plan, you will follow these general guidelines: Choose foods with a percent daily value for sodium of less than 5% (as listed on the food label). Use salt-free seasonings or herbs instead of table salt or sea salt. Check with your health care provider or pharmacist before using salt substitutes. Eat lower-sodium products, often labeled as "lower sodium" or "no salt added." Eat fresh foods. Eat more vegetables, fruits, and low-fat dairy products. Choose whole grains. Look for the word "whole" as the first word in the ingredient list. Choose fish  Limit sweets, desserts, sugars, and sugary drinks. Choose heart-healthy fats. Eat veggie cheese  Eat more home-cooked food and less restaurant, buffet, and fast food. Limit fried foods. Cook foods using methods other than frying. Limit canned vegetables. If you do use them, rinse them well to decrease the sodium. When eating at a restaurant, ask that your food be prepared with less salt, or no salt if possible.                      WHAT FOODS CAN I EAT? Read Dr Fara Olden Fuhrman's books on The End of Dieting & The End of Diabetes  Grains Whole grain or whole wheat bread. Brown rice. Whole grain or whole wheat pasta. Quinoa, bulgur, and whole grain cereals. Low-sodium cereals. Corn or whole wheat flour tortillas. Whole grain cornbread. Whole grain crackers. Low-sodium crackers.  Vegetables Fresh or frozen vegetables (raw, steamed, roasted, or grilled). Low-sodium or reduced-sodium tomato and vegetable juices. Low-sodium or reduced-sodium tomato sauce and paste. Low-sodium or reduced-sodium canned vegetables.   Fruits All fresh, canned (in natural juice), or frozen fruits.  Protein  Products  All fish and seafood.  Dried beans, peas, or lentils. Unsalted nuts and seeds. Unsalted canned beans.  Dairy Low-fat dairy products, such as skim or 1% milk, 2% or reduced-fat cheeses, low-fat ricotta or cottage cheese, or plain low-fat yogurt. Low-sodium or reduced-sodium cheeses.  Fats and Oils Tub margarines without trans fats. Light or reduced-fat mayonnaise and salad dressings (reduced sodium). Avocado. Safflower, olive, or canola oils. Natural peanut or almond butter.  Other Unsalted popcorn and pretzels. The items listed above may not be a complete list of recommended foods or beverages. Contact your dietitian for more options.  +++++++++++++++  WHAT FOODS ARE NOT RECOMMENDED? Grains/ White flour or wheat flour White bread. White pasta. White rice. Refined cornbread. Bagels and croissants. Crackers that contain trans fat.  Vegetables  Creamed or fried vegetables. Vegetables in a . Regular canned vegetables. Regular canned tomato sauce and paste. Regular tomato and vegetable juices.  Fruits Dried fruits. Canned fruit in light or heavy syrup. Fruit juice.  Meat and Other Protein Products Meat in general - RED meat & White meat.  Fatty cuts of meat. Ribs, chicken wings, all processed meats as bacon, sausage, bologna, salami, fatback,  hot dogs, bratwurst and packaged luncheon meats.  Dairy Whole or 2% milk, cream, half-and-half, and cream cheese. Whole-fat or sweetened yogurt. Full-fat cheeses or blue cheese. Non-dairy creamers and whipped toppings. Processed cheese, cheese spreads, or cheese curds.  Condiments Onion and garlic salt, seasoned salt, table salt, and sea salt. Canned and packaged gravies. Worcestershire sauce. Tartar sauce. Barbecue sauce. Teriyaki sauce. Soy sauce, including reduced sodium. Steak sauce. Fish sauce. Oyster sauce. Cocktail sauce. Horseradish. Ketchup and mustard. Meat flavorings and tenderizers. Bouillon cubes. Hot sauce. Tabasco sauce.  Marinades. Taco seasonings. Relishes.  Fats and Oils Butter, stick margarine, lard, shortening and bacon fat. Coconut, palm kernel, or palm oils. Regular salad dressings.  Pickles and olives. Salted popcorn and pretzels.  The items listed above may not be a complete list of foods and beverages to avoid.

## 2020-10-29 ENCOUNTER — Ambulatory Visit (INDEPENDENT_AMBULATORY_CARE_PROVIDER_SITE_OTHER): Payer: Medicare Other | Admitting: Internal Medicine

## 2020-10-29 ENCOUNTER — Other Ambulatory Visit: Payer: Self-pay

## 2020-10-29 ENCOUNTER — Encounter: Payer: Self-pay | Admitting: Internal Medicine

## 2020-10-29 VITALS — BP 112/72 | HR 82 | Temp 97.0°F | Resp 16 | Ht 70.0 in | Wt 235.8 lb

## 2020-10-29 DIAGNOSIS — I10 Essential (primary) hypertension: Secondary | ICD-10-CM

## 2020-10-29 DIAGNOSIS — Z0001 Encounter for general adult medical examination with abnormal findings: Secondary | ICD-10-CM

## 2020-10-29 DIAGNOSIS — R0989 Other specified symptoms and signs involving the circulatory and respiratory systems: Secondary | ICD-10-CM

## 2020-10-29 DIAGNOSIS — I4891 Unspecified atrial fibrillation: Secondary | ICD-10-CM

## 2020-10-29 DIAGNOSIS — Z8249 Family history of ischemic heart disease and other diseases of the circulatory system: Secondary | ICD-10-CM | POA: Diagnosis not present

## 2020-10-29 DIAGNOSIS — R7309 Other abnormal glucose: Secondary | ICD-10-CM | POA: Diagnosis not present

## 2020-10-29 DIAGNOSIS — R7303 Prediabetes: Secondary | ICD-10-CM | POA: Diagnosis not present

## 2020-10-29 DIAGNOSIS — Z0000 Encounter for general adult medical examination without abnormal findings: Secondary | ICD-10-CM

## 2020-10-29 DIAGNOSIS — E782 Mixed hyperlipidemia: Secondary | ICD-10-CM

## 2020-10-29 DIAGNOSIS — K219 Gastro-esophageal reflux disease without esophagitis: Secondary | ICD-10-CM

## 2020-10-29 DIAGNOSIS — E559 Vitamin D deficiency, unspecified: Secondary | ICD-10-CM

## 2020-10-29 DIAGNOSIS — Z1211 Encounter for screening for malignant neoplasm of colon: Secondary | ICD-10-CM

## 2020-10-29 DIAGNOSIS — I480 Paroxysmal atrial fibrillation: Secondary | ICD-10-CM | POA: Diagnosis not present

## 2020-10-29 DIAGNOSIS — Z136 Encounter for screening for cardiovascular disorders: Secondary | ICD-10-CM

## 2020-10-29 DIAGNOSIS — Z79899 Other long term (current) drug therapy: Secondary | ICD-10-CM

## 2020-10-30 LAB — LIPID PANEL
Cholesterol: 190 mg/dL (ref <200)
HDL: 69 mg/dL (ref >=50)
LDL Cholesterol (Calc): 104 mg/dL (calc) — ABNORMAL HIGH
Non-HDL Cholesterol (Calc): 121 mg/dL (calc) (ref <130)
Total CHOL/HDL Ratio: 2.8 (calc) (ref <5.0)
Triglycerides: 82 mg/dL (ref <150)

## 2020-10-30 LAB — CBC WITH DIFFERENTIAL/PLATELET
Absolute Monocytes: 485 cells/uL (ref 200–950)
Basophils Absolute: 38 cells/uL (ref 0–200)
Basophils Relative: 0.6 %
Eosinophils Absolute: 69 cells/uL (ref 15–500)
Eosinophils Relative: 1.1 %
HCT: 42.2 % (ref 35.0–45.0)
Hemoglobin: 14.3 g/dL (ref 11.7–15.5)
Lymphs Abs: 1487 cells/uL (ref 850–3900)
MCH: 32.5 pg (ref 27.0–33.0)
MCHC: 33.9 g/dL (ref 32.0–36.0)
MCV: 95.9 fL (ref 80.0–100.0)
MPV: 11.5 fL (ref 7.5–12.5)
Monocytes Relative: 7.7 %
Neutro Abs: 4221 cells/uL (ref 1500–7800)
Neutrophils Relative %: 67 %
Platelets: 215 10*3/uL (ref 140–400)
RBC: 4.4 10*6/uL (ref 3.80–5.10)
RDW: 12.7 % (ref 11.0–15.0)
Total Lymphocyte: 23.6 %
WBC: 6.3 10*3/uL (ref 3.8–10.8)

## 2020-10-30 LAB — MICROSCOPIC MESSAGE

## 2020-10-30 LAB — URINALYSIS, ROUTINE W REFLEX MICROSCOPIC
Bilirubin Urine: NEGATIVE
Glucose, UA: NEGATIVE
Hgb urine dipstick: NEGATIVE
Hyaline Cast: NONE SEEN /LPF
Ketones, ur: NEGATIVE
Nitrite: NEGATIVE
Protein, ur: NEGATIVE
Specific Gravity, Urine: 1.02 (ref 1.001–1.035)
pH: 6 (ref 5.0–8.0)

## 2020-10-30 LAB — MICROALBUMIN / CREATININE URINE RATIO
Creatinine, Urine: 167 mg/dL (ref 20–275)
Microalb Creat Ratio: 9 mcg/mg creat (ref <30)
Microalb, Ur: 1.5 mg/dL

## 2020-10-30 LAB — COMPLETE METABOLIC PANEL WITH GFR
AG Ratio: 2 (calc) (ref 1.0–2.5)
ALT: 17 U/L (ref 6–29)
AST: 19 U/L (ref 10–35)
Albumin: 4.3 g/dL (ref 3.6–5.1)
Alkaline phosphatase (APISO): 59 U/L (ref 37–153)
BUN: 18 mg/dL (ref 7–25)
CO2: 29 mmol/L (ref 20–32)
Calcium: 9.9 mg/dL (ref 8.6–10.4)
Chloride: 104 mmol/L (ref 98–110)
Creat: 0.8 mg/dL (ref 0.60–0.95)
Globulin: 2.2 g/dL (calc) (ref 1.9–3.7)
Glucose, Bld: 100 mg/dL — ABNORMAL HIGH (ref 65–99)
Potassium: 4.2 mmol/L (ref 3.5–5.3)
Sodium: 140 mmol/L (ref 135–146)
Total Bilirubin: 0.9 mg/dL (ref 0.2–1.2)
Total Protein: 6.5 g/dL (ref 6.1–8.1)
eGFR: 74 mL/min/{1.73_m2} (ref >=60)

## 2020-10-30 LAB — HEMOGLOBIN A1C
Hgb A1c MFr Bld: 5.7 % of total Hgb — ABNORMAL HIGH (ref <5.7)
Mean Plasma Glucose: 117 mg/dL
eAG (mmol/L): 6.5 mmol/L

## 2020-10-30 LAB — INSULIN, RANDOM: Insulin: 8 u[IU]/mL

## 2020-10-30 LAB — MAGNESIUM: Magnesium: 2.1 mg/dL (ref 1.5–2.5)

## 2020-10-30 LAB — VITAMIN D 25 HYDROXY (VIT D DEFICIENCY, FRACTURES): Vit D, 25-Hydroxy: 76 ng/mL (ref 30–100)

## 2020-10-30 LAB — TSH: TSH: 2.74 mIU/L (ref 0.40–4.50)

## 2020-10-30 NOTE — Progress Notes (Signed)
============================================================ ============================================================  -    U/A  -   OK  ============================================================ ============================================================  -  Chol =  190 - Great    & LDL Cholesterol  borderline = 104 -    - Recommend a stricter low cholesterol diet   - Cholesterol only comes from animal sources  - ie. meat, dairy, egg yolks  - Eat all the vegetables you want.  - Avoid meat, especially red meat - Beef AND Pork .  - Avoid cheese & dairy - milk & ice cream.     - Cheese is the most concentrated form of trans-fats which  is the worst thing to clog up our arteries.   - Veggie cheese is OK which can be found in the fresh  produce section at [employer] or [employer] or [employer] ============================================================ ============================================================  -  5.7% - OK - essentially Normal  ============================================================ ============================================================  -  Vitamin D = 76 - Excellent  !  - Please keep dose same  ============================================================ ============================================================  -  All Else - CBC - Kidneys - Electrolytes - Liver - Magnesium & Thyroid    - all  Normal / OK ===========================================================  ============================================================

## 2020-11-01 ENCOUNTER — Other Ambulatory Visit: Payer: Self-pay | Admitting: *Deleted

## 2020-11-01 NOTE — Patient Outreach (Signed)
[place] [facility]) Care Management  [place]  11/01/2020   Darlene [name] 04-25-1940 [id number]  Subjective: Successful telephone outreach call to patient. HIPAA identifiers obtained. Patient states that she has been paying out of pocket for Eliquis for the last several months. She has received documents from the manufacturer for assistance. Nurse will place a pharmacy referral for assistance and suggested that the patient contact her providers to ask for samples. Patient states she is dong well at this time. Nurse discussed with patient her health goals and her health and wellness needs which were documented in the Epic system. Patient did not have any further questions or concerns today and did confirm that she has this nurse's contact number to call her if needed.   Encounter Medications:  Outpatient Encounter Medications as of 11/01/2020  Medication Sig   acetaminophen (TYLENOL) 500 MG tablet Take 500 mg by mouth every 6 (six) hours as needed.   apixaban (ELIQUIS) 5 MG TABS tablet Take 1 tablet (5 mg total) by mouth 2 (two) times daily.   Ascorbic Acid (VITAMIN C) 500 MG CAPS Take 500 mg by mouth.    CHOLECALCIFEROL PO Take 5,000 Units by mouth daily.   cyanocobalamin 1000 MCG tablet Take 1,000 mcg by mouth daily.   cyclobenzaprine (FLEXERIL) 10 MG tablet Take 1/2 to 1 tablet    2 to 3 x /day if needed for Muscle Spasms   diltiazem (CARDIZEM CD) 300 MG 24 hr capsule Take 1 capsule (300 mg total) by mouth daily.   ferrous sulfate 325 (65 FE) MG tablet Take 1 tablet (325 mg total) by mouth every other day.   lansoprazole (PREVACID) 30 MG capsule TAKE 1 CAPSULE BY MOUTH  DAILY FOR HEARTBURN AND  INDIGESTION   Magnesium 250 MG TABS Take by mouth.   metoprolol tartrate (LOPRESSOR) 25 MG tablet TAKE 2 TABLETS BY MOUTH ONCE DAILY IN THE MORNING AND 1 TABLET ONCE DAILY IN THE EVENING   nitroGLYCERIN (NITROSTAT) 0.4 MG SL tablet Place 1 tablet (0.4 mg total) under the tongue  every 5 (five) minutes as needed for chest pain.   PARoxetine (PAXIL) 20 MG tablet TAKE 1 TABLET BY MOUTH  DAILY FOR MOOD   Probiotic Product (PROBIOTIC DAILY PO) Take 1 tablet by mouth daily.   psyllium (METAMUCIL) 58.6 % packet Take 1 packet by mouth daily.   zinc gluconate 50 MG tablet Take 50 mg by mouth daily.   No facility-administered encounter medications on file as of 11/01/2020.    Functional Status:  In your present state of health, do you have any difficulty performing the following activities: 10/28/2020 04/03/2020  Hearing? N N  Vision? N N  Difficulty concentrating or making decisions? N N  Walking or climbing stairs? N Y  Comment - avoids stairs, 2-3 steps only, can manage slowly with handrails, manages at home  Dressing or bathing? N N  Doing errands, shopping? N Y  Comment - family Restaurant manager, fast food and eating ? - N  Using the Toilet? - N  In the past six months, have you accidently leaked urine? - Y  Comment - chronic unchanged  Do you have problems with loss of bowel control? - N  Managing your Medications? - N  Managing your Finances? - N  Housekeeping or managing your Housekeeping? - Y  Comment - family assists as needed  Some recent data might be hidden    Fall/Depression Screening: Fall Risk  11/01/2020 10/28/2020 06/21/2020  Falls  in the past year? 0 0 1  Number falls in past yr: 0 - 1  Comment - - -  Injury with Fall? 0 - 0  Risk for fall due to : Impaired mobility;Impaired balance/gait;History of fall(s) No Fall Risks -  Follow up Falls prevention discussed;Education provided;Falls evaluation completed Education provided;Falls evaluation completed;Falls prevention discussed -   PHQ 2/9 Scores 04/03/2020 03/20/2020 10/09/2019 05/28/2019 04/28/2019 04/03/2019 09/04/2018  PHQ - 2 Score 0 0 0 0 2 0 0  PHQ- 9 Score 1 - - - 4 - -    Assessment:   Care Plan Care Plan : Atrial Fibrillation (Adult)  Updates made by Michiel Cowboy, RN since 11/01/2020 12:00 AM      Problem: Dysrhythmia (Atrial Fibrillation)   Priority: High     Long-Range Goal: Heart Rate and Rhythm Monitored and Managed   Start Date: 03/20/2020  Expected End Date: 04/14/2021  Note:   Evidence-based guidance:  Assess heart rate, rhythm and presence of symptoms at each encounter using pulse palpation, blood pressure monitor and review of symptom diary.  Consider pulse check plus an electrocardiogram (ECG) in women over the age of 26 years.  Prepare patient for diagnostic studies, such as 24-hour ambulatory monitoring, echocardiogram or implantable loop recorder, based on presentation and risk factors, such as cryptogenic stroke.  Prepare patient for use of pharmacologic therapy, such as beta-blocker, calcium channel blocker, digoxin, antiarrhythmic, based on presentation, risk factors and patient preferences.  Monitor side effects and expect periodic adjustments.  Provide an opportunity for shared decision-making when uncontrolled rate and rhythm persist and invasive therapy is considered, such as left atrial ablation, pacemaker, cardioversion or cardiac resynchronization therapy.  Provide reassurance, as initial symptoms and potential recurrence are frightening to patient and family/caregiver and may impact quality of life.  Provide prompt follow-up after hospitalization to support transition of care; consider referral to home care or community support program, especially in presence of comorbidity.  Encourage exercise 2 to 3 times per week, based on ability and tolerance, to improve physical and cardiac function and quality of life.   Notes:     Task: Alleviate Barriers to Dysrhythmia Management   Due Date: 04/14/2021  Note:   Care Management Activities:    - activity or exercise based on tolerance encouraged - barriers to lifestyle changes reviewed and addressed - medication-adherence assessment completed - medication side effects monitored and managed - pulse rate and rhythm  assessed - response to pharmacologic therapy monitored - screen for functional limitations reviewed - rescue (action) plan use encouraged    Notes:       Goals Addressed             This Visit's Progress    [facility]) Client will report abillity to obtain Medications within the next 90 days       Timeframe:  Long-Range Goal Priority:  High Start Date:  11/01/20                           Expected End Date: 03/15/21 Follow Up Date: 02/12/21  Notes 11/01/20: Patient explained that she has been paying out of pocket for her Eliquis. Patient states she did receive information from the manufacturer in the mail for assistance. Nurse will send a pharmacy referral for Eliquis assistance. Nurse suggested that the patient contact her providers to ask for samples.                             (  [facility]) Make and Keep All Appointments       Timeframe:  Long-Range Goal Priority:  Medium Start Date:  11/01/20                          Expected End Date: 02/12/21                      Follow Up Date 02/12/21    -Discussed calling to make an annual eye appointment -Discussed calling cardiologist to make a 6 month follow up appointment    Why is this important?   Part of staying healthy is seeing the doctor for follow-up care.  If you forget your appointments, there are some things you can do to stay on track.    Notes: 11/01/20: Patient states she will call to make an eye appointment and a 6 month follow up appointment with her cardiologist.     [facility]) Patient will verbalize taking her B/P and pulse 2-3 a week and recording the values.   Not on track      Timeframe:  Long-Range Goal Priority:  High Start Date:  03/20/20                           Expected End Date: 04/14/21                       Follow Up Date 02/12/21   - make a plan to exercise regularly - make a plan to eat healthy - take medicine as prescribed  -Discussed with patient to take her B/P, pulse and record the values 2-3 times  weekly -Explained that Afib may not have any symptoms and taking your pulse routinely will help you to discover heart rate changes that you may not feel. -Discussed with patient to do chair exercises once weekly  -Encouraged patient to limit salt intake -Discussed placing B/P monitor and calendar booklet right by her medications to remind her to take her B/P and pulse -Discussed increasing the amount of healthy fluids she drinks daily to avoid dehydration and to continue to drink 1 bottle of water daily   Why is this important?   Atrial fibrillation may have no symptoms. Sometimes the symptoms get worse or happen more often.  It is important to keep track of what your symptoms are and when they happen.  A change in symptoms is important to discuss with your doctor or nurse.  Being active and healthy eating will also help you manage your heart condition.     Notes: 11/01/20: Patient states that she has not been taking her B/P and pulse 2-3 times weekly. Nurse suggested placing her B/P monitor by her medications to help remind her. Patient reports that she has been doing chair exercises weekly and she continues to assist her daughter with caring for 2 young children which keeps her active. She does drink a bottle of water daily. Nurse encouraged the patient to increase her healthy fluid intake to help to avoid dehydration.  Update 06/21/20: Patient states she will take her B/P, pulse, and record the values 2-3 times weekly. Patient states she will do chair exercises once weekly, drink a bottle of water daily, and limit her salt intake as much as possible. Nurse will send patient chair exercise printouts, a planning healthy meals booklet, and a calendar booklet.  Patient reports checking her B/P and pulse several times  weekly. She takes her medication as prescribed and reports having a good appetite. Nurse encouraged patient to increase her physical activity, increase her fluid and water intake, and to  eat a healthy diet. Nurse will send patient chair exercise printouts and a planning healthy meals booklet.        Plan: RN Health Coach will send PCP a quarterly update, will send a pharmacy referral, and will call patient within the month of November. Follow-up: Patient agrees to Care Plan and Follow-up.  [name] RN, BSN [place] [phone] [email or web address]

## 2020-11-01 NOTE — Patient Instructions (Addendum)
Goals Addressed             This Visit's Progress    [facility]) Client will report abillity to obtain Medications within the next 90 days       Timeframe:  Long-Range Goal Priority:  High Start Date:  11/01/20                           Expected End Date: 03/15/21 Follow Up Date: 02/12/21  Notes 11/01/20: Patient explained that she has been paying out of pocket for her Eliquis. Patient states she did receive information from the manufacturer in the mail for assistance. Nurse will send a pharmacy referral for Eliquis assistance. Nurse suggested that the patient contact her providers to ask for samples.                             [facility]) Make and Keep All Appointments       Timeframe:  Long-Range Goal Priority:  Medium Start Date:  11/01/20                          Expected End Date: 02/12/21                      Follow Up Date 02/12/21    -Discussed calling to make an annual eye appointment -Discussed calling cardiologist to make a 6 month follow up appointment    Why is this important?   Part of staying healthy is seeing the doctor for follow-up care.  If you forget your appointments, there are some things you can do to stay on track.    Notes: 11/01/20: Patient states she will call to make an eye appointment and a 6 month follow up appointment with her cardiologist.     [facility]) Patient will verbalize taking her B/P and pulse 2-3 a week and recording the values.   Not on track      Timeframe:  Long-Range Goal Priority:  High Start Date:  03/20/20                           Expected End Date: 04/14/21                       Follow Up Date 02/12/21   - make a plan to exercise regularly - make a plan to eat healthy - take medicine as prescribed  -Discussed with patient to take her B/P, pulse and record the values 2-3 times weekly -Explained that Afib may not have any symptoms and taking your pulse routinely will help you to discover heart rate changes that you may not feel. -Discussed  with patient to do chair exercises once weekly  -Encouraged patient to limit salt intake -Discussed placing B/P monitor and calendar booklet right by her medications to remind her to take her B/P and pulse -Discussed increasing the amount of healthy fluids she drinks daily to avoid dehydration and to continue to drink 1 bottle of water daily   Why is this important?   Atrial fibrillation may have no symptoms. Sometimes the symptoms get worse or happen more often.  It is important to keep track of what your symptoms are and when they happen.  A change in symptoms is important to discuss with your doctor or nurse.  Being active  and healthy eating will also help you manage your heart condition.     Notes: 11/01/20: Patient states that she has not been taking her B/P and pulse 2-3 times weekly. Nurse suggested placing her B/P monitor by her medications to help remind her. Patient reports that she has been doing chair exercises weekly and she continues to assist her daughter with caring for 2 young children which keeps her active. She does drink a bottle of water daily. Nurse encouraged the patient to increase her healthy fluid intake to help to avoid dehydration.  Update 06/21/20: Patient states she will take her B/P, pulse, and record the values 2-3 times weekly. Patient states she will do chair exercises once weekly, drink a bottle of water daily, and limit her salt intake as much as possible. Nurse will send patient chair exercise printouts, a planning healthy meals booklet, and a calendar booklet.  Patient reports checking her B/P and pulse several times weekly. She takes her medication as prescribed and reports having a good appetite. Nurse encouraged patient to increase her physical activity, increase her fluid and water intake, and to eat a healthy diet. Nurse will send patient chair exercise printouts and a planning healthy meals booklet.

## 2020-11-01 NOTE — Patient Outreach (Deleted)
[place] [facility]) Care Management  11/01/2020  Darlene [name] 01-13-1941 [id number]  Unsuccessful outreach attempt made to patient. RN Health Coach left HIPAA compliant voicemail message along with her contact information.  Plan: RN Health Coach will call patient within the month of September.  [name] RN, BSN [place] [phone] [email or web address]

## 2020-11-04 NOTE — Patient Outreach (Signed)
[facility] [facility]) Care Management  11/04/2020  Darlene [name] Feb 25, 1941 [id number]  Referral for medication assistance from [name], RN sent to [place].  Ina Homes [facility] Management Assistant [phone]

## 2020-11-04 NOTE — Progress Notes (Signed)
Erroneous encounter

## 2020-11-06 DIAGNOSIS — H353132 Nonexudative age-related macular degeneration, bilateral, intermediate dry stage: Secondary | ICD-10-CM | POA: Diagnosis not present

## 2020-11-06 DIAGNOSIS — H43813 Vitreous degeneration, bilateral: Secondary | ICD-10-CM | POA: Diagnosis not present

## 2020-11-06 DIAGNOSIS — Z961 Presence of intraocular lens: Secondary | ICD-10-CM | POA: Diagnosis not present

## 2020-11-06 DIAGNOSIS — H26493 Other secondary cataract, bilateral: Secondary | ICD-10-CM | POA: Diagnosis not present

## 2020-11-19 ENCOUNTER — Telehealth: Payer: Self-pay | Admitting: Pharmacy Technician

## 2020-11-19 DIAGNOSIS — Z596 Low income: Secondary | ICD-10-CM

## 2020-11-19 NOTE — Progress Notes (Signed)
[facility])                                            [place] Team    11/19/2020  TRENITA RIVERE [birth year] [id number]   Received both patient and provider portion(s) of patient assistance application(s) for liquis. Faxed completed application and required documents into BMS.    Demetris Capell P. Katelyn Broadnax, [place]  [phone]

## 2020-11-22 ENCOUNTER — Telehealth: Payer: Self-pay | Admitting: Pharmacy Technician

## 2020-11-22 DIAGNOSIS — Z596 Low income: Secondary | ICD-10-CM

## 2020-11-22 NOTE — Progress Notes (Signed)
[facility])                                            [place] Team    11/22/2020  VIVYANNA MASRI 12/30/1940 [id number]  Care coordination call placed to [employer] in regards to Eliquis determination.  Spoke to [place] who informed that application is on hold until patient spends $86.65 more for prescriptions as they require patient's spend 3% of household income to be considered for the program. She informs information would need to be received by 02/27/21.  Successful outreach call placed to patient's daughter [name]. HIPAA verified.  Informed [name] of the above information and she feels that patient has spent that as the date of the OOP report submitted to BMS was from July. [name] informed she would gather necessary information and call me back when received.  Xane Amsden P. Eaden Hettinger, [place]  [phone]

## 2020-12-03 ENCOUNTER — Ambulatory Visit
Admission: EM | Admit: 2020-12-03 | Discharge: 2020-12-03 | Disposition: A | Discharge status: Home / Self Care | Payer: Medicare Other | Attending: Family Medicine | Admitting: Family Medicine

## 2020-12-03 ENCOUNTER — Other Ambulatory Visit: Payer: Self-pay

## 2020-12-03 DIAGNOSIS — H1033 Unspecified acute conjunctivitis, bilateral: Secondary | ICD-10-CM | POA: Diagnosis not present

## 2020-12-03 DIAGNOSIS — J069 Acute upper respiratory infection, unspecified: Secondary | ICD-10-CM

## 2020-12-03 MED ORDER — TOBRAMYCIN 0.3 % OP SOLN: 1.0000 [drp] | Freq: Four times a day (QID) | OPHTHALMIC | 0 refills | Status: DC

## 2020-12-03 MED ORDER — BENZONATATE 100 MG PO CAPS: ORAL_CAPSULE | ORAL | 0 refills | Status: DC

## 2020-12-03 NOTE — ED Triage Notes (Signed)
Pt presents with c/o right eye redness and itching and drainage

## 2020-12-03 NOTE — ED Provider Notes (Signed)
[place]   [id number] 12/03/20 Arrival Time: [place]  ASSESSMENT & PLAN:  1. Acute conjunctivitis of both eyes, unspecified acute conjunctivitis type   2. Viral URI with cough    Discussed typical duration of viral illnesses. OTC symptom care as needed. Eye care discussed.  Meds ordered this encounter  Medications   tobramycin (TOBREX) 0.3 % ophthalmic solution    Sig: Place 1 drop into the right eye every 6 (six) hours.    Dispense:  5 mL    Refill:  0   benzonatate (TESSALON) 100 MG capsule    Sig: Take 1 capsule by mouth every 8 (eight) hours for cough.    Dispense:  21 capsule    Refill:  0     Follow-up Information     [facility].   Specialty: Urgent Care Why: If worsening or failing to improve as anticipated. Contact information: [address] [place] [address] [phone]                Reviewed expectations re: course of current medical issues. Questions answered. Outlined signs and symptoms indicating need for more acute intervention. Understanding verbalized. After Visit Summary given.   SUBJECTIVE: History from: patient. Darlene [name] is a 80 y.o. female who reports "pinkeye"; x 3-4 days; itchy/gritty; bilateral; watery drainage. No contact lens use. Normal vision. No specific eye pain reported. Also with dry cough; few days. Denies: fever. Normal PO intake without n/v/d. Baseline SOB secondary to A. Fib; no worsening.  OBJECTIVE:  Vitals:   12/03/20 1119  BP: 112/70  Pulse: 66  Resp: 18  Temp: 98.4 F (36.9 C)  SpO2: 93%    General appearance: alert; no distress Eyes: PERRLA; EOMI; conjunctivae injection and watery drainage bilaterally HENT: [place]; AT; with mild nasal congestion Neck: supple  Lungs: speaks full sentences without difficulty; unlabored Extremities: no edema Skin: warm and dry Neurologic: normal gait Psychological: alert and cooperative; normal mood  and affect   Allergies  Allergen Reactions   Cephalosporins Rash    Past Medical History:  Diagnosis Date   Anxiety    Chronic lower back pain    Depression    DJD (degenerative joint disease)    knees, neck   GERD (gastroesophageal reflux disease)    has had dilitation in the past   Hypertension    Social History   Socioeconomic History   Marital status: Widowed    Spouse name: Not on file   Number of children: 2   Years of education: Not on file   Highest education level: 12th grade  Occupational History   Occupation: 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  Tobacco Use   Smoking status: Never   Smokeless tobacco: Never  Vaping Use   Vaping Use: Never used  Substance and Sexual Activity   Alcohol use: Yes    Alcohol/week: 3.0 standard drinks    Types: 3 Standard drinks or equivalent per week    Comment: 3 nights out of the week   Drug use: No   Sexual activity: Not Currently  Other Topics Concern   Not on file  Social History Narrative   Not on file   Social Determinants of Health   Financial Resource Strain: Not on file  Food Insecurity: No Food Insecurity   Worried About Running Out of Food in the Last Year: Never true   Ran Out of Food in the Last Year: Never true  Transportation Needs: No Data processing manager (Medical): No   Lack of Transportation (Non-Medical): No  Physical Activity: Not on file  Stress: Not on file  Social Connections: Not on file  Intimate Partner Violence: Not on file   Family History  Problem Relation Age of Onset   Hypertension Mother    Heart disease Mother    Hypertension Father    Heart disease Father    Tremor Sister    Dementia Brother    Hypertension Daughter    Colon cancer Neg Hx    Esophageal cancer Neg Hx    Rectal cancer Neg Hx    Stomach cancer Neg Hx    Past Surgical History:  Procedure Laterality Date   ABDOMINAL HYSTERECTOMY  03/1979   BREAST BIOPSY Left 1986 and 1987    CATARACT EXTRACTION, BILATERAL Bilateral 2014   Dr. Madelin Headings, in [place], [place], [place] 12/03/20 1137

## 2020-12-05 DIAGNOSIS — G4733 Obstructive sleep apnea (adult) (pediatric): Secondary | ICD-10-CM | POA: Diagnosis not present

## 2020-12-09 ENCOUNTER — Telehealth: Payer: Self-pay | Admitting: Pharmacy Technician

## 2020-12-09 ENCOUNTER — Ambulatory Visit: Payer: Medicare Other | Admitting: Internal Medicine

## 2020-12-09 ENCOUNTER — Other Ambulatory Visit: Payer: Self-pay | Admitting: *Deleted

## 2020-12-09 DIAGNOSIS — Z596 Low income: Secondary | ICD-10-CM

## 2020-12-09 NOTE — Telephone Encounter (Signed)
Received fax from [employer] stating that pt has been approved for Eliquis pt assistance. Daughter [name] notified.

## 2020-12-09 NOTE — Progress Notes (Signed)
[facility])                                            [place] Team    12/09/2020  Darlene [name] 21-Jul-1940 [id number]  Care coordination call placed to BMS in regards to Eliquis application.  Spoke to [place] who informed patient was APPROVED 12/06/20-03/15/21. She informed a 90 days supply would be delivered to the patient's home.  [name] P. Chynah Orihuela, [place]  [phone]

## 2020-12-18 NOTE — Progress Notes (Signed)
Assessment and Plan:  Diagnoses and all orders for this visit:  Bronchitis -     azithromycin (ZITHROMAX) 250 MG tablet; Take 2 tablets (500 mg) on  Day 1,  followed by 1 tablet (250 mg) once daily on Days 2 through 5. -     dexamethasone (DECADRON) 1 MG tablet; Take 3 tabs for 3 days, 2 tabs for 3 days 1 tab for 5 days. Take with food. -     ipratropium-albuterol (DUONEB) 0.5-2.5 (3) MG/3ML SOLN; Take 3 mLs by nebulization every 4 (four) hours as needed. Max:6 doses per day -     promethazine-dextromethorphan (PROMETHAZINE-DM) 6.25-15 MG/5ML syrup; Take 5 mLs by mouth 4 (four) times daily as needed for cough. -     DG Chest 2 View; Future   Use Mucinex DM as needed and Nebulizer with Duoneb q 6 hours as needed  Monitor symptoms, if no improvement by Monday please call the office!!    Further disposition pending results of labs. Discussed med's effects and SE's.   Over 30 minutes of exam, counseling, chart review, and critical decision making was performed.   Future Appointments  Date Time Provider [place]  01/27/2021  4:00 PM Arnoldo Lenis, MD [email or web address]  02/03/2021 10:00 AM Wine, Argie Ramming, RN [facility] None  04/03/2021 10:30 AM [name], NP [facility] None  05/21/2021  3:00 PM [name], NP [facility] None  07/02/2021 10:30 AM [name], MD [facility] None  10/29/2021 11:00 AM [name], MD [facility] None    ------------------------------------------------------------------------------------------------------------------   HPI BP 112/80   Pulse 65   Temp 97.9 F (36.6 C)   Wt 226 lb 9.6 oz (102.8 kg)   SpO2 97%   BMI 32.51 kg/m  79 y.o.female presents for Nonproductive cough  Pt presents for persistent wet cough for a month, unable to bring any mucus up  Extreme fatigue. Pt is having head congestion and nasal drainage of clear mucus. She is having headaches daily. Denies fever/chills, myalgias.  Took home covid test that was negative.  Has used Dayquil cold and sinus with minimal relief  Past Medical History:  Diagnosis Date   Anxiety    Chronic lower back pain    Depression    DJD (degenerative joint disease)    knees, neck   GERD (gastroesophageal reflux disease)    has had dilitation in the past   Hypertension      Allergies  Allergen Reactions   Cephalosporins Rash    Current Outpatient Medications on File Prior to Visit  Medication Sig   acetaminophen (TYLENOL) 500 MG tablet Take 500 mg by mouth every 6 (six) hours as needed.   apixaban (ELIQUIS) 5 MG TABS tablet Take 1 tablet (5 mg total) by mouth 2 (two) times daily.   Ascorbic Acid (VITAMIN C) 500 MG CAPS Take 500 mg by mouth.    CHOLECALCIFEROL PO Take 5,000 Units by mouth daily.   cyanocobalamin 1000 MCG tablet Take 1,000 mcg by mouth daily.   cyclobenzaprine (FLEXERIL) 10 MG tablet Take 1/2 to 1 tablet    2 to 3 x /day if needed for Muscle Spasms   diltiazem (CARDIZEM CD) 300 MG 24 hr capsule Take 1 capsule (300 mg total) by mouth daily.   ferrous sulfate 325 (65 FE) MG tablet Take 1 tablet (325 mg total) by mouth every other day.   lansoprazole (PREVACID) 30 MG capsule TAKE 1 CAPSULE BY MOUTH  DAILY FOR HEARTBURN AND  INDIGESTION   Magnesium  250 MG TABS Take by mouth.   metoprolol tartrate (LOPRESSOR) 25 MG tablet TAKE 2 TABLETS BY MOUTH ONCE DAILY IN THE MORNING AND 1 TABLET ONCE DAILY IN THE EVENING   nitroGLYCERIN (NITROSTAT) 0.4 MG SL tablet Place 1 tablet (0.4 mg total) under the tongue every 5 (five) minutes as needed for chest pain.   PARoxetine (PAXIL) 20 MG tablet TAKE 1 TABLET BY MOUTH  DAILY FOR MOOD   Probiotic Product (PROBIOTIC DAILY PO) Take 1 tablet by mouth daily.   psyllium (METAMUCIL) 58.6 % packet Take 1 packet by mouth daily.   zinc gluconate 50 MG tablet Take 50 mg by mouth daily.   benzonatate (TESSALON) 100 MG capsule Take 1 capsule by mouth every 8 (eight) hours for cough. (Patient not taking: Reported on 12/19/2020)    tobramycin (TOBREX) 0.3 % ophthalmic solution Place 1 drop into the right eye every 6 (six) hours. (Patient not taking: Reported on 12/19/2020)   No current facility-administered medications on file prior to visit.    ROS: all negative except above.   Physical Exam:  BP 112/80   Pulse 65   Temp 97.9 F (36.6 C)   Wt 226 lb 9.6 oz (102.8 kg)   SpO2 97%   BMI 32.51 kg/m   General Appearance: Well nourished, in no apparent distress. Eyes: PERRLA, EOMs, conjunctiva no swelling or erythema Sinuses: Positive maxillary tenderness ENT/Mouth: Ext aud canals clear, TMs without erythema, bulging. No erythema, swelling, or exudate on post pharynx.  Tonsils are erythematous, no exudate Hearing normal. Nasal mucosa erythematous Neck: Supple, thyroid normal.  Respiratory: Respiratory effort normal, BS equal bilaterally without rales, rhonchi, wheezing or stridor, slightly diminished at bases Cardio: RRR with no MRGs. Brisk peripheral pulses without edema.  Abdomen: Soft, + BS.  Non tender, no guarding, rebound, hernias, masses. Lymphatics: Non tender without lymphadenopathy.  Musculoskeletal: Full ROM, 5/5 strength, normal gait.  Skin: Warm, dry without rashes, lesions, ecchymosis.  Neuro: Cranial nerves intact. Normal muscle tone, no cerebellar symptoms. Sensation intact.  Psych: Awake and oriented X 3, normal affect, Insight and Judgment appropriate.     Magda Bernheim, NP 9:33 AM [facility]

## 2020-12-19 ENCOUNTER — Encounter: Payer: Self-pay | Admitting: Nurse Practitioner

## 2020-12-19 ENCOUNTER — Ambulatory Visit
Admission: RE | Admit: 2020-12-19 | Discharge: 2020-12-19 | Disposition: A | Discharge status: Home / Self Care | Payer: Medicare Other | Source: Ambulatory Visit | Attending: Nurse Practitioner | Admitting: Nurse Practitioner

## 2020-12-19 ENCOUNTER — Other Ambulatory Visit: Payer: Self-pay

## 2020-12-19 ENCOUNTER — Ambulatory Visit (INDEPENDENT_AMBULATORY_CARE_PROVIDER_SITE_OTHER): Payer: Medicare Other | Admitting: Nurse Practitioner

## 2020-12-19 VITALS — BP 112/80 | HR 65 | Temp 97.9°F | Wt 226.6 lb

## 2020-12-19 DIAGNOSIS — J40 Bronchitis, not specified as acute or chronic: Secondary | ICD-10-CM

## 2020-12-19 DIAGNOSIS — R059 Cough, unspecified: Secondary | ICD-10-CM | POA: Diagnosis not present

## 2020-12-19 DIAGNOSIS — R0602 Shortness of breath: Secondary | ICD-10-CM | POA: Diagnosis not present

## 2020-12-19 MED ORDER — AZITHROMYCIN 250 MG PO TABS: ORAL_TABLET | ORAL | 1 refills | Status: DC

## 2020-12-19 MED ORDER — PROMETHAZINE-DM 6.25-15 MG/5ML PO SYRP: 5.0000 mL | ORAL_SOLUTION | Freq: Four times a day (QID) | ORAL | 1 refills | Status: DC | PRN

## 2020-12-19 MED ORDER — IPRATROPIUM-ALBUTEROL 0.5-2.5 (3) MG/3ML IN SOLN: 3.0000 mL | RESPIRATORY_TRACT | 0 refills | Status: DC | PRN

## 2020-12-19 MED ORDER — DEXAMETHASONE 1 MG PO TABS
ORAL_TABLET | ORAL | 0 refills | Status: DC
Start: 2020-12-19 — End: 2021-04-01

## 2020-12-19 NOTE — Patient Instructions (Signed)
Use Nebulizer with Ipratropium bromide/albuterol sulfate solution every 6 hours as needed  Chest xray ordered at [facility]- Please go today to get, can just walk in do not need to have an appointment  Acute Bronchitis, Adult Acute bronchitis is sudden or acute swelling of the air tubes (bronchi) in the lungs. Acute bronchitis causes these tubes to fill with mucus, which can make it hard to breathe. It can also cause coughing or wheezing. In adults, acute bronchitis usually goes away within 2 weeks. A cough caused by bronchitis may last up to 3 weeks. Smoking, allergies, and asthma can make the condition worse. What are the causes? This condition can be caused by germs and by substances that irritate the lungs, including: Cold and flu viruses. The most common cause of this condition is the virus that causes the common cold. Bacteria. Substances that irritate the lungs, including: Smoke from cigarettes and other forms of tobacco. Dust and pollen. Fumes from chemical products, gases, or burned fuel. Other materials that pollute indoor or outdoor air. Close contact with someone who has acute bronchitis. What increases the risk? The following factors may make you more likely to develop this condition: A weak body's defense system, also called the immune system. A condition that affects your lungs and breathing, such as asthma. What are the signs or symptoms? Common symptoms of this condition include: Lung and breathing problems, such as: Coughing. This may bring up clear, yellow, or green mucus from your lungs (sputum). Wheezing. Having too much mucus in your lungs (chest congestion). Having shortness of breath. A fever. Chills. Aches and pains, including: Tightness in your chest and other body aches. A sore throat. How is this diagnosed? This condition is usually diagnosed based on: Your symptoms and medical history. A physical exam. You may also have  other tests, including tests to rule out other conditions, such as pneumonia. These tests include: A test of lung function. Test of a mucus sample to look for the presence of bacteria. Tests to check the oxygen level in your blood. Blood tests. Chest X-ray. How is this treated? Most cases of acute bronchitis clear up over time without treatment. Your health care provider may recommend: Drinking more fluids. This can thin your mucus, which may improve your breathing. Using a device that gets medicine into your lungs (inhaler) to help improve breathing and control coughing. Using a vaporizer or a humidifier. These are machines that add water to the air to help you breathe better. Taking a medicine for a fever. Taking a medicine that thins mucus and clears congestion (expectorant). Taking a medicine that prevents or stops coughing (cough suppressant). Follow these instructions at home: Activity Get plenty of rest. Return to your normal activities as told by your health care provider. Ask your health care provider what activities are safe for you. Lifestyle  Drink enough fluid to keep your urine pale yellow. Do not drink alcohol. Do not use any products that contain nicotine or tobacco, such as cigarettes, e-cigarettes, and chewing tobacco. If you need help quitting, ask your health care provider. Be aware that: Your bronchitis will get worse if you smoke or breathe in other people's smoke (secondhand smoke). Your lungs will heal faster if you quit smoking. General instructions Take over-the-counter and prescription medicines only as told by your health care provider. Use an inhaler, vaporizer, or humidifier as told by your health care provider. If you have a sore throat, gargle with a salt-water mixture  3-4 times a day or as needed. To make a salt-water mixture, completely dissolve -1 tsp (3-6 g) of salt in 1 cup (237 mL) of warm water. Take two teaspoons of honey at bedtime to lessen  coughing at night. Keep all follow-up visits as told by your health care provider. This is important. How is this prevented? To lower your risk of getting this condition again: Wash your hands often with soap and water. If soap and water are not available, use hand sanitizer. Avoid contact with people who have cold symptoms. Try not to touch your mouth, nose, or eyes with your hands. Avoid places where there are fumes from chemicals. Breathing these fumes will make your condition worse. Get the flu shot every year. Contact a health care provider if: Your symptoms do not improve after 2 weeks of treatment. You vomit more than once or twice. You have symptoms of dehydration such as: Dark urine. Dry skin or eyes. Increased thirst. Headaches. Confusion. Muscle cramps. Get help right away if you: Cough up blood. Feel pain in your chest. Have severe shortness of breath. Faint or keep feeling like you are going to faint. Have a severe headache. Have fever or chills that get worse. These symptoms may represent a serious problem that is an emergency. Do not wait to see if the symptoms will go away. Get medical help right away. Call your local emergency services (911 in the [place] Do not drive yourself to the hospital. Summary Acute bronchitis is sudden (acute) inflammation of the air tubes (bronchi) between the windpipe and the lungs. In adults, acute bronchitis usually goes away within 2 weeks, although coughing may last 3 weeks or longer. Take over-the-counter and prescription medicines only as told by your health care provider. Drink enough fluid to keep your urine pale yellow. Contact a health care provider if your symptoms do not improve after 2 weeks of treatment. Get help right away if you cough up blood, faint, or have chest pain or shortness of breath. This information is not intended to replace advice given to you by your health care provider. Make sure you discuss any questions you  have with your health care provider. Document Revised: 01/31/2020 Document Reviewed: 09/23/2018 Elsevier Patient Education  [place].

## 2021-01-22 ENCOUNTER — Other Ambulatory Visit: Payer: Self-pay | Admitting: Adult Health Nurse Practitioner

## 2021-01-27 ENCOUNTER — Other Ambulatory Visit: Payer: Self-pay

## 2021-01-27 ENCOUNTER — Encounter: Payer: Self-pay | Admitting: Cardiology

## 2021-01-27 ENCOUNTER — Ambulatory Visit: Payer: Medicare Other | Admitting: Cardiology

## 2021-01-27 VITALS — BP 128/70 | HR 58 | Ht 70.0 in | Wt 236.2 lb

## 2021-01-27 DIAGNOSIS — R0789 Other chest pain: Secondary | ICD-10-CM

## 2021-01-27 DIAGNOSIS — I4891 Unspecified atrial fibrillation: Secondary | ICD-10-CM | POA: Diagnosis not present

## 2021-01-27 NOTE — Progress Notes (Signed)
Clinical Summary Darlene [name] is a 80 y.o.female seen today for follow up of the following medical problems.    1. Afib - due to LAE rate control strategy has been pursued        - some recent palpitations. Occurs daily, lasts a few minutes. Occurs 1-2 times per day - overall not bothersome.     2. Chest pain - occurs with activity. Feeling of weight on chest. Mid chest, 8/10 in severity. Stops and rest, symptoms resolve      11/2019 nuclear stress: no ischemia - chronic symptoms unchanged.  - overall symptoms stable    3. Pulmonary HTN Jan 2021 echo normal RV, PASP 41 - PFTs were benign - sleep study did show severe OSA, likely etiology.  - compliant cpap machine. Followed by [facility]  - compliant with cpap   Past Medical History:  Diagnosis Date   Anxiety    Chronic lower back pain    Depression    DJD (degenerative joint disease)    knees, neck   GERD (gastroesophageal reflux disease)    has had dilitation in the past   Hypertension      Allergies  Allergen Reactions   Cephalosporins Rash     Current Outpatient Medications  Medication Sig Dispense Refill   acetaminophen (TYLENOL) 500 MG tablet Take 500 mg by mouth every 6 (six) hours as needed.     apixaban (ELIQUIS) 5 MG TABS tablet Take 1 tablet (5 mg total) by mouth 2 (two) times daily. 180 tablet 3   Ascorbic Acid (VITAMIN C) 500 MG CAPS Take 500 mg by mouth.      azithromycin (ZITHROMAX) 250 MG tablet Take 2 tablets (500 mg) on  Day 1,  followed by 1 tablet (250 mg) once daily on Days 2 through 5. 6 each 1   CHOLECALCIFEROL PO Take 5,000 Units by mouth daily.     cyanocobalamin 1000 MCG tablet Take 1,000 mcg by mouth daily.     cyclobenzaprine (FLEXERIL) 10 MG tablet Take 1/2 to 1 tablet    2 to 3 x /day if needed for Muscle Spasms 90 tablet 0   dexamethasone (DECADRON) 1 MG tablet Take 3 tabs for 3 days, 2 tabs for 3 days 1 tab for 5 days. Take with food. 20 tablet 0   diltiazem (CARDIZEM  CD) 300 MG 24 hr capsule Take 1 capsule (300 mg total) by mouth daily. 90 capsule 3   ferrous sulfate 325 (65 FE) MG tablet Take 1 tablet (325 mg total) by mouth every other day.     ipratropium-albuterol (DUONEB) 0.5-2.5 (3) MG/3ML SOLN Take 3 mLs by nebulization every 4 (four) hours as needed. Max:6 doses per day 540 mL 0   lansoprazole (PREVACID) 30 MG capsule TAKE 1 CAPSULE BY MOUTH  DAILY FOR HEARTBURN AND  INDIGESTION 90 capsule 3   Magnesium 250 MG TABS Take by mouth.     metoprolol tartrate (LOPRESSOR) 25 MG tablet TAKE 2 TABLETS BY MOUTH ONCE DAILY IN THE MORNING AND 1 TABLET ONCE DAILY IN THE EVENING 270 tablet 3   nitroGLYCERIN (NITROSTAT) 0.4 MG SL tablet Place 1 tablet (0.4 mg total) under the tongue every 5 (five) minutes as needed for chest pain. 25 tablet 3   PARoxetine (PAXIL) 20 MG tablet TAKE 1 TABLET BY MOUTH  DAILY FOR MOOD 90 tablet 3   Probiotic Product (PROBIOTIC DAILY PO) Take 1 tablet by mouth daily.     promethazine-dextromethorphan (PROMETHAZINE-DM)  6.25-15 MG/5ML syrup Take 5 mLs by mouth 4 (four) times daily as needed for cough. 240 mL 1   psyllium (METAMUCIL) 58.6 % packet Take 1 packet by mouth daily.     zinc gluconate 50 MG tablet Take 50 mg by mouth daily.     No current facility-administered medications for this visit.     Past Surgical History:  Procedure Laterality Date   ABDOMINAL HYSTERECTOMY  03/1979   BREAST BIOPSY Left 1986 and 1987   CATARACT EXTRACTION, BILATERAL Bilateral 2014   Dr. Madelin Headings, in [place]  Allergen Reactions   Cephalosporins Rash      Family History  Problem Relation Age of Onset   Hypertension Mother    Heart disease Mother    Hypertension Father    Heart disease Father    Tremor Sister    Dementia Brother    Hypertension Daughter    Colon cancer Neg Hx    Esophageal cancer Neg Hx    Rectal cancer Neg Hx    Stomach cancer Neg Hx      Social History Darlene [name] reports that she has never  smoked. She has never used smokeless tobacco. Darlene [name] reports current alcohol use of about 3.0 standard drinks per week.   Review of Systems CONSTITUTIONAL: No weight loss, fever, chills, weakness or fatigue.  HEENT: Eyes: No visual loss, blurred vision, double vision or yellow sclerae.No hearing loss, sneezing, congestion, runny nose or sore throat.  SKIN: No rash or itching.  CARDIOVASCULAR: per hpi RESPIRATORY: No shortness of breath, cough or sputum.  GASTROINTESTINAL: No anorexia, nausea, vomiting or diarrhea. No abdominal pain or blood.  GENITOURINARY: No burning on urination, no polyuria NEUROLOGICAL: No headache, dizziness, syncope, paralysis, ataxia, numbness or tingling in the extremities. No change in bowel or bladder control.  MUSCULOSKELETAL: No muscle, back pain, joint pain or stiffness.  LYMPHATICS: No enlarged nodes. No history of splenectomy.  PSYCHIATRIC: No history of depression or anxiety.  ENDOCRINOLOGIC: No reports of sweating, cold or heat intolerance. No polyuria or polydipsia.  Marland Kitchen   Physical Examination Today's Vitals   01/27/21 1545  BP: 128/70  Pulse: (!) 58  SpO2: 96%  Weight: 236 lb 3.2 oz (107.1 kg)  Height: 5\' 10"  (1.778 m)   Body mass index is 33.89 kg/m.  Gen: resting comfortably, no acute distress HEENT: no scleral icterus, pupils equal round and reactive, no palptable cervical adenopathy,  CV: irreg no m/r/g no jvd Resp: Clear to auscultation bilaterally GI: abdomen is soft, non-tender, non-distended, normal bowel sounds, no hepatosplenomegaly MSK: extremities are warm, no edema.  Skin: warm, no rash Neuro:  no focal deficits Psych: appropriate affect   Diagnostic Studies Echo 04/16/19 IMPRESSIONS     1. Left ventricular ejection fraction, by visual estimation, is 55 to  60%. The left ventricle has normal function. There is mildly increased  left ventricular hypertrophy.   2. Left ventricular diastolic parameters are  indeterminate.   3. The left ventricle has no regional wall motion abnormalities.   4. Global right ventricle has normal systolic function.The right  ventricular size is moderately enlarged. No increase in right ventricular  wall thickness.   5. Left atrial size was severely dilated.   6. Right atrial size was moderately dilated.   7. The mitral valve is normal in structure. Mild mitral valve  regurgitation. No evidence of mitral stenosis.   8. The tricuspid valve is normal in structure.   9. The  tricuspid valve is normal in structure. Tricuspid valve  regurgitation is mild.  10. The aortic valve is tricuspid. Aortic valve regurgitation is not  visualized. No evidence of aortic valve sclerosis or stenosis.  11. The pulmonic valve was not well visualized. Pulmonic valve  regurgitation is not visualized.  12. Moderately elevated pulmonary artery systolic pressure.  13. The inferior vena cava is dilated in size with <50% respiratory  variability, suggesting right atrial pressure of 15 mmHg.      11/2019 nuclear stress There was no ST segment deviation noted during stress. The study is normal. There are no perfusion defect consistentw with prior infarct or current ischemia. This is a low risk study. The left ventricular ejection fraction is normal (55-65%).      Assessment and Plan   1. Afib - mild infrequent symptoms, continue current meds - continue eliquis   2. Chest pain - chronic nonspecific symptoms that are stable, stress test last year was benign - continue to monitor   F/u 6 months  Arnoldo Lenis, M.D.

## 2021-01-27 NOTE — Patient Instructions (Signed)

## 2021-01-30 ENCOUNTER — Other Ambulatory Visit: Payer: Self-pay | Admitting: Adult Health

## 2021-01-30 DIAGNOSIS — Z1231 Encounter for screening mammogram for malignant neoplasm of breast: Secondary | ICD-10-CM

## 2021-02-03 ENCOUNTER — Other Ambulatory Visit: Payer: Self-pay | Admitting: *Deleted

## 2021-02-04 ENCOUNTER — Telehealth: Payer: Self-pay

## 2021-02-04 ENCOUNTER — Other Ambulatory Visit: Payer: Self-pay | Admitting: Nurse Practitioner

## 2021-02-04 DIAGNOSIS — G4737 Central sleep apnea in conditions classified elsewhere: Secondary | ICD-10-CM

## 2021-02-04 DIAGNOSIS — I272 Pulmonary hypertension, unspecified: Secondary | ICD-10-CM

## 2021-02-04 DIAGNOSIS — I4891 Unspecified atrial fibrillation: Secondary | ICD-10-CM

## 2021-02-04 DIAGNOSIS — J40 Bronchitis, not specified as acute or chronic: Secondary | ICD-10-CM | POA: Diagnosis not present

## 2021-02-04 MED ORDER — IPRATROPIUM-ALBUTEROL 0.5-2.5 (3) MG/3ML IN SOLN: 3.0000 mL | RESPIRATORY_TRACT | 0 refills | Status: DC | PRN

## 2021-02-04 NOTE — Telephone Encounter (Signed)
Patient is wanting a nebulizer machine ordered for her. The nebulizer treatments help her a lot she says.

## 2021-02-04 NOTE — Patient Outreach (Signed)
[facility]) Care Management  02/04/2021  Darlene [name] 1941-03-09 [id number]  [facility]) Care Management RN Health Coach Note   02/04/2021 Name:  Darlene [name] MRN:  [id number] DOB:  May 01, 1940  Summary: Patient reports that she had bronchitis in October. Her symptoms have improved but she does continue to feel fatigued and have SOB with exertion. Patient explained that the nebulizer treatments that were prescribed to treat her symptoms are beneficial. Patient adds that she has been using her daughter's nebulizer machine and would like to have her own nebulizer machine. Nurse called PCP's office and requested that a nebulizer machine be ordered for the patient. Patient had a recent cardiology appointment on 01/27/21. Nurse reviewed with the patient the importance of monitoring her pulse and B/P pressure routinely and when experiencing palpitations/ heart fluttering. Also, discussed to contact her providers right away for any concerning symptoms.   Recommendations/Changes made from today's visit: Monitor Pulse and B/P at least 2-3 times weekly and when experiencing palpitations/ heart fluttering Contact providers right away for any concerning symptoms Increase physical activity as tolerated Maintain a heart healthy diet  Subjective: Darlene [name] is an 80 y.o. year old female who is a primary patient of [name], MD. The care management team was consulted for assistance with care management and/or care coordination needs.    RN Health Coach completed Telephone Visit today.   Objective:  Medications Reviewed Today     Reviewed by Michiel Cowboy, RN (Registered Nurse) on 02/04/21 at [phone]  Med List Status: <None>   Medication Order Taking? Sig Documenting Provider Last Dose Status Informant  acetaminophen (TYLENOL) 500 MG tablet [id number] Yes Take 500 mg by mouth every 6 (six) hours as needed. [provider] Taking Active Self   apixaban (ELIQUIS) 5 MG TABS tablet [id number] Yes Take 1 tablet (5 mg total) by mouth 2 (two) times daily. Arnoldo Lenis, MD Taking Active   Ascorbic Acid (VITAMIN C) 500 MG CAPS [id number] Yes Take 500 mg by mouth.  [provider] Taking Active Self  azithromycin (ZITHROMAX) 250 MG tablet [id number] No Take 2 tablets (500 mg) on  Day 1,  followed by 1 tablet (250 mg) once daily on Days 2 through 5.  Patient not taking: Reported on 02/03/2021   Magda Bernheim, NP Not Taking Active            Med Note Laretta Alstrom, Ziv Welchel A   Mon Feb 03, 2021 11:17 AM) completed  CHOLECALCIFEROL PO [id number] Yes Take 5,000 Units by mouth daily. [provider] Taking Active Self           Med Note [facility], Adair Laundry   Wed Jul 31, 2020  2:18 PM)    cyanocobalamin 1000 MCG tablet [id number] Yes Take 1,000 mcg by mouth daily. [provider] Taking Active Self  cyclobenzaprine (FLEXERIL) 10 MG tablet [id number] Yes Take 1/2 to 1 tablet    2 to 3 x /day if needed for Muscle Spasms [name], MD Taking Active   dexamethasone (DECADRON) 1 MG tablet [id number] No Take 3 tabs for 3 days, 2 tabs for 3 days 1 tab for 5 days. Take with food.  Patient not taking: Reported on 02/03/2021   Magda Bernheim, NP Not Taking Active            Med Note Laretta Alstrom, Sacheen Arrasmith A   Tue Feb 04, 2021  7:43 AM) completed  diltiazem (CARDIZEM CD) 300 MG  24 hr capsule [id number] Yes Take 1 capsule (300 mg total) by mouth daily. Erma Heritage, PA-C Taking Active Self  ferrous sulfate 325 (65 FE) MG tablet [id number] Yes Take 1 tablet (325 mg total) by mouth every other day. [name], NP Taking Active   ipratropium-albuterol (DUONEB) 0.5-2.5 (3) MG/3ML SOLN [id number] Yes Take 3 mLs by nebulization every 4 (four) hours as needed. Max:6 doses per day Magda Bernheim, NP Taking Active   lansoprazole (PREVACID) 30 MG capsule [id number] Yes TAKE 1 CAPSULE BY MOUTH  DAILY FOR HEARTBURN AND  Sharene Skeans, NP  Taking Active   Magnesium 250 MG TABS [id number] Yes Take by mouth. [provider] Taking Active   metoprolol tartrate (LOPRESSOR) 25 MG tablet [id number] Yes TAKE 2 TABLETS BY MOUTH ONCE DAILY IN THE MORNING AND 1 TABLET ONCE DAILY IN THE EVENING [place], Alphonse Guild, MD Taking Active   nitroGLYCERIN (NITROSTAT) 0.4 MG SL tablet [id number] Yes Place 1 tablet (0.4 mg total) under the tongue every 5 (five) minutes as needed for chest pain. Erma Heritage, PA-C Taking Active   PARoxetine (PAXIL) 20 MG tablet [id number] Yes TAKE 1 TABLET BY MOUTH  DAILY FOR Champ Mungo, NP Taking Active   Probiotic Product (PROBIOTIC DAILY PO) [id number] Yes Take 1 tablet by mouth daily. [provider] Taking Active   promethazine-dextromethorphan (PROMETHAZINE-DM) 6.25-15 MG/5ML syrup [id number] No Take 5 mLs by mouth 4 (four) times daily as needed for cough.  Patient not taking: Reported on 02/03/2021   Magda Bernheim, NP Not Taking Active            Med Note Laretta Alstrom, Kinzleigh Kandler A   Mon Feb 03, 2021 11:24 AM) completed  psyllium (METAMUCIL) 58.6 % packet [id number] Yes Take 1 packet by mouth daily. [provider] Taking Active   zinc gluconate 50 MG tablet [id number] Yes Take 50 mg by mouth daily. [provider] Taking Active              SDOH:  (Social Determinants of Health) assessments and interventions performed: SDOH assessments completed today and documented in the Epic system.    Care Plan  Review of patient past medical history, allergies, medications, health status, including review of consultants reports, laboratory and other test data, was performed as part of comprehensive evaluation for care management services.   Care Plan : Atrial Fibrillation (Adult)  Updates made by Michiel Cowboy, RN since 02/04/2021 12:00 AM     Problem: Dysrhythmia (Atrial Fibrillation)   Priority: High     Long-Range Goal: Heart Rate and Rhythm Monitored and Managed Completed  02/04/2021  Start Date: 03/20/2020  Expected End Date: 04/14/2021  Note:   Resolving due to duplicate goal  Evidence-based guidance:  Assess heart rate, rhythm and presence of symptoms at each encounter using pulse palpation, blood pressure monitor and review of symptom diary.  Consider pulse check plus an electrocardiogram (ECG) in women over the age of 12 years.  Prepare patient for diagnostic studies, such as 24-hour ambulatory monitoring, echocardiogram or implantable loop recorder, based on presentation and risk factors, such as cryptogenic stroke.  Prepare patient for use of pharmacologic therapy, such as beta-blocker, calcium channel blocker, digoxin, antiarrhythmic, based on presentation, risk factors and patient preferences.  Monitor side effects and expect periodic adjustments.  Provide an opportunity for shared decision-making when uncontrolled rate and rhythm persist and invasive therapy is considered, such as left atrial ablation, pacemaker, cardioversion or cardiac resynchronization therapy.  Provide reassurance, as initial symptoms and potential recurrence are frightening to patient and family/caregiver and may impact quality of life.  Provide prompt follow-up after hospitalization to support transition of care; consider referral to home care or community support program, especially in presence of comorbidity.  Encourage exercise 2 to 3 times per week, based on ability and tolerance, to improve physical and cardiac function and quality of life.   Notes:     Task: Alleviate Barriers to Dysrhythmia Management Completed 02/04/2021  Due Date: 04/14/2021  Note:   Care Management Activities:    - activity or exercise based on tolerance encouraged - barriers to lifestyle changes reviewed and addressed - medication-adherence assessment completed - medication side effects monitored and managed - pulse rate and rhythm assessed - response to pharmacologic therapy monitored - screen for  functional limitations reviewed - rescue (action) plan use encouraged    Notes:     Care Plan : Wall of Care  Updates made by Michiel Cowboy, RN since 02/04/2021 12:00 AM     Problem: Knowledge Deficit Related to Atrial Fibrillation   Priority: High     Long-Range Goal: Development of Plan of Care for Management of Atrial Fibrillation   Start Date: 02/03/2021  Expected End Date: 02/12/2022  Priority: High  Note:   Current Barriers:  Knowledge Deficits related to plan of care for management of Atrial Fibrillation   RNCM Clinical Goal(s):  Patient will verbalize understanding of plan for management of Atrial Fibrillation as evidenced by monitoring pulse and B/P 2-3 times a week and when experiencing palpitations; notify provider regarding concerning symptoms   through collaboration with RN Care manager, provider, and care team.   Interventions: Inter-disciplinary care team collaboration (see longitudinal plan of care) Evaluation of current treatment plan related to  self management and patient's adherence to plan as established by provider Discussed importance of monitoring pulse and B/P 2-3 times a week and record the values and to take pulse and B/P each time patient feels atrial fibrillation symptoms Discussed contacting provider for any concerning symptoms or values Previously provided atrial fibrillation education booklet    Patient Goals/Self-Care Activities: - begin a symptom diary - bring symptom diary to all appointments - make a plan to exercise regularly - make a plan to eat healthy - take medicine as prescribed -take pulse and B/P at least 2-3 times weekly and when experiencing palpitations/heart fluttering; record the values and symptoms -contact Dr. Harl Bowie for any concerning values or symptoms        Plan: Telephone follow up appointment with care management team member scheduled for:  February. Nurse will send PCP a quarterly update.  [name] RN, [place] [phone] [email or web address]

## 2021-02-04 NOTE — Telephone Encounter (Signed)
I put in an order for [employer]

## 2021-02-04 NOTE — Patient Instructions (Addendum)
Visit Information  Thank you for taking time to visit with me today. Please don't hesitate to contact me if I can be of assistance to you before our next scheduled telephone appointment.  Following are the goals we discussed today:   Patient Goals/Self-Care Activities: - begin a symptom diary - bring symptom diary to all appointments - make a plan to exercise regularly - make a plan to eat healthy - take medicine as prescribed -take pulse and B/P at least 2-3 times weekly and when experiencing palpitations/heart fluttering; record the values and symptoms -contact Dr. Harl Bowie for any concerning values or symptoms   The patient verbalized understanding of instructions, educational materials, and care plan provided today and agreed to receive a mailed copy of patient instructions, educational materials, and care plan.   Telephone follow up appointment with care management team member scheduled [id number].   [name] RN, [place] [phone] [email or web address]

## 2021-02-05 DIAGNOSIS — J40 Bronchitis, not specified as acute or chronic: Secondary | ICD-10-CM | POA: Diagnosis not present

## 2021-03-03 DIAGNOSIS — J40 Bronchitis, not specified as acute or chronic: Secondary | ICD-10-CM | POA: Diagnosis not present

## 2021-03-07 DIAGNOSIS — J40 Bronchitis, not specified as acute or chronic: Secondary | ICD-10-CM | POA: Diagnosis not present

## 2021-03-12 ENCOUNTER — Ambulatory Visit
Admission: RE | Admit: 2021-03-12 | Discharge: 2021-03-12 | Disposition: A | Discharge status: Home / Self Care | Payer: Medicare Other | Source: Ambulatory Visit | Attending: Adult Health | Admitting: Adult Health

## 2021-03-12 ENCOUNTER — Other Ambulatory Visit: Payer: Self-pay

## 2021-03-12 DIAGNOSIS — Z1231 Encounter for screening mammogram for malignant neoplasm of breast: Secondary | ICD-10-CM

## 2021-03-21 DIAGNOSIS — G4733 Obstructive sleep apnea (adult) (pediatric): Secondary | ICD-10-CM | POA: Diagnosis not present

## 2021-03-26 ENCOUNTER — Other Ambulatory Visit: Payer: Self-pay | Admitting: Adult Health

## 2021-03-26 DIAGNOSIS — K2100 Gastro-esophageal reflux disease with esophagitis, without bleeding: Secondary | ICD-10-CM

## 2021-03-31 DIAGNOSIS — J40 Bronchitis, not specified as acute or chronic: Secondary | ICD-10-CM | POA: Diagnosis not present

## 2021-04-01 ENCOUNTER — Encounter: Payer: Self-pay | Admitting: Nurse Practitioner

## 2021-04-01 ENCOUNTER — Other Ambulatory Visit: Payer: Self-pay

## 2021-04-01 ENCOUNTER — Ambulatory Visit (INDEPENDENT_AMBULATORY_CARE_PROVIDER_SITE_OTHER): Payer: Medicare Other | Admitting: Nurse Practitioner

## 2021-04-01 VITALS — BP 124/72 | HR 94 | Temp 97.7°F

## 2021-04-01 DIAGNOSIS — J40 Bronchitis, not specified as acute or chronic: Secondary | ICD-10-CM

## 2021-04-01 DIAGNOSIS — R6889 Other general symptoms and signs: Secondary | ICD-10-CM

## 2021-04-01 DIAGNOSIS — Z1152 Encounter for screening for COVID-19: Secondary | ICD-10-CM | POA: Diagnosis not present

## 2021-04-01 LAB — POCT INFLUENZA A/B
Influenza A, POC: NEGATIVE
Influenza B, POC: NEGATIVE

## 2021-04-01 LAB — POC COVID19 BINAXNOW: SARS Coronavirus 2 Ag: NEGATIVE

## 2021-04-01 MED ORDER — PREDNISONE 20 MG PO TABS: ORAL_TABLET | ORAL | 1 refills | Status: AC

## 2021-04-01 MED ORDER — AZITHROMYCIN 250 MG PO TABS: ORAL_TABLET | ORAL | 1 refills | Status: DC

## 2021-04-01 MED ORDER — PROMETHAZINE-DM 6.25-15 MG/5ML PO SYRP: 5.0000 mL | ORAL_SOLUTION | Freq: Four times a day (QID) | ORAL | 1 refills | Status: DC | PRN

## 2021-04-01 NOTE — Progress Notes (Signed)
Assessment and Plan:  Melannie was seen today for acute visit.  Diagnoses and all orders for this visit:  Flu-like symptoms -     POCT Influenza A/B Negative  Encounter for screening for COVID-19 -     POC COVID-19 Negative  Bronchitis -     predniSONE (DELTASONE) 20 MG tablet; 3 tablets daily with food for 3 days, 2 tabs daily for 3 days, 1 tab a day for 5 days. -     promethazine-dextromethorphan (PROMETHAZINE-DM) 6.25-15 MG/5ML syrup; Take 5 mLs by mouth 4 (four) times daily as needed for cough. -     azithromycin (ZITHROMAX) 250 MG tablet; Take 2 tablets (500 mg) on  Day 1,  followed by 1 tablet (250 mg) once daily on Days 2 through 5. Use Mucinex DM as needed and Nebulizer with Duoneb q 6 hours as needed             Monitor symptoms  Go to ER if : Cough up blood. Feel pain in your chest. Have severe shortness of breath. Faint or keep feeling like you are going to faint. Have a severe headache. Have a fever or chills that get worse      Further disposition pending results of labs. Discussed med's effects and SE's.   Over 30 minutes of exam, counseling, chart review, and critical decision making was performed.   Future Appointments  Date Time Provider [place]  04/03/2021 10:30 AM [name], NP [facility] None  04/16/2021 11:00 AM Wine, Argie Ramming, RN [facility] None  05/21/2021  3:00 PM [name], NP [facility] None  07/02/2021 10:30 AM [name], MD [facility] None  10/29/2021 11:00 AM [name], MD [facility] None    ------------------------------------------------------------------------------------------------------------------   HPI BP 124/72    Pulse 94    Temp 97.7 F (36.5 C)    SpO2 94%  81 y.o.female presents for congestion,nonproductive cough, sore throat, weakness, headaches,fatigue and myalgias which have been present for 7 days.  She has been taking Duoneb twice a day and Mucinex DM but symptoms persist   Past Medical History:  Diagnosis  Date   Anxiety    Chronic lower back pain    Depression    DJD (degenerative joint disease)    knees, neck   GERD (gastroesophageal reflux disease)    has had dilitation in the past   Hypertension      Allergies  Allergen Reactions   Cephalosporins Rash    Current Outpatient Medications on File Prior to Visit  Medication Sig   acetaminophen (TYLENOL) 500 MG tablet Take 500 mg by mouth every 6 (six) hours as needed.   apixaban (ELIQUIS) 5 MG TABS tablet Take 1 tablet (5 mg total) by mouth 2 (two) times daily.   Ascorbic Acid (VITAMIN C) 500 MG CAPS Take 500 mg by mouth.    azithromycin (ZITHROMAX) 250 MG tablet Take 2 tablets (500 mg) on  Day 1,  followed by 1 tablet (250 mg) once daily on Days 2 through 5. (Patient not taking: Reported on 02/03/2021)   CHOLECALCIFEROL PO Take 5,000 Units by mouth daily.   cyanocobalamin 1000 MCG tablet Take 1,000 mcg by mouth daily.   cyclobenzaprine (FLEXERIL) 10 MG tablet Take 1/2 to 1 tablet    2 to 3 x /day if needed for Muscle Spasms   dexamethasone (DECADRON) 1 MG tablet Take 3 tabs for 3 days, 2 tabs for 3 days 1 tab for 5 days. Take with food. (Patient not taking: Reported  on 02/03/2021)   diltiazem (CARDIZEM CD) 300 MG 24 hr capsule Take 1 capsule (300 mg total) by mouth daily.   ferrous sulfate 325 (65 FE) MG tablet Take 1 tablet (325 mg total) by mouth every other day.   ipratropium-albuterol (DUONEB) 0.5-2.5 (3) MG/3ML SOLN Take 3 mLs by nebulization every 4 (four) hours as needed. Max:6 doses per day   lansoprazole (PREVACID) 30 MG capsule TAKE 1 CAPSULE BY MOUTH  DAILY FOR HEARTBURN AND  INDIGESTION   Magnesium 250 MG TABS Take by mouth.   metoprolol tartrate (LOPRESSOR) 25 MG tablet TAKE 2 TABLETS BY MOUTH ONCE DAILY IN THE MORNING AND 1 TABLET ONCE DAILY IN THE EVENING   nitroGLYCERIN (NITROSTAT) 0.4 MG SL tablet Place 1 tablet (0.4 mg total) under the tongue every 5 (five) minutes as needed for chest pain.   PARoxetine (PAXIL) 20 MG  tablet TAKE 1 TABLET BY MOUTH  DAILY FOR MOOD   Probiotic Product (PROBIOTIC DAILY PO) Take 1 tablet by mouth daily.   promethazine-dextromethorphan (PROMETHAZINE-DM) 6.25-15 MG/5ML syrup Take 5 mLs by mouth 4 (four) times daily as needed for cough. (Patient not taking: Reported on 02/03/2021)   psyllium (METAMUCIL) 58.6 % packet Take 1 packet by mouth daily.   zinc gluconate 50 MG tablet Take 50 mg by mouth daily.   No current facility-administered medications on file prior to visit.    ROS: all negative except above.   Physical Exam:  BP 124/72    Pulse 94    Temp 97.7 F (36.5 C)    SpO2 94%   General Appearance: Well nourished, in no apparent distress. Eyes: PERRLA, EOMs, conjunctiva no swelling or erythema Sinuses: No Frontal/maxillary tenderness ENT/Mouth: Ext aud canals clear, TMs - Right is erythematous, fluid noted. No erythema, swelling, or exudate on post pharynx.  Tonsils not swollen or erythematous. Hearing normal.  Neck: Supple, thyroid normal.  Respiratory: Respiratory effort normal, BS equal bilaterally without rales, rhonchi, wheezing or stridor.  Cardio: RRR with no MRGs. Brisk peripheral pulses without edema.  Abdomen: Soft, + BS.  Non tender, no guarding, rebound, hernias, masses. Lymphatics: Non tender without lymphadenopathy.  Musculoskeletal: Full ROM, 5/5 strength, normal gait.  Skin: Warm, dry without rashes, lesions, ecchymosis.  Neuro: Cranial nerves intact. Normal muscle tone, no cerebellar symptoms. Sensation intact.  Psych: Awake and oriented X 3, normal affect, Insight and Judgment appropriate.     Magda Bernheim, NP 3:39 PM [facility]

## 2021-04-01 NOTE — Patient Instructions (Signed)
Acute Bronchitis, Adult °Acute bronchitis is sudden inflammation of the main airways (bronchi) that come off the windpipe (trachea) in the lungs. The swelling causes the airways to get smaller and make more mucus than normal. This can make it hard to breathe and can cause coughing or noisy breathing (wheezing). °Acute bronchitis may last several weeks. The cough may last longer. Allergies, asthma, and exposure to smoke may make the condition worse. °What are the causes? °This condition can be caused by germs and by substances that irritate the lungs, including: °Cold and flu viruses. The most common cause of this condition is the virus that causes the common cold. °Bacteria. This is less common. °Breathing in substances that irritate the lungs, including: °Smoke from cigarettes and other forms of tobacco. °Dust and pollen. °Fumes from household cleaning products, gases, or burned fuel. °Indoor or outdoor air pollution. °What increases the risk? °The following factors may make you more likely to develop this condition: °A weak body's defense system, also called the immune system. °A condition that affects your lungs and breathing, such as asthma. °What are the signs or symptoms? °Common symptoms of this condition include: °Coughing. This may bring up clear, yellow, or green mucus from your lungs (sputum). °Wheezing. °Runny or stuffy nose. °Having too much mucus in your lungs (chest congestion). °Shortness of breath. °Aches and pains, including sore throat or chest. °How is this diagnosed? °This condition is usually diagnosed based on: °Your symptoms and medical history. °A physical exam. °You may also have other tests, including tests to rule out other conditions, such as pneumonia. These tests include: °A test of lung function. °Test of a mucus sample to look for the presence of bacteria. °Tests to check the oxygen level in your blood. °Blood tests. °Chest X-ray. °How is this treated? °Most cases of acute bronchitis  clear up over time without treatment. Your health care provider may recommend: °Drinking more fluids to help thin your mucus so it is easier to cough up. °Taking inhaled medicine (inhaler) to improve air flow in and out of your lungs. °Using a vaporizer or a humidifier. These are machines that add water to the air to help you breathe better. °Taking a medicine that thins mucus and clears congestion (expectorant). °Taking a medicine that prevents or stops coughing (cough suppressant). °It is notcommon to take an antibiotic medicine for this condition. °Follow these instructions at home: ° °Take over-the-counter and prescription medicines only as told by your health care provider. °Use an inhaler, vaporizer, or humidifier as told by your health care provider. °Take two teaspoons (10 mL) of honey at bedtime to lessen coughing at night. °Drink enough fluid to keep your urine pale yellow. °Do not use any products that contain nicotine or tobacco. These products include cigarettes, chewing tobacco, and vaping devices, such as e-cigarettes. If you need help quitting, ask your health care provider. °Get plenty of rest. °Return to your normal activities as told by your health care provider. Ask your health care provider what activities are safe for you. °Keep all follow-up visits. This is important. °How is this prevented? °To lower your risk of getting this condition again: °Wash your hands often with soap and water for at least 20 seconds. If soap and water are not available, use hand sanitizer. °Avoid contact with people who have cold symptoms. °Try not to touch your mouth, nose, or eyes with your hands. °Avoid breathing in smoke or chemical fumes. Breathing smoke or chemical fumes will make your condition   worse. °Get the flu shot every year. °Contact a health care provider if: °Your symptoms do not improve after 2 weeks. °You have trouble coughing up the mucus. °Your cough keeps you awake at night. °You have a  fever. °Get help right away if you: °Cough up blood. °Feel pain in your chest. °Have severe shortness of breath. °Faint or keep feeling like you are going to faint. °Have a severe headache. °Have a fever or chills that get worse. °These symptoms may represent a serious problem that is an emergency. Do not wait to see if the symptoms will go away. Get medical help right away. Call your local emergency services (911 in the [place] Do not drive yourself to the hospital. °Summary °Acute bronchitis is inflammation of the main airways (bronchi) that come off the windpipe (trachea) in the lungs. The swelling causes the airways to get smaller and make more mucus than normal. °Drinking more fluids can help thin your mucus so it is easier to cough up. °Take over-the-counter and prescription medicines only as told by your health care provider. °Do not use any products that contain nicotine or tobacco. These products include cigarettes, chewing tobacco, and vaping devices, such as e-cigarettes. If you need help quitting, ask your health care provider. °Contact a health care provider if your symptoms do not improve after 2 weeks. °This information is not intended to replace advice given to you by your health care provider. Make sure you discuss any questions you have with your health care provider. °Document Revised: 07/03/2020 Document Reviewed: 07/03/2020 °Elsevier Patient Education © 2022 [employer] ° °

## 2021-04-03 ENCOUNTER — Ambulatory Visit: Payer: PRIVATE HEALTH INSURANCE | Admitting: Adult Health

## 2021-04-07 DIAGNOSIS — J40 Bronchitis, not specified as acute or chronic: Secondary | ICD-10-CM | POA: Diagnosis not present

## 2021-04-16 ENCOUNTER — Other Ambulatory Visit: Payer: Self-pay | Admitting: *Deleted

## 2021-04-16 NOTE — Patient Instructions (Signed)
Visit Information  Thank you for taking time to visit with me today. Please don't hesitate to contact me if I can be of assistance to you before our next scheduled telephone appointment.  Following are the goals we discussed today:  Patient Goals/Self-Care Activities: Take medications as prescribed   Attend all scheduled provider appointments Call pharmacy for medication refills 3-7 days in advance of running out of medications Call provider office for new concerns or questions  - begin a symptom diary - bring symptom diary to all appointments - make a plan to eat healthy - take medicine as prescribed Continue to-take pulse and B/P at least 2 times weekly and when experiencing palpitations/heart fluttering; record the values and symptoms; call doctor as needed Keep your B/P monitor where you can see it to help you remember to take your pulse/B/P Eat a heart healthy low cholesterol diet Continue to stay active by 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   Telephone follow up appointment with care management team member scheduled for: April  Kostantinos Tallman Harper RN, [place] [phone] [email or web address]

## 2021-04-16 NOTE — Patient Outreach (Signed)
[facility]) Care Management  04/16/2021  Darlene [name] Feb 09, 1941 [id number]  [place] [facility]) Care Management RN Health Coach Note   04/16/2021 Name:  Darlene [name] MRN:  [id number] DOB:  28-Jun-1940  Summary: Patient reports that she had a second bout of bronchitis mid January. She explains that it took a while but currently she is feeling better. Patient continues to use her nebulizer as needed. Patient admits that she is not taking her pulse and B/P routinely. She continues to take her Eliquis and states she will try her best to take her pulse and B/P at least 2 times a week and when experiencing Afib symptoms. Patient's home environment is safe. Patient did not have any further questions or concerns today and did confirm that she has this nurse's contact number to call her if needed.   Recommendations/Changes made from today's visit: Continue to-take pulse and B/P at least 2 times weekly and when experiencing palpitations/heart fluttering; record the values and symptoms; call doctor as needed Keep your B/P monitor where you can see it to help you remember to take your pulse/B/P Eat a heart healthy low cholesterol diet Continue to stay active by assisting with caring for children 5 days a week and doing chair exercises 1-3 times a week Do not take NSAID's and seek medical attention after a head injury or if there is blood in your urine/stool (Eliquis can cause you to bleed)  Subjective: Darlene [name] is an 81 y.o. year old female who is a primary patient of [name], MD. The care management team was consulted for assistance with care management and/or care coordination needs.    RN Health Coach completed Telephone Visit today.   Objective:  Medications Reviewed Today     Reviewed by Michiel Cowboy, RN (Registered Nurse) on 04/16/21 at 32  Med List Status: <None>   Medication Order Taking? Sig Documenting Provider Last Dose Status Informant   acetaminophen (TYLENOL) 500 MG tablet [id number] Yes Take 500 mg by mouth every 6 (six) hours as needed. [provider] Taking Active Self  apixaban (ELIQUIS) 5 MG TABS tablet [id number] Yes Take 1 tablet (5 mg total) by mouth 2 (two) times daily. Arnoldo Lenis, MD Taking Active   Ascorbic Acid (VITAMIN C) 500 MG CAPS [id number] Yes Take 500 mg by mouth.  [provider] Taking Active Self  azithromycin (ZITHROMAX) 250 MG tablet [id number] No Take 2 tablets (500 mg) on  Day 1,  followed by 1 tablet (250 mg) once daily on Days 2 through 5.  Patient not taking: Reported on 04/16/2021   Magda Bernheim, NP Not Taking Active            Med Note Laretta Alstrom, Sigourney Portillo A   Wed Apr 16, 2021  3:24 PM) completed  CHOLECALCIFEROL PO [id number] Yes Take 5,000 Units by mouth daily. [provider] Taking Active Self           Med Note [facility], Adair Laundry   Wed Jul 31, 2020  2:18 PM)    cyanocobalamin 1000 MCG tablet [id number] Yes Take 1,000 mcg by mouth daily. [provider] Taking Active Self  cyclobenzaprine (FLEXERIL) 10 MG tablet [id number] Yes Take 1/2 to 1 tablet    2 to 3 x /day if needed for Muscle Spasms [name], MD Taking Active   dextromethorphan-guaiFENesin [facility] DM) 30-600 MG 12hr tablet [id number] No Take 1 tablet by mouth 2 (two) times daily.  Patient  not taking: Reported on 04/16/2021   [provider] Not Taking Active            Med Note Laretta Alstrom, Shylie Polo A   Wed Apr 16, 2021  3:24 PM) completed  diltiazem (CARDIZEM CD) 300 MG 24 hr capsule [id number] Yes Take 1 capsule (300 mg total) by mouth daily. Erma Heritage, PA-C Taking Active Self  ferrous sulfate 325 (65 FE) MG tablet [id number] Yes Take 1 tablet (325 mg total) by mouth every other day. [name], NP Taking Active   ipratropium-albuterol (DUONEB) 0.5-2.5 (3) MG/3ML SOLN [id number] Yes Take 3 mLs by nebulization every 4 (four) hours as needed. Max:6 doses per day Magda Bernheim, NP  Taking Active   lansoprazole (PREVACID) 30 MG capsule [id number] Yes TAKE 1 CAPSULE BY MOUTH  DAILY FOR HEARTBURN AND  Sharene Skeans, NP Taking Active   Magnesium 250 MG TABS [id number] Yes Take by mouth. [provider] Taking Active   metoprolol tartrate (LOPRESSOR) 25 MG tablet [id number] Yes TAKE 2 TABLETS BY MOUTH ONCE DAILY IN THE MORNING AND 1 TABLET ONCE DAILY IN THE EVENING Branch, Alphonse Guild, MD Taking Active   nitroGLYCERIN (NITROSTAT) 0.4 MG SL tablet [id number] Yes Place 1 tablet (0.4 mg total) under the tongue every 5 (five) minutes as needed for chest pain. Erma Heritage, PA-C Taking Active   PARoxetine (PAXIL) 20 MG tablet [id number] Yes TAKE 1 TABLET BY MOUTH  DAILY FOR Champ Mungo, NP Taking Active   Probiotic Product (PROBIOTIC DAILY PO) [id number] Yes Take 1 tablet by mouth daily. [provider] Taking Active   promethazine-dextromethorphan (PROMETHAZINE-DM) 6.25-15 MG/5ML syrup [id number] No Take 5 mLs by mouth 4 (four) times daily as needed for cough.  Patient not taking: Reported on 04/16/2021   Magda Bernheim, NP Not Taking Active            Med Note Laretta Alstrom, Sable Knoles A   Wed Apr 16, 2021  3:24 PM) completed  psyllium (METAMUCIL) 58.6 % packet [id number] Yes Take 1 packet by mouth daily. [provider] Taking Active   zinc gluconate 50 MG tablet [id number] Yes Take 50 mg by mouth daily. [provider] Taking Active              SDOH:  (Social Determinants of Health) assessments and interventions performed: SDOH assessments completed today and documented in the Epic system.     Care Plan  Review of patient past medical history, allergies, medications, health status, including review of consultants reports, laboratory and other test data, was performed as part of comprehensive evaluation for care management services.   Care Plan : RN Care Manager Plan of Care  Updates made by Michiel Cowboy, RN since 04/16/2021 12:00 AM      Problem: Knowledge Deficit Related to Atrial Fibrillation   Priority: High     Long-Range Goal: Development of Plan of Care for Management of Atrial Fibrillation   Start Date: 02/03/2021  Expected End Date: 02/12/2022  Priority: High  Note:   Current Barriers:  Knowledge Deficits related to plan of care for management of Atrial Fibrillation   RNCM Clinical Goal(s):  Patient will verbalize understanding of plan for management of Atrial Fibrillation as evidenced by monitoring pulse and B/P at least 2 times a week and when experiencing palpitations; notify provider regarding concerning symptoms  take all medications exactly as prescribed and will call provider for medication related questions as evidenced by verbalization from the patient they  are taking prescribed medications as written and contacting the provider for questions or concerns    continue to work with RN Care Manager and/or Social Worker to address care management and care coordination needs related to Atrial Fibrillation as evidenced by adherence to CM Team Scheduled appointments     work with pharmacist to address Financial constraints related to Eliquis related to Atrial Fibrillation as evidenced by review of EMR and patient or pharmacist report    through collaboration with Consulting civil engineer, provider, and care team.   Interventions: Inter-disciplinary care team collaboration (see longitudinal plan of care) Evaluation of current treatment plan related to  self management and patient's adherence to plan as established by provider Discussed importance of monitoring pulse and B/P 2 times a week and record the values and to take pulse and B/P each time patient feels atrial fibrillation symptoms Discussed contacting provider for any concerning symptoms or values Previously provided atrial fibrillation education booklet    AFIB Interventions: (Status:  Goal on track:  Yes.) Long Term Goal   Counseled on increased risk of  stroke due to Afib and benefits of anticoagulation for stroke prevention Reviewed importance of adherence to anticoagulant exactly as prescribed Counseled on bleeding risk associated with Eliquis and importance of self-monitoring for signs/symptoms of bleeding Counseled on avoidance of NSAIDs due to increased bleeding risk with anticoagulants Counseled on seeking medical attention after a head injury or if there is blood in the urine/stool Afib action plan reviewed  Patient Goals/Self-Care Activities: Take medications as prescribed   Attend all scheduled provider appointments Call pharmacy for medication refills 3-7 days in advance of running out of medications Call provider office for new concerns or questions  - begin a symptom diary - bring symptom diary to all appointments - make a plan to eat healthy - take medicine as prescribed Continue to -take pulse and B/P at least 2 times weekly and when experiencing palpitations/heart fluttering; record the values and symptoms; call doctor as needed Keep your B/P monitor where you can see it to help you remember to take your pulse/B/P Eat a heart healthy low cholesterol diet Continue to stay active by assisting with caring for children 5 days a week and doing chair exercises 1-3 times a week Do not take NSAIDs and seek medical attention head injury or if there is blood in your urine/stool (Eliquis can cause you to bleed)        Plan: Telephone follow up appointment with care management team member scheduled for:  April.  [name] RN, [place] [phone] [email or web address]

## 2021-04-22 ENCOUNTER — Other Ambulatory Visit: Payer: Self-pay | Admitting: Student

## 2021-04-22 NOTE — Progress Notes (Signed)
MEDICARE WELLNESS  Assessment and Plan:  Annual Medicare Wellness Visit Due annually  Health maintenance reviewed Check about shingrix Declines DEXA this year  A. Fib with RVR ([place]) Rate controlled on current agents; on elequis without bleeding concerns  Cardiology following;   Pulmonary hypertension ([place]) Normal PFTs; abnormal sleep study; now on CPAP  Central sleep apnea associated with a. Fib ([place]) Dr. Brett Fairy following; on auto CPAP with good results; endorses 100% compliance  Morbid obesity ([place]) - BMI 30+ with sleep apnea Long discussion about weight loss, diet, and exercise Recommended diet heavy in fruits and veggies and low in animal meats, cheeses, and dairy products, appropriate calorie intake Discussed appropriate weight for height  She is poorly motivated to work on weight , but agreeable to making small changes; discussed portions for cereal, do more protein/veggie leftovers, alternate with sandwich/chips, portions for ice cream  Weigh once a week and keep log Follow up at next visit  Gastroesophageal reflux disease with esophagitis Well managed on current medications Discussed diet, avoiding triggers and other lifestyle changes  Esophageal stricture Continue GERD medications; follows with GI Discussed diet, avoiding triggers and other lifestyle changes  Primary osteoarthritis involving multiple joints Primarily knees, does fairly,tylenol, flexeril Discussed topical diclofenac/aspercreme Consider gabapentin or ortho referral for injections - declines further interventions at this time   Recurrent major depressive disorder, in full remission ([place]) Discussed higher risk of interactions with paxil, suggested trial of switch to lexapro; she will consider; information given Lifestyle discussed: diet/exerise, sleep hygiene, stress management, hydration  Chronic low back pain with left-sided sciatica, unspecified back pain laterality Takes tylenol, flexeril -   No longer on oral diclofenac due to heart  History of colon polyps Repeat colonoscopy 05/2020 per GI - she declined referral, not planning to pursue further at this time Denies concerning sx  Anemia, iron deficiency Chronic recurrent for many years Stable on iron supplement; Monitor CBC, iron/ferritin  Vitamin D deficiency -     Continue supplement  Cholesterol Mild/moderate elevations; low risk history and age; currently managed by lifestyle  Continue low cholesterol diet and exercise.  Check lipid panel   Unsteady gait/physical deconditioning/increased risk injury with falls Now on elequis, very sedentary with increased falls/near falls She does not drive Discussed and receptive to home PT for balance, endurance and fall prevention - Discussed and given home exercise information, fall prevention  Orders Placed This Encounter  Procedures   CBC with Differential/Platelet   COMPLETE METABOLIC PANEL WITH GFR   Magnesium   Lipid panel   TSH   Iron, TIBC and Ferritin Panel   Ambulatory referral to [place]     Discussed med's effects and SE's. Screening labs and tests as requested with regular follow-up as recommended. Over 30 minutes of exam, counseling, chart review, and complex, high level critical decision making was performed this visit.   Future Appointments  Date Time Provider [place]  05/21/2021  3:00 PM [name], NP [facility] None  07/02/2021 10:30 AM [name], MD [facility] None  07/10/2021 11:30 AM Laretta Alstrom, Argie Ramming, RN [facility] None  10/29/2021 11:00 AM [name], MD [facility] None  07/22/2022 11:00 AM [name], NP [facility] None    Plan:   During the course of the visit the patient was educated and counseled about appropriate screening and preventive services including:   Pneumococcal vaccine  Prevnar 13 Influenza vaccine Td vaccine Screening electrocardiogram Bone densitometry screening Colorectal cancer screening Diabetes  screening Glaucoma screening Nutrition counseling  Advanced  directives: requested    HPI  81 y.o. female  presents for a wellness and follow up for has DJD (degenerative joint disease); Chronic lower back pain; Depression; GERD (gastroesophageal reflux disease); Morbid obesity ([place]) - BMI 30+ with OSA; History of colon polyps; Abnormal glucose; Hyperlipidemia, mixed; Vitamin D deficiency; Continuous leakage of urine; Esophageal stricture; Iron deficiency anemia; Atrial fibrillation with RVR ([place]); Hypoxemia requiring supplemental oxygen; LVH (left ventricular hypertrophy) due to hypertensive disease, without heart failure; Severe obstructive sleep apnea-hypopnea syndrome; Central sleep apnea associated with atrial fibrillation ([place]); Pulmonary hypertension ([place]); Physical deconditioning; and At moderate risk for fall on their problem list.   She lives with her daughter and family, they drive her when needed. Didn't want to bother getting new driver's license. She watches her 2 young grandchildren.   She was fairly sedentary at baseline, but reported since admission has done minimal walking, no energy, reports has had several near falls but no injury. Uses cane for balance. She is dependent on family for transport, would be receptive to in home PT.   She has incontinence, has tried oxybutynin and myrbetriq that did not help. Wears depends and has declined further interventions.   She has been on paxil for depression for many years and doing well, has not tried taper attempt. Previously has taken benzos, no other SSRIs.   Patient was hospitalized in Jan/Feb 2021 with new onset Afib with RVR, LVH on ECHO, EF 55-60%, now on oral diltiazem 300 mg daily, lopressor (50 mg AM, 25 mg PM), elequis 5 mg BID. Dr. Harl Bowie is following. Had low risk NM study. She did have sleep study by Dr. Brett Fairy demonstrating central sleep apnea, now on Auto CPAP and does endorse 100% compliance.   Has chronic intermittent  lower back pain, using tylenol, flexeril. Off of oral diclofenac.   BMI is Body mass index is 33.37 kg/m., she has been working on diet, eating better living with daughter.  Wt Readings from Last 3 Encounters:  04/23/21 232 lb 9.6 oz (105.5 kg)  01/27/21 236 lb 3.2 oz (107.1 kg)  12/19/20 226 lb 9.6 oz (102.8 kg)   Today their BP is BP: 114/82  She does not workout. She denies chest pain, dizziness. Does have exertional dyspnea, ongoing for many years, felt r/t deconditioning.    She is not on cholesterol medication, reports hx of myalgias with unknown medication at one point, declines medications. Her cholesterol is not at goal. The cholesterol last visit was:   Lab Results  Component Value Date   CHOL 190 10/29/2020   HDL 69 10/29/2020   LDLCALC 104 (H) 10/29/2020   TRIG 82 10/29/2020   CHOLHDL 2.8 10/29/2020    She has not been working on diet and exercise for glucose management. Last A1C in the office was:  Lab Results  Component Value Date   HGBA1C 5.7 (H) 10/29/2020    Last GFR:  Lab Results  Component Value Date   GFRNONAA >60 08/07/2020   Patient is on Vitamin D supplement.   Lab Results  Component Value Date   VD25OH 76 10/29/2020      She has long hx of iron def anemia, Had colonoscopy with polyps 05/2017, EGD 3.2019 by Dr. Fuller Plan, no bleeding or ulcers. currently normal levels and has backed off on chronic iron supplement to every other day, has been on for several years due to recurrence.   CBC Latest Ref Rng & Units 10/29/2020 08/07/2020 04/03/2020  WBC 3.8 - 10.8 Thousand/uL  6.3 6.5 6.2  Hemoglobin 11.7 - 15.5 g/dL 14.3 14.5 14.4  Hematocrit 35.0 - 45.0 % 42.2 44.4 41.9  Platelets 140 - 400 Thousand/uL 215 179 211   She takes slow release iron 65 mg every other day Lab Results  Component Value Date   IRON 98 01/15/2020   TIBC 392 01/15/2020   FERRITIN 38 01/15/2020     Current Medications:    Current Outpatient Medications (Cardiovascular):     diltiazem (CARDIZEM CD) 300 MG 24 hr capsule, Take 1 capsule by mouth once daily   metoprolol tartrate (LOPRESSOR) 25 MG tablet, TAKE 2 TABLETS BY MOUTH ONCE DAILY IN THE MORNING AND 1 TABLET ONCE DAILY IN THE EVENING   nitroGLYCERIN (NITROSTAT) 0.4 MG SL tablet, Place 1 tablet (0.4 mg total) under the tongue every 5 (five) minutes as needed for chest pain.  Current Outpatient Medications (Respiratory):    ipratropium-albuterol (DUONEB) 0.5-2.5 (3) MG/3ML SOLN, Take 3 mLs by nebulization every 4 (four) hours as needed. Max:6 doses per day   promethazine-dextromethorphan (PROMETHAZINE-DM) 6.25-15 MG/5ML syrup, Take 5 mLs by mouth 4 (four) times daily as needed for cough. (Patient not taking: Reported on 04/16/2021)  Current Outpatient Medications (Analgesics):    acetaminophen (TYLENOL) 500 MG tablet, Take 500 mg by mouth every 6 (six) hours as needed.  Current Outpatient Medications (Hematological):    apixaban (ELIQUIS) 5 MG TABS tablet, Take 1 tablet (5 mg total) by mouth 2 (two) times daily.   cyanocobalamin 1000 MCG tablet, Take 1,000 mcg by mouth daily.   ferrous sulfate 325 (65 FE) MG tablet, Take 1 tablet (325 mg total) by mouth every other day.  Current Outpatient Medications (Other):    Ascorbic Acid (VITAMIN C) 500 MG CAPS, Take 500 mg by mouth.    CHOLECALCIFEROL PO, Take 5,000 Units by mouth daily.   cyclobenzaprine (FLEXERIL) 10 MG tablet, Take 1/2 to 1 tablet    2 to 3 x /day if needed for Muscle Spasms   lansoprazole (PREVACID) 30 MG capsule, TAKE 1 CAPSULE BY MOUTH  DAILY FOR HEARTBURN AND  INDIGESTION   Magnesium 250 MG TABS, Take by mouth.   PARoxetine (PAXIL) 20 MG tablet, TAKE 1 TABLET BY MOUTH  DAILY FOR MOOD   Probiotic Product (PROBIOTIC DAILY PO), Take 1 tablet by mouth daily.   psyllium (METAMUCIL) 58.6 % packet, Take 1 packet by mouth daily.   zinc gluconate 50 MG tablet, Take 50 mg by mouth daily.  Allergies:  Allergies  Allergen Reactions   Cephalosporins Rash      Medical History:  She has DJD (degenerative joint disease); Chronic lower back pain; Depression; GERD (gastroesophageal reflux disease); Morbid obesity ([place]) - BMI 30+ with OSA; History of colon polyps; Abnormal glucose; Hyperlipidemia, mixed; Vitamin D deficiency; Continuous leakage of urine; Esophageal stricture; Iron deficiency anemia; Atrial fibrillation with RVR ([place]); Hypoxemia requiring supplemental oxygen; LVH (left ventricular hypertrophy) due to hypertensive disease, without heart failure; Severe obstructive sleep apnea-hypopnea syndrome; Central sleep apnea associated with atrial fibrillation ([place]); Pulmonary hypertension ([place]); Physical deconditioning; and At moderate risk for fall on their problem list.   Health Maintenance:   Immunization History  Administered Date(s) Administered   Fluad Quad(high Dose 65+) 12/04/2019, 12/21/2020   Influenza, High Dose Seasonal PF 01/29/2018   Influenza-Unspecified 12/09/2016, 11/28/2018   Moderna Sars-Covid-2 Vaccination 03/28/2019, 04/28/2019, 02/27/2020   Pneumococcal Conjugate-13 02/12/2016, 09/30/2017   Pneumococcal Polysaccharide-23 03/16/2006, 04/02/2008   Zoster, Live 07/31/2013   Health Maintenance  Topic Date Due  Zoster Vaccines- Shingrix (1 of 2) Never done   COVID-19 Vaccine (4 - Booster for Moderna series) 05/09/2021 (Originally 04/23/2020)   DEXA SCAN  04/23/2022 (Originally 01/13/2005)   TETANUS/TDAP  04/23/2022 (Originally 01/14/1960)   MAMMOGRAM  03/13/2023   Pneumonia Vaccine 28+ Years old  Completed   INFLUENZA VACCINE  Completed   HPV VACCINES  Aged Out   COLONOSCOPY (Pts 45-82yrs Insurance coverage will need to be confirmed)  Discontinued   Tetanus: Declines, cost  Pap: remote MGM: gets annually at [facility] DEXA: remote, normal, declines further; discussed risk of falls and fractures; she expresses understanding Colonoscopy: 05/2017, polyps  - due 05/2020, but she decided not to pursue further  CXR  03/2019 - small hiatal hernia   Last Dental Exam: Dr. Lynnette Caffey, last visit 2022, goes q12m Last Eye Exam: [facility], 2022, mild cataracts  Patient Care Team: [name], MD as PCP - General (Internal Medicine) Harl Bowie, Alphonse Guild, MD as PCP - Cardiology (Cardiology) Michiel Cowboy, RN as Equality Management  Surgical History:  She has a past surgical history that includes Abdominal hysterectomy (03/1979); Cataract extraction, bilateral (Bilateral, 2014); and Breast biopsy (Left, 1986 and 1987). Family History:  Herfamily history includes Dementia in her brother; Heart disease in her father and mother; Hypertension in her daughter, father, and mother; Tremor in her sister. Social History:  She reports that she has never smoked. She has never used smokeless tobacco. She reports current alcohol use of about 3.0 standard drinks per week. She reports that she does not use drugs.  MEDICARE WELLNESS OBJECTIVES: Physical activity: Current Exercise Habits: The patient does not participate in regular exercise at present, Exercise limited by: None identified Cardiac risk factors: Cardiac Risk Factors include: advanced age (>30men, >38 women);dyslipidemia;hypertension;obesity (BMI >30kg/m2);sedentary lifestyle Depression/mood screen:   Depression screen [facility] 2/9 02/03/2021  Decreased Interest 0  Down, Depressed, Hopeless 1  PHQ - 2 Score 1  Altered sleeping -  Tired, decreased energy -  Change in appetite -  Feeling bad or failure about yourself  -  Trouble concentrating -  Moving slowly or fidgety/restless -  Suicidal thoughts -  PHQ-9 Score -  Difficult doing work/chores -    ADLs:  In your present state of health, do you have any difficulty performing the following activities: 04/23/2021 10/28/2020  Hearing? N N  Vision? N N  Difficulty concentrating or making decisions? N N  Walking or climbing stairs? Y N  Comment uses cane -  Dressing or bathing? N N   Doing errands, shopping? N N  Comment family transports -  Conservation officer, nature and eating ? N -  Using the Toilet? N -  In the past six months, have you accidently leaked urine? N -  Do you have problems with loss of bowel control? N -  Managing your Medications? N -  Managing your Finances? N -  Housekeeping or managing your Housekeeping? N -  Some recent data might be hidden     Cognitive Testing  Alert? Yes  Normal Appearance?Yes  Oriented to person? Yes  Place? Yes   Time? Yes  Recall of three objects?  Yes  Can perform simple calculations? Yes  Displays appropriate judgment?Yes  Can read the correct time from a watch face?Yes  EOL planning: Does Patient Have a Medical Advance Directive?: Yes Type of Advance Directive: Healthcare Power of Attorney, Living will Does patient want to make changes to medical advance directive?: No - Patient declined  Copy of [place] in Chart?: No - copy requested    Review of Systems  Constitutional:  Negative for malaise/fatigue and weight loss.  HENT:  Negative for hearing loss and tinnitus.   Eyes:  Negative for blurred vision and double vision.  Respiratory:  Negative for cough, shortness of breath and wheezing.   Cardiovascular:  Negative for chest pain, palpitations, orthopnea, claudication and leg swelling.  Gastrointestinal:  Negative for abdominal pain, blood in stool, constipation, diarrhea, heartburn, melena, nausea and vomiting.  Genitourinary: Negative.   Musculoskeletal:  Positive for back pain (chronic intermittent lumbar) and falls (2 in last year, none in last 3 months, no injury, frequent near falls per patient). Negative for joint pain and myalgias.  Skin:  Negative for rash.  Neurological:  Negative for dizziness, tingling, sensory change, weakness and headaches.  Endo/Heme/Allergies:  Negative for polydipsia.  Psychiatric/Behavioral: Negative.    All other systems reviewed and are negative.   Physical  Exam: Estimated body mass index is 33.37 kg/m as calculated from the following:   Height as of 01/27/21: 5\' 10"  (1.778 m).   Weight as of this encounter: 232 lb 9.6 oz (105.5 kg). BP 114/82    Pulse 70    Temp (!) 97.5 F (36.4 C)    Wt 232 lb 9.6 oz (105.5 kg)    SpO2 95%    BMI 33.37 kg/m  General Appearance: Well nourished, in no apparent distress.  Eyes: PERRLA, EOMs, conjunctiva no swelling or erythema, normal fundi and vessels.  Sinuses: No Frontal/maxillary tenderness  ENT/Mouth: Ext aud canals clear, normal light reflex with TMs without erythema, bulging. Mask in place; oral exam deferred. Hearing normal.  Neck: Supple, thyroid normal. No bruits  Respiratory: Respiratory effort normal, BS equal bilaterally without rales, rhonchi, wheezing or stridor.  Cardio: Irregularly irregular. Brisk peripheral pulses without edema.  Chest: symmetric, with normal excursions and percussion.  Breasts: Symmetric, without lumps, nipple discharge, retractions.  Abdomen: Soft, nontender, no guarding, rebound, hernias, masses, or organomegaly.  Lymphatics: Non tender without lymphadenopathy.  Genitourinary: Defer Musculoskeletal: Full ROM all peripheral extremities, 4/5 strength, and slow steady gait and sitting to standing. Mild kyphosis.  Skin: Warm, dry without rashes, lesions, ecchymosis. Neuro: Cranial nerves intact, reflexes equal bilaterally. Normal muscle tone, no cerebellar symptoms. Sensation intact.  Psych: Awake and oriented X 3, normal affect, Insight and Judgment appropriate.    Medicare Attestation I have personally reviewed: The patient's medical and social history Their use of alcohol, tobacco or illicit drugs Their current medications and supplements The patient's functional ability including ADLs,fall risks, home safety risks, cognitive, and hearing and visual impairment Diet and physical activities Evidence for depression or mood disorders  The patient's weight, height,  BMI, and visual acuity have been recorded in the chart.  I have made referrals, counseling, and provided education to the patient based on review of the above and I have provided the patient with a written personalized care plan for preventive services.     Gorden Harms Kresha Abelson 11:18 AM [facility]

## 2021-04-22 NOTE — Telephone Encounter (Signed)
This is a [place] pt

## 2021-04-23 ENCOUNTER — Other Ambulatory Visit: Payer: Self-pay

## 2021-04-23 ENCOUNTER — Encounter: Payer: Self-pay | Admitting: Adult Health

## 2021-04-23 ENCOUNTER — Ambulatory Visit (INDEPENDENT_AMBULATORY_CARE_PROVIDER_SITE_OTHER): Payer: Medicare Other | Admitting: Adult Health

## 2021-04-23 VITALS — BP 114/82 | HR 70 | Temp 97.5°F | Wt 232.6 lb

## 2021-04-23 DIAGNOSIS — R7309 Other abnormal glucose: Secondary | ICD-10-CM | POA: Diagnosis not present

## 2021-04-23 DIAGNOSIS — I272 Pulmonary hypertension, unspecified: Secondary | ICD-10-CM

## 2021-04-23 DIAGNOSIS — R5381 Other malaise: Secondary | ICD-10-CM

## 2021-04-23 DIAGNOSIS — K2100 Gastro-esophageal reflux disease with esophagitis, without bleeding: Secondary | ICD-10-CM | POA: Diagnosis not present

## 2021-04-23 DIAGNOSIS — R0902 Hypoxemia: Secondary | ICD-10-CM | POA: Diagnosis not present

## 2021-04-23 DIAGNOSIS — Z9981 Dependence on supplemental oxygen: Secondary | ICD-10-CM

## 2021-04-23 DIAGNOSIS — F3342 Major depressive disorder, recurrent, in full remission: Secondary | ICD-10-CM

## 2021-04-23 DIAGNOSIS — M159 Polyosteoarthritis, unspecified: Secondary | ICD-10-CM | POA: Diagnosis not present

## 2021-04-23 DIAGNOSIS — I119 Hypertensive heart disease without heart failure: Secondary | ICD-10-CM | POA: Diagnosis not present

## 2021-04-23 DIAGNOSIS — G8929 Other chronic pain: Secondary | ICD-10-CM

## 2021-04-23 DIAGNOSIS — E782 Mixed hyperlipidemia: Secondary | ICD-10-CM

## 2021-04-23 DIAGNOSIS — D509 Iron deficiency anemia, unspecified: Secondary | ICD-10-CM

## 2021-04-23 DIAGNOSIS — N3945 Continuous leakage: Secondary | ICD-10-CM

## 2021-04-23 DIAGNOSIS — G4733 Obstructive sleep apnea (adult) (pediatric): Secondary | ICD-10-CM

## 2021-04-23 DIAGNOSIS — Z9181 History of falling: Secondary | ICD-10-CM

## 2021-04-23 DIAGNOSIS — G4737 Central sleep apnea in conditions classified elsewhere: Secondary | ICD-10-CM

## 2021-04-23 DIAGNOSIS — E559 Vitamin D deficiency, unspecified: Secondary | ICD-10-CM

## 2021-04-23 DIAGNOSIS — Z0000 Encounter for general adult medical examination without abnormal findings: Secondary | ICD-10-CM

## 2021-04-23 DIAGNOSIS — K222 Esophageal obstruction: Secondary | ICD-10-CM

## 2021-04-23 DIAGNOSIS — Z0001 Encounter for general adult medical examination with abnormal findings: Secondary | ICD-10-CM | POA: Diagnosis not present

## 2021-04-23 DIAGNOSIS — M5442 Lumbago with sciatica, left side: Secondary | ICD-10-CM

## 2021-04-23 DIAGNOSIS — R6889 Other general symptoms and signs: Secondary | ICD-10-CM

## 2021-04-23 DIAGNOSIS — I4891 Unspecified atrial fibrillation: Secondary | ICD-10-CM | POA: Diagnosis not present

## 2021-04-23 DIAGNOSIS — Z86010 Personal history of colonic polyps: Secondary | ICD-10-CM

## 2021-04-23 NOTE — Patient Instructions (Addendum)
[name] , Thank you for taking time to come for your Medicare Wellness Visit. I appreciate your ongoing commitment to your health goals. Please review the following plan we discussed and let me know if I can assist you in the future.   These are the goals we discussed:  Goals      DIET - INCREASE WATER INTAKE     DIET - REDUCE FAST FOOD INTAKE     DIET - REDUCE PORTION SIZE     Exercise 3x per week (30 min per time)        This is a list of the screening recommended for you and due dates:  Health Maintenance  Topic Date Due   Zoster (Shingles) Vaccine (1 of 2) Never done   COVID-19 Vaccine (4 - Booster for Moderna series) 05/09/2021*   DEXA scan (bone density measurement)  04/23/2022*   Tetanus Vaccine  04/23/2022*   Mammogram  03/13/2023   Pneumonia Vaccine  Completed   Flu Shot  Completed   HPV Vaccine  Aged Out   Colon Cancer Screening  Discontinued  *Topic was postponed. The date shown is not the original due date.    Paxil may interact with lots of meds - a switch to lexapro/escitalopram would be    Escitalopram Tablets What is this medication? ESCITALOPRAM (es sye TAL oh pram) treats depression and anxiety. It increases the amount of serotonin in the brain, a hormone that helps regulate mood. It belongs to a group of medications called SSRIs. This medicine may be used for other purposes; ask your health care provider or pharmacist if you have questions. COMMON BRAND NAME(S): Lexapro What should I tell my care team before I take this medication? They need to know if you have any of these conditions: Bipolar disorder or a family history of bipolar disorder Diabetes Glaucoma Heart disease Kidney or liver disease Receiving electroconvulsive therapy Seizures Suicidal thoughts, plans, or attempt by you or a family member An unusual or allergic reaction to escitalopram, the related medication citalopram, other medications, foods, dyes, or preservatives Pregnant or  trying to become pregnant Breast-feeding How should I use this medication? Take this medication by mouth with a glass of water. Follow the directions on the prescription label. You can take it with or without food. If it upsets your stomach, take it with food. Take your medication at regular intervals. Do not take it more often than directed. Do not stop taking this medication suddenly except upon the advice of your care team. Stopping this medication too quickly may cause serious side effects or your condition may worsen. A special MedGuide will be given to you by the pharmacist with each prescription and refill. Be sure to read this information carefully each time. Talk to your care team regarding the use of this medication in children. Special care may be needed. Overdosage: If you think you have taken too much of this medicine contact a poison control center or emergency room at once. NOTE: This medicine is only for you. Do not share this medicine with others. What if I miss a dose? If you miss a dose, take it as soon as you can. If it is almost time for your next dose, take only that dose. Do not take double or extra doses. What may interact with this medication? Do not take this medication with any of the following: Certain medications for fungal infections like fluconazole, itraconazole, ketoconazole, posaconazole, voriconazole Cisapride Citalopram Dronedarone Linezolid MAOIs like Carbex, Eldepryl,  Marplan, Nardil, and Parnate Methylene blue (injected into a vein) Pimozide Thioridazine This medication may also interact with the following: Alcohol Amphetamines Aspirin and aspirin-like medications Carbamazepine Certain medications for depression, anxiety, or psychotic disturbances Certain medications for migraine headache like almotriptan, eletriptan, frovatriptan, naratriptan, rizatriptan, sumatriptan, zolmitriptan Certain medications for sleep Certain medications that treat or  prevent blood clots like warfarin, enoxaparin, dalteparin Cimetidine Diuretics Dofetilide Fentanyl Furazolidone Isoniazid Lithium Metoprolol NSAIDs, medications for pain and inflammation, like ibuprofen or naproxen Other medications that prolong the QT interval (cause an abnormal heart rhythm) Procarbazine Rasagiline Supplements like St. John's wort, kava kava, valerian Tramadol Tryptophan Ziprasidone This list may not describe all possible interactions. Give your health care provider a list of all the medicines, herbs, non-prescription drugs, or dietary supplements you use. Also tell them if you smoke, drink alcohol, or use illegal drugs. Some items may interact with your medicine. What should I watch for while using this medication? Tell your care team if your symptoms do not get better or if they get worse. Visit your care team for regular checks on your progress. Because it may take several weeks to see the full effects of this medication, it is important to continue your treatment as prescribed by your care team. Watch for new or worsening thoughts of suicide or depression. This includes sudden changes in mood, behaviors, or thoughts. These changes can happen at any time but are more common in the beginning of treatment or after a change in dose. Call your care team right away if you experience these thoughts or worsening depression. Manic episodes may happen in patients with bipolar disorder who take this medication. Watch for changes in feelings or behaviors such as feeling anxious, nervous, agitated, panicky, irritable, hostile, aggressive, impulsive, severely restless, overly excited and hyperactive, or trouble sleeping. These symptoms can happen at any time but are more common in the beginning of treatment or after a change in dose. Call your care team right away if you notice any of these symptoms. You may get drowsy or dizzy. Do not drive, use machinery, or do anything that needs  mental alertness until you know how this medication affects you. Do not stand or sit up quickly, especially if you are an older patient. This reduces the risk of dizzy or fainting spells. Alcohol may interfere with the effect of this medication. Avoid alcoholic drinks. Your mouth may get dry. Chewing sugarless gum or sucking hard candy, and drinking plenty of water may help. Contact your care team if the problem does not go away or is severe. What side effects may I notice from receiving this medication? Side effects that you should report to your care team as soon as possible: Allergic reactions--skin rash, itching, hives, swelling of the face, lips, tongue, or throat Bleeding--bloody or black, tar-like stools, red or dark brown urine, vomiting blood or brown material that looks like coffee grounds, small, red or purple spots on skin, unusual bleeding or bruising Heart rhythm changes--fast or irregular heartbeat, dizziness, feeling faint or lightheaded, chest pain, trouble breathing Low sodium level--muscle weakness, fatigue, dizziness, headache, confusion Serotonin syndrome--irritability, confusion, fast or irregular heartbeat, muscle stiffness, twitching muscles, sweating, high fever, seizure, chills, vomiting, diarrhea Sudden eye pain or change in vision such as blurry vision, seeing halos around lights, vision loss Thoughts of suicide or self-harm, worsening mood, feelings of depression Side effects that usually do not require medical attention (report to your care team if they continue or are bothersome): Change in sex  drive or performance Diarrhea Excessive sweating Nausea Tremors or shaking Upset stomach This list may not describe all possible side effects. Call your doctor for medical advice about side effects. You may report side effects to FDA at 1-800-FDA-1088. Where should I keep my medication? Keep out of reach of children and pets. Store at room temperature between 15 and 30  degrees C (59 and 86 degrees F). Throw away any unused medication after the expiration date. NOTE: This sheet is a summary. It may not cover all possible information. If you have questions about this medicine, talk to your doctor, pharmacist, or health care provider.  2022 Elsevier/Gold Standard (2020-02-19 00:00:00)

## 2021-04-24 LAB — IRON,TIBC AND FERRITIN PANEL
%SAT: 53 % (calc) — ABNORMAL HIGH (ref 16–45)
Ferritin: 62 ng/mL (ref 16–288)
Iron: 169 ug/dL — ABNORMAL HIGH (ref 45–160)
TIBC: 320 mcg/dL (calc) (ref 250–450)

## 2021-04-24 LAB — CBC WITH DIFFERENTIAL/PLATELET
Absolute Monocytes: 485 cells/uL (ref 200–950)
Basophils Absolute: 29 cells/uL (ref 0–200)
Basophils Relative: 0.5 %
Eosinophils Absolute: 80 cells/uL (ref 15–500)
Eosinophils Relative: 1.4 %
HCT: 41.9 % (ref 35.0–45.0)
Hemoglobin: 14.2 g/dL (ref 11.7–15.5)
Lymphs Abs: 1317 cells/uL (ref 850–3900)
MCH: 32.4 pg (ref 27.0–33.0)
MCHC: 33.9 g/dL (ref 32.0–36.0)
MCV: 95.7 fL (ref 80.0–100.0)
MPV: 11.8 fL (ref 7.5–12.5)
Monocytes Relative: 8.5 %
Neutro Abs: 3791 cells/uL (ref 1500–7800)
Neutrophils Relative %: 66.5 %
Platelets: 206 10*3/uL (ref 140–400)
RBC: 4.38 10*6/uL (ref 3.80–5.10)
RDW: 12.4 % (ref 11.0–15.0)
Total Lymphocyte: 23.1 %
WBC: 5.7 10*3/uL (ref 3.8–10.8)

## 2021-04-24 LAB — COMPLETE METABOLIC PANEL WITH GFR
AG Ratio: 1.8 (calc) (ref 1.0–2.5)
ALT: 12 U/L (ref 6–29)
AST: 15 U/L (ref 10–35)
Albumin: 3.6 g/dL (ref 3.6–5.1)
Alkaline phosphatase (APISO): 53 U/L (ref 37–153)
BUN: 16 mg/dL (ref 7–25)
CO2: 28 mmol/L (ref 20–32)
Calcium: 9.5 mg/dL (ref 8.6–10.4)
Chloride: 105 mmol/L (ref 98–110)
Creat: 0.89 mg/dL (ref 0.60–0.95)
Globulin: 2 g/dL (calc) (ref 1.9–3.7)
Glucose, Bld: 111 mg/dL — ABNORMAL HIGH (ref 65–99)
Potassium: 4.3 mmol/L (ref 3.5–5.3)
Sodium: 141 mmol/L (ref 135–146)
Total Bilirubin: 0.9 mg/dL (ref 0.2–1.2)
Total Protein: 5.6 g/dL — ABNORMAL LOW (ref 6.1–8.1)
eGFR: 65 mL/min/{1.73_m2} (ref >=60)

## 2021-04-24 LAB — LIPID PANEL
Cholesterol: 208 mg/dL — ABNORMAL HIGH (ref <200)
HDL: 63 mg/dL (ref >=50)
LDL Cholesterol (Calc): 124 mg/dL (calc) — ABNORMAL HIGH
Non-HDL Cholesterol (Calc): 145 mg/dL (calc) — ABNORMAL HIGH (ref <130)
Total CHOL/HDL Ratio: 3.3 (calc) (ref <5.0)
Triglycerides: 100 mg/dL (ref <150)

## 2021-04-24 LAB — MAGNESIUM: Magnesium: 1.8 mg/dL (ref 1.5–2.5)

## 2021-04-24 LAB — TSH: TSH: 2.11 mIU/L (ref 0.40–4.50)

## 2021-05-08 DIAGNOSIS — J40 Bronchitis, not specified as acute or chronic: Secondary | ICD-10-CM | POA: Diagnosis not present

## 2021-05-12 DIAGNOSIS — H26493 Other secondary cataract, bilateral: Secondary | ICD-10-CM | POA: Diagnosis not present

## 2021-05-12 DIAGNOSIS — H353132 Nonexudative age-related macular degeneration, bilateral, intermediate dry stage: Secondary | ICD-10-CM | POA: Diagnosis not present

## 2021-05-12 DIAGNOSIS — H43813 Vitreous degeneration, bilateral: Secondary | ICD-10-CM | POA: Diagnosis not present

## 2021-05-12 DIAGNOSIS — G43109 Migraine with aura, not intractable, without status migrainosus: Secondary | ICD-10-CM | POA: Diagnosis not present

## 2021-05-21 ENCOUNTER — Ambulatory Visit: Payer: Medicare Other | Admitting: Family Medicine

## 2021-06-05 DIAGNOSIS — J40 Bronchitis, not specified as acute or chronic: Secondary | ICD-10-CM | POA: Diagnosis not present

## 2021-06-17 ENCOUNTER — Other Ambulatory Visit: Payer: Self-pay | Admitting: *Deleted

## 2021-06-17 MED ORDER — APIXABAN 5 MG PO TABS: 5.0000 mg | ORAL_TABLET | Freq: Two times a day (BID) | ORAL | 3 refills | Status: DC

## 2021-06-17 NOTE — Telephone Encounter (Signed)
Prescription refill request for Eliquis received. ?Indication:  Atrial Fib ?Last office visit: 01/27/21  Zandra Abts MD ?Scr: 0.89 on 04/23/21 ?Age: 81 ?Weight: 107.1kg ? ?Based on above findings Eliquis '5mg'$  twice daily is the appropriate dose.  Refill approved. ? ?

## 2021-06-23 DIAGNOSIS — G4733 Obstructive sleep apnea (adult) (pediatric): Secondary | ICD-10-CM | POA: Diagnosis not present

## 2021-06-23 DIAGNOSIS — J40 Bronchitis, not specified as acute or chronic: Secondary | ICD-10-CM | POA: Diagnosis not present

## 2021-07-02 ENCOUNTER — Ambulatory Visit: Payer: Medicare Other | Admitting: Internal Medicine

## 2021-07-06 DIAGNOSIS — J40 Bronchitis, not specified as acute or chronic: Secondary | ICD-10-CM | POA: Diagnosis not present

## 2021-07-10 ENCOUNTER — Other Ambulatory Visit: Payer: Self-pay | Admitting: Cardiology

## 2021-07-10 ENCOUNTER — Other Ambulatory Visit: Payer: Self-pay | Admitting: *Deleted

## 2021-07-10 NOTE — Patient Outreach (Signed)
[facility]) Care Management ? ?07/10/2021 ? ?Lanier Prude ?05-Sep-1940 ?[id number] ? ?Unsuccessful outreach attempt made to patient. Patient answered the phone and stated that she would not be able to speak today. Patient did request that this nurse call back at a later date.  ? ?Plan: ?RN Health Coach will call patient within the month of May. ? ?[name] RN, BSN ?[facility] Care Management  ?RN Health Coach ?[phone] ?[email or web address] ? ? ?

## 2021-07-17 ENCOUNTER — Other Ambulatory Visit: Payer: Self-pay | Admitting: *Deleted

## 2021-07-17 NOTE — Patient Outreach (Signed)
[facility]) Care Management ? ?07/17/2021 ? ?Lanier Prude ?10/30/1940 ?[id number] ? ?[facility]) Care Management ?RN Health Coach Note ? ? ?07/17/2021 ?Name:  Darlene [name] MRN:  [id number] DOB:  [birth year] ? ?Summary: ?Patient states she is feeling well. She is now wearing a watch that reads her pulse and  B/P. Her pulse ranges last week were 70-88 and last B/P was 116/79. Nurse discussed that the watch is not as accurate as her B/P monitor and encouraged patient to use the monitor as well especially if she experiences Afib symptoms; patient verbalized understanding. Patient states she is eating healthier, has increased her water intake, and tries not to sit for long periods. She continues to be well supported by her daughter. Patient did not have any further questions or concerns today and did confirm that she has this nurse's contact number to call her if needed.  ? ?Recommendations/Changes made from today's visit: ?Continue to-take pulse and B/P at least 2 times a week and when experiencing palpitations/heart fluttering; record the values and symptoms; call doctor as needed ?Keep your B/P monitor where you can see it to help you remember to take your pulse/B/P; your watch is not as accurate ?Continue to stay active by assisting with caring for grand-children and doing getting up every hour to walk around your home ? ?Subjective: ?Darlene [name] is an 81 y.o. year old female who is a primary patient of [name], MD. The care management team was consulted for assistance with care management and/or care coordination needs.   ? ?RN Health Coach completed Telephone Visit today.  ? ?Objective: ? ?Medications Reviewed Today   ? ? Reviewed by Michiel Cowboy, RN (Registered Nurse) on 07/17/21 at 1124  Med List Status: <None>  ? ?Medication Order Taking? Sig Documenting Provider Last Dose Status Informant  ?acetaminophen (TYLENOL) 500 MG tablet [id number] Yes Take 500 mg by mouth  every 6 (six) hours as needed. [provider] Taking Active Self  ?apixaban (ELIQUIS) 5 MG TABS tablet [id number] Yes Take 1 tablet (5 mg total) by mouth 2 (two) times daily. Arnoldo Lenis, MD Taking Active   ?Ascorbic Acid (VITAMIN C) 500 MG CAPS [id number] Yes Take 500 mg by mouth.  [provider] Taking Active Self  ?CHOLECALCIFEROL PO [id number] Yes Take 5,000 Units by mouth daily. [provider] Taking Active Self  ?         ?Med Note [facility], CARLOS A   Wed Jul 31, 2020  2:18 PM)    ?cyanocobalamin 1000 MCG tablet [id number] Yes Take 1,000 mcg by mouth daily. [provider] Taking Active Self  ?cyclobenzaprine (FLEXERIL) 10 MG tablet [id number] Yes Take 1/2 to 1 tablet    2 to 3 x /day if needed for Muscle Spasms [name], MD Taking Active   ?diltiazem (CARDIZEM CD) 300 MG 24 hr capsule [id number] Yes Take 1 capsule by mouth once daily Arnoldo Lenis, MD Taking Active   ?ferrous sulfate 325 (65 FE) MG tablet [id number] Yes Take 1 tablet (325 mg total) by mouth every other day. [name], NP Taking Active   ?ipratropium-albuterol (DUONEB) 0.5-2.5 (3) MG/3ML SOLN [id number] Yes Take 3 mLs by nebulization every 4 (four) hours as needed. Max:6 doses per day Magda Bernheim, NP Taking Active   ?lansoprazole (PREVACID) 30 MG capsule [id number] Yes TAKE 1 CAPSULE BY MOUTH  DAILY FOR HEARTBURN AND  INDIGESTION [name], NP Taking Active   ?Magnesium  250 MG TABS [id number] Yes Take by mouth. [provider] Taking Active   ?metoprolol tartrate (LOPRESSOR) 25 MG tablet [id number] Yes TAKE 2 TABLETS BY MOUTH IN  THE MORNING AND 1 TABLET IN THE EVENING Branch, Alphonse Guild, MD Taking Active   ?nitroGLYCERIN (NITROSTAT) 0.4 MG SL tablet [id number] Yes Place 1 tablet (0.4 mg total) under the tongue every 5 (five) minutes as needed for chest pain. Erma Heritage, PA-C Taking Active   ?PARoxetine (PAXIL) 20 MG tablet [id number] Yes TAKE 1 TABLET BY  MOUTH  DAILY FOR MOOD [name], NP Taking Active   ?Probiotic Product (PROBIOTIC DAILY PO) [id number] Yes Take 1 tablet by mouth daily. [provider] Taking Active   ?psyllium (METAMUCIL) 58.6 % packet [id number] Yes Take 1 packet by mouth daily. [provider] Taking Active   ?zinc gluconate 50 MG tablet [id number] Yes Take 50 mg by mouth daily. [provider] Taking Active   ? ?  ?  ? ?  ? ? ? ?SDOH:  (Social Determinants of Health) assessments and interventions performed:  ? ? ?Care Plan ? ?Review of patient past medical history, allergies, medications, health status, including review of consultants reports, laboratory and other test data, was performed as part of comprehensive evaluation for care management services.  ? ?Care Plan : RN Care Manager Plan of Care  ?Updates made by Michiel Cowboy, RN since 07/17/2021 12:00 AM  ?  ? ?Problem: Knowledge Deficit Related to Atrial Fibrillation   ?Priority: High  ?  ? ?Long-Range Goal: Development of Plan of Care for Management of Atrial Fibrillation   ?Start Date: 02/03/2021  ?Expected End Date: 02/12/2022  ?Priority: High  ?Note:   ?Current Barriers:  ?Knowledge Deficits related to plan of care for management of Atrial Fibrillation  ? ?RNCM Clinical Goal(s):  ?Patient will verbalize understanding of plan for management of Atrial Fibrillation as evidenced by monitoring pulse and B/P at least 2 times a week and when experiencing palpitations; notify provider regarding concerning symptoms  ?take all medications exactly as prescribed and will call provider for medication related questions as evidenced by verbalization from the patient they are taking prescribed medications as written and contacting the provider for questions or concerns    ?continue to work with RN Care Manager and/or Social Worker to address care management and care coordination needs related to Atrial Fibrillation as evidenced by adherence to CM Team Scheduled appointments      ?work with pharmacist to address Financial constraints related to Eliquis related to Atrial Fibrillation as evidenced by review of EMR and patient or pharmacist report    through collaboration with RN Care manager, provider, and care team. Pharmacy referral completed and will assists patient. ? ?Interventions: ?Inter-disciplinary care team collaboration (see longitudinal plan of care) ?Evaluation of current treatment plan related to  self management and patient's adherence to plan as established by provider ?Discussed importance of monitoring pulse and B/P 2 times a week and record the values and to take pulse and B/P each time patient feels atrial fibrillation symptoms ?Discussed contacting provider for any concerning symptoms or values ?Previously provided atrial fibrillation education booklet  ? ? ?AFIB Interventions: (Status:  Goal on track:  Yes.) Long Term Goal ?  Counseled on increased risk of stroke due to Afib and benefits of anticoagulation for stroke prevention ?Reviewed importance of adherence to anticoagulant exactly as prescribed ?Counseled on bleeding risk associated with Eliquis and importance of self-monitoring for signs/symptoms of bleeding ?Counseled on  avoidance of NSAIDs due to increased bleeding risk with anticoagulants ?Counseled on seeking medical attention after a head injury or if there is blood in the urine/stool ?Afib action plan reviewed ? ?Patient Goals/Self-Care Activities: ?Take medications as prescribed   ?Attend all scheduled provider appointments ?Call pharmacy for medication refills 3-7 days in advance of running out of medications ?Call provider office for new concerns or questions  ?- begin a symptom diary ?- bring symptom diary to all appointments ?- make a plan to eat healthy ?- take medicine as prescribed ?Continue to -take pulse and B/P at least 2 times a week and when experiencing palpitations/heart fluttering; record the values and symptoms; call doctor as needed ?Keep  your B/P monitor where you can see it to help you remember to take your pulse/B/P; your watch is not as accurate ?Eat a heart healthy low cholesterol diet ?Continue to stay active by assisting with caring

## 2021-07-17 NOTE — Patient Instructions (Addendum)
Visit Information ? ?Thank you for taking time to visit with me today. Please don't hesitate to contact me if I can be of assistance to you before our next scheduled telephone appointment. ? ?Following are the goals we discussed today:  ? ?Take medications as prescribed   ?Attend all scheduled provider appointments ?Call pharmacy for medication refills 3-7 days in advance of running out of medications ?Call provider office for new concerns or questions  ?- begin a symptom diary ?- bring symptom diary to all appointments ?- make a plan to eat healthy ?- take medicine as prescribed ?Continue to-take pulse and B/P at least 2 times a week and when experiencing palpitations/heart fluttering; record the values and symptoms; call doctor as needed ?Keep your B/P monitor where you can see it to help you remember to take your pulse/B/P; your watch is not as accurate ?Eat a heart healthy low cholesterol diet ?Continue to stay active by assisting with caring for grand-children and doing getting up every hour to walk around your home ?Do not take NSAIDs (Ibuprofen, Advil, Motrin, Aleve, Naproxen) and seek medical attention after a head injury or if there is blood in your urine/stool (Eliquis can cause you to bleed) ? ? ?The patient verbalized understanding of instructions, educational materials, and care plan provided today and agreed to receive a mailed copy of patient instructions, educational materials, and care plan.  ? ?Telephone follow up appointment with care management team member scheduled for: July ? ?[name] RN, BSN ?Nurse Health Coach ?[place] ?[phone] ?[email or web address] ? ? ?  ?

## 2021-07-23 ENCOUNTER — Other Ambulatory Visit: Payer: Self-pay | Admitting: Student

## 2021-07-23 ENCOUNTER — Other Ambulatory Visit: Payer: Self-pay | Admitting: Cardiology

## 2021-07-27 NOTE — Progress Notes (Signed)
? ?Future Appointments  ?Date Time Provider Department  ?07/28/2021  2:30 PM [name], MD [facility]  ?09/10/2021  2:30 PM [name], NP [facility]  ?09/23/2021 10:30 AM Wine, Argie Ramming, RN [facility]  ?10/29/2021          CPE  11:00 AM [name], MD [facility]  ?07/22/2022          Wellness 11:00 AM [name], NP [facility]  ? ? ?History of Present Illness: ? ? ?    This very nice 81 y.o. WWF presents for follow up with labile  HTN,  ASHD/ pAfib, Pulm HTN, OSA, HLD, Prediabetes  and Vitamin D Deficiency. ? ? ?    Patient is treated for HTN  since  & BP has been controlled at home. Today?s BP is at goal -  110/78. Patient has had no complaints of any cardiac type chest pain, palpitations, dyspnea / orthopnea / PND, dizziness, claudication, or dependent edema. Patient has OSAon CPAP followed by Dr  ? ? ?    Hyperlipidemia is controlled with diet & meds. Patient denies myalgias or other med SE?s. Last Lipids were not at goal : ? ?Lab Results  ?Component Value Date  ? CHOL 208 (H) 04/23/2021  ? HDL 63 04/23/2021  ? LDLCALC 124 (H) 04/23/2021  ? TRIG 100 04/23/2021  ? CHOLHDL 3.3 04/23/2021  ? ? ? ?Also, the patient has history of PreDiabetes and has had no symptoms of reactive hypoglycemia, diabetic polys, paresthesias or visual blurring.  Last A1c was near goal : ? ?Lab Results  ?Component Value Date  ? HGBA1C 5.7 (H) 10/29/2020  ? ?    ? ?                                                 Further, the patient also has history of Vitamin D Deficiency and supplements vitamin D . Last vitamin D was at goal : ? ?Lab Results  ?Component Value Date  ? VD25OH 76 10/29/2020  ?   ? ?  acetaminophen 500 MG  Take every 6 (six) hours as needed., Disp: , Rfl:  ?  apixaban (ELIQUIS) 5 MG , Take 1 tablet  2 (two) times daily., Disp: 180 tablet, Rf: 3 ?  VITAMIN C  500 MG CAPS, Take dai;y. , Disp: , Rfl:  ?  Vitamin D 5,000 Units , Take  daily., Disp: , Rfl:  ?  cyanocobalamin 1000 MCG tablet, Take 1,000 mcg daily., Disp: ,  Rfl:  ?  cyclobenzaprine 10 MG tablet, Take 1/2 to 1 tablet    2 to 3 x /day if needed for Muscle Spasms, Disp: 90 tablet, Rfl: 0 ?  diltiazem  CD 300 MG Take 1 capsule  daily, Disp: 90 capsule, Rfl: 0 ?  ferrous sulfate 325 (65 FE) MG tablet, Take 1 tablet every other day., Disp: , Rfl:  ?  DUONEB 0.5-2.5 (3) MG/3ML SOLN, Take 3 mLs by nebulization every 4 (four) hours as needed. Max:6 doses per day, Disp: 540 mL, Rfl: 0 ?  lansoprazole 30 MG capsule, TAKE 1 CAPSULE   DAILY FOR  ?  Magnesium 250 MG TABS, Take , Disp: , Rfl:  ?  metoprolol tartrate (LOPRESSOR) 25 MG tablet, TAKE 2 TABLETS  IN  THE MORNING AND 1 TABLET IN THE EVENING, Disp: 270 tablet, Rfl: 3 ?  NITROSTAT 0.4 MG SL tablet, Place 1 tablet (0.4 mg total) under the tongue every 5 (five) minutes as needed for chest pain., Disp: 25 tablet, Rfl: 3 ?  PARoxetine (PAXIL) 20 MG tablet, TAKE 1 TABLET  DAILY FOR MOOD, Disp: 90 tablet, Rfl: 3 ?  Probiotic , Take 1 tablet  daily., Disp: , Rfl:  ?  psyllium (METAMUCIL) 58.6 % packet, Take 1 packet by mouth daily., Disp: , Rfl:  ?  zinc gluconate 50 MG tablet, Take 50 mg by mouth daily., Disp: , Rfl:  ? ? ?Allergies  ?Allergen Reactions  ? Cephalosporins Rash  ? ? ? ?PMHx:   ?Past Medical History:  ?Diagnosis Date  ? Anxiety   ? Chronic lower back pain   ? Depression   ? DJD (degenerative joint disease)   ? knees, neck  ? GERD (gastroesophageal reflux disease)   ? has had dilitation in the past  ? Hypertension   ? ? ? ?Immunization History  ?Administered Date(s) Administered  ? Fluad Quad(high Dose ) 12/04/2019, 12/21/2020  ? Influenza, High Dose  01/29/2018  ? Influenza 12/09/2016, 11/28/2018  ? Moderna Sars-Covid-2 Vacc 03/28/2019, 04/28/2019, 02/27/2020  ? Pneumococcal -13 02/12/2016, 09/30/2017  ? Pneumococcal -23 03/16/2006, 04/02/2008  ? Zoster, Live 07/31/2013  ? ? ? ?Past Surgical History:  ?Procedure Laterality Date  ? ABDOMINAL HYSTERECTOMY  03/1979  ? BREAST BIOPSY Left 1986 and 1987  ? CATARACT  EXTRACTION, BILATERAL Bilateral 2014  ? Dr. Madelin Headings, in [place]  ? ? ?FHx:    Reviewed / unchanged ? ?SHx:    Reviewed / unchanged  ? ?Systems Review: ? ?Constitutional: Denies fever, chills, wt changes, headaches, insomnia, fatigue, night sweats, change in appetite. ?Eyes: Denies redness, blurred vision, diplopia, discharge, itchy, watery eyes.  ?ENT: Denies discharge, congestion, post nasal drip, epistaxis, sore throat, earache, hearing loss, dental pain, tinnitus, vertigo, sinus pain, snoring.  ?CV: Denies chest pain, palpitations, irregular heartbeat, syncope, dyspnea, diaphoresis, orthopnea, PND, claudication or edema. ?Respiratory: denies cough, dyspnea, DOE, pleurisy, hoarseness, laryngitis, wheezing.  ?Gastrointestinal: Denies dysphagia, odynophagia, heartburn, reflux, water brash, abdominal pain or cramps, nausea, vomiting, bloating, diarrhea, constipation, hematemesis, melena, hematochezia  or hemorrhoids. ?Genitourinary: Denies dysuria, frequency, urgency, nocturia, hesitancy, discharge, hematuria or flank pain. ?Musculoskeletal: Denies arthralgias, myalgias, stiffness, jt. swelling, pain, limping or strain/sprain.  ?Skin: Denies pruritus, rash, hives, warts, acne, eczema or change in skin lesion(s). ?Neuro: No weakness, tremor, incoordination, spasms, paresthesia or pain. ?Psychiatric: Denies confusion, memory loss or sensory loss. ?Endo: Denies change in weight, skin or hair change.  ?Heme/Lymph: No excessive bleeding, bruising or enlarged lymph nodes. ? ?Physical Exam ? ?BP 110/78   Pulse 85   Temp 97.9 ?F (36.6 ?C)   Ht '5\' 10"'$  (1.778 m)   Wt 235 lb 12.8 oz (107 kg)   SpO2 97%   BMI 33.83 kg/m?  ? ?Appears  well nourished, well groomed  and in no distress. ? ?Eyes: PERRLA, EOMs, conjunctiva no swelling or erythema. ?Sinuses: No frontal/maxillary tenderness ?ENT/Mouth: EAC's clear, TM's nl w/o erythema, bulging. Nares clear w/o erythema, swelling, exudates. Oropharynx clear without erythema or  exudates. Oral hygiene is good. Tongue normal, non obstructing. Hearing intact.  ?Neck: Supple. Thyroid not palpable. Car 2+/2+ without bruits, nodes or JVD. ?Chest: Respirations nl with BS clear & equal w/o rales, rhonchi, wheezing or stridor.  ?Cor: Heart sounds normal w/ regular rate and rhythm without sig. murmurs, gallops, clicks or rubs. Peripheral pulses normal and equal  without edema.  ?Abdomen:  Soft & bowel sounds normal. Non-tender w/o guarding, rebound, hernias, masses or organomegaly.  ?Lymphatics: Unremarkable.  ?Musculoskeletal: Full ROM all peripheral extremities, joint stability, 5/5 strength and normal gait.  ?Skin: Warm, dry without exposed rashes, lesions or ecchymosis apparent.  ?Neuro: Cranial nerves intact, reflexes equal bilaterally. Sensory-motor testing grossly intact. Tendon reflexes grossly intact.  ?Pysch: Alert & oriented x 3.  Insight and judgement nl & appropriate. No ideations. ? ?Assessment and Plan: ? ?1. Labile hypertension ?   ?- Continue medication, monitor blood pressure at home.  ?- Continue DASH diet.  Reminder to go to the ER if any CP,  ?SOB, nausea, dizziness, severe HA, changes vision/speech. ?   ?- CBC with Differential/Platelet ?- COMPLETE METABOLIC PANEL WITH GFR ?- Magnesium ?- TSH ? ?2. Hyperlipidemia, mixed ? ?- Continue diet/meds, exercise,& lifestyle modifications.  ?- Continue monitor periodic cholesterol/liver & renal functions  ?   ?- Lipid panel ?- TSH ? ?3. Abnormal glucose ? ?- Hemoglobin A1c ?- Insulin, random ? ?4. Vitamin D deficiency ? ?- Continue diet, exercise  ?- Lifestyle modifications.  ?- Monitor appropriate labs. ?- Continue supplementation. ?  ?- VITAMIN D 25 Hydroxy ? ?5. Paroxysmal atrial fibrillation (HCC) ? ?- TSH ? ?6. Medication management ? ?- CBC with Differential/Platelet ?- COMPLETE METABOLIC PANEL WITH GFR ?- Magnesium ?- Lipid panel ?- TSH ?- Hemoglobin A1c ?- Insulin, random ?- VITAMIN D 25 Hydroxy  ? ? ? ?      Discussed  regular  exercise, BP monitoring, weight control to achieve/maintain BMI less than 25 and discussed med and SE's. Recommended labs to assess /monitor clinical status .  I discussed the assessment and treatment plan with the pa

## 2021-07-28 ENCOUNTER — Ambulatory Visit (INDEPENDENT_AMBULATORY_CARE_PROVIDER_SITE_OTHER): Payer: Medicare Other | Admitting: Internal Medicine

## 2021-07-28 ENCOUNTER — Encounter: Payer: Self-pay | Admitting: Internal Medicine

## 2021-07-28 VITALS — BP 110/78 | HR 85 | Temp 97.9°F | Ht 70.0 in | Wt 235.8 lb

## 2021-07-28 DIAGNOSIS — E559 Vitamin D deficiency, unspecified: Secondary | ICD-10-CM | POA: Diagnosis not present

## 2021-07-28 DIAGNOSIS — I480 Paroxysmal atrial fibrillation: Secondary | ICD-10-CM | POA: Diagnosis not present

## 2021-07-28 DIAGNOSIS — R7309 Other abnormal glucose: Secondary | ICD-10-CM | POA: Diagnosis not present

## 2021-07-28 DIAGNOSIS — R0989 Other specified symptoms and signs involving the circulatory and respiratory systems: Secondary | ICD-10-CM | POA: Diagnosis not present

## 2021-07-28 DIAGNOSIS — E782 Mixed hyperlipidemia: Secondary | ICD-10-CM

## 2021-07-28 DIAGNOSIS — Z79899 Other long term (current) drug therapy: Secondary | ICD-10-CM | POA: Diagnosis not present

## 2021-07-28 NOTE — Patient Instructions (Signed)

## 2021-07-29 LAB — COMPLETE METABOLIC PANEL WITH GFR
AG Ratio: 2.1 (calc) (ref 1.0–2.5)
ALT: 12 U/L (ref 6–29)
AST: 15 U/L (ref 10–35)
Albumin: 4.1 g/dL (ref 3.6–5.1)
Alkaline phosphatase (APISO): 59 U/L (ref 37–153)
BUN: 17 mg/dL (ref 7–25)
CO2: 27 mmol/L (ref 20–32)
Calcium: 9.6 mg/dL (ref 8.6–10.4)
Chloride: 106 mmol/L (ref 98–110)
Creat: 0.71 mg/dL (ref 0.60–0.95)
Globulin: 2 g/dL (calc) (ref 1.9–3.7)
Glucose, Bld: 96 mg/dL (ref 65–99)
Potassium: 4 mmol/L (ref 3.5–5.3)
Sodium: 144 mmol/L (ref 135–146)
Total Bilirubin: 0.5 mg/dL (ref 0.2–1.2)
Total Protein: 6.1 g/dL (ref 6.1–8.1)
eGFR: 85 mL/min/{1.73_m2} (ref >=60)

## 2021-07-29 LAB — CBC WITH DIFFERENTIAL/PLATELET
Absolute Monocytes: 549 cells/uL (ref 200–950)
Basophils Absolute: 18 cells/uL (ref 0–200)
Basophils Relative: 0.3 %
Eosinophils Absolute: 79 cells/uL (ref 15–500)
Eosinophils Relative: 1.3 %
HCT: 38.9 % (ref 35.0–45.0)
Hemoglobin: 13.5 g/dL (ref 11.7–15.5)
Lymphs Abs: 1751 cells/uL (ref 850–3900)
MCH: 33.3 pg — ABNORMAL HIGH (ref 27.0–33.0)
MCHC: 34.7 g/dL (ref 32.0–36.0)
MCV: 96 fL (ref 80.0–100.0)
MPV: 11.4 fL (ref 7.5–12.5)
Monocytes Relative: 9 %
Neutro Abs: 3703 cells/uL (ref 1500–7800)
Neutrophils Relative %: 60.7 %
Platelets: 206 10*3/uL (ref 140–400)
RBC: 4.05 10*6/uL (ref 3.80–5.10)
RDW: 12.7 % (ref 11.0–15.0)
Total Lymphocyte: 28.7 %
WBC: 6.1 10*3/uL (ref 3.8–10.8)

## 2021-07-29 LAB — INSULIN, RANDOM: Insulin: 9.2 u[IU]/mL

## 2021-07-29 LAB — LIPID PANEL
Cholesterol: 214 mg/dL — ABNORMAL HIGH (ref <200)
HDL: 72 mg/dL (ref >=50)
LDL Cholesterol (Calc): 121 mg/dL (calc) — ABNORMAL HIGH
Non-HDL Cholesterol (Calc): 142 mg/dL (calc) — ABNORMAL HIGH (ref <130)
Total CHOL/HDL Ratio: 3 (calc) (ref <5.0)
Triglycerides: 104 mg/dL (ref <150)

## 2021-07-29 LAB — VITAMIN D 25 HYDROXY (VIT D DEFICIENCY, FRACTURES): Vit D, 25-Hydroxy: 70 ng/mL (ref 30–100)

## 2021-07-29 LAB — TSH: TSH: 2.73 mIU/L (ref 0.40–4.50)

## 2021-07-29 LAB — HEMOGLOBIN A1C
Hgb A1c MFr Bld: 5.8 % of total Hgb — ABNORMAL HIGH (ref <5.7)
Mean Plasma Glucose: 120 mg/dL
eAG (mmol/L): 6.6 mmol/L

## 2021-07-29 LAB — MAGNESIUM: Magnesium: 2 mg/dL (ref 1.5–2.5)

## 2021-07-29 NOTE — Progress Notes (Signed)
<><><><><><><><><><><><><><><><><><><><><><><><><><><><><><><><><> ?<><><><><><><><><><><><><><><><><><><><><><><><><><><><><><><><><> ? ?-    Total  Chol =   214     - Elevated  ?           (  Ideal  or  Goal is less than 180  !  )  ?&  ?-  Bad / Dangerous LDL  Chol = 121 - also Elevated  ?            (  Ideal  or  Goal is less than 70  !  )  ? ? - Recommend stricter  low cholesterol diet  ? ?- Cholesterol only comes from animal sources  ?- ie. meat, dairy, egg yolks ? ?- Eat all the vegetables you want. ? ?- Avoid Meat, Avoid Meat,  Avoid Meat - especially Red Meat - Beef AND Pork . ? ?- Avoid cheese & dairy - milk & ice cream.    ? ?- Cheese is the most concentrated form of trans-fats which  ?is the worst thing to clog up our arteries.  ? ?- Veggie cheese is OK which can be found in the fresh  ?produce section at [employer] or [employer] or [employer] ?<><><><><><><><><><><><><><><><><><><><><><><><><><><><><><><><><> ? ?-  A1c = 5.8% - Blood sugar and A1c are  STILL elevated ? in the borderline and early or pre-diabetes range which has the same  ? ?300% increased risk for heart attack, stroke, cancer and  ?                                  alzheimer- type vascular dementia as full blown diabetes.  ? ?But the good news is that diet, exercise with weight loss can  ?                                                                         cure the early diabetes at this point. ?<><><><><><><><><><><><><><><><><><><><><><><><><><><><><><><><><> ? ? ?Being diabetic has a  300% increased risk for heart attack,  ?stroke, cancer, and alzheimer- type vascular dementia.  ? ?It is very important that you work harder with diet by  ?avoiding all foods that are white except chicken,  fish & calliflower. ? ?- Avoid white rice  ?(brown & wild rice is OK),  ? ?- Avoid white potatoes  ?(sweet potatoes in moderation is OK),  ? ?White bread or wheat bread or anything made out of  ? ?white flour like bagels, donuts, rolls, buns,  biscuits, cakes, ? ?- pastries, cookies, pizza crust, and pasta (made from  ?white flour & egg whites)  ? ?- vegetarian pasta or spinach or wheat pasta is OK. ? ?- Multigrain breads like Arnold's, [employer] or  ? ?multigrain sandwich thins or high fiber breads like  ? ?[place] bread or "Dave[employer] Killer" breads that are  ?4 to 5 grams fiber per slice !  are best.   ? ?Diet, exercise and weight loss can reverse and cure  ?diabetes in the early stages.

## 2021-08-05 DIAGNOSIS — J40 Bronchitis, not specified as acute or chronic: Secondary | ICD-10-CM | POA: Diagnosis not present

## 2021-09-05 DIAGNOSIS — J40 Bronchitis, not specified as acute or chronic: Secondary | ICD-10-CM | POA: Diagnosis not present

## 2021-09-10 ENCOUNTER — Encounter: Payer: Self-pay | Admitting: Family Medicine

## 2021-09-10 ENCOUNTER — Ambulatory Visit: Payer: Medicare Other | Admitting: Family Medicine

## 2021-09-10 VITALS — BP 118/63 | HR 63 | Ht 70.0 in | Wt 232.5 lb

## 2021-09-10 DIAGNOSIS — Z9989 Dependence on other enabling machines and devices: Secondary | ICD-10-CM

## 2021-09-10 DIAGNOSIS — G4733 Obstructive sleep apnea (adult) (pediatric): Secondary | ICD-10-CM | POA: Diagnosis not present

## 2021-09-10 NOTE — Progress Notes (Signed)
PATIENT: Darlene [name] DOB: 1940/08/15  REASON FOR VISIT: follow up HISTORY FROM: patient  Chief Complaint  Patient presents with   Obstructive Sleep Apnea    Rm 1, alone. Here for yearly CPAP f/u. Pt continues to have mask issues, mask continues to move around. Has tried 3 different masks.      HISTORY OF PRESENT ILLNESS:  09/10/21 ALL:  Darlene [name] returns for follow up for OSA on CPAP. She continues to struggle with finding a mask that works well for her. She has tried two different nasal pillow masks and a traditional FFM. She has leaks with most all or has difficulty with dry mouth. She used a nasal pillow that seemed to work best but caused skin irritation to nares. She has not tried a barrier cream. She has not noted any significant benefit in how she feels with using therapy. She uses therapy for about 5 hours. Once she wakes to urinate at night she removes therapy and does not resume.   Compliance report dated 08/10/2021-09/08/2021 shows she used CPAP 30/30 days. She used therapy greater than 4 hours 27/30 days (90%). Average usage was 5hr 14mn. Residual AHI 5.8/hr on 7-17cmH20. Large leak noted in 95th percentile of 30.9 l/min.   05/20/2020 ALL: Darlene CUTHRELLis a 81y.o. female here today for follow up for OSA on CPAP.  She is doing well on therapy. She is not sure that she feels any better but recognizes need for therapy. She switched to a nasal pillow but feels it is causing tenderness of her nares. She would like to try a nasal mask. Otherwise, doing very well.      HISTORY: (copied from Dr Dohmeier's previous note)  Rv 11-14-2019-   Darlene DREYFUSSis a 81year old white female patient seen here for a revisit  on 11/14/2019 ; I had ordered sleep studies after her last visit here with me which was actually her first and the patient presented on [id number] in lab sleep study which had been ordered requested by her primary care physician as well as by Dr. KBronson Ingher  cardiologist at the time.  Patient has known atrial fibrillation congestive due to diastolic heart failure.  Apnea hypopnea index and that study was 11/h REM AHI was 19.9 non-REM AHI was 8.8 and in supine position while sleeping on her back, her AHI was 17.2 overall this was mild apnea but with the lack of oxygen desaturation time.  And in addition it appeared to be a classic apnea slightly more central events and obstructive.  For this reason we did not want to autotitrator also to return for CPAP titration.  That took place on 07-11-19 and here now the patient was put first exposed to CPAP but it did not work CPAP titration left her with a residual apnea index of 10.4 for that reason she had to be titrated to BiPAP CPAP highest pressure tried was 16 cmH2O.   On the BiPAP of 15 under 19 there was no reduction of the apnea either she still had 30 apneas per hour.  The technician gave her a Mirage Quatro mask and medium full facemask.  She remained was hypoxic oxygen saturation for much of the study diagnosis was severe central sleep apnea now treatment emerging failure to alleviate on the CPAP or BiPAP.   Foot attempts to control her apnea at this time she was titrated to ASV which she responded to beautifully.  A very first sleep study  had documented an AHI of 53.7 on May 24, 2019 her ASV study allowed an AHI of 0.0 apneas per hour on the a pressure of 10 cmH2O.     I have now seen her download- she has been placed on auto CPAP and is doing well, we will change the mask form a FFM ( ? Why was she given a FFM?) to the desired nasal mask. She would like an ESON medium mask.    REVIEW OF SYSTEMS: Out of a complete 14 system review of symptoms, the patient complains only of the following symptoms, none and all other reviewed systems are negative.  FSS: 63   ALLERGIES: Allergies  Allergen Reactions   Cephalosporins Rash    HOME MEDICATIONS: Outpatient Medications Prior to Visit  Medication Sig  Dispense Refill   acetaminophen (TYLENOL) 500 MG tablet Take 500 mg by mouth every 6 (six) hours as needed.     apixaban (ELIQUIS) 5 MG TABS tablet Take 1 tablet (5 mg total) by mouth 2 (two) times daily. 180 tablet 3   Ascorbic Acid (VITAMIN C) 500 MG CAPS Take 500 mg by mouth.      CHOLECALCIFEROL PO Take 5,000 Units by mouth daily.     Cyanocobalamin 2500 MCG TABS Take 2,500 mcg by mouth daily.     cyclobenzaprine (FLEXERIL) 10 MG tablet Take 1/2 to 1 tablet    2 to 3 x /day if needed for Muscle Spasms 90 tablet 0   diltiazem (CARDIZEM CD) 300 MG 24 hr capsule Take 1 capsule by mouth once daily 90 capsule 0   ferrous sulfate 325 (65 FE) MG tablet Take 1 tablet (325 mg total) by mouth every other day.     ipratropium-albuterol (DUONEB) 0.5-2.5 (3) MG/3ML SOLN Take 3 mLs by nebulization every 4 (four) hours as needed. Max:6 doses per day 540 mL 0   lansoprazole (PREVACID) 30 MG capsule TAKE 1 CAPSULE BY MOUTH  DAILY FOR HEARTBURN AND  INDIGESTION 90 capsule 3   Magnesium 250 MG TABS Take by mouth.     metoprolol tartrate (LOPRESSOR) 25 MG tablet TAKE 2 TABLETS BY MOUTH IN  THE MORNING AND 1 TABLET IN THE EVENING 270 tablet 3   nitroGLYCERIN (NITROSTAT) 0.4 MG SL tablet Place 1 tablet (0.4 mg total) under the tongue every 5 (five) minutes as needed for chest pain. 25 tablet 3   PARoxetine (PAXIL) 20 MG tablet TAKE 1 TABLET BY MOUTH  DAILY FOR MOOD 90 tablet 3   Probiotic Product (PROBIOTIC DAILY PO) Take 1 tablet by mouth daily.     psyllium (METAMUCIL) 58.6 % packet Take 1 packet by mouth daily.     zinc gluconate 50 MG tablet Take 50 mg by mouth daily.     No facility-administered medications prior to visit.    PAST MEDICAL HISTORY: Past Medical History:  Diagnosis Date   Anxiety    Chronic lower back pain    Depression    DJD (degenerative joint disease)    knees, neck   GERD (gastroesophageal reflux disease)    has had dilitation in the past   Hypertension     PAST SURGICAL  HISTORY: Past Surgical History:  Procedure Laterality Date   ABDOMINAL HYSTERECTOMY  03/1979   BREAST BIOPSY Left 1986 and 1987   CATARACT EXTRACTION, BILATERAL Bilateral 2014   Dr. Madelin Headings, in [place]: Family History  Problem Relation Age of Onset   Hypertension Mother    Heart  disease Mother    Hypertension Father    Heart disease Father    Tremor Sister    Dementia Brother    Hypertension Daughter    Colon cancer Neg Hx    Esophageal cancer Neg Hx    Rectal cancer Neg Hx    Stomach cancer Neg Hx     SOCIAL HISTORY: Social History   Socioeconomic History   Marital status: Widowed    Spouse name: Not on file   Number of children: 2   Years of education: Not on file   Highest education level: 12th grade  Occupational History   Occupation: 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  Tobacco Use   Smoking status: Never   Smokeless tobacco: Never  Vaping Use   Vaping Use: Never used  Substance and Sexual Activity   Alcohol use: Yes    Alcohol/week: 3.0 standard drinks of alcohol    Types: 3 Standard drinks or equivalent per week    Comment: 3 nights out of the week   Drug use: No   Sexual activity: Not Currently  Other Topics Concern   Not on file  Social History Narrative   Not on file   Social Determinants of Health   Financial Resource Strain: Low Risk  (02/03/2021)   Overall Financial Resource Strain (CARDIA)    Difficulty of Paying Living Expenses: Not very hard  Food Insecurity: No Food Insecurity (07/17/2021)   Hunger Vital Sign    Worried About Running Out of Food in the Last Year: Never true    [place] in the Last Year: Never true  Transportation Needs: No Transportation Needs (07/17/2021)   PRAPARE - Hydrologist (Medical): No    Lack of Transportation (Non-Medical): No  Physical Activity: Inactive (04/28/2019)   Exercise Vital Sign    Days of Exercise per Week: 0 days    Minutes of Exercise  per Session: 0 min  Stress: No Stress Concern Present (04/28/2019)   [place]    Feeling of Stress : Only a little  Social Connections: Moderately Integrated (04/28/2019)   Social Connection and Isolation Panel [NHANES]    Frequency of Communication with Friends and Family: More than three times a week    Frequency of Social Gatherings with Friends and Family: More than three times a week    Attends Religious Services: 1 to 4 times per year    Active Member of [employer] or Organizations: Yes    Attends Archivist Meetings: More than 4 times per year    Marital Status: Widowed  Intimate Partner Violence: Not At Risk (04/28/2019)   Humiliation, Afraid, Rape, and Kick questionnaire    Fear of Current or Ex-Partner: No    Emotionally Abused: No    Physically Abused: No    Sexually Abused: No     PHYSICAL EXAM  Vitals:   09/10/21 1416  BP: 118/63  Pulse: 63  Weight: 232 lb 8 oz (105.5 kg)  Height: '5\' 10"'$  (1.778 m)    Body mass index is 33.36 kg/m.  Generalized: Well developed, in no acute distress  Cardiology: normal rate and irregularly irregular rhythm, no murmur noted Respiratory: clear to auscultation bilaterally  Neurological examination  Mentation: Alert oriented to time, place, history taking. Follows all commands speech and language fluent Cranial nerve II-XII: Pupils were equal round reactive to light. Extraocular movements were full, visual field were  full  Motor: The motor testing reveals 5 over 5 strength of all 4 extremities. Good symmetric motor tone is noted throughout.  Gait and station: Gait is arthritic but stable with cane     DIAGNOSTIC DATA (LABS, IMAGING, TESTING) - I reviewed patient records, labs, notes, testing and imaging myself where available.      No data to display           Lab Results  Component Value Date   WBC 6.1 07/28/2021   HGB 13.5 07/28/2021   HCT 38.9  07/28/2021   MCV 96.0 07/28/2021   PLT 206 07/28/2021      Component Value Date/Time   NA 144 07/28/2021 1415   K 4.0 07/28/2021 1415   CL 106 07/28/2021 1415   CO2 27 07/28/2021 1415   GLUCOSE 96 07/28/2021 1415   BUN 17 07/28/2021 1415   CREATININE 0.71 07/28/2021 1415   CALCIUM 9.6 07/28/2021 1415   PROT 6.1 07/28/2021 1415   ALBUMIN 2.8 (L) 04/15/2019 1216   AST 15 07/28/2021 1415   ALT 12 07/28/2021 1415   ALKPHOS 77 04/15/2019 1216   BILITOT 0.5 07/28/2021 1415   GFRNONAA >60 08/07/2020 1315   GFRNONAA 75 04/03/2020 1030   GFRAA 87 04/03/2020 1030   Lab Results  Component Value Date   CHOL 214 (H) 07/28/2021   HDL 72 07/28/2021   LDLCALC 121 (H) 07/28/2021   TRIG 104 07/28/2021   CHOLHDL 3.0 07/28/2021   Lab Results  Component Value Date   HGBA1C 5.8 (H) 07/28/2021   Lab Results  Component Value Date   VITAMINB12 423 03/31/2017   Lab Results  Component Value Date   TSH 2.73 07/28/2021     ASSESSMENT AND PLAN 81 y.o. year old female  has a past medical history of Anxiety, Chronic lower back pain, Depression, DJD (degenerative joint disease), GERD (gastroesophageal reflux disease), and Hypertension. here with     ICD-10-CM   1. OSA on CPAP  G47.33 For home use only DME continuous positive airway pressure (CPAP)   Z99.89       Darlene [name] is continuing CPAP therapy but has struggled to find a mask that works for her. Compliance report reveals excellent compliance. She will continue to monitor for leak at home. We will have her try a saline gel or barrier cream with previous nasal pillow that was best fit for her. She was encouraged to continue using CPAP nightly and for greater than 4 hours each night. We will update supply orders as indicated. Risks of untreated sleep apnea review and education materials provided. Healthy lifestyle habits encouraged. She will follow up in 6 months, sooner if needed. She verbalizes understanding and agreement with this  plan.    Orders Placed This Encounter  Procedures   For home use only DME continuous positive airway pressure (CPAP)    Supplies    Order Specific Question:   Length of Need    Answer:   Lifetime    Order Specific Question:   Patient has OSA or probable OSA    Answer:   Yes    Order Specific Question:   Is the patient currently using CPAP in the home    Answer:   Yes    Order Specific Question:   Settings    Answer:   Other see comments    Order Specific Question:   CPAP supplies needed    Answer:   Mask, headgear, cushions, filters, heated tubing and  water chamber     No orders of the defined types were placed in this encounter.       [name], FNP-C 09/10/2021, 3:07 PM [facility] [address] [place], [place] [address] [phone]

## 2021-09-10 NOTE — Patient Instructions (Addendum)
Please continue using your CPAP regularly. While your insurance requires that you use CPAP at least 4 hours each night on 70% of the nights, I recommend, that you not skip any nights and use it throughout the night if you can. Getting used to CPAP and staying with the treatment long term does take time and patience and discipline. Untreated obstructive sleep apnea when it is moderate to severe can have an adverse impact on cardiovascular health and raise her risk for heart disease, arrhythmias, hypertension, congestive heart failure, stroke and diabetes. Untreated obstructive sleep apnea causes sleep disruption, nonrestorative sleep, and sleep deprivation. This can have an impact on your day to day functioning and cause daytime sleepiness and impairment of cognitive function, memory loss, mood disturbance, and problems focussing. Using CPAP regularly can improve these symptoms.  Try using a saline gel as a barrier with the previous nasal pillow. Call DME if you wish to try a new mask. I will be happy to provide a refitting order if needed.   Follow up in 6 months, may push out to year if doing well.

## 2021-09-11 NOTE — Progress Notes (Signed)
CM sent to [facility] for new order ?

## 2021-09-23 ENCOUNTER — Other Ambulatory Visit: Payer: Self-pay | Admitting: *Deleted

## 2021-09-23 NOTE — Patient Outreach (Signed)
[place] [facility]) Care Management  09/23/2021  Darlene [name] 1940-07-20 [id number]  Unsuccessful outreach attempt made to patient. RN Health Coach left HIPAA compliant voicemail message along with her contact information.  Plan: RN Health Coach will call patient within the month of August.  [name] RN, BSN [place] [phone] [name]

## 2021-09-29 ENCOUNTER — Encounter: Payer: Self-pay | Admitting: Cardiology

## 2021-09-29 ENCOUNTER — Ambulatory Visit: Payer: Medicare Other | Admitting: Cardiology

## 2021-09-29 VITALS — BP 110/70 | HR 66 | Ht 70.0 in | Wt 232.0 lb

## 2021-09-29 DIAGNOSIS — R0789 Other chest pain: Secondary | ICD-10-CM | POA: Diagnosis not present

## 2021-09-29 DIAGNOSIS — I4891 Unspecified atrial fibrillation: Secondary | ICD-10-CM | POA: Diagnosis not present

## 2021-09-29 DIAGNOSIS — D6869 Other thrombophilia: Secondary | ICD-10-CM | POA: Diagnosis not present

## 2021-09-29 NOTE — Patient Instructions (Signed)
Medication Instructions:  Your physician recommends that you continue on your current medications as directed. Please refer to the Current Medication list given to you today.   Labwork: None  Testing/Procedures: None  Follow-Up: Follow up with Dr. Branch in 1 year.   Any Other Special Instructions Will Be Listed Below (If Applicable).     If you need a refill on your cardiac medications before your next appointment, please call your pharmacy.  

## 2021-09-29 NOTE — Progress Notes (Signed)
Clinical Summary Darlene [name] is a 81 y.o.female seen today for follow up of the following medical problems.    1. Afib - due to LAE rate control strategy has been pursued        -no significant palpitations - compliant with meds  - no bleeding on eliquis   2. Chest pain 11/2019 nuclear stress: no ischemia - no recent chest pain.      3. Pulmonary HTN Jan 2021 echo normal RV, PASP 41 - PFTs were benign - sleep study did show severe OSA, likely etiology.  - compliant cpap machine. Followed by [facility]   - no recent SOB/DOE.    4.OSA -on cpap, followed nerurology.      Past Medical History:  Diagnosis Date   Anxiety    Chronic lower back pain    Depression    DJD (degenerative joint disease)    knees, neck   GERD (gastroesophageal reflux disease)    has had dilitation in the past   Hypertension      Allergies  Allergen Reactions   Cephalosporins Rash     Current Outpatient Medications  Medication Sig Dispense Refill   acetaminophen (TYLENOL) 500 MG tablet Take 500 mg by mouth every 6 (six) hours as needed.     apixaban (ELIQUIS) 5 MG TABS tablet Take 1 tablet (5 mg total) by mouth 2 (two) times daily. 180 tablet 3   Ascorbic Acid (VITAMIN C) 500 MG CAPS Take 500 mg by mouth.      CHOLECALCIFEROL PO Take 5,000 Units by mouth daily.     Cyanocobalamin 2500 MCG TABS Take 2,500 mcg by mouth daily.     cyclobenzaprine (FLEXERIL) 10 MG tablet Take 1/2 to 1 tablet    2 to 3 x /day if needed for Muscle Spasms 90 tablet 0   diltiazem (CARDIZEM CD) 300 MG 24 hr capsule Take 1 capsule by mouth once daily 90 capsule 0   ferrous sulfate 325 (65 FE) MG tablet Take 1 tablet (325 mg total) by mouth every other day.     ipratropium-albuterol (DUONEB) 0.5-2.5 (3) MG/3ML SOLN Take 3 mLs by nebulization every 4 (four) hours as needed. Max:6 doses per day 540 mL 0   lansoprazole (PREVACID) 30 MG capsule TAKE 1 CAPSULE BY MOUTH  DAILY FOR HEARTBURN AND  INDIGESTION 90  capsule 3   Magnesium 250 MG TABS Take by mouth.     metoprolol tartrate (LOPRESSOR) 25 MG tablet TAKE 2 TABLETS BY MOUTH IN  THE MORNING AND 1 TABLET IN THE EVENING 270 tablet 3   nitroGLYCERIN (NITROSTAT) 0.4 MG SL tablet Place 1 tablet (0.4 mg total) under the tongue every 5 (five) minutes as needed for chest pain. 25 tablet 3   PARoxetine (PAXIL) 20 MG tablet TAKE 1 TABLET BY MOUTH  DAILY FOR MOOD 90 tablet 3   Probiotic Product (PROBIOTIC DAILY PO) Take 1 tablet by mouth daily.     psyllium (METAMUCIL) 58.6 % packet Take 1 packet by mouth daily.     zinc gluconate 50 MG tablet Take 50 mg by mouth daily.     No current facility-administered medications for this visit.     Past Surgical History:  Procedure Laterality Date   ABDOMINAL HYSTERECTOMY  03/1979   BREAST BIOPSY Left 1986 and 1987   CATARACT EXTRACTION, BILATERAL Bilateral 2014   Dr. Madelin Headings, in [place]     Allergies  Allergen Reactions   Cephalosporins Rash  Family History  Problem Relation Age of Onset   Hypertension Mother    Heart disease Mother    Hypertension Father    Heart disease Father    Tremor Sister    Dementia Brother    Hypertension Daughter    Colon cancer Neg Hx    Esophageal cancer Neg Hx    Rectal cancer Neg Hx    Stomach cancer Neg Hx      Social History Darlene [name] reports that she has never smoked. She has never used smokeless tobacco. Darlene [name] reports current alcohol use of about 3.0 standard drinks of alcohol per week.   Review of Systems CONSTITUTIONAL: No weight loss, fever, chills, weakness or fatigue.  HEENT: Eyes: No visual loss, blurred vision, double vision or yellow sclerae.No hearing loss, sneezing, congestion, runny nose or sore throat.  SKIN: No rash or itching.  CARDIOVASCULAR: per hpi RESPIRATORY: No shortness of breath, cough or sputum.  GASTROINTESTINAL: No anorexia, nausea, vomiting or diarrhea. No abdominal pain or blood.  GENITOURINARY: No burning  on urination, no polyuria NEUROLOGICAL: No headache, dizziness, syncope, paralysis, ataxia, numbness or tingling in the extremities. No change in bowel or bladder control.  MUSCULOSKELETAL: No muscle, back pain, joint pain or stiffness.  LYMPHATICS: No enlarged nodes. No history of splenectomy.  PSYCHIATRIC: No history of depression or anxiety.  ENDOCRINOLOGIC: No reports of sweating, cold or heat intolerance. No polyuria or polydipsia.  Marland Kitchen   Physical Examination Today's Vitals   09/29/21 1533  BP: 110/70  Pulse: 66  SpO2: 95%  Weight: 232 lb (105.2 kg)  Height: '5\' 10"'$  (1.778 m)   Body mass index is 33.29 kg/m.  Gen: resting comfortably, no acute distress HEENT: no scleral icterus, pupils equal round and reactive, no palptable cervical adenopathy,  CV: irreg, no m/r/g no jvd Resp: Clear to auscultation bilaterally GI: abdomen is soft, non-tender, non-distended, normal bowel sounds, no hepatosplenomegaly MSK: extremities are warm, no edema.  Skin: warm, no rash Neuro:  no focal deficits Psych: appropriate affect   Diagnostic Studies  Echo 04/16/19 IMPRESSIONS     1. Left ventricular ejection fraction, by visual estimation, is 55 to  60%. The left ventricle has normal function. There is mildly increased  left ventricular hypertrophy.   2. Left ventricular diastolic parameters are indeterminate.   3. The left ventricle has no regional wall motion abnormalities.   4. Global right ventricle has normal systolic function.The right  ventricular size is moderately enlarged. No increase in right ventricular  wall thickness.   5. Left atrial size was severely dilated.   6. Right atrial size was moderately dilated.   7. The mitral valve is normal in structure. Mild mitral valve  regurgitation. No evidence of mitral stenosis.   8. The tricuspid valve is normal in structure.   9. The tricuspid valve is normal in structure. Tricuspid valve  regurgitation is mild.  10. The aortic  valve is tricuspid. Aortic valve regurgitation is not  visualized. No evidence of aortic valve sclerosis or stenosis.  11. The pulmonic valve was not well visualized. Pulmonic valve  regurgitation is not visualized.  12. Moderately elevated pulmonary artery systolic pressure.  13. The inferior vena cava is dilated in size with <50% respiratory  variability, suggesting right atrial pressure of 15 mmHg.      11/2019 nuclear stress There was no ST segment deviation noted during stress. The study is normal. There are no perfusion defect consistentw with prior infarct or current ischemia. This  is a low risk study. The left ventricular ejection fraction is normal (55-65%).   Assessment and Plan   1. Afib/acquired thrombophlia - no symptoms, continue current meds including eliquis for stroke prevention EKG shows rate controlled afib   2. Chest pain - negative stress test 2021 - no recent symptoms, continue to monitor  F/u 1 year     Arnoldo Lenis, M.D.

## 2021-10-02 ENCOUNTER — Other Ambulatory Visit: Payer: Self-pay | Admitting: *Deleted

## 2021-10-02 DIAGNOSIS — J40 Bronchitis, not specified as acute or chronic: Secondary | ICD-10-CM | POA: Diagnosis not present

## 2021-10-02 NOTE — Patient Outreach (Signed)
[place] [facility]) Care Management  10/02/2021  Darlene [name] 04-Sep-1940 [id number]  Spoke to patient who states she is feeling well and that her Afib is currently under control. Patient has successfully completed her health and wellness goals.   Plan: RN Health Coach will close case.    [name] RN, BSN [place] [phone] Markeia [email or web address]

## 2021-10-05 DIAGNOSIS — J40 Bronchitis, not specified as acute or chronic: Secondary | ICD-10-CM | POA: Diagnosis not present

## 2021-10-13 ENCOUNTER — Other Ambulatory Visit: Payer: Self-pay | Admitting: Cardiology

## 2021-10-28 DIAGNOSIS — J40 Bronchitis, not specified as acute or chronic: Secondary | ICD-10-CM | POA: Diagnosis not present

## 2021-10-29 ENCOUNTER — Ambulatory Visit (INDEPENDENT_AMBULATORY_CARE_PROVIDER_SITE_OTHER): Payer: Medicare Other | Admitting: Internal Medicine

## 2021-10-29 ENCOUNTER — Encounter: Payer: Self-pay | Admitting: Internal Medicine

## 2021-10-29 VITALS — BP 120/68 | HR 60 | Temp 97.9°F | Resp 16 | Ht 70.0 in | Wt 229.8 lb

## 2021-10-29 DIAGNOSIS — I10 Essential (primary) hypertension: Secondary | ICD-10-CM | POA: Diagnosis not present

## 2021-10-29 DIAGNOSIS — F419 Anxiety disorder, unspecified: Secondary | ICD-10-CM

## 2021-10-29 DIAGNOSIS — Z136 Encounter for screening for cardiovascular disorders: Secondary | ICD-10-CM | POA: Diagnosis not present

## 2021-10-29 DIAGNOSIS — Z0000 Encounter for general adult medical examination without abnormal findings: Secondary | ICD-10-CM

## 2021-10-29 DIAGNOSIS — Z79899 Other long term (current) drug therapy: Secondary | ICD-10-CM

## 2021-10-29 DIAGNOSIS — I272 Pulmonary hypertension, unspecified: Secondary | ICD-10-CM

## 2021-10-29 DIAGNOSIS — R7309 Other abnormal glucose: Secondary | ICD-10-CM | POA: Diagnosis not present

## 2021-10-29 DIAGNOSIS — E559 Vitamin D deficiency, unspecified: Secondary | ICD-10-CM | POA: Diagnosis not present

## 2021-10-29 DIAGNOSIS — I480 Paroxysmal atrial fibrillation: Secondary | ICD-10-CM

## 2021-10-29 DIAGNOSIS — Z1212 Encounter for screening for malignant neoplasm of rectum: Secondary | ICD-10-CM

## 2021-10-29 DIAGNOSIS — Z8249 Family history of ischemic heart disease and other diseases of the circulatory system: Secondary | ICD-10-CM

## 2021-10-29 DIAGNOSIS — Z0001 Encounter for general adult medical examination with abnormal findings: Secondary | ICD-10-CM

## 2021-10-29 DIAGNOSIS — E782 Mixed hyperlipidemia: Secondary | ICD-10-CM

## 2021-10-29 DIAGNOSIS — K2100 Gastro-esophageal reflux disease with esophagitis, without bleeding: Secondary | ICD-10-CM

## 2021-10-29 MED ORDER — BUSPIRONE HCL 10 MG PO TABS: ORAL_TABLET | ORAL | 1 refills | Status: DC

## 2021-10-29 MED ORDER — ESCITALOPRAM OXALATE 20 MG PO TABS: ORAL_TABLET | ORAL | 3 refills | Status: DC

## 2021-10-29 NOTE — Patient Instructions (Signed)
Due to recent changes in healthcare laws, you Dudash see the results of your imaging and laboratory studies on MyChart before your provider has had a chance to review them.  We understand that in some cases there Gin be results that are confusing or concerning to you. Not all laboratory results come back in the same time frame and the provider Hoback be waiting for multiple results in order to interpret others.  Please give [place] 48 hours in order for your provider to thoroughly review all the results before contacting the office for clarification of your results.   +++++++++++++++++++++++++  Vit D  & Vit C 1,000 mg   are recommended to help protect  against the Covid-19 and other Corona viruses.    Also it's recommended  to take  Zinc 50 mg  to help  protect against the Covid-19   and best place to get  is also on [email or web address]  and don't pay more than 6-8 cents /pill !  ================================ Coronavirus (COVID-19) Are you at risk?  Are you at risk for the Coronavirus (COVID-19)?  To be considered HIGH RISK for Coronavirus (COVID-19), you have to meet the following criteria:  Traveled to [place], [place], [place], [place] or [place]; or in the [place] to [place], [place], [place]  or [place]; and have fever, cough, and shortness of breath within the last 2 weeks of travel OR Been in close contact with a person diagnosed with COVID-19 within the last 2 weeks and have  fever, cough,and shortness of breath  IF YOU DO NOT MEET THESE CRITERIA, YOU ARE CONSIDERED LOW RISK FOR COVID-19.  What to do if you are HIGH RISK for COVID-19?  If you are having a medical emergency, call 911. Seek medical care right away. Before you go to a doctor's office, urgent care or emergency department,  call ahead and tell them about your recent travel, contact with someone diagnosed with COVID-19   and your symptoms.  You should receive instructions from your physician's office regarding next  steps of care.  When you arrive at healthcare provider, tell the healthcare staff immediately you have returned from  visiting [place], [place], [place], [place] or [place]; or traveled in the [place] to [place], [place],  [place] or [place] in the last two weeks or you have been in close contact with a person diagnosed with  COVID-19 in the last 2 weeks.   Tell the health care staff about your symptoms: fever, cough and shortness of breath. After you have been seen by a medical provider, you will be either: Tested for (COVID-19) and discharged home on quarantine except to seek medical care if  symptoms worsen, and asked to  Stay home and avoid contact with others until you get your results (4-5 days)  Avoid travel on public transportation if possible (such as bus, train, or airplane) or Sent to the Emergency Department by EMS for evaluation, COVID-19 testing  and  possible admission depending on your condition and test results.  What to do if you are LOW RISK for COVID-19?  Reduce your risk of any infection by using the same precautions used for avoiding the common cold or flu:  Wash your hands often with soap and warm water for at least 20 seconds.  If soap and water are not readily available,  use an alcohol-based hand sanitizer with at least 60% alcohol.  If coughing or sneezing, cover your mouth and nose by coughing  or sneezing into the elbow areas of your shirt or coat,  into a tissue or into your sleeve (not your hands). Avoid shaking hands with others and consider head nods or verbal greetings only. Avoid touching your eyes, nose, or mouth with unwashed hands.  Avoid close contact with people who are sick. Avoid places or events with large numbers of people in one location, like concerts or sporting events. Carefully consider travel plans you have or are making. If you are planning any travel outside or inside the [place], visit the CDC's Travelers' Health webpage for the  latest health notices. If you have some symptoms but not all symptoms, continue to monitor at home and seek medical attention  if your symptoms worsen. If you are having a medical emergency, call 911.   >>>>>>>>>>>>>>>>>>>>>>>>>>>>>>>>>  We Do NOT Approve of [employer] > > > > > > > > > > > > > > > > > > > > > > > > > > > > > > > > > > > > > > >  Preventive Care for Adults  A healthy lifestyle and preventive care can promote health and wellness. Preventive health guidelines for women include the following key practices. A routine yearly physical is a good way to check with your health care provider about your health and preventive screening. It is a chance to share any concerns and updates on your health and to receive a thorough exam. Visit your dentist for a routine exam and preventive care every 6 months. Brush your teeth twice a day and floss once a day. Good oral hygiene prevents tooth decay and gum disease. The frequency of eye exams is based on your age, health, family medical history, use of contact lenses, and other factors. Follow your health care provider's recommendations for frequency of eye exams. Eat a healthy diet. Foods like vegetables, fruits, whole grains, low-fat dairy products, and lean protein foods contain the nutrients you need without too many calories. Decrease your intake of foods high in solid fats, added sugars, and salt. Eat the right amount of calories for you. Get information about a proper diet from your health care provider, if necessary. Regular physical exercise is one of the most important things you can do for your health. Most adults should get at least 150 minutes of moderate-intensity exercise (any activity that increases your heart rate and causes you to sweat) each week. In addition, most adults need muscle-strengthening exercises on 2 or more days a week. Maintain a healthy weight. The body mass index (BMI) is a screening tool to identify  possible weight problems. It provides an estimate of body fat based on height and weight. Your health care provider can find your BMI and can help you achieve or maintain a healthy weight. For adults 20 years and older: A BMI below 18.5 is considered underweight. A BMI of 18.5 to 24.9 is normal. A BMI of 25 to 29.9 is considered overweight. A BMI of 30 and above is considered obese. Maintain normal blood lipids and cholesterol levels by exercising and minimizing your intake of saturated fat. Eat a balanced diet with plenty of fruit and vegetables. If your lipid or cholesterol levels are high, you are over 50, or you are at high risk for heart disease, you may need your cholesterol levels checked more frequently. Ongoing high lipid and cholesterol levels should be treated with medicines if diet and exercise are not working. If you smoke, find out from  your health care provider how to quit. If you do not use tobacco, do not start. Lung cancer screening is recommended for adults aged 44-80 years who are at high risk for developing lung cancer because of a history of smoking. A yearly low-dose CT scan of the lungs is recommended for people who have at least a 30-pack-year history of smoking and are a current smoker or have quit within the past 15 years. A pack year of smoking is smoking an average of 1 pack of cigarettes a day for 1 year (for example: 1 pack a day for 30 years or 2 packs a day for 15 years). Yearly screening should continue until the smoker has stopped smoking for at least 15 years. Yearly screening should be stopped for people who develop a health problem that would prevent them from having lung cancer treatment. Avoid use of street drugs. Do not share needles with anyone. Ask for help if you need support or instructions about stopping the use of drugs. High blood pressure causes heart disease and increases the risk of stroke.  Ongoing high blood pressure should be treated with medicines if  weight loss and exercise do not work. If you are 24-50 years old, ask your health care provider if you should take aspirin to prevent strokes. Diabetes screening involves taking a blood sample to check your fasting blood sugar level. This should be done once every 3 years, after age 74, if you are within normal weight and without risk factors for diabetes. Testing should be considered at a younger age or be carried out more frequently if you are overweight and have at least 1 risk factor for diabetes. Breast cancer screening is essential preventive care for women. You should practice "breast self-awareness." This means understanding the normal appearance and feel of your breasts and may include breast self-examination. Any changes detected, no matter how small, should be reported to a health care provider. Women in their 78s and 30s should have a clinical breast exam (CBE) by a health care provider as part of a regular health exam every 1 to 3 years. After age 45, women should have a CBE every year. Starting at age 8, women should consider having a mammogram (breast X-ray test) every year. Women who have a family history of breast cancer should talk to their health care provider about genetic screening. Women at a high risk of breast cancer should talk to their health care providers about having an MRI and a mammogram every year. Breast cancer gene (BRCA)-related cancer risk assessment is recommended for women who have family members with BRCA-related cancers. BRCA-related cancers include breast, ovarian, tubal, and peritoneal cancers. Having family members with these cancers may be associated with an increased risk for harmful changes (mutations) in the breast cancer genes BRCA1 and BRCA2. Results of the assessment will determine the need for genetic counseling and BRCA1 and BRCA2 testing. Routine pelvic exams to screen for cancer are no longer recommended for nonpregnant women who are considered low risk for  cancer of the pelvic organs (ovaries, uterus, and vagina) and who do not have symptoms. Ask your health care provider if a screening pelvic exam is right for you. If you have had past treatment for cervical cancer or a condition that could lead to cancer, you need Pap tests and screening for cancer for at least 20 years after your treatment. If Pap tests have been discontinued, your risk factors (such as having a new sexual partner) need to be  reassessed to determine if screening should be resumed. Some women have medical problems that increase the chance of getting cervical cancer. In these cases, your health care provider may recommend more frequent screening and Pap tests.  Colorectal cancer can be detected and often prevented. Most routine colorectal cancer screening begins at the age of 80 years and continues through age 15 years. However, your health care provider may recommend screening at an earlier age if you have risk factors for colon cancer. On a yearly basis, your health care provider may provide home test kits to check for hidden blood in the stool. Use of a small camera at the end of a tube, to directly examine the colon (sigmoidoscopy or colonoscopy), can detect the earliest forms of colorectal cancer. Talk to your health care provider about this at age 68, when routine screening begins.  Direct exam of the colon should be repeated every 5-10 years through age 74 years, unless early forms of pre-cancerous polyps or small growths are found. Osteoporosis is a disease in which the bones lose minerals and strength with aging. This can result in serious bone fractures or breaks. The risk of osteoporosis can be identified using a bone density scan. Women ages 20 years and over and women at risk for fractures or osteoporosis should discuss screening with their health care providers. Ask your health care provider whether you should take a calcium supplement or vitamin D to reduce the rate of  osteoporosis. Menopause can be associated with physical symptoms and risks. Hormone replacement therapy is available to decrease symptoms and risks. You should talk to your health care provider about whether hormone replacement therapy is right for you. Use sunscreen. Apply sunscreen liberally and repeatedly throughout the day. You should seek shade when your shadow is shorter than you. Protect yourself by wearing long sleeves, pants, a wide-brimmed hat, and sunglasses year round, whenever you are outdoors. Once a month, do a whole body skin exam, using a mirror to look at the skin on your back. Tell your health care provider of new moles, moles that have irregular borders, moles that are larger than a pencil eraser, or moles that have changed in shape or color. Stay current with required vaccines (immunizations). Influenza vaccine. All adults should be immunized every year. Tetanus, diphtheria, and acellular pertussis (Td, Tdap) vaccine. Pregnant women should receive 1 dose of Tdap vaccine during each pregnancy. The dose should be obtained regardless of the length of time since the last dose. Immunization is preferred during the 27th-36th week of gestation. An adult who has not previously received Tdap or who does not know her vaccine status should receive 1 dose of Tdap. This initial dose should be followed by tetanus and diphtheria toxoids (Td) booster doses every 10 years. Adults with an unknown or incomplete history of completing a 3-dose immunization series with Td-containing vaccines should begin or complete a primary immunization series including a Tdap dose. Adults should receive a Td booster every 10 years.  Zoster vaccine. One dose is recommended for adults aged 82 years or older unless certain conditions are present.  Pneumococcal 13-valent conjugate (PCV13) vaccine. When indicated, a person who is uncertain of her immunization history and has no record of immunization should receive the PCV13  vaccine. An adult aged 97 years or older who has certain medical conditions and has not been previously immunized should receive 1 dose of PCV13 vaccine. This PCV13 should be followed with a dose of pneumococcal polysaccharide (PPSV23) vaccine. The PPSV23  vaccine dose should be obtained at least 1 or more year(s) after the dose of PCV13 vaccine. An adult aged 102 years or older who has certain medical conditions and previously received 1 or more doses of PPSV23 vaccine should receive 1 dose of PCV13. The PCV13 vaccine dose should be obtained 1 or more years after the last PPSV23 vaccine dose.  Pneumococcal polysaccharide (PPSV23) vaccine. When PCV13 is also indicated, PCV13 should be obtained first. All adults aged 56 years and older should be immunized. An adult younger than age 40 years who has certain medical conditions should be immunized. Any person who resides in a nursing home or long-term care facility should be immunized. An adult smoker should be immunized. People with an immunocompromised condition and certain other conditions should receive both PCV13 and PPSV23 vaccines. People with human immunodeficiency virus (HIV) infection should be immunized as soon as possible after diagnosis. Immunization during chemotherapy or radiation therapy should be avoided. Routine use of PPSV23 vaccine is not recommended for American Indians, [place] Natives, or people younger than 65 years unless there are medical conditions that require PPSV23 vaccine. When indicated, people who have unknown immunization and have no record of immunization should receive PPSV23 vaccine. One-time revaccination 5 years after the first dose of PPSV23 is recommended for people aged 19-64 years who have chronic kidney failure, nephrotic syndrome, asplenia, or immunocompromised conditions. People who received 1-2 doses of PPSV23 before age 19 years should receive another dose of PPSV23 vaccine at age 34 years or later if at least 5 years have  passed since the previous dose. Doses of PPSV23 are not needed for people immunized with PPSV23 at or after age 36 years.  Preventive Services / Frequency  Ages 43 years and over Blood pressure check. Lipid and cholesterol check. Lung cancer screening. / Every year if you are aged 69-80 years and have a 30-pack-year history of smoking and currently smoke or have quit within the past 15 years. Yearly screening is stopped once you have quit smoking for at least 15 years or develop a health problem that would prevent you from having lung cancer treatment. Clinical breast exam.** / Every year after age 46 years.  BRCA-related cancer risk assessment.** / For women who have family members with a BRCA-related cancer (breast, ovarian, tubal, or peritoneal cancers). Mammogram.** / Every year beginning at age 52 years and continuing for as long as you are in good health. Consult with your health care provider. Pap test.** / Every 3 years starting at age 54 years through age 9 or 2 years with 3 consecutive normal Pap tests. Testing can be stopped between 65 and 70 years with 3 consecutive normal Pap tests and no abnormal Pap or HPV tests in the past 10 years. Fecal occult blood test (FOBT) of stool. / Every year beginning at age 43 years and continuing until age 6 years. You may not need to do this test if you get a colonoscopy every 10 years. Flexible sigmoidoscopy or colonoscopy.** / Every 5 years for a flexible sigmoidoscopy or every 10 years for a colonoscopy beginning at age 56 years and continuing until age 3 years. Hepatitis C blood test.** / For all people born from [birth year] through 1966 and any individual with known risks for hepatitis C. Osteoporosis screening.** / A one-time screening for women ages 21 years and over and women at risk for fractures or osteoporosis. Skin self-exam. / Monthly. Influenza vaccine. / Every year. Tetanus, diphtheria, and acellular pertussis (Tdap/Td) vaccine.** /  1 dose  of Td every 10 years. Zoster vaccine.** / 1 dose for adults aged 44 years or older. Pneumococcal 13-valent conjugate (PCV13) vaccine.** / Consult your health care provider. Pneumococcal polysaccharide (PPSV23) vaccine.** / 1 dose for all adults aged 29 years and older. Screening for abdominal aortic aneurysm (AAA)  by ultrasound is recommended for people who have history of high blood pressure or who are current or former smokers. ++++++++++++++++++++ Recommend Adult Low Dose Aspirin or  coated  Aspirin 81 mg daily  To reduce risk of Colon Cancer 40 %,  Skin Cancer 26 % ,  Melanoma 46%  and  Pancreatic cancer 60% ++++++++++++++++++++ Vitamin D goal  is between 70-100.  Please make sure that you are taking your Vitamin D as directed.  It is very important as a natural anti-inflammatory  helping hair, skin, and nails, as well as reducing stroke and heart attack risk.  It helps your bones and helps with mood. It also decreases numerous cancer risks so please take it as directed.  Low Vit D is associated with a 200-300% higher risk for CANCER  and 200-300% higher risk for HEART   ATTACK  &  STROKE.   .....................................Marland Kitchen It is also associated with higher death rate at younger ages,  autoimmune diseases like Rheumatoid arthritis, Lupus, Multiple Sclerosis.    Also many other serious conditions, like depression, Alzheimer's Dementia, infertility, muscle aches, fatigue, fibromyalgia - just to name a few. ++++++++++++++++++ Recommend the book "The END of DIETING" by Dr Excell Seltzer  & the book "The END of DIABETES " by Dr Excell Seltzer At [email or web address] - get book & Audio CD's    Being diabetic has a  300% increased risk for heart attack, stroke, cancer, and alzheimer- type vascular dementia. It is very important that you work harder with diet by avoiding all foods that are white. Avoid white rice (brown & wild rice is OK), white potatoes (sweetpotatoes in moderation is OK),  White bread or wheat bread or anything made out of white flour like bagels, donuts, rolls, buns, biscuits, cakes, pastries, cookies, pizza crust, and pasta (made from white flour & egg whites) - vegetarian pasta or spinach or wheat pasta is OK. Multigrain breads like Arnold's or [employer], or multigrain sandwich thins or flatbreads.  Diet, exercise and weight loss can reverse and cure diabetes in the early stages.  Diet, exercise and weight loss is very important in the control and prevention of complications of diabetes which affects every system in your body, ie. Brain - dementia/stroke, eyes - glaucoma/blindness, heart - heart attack/heart failure, kidneys - dialysis, stomach - gastric paralysis, intestines - malabsorption, nerves - severe painful neuritis, circulation - gangrene & loss of a leg(s), and finally cancer and Alzheimers.    I recommend avoid fried & greasy foods,  sweets/candy, white rice (brown or wild rice or Quinoa is OK), white potatoes (sweet potatoes are OK) - anything made from white flour - bagels, doughnuts, rolls, buns, biscuits,white and wheat breads, pizza crust and traditional pasta made of white flour & egg white(vegetarian pasta or spinach or wheat pasta is OK).  Multi-grain bread is OK - like multi-grain flat bread or sandwich thins. Avoid alcohol in excess. Exercise is also important.    Eat all the vegetables you want - avoid meat, especially red meat and dairy - especially cheese.  Cheese is the most concentrated form of trans-fats which is the worst thing to clog up our arteries. Veggie cheese is OK  which can be found in the fresh produce section at [facility] or [employer] or [employer]  +++++++++++++++++++ DASH Eating Plan  DASH stands for "Dietary Approaches to Stop Hypertension."   The DASH eating plan is a healthy eating plan that has been shown to reduce high blood pressure (hypertension). Additional health benefits may include reducing the risk of type 2  diabetes mellitus, heart disease, and stroke. The DASH eating plan may also help with weight loss. WHAT DO I NEED TO KNOW ABOUT THE DASH EATING PLAN? For the DASH eating plan, you will follow these general guidelines: Choose foods with a percent daily value for sodium of less than 5% (as listed on the food label). Use salt-free seasonings or herbs instead of table salt or sea salt. Check with your health care provider or pharmacist before using salt substitutes. Eat lower-sodium products, often labeled as "lower sodium" or "no salt added." Eat fresh foods. Eat more vegetables, fruits, and low-fat dairy products. Choose whole grains. Look for the word "whole" as the first word in the ingredient list. Choose fish  Limit sweets, desserts, sugars, and sugary drinks. Choose heart-healthy fats. Eat veggie cheese  Eat more home-cooked food and less restaurant, buffet, and fast food. Limit fried foods. Cook foods using methods other than frying. Limit canned vegetables. If you do use them, rinse them well to decrease the sodium. When eating at a restaurant, ask that your food be prepared with less salt, or no salt if possible.                      WHAT FOODS CAN I EAT? Read Dr Fara Olden Fuhrman's books on The End of Dieting & The End of Diabetes  Grains Whole grain or whole wheat bread. Brown rice. Whole grain or whole wheat pasta. Quinoa, bulgur, and whole grain cereals. Low-sodium cereals. Corn or whole wheat flour tortillas. Whole grain cornbread. Whole grain crackers. Low-sodium crackers.  Vegetables Fresh or frozen vegetables (raw, steamed, roasted, or grilled). Low-sodium or reduced-sodium tomato and vegetable juices. Low-sodium or reduced-sodium tomato sauce and paste. Low-sodium or reduced-sodium canned vegetables.   Fruits All fresh, canned (in natural juice), or frozen fruits.  Protein Products  All fish and seafood.  Dried beans, peas, or lentils. Unsalted nuts and seeds. Unsalted  canned beans.  Dairy Low-fat dairy products, such as skim or 1% milk, 2% or reduced-fat cheeses, low-fat ricotta or cottage cheese, or plain low-fat yogurt. Low-sodium or reduced-sodium cheeses.  Fats and Oils Tub margarines without trans fats. Light or reduced-fat mayonnaise and salad dressings (reduced sodium). Avocado. Safflower, olive, or canola oils. Natural peanut or almond butter.  Other Unsalted popcorn and pretzels. The items listed above may not be a complete list of recommended foods or beverages. Contact your dietitian for more options.  +++++++++++++++  WHAT FOODS ARE NOT RECOMMENDED? Grains/ White flour or wheat flour White bread. White pasta. White rice. Refined cornbread. Bagels and croissants. Crackers that contain trans fat.  Vegetables  Creamed or fried vegetables. Vegetables in a . Regular canned vegetables. Regular canned tomato sauce and paste. Regular tomato and vegetable juices.  Fruits Dried fruits. Canned fruit in light or heavy syrup. Fruit juice.  Meat and Other Protein Products Meat in general - RED meat & White meat.  Fatty cuts of meat. Ribs, chicken wings, all processed meats as bacon, sausage, bologna, salami, fatback, hot dogs, bratwurst and packaged luncheon meats.  Dairy Whole or 2% milk, cream, half-and-half, and cream cheese.  Whole-fat or sweetened yogurt. Full-fat cheeses or blue cheese. Non-dairy creamers and whipped toppings. Processed cheese, cheese spreads, or cheese curds.  Condiments Onion and garlic salt, seasoned salt, table salt, and sea salt. Canned and packaged gravies. Worcestershire sauce. Tartar sauce. Barbecue sauce. Teriyaki sauce. Soy sauce, including reduced sodium. Steak sauce. Fish sauce. Oyster sauce. Cocktail sauce. Horseradish. Ketchup and mustard. Meat flavorings and tenderizers. Bouillon cubes. Hot sauce. Tabasco sauce. Marinades. Taco seasonings. Relishes.  Fats and Oils Butter, stick margarine, lard, shortening and  bacon fat. Coconut, palm kernel, or palm oils. Regular salad dressings.  Pickles and olives. Salted popcorn and pretzels.  The items listed above may not be a complete list of foods and beverages to avoid.

## 2021-10-29 NOTE — Progress Notes (Signed)
Annual Screening/Preventative Visit & Comprehensive Evaluation &  Examination  Future Appointments  Date Time Provider Department  10/29/2021 11:00 AM [name], MD [facility]  03/12/2022  2:00 PM [name], NP [facility]  07/22/2022                    wellness 11:00 AM [name], NP [facility]  11/04/2022                   cpe 11:00 AM [name], MD [facility]        This very nice 81 y.o. WWF presents for a Screening /Preventative Visit & comprehensive evaluation and management of multiple medical co-morbidities.  Patient has been followed for labile HTN, ASHD /pAfib, Pulm HTN, OSA, HLD, Prediabetes  and Vitamin D Deficiency. Patient has hx/o GERD controlled on meds and also has hx/o Major Depression in remission on meds. Patient is followed by Dr Brett Fairy for her OSA on auto CPAP.         Patient's BP has been controlled at home and patient denies any cardiac symptoms as chest pain, palpitations, shortness of breath, dizziness or ankle swelling. Today's BP is at goal - 120/68 . Patient has hx of pAfib & is on Eliquis followed by Dr Harl Bowie.       Patient's hyperlipidemia is not controlled with diet and medications. Patient denies myalgias or other medication SE's. Last lipids were not at goal:  Lab Results  Component Value Date   CHOL 214 (H) 07/28/2021   HDL 72 07/28/2021   LDLCALC 121 (H) 07/28/2021   TRIG 104 07/28/2021   CHOLHDL 3.0 07/28/2021         Patient is moderately overweight  (BMI 31+) and is monitored expectantly for glucose intolerance.   Patient denies reactive hypoglycemic symptoms, visual blurring, diabetic polys or paresthesias. Last A1c was near goal :  Lab Results  Component Value Date   HGBA1C 5.9 (H) 04/03/2020         Finally, patient has history of Vitamin D Deficiency ("28" /2019) and last Vitamin D was at goal:  Lab Results  Component Value Date   VD25OH 68 04/03/2020     Current Outpatient Medications on File Prior to  Visit  Medication Sig   acetaminophen 500 MG tablet Take  every 6  hours as needed.   ELIQUIS 5 MG TABS tablet Take 1 tablet 2  times daily.   VITAMIN C 500 MG CAPS Take daily   CHOLECALCIFEROL 5,000 Units  Take daily.   cyanocobalamin 1000 MCG tablet Take daily.   cyclobenzaprine 10 MG tablet Take 1/2 to 1 tab 2 to 3 x /day if needed    diltiazem CD 300 MG 24 hr capsule Take 1 capsule  daily.   ferrous sulfate 325 (65 FE) MG tablet Take 1 tablet every other day.   lansoprazole 30 MG capsule TAKE 1 CAPSULE DAILY    Magnesium 250 MG TABS Take by mouth.   metoprolol tartrate  25 MG tablet TAKE 2 TABS  MORNING AND 1 TAB EVENING   NITROSTAT 0.4 MG SL tablet as needed for chest pain.   PARoxetine  20 MG tablet TAKE 1 TABLET DAILY FOR MOOD   PROBIOTIC DAILY PO) Take 1 tablet  daily.   METAMUCIL 58.6 % packet Take 1 packet daily.   zinc 50 MG tablet Take  daily.    Allergies  Allergen Reactions   Cephalosporins Rash    Past Medical History:  Diagnosis Date   Anxiety    Chronic lower back pain    Depression    DJD (degenerative joint disease)    knees, neck   GERD (gastroesophageal reflux disease)    has had dilitation in the past   Hypertension      Health Maintenance  Topic Date Due   Zoster Vaccines- Shingrix (1 of 2) Never done   COVID-19 Vaccine (4 - Booster for Moderna series) 05/27/2020   COLONOSCOPY  06/07/2020   INFLUENZA VACCINE  10/14/2020   DEXA SCAN  04/03/2021 (Originally 01/13/2005)   TETANUS/TDAP  04/03/2021 (Originally 01/14/1960)   PNA vac Low Risk Adult  Completed   HPV VACCINES  Aged Out    Immunization History  Administered Date(s) Administered   Fluad Quad(high Dose) 12/04/2019   Influenza, High Dose Seasonal PF 01/29/2018   Influenza 12/09/2016, 11/28/2018   Moderna Sars-Covid-2 Vacc 03/28/2019, 04/28/2019, 02/27/2020   Pneumococcal -13 02/12/2016, 09/30/2017   Pneumococcal -23 03/16/2006, 04/02/2008   Zoster, Live 07/31/2013     Last Colon  -  06/07/2017 - Dr Fuller Plan - adenomatous Polyps - Recc 3 yr f/u due Apr 2022                                                     ( Sent letter 07/30/20 to reschedule by Dr Fuller Plan )   Last EGD -   06/07/2017 - Dr Fuller Plan - dilated esophageal stricture  Last MGM - 03/13/2021   Past Surgical History:  Procedure Laterality Date   ABDOMINAL HYSTERECTOMY  03/1979   BREAST BIOPSY Left 1986 and 1987   CATARACT EXTRACTION, BILATERAL Bilateral 2014   Dr. Madelin Headings, in [place]     Family History  Problem Relation Age of Onset   Hypertension Mother    Heart disease Mother    Hypertension Father    Heart disease Father    Tremor Sister    Dementia Brother    Hypertension Daughter    Colon cancer Neg Hx    Esophageal cancer Neg Hx    Rectal cancer Neg Hx    Stomach cancer Neg Hx      Social History   Tobacco Use   Smoking status: Never   Smokeless tobacco: Never  Vaping Use   Vaping Use: Never used  Substance Use Topics   Alcohol use: Yes    Alcohol/week: 3.0 standard drinks    Types: 3 Standard drinks or equivalent per week    Comment: 3 nights out of the week   Drug use: No      ROS Constitutional: Denies fever, chills, weight loss/gain, headaches, insomnia,  night sweats, and change in appetite. Does c/o fatigue. Eyes: Denies redness, blurred vision, diplopia, discharge, itchy, watery eyes.  ENT: Denies discharge, congestion, post nasal drip, epistaxis, sore throat, earache, hearing loss, dental pain, Tinnitus, Vertigo, Sinus pain, snoring.  Cardio: Denies chest pain, palpitations, irregular heartbeat, syncope, dyspnea, diaphoresis, orthopnea, PND, claudication, edema Respiratory: denies cough, dyspnea, DOE, pleurisy, hoarseness, laryngitis, wheezing.  Gastrointestinal: Denies dysphagia, heartburn, reflux, water brash, pain, cramps, nausea, vomiting, bloating, diarrhea, constipation, hematemesis, melena, hematochezia, jaundice, hemorrhoids Genitourinary: Denies dysuria,  frequency, urgency, nocturia, hesitancy, discharge, hematuria, flank pain Breast: Breast lumps, nipple discharge, bleeding.  Musculoskeletal: Denies arthralgia, myalgia, stiffness, Jt. Swelling, pain, limp, and strain/sprain. Denies falls. Skin: Denies puritis, rash, hives,  warts, acne, eczema, changing in skin lesion Neuro: No weakness, tremor, incoordination, spasms, paresthesia, pain Psychiatric: Denies confusion, memory loss, sensory loss. Denies Depression. Endocrine: Denies change in weight, skin, hair change, nocturia, and paresthesia, diabetic polys, visual blurring, hyper / hypo glycemic episodes.  Heme/Lymph: No excessive bleeding, bruising, enlarged lymph nodes.  Physical Exam  BP 120/68   Pulse 60   Temp 97.9 F (36.6 C)   Resp 16   Ht '5\' 10"'$  (1.778 m)   Wt 229 lb 12.8 oz (104.2 kg)   SpO2 95%   BMI 32.97 kg/m   General Appearance: Well nourished, well groomed and in no apparent distress.  Eyes: PERRLA, EOMs, conjunctiva no swelling or erythema, normal fundi and vessels. Sinuses: No frontal/maxillary tenderness ENT/Mouth: EACs patent / TMs  nl. Nares clear without erythema, swelling, mucoid exudates. Oral hygiene is good. No erythema, swelling, or exudate. Tongue normal, non-obstructing. Tonsils not swollen or erythematous. Hearing normal.  Neck: Supple, thyroid not palpable. No bruits, nodes or JVD. Respiratory: Respiratory effort normal.  BS equal and clear bilateral without rales, rhonci, wheezing or stridor. Cardio: Heart sounds are normal with regular rate and rhythm and no murmurs, rubs or gallops. Peripheral pulses are normal and equal bilaterally without edema. No aortic or femoral bruits. Chest: symmetric with normal excursions and percussion. Breasts: Symmetric, without lumps, nipple discharge, retractions, or fibrocystic changes.  Abdomen: Flat, soft with bowel sounds active. Nontender, no guarding, rebound, hernias, masses, or organomegaly.  Lymphatics: Non  tender without lymphadenopathy.  Musculoskeletal: Full ROM all peripheral extremities, joint stability, 5/5 strength, and normal gait. Skin: Warm and dry without rashes, lesions, cyanosis, clubbing or  ecchymosis.  Neuro: Cranial nerves intact, reflexes equal bilaterally. Normal muscle tone, no cerebellar symptoms. Sensation intact.  Pysch: Alert and oriented X 3, normal affect, Insight and Judgment appropriate.    Assessment and Plan  1. Annual Preventative Screening Examination   2. Essential hypertension  - EKG 12-Lead - Urinalysis, Routine w reflex microscopic - Microalbumin / creatinine urine ratio - CBC with Differential/Platelet - COMPLETE METABOLIC PANEL WITH GFR - Magnesium - TSH  3. Hyperlipidemia, mixed  - EKG 12-Lead - Lipid panel - TSH - Insulin, random  4. Abnormal glucose  - EKG 12-Lead - Hemoglobin A1c - Insulin, random  5. Vitamin D deficiency  - VITAMIN D 25 Hydroxy   6. Paroxysmal atrial fibrillation (HCC)  - EKG 12-Lead - TSH  7. Pulmonary hypertension (HCC)  - EKG 12-Lead  8. Gastroesophageal reflux disease    9. Screening for heart disease  - EKG 12-Lead - CBC with Differential/Platelet  10. FHx: heart disease  - EKG 12-Lead  11. Medication management  - Urinalysis, Routine w reflex microscopic - Microalbumin / creatinine urine ratio - CBC with Differential/Platelet - COMPLETE METABOLIC PANEL WITH GFR - Magnesium - Lipid panel - TSH - Hemoglobin A1c - Insulin, random - VITAMIN D 25 Hydroxy   12. Screening for colorectal cancer  - POC Hemoccult Bld/Stl         Patient was counseled in prudent diet to achieve/maintain BMI less than 25 for weight control, BP monitoring, regular exercise and medications. Discussed med's effects and SE's. Screening labs and tests as requested with regular follow-up as recommended. Over 40 minutes of exam, counseling, chart review and high complex critical decision making was  performed.   Kirtland Bouchard, MD

## 2021-10-30 ENCOUNTER — Other Ambulatory Visit: Payer: Self-pay | Admitting: Internal Medicine

## 2021-10-30 DIAGNOSIS — N3000 Acute cystitis without hematuria: Secondary | ICD-10-CM

## 2021-10-30 LAB — URINALYSIS, ROUTINE W REFLEX MICROSCOPIC
Bilirubin Urine: NEGATIVE
Glucose, UA: NEGATIVE
Hgb urine dipstick: NEGATIVE
Ketones, ur: NEGATIVE
Nitrite: POSITIVE — AB
RBC / HPF: NONE SEEN /HPF (ref 0–2)
Specific Gravity, Urine: 1.023 (ref 1.001–1.035)
pH: 6 (ref 5.0–8.0)

## 2021-10-30 LAB — CBC WITH DIFFERENTIAL/PLATELET
Absolute Monocytes: 460 cells/uL (ref 200–950)
Basophils Absolute: 32 cells/uL (ref 0–200)
Basophils Relative: 0.5 %
Eosinophils Absolute: 50 cells/uL (ref 15–500)
Eosinophils Relative: 0.8 %
HCT: 41.1 % (ref 35.0–45.0)
Hemoglobin: 14.2 g/dL (ref 11.7–15.5)
Lymphs Abs: 1518 cells/uL (ref 850–3900)
MCH: 32.3 pg (ref 27.0–33.0)
MCHC: 34.5 g/dL (ref 32.0–36.0)
MCV: 93.4 fL (ref 80.0–100.0)
MPV: 11.6 fL (ref 7.5–12.5)
Monocytes Relative: 7.3 %
Neutro Abs: 4240 cells/uL (ref 1500–7800)
Neutrophils Relative %: 67.3 %
Platelets: 216 10*3/uL (ref 140–400)
RBC: 4.4 10*6/uL (ref 3.80–5.10)
RDW: 12.6 % (ref 11.0–15.0)
Total Lymphocyte: 24.1 %
WBC: 6.3 10*3/uL (ref 3.8–10.8)

## 2021-10-30 LAB — COMPLETE METABOLIC PANEL WITH GFR
AG Ratio: 2 (calc) (ref 1.0–2.5)
ALT: 10 U/L (ref 6–29)
AST: 13 U/L (ref 10–35)
Albumin: 4 g/dL (ref 3.6–5.1)
Alkaline phosphatase (APISO): 59 U/L (ref 37–153)
BUN: 14 mg/dL (ref 7–25)
CO2: 28 mmol/L (ref 20–32)
Calcium: 9.5 mg/dL (ref 8.6–10.4)
Chloride: 104 mmol/L (ref 98–110)
Creat: 0.82 mg/dL (ref 0.60–0.95)
Globulin: 2 g/dL (calc) (ref 1.9–3.7)
Glucose, Bld: 104 mg/dL — ABNORMAL HIGH (ref 65–99)
Potassium: 4.1 mmol/L (ref 3.5–5.3)
Sodium: 142 mmol/L (ref 135–146)
Total Bilirubin: 0.7 mg/dL (ref 0.2–1.2)
Total Protein: 6 g/dL — ABNORMAL LOW (ref 6.1–8.1)
eGFR: 72 mL/min/{1.73_m2} (ref >=60)

## 2021-10-30 LAB — VITAMIN D 25 HYDROXY (VIT D DEFICIENCY, FRACTURES): Vit D, 25-Hydroxy: 75 ng/mL (ref 30–100)

## 2021-10-30 LAB — LIPID PANEL
Cholesterol: 192 mg/dL (ref <200)
HDL: 68 mg/dL (ref >=50)
LDL Cholesterol (Calc): 107 mg/dL (calc) — ABNORMAL HIGH
Non-HDL Cholesterol (Calc): 124 mg/dL (calc) (ref <130)
Total CHOL/HDL Ratio: 2.8 (calc) (ref <5.0)
Triglycerides: 83 mg/dL (ref <150)

## 2021-10-30 LAB — MICROSCOPIC MESSAGE

## 2021-10-30 LAB — HEMOGLOBIN A1C
Hgb A1c MFr Bld: 5.6 % of total Hgb (ref <5.7)
Mean Plasma Glucose: 114 mg/dL
eAG (mmol/L): 6.3 mmol/L

## 2021-10-30 LAB — INSULIN, RANDOM: Insulin: 9.8 u[IU]/mL

## 2021-10-30 LAB — MICROALBUMIN / CREATININE URINE RATIO
Creatinine, Urine: 212 mg/dL (ref 20–275)
Microalb Creat Ratio: 13 mcg/mg creat (ref <30)
Microalb, Ur: 2.8 mg/dL

## 2021-10-30 LAB — MAGNESIUM: Magnesium: 1.9 mg/dL (ref 1.5–2.5)

## 2021-10-30 LAB — TSH: TSH: 1.68 mIU/L (ref 0.40–4.50)

## 2021-10-30 NOTE — Progress Notes (Signed)
<><><><><><><><><><><><><><><><><><><><><><><><><><><><><><><><><> <><><><><><><><><><><><><><><><><><><><><><><><><><><><><><><><><>  -    U/A suspicious - will check U/C - may take 2 -4 days to get results back  <><><><><><><><><><><><><><><><><><><><><><><><><><><><><><><><><>  -  Total Chol = 192 - sl high          (  Ideal or Gal is less than 180  !  ) & Bad LDL Chol = 107 - too high           (  Ideal or Goal is less than 70  ! )   - Recommend a stricter low cholesterol diet   - Cholesterol only comes from animal sources  - ie. meat, dairy, egg yolks  - Eat all the vegetables you want.  - Avoid Meat, Avoid Meat,  Avoid Meat - especially Red Meat - Beef AND Pork .  - Avoid cheese & dairy - milk & ice cream.     - Cheese is the most concentrated form of trans-fats which  is the worst thing to clog up our arteries.   - Veggie cheese is OK which can be found in the fresh  produce section at [employer] or [employer] or [employer] <><><><><><><><><><><><><><><><><><><><><><><><><><><><><><><><><>  -  A1c = 5.6 %  - Much Better - back in Normal Non Diabetic Range - Great  ! <><><><><><><><><><><><><><><><><><><><><><><><><><><><><><><><><>  -  Vit D = 75 - Excellent  - Please keep dose same   <><><><><><><><><><><><><><><><><><><><><><><><><><><><><><><><><>  -  All Else - CBC - Kidneys - Electrolytes - Liver - Magnesium & Thyroid    - all  Normal / OK <><><><><><><><><><><><><><><><><><><><><><><><><><><><><><><><><> <><><><><><><><><><><><><><><><><><><><><><><><><><><><><><><><><>

## 2021-11-03 ENCOUNTER — Other Ambulatory Visit: Payer: Medicare Other

## 2021-11-03 DIAGNOSIS — N3000 Acute cystitis without hematuria: Secondary | ICD-10-CM | POA: Diagnosis not present

## 2021-11-05 DIAGNOSIS — J40 Bronchitis, not specified as acute or chronic: Secondary | ICD-10-CM | POA: Diagnosis not present

## 2021-11-06 ENCOUNTER — Other Ambulatory Visit: Payer: Self-pay | Admitting: Internal Medicine

## 2021-11-06 DIAGNOSIS — N3000 Acute cystitis without hematuria: Secondary | ICD-10-CM

## 2021-11-06 LAB — URINE CULTURE
MICRO NUMBER:: 13808205
SPECIMEN QUALITY:: ADEQUATE

## 2021-11-06 MED ORDER — SULFAMETHOXAZOLE-TRIMETHOPRIM 800-160 MG PO TABS: ORAL_TABLET | ORAL | 0 refills | Status: DC

## 2021-11-06 NOTE — Progress Notes (Signed)
<><><><><><><><><><><><><><><><><><><><><><><><><><><><><><><><><> <><><><><><><><><><><><><><><><><><><><><><><><><><><><><><><><><>  -   U/C is (+) for UTI   - Rx sent to [facility] store   - Please call office to schedule a Nurse Visit between                                                                                              Sep 18  to Sept 29                                                               to recheck urine to assure infection cleared up .   <><><><><><><><><><><><><><><><><><><><><><><><><><><><><><><><><> <><><><><><><><><><><><><><><><><><><><><><><><><><><><><><><><><>

## 2021-11-13 ENCOUNTER — Encounter: Payer: Medicare Other | Admitting: *Deleted

## 2021-11-19 DIAGNOSIS — H353132 Nonexudative age-related macular degeneration, bilateral, intermediate dry stage: Secondary | ICD-10-CM | POA: Diagnosis not present

## 2021-11-19 DIAGNOSIS — H26493 Other secondary cataract, bilateral: Secondary | ICD-10-CM | POA: Diagnosis not present

## 2021-11-19 DIAGNOSIS — Z961 Presence of intraocular lens: Secondary | ICD-10-CM | POA: Diagnosis not present

## 2021-11-19 DIAGNOSIS — H43813 Vitreous degeneration, bilateral: Secondary | ICD-10-CM | POA: Diagnosis not present

## 2021-11-20 DIAGNOSIS — J40 Bronchitis, not specified as acute or chronic: Secondary | ICD-10-CM | POA: Diagnosis not present

## 2021-11-29 IMAGING — DX DG CHEST 1V PORT
1 series · 1 of 1 positions shown · non-contrast
Comparison: 03/31/2017

CLINICAL DATA: Shortness of breath for 1 week. Nausea. Dizziness
and weakness.

EXAM:
PORTABLE CHEST 1 VIEW

[chest ap]
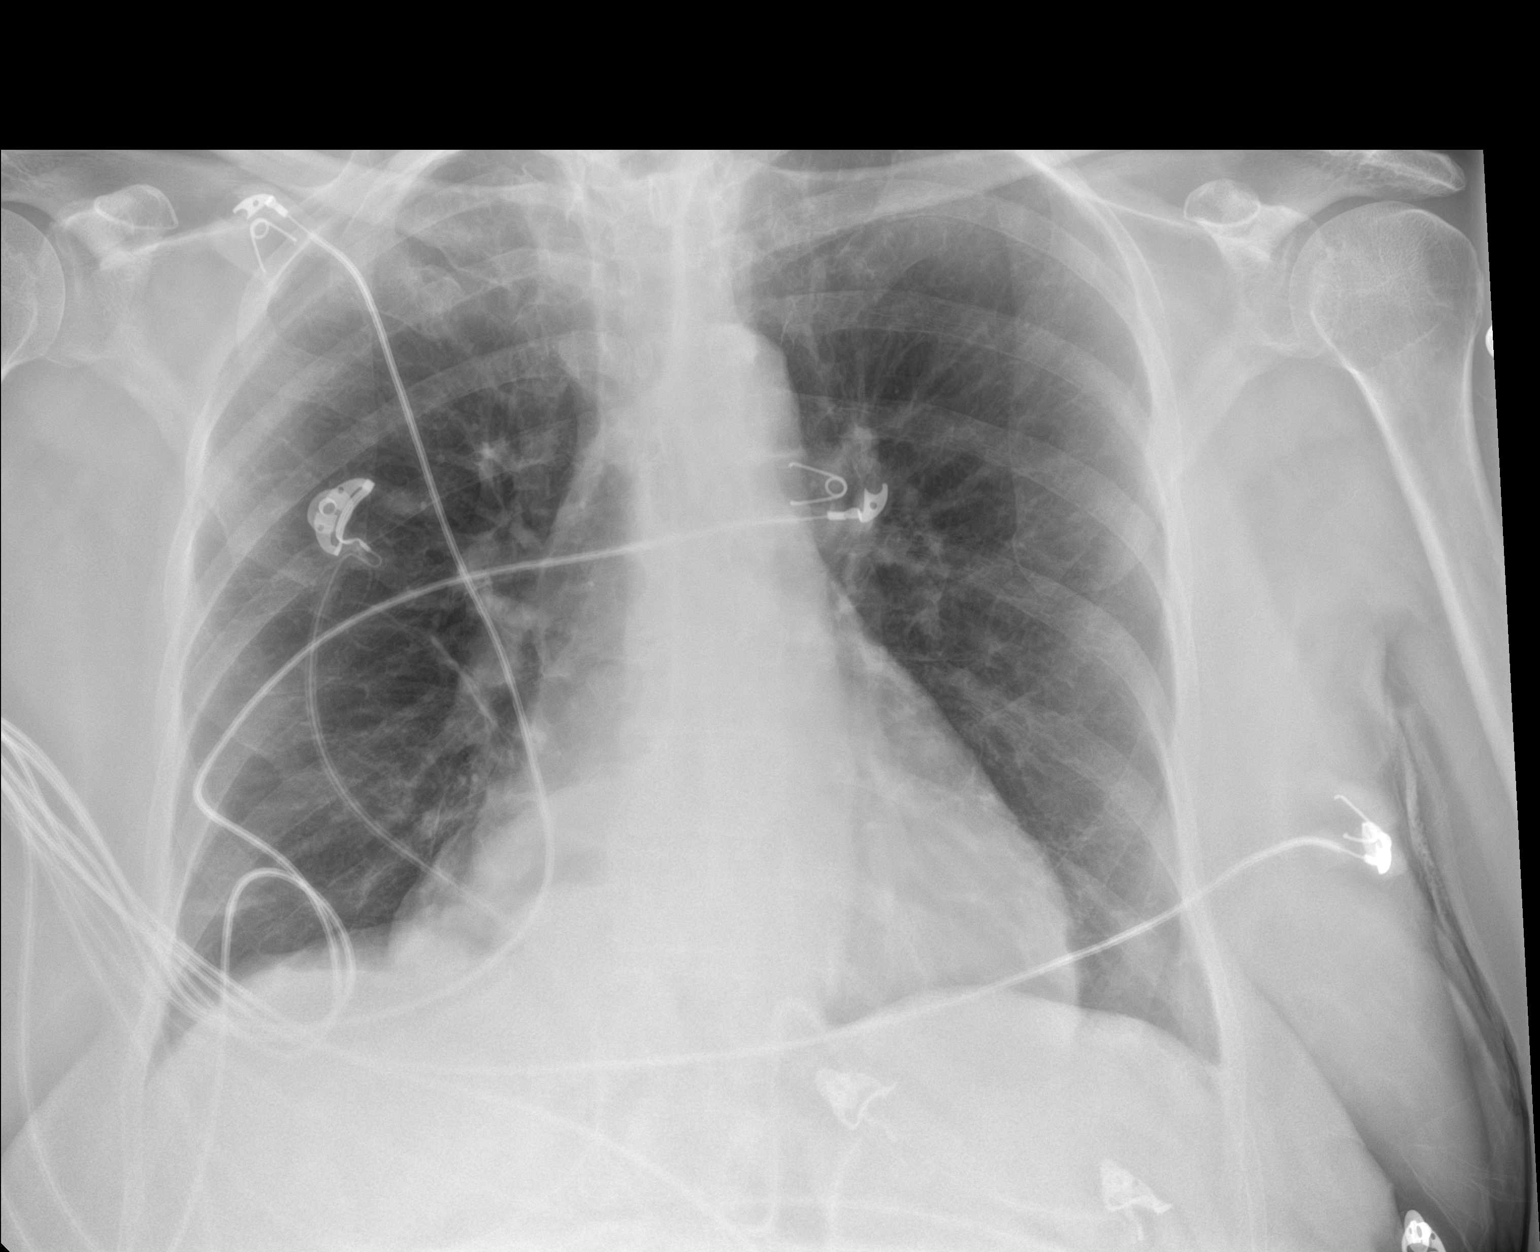

[1 of 1 positions shown; findings below may reference images not displayed]

FINDINGS: The heart size and mediastinal contours are within normal limits.
Both lungs are clear. Increased size of small to moderate hiatal
hernia is seen in the right cardiophrenic angle.
IMPRESSION: 1. No active cardiopulmonary disease.
2. Increased size of small to moderate hiatal hernia.

## 2021-12-01 NOTE — Progress Notes (Unsigned)
Future Appointments  Date Time Provider Department  12/02/2021 11:30 AM [name], MD [facility]  02/03/2022                3 mo ov 11:00 AM Alycia Rossetti, NP [facility]  03/12/2022  2:00 PM [name], NP [facility]  05/12/2022                6 mo  ov 10:30 AM [name], MD [facility]  08/11/2022                wellness 11:00 AM Alycia Rossetti, NP [facility]  11/12/2022                cpe 11:00 AM [name], MD [facility]    History of Present Illness:          This very nice 81 y.o. [place]  with labile HTN, ASHD /pAfib, Pulm HTN, OSA, HLD, Prediabetes, GERD  and Vitamin D Deficiency returns for 1 mo F/U  of E.coli UTI tx'd with SXT. All other labs at recent CPE were improved except sl elevated LDL Chol .   Medications    diltiazem CD) 300 MG  Take 1 capsule  daily   metoprolol tartrate  25 MG , TAKE 2 TABLETS  IN  T MORNING & 1 TABLET IN EVENING   NITROSTAT 0.4 MG ,  as needed for chest pain.   DUONEB 0.5-2.5 (3) MG/3ML SOLN, Take 3 mLs by neb every 4  hours as needed. Max:6 doses per day   acetaminophen  500 MG tablet, Take  every 6 hours as needed.   apixaban (ELIQUIS) 5 MG TABS , Take 1 tablet 2 times daily.   Cyanocobalamin 2500 MCG TABS, Take  daily.   ferrous sulfate 325 (65 FE) MG , Take 1 tablet  every other day.   VITAMIN C 500 MG , Take daily   busPIRone 10 MG tablet, Take  1/2 to 1 tablet  3 x /day  as needed for Mood, Anxiety or irritability   CHOLECALCIFEROL , Take 5,000 Units  daily.   cyclobenzaprine 10 MG tablet, Take 1/2 to 1 tablet    2 to 3 x /day if needed for Muscle Spasms   escitalopram (LEXAPRO) 20 MG tablet, Take 1 tablet Daily for Mood, Anxiety & Irritability   lansoprazole (PREVACID) 30 MG capsule, TAKE 1 CAPSULE   DAILY FOR    Magnesium 250 MG , Take  daily.   PARoxetine  20 MG tablet, TAKE 1 TABLET  DAILY FOR MOOD   PROBIOTIC DAILY , Take 1 tablet  daily.   psyllium  58.6 % packet, Take 1 packet  daily.   zinc gluconate 50 MG  tablet, Take daily.  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  HEENT - WNL. Neck - supple.  Chest - Clear equal BS. Cor - Nl HS. RRR w/o sig MGR. PP 1(+). No edema. MS- FROM w/o deformities.  Gait Nl. Neuro -  Nl w/o focal abnormalities.   Assessment and Plan:      Follow Up Instructions:        I discussed the assessment and treatment plan with the patient. The patient was provided an opportunity to ask questions and all were answered. The patient agreed with the plan and demonstrated an understanding of the instructions.       The patient was advised to call back or seek an in-person evaluation if the symptoms worsen or if the condition fails to improve as anticipated.    Kirtland Bouchard, MD

## 2021-12-02 ENCOUNTER — Encounter: Payer: Self-pay | Admitting: Internal Medicine

## 2021-12-02 ENCOUNTER — Ambulatory Visit (INDEPENDENT_AMBULATORY_CARE_PROVIDER_SITE_OTHER): Payer: Medicare Other | Admitting: Internal Medicine

## 2021-12-02 VITALS — BP 108/62 | HR 72 | Temp 97.3°F | Ht 70.0 in | Wt 233.8 lb

## 2021-12-02 DIAGNOSIS — N3000 Acute cystitis without hematuria: Secondary | ICD-10-CM

## 2021-12-02 DIAGNOSIS — N3281 Overactive bladder: Secondary | ICD-10-CM | POA: Diagnosis not present

## 2021-12-02 MED ORDER — OXYBUTYNIN CHLORIDE ER 10 MG PO TB24: ORAL_TABLET | ORAL | 3 refills | Status: DC

## 2021-12-02 NOTE — Patient Instructions (Signed)
Urinary Tract Infection, Adult  A urinary tract infection (UTI) is an infection of any part of the urinary tract. The urinary tract includes the kidneys, ureters, bladder, and urethra. These organs make, store, and get rid of urine in the body. An upper UTI affects the ureters and kidneys. A lower UTI affects the bladder and urethra. What are the causes? Most urinary tract infections are caused by bacteria in your genital area around your urethra, where urine leaves your body. These bacteria grow and cause inflammation of your urinary tract. What increases the risk? You are more likely to develop this condition if: You have a urinary catheter that stays in place. You are not able to control when you urinate or have a bowel movement (incontinence). You are female and you: Use a spermicide or diaphragm for birth control. Have low estrogen levels. Are pregnant. You have certain genes that increase your risk. You are sexually active. You take antibiotic medicines. You have a condition that causes your flow of urine to slow down, such as: An enlarged prostate, if you are female. Blockage in your urethra. A kidney stone. A nerve condition that affects your bladder control (neurogenic bladder). Not getting enough to drink, or not urinating often. You have certain medical conditions, such as: Diabetes. A weak disease-fighting system (immunesystem). Sickle cell disease. Gout. Spinal cord injury. What are the signs or symptoms? Symptoms of this condition include: Needing to urinate right away (urgency). Frequent urination. This may include small amounts of urine each time you urinate. Pain or burning with urination. Blood in the urine. Urine that smells bad or unusual. Trouble urinating. Cloudy urine. Vaginal discharge, if you are female. Pain in the abdomen or the lower back. You may also have: Vomiting or a decreased appetite. Confusion. Irritability or tiredness. A fever or  chills. Diarrhea. The first symptom in older adults may be confusion. In some cases, they may not have any symptoms until the infection has worsened. How is this diagnosed? This condition is diagnosed based on your medical history and a physical exam. You may also have other tests, including: Urine tests. Blood tests. Tests for STIs (sexually transmitted infections). If you have had more than one UTI, a cystoscopy or imaging studies may be done to determine the cause of the infections. How is this treated? Treatment for this condition includes: Antibiotic medicine. Over-the-counter medicines to treat discomfort. Drinking enough water to stay hydrated. If you have frequent infections or have other conditions such as a kidney stone, you may need to see a health care provider who specializes in the urinary tract (urologist). In rare cases, urinary tract infections can cause sepsis. Sepsis is a life-threatening condition that occurs when the body responds to an infection. Sepsis is treated in the hospital with IV antibiotics, fluids, and other medicines. Follow these instructions at home:  Medicines Take over-the-counter and prescription medicines only as told by your health care provider. If you were prescribed an antibiotic medicine, take it as told by your health care provider. Do not stop using the antibiotic even if you start to feel better. General instructions Make sure you: Empty your bladder often and completely. Do not hold urine for long periods of time. Empty your bladder after sex. Wipe from front to back after urinating or having a bowel movement if you are female. Use each tissue only one time when you wipe. Drink enough fluid to keep your urine pale yellow. Keep all follow-up visits. This is important. Contact a health   care provider if: Your symptoms do not get better after 1-2 days. Your symptoms go away and then return. Get help right away if: You have severe pain in  your back or your lower abdomen. You have a fever or chills. You have nausea or vomiting. Summary A urinary tract infection (UTI) is an infection of any part of the urinary tract, which includes the kidneys, ureters, bladder, and urethra. Most urinary tract infections are caused by bacteria in your genital area. Treatment for this condition often includes antibiotic medicines. If you were prescribed an antibiotic medicine, take it as told by your health care provider. Do not stop using the antibiotic even if you start to feel better. Keep all follow-up visits. This is important. This information is not intended to replace advice given to you by your health care provider. Make sure you discuss any questions you have with your health care provider. Document Revised: 10/13/2019 Document Reviewed: 10/13/2019 Elsevier Patient Education  2023 [employer]  

## 2021-12-03 DIAGNOSIS — G4733 Obstructive sleep apnea (adult) (pediatric): Secondary | ICD-10-CM | POA: Diagnosis not present

## 2021-12-04 LAB — URINALYSIS, ROUTINE W REFLEX MICROSCOPIC
Bilirubin Urine: NEGATIVE
Glucose, UA: NEGATIVE
Hgb urine dipstick: NEGATIVE
Nitrite: NEGATIVE
Specific Gravity, Urine: 1.025 (ref 1.001–1.035)
pH: 5.5 (ref 5.0–8.0)

## 2021-12-04 LAB — URINE CULTURE
MICRO NUMBER:: 13938694
SPECIMEN QUALITY:: ADEQUATE

## 2021-12-04 LAB — MICROSCOPIC MESSAGE

## 2021-12-05 ENCOUNTER — Telehealth: Payer: Self-pay | Admitting: Pharmacist

## 2021-12-05 NOTE — Progress Notes (Signed)
[facility])  [place] Team    12/05/2021  THANYA CEGIELSKI Mar 22, 1940 [id number]  Reason for referral: Medication Assistance with Eliquis   Referral source:  Self referral from patient  Current insurance: [facility]  PMHx includes but not limited to:  Atrial fibrillation with RVR   Outreach:  Successful telephone call with Ms. Volkert daughter, [name].  HIPAA identifiers verified.   Subjective:  Patient's daughter, "[name]" reached out to [facility] regarding medication assistance for Ms. Sow's Eliquis as [facility] assisted patient in 2022. Per report of daughter, patient is in the Medicare coverage gap.   Objective:  Lab Results  Component Value Date   CREATININE 0.82 10/29/2021   CREATININE 0.71 07/28/2021   CREATININE 0.89 04/23/2021    Lab Results  Component Value Date   HGBA1C 5.6 10/29/2021    Lipid Panel     Component Value Date/Time   CHOL 192 10/29/2021 1047   TRIG 83 10/29/2021 1047   HDL 68 10/29/2021 1047   CHOLHDL 2.8 10/29/2021 1047   VLDL 20 04/16/2019 0435   LDLCALC 107 (H) 10/29/2021 1047    BP Readings from Last 3 Encounters:  12/02/21 108/62  10/29/21 120/68  09/29/21 110/70    Allergies  Allergen Reactions   Cephalosporins Rash   Assessment: Spoke with Ms. Wedeking's daughter, reviewed requirements for [place] medication assistance program for Eliquis. [name] will pick up the 2023 out of pocket spend tomorrow. I advised [name] on how to obtain a copy of the Medicare benefits letter from the my social security.gov   Medication Assistance Findings:  Medication assistance needs identified: Eliquis   Extra Help:  Not eligible for Extra Help Low Income Subsidy based on reported income and assets  Patient Assistance Programs: Eliquis made by [place] requirement met: Unknown, [facility] assisted patient in 2022.  Out-of-pocket prescription expenditure met:   Unknown Daughter to pick  up OOP expense report Reviewed program requirements with patient.   Plan: I will route patient assistance letter to [facility] technician who will coordinate patient assistance program application process for medications listed above.  [facility] pharmacy technician will assist with obtaining all required documents from both patient and provider(s) and submit application(s) once completed.    Loretha Brasil, PharmD [place] Pharmacist Office: [phone]

## 2021-12-06 DIAGNOSIS — J40 Bronchitis, not specified as acute or chronic: Secondary | ICD-10-CM | POA: Diagnosis not present

## 2021-12-08 ENCOUNTER — Other Ambulatory Visit: Payer: Self-pay | Admitting: Internal Medicine

## 2021-12-08 DIAGNOSIS — J40 Bronchitis, not specified as acute or chronic: Secondary | ICD-10-CM

## 2021-12-10 ENCOUNTER — Telehealth: Payer: Self-pay | Admitting: Pharmacy Technician

## 2021-12-10 DIAGNOSIS — Z596 Low income: Secondary | ICD-10-CM

## 2021-12-10 NOTE — Progress Notes (Signed)
[facility])                                            [place] Team    12/10/2021  Darlene [name] [birth year] [id number]                                      Medication Assistance Referral  Referral From: [place]  Medication/Company: Eliquis / [employer] Patient application portion:  Mailed Provider application portion: Faxed  to Dr. Carlyle Dolly Provider address/fax verified via: Office website    [name] P. Deysi Soldo, [place]  [phone]

## 2021-12-17 ENCOUNTER — Other Ambulatory Visit: Payer: Self-pay

## 2021-12-17 DIAGNOSIS — K2100 Gastro-esophageal reflux disease with esophagitis, without bleeding: Secondary | ICD-10-CM

## 2021-12-17 MED ORDER — LANSOPRAZOLE 30 MG PO CPDR: DELAYED_RELEASE_CAPSULE | ORAL | 3 refills | Status: DC

## 2021-12-19 ENCOUNTER — Other Ambulatory Visit: Payer: Self-pay | Admitting: Internal Medicine

## 2021-12-19 DIAGNOSIS — F419 Anxiety disorder, unspecified: Secondary | ICD-10-CM

## 2021-12-29 ENCOUNTER — Other Ambulatory Visit: Payer: Self-pay | Admitting: Internal Medicine

## 2021-12-29 DIAGNOSIS — H26491 Other secondary cataract, right eye: Secondary | ICD-10-CM | POA: Diagnosis not present

## 2021-12-29 DIAGNOSIS — F3342 Major depressive disorder, recurrent, in full remission: Secondary | ICD-10-CM

## 2021-12-29 MED ORDER — PAROXETINE HCL 20 MG PO TABS: ORAL_TABLET | ORAL | 3 refills | Status: DC

## 2021-12-30 ENCOUNTER — Other Ambulatory Visit: Payer: Self-pay

## 2021-12-30 DIAGNOSIS — F419 Anxiety disorder, unspecified: Secondary | ICD-10-CM

## 2021-12-30 DIAGNOSIS — N3281 Overactive bladder: Secondary | ICD-10-CM

## 2021-12-30 MED ORDER — BUSPIRONE HCL 10 MG PO TABS: ORAL_TABLET | ORAL | 0 refills | Status: DC

## 2021-12-30 MED ORDER — OXYBUTYNIN CHLORIDE ER 10 MG PO TB24: ORAL_TABLET | ORAL | 3 refills | Status: DC

## 2021-12-30 MED ORDER — ESCITALOPRAM OXALATE 20 MG PO TABS: ORAL_TABLET | ORAL | 3 refills | Status: DC

## 2021-12-31 ENCOUNTER — Telehealth: Payer: Self-pay

## 2021-12-31 MED ORDER — DILTIAZEM HCL ER COATED BEADS 300 MG PO CP24: 300.0000 mg | ORAL_CAPSULE | Freq: Every day | ORAL | 3 refills | Status: DC

## 2021-12-31 NOTE — Telephone Encounter (Signed)
Medication refill for Diltiazem approved and sent to pharmacy.

## 2022-01-05 ENCOUNTER — Ambulatory Visit: Payer: Medicare Other | Admitting: Nurse Practitioner

## 2022-01-05 ENCOUNTER — Encounter: Payer: Self-pay | Admitting: Nurse Practitioner

## 2022-01-05 VITALS — BP 120/80 | HR 81

## 2022-01-05 DIAGNOSIS — R051 Acute cough: Secondary | ICD-10-CM | POA: Diagnosis not present

## 2022-01-05 DIAGNOSIS — U071 COVID-19: Secondary | ICD-10-CM | POA: Diagnosis not present

## 2022-01-05 MED ORDER — MOLNUPIRAVIR 200 MG PO CAPS: 4.0000 | ORAL_CAPSULE | Freq: Two times a day (BID) | ORAL | 0 refills | Status: AC

## 2022-01-05 MED ORDER — PROMETHAZINE-DM 6.25-15 MG/5ML PO SYRP: 2.5000 mL | ORAL_SOLUTION | Freq: Four times a day (QID) | ORAL | 0 refills | Status: DC | PRN

## 2022-01-05 NOTE — Progress Notes (Signed)
THIS ENCOUNTER IS A VIRTUAL VISIT DUE TO COVID-19 - PATIENT WAS NOT SEEN IN THE OFFICE.  PATIENT HAS CONSENTED TO VIRTUAL VISIT / TELEMEDICINE VISIT   Virtual Visit via telephone Note  I connected with  Darlene [name] on 01/05/2022 by telephone.  I verified that I am speaking with the correct person using two identifiers.    I discussed the limitations of evaluation and management by telemedicine and the availability of in person appointments. The patient expressed understanding and agreed to proceed.  History of Present Illness:  BP 120/80   Pulse 81   SpO2 96%  81 y.o. patient contacted office reporting URI sx fever, headache, body aches, SOB, congestion, cough, sore throat that started 2 days ago. Daughter is currently Covid + and is under treatment with Molnupiravir.  Patient tested positive by home test today. OV was conducted by telephone to minimize exposure. This patient has vaccinated for covid 19.  She has been taking Tylenol and using nasal spray.  Sx began 2 days ago with body aches  Treatments tried so far: Tylenol and nasal spray.  Exposures: Daughter   Medications   Current Outpatient Medications (Cardiovascular):    diltiazem (CARDIZEM CD) 300 MG 24 hr capsule, Take 1 capsule (300 mg total) by mouth daily.   metoprolol tartrate (LOPRESSOR) 25 MG tablet, TAKE 2 TABLETS BY MOUTH IN  THE MORNING AND 1 TABLET IN THE EVENING   nitroGLYCERIN (NITROSTAT) 0.4 MG SL tablet, Place 1 tablet (0.4 mg total) under the tongue every 5 (five) minutes as needed for chest pain.  Current Outpatient Medications (Respiratory):    ipratropium-albuterol (DUONEB) 0.5-2.5 (3) MG/3ML SOLN, USE 1 VIAL IN NEBULIZER EVERY 4 HOURS   promethazine-dextromethorphan (PROMETHAZINE-DM) 6.25-15 MG/5ML syrup, Take 2.5 mLs by mouth 4 (four) times daily as needed for cough.  Current Outpatient Medications (Analgesics):    acetaminophen (TYLENOL) 500 MG tablet, Take 500 mg by mouth every 6 (six) hours as  needed.  Current Outpatient Medications (Hematological):    apixaban (ELIQUIS) 5 MG TABS tablet, Take 1 tablet (5 mg total) by mouth 2 (two) times daily.   Cyanocobalamin 2500 MCG TABS, Take 2,500 mcg by mouth daily.   ferrous sulfate 325 (65 FE) MG tablet, Take 1 tablet (325 mg total) by mouth every other day.  Current Outpatient Medications (Other):    Ascorbic Acid (VITAMIN C) 500 MG CAPS, Take 500 mg by mouth.    busPIRone (BUSPAR) 10 MG tablet, TAKE 1/2 TO 1 (ONE-HALF TO ONE) TABLET BY MOUTH THREE TIMES DAILY AS NEEDED FOR ANXIETY, MOOD OR IRRITABILITY   CHOLECALCIFEROL PO, Take 5,000 Units by mouth daily.   cyclobenzaprine (FLEXERIL) 10 MG tablet, Take 1/2 to 1 tablet    2 to 3 x /day if needed for Muscle Spasms   escitalopram (LEXAPRO) 20 MG tablet, Take 1 tablet Daily for Mood, Anxiety & Irritability   lansoprazole (PREVACID) 30 MG capsule, TAKE 1 CAPSULE BY MOUTH  DAILY FOR HEARTBURN AND  INDIGESTION   Magnesium 250 MG TABS, Take by mouth daily.   molnupiravir EUA (LAGEVRIO) 200 MG CAPS capsule, Take 4 capsules (800 mg total) by mouth 2 (two) times daily for 5 days.   oxybutynin (DITROPAN-XL) 10 MG 24 hr tablet, Take 1 tablet  Daily for Bladder Control   PARoxetine (PAXIL) 20 MG tablet, Take  1 tablet  Daily for Mood                                                   /  TAKE                                        BY                                    MOUTH   Probiotic Product (PROBIOTIC DAILY PO), Take 1 tablet by mouth daily.   psyllium (METAMUCIL) 58.6 % packet, Take 1 packet by mouth daily.   zinc gluconate 50 MG tablet, Take 50 mg by mouth daily.  Allergies:  Allergies  Allergen Reactions   Cephalosporins Rash    Problem list She has DJD (degenerative joint disease); Chronic lower back pain; Depression; GERD (gastroesophageal reflux disease); Morbid obesity ([place]) - BMI 30+ with OSA; History of colon polyps; Abnormal glucose; Hyperlipidemia,  mixed; Vitamin D deficiency; Continuous leakage of urine; Esophageal stricture; Iron deficiency anemia; Atrial fibrillation with RVR ([place]); Hypoxemia requiring supplemental oxygen; LVH (left ventricular hypertrophy) due to hypertensive disease, without heart failure; Severe obstructive sleep apnea-hypopnea syndrome; Central sleep apnea associated with atrial fibrillation ([place]); Pulmonary hypertension ([place]); Physical deconditioning; and At moderate risk for fall on their problem list.   Social History:   reports that she has never smoked. She has never used smokeless tobacco. She reports current alcohol use of about 3.0 standard drinks of alcohol per week. She reports that she does not use drugs.  Observations/Objective:  General : Well sounding patient in no apparent distress HEENT: no hoarseness, no cough for duration of visit Lungs: speaks in complete sentences, no audible wheezing, no apparent distress Neurological: alert, oriented x 3 Psychiatric: pleasant, judgement appropriate   Assessment and Plan:   COVID-19  Covid 19 positive per rapid screening test in patient home Risk factors include: Atrial fibrillation.  On Eliquis 5 mg daily. Symptoms are: moderate Due to co morbid conditions and risk factors, discussed antivirals Molnupiravir Immue support reviewed  Take tylenol PRN temp 101+ Push hydration Regular ambulation or calf exercises exercises for clot prevention and 81 mg ASA unless contraindicated Sx supportive therapy suggested Follow up via mychart or telephone if needed Advised patient obtain O2 monitor; present to ED if persistently <90% or with severe dyspnea, CP, fever uncontrolled by tylenol, confusion, sudden decline Should remain in isolation until at least 5 days from onset of sx, 24-48 hours fever free without tylenol, sx such as cough are improved.   - molnupiravir EUA (LAGEVRIO) 200 MG CAPS capsule; Take 4 capsules (800 mg total) by mouth 2 (two) times daily for  5 days.  Dispense: 40 capsule; Refill: 0 - promethazine-dextromethorphan (PROMETHAZINE-DM) 6.25-15 MG/5ML syrup; Take 2.5 mLs by mouth 4 (four) times daily as needed for cough.  Dispense: 240 mL; Refill: 0  Acute cough Stay well hydrated. Report to ER or call 911 for any increase in difficulty breathing.  - promethazine-dextromethorphan (PROMETHAZINE-DM) 6.25-15 MG/5ML syrup; Take 2.5 mLs by mouth 4 (four) times daily as needed for cough.  Dispense: 240 mL; Refill: 0  Meds ordered this encounter  Medications   molnupiravir EUA (LAGEVRIO) 200 MG CAPS capsule    Sig: Take 4 capsules (800 mg total) by mouth 2 (two) times daily for 5 days.    Dispense:  40 capsule    Refill:  0    Order Specific Question:   Supervising Provider    Answer:  [name] [[id number]]   promethazine-dextromethorphan (PROMETHAZINE-DM) 6.25-15 MG/5ML syrup    Sig: Take 2.5 mLs by mouth 4 (four) times daily as needed for cough.    Dispense:  240 mL    Refill:  0    Order Specific Question:   Supervising Provider    Answer:   [name] [phone]     Follow Up Instructions:  I discussed the assessment and treatment plan with the patient. The patient was provided an opportunity to ask questions and all were answered. The patient agreed with the plan and demonstrated an understanding of the instructions.   The patient was advised to call back or seek an in-person evaluation if the symptoms worsen or if the condition fails to improve as anticipated.  I provided 15 minutes of non-face-to-face time during this encounter.   Darrol Jump, NP

## 2022-01-07 ENCOUNTER — Telehealth: Payer: Self-pay | Admitting: Nurse Practitioner

## 2022-01-07 ENCOUNTER — Other Ambulatory Visit: Payer: Self-pay | Admitting: Nurse Practitioner

## 2022-01-07 MED ORDER — PREDNISONE 10 MG PO TABS: ORAL_TABLET | ORAL | 0 refills | Status: DC

## 2022-01-07 NOTE — Telephone Encounter (Signed)
Notified about the Prednisone being sent in

## 2022-01-07 NOTE — Telephone Encounter (Signed)
Pt's daughter isn't noticing much if any improvement on her mom, says her chest is more congested and she is struggling with getting any of the phlegm out. The pt has been using nebulizer but still having issues with cough. Wanting to know if she can get prednisone or something stronger

## 2022-01-13 ENCOUNTER — Telehealth: Payer: Self-pay | Admitting: Nurse Practitioner

## 2022-01-13 ENCOUNTER — Other Ambulatory Visit: Payer: Self-pay | Admitting: Nurse Practitioner

## 2022-01-13 DIAGNOSIS — J988 Other specified respiratory disorders: Secondary | ICD-10-CM

## 2022-01-13 DIAGNOSIS — R051 Acute cough: Secondary | ICD-10-CM

## 2022-01-13 DIAGNOSIS — R5383 Other fatigue: Secondary | ICD-10-CM

## 2022-01-13 NOTE — Telephone Encounter (Signed)
Pt has not improved with cough, daughter(Cynthia) is concerned about poss pneumonia. Wanting to send her for a CXR

## 2022-01-14 ENCOUNTER — Ambulatory Visit (HOSPITAL_COMMUNITY)
Admission: RE | Admit: 2022-01-14 | Discharge: 2022-01-14 | Disposition: A | Discharge status: Home / Self Care | Payer: Medicare Other | Source: Ambulatory Visit | Attending: Nurse Practitioner | Admitting: Nurse Practitioner

## 2022-01-14 ENCOUNTER — Other Ambulatory Visit: Payer: Self-pay

## 2022-01-14 DIAGNOSIS — J988 Other specified respiratory disorders: Secondary | ICD-10-CM

## 2022-01-14 DIAGNOSIS — R5383 Other fatigue: Secondary | ICD-10-CM | POA: Diagnosis not present

## 2022-01-14 DIAGNOSIS — R051 Acute cough: Secondary | ICD-10-CM | POA: Diagnosis not present

## 2022-01-14 DIAGNOSIS — R059 Cough, unspecified: Secondary | ICD-10-CM | POA: Diagnosis not present

## 2022-01-14 DIAGNOSIS — K449 Diaphragmatic hernia without obstruction or gangrene: Secondary | ICD-10-CM | POA: Diagnosis not present

## 2022-01-15 ENCOUNTER — Other Ambulatory Visit: Payer: Self-pay | Admitting: Nurse Practitioner

## 2022-01-15 MED ORDER — AZITHROMYCIN 500 MG PO TABS: 500.0000 mg | ORAL_TABLET | Freq: Every day | ORAL | 0 refills | Status: AC

## 2022-01-21 ENCOUNTER — Telehealth: Payer: Self-pay | Admitting: Pharmacy Technician

## 2022-01-21 DIAGNOSIS — Z596 Low income: Secondary | ICD-10-CM

## 2022-01-21 NOTE — Progress Notes (Signed)
[facility])                                            [place] Team    01/21/2022  Darlene [name] [birth year] [id number]  Received both patient and provider portion(s) of patient assistance application(s) for Eliquis. Faxed completed application and required documents into [employer].    Pattricia Weiher P. Kooper Godshall, [place]  [phone]

## 2022-01-23 ENCOUNTER — Telehealth: Payer: Self-pay | Admitting: Pharmacy Technician

## 2022-01-23 DIAGNOSIS — Z596 Low income: Secondary | ICD-10-CM

## 2022-01-23 NOTE — Progress Notes (Signed)
[facility])                                            [place] Team    01/23/2022  Darlene [name] [birth year] [id number]  Care coordination call placed to [employer] in regard to Eliquis application.  Spoke to [place] who informs patient is APPROVED 01/22/22-03/15/22. Medication will be delivered to patient's home. Patient is aware of her approval.  [name] P. Brittanie Dosanjh, [place]  [phone]

## 2022-02-02 NOTE — Progress Notes (Unsigned)
3 MONTH FOLLOW UP  Assessment and Plan:   A. Fib with RVR ([place]) Rate controlled on current agents; on elequis without bleeding concerns  Cardiology following;   Pulmonary hypertension ([place]) Normal PFTs; abnormal sleep study; now on CPAP  Central sleep apnea associated with a. Fib ([place]) Dr. Brett Fairy following; on auto CPAP with good results; endorses 100% compliance  Morbid obesity ([place]) - BMI 30+ with sleep apnea Long discussion about weight loss, diet, and exercise Recommended diet heavy in fruits and veggies and low in animal meats, cheeses, and dairy products, appropriate calorie intake Discussed appropriate weight for height  She is poorly motivated to work on weight , but agreeable to making small changes; discussed portions for cereal, do more protein/veggie leftovers, alternate with sandwich/chips, portions for ice cream  Weigh once a week and keep log Follow up at next visit  Gastroesophageal reflux disease with esophagitis Well managed on current medications Discussed diet, avoiding triggers and other lifestyle changes  Esophageal stricture Continue GERD medications; follows with GI Discussed diet, avoiding triggers and other lifestyle changes  Primary osteoarthritis involving multiple joints Primarily knees, does fairly,tylenol, flexeril Discussed topical diclofenac/aspercreme Consider gabapentin or ortho referral for injections - declines further interventions at this time   Recurrent major depressive disorder, in full remission ([place]) Discussed higher risk of interactions with paxil, suggested trial of switch to lexapro; she will consider; information given Lifestyle discussed: diet/exerise, sleep hygiene, stress management, hydration  Chronic low back pain with left-sided sciatica, unspecified back pain laterality Takes tylenol, flexeril -  No longer on oral diclofenac due to heart  History of colon polyps Repeat colonoscopy 05/2020 per GI - she declined  referral, not planning to pursue further at this time Denies concerning sx  Anemia, iron deficiency Chronic recurrent for many years Stable on iron supplement; Monitor CBC, iron/ferritin  Vitamin D deficiency -     Continue supplement  Cholesterol Mild/moderate elevations; low risk history and age; currently managed by lifestyle  Continue low cholesterol diet and exercise.  Check lipid panel   Unsteady gait/physical deconditioning/increased risk injury with falls Now on elequis, very sedentary with increased falls/near falls She does not drive Discussed and receptive to home PT for balance, endurance and fall prevention - Discussed and given home exercise information, fall prevention  No orders of the defined types were placed in this encounter.    Discussed med's effects and SE's. Screening labs and tests as requested with regular follow-up as recommended. Over 30 minutes of exam, counseling, chart review, and complex, high level critical decision making was performed this visit.   Future Appointments  Date Time Provider [place]  02/03/2022 11:00 AM Alycia Rossetti, NP [facility] None  02/17/2022  3:30 PM [name], NP [facility] None  05/12/2022 10:30 AM [name], MD [facility] None  08/11/2022 11:00 AM Alycia Rossetti, NP [facility] None  11/12/2022 11:00 AM [name], MD [facility] None       HPI  81 y.o. female  presents for a  follow up for has DJD (degenerative joint disease); Chronic lower back pain; Depression; GERD (gastroesophageal reflux disease); Morbid obesity ([place]) - BMI 30+ with OSA; History of colon polyps; Abnormal glucose; Hyperlipidemia, mixed; Vitamin D deficiency; Continuous leakage of urine; Esophageal stricture; Iron deficiency anemia; Atrial fibrillation with RVR ([place]); Hypoxemia requiring supplemental oxygen; LVH (left ventricular hypertrophy) due to hypertensive disease, without heart failure; Severe obstructive sleep  apnea-hypopnea syndrome; Central sleep apnea associated with atrial fibrillation ([place]); Pulmonary hypertension ([place]); Physical  deconditioning; and At moderate risk for fall on their problem list.   She lives with her daughter and family, they drive her when needed. Didn't want to bother getting new driver's license. She watches her 2 young grandchildren.   She was fairly sedentary at baseline, but reported since admission has done minimal walking, no energy, reports has had several near falls but no injury. Uses cane for balance. She is dependent on family for transport, would be receptive to in home PT.   She has incontinence, has tried oxybutynin and myrbetriq that did not help. Wears depends and has declined further interventions.   She has been on paxil for depression for many years and doing well, has not tried taper attempt. Previously has taken benzos, no other SSRIs.   Patient was hospitalized in Jan/Feb 2021 with new onset Afib with RVR, LVH on ECHO, EF 55-60%, now on oral diltiazem 300 mg daily, lopressor (50 mg AM, 25 mg PM), elequis 5 mg BID. Dr. Harl Bowie is following. Had low risk NM study. She did have sleep study by Dr. Brett Fairy demonstrating central sleep apnea, now on Auto CPAP and does endorse 100% compliance.   Has chronic intermittent lower back pain, using tylenol, flexeril. Off of oral diclofenac.   BMI is There is no height or weight on file to calculate BMI., she has been working on diet, eating better living with daughter.  Wt Readings from Last 3 Encounters:  12/02/21 233 lb 12.8 oz (106.1 kg)  10/29/21 229 lb 12.8 oz (104.2 kg)  09/29/21 232 lb (105.2 kg)   Today their BP is    She does not workout. She denies chest pain, dizziness. Does have exertional dyspnea, ongoing for many years, felt r/t deconditioning.    She is not on cholesterol medication, reports hx of myalgias with unknown medication at one point, declines medications. Her cholesterol is not at goal. The  cholesterol last visit was:   Lab Results  Component Value Date   CHOL 192 10/29/2021   HDL 68 10/29/2021   LDLCALC 107 (H) 10/29/2021   TRIG 83 10/29/2021   CHOLHDL 2.8 10/29/2021    She has not been working on diet and exercise for glucose management. Last A1C in the office was:  Lab Results  Component Value Date   HGBA1C 5.6 10/29/2021    Last GFR:  Lab Results  Component Value Date   GFRNONAA >60 08/07/2020   Patient is on Vitamin D supplement.   Lab Results  Component Value Date   VD25OH 73 10/29/2021      She has long hx of iron def anemia, Had colonoscopy with polyps 05/2017, EGD 3.2019 by Dr. Fuller Plan, no bleeding or ulcers. currently normal levels and has backed off on chronic iron supplement to every other day, has been on for several years due to recurrence.      Latest Ref Rng & Units 10/29/2021   10:47 AM 07/28/2021    2:15 PM 04/23/2021   11:14 AM  CBC  WBC 3.8 - 10.8 Thousand/uL 6.3  6.1  5.7   Hemoglobin 11.7 - 15.5 g/dL 14.2  13.5  14.2   Hematocrit 35.0 - 45.0 % 41.1  38.9  41.9   Platelets 140 - 400 Thousand/uL 216  206  206    She takes slow release iron 65 mg every other day Lab Results  Component Value Date   IRON 169 (H) 04/23/2021   TIBC 320 04/23/2021   FERRITIN 62 04/23/2021  Current Medications:   Current Outpatient Medications (Endocrine & Metabolic):    predniSONE (DELTASONE) 10 MG tablet, 1 tab 3 x day for 2 days, then 1 tab 2 x day for 2 days, then 1 tab 1 x day for 3 days  Current Outpatient Medications (Cardiovascular):    diltiazem (CARDIZEM CD) 300 MG 24 hr capsule, Take 1 capsule (300 mg total) by mouth daily.   metoprolol tartrate (LOPRESSOR) 25 MG tablet, TAKE 2 TABLETS BY MOUTH IN  THE MORNING AND 1 TABLET IN THE EVENING   nitroGLYCERIN (NITROSTAT) 0.4 MG SL tablet, Place 1 tablet (0.4 mg total) under the tongue every 5 (five) minutes as needed for chest pain.  Current Outpatient Medications (Respiratory):     ipratropium-albuterol (DUONEB) 0.5-2.5 (3) MG/3ML SOLN, USE 1 VIAL IN NEBULIZER EVERY 4 HOURS   promethazine-dextromethorphan (PROMETHAZINE-DM) 6.25-15 MG/5ML syrup, Take 2.5 mLs by mouth 4 (four) times daily as needed for cough.  Current Outpatient Medications (Analgesics):    acetaminophen (TYLENOL) 500 MG tablet, Take 500 mg by mouth every 6 (six) hours as needed.  Current Outpatient Medications (Hematological):    apixaban (ELIQUIS) 5 MG TABS tablet, Take 1 tablet (5 mg total) by mouth 2 (two) times daily.   Cyanocobalamin 2500 MCG TABS, Take 2,500 mcg by mouth daily.   ferrous sulfate 325 (65 FE) MG tablet, Take 1 tablet (325 mg total) by mouth every other day.  Current Outpatient Medications (Other):    Ascorbic Acid (VITAMIN C) 500 MG CAPS, Take 500 mg by mouth.    busPIRone (BUSPAR) 10 MG tablet, TAKE 1/2 TO 1 (ONE-HALF TO ONE) TABLET BY MOUTH THREE TIMES DAILY AS NEEDED FOR ANXIETY, MOOD OR IRRITABILITY   CHOLECALCIFEROL PO, Take 5,000 Units by mouth daily.   cyclobenzaprine (FLEXERIL) 10 MG tablet, Take 1/2 to 1 tablet    2 to 3 x /day if needed for Muscle Spasms   escitalopram (LEXAPRO) 20 MG tablet, Take 1 tablet Daily for Mood, Anxiety & Irritability   lansoprazole (PREVACID) 30 MG capsule, TAKE 1 CAPSULE BY MOUTH  DAILY FOR HEARTBURN AND  INDIGESTION   Magnesium 250 MG TABS, Take by mouth daily.   oxybutynin (DITROPAN-XL) 10 MG 24 hr tablet, Take 1 tablet  Daily for Bladder Control   PARoxetine (PAXIL) 20 MG tablet, Take  1 tablet  Daily for Mood                                                   /                                      TAKE                                        BY                                    MOUTH   Probiotic Product (PROBIOTIC DAILY PO), Take 1 tablet by mouth daily.   psyllium (METAMUCIL) 58.6 % packet, Take 1 packet by mouth daily.   zinc gluconate 50 MG  tablet, Take 50 mg by mouth daily.  Allergies:  Allergies  Allergen Reactions   Cephalosporins  Rash     Medical History:  She has DJD (degenerative joint disease); Chronic lower back pain; Depression; GERD (gastroesophageal reflux disease); Morbid obesity ([place]) - BMI 30+ with OSA; History of colon polyps; Abnormal glucose; Hyperlipidemia, mixed; Vitamin D deficiency; Continuous leakage of urine; Esophageal stricture; Iron deficiency anemia; Atrial fibrillation with RVR ([place]); Hypoxemia requiring supplemental oxygen; LVH (left ventricular hypertrophy) due to hypertensive disease, without heart failure; Severe obstructive sleep apnea-hypopnea syndrome; Central sleep apnea associated with atrial fibrillation ([place]); Pulmonary hypertension ([place]); Physical deconditioning; and At moderate risk for fall on their problem list.   Health Maintenance:   Immunization History  Administered Date(s) Administered   Fluad Quad(high Dose 65+) 12/04/2019, 12/21/2020   Influenza, High Dose Seasonal PF 01/29/2018   Influenza-Unspecified 12/09/2016, 11/28/2018   Moderna Sars-Covid-2 Vaccination 03/28/2019, 04/28/2019, 02/27/2020   Pneumococcal Conjugate-13 02/12/2016, 09/30/2017   Pneumococcal Polysaccharide-23 03/16/2006, 04/02/2008   Zoster, Live 07/31/2013   Health Maintenance  Topic Date Due   Zoster Vaccines- Shingrix (1 of 2) Never done   Medicare Annual Wellness (AWV)  02/11/2017   COVID-19 Vaccine (4 - Moderna risk series) 04/23/2020   INFLUENZA VACCINE  10/14/2021   DEXA SCAN  04/23/2022 (Originally 01/13/2005)   MAMMOGRAM  03/13/2023   Pneumonia Vaccine 80+ Years old  Completed   HPV VACCINES  Aged Out   COLONOSCOPY (Pts 45-80yr Insurance coverage will need to be confirmed)  Discontinued   Tetanus: Declines, cost  Pap: remote MGM: gets annually at breast center DEXA: remote, normal, declines further; discussed risk of falls and fractures; she expresses understanding Colonoscopy: 05/2017, polyps  - due 05/2020, but she decided not to pursue further  CXR 03/2019 - small hiatal hernia    Last Dental Exam: Dr. MLynnette Caffey last visit 2022, goes [id number] Eye Exam: [facility], 2022, mild cataracts  Patient Care Team: McUnk PintoMD as PCP - General (Internal Medicine) BrHarl BowieoAlphonse GuildMD as PCP - Cardiology (Cardiology)  Surgical History:  She has a past surgical history that includes Abdominal hysterectomy (03/1979); Cataract extraction, bilateral (Bilateral, 2014); and Breast biopsy (Left, 1986 and 1987). Family History:  Herfamily history includes Dementia in her brother; Heart disease in her father and mother; Hypertension in her daughter, father, and mother; Tremor in her sister. Social History:  She reports that she has never smoked. She has never used smokeless tobacco. She reports current alcohol use of about 3.0 standard drinks of alcohol per week. She reports that she does not use drugs.    Review of Systems  Constitutional:  Negative for malaise/fatigue and weight loss.  HENT:  Negative for hearing loss and tinnitus.   Eyes:  Negative for blurred vision and double vision.  Respiratory:  Negative for cough, shortness of breath and wheezing.   Cardiovascular:  Negative for chest pain, palpitations, orthopnea, claudication and leg swelling.  Gastrointestinal:  Negative for abdominal pain, blood in stool, constipation, diarrhea, heartburn, melena, nausea and vomiting.  Genitourinary: Negative.   Musculoskeletal:  Positive for back pain (chronic intermittent lumbar) and falls (2 in last year, none in last 3 months, no injury, frequent near falls per patient). Negative for joint pain and myalgias.  Skin:  Negative for rash.  Neurological:  Negative for dizziness, tingling, sensory change, weakness and headaches.  Endo/Heme/Allergies:  Negative for polydipsia.  Psychiatric/Behavioral: Negative.    All other systems reviewed and are negative.  Physical Exam: Estimated body mass index is 33.55 kg/m as calculated from the following:   Height as of  12/02/21: '5\' 10"'$  (1.778 m).   Weight as of 12/02/21: 233 lb 12.8 oz (106.1 kg). There were no vitals taken for this visit. General Appearance: Well nourished, in no apparent distress.  Eyes: PERRLA, EOMs, conjunctiva no swelling or erythema, normal fundi and vessels.  Sinuses: No Frontal/maxillary tenderness  ENT/Mouth: Ext aud canals clear, normal light reflex with TMs without erythema, bulging. Mask in place; oral exam deferred. Hearing normal.  Neck: Supple, thyroid normal. No bruits  Respiratory: Respiratory effort normal, BS equal bilaterally without rales, rhonchi, wheezing or stridor.  Cardio: Irregularly irregular. Brisk peripheral pulses without edema.  Chest: symmetric, with normal excursions and percussion.  Breasts: Symmetric, without lumps, nipple discharge, retractions.  Abdomen: Soft, nontender, no guarding, rebound, hernias, masses, or organomegaly.  Lymphatics: Non tender without lymphadenopathy.  Genitourinary: Defer Musculoskeletal: Full ROM all peripheral extremities, 4/5 strength, and slow steady gait and sitting to standing. Mild kyphosis.  Skin: Warm, dry without rashes, lesions, ecchymosis. Neuro: Cranial nerves intact, reflexes equal bilaterally. Normal muscle tone, no cerebellar symptoms. Sensation intact.  Psych: Awake and oriented X 3, normal affect, Insight and Judgment appropriate.    Xian Alves E  12:13 PM [facility]

## 2022-02-03 ENCOUNTER — Encounter: Payer: Self-pay | Admitting: Nurse Practitioner

## 2022-02-03 ENCOUNTER — Ambulatory Visit (INDEPENDENT_AMBULATORY_CARE_PROVIDER_SITE_OTHER): Payer: Medicare Other | Admitting: Nurse Practitioner

## 2022-02-03 VITALS — BP 104/60 | HR 70 | Temp 97.5°F | Ht 70.0 in | Wt 232.8 lb

## 2022-02-03 DIAGNOSIS — E782 Mixed hyperlipidemia: Secondary | ICD-10-CM

## 2022-02-03 DIAGNOSIS — R7309 Other abnormal glucose: Secondary | ICD-10-CM

## 2022-02-03 DIAGNOSIS — I4891 Unspecified atrial fibrillation: Secondary | ICD-10-CM | POA: Diagnosis not present

## 2022-02-03 DIAGNOSIS — Z86010 Personal history of colonic polyps: Secondary | ICD-10-CM

## 2022-02-03 DIAGNOSIS — K222 Esophageal obstruction: Secondary | ICD-10-CM | POA: Diagnosis not present

## 2022-02-03 DIAGNOSIS — I10 Essential (primary) hypertension: Secondary | ICD-10-CM | POA: Diagnosis not present

## 2022-02-03 DIAGNOSIS — F3342 Major depressive disorder, recurrent, in full remission: Secondary | ICD-10-CM

## 2022-02-03 DIAGNOSIS — Z23 Encounter for immunization: Secondary | ICD-10-CM | POA: Diagnosis not present

## 2022-02-03 DIAGNOSIS — E559 Vitamin D deficiency, unspecified: Secondary | ICD-10-CM

## 2022-02-03 DIAGNOSIS — K2100 Gastro-esophageal reflux disease with esophagitis, without bleeding: Secondary | ICD-10-CM

## 2022-02-03 DIAGNOSIS — I272 Pulmonary hypertension, unspecified: Secondary | ICD-10-CM | POA: Diagnosis not present

## 2022-02-03 DIAGNOSIS — D508 Other iron deficiency anemias: Secondary | ICD-10-CM | POA: Diagnosis not present

## 2022-02-03 DIAGNOSIS — R053 Chronic cough: Secondary | ICD-10-CM

## 2022-02-03 DIAGNOSIS — Z79899 Other long term (current) drug therapy: Secondary | ICD-10-CM

## 2022-02-03 DIAGNOSIS — M159 Polyosteoarthritis, unspecified: Secondary | ICD-10-CM

## 2022-02-03 DIAGNOSIS — G4737 Central sleep apnea in conditions classified elsewhere: Secondary | ICD-10-CM

## 2022-02-03 MED ORDER — BENZONATATE 100 MG PO CAPS: 100.0000 mg | ORAL_CAPSULE | Freq: Four times a day (QID) | ORAL | 1 refills | Status: DC | PRN

## 2022-02-03 NOTE — Patient Instructions (Signed)

## 2022-02-04 LAB — LIPID PANEL
Cholesterol: 177 mg/dL (ref <200)
HDL: 64 mg/dL (ref >=50)
LDL Cholesterol (Calc): 95 mg/dL (calc)
Non-HDL Cholesterol (Calc): 113 mg/dL (calc) (ref <130)
Total CHOL/HDL Ratio: 2.8 (calc) (ref <5.0)
Triglycerides: 89 mg/dL (ref <150)

## 2022-02-04 LAB — CBC WITH DIFFERENTIAL/PLATELET
Absolute Monocytes: 522 cells/uL (ref 200–950)
Basophils Absolute: 30 cells/uL (ref 0–200)
Basophils Relative: 0.5 %
Eosinophils Absolute: 108 cells/uL (ref 15–500)
Eosinophils Relative: 1.8 %
HCT: 39.3 % (ref 35.0–45.0)
Hemoglobin: 13.7 g/dL (ref 11.7–15.5)
Lymphs Abs: 1416 cells/uL (ref 850–3900)
MCH: 33.4 pg — ABNORMAL HIGH (ref 27.0–33.0)
MCHC: 34.9 g/dL (ref 32.0–36.0)
MCV: 95.9 fL (ref 80.0–100.0)
MPV: 11.8 fL (ref 7.5–12.5)
Monocytes Relative: 8.7 %
Neutro Abs: 3924 cells/uL (ref 1500–7800)
Neutrophils Relative %: 65.4 %
Platelets: 202 10*3/uL (ref 140–400)
RBC: 4.1 10*6/uL (ref 3.80–5.10)
RDW: 12.9 % (ref 11.0–15.0)
Total Lymphocyte: 23.6 %
WBC: 6 10*3/uL (ref 3.8–10.8)

## 2022-02-04 LAB — COMPLETE METABOLIC PANEL WITH GFR
AG Ratio: 1.8 (calc) (ref 1.0–2.5)
ALT: 11 U/L (ref 6–29)
AST: 15 U/L (ref 10–35)
Albumin: 3.7 g/dL (ref 3.6–5.1)
Alkaline phosphatase (APISO): 59 U/L (ref 37–153)
BUN: 14 mg/dL (ref 7–25)
CO2: 28 mmol/L (ref 20–32)
Calcium: 9.4 mg/dL (ref 8.6–10.4)
Chloride: 106 mmol/L (ref 98–110)
Creat: 0.66 mg/dL (ref 0.60–0.95)
Globulin: 2.1 g/dL (calc) (ref 1.9–3.7)
Glucose, Bld: 100 mg/dL — ABNORMAL HIGH (ref 65–99)
Potassium: 4.2 mmol/L (ref 3.5–5.3)
Sodium: 142 mmol/L (ref 135–146)
Total Bilirubin: 0.6 mg/dL (ref 0.2–1.2)
Total Protein: 5.8 g/dL — ABNORMAL LOW (ref 6.1–8.1)
eGFR: 88 mL/min/{1.73_m2} (ref >=60)

## 2022-02-04 LAB — MAGNESIUM: Magnesium: 2 mg/dL (ref 1.5–2.5)

## 2022-02-16 NOTE — Progress Notes (Unsigned)
PATIENT: Darlene [name] DOB: November 30, 1940  REASON FOR VISIT: follow up HISTORY FROM: patient  No chief complaint on file.    HISTORY OF PRESENT ILLNESS:  02/16/22 ALL:  Darlene [name] returns for follow up for OSA on CPAP.   09/10/2021 ALL: Darlene [name] returns for follow up for OSA on CPAP. She continues to struggle with finding a mask that works well for her. She has tried two different nasal pillow masks and a traditional FFM. She has leaks with most all or has difficulty with dry mouth. She used a nasal pillow that seemed to work best but caused skin irritation to nares. She has not tried a barrier cream. She has not noted any significant benefit in how she feels with using therapy. She uses therapy for about 5 hours. Once she wakes to urinate at night she removes therapy and does not resume.   Compliance report dated 08/10/2021-09/08/2021 shows she used CPAP 30/30 days. She used therapy greater than 4 hours 27/30 days (90%). Average usage was 5hr 55mn. Residual AHI 5.8/hr on 7-17cmH20. Large leak noted in 95th percentile of 30.9 l/min.   05/20/2020 ALL: Darlene [name] a 81y.o. female here today for follow up for OSA on CPAP.  She is doing well on therapy. She is not sure that she feels any better but recognizes need for therapy. She switched to a nasal pillow but feels it is causing tenderness of her nares. She would like to try a nasal mask. Otherwise, doing very well.      HISTORY: (copied from Dr Dohmeier's previous note)  Rv 11-14-2019-   Darlene LUCARELLIis a 81year old white female patient seen here for a revisit  on 11/14/2019 ; I had ordered sleep studies after her last visit here with me which was actually her first and the patient presented on [id number] in lab sleep study which had been ordered requested by her primary care physician as well as by Dr. KBronson Ingher cardiologist at the time.  Patient has known atrial fibrillation congestive due to diastolic heart failure.  Apnea  hypopnea index and that study was 11/h REM AHI was 19.9 non-REM AHI was 8.8 and in supine position while sleeping on her back, her AHI was 17.2 overall this was mild apnea but with the lack of oxygen desaturation time.  And in addition it appeared to be a classic apnea slightly more central events and obstructive.  For this reason we did not want to autotitrator also to return for CPAP titration.  That took place on 07-11-19 and here now the patient was put first exposed to CPAP but it did not work CPAP titration left her with a residual apnea index of 10.4 for that reason she had to be titrated to BiPAP CPAP highest pressure tried was 16 cmH2O.   On the BiPAP of 15 under 19 there was no reduction of the apnea either she still had 30 apneas per hour.  The technician gave her a Mirage Quatro mask and medium full facemask.  She remained was hypoxic oxygen saturation for much of the study diagnosis was severe central sleep apnea now treatment emerging failure to alleviate on the CPAP or BiPAP.   Foot attempts to control her apnea at this time she was titrated to ASV which she responded to beautifully.  A very first sleep study had documented an AHI of 53.7 on May 24, 2019 her ASV study allowed an AHI of 0.0 apneas per hour on  the a pressure of 10 cmH2O.     I have now seen her download- she has been placed on auto CPAP and is doing well, we will change the mask form a FFM ( ? Why was she given a FFM?) to the desired nasal mask. She would like an ESON medium mask.    REVIEW OF SYSTEMS: Out of a complete 14 system review of symptoms, the patient complains only of the following symptoms, none and all other reviewed systems are negative.  FSS: 63   ALLERGIES: Allergies  Allergen Reactions   Cephalosporins Rash    HOME MEDICATIONS: Outpatient Medications Prior to Visit  Medication Sig Dispense Refill   acetaminophen (TYLENOL) 500 MG tablet Take 500 mg by mouth every 6 (six) hours as needed.      apixaban (ELIQUIS) 5 MG TABS tablet Take 1 tablet (5 mg total) by mouth 2 (two) times daily. 180 tablet 3   Ascorbic Acid (VITAMIN C) 500 MG CAPS Take 500 mg by mouth.      benzonatate (TESSALON PERLES) 100 MG capsule Take 1 capsule (100 mg total) by mouth every 6 (six) hours as needed for cough. 30 capsule 1   busPIRone (BUSPAR) 10 MG tablet TAKE 1/2 TO 1 (ONE-HALF TO ONE) TABLET BY MOUTH THREE TIMES DAILY AS NEEDED FOR ANXIETY, MOOD OR IRRITABILITY 90 tablet 0   CHOLECALCIFEROL PO Take 5,000 Units by mouth daily.     Cyanocobalamin 2500 MCG TABS Take 2,500 mcg by mouth daily.     cyclobenzaprine (FLEXERIL) 10 MG tablet Take 1/2 to 1 tablet    2 to 3 x /day if needed for Muscle Spasms 90 tablet 0   diltiazem (CARDIZEM CD) 300 MG 24 hr capsule Take 1 capsule (300 mg total) by mouth daily. 90 capsule 3   escitalopram (LEXAPRO) 20 MG tablet Take 1 tablet Daily for Mood, Anxiety & Irritability 90 tablet 3   ferrous sulfate 325 (65 FE) MG tablet Take 1 tablet (325 mg total) by mouth every other day.     ipratropium-albuterol (DUONEB) 0.5-2.5 (3) MG/3ML SOLN USE 1 VIAL IN NEBULIZER EVERY 4 HOURS 3 mL 11   lansoprazole (PREVACID) 30 MG capsule TAKE 1 CAPSULE BY MOUTH  DAILY FOR HEARTBURN AND  INDIGESTION 90 capsule 3   Magnesium 250 MG TABS Take by mouth daily.     metoprolol tartrate (LOPRESSOR) 25 MG tablet TAKE 2 TABLETS BY MOUTH IN  THE MORNING AND 1 TABLET IN THE EVENING 270 tablet 3   nitroGLYCERIN (NITROSTAT) 0.4 MG SL tablet Place 1 tablet (0.4 mg total) under the tongue every 5 (five) minutes as needed for chest pain. 25 tablet 3   oxybutynin (DITROPAN-XL) 10 MG 24 hr tablet Take 1 tablet  Daily for Bladder Control 90 tablet 3   Probiotic Product (PROBIOTIC DAILY PO) Take 1 tablet by mouth daily.     psyllium (METAMUCIL) 58.6 % packet Take 1 packet by mouth daily.     zinc gluconate 50 MG tablet Take 50 mg by mouth daily.     No facility-administered medications prior to visit.    PAST  MEDICAL HISTORY: Past Medical History:  Diagnosis Date   Anxiety    Chronic lower back pain    Depression    DJD (degenerative joint disease)    knees, neck   GERD (gastroesophageal reflux disease)    has had dilitation in the past   Hypertension     PAST SURGICAL HISTORY: Past Surgical History:  Procedure  Laterality Date   ABDOMINAL HYSTERECTOMY  03/1979   BREAST BIOPSY Left 1986 and 1987   CATARACT EXTRACTION, BILATERAL Bilateral 2014   Dr. Madelin Headings, in [place]: Family History  Problem Relation Age of Onset   Hypertension Mother    Heart disease Mother    Hypertension Father    Heart disease Father    Tremor Sister    Dementia Brother    Hypertension Daughter    Colon cancer Neg Hx    Esophageal cancer Neg Hx    Rectal cancer Neg Hx    Stomach cancer Neg Hx     SOCIAL HISTORY: Social History   Socioeconomic History   Marital status: Widowed    Spouse name: Not on file   Number of children: 2   Years of education: Not on file   Highest education level: 12th grade  Occupational History   Occupation: 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  Tobacco Use   Smoking status: Never   Smokeless tobacco: Never  Vaping Use   Vaping Use: Never used  Substance and Sexual Activity   Alcohol use: Yes    Alcohol/week: 3.0 standard drinks of alcohol    Types: 3 Standard drinks or equivalent per week    Comment: 3 nights out of the week   Drug use: No   Sexual activity: Not Currently  Other Topics Concern   Not on file  Social History Narrative   Not on file   Social Determinants of Health   Financial Resource Strain: Low Risk  (02/03/2021)   Overall Financial Resource Strain (CARDIA)    Difficulty of Paying Living Expenses: Not very hard  Food Insecurity: No Food Insecurity (07/17/2021)   Hunger Vital Sign    Worried About Running Out of Food in the Last Year: Never true    [place] in the Last Year: Never true  Transportation  Needs: No Transportation Needs (07/17/2021)   PRAPARE - Hydrologist (Medical): No    Lack of Transportation (Non-Medical): No  Physical Activity: Inactive (04/28/2019)   Exercise Vital Sign    Days of Exercise per Week: 0 days    Minutes of Exercise per Session: 0 min  Stress: No Stress Concern Present (04/28/2019)   [place]    Feeling of Stress : Only a little  Social Connections: Moderately Integrated (04/28/2019)   Social Connection and Isolation Panel [NHANES]    Frequency of Communication with Friends and Family: More than three times a week    Frequency of Social Gatherings with Friends and Family: More than three times a week    Attends Religious Services: 1 to 4 times per year    Active Member of [employer] or Organizations: Yes    Attends Archivist Meetings: More than 4 times per year    Marital Status: Widowed  Intimate Partner Violence: Not At Risk (04/28/2019)   Humiliation, Afraid, Rape, and Kick questionnaire    Fear of Current or Ex-Partner: No    Emotionally Abused: No    Physically Abused: No    Sexually Abused: No     PHYSICAL EXAM  There were no vitals filed for this visit.   There is no height or weight on file to calculate BMI.  Generalized: Well developed, in no acute distress  Cardiology: normal rate and irregularly irregular rhythm, no murmur noted Respiratory: clear to  auscultation bilaterally  Neurological examination  Mentation: Alert oriented to time, place, history taking. Follows all commands speech and language fluent Cranial nerve II-XII: Pupils were equal round reactive to light. Extraocular movements were full, visual field were full  Motor: The motor testing reveals 5 over 5 strength of all 4 extremities. Good symmetric motor tone is noted throughout.  Gait and station: Gait is arthritic but stable with cane     DIAGNOSTIC DATA (LABS,  IMAGING, TESTING) - I reviewed patient records, labs, notes, testing and imaging myself where available.      No data to display           Lab Results  Component Value Date   WBC 6.0 02/03/2022   HGB 13.7 02/03/2022   HCT 39.3 02/03/2022   MCV 95.9 02/03/2022   PLT 202 02/03/2022      Component Value Date/Time   NA 142 02/03/2022 0000   K 4.2 02/03/2022 0000   CL 106 02/03/2022 0000   CO2 28 02/03/2022 0000   GLUCOSE 100 (H) 02/03/2022 0000   BUN 14 02/03/2022 0000   CREATININE 0.66 02/03/2022 0000   CALCIUM 9.4 02/03/2022 0000   PROT 5.8 (L) 02/03/2022 0000   ALBUMIN 2.8 (L) 04/15/2019 1216   AST 15 02/03/2022 0000   ALT 11 02/03/2022 0000   ALKPHOS 77 04/15/2019 1216   BILITOT 0.6 02/03/2022 0000   GFRNONAA >60 08/07/2020 1315   GFRNONAA 75 04/03/2020 1030   GFRAA 87 04/03/2020 1030   Lab Results  Component Value Date   CHOL 177 02/03/2022   HDL 64 02/03/2022   LDLCALC 95 02/03/2022   TRIG 89 02/03/2022   CHOLHDL 2.8 02/03/2022   Lab Results  Component Value Date   HGBA1C 5.6 10/29/2021   Lab Results  Component Value Date   VITAMINB12 423 03/31/2017   Lab Results  Component Value Date   TSH 1.68 10/29/2021     ASSESSMENT AND PLAN 81 y.o. year old female  has a past medical history of Anxiety, Chronic lower back pain, Depression, DJD (degenerative joint disease), GERD (gastroesophageal reflux disease), and Hypertension. here with   No diagnosis found.   SARAIAH BHAT is continuing CPAP therapy but has struggled to find a mask that works for her. Compliance report reveals excellent compliance. She will continue to monitor for leak at home. We will have her try a saline gel or barrier cream with previous nasal pillow that was best fit for her. She was encouraged to continue using CPAP nightly and for greater than 4 hours each night. We will update supply orders as indicated. Risks of untreated sleep apnea review and education materials provided.  Healthy lifestyle habits encouraged. She will follow up in 6 months, sooner if needed. She verbalizes understanding and agreement with this plan.    No orders of the defined types were placed in this encounter.    No orders of the defined types were placed in this encounter.       [name], FNP-C 02/16/2022, 4:04 PM [facility] [address], [place], [place] [address] [phone]

## 2022-02-16 NOTE — Patient Instructions (Incomplete)

## 2022-02-17 ENCOUNTER — Ambulatory Visit: Payer: Medicare Other | Admitting: Family Medicine

## 2022-02-17 ENCOUNTER — Encounter: Payer: Self-pay | Admitting: Family Medicine

## 2022-02-17 VITALS — BP 112/70 | HR 66 | Ht 70.0 in | Wt 236.0 lb

## 2022-02-17 DIAGNOSIS — G4733 Obstructive sleep apnea (adult) (pediatric): Secondary | ICD-10-CM

## 2022-02-18 ENCOUNTER — Telehealth: Payer: Self-pay

## 2022-03-04 DIAGNOSIS — G4733 Obstructive sleep apnea (adult) (pediatric): Secondary | ICD-10-CM | POA: Diagnosis not present

## 2022-03-12 ENCOUNTER — Ambulatory Visit: Payer: Medicare Other | Admitting: Family Medicine

## 2022-03-16 HISTORY — PX: EYE SURGERY: SHX253

## 2022-04-02 ENCOUNTER — Other Ambulatory Visit: Payer: Self-pay | Admitting: Internal Medicine

## 2022-04-02 DIAGNOSIS — J40 Bronchitis, not specified as acute or chronic: Secondary | ICD-10-CM

## 2022-04-06 ENCOUNTER — Other Ambulatory Visit: Payer: Self-pay | Admitting: Internal Medicine

## 2022-04-06 DIAGNOSIS — Z1231 Encounter for screening mammogram for malignant neoplasm of breast: Secondary | ICD-10-CM

## 2022-04-07 ENCOUNTER — Other Ambulatory Visit: Payer: Self-pay | Admitting: Internal Medicine

## 2022-04-07 DIAGNOSIS — F419 Anxiety disorder, unspecified: Secondary | ICD-10-CM

## 2022-04-19 ENCOUNTER — Other Ambulatory Visit: Payer: Self-pay | Admitting: Cardiology

## 2022-05-11 ENCOUNTER — Encounter: Payer: Self-pay | Admitting: Internal Medicine

## 2022-05-11 NOTE — Progress Notes (Unsigned)
Future Appointments  Date Time Provider Department  05/12/2022 10:30 AM [name], MD [facility]  08/13/2022 11:30 AM Darrol Jump, NP [facility]  11/12/2022 11:00 AM [name], MD [facility]  02/18/2023  2:00 PM [name], NP [facility]    History of Present Illness:       This very nice 82 y.o. Welcome with  labile HTN, ASHD /pAfib, Pulm HTN, OSA, HLD, Prediabetes and Vitamin D Deficiency presents for f/u . Patient has hx/o GERD controlled on her meds and also has hx/o Major Depression in remission on meds. Patient is followed by Dr Brett Fairy for her OSA on auto CPAP.        Patient is treated for HTN  since  & BP has been controlled at home. Today's BP is at goal -  110/78. Patient has had no complaints of any cardiac type chest pain, palpitations, dyspnea / orthopnea / PND, dizziness, claudication, or dependent edema. Patient has OSAon CPAP followed by Dr         Hyperlipidemia is  controlled with diet & meds. Patient denies myalgias or other med SE's. Last Lipids were not at goal :  Lab Results  Component Value Date   CHOL 177 02/03/2022   HDL 64 02/03/2022   LDLCALC 95 02/03/2022   TRIG 89 02/03/2022   CHOLHDL 2.8 02/03/2022     Also, the patient has history of PreDiabetes (A1c 5.9% in Jan 2022)  and has had no symptoms of reactive hypoglycemia, diabetic polys, paresthesias or visual blurring.  Last A1c was near goal :  Lab Results  Component Value Date   HGBA1C 5.7 (H) 10/29/2020                                                         Further, the patient also has history of Vitamin D Deficiency ("28" /2019) and supplements vitamin D . Last vitamin D was at goal :  Lab Results  Component Value Date   VD25OH 76 10/29/2020        acetaminophen 500 MG  Take every 6 (six) hours as needed., Disp: , Rfl:    apixaban (ELIQUIS) 5 MG , Take 1 tablet  2 (two) times daily., Disp: 180 tablet, Rf: 3   VITAMIN C  500 MG CAPS, Take dai;y. , Disp: , Rfl:     Vitamin D 5,000 Units , Take  daily., Disp: , Rfl:    cyanocobalamin 1000 MCG tablet, Take 1,000 mcg daily., Disp: , Rfl:    cyclobenzaprine 10 MG tablet, Take 1/2 to 1 tablet    2 to 3 x /day if needed for Muscle Spasms, Disp: 90 tablet, Rfl: 0   diltiazem  CD 300 MG Take 1 capsule  daily, Disp: 90 capsule, Rfl: 0   ferrous sulfate 325 (65 FE) MG tablet, Take 1 tablet every other day., Disp: , Rfl:    DUONEB 0.5-2.5 (3) MG/3ML SOLN, Take 3 mLs by nebulization every 4 (four) hours as needed. Max:6 doses per day, Disp: 540 mL, Rfl: 0   lansoprazole 30 MG capsule, TAKE 1 CAPSULE   DAILY FOR    Magnesium 250 MG TABS, Take , Disp: , Rfl:    metoprolol tartrate (LOPRESSOR) 25 MG tablet, TAKE 2 TABLETS  IN  THE MORNING AND 1 TABLET IN THE  EVENING, Disp: 270 tablet, Rfl: 3   NITROSTAT 0.4 MG SL tablet, Place 1 tablet (0.4 mg total) under the tongue every 5 (five) minutes as needed for chest pain., Disp: 25 tablet, Rfl: 3   PARoxetine (PAXIL) 20 MG tablet, TAKE 1 TABLET  DAILY FOR MOOD, Disp: 90 tablet, Rfl: 3   Probiotic , Take 1 tablet  daily., Disp: , Rfl:    psyllium (METAMUCIL) 58.6 % packet, Take 1 packet by mouth daily., Disp: , Rfl:    zinc gluconate 50 MG tablet, Take 50 mg by mouth daily., Disp: , Rfl:    Allergies  Allergen Reactions   Cephalosporins Rash     PMHx:   Past Medical History:  Diagnosis Date   Anxiety    Chronic lower back pain    Depression    DJD (degenerative joint disease)    knees, neck   GERD (gastroesophageal reflux disease)    has had dilitation in the past   Hypertension      Immunization History  Administered Date(s) Administered   Fluad Quad(high Dose ) 12/04/2019, 12/21/2020   Influenza, High Dose  01/29/2018   Influenza 12/09/2016, 11/28/2018   Moderna Sars-Covid-2 Vacc 03/28/2019, 04/28/2019, 02/27/2020   Pneumococcal -13 02/12/2016, 09/30/2017   Pneumococcal -23 03/16/2006, 04/02/2008   Zoster, Live 07/31/2013     Past Surgical History:   Procedure Laterality Date   ABDOMINAL HYSTERECTOMY  03/1979   BREAST BIOPSY Left 1986 and 1987   CATARACT EXTRACTION, BILATERAL Bilateral 2014   Dr. Madelin Headings, in [place]    FHx:    Reviewed / unchanged  SHx:    Reviewed / unchanged   Systems Review:  Constitutional: Denies fever, chills, wt changes, headaches, insomnia, fatigue, night sweats, change in appetite. Eyes: Denies redness, blurred vision, diplopia, discharge, itchy, watery eyes.  ENT: Denies discharge, congestion, post nasal drip, epistaxis, sore throat, earache, hearing loss, dental pain, tinnitus, vertigo, sinus pain, snoring.  CV: Denies chest pain, palpitations, irregular heartbeat, syncope, dyspnea, diaphoresis, orthopnea, PND, claudication or edema. Respiratory: denies cough, dyspnea, DOE, pleurisy, hoarseness, laryngitis, wheezing.  Gastrointestinal: Denies dysphagia, odynophagia, heartburn, reflux, water brash, abdominal pain or cramps, nausea, vomiting, bloating, diarrhea, constipation, hematemesis, melena, hematochezia  or hemorrhoids. Genitourinary: Denies dysuria, frequency, urgency, nocturia, hesitancy, discharge, hematuria or flank pain. Musculoskeletal: Denies arthralgias, myalgias, stiffness, jt. swelling, pain, limping or strain/sprain.  Skin: Denies pruritus, rash, hives, warts, acne, eczema or change in skin lesion(s). Neuro: No weakness, tremor, incoordination, spasms, paresthesia or pain. Psychiatric: Denies confusion, memory loss or sensory loss. Endo: Denies change in weight, skin or hair change.  Heme/Lymph: No excessive bleeding, bruising or enlarged lymph nodes.  Physical Exam  There were no vitals taken for this visit.  Appears  over nourished  and in no distress.  Eyes: PERRLA, EOMs, conjunctiva no swelling or erythema. Sinuses: No frontal/maxillary tenderness ENT/Mouth: EAC's clear, TM's nl w/o erythema, bulging. Nares clear w/o erythema, swelling, exudates. Oropharynx clear without  erythema or exudates. Oral hygiene is good. Tongue normal, non obstructing. Hearing intact.  Neck: Supple. Thyroid not palpable. Car 2+/2+ without bruits, nodes or JVD. Chest: Respirations nl with BS clear & equal w/o rales, rhonchi, wheezing or stridor.  Cor: Heart sounds normal w/ regular rate and rhythm without sig. murmurs, gallops, clicks or rubs. Peripheral pulses normal and equal  without edema.  Abdomen: Soft & bowel sounds normal. Non-tender w/o guarding, rebound, hernias, masses or organomegaly.  Lymphatics: Unremarkable.  Musculoskeletal: Full ROM all peripheral extremities, joint stability,  5/5 strength and normal gait.  Skin: Warm, dry without exposed rashes, lesions or ecchymosis apparent.  Neuro: Cranial nerves intact, reflexes equal bilaterally. Sensory-motor testing grossly intact. Tendon reflexes grossly intact.  Pysch: Alert & oriented x 3.  Insight and judgement nl & appropriate. No ideations.  Assessment and Plan:  1. Labile hypertension    - Continue medication, monitor blood pressure at home.  - Continue DASH diet.  Reminder to go to the ER if any CP,  SOB, nausea, dizziness, severe HA, changes vision/speech.    - CBC with Differential/Platelet - COMPLETE METABOLIC PANEL WITH GFR - Magnesium - TSH  2. Hyperlipidemia, mixed  - Continue diet/meds, exercise,& lifestyle modifications.  - Continue monitor periodic cholesterol/liver & renal functions     - Lipid panel - TSH  3. Abnormal glucose  - Hemoglobin A1c - Insulin, random  4. Vitamin D deficiency  - Continue diet, exercise  - Lifestyle modifications.  - Monitor appropriate labs. - Continue supplementation.   - VITAMIN D 25 Hydroxy  5. Paroxysmal atrial fibrillation (HCC)  - TSH  6. Medication management  - CBC with Differential/Platelet - COMPLETE METABOLIC PANEL WITH GFR - Magnesium - Lipid panel - TSH - Hemoglobin A1c - Insulin, random - VITAMIN D 25 Hydroxy           Discussed   regular exercise, BP monitoring, weight control to achieve/maintain BMI less than 25 and discussed med and SE's. Recommended labs to assess /monitor clinical status .  I discussed the assessment and treatment plan with the patient. The patient was provided an opportunity to ask questions and all were answered. The patient agreed with the plan and demonstrated an understanding of the instructions.  I provided over 30 minutes of exam, counseling, chart review and  complex critical decision making.        The patient was advised to call back or seek an in-person evaluation if the symptoms worsen or if the condition fails to improve as anticipated.   Kirtland Bouchard, MD

## 2022-05-11 NOTE — Patient Instructions (Signed)

## 2022-05-12 ENCOUNTER — Ambulatory Visit (INDEPENDENT_AMBULATORY_CARE_PROVIDER_SITE_OTHER): Payer: Medicare Other | Admitting: Internal Medicine

## 2022-05-12 ENCOUNTER — Encounter: Payer: Self-pay | Admitting: Internal Medicine

## 2022-05-12 VITALS — BP 110/72 | HR 66 | Temp 97.9°F | Ht 70.0 in | Wt 230.0 lb

## 2022-05-12 DIAGNOSIS — E782 Mixed hyperlipidemia: Secondary | ICD-10-CM

## 2022-05-12 DIAGNOSIS — R0989 Other specified symptoms and signs involving the circulatory and respiratory systems: Secondary | ICD-10-CM

## 2022-05-12 DIAGNOSIS — E559 Vitamin D deficiency, unspecified: Secondary | ICD-10-CM | POA: Diagnosis not present

## 2022-05-12 DIAGNOSIS — R7309 Other abnormal glucose: Secondary | ICD-10-CM | POA: Diagnosis not present

## 2022-05-12 DIAGNOSIS — Z79899 Other long term (current) drug therapy: Secondary | ICD-10-CM | POA: Diagnosis not present

## 2022-05-13 LAB — CBC WITH DIFFERENTIAL/PLATELET
Absolute Monocytes: 431 cells/uL (ref 200–950)
Basophils Absolute: 41 cells/uL (ref 0–200)
Basophils Relative: 0.7 %
Eosinophils Absolute: 89 cells/uL (ref 15–500)
Eosinophils Relative: 1.5 %
HCT: 39.4 % (ref 35.0–45.0)
Hemoglobin: 13.3 g/dL (ref 11.7–15.5)
Lymphs Abs: 1304 cells/uL (ref 850–3900)
MCH: 32.2 pg (ref 27.0–33.0)
MCHC: 33.8 g/dL (ref 32.0–36.0)
MCV: 95.4 fL (ref 80.0–100.0)
MPV: 11.5 fL (ref 7.5–12.5)
Monocytes Relative: 7.3 %
Neutro Abs: 4036 cells/uL (ref 1500–7800)
Neutrophils Relative %: 68.4 %
Platelets: 193 10*3/uL (ref 140–400)
RBC: 4.13 10*6/uL (ref 3.80–5.10)
RDW: 12.8 % (ref 11.0–15.0)
Total Lymphocyte: 22.1 %
WBC: 5.9 10*3/uL (ref 3.8–10.8)

## 2022-05-13 LAB — COMPLETE METABOLIC PANEL WITH GFR
AG Ratio: 2.2 (calc) (ref 1.0–2.5)
ALT: 21 U/L (ref 6–29)
AST: 23 U/L (ref 10–35)
Albumin: 4.1 g/dL (ref 3.6–5.1)
Alkaline phosphatase (APISO): 53 U/L (ref 37–153)
BUN: 17 mg/dL (ref 7–25)
CO2: 27 mmol/L (ref 20–32)
Calcium: 9.3 mg/dL (ref 8.6–10.4)
Chloride: 106 mmol/L (ref 98–110)
Creat: 0.72 mg/dL (ref 0.60–0.95)
Globulin: 1.9 g/dL (calc) (ref 1.9–3.7)
Glucose, Bld: 97 mg/dL (ref 65–99)
Potassium: 3.9 mmol/L (ref 3.5–5.3)
Sodium: 142 mmol/L (ref 135–146)
Total Bilirubin: 1.1 mg/dL (ref 0.2–1.2)
Total Protein: 6 g/dL — ABNORMAL LOW (ref 6.1–8.1)
eGFR: 83 mL/min/{1.73_m2} (ref >=60)

## 2022-05-13 LAB — HEMOGLOBIN A1C
Hgb A1c MFr Bld: 5.8 % of total Hgb — ABNORMAL HIGH (ref <5.7)
Mean Plasma Glucose: 120 mg/dL
eAG (mmol/L): 6.6 mmol/L

## 2022-05-13 LAB — INSULIN, RANDOM: Insulin: 6.5 u[IU]/mL

## 2022-05-13 LAB — LIPID PANEL
Cholesterol: 185 mg/dL (ref <200)
HDL: 64 mg/dL (ref >=50)
LDL Cholesterol (Calc): 103 mg/dL (calc) — ABNORMAL HIGH
Non-HDL Cholesterol (Calc): 121 mg/dL (calc) (ref <130)
Total CHOL/HDL Ratio: 2.9 (calc) (ref <5.0)
Triglycerides: 89 mg/dL (ref <150)

## 2022-05-13 LAB — TSH: TSH: 2.32 mIU/L (ref 0.40–4.50)

## 2022-05-13 LAB — MAGNESIUM: Magnesium: 1.9 mg/dL (ref 1.5–2.5)

## 2022-05-13 LAB — VITAMIN D 25 HYDROXY (VIT D DEFICIENCY, FRACTURES): Vit D, 25-Hydroxy: 84 ng/mL (ref 30–100)

## 2022-05-13 NOTE — Progress Notes (Signed)
<><><><><><><><><><><><><><><><><><><><><><><><><><><><><><><><><> <><><><><><><><><><><><><><><><><><><><><><><><><><><><><><><><><>  -   Total Chol = 185 Great  <><><><><><><><><><><><><><><><><><><><><><><><><><><><><><><><><>  - A1c = 5.8% - Still borderline  ( Goal is less than 5.7%)    - Avoid Sweets, Candy & White Stuff   - White Rice, White Potatoes, White Flour  - Breads &  Pasta  <><><><><><><><><><><><><><><><><><><><><><><><><><><><><><><><><>  -  Vitamin D = 84 - Excellent - Please keep dosing same  <><><><><><><><><><><><><><><><><><><><><><><><><><><><><><><><><>  -  All Else - CBC - Kidneys - Electrolytes - Liver - Magnesium & Thyroid    - all  Normal / OK ===========================================================

## 2022-05-22 ENCOUNTER — Encounter: Payer: Self-pay | Admitting: Nurse Practitioner

## 2022-05-22 ENCOUNTER — Ambulatory Visit
Admission: RE | Admit: 2022-05-22 | Discharge: 2022-05-22 | Disposition: A | Discharge status: Home / Self Care | Payer: Medicare Other | Source: Ambulatory Visit | Attending: Nurse Practitioner | Admitting: Nurse Practitioner

## 2022-05-22 ENCOUNTER — Other Ambulatory Visit: Payer: Self-pay

## 2022-05-22 ENCOUNTER — Ambulatory Visit (INDEPENDENT_AMBULATORY_CARE_PROVIDER_SITE_OTHER): Payer: Medicare Other | Admitting: Nurse Practitioner

## 2022-05-22 VITALS — BP 110/72 | HR 93 | Temp 97.7°F | Ht 70.0 in | Wt 224.0 lb

## 2022-05-22 DIAGNOSIS — J988 Other specified respiratory disorders: Secondary | ICD-10-CM

## 2022-05-22 DIAGNOSIS — R051 Acute cough: Secondary | ICD-10-CM

## 2022-05-22 DIAGNOSIS — R6889 Other general symptoms and signs: Secondary | ICD-10-CM | POA: Diagnosis not present

## 2022-05-22 DIAGNOSIS — Z1152 Encounter for screening for COVID-19: Secondary | ICD-10-CM | POA: Diagnosis not present

## 2022-05-22 DIAGNOSIS — R062 Wheezing: Secondary | ICD-10-CM

## 2022-05-22 DIAGNOSIS — R0609 Other forms of dyspnea: Secondary | ICD-10-CM

## 2022-05-22 DIAGNOSIS — R0989 Other specified symptoms and signs involving the circulatory and respiratory systems: Secondary | ICD-10-CM

## 2022-05-22 DIAGNOSIS — R300 Dysuria: Secondary | ICD-10-CM

## 2022-05-22 DIAGNOSIS — R059 Cough, unspecified: Secondary | ICD-10-CM | POA: Diagnosis not present

## 2022-05-22 LAB — POC COVID19 BINAXNOW: SARS Coronavirus 2 Ag: NEGATIVE

## 2022-05-22 LAB — POCT INFLUENZA A/B
Influenza A, POC: NEGATIVE
Influenza B, POC: NEGATIVE

## 2022-05-22 MED ORDER — AZITHROMYCIN 500 MG PO TABS: 500.0000 mg | ORAL_TABLET | Freq: Every day | ORAL | 0 refills | Status: DC

## 2022-05-22 MED ORDER — PREDNISONE 10 MG PO TABS: ORAL_TABLET | ORAL | 0 refills | Status: DC

## 2022-05-22 MED ORDER — ALBUTEROL SULFATE HFA 108 (90 BASE) MCG/ACT IN AERS: 2.0000 | INHALATION_SPRAY | Freq: Four times a day (QID) | RESPIRATORY_TRACT | 2 refills | Status: AC | PRN

## 2022-05-22 NOTE — Patient Instructions (Signed)
Dehydration, Adult Dehydration is a condition in which there is not enough water or other fluids in the body. This happens when a person loses more fluids than they take in. Important organs cannot work right without the right amount of fluids. Any loss of fluids from the body can cause dehydration. Dehydration can be mild, worse, or very bad. It should be treated right away to keep it from getting very bad. What are the causes? Conditions that cause loss of water in the body. They include: Watery poop (diarrhea). Vomiting. Sweating a lot. Fever. Infection. Peeing (urinating) a lot. Not drinking enough fluids. Certain medicines, such as medicines that take extra fluid out of the body (diuretics). Lack of safe drinking water. Not being able to get enough water and food. What increases the risk? Having a long-term (chronic) illness that has not been treated the right way, such as: Diabetes. Heart disease. Kidney disease. Being 65 years of age or older. Having a disability. Living in a place that is high above the ground or sea (high in altitude). The thinner, drier air causes more fluid loss. Doing exercises that put stress on your body for a long time. Being active when in hot places. What are the signs or symptoms? Symptoms of dehydration depend on how bad it is. Mild or worse dehydration Thirst. Dry lips or dry mouth. Feeling dizzy or light-headed. Muscle cramps. Passing little pee or dark pee. Pee may be the color of tea. Headache. Very bad dehydration Changes in skin. Skin may: Be cold to the touch (clammy). Be blotchy or pale. Not go back to normal right after you pinch it and let it go. Little or no tears, pee, or sweat. Fast breathing. Low blood pressure. Weak pulse. Pulse that is more than 100 beats a minute when you are sitting still. Other changes, such as: Feeling very thirsty. Eyes that look hollow (sunken). Cold hands and feet. Being confused. Being very  tired (lethargic) or having trouble waking from sleep. Losing weight. Loss of consciousness. How is this treated? Treatment for this condition depends on how bad your dehydration is. Treatment should start right away. Do not wait until your condition gets very bad. Very bad dehydration is an emergency. You will need to go to a hospital. Mild or worse dehydration can be treated at home. You may be asked to: Drink more fluids. Drink an oral rehydration solution (ORS). This drink gives you the right amount of fluids, salts, and minerals (electrolytes). Very bad dehydration can be treated: With fluids through an IV tube. By correcting low levels of electrolytes in the body. By treating the problem that caused your dehydration. Follow these instructions at home: Oral rehydration solution If told by your doctor, drink an ORS: Make an ORS. Use instructions on the package. Start by drinking small amounts, about  cup (120 mL) every 5-10 minutes. Slowly drink more until you have had the amount that your doctor said to have.  Eating and drinking  Drink enough clear fluid to keep your pee pale yellow. If you were told to drink an ORS, finish the ORS first. Then, start slowly drinking other clear fluids. Drink fluids such as: Water. Do not drink only water. Doing that can make the salt (sodium) level in your body get too low. Water from ice chips you suck on. Fruit juice that you have added water to (diluted). Low-calorie sports drinks. Eat foods that have the right amounts of salts and minerals, such as bananas, oranges, potatoes,   tomatoes, or spinach. Do not drink alcohol. Avoid drinks that have caffeine or sugar. These include:: High-calorie sports drinks. Fruit juice that you did not add water to. Soda. Coffee or energy drinks. Avoid foods that are greasy or have a lot of fat or sugar. General instructions Take over-the-counter and prescription medicines only as told by your doctor. Do  not take sodium tablets. Doing that can make the salt level in your body get too high. Return to your normal activities as told by your doctor. Ask your doctor what activities are safe for you. Keep all follow-up visits. Your doctor may check and change your treatment. Contact a doctor if: You have pain in your belly (abdomen) and the pain: Gets worse. Stays in one place. You have a rash. You have a stiff neck. You get angry or annoyed more easily than normal. You are more tired or have a harder time waking than normal. You feel weak or dizzy. You feel very thirsty. Get help right away if: You have any symptoms of very bad dehydration. You vomit every time you eat or drink. Your vomiting gets worse, does not go away, or you vomit blood or green stuff. You are getting treatment, but symptoms are getting worse. You have a fever. You have a very bad headache. You have: Diarrhea that gets worse or does not go away. Blood in your poop (stool). This may cause poop to look black and tarry. No pee in 6-8 hours. Only a small amount of pee in 6-8 hours, and the pee is very dark. You have trouble breathing. These symptoms may be an emergency. Get help right away. Call 911. Do not wait to see if the symptoms will go away. Do not drive yourself to the hospital. This information is not intended to replace advice given to you by your health care provider. Make sure you discuss any questions you have with your health care provider. Document Revised: 09/29/2021 Document Reviewed: 09/29/2021 Elsevier Patient Education  2023 [employer] Inc.  

## 2022-05-22 NOTE — Progress Notes (Signed)
Assessment and Plan:  Darlene [name] was seen today for an episodic visit.  Diagnoses and all order for this visit:  Flu-like symptoms Negative   - POCT Influenza A/B  Encounter for screening for COVID-19 Negative   - POC COVID-19  Respiratory infection/cough/rhonchi/wheezing/DOE Start tmt with abx. Start Prednisone as directed Albuterol inhaler as directed as needed Complete CXR for evaluation of any underlying etiology given age and length of symptoms.  Push fluids Report to ER for any increase in difficulty breathing  - azithromycin (ZITHROMAX) 500 MG tablet; Take 1 tablet (500 mg total) by mouth daily for 7 days.  Dispense: 7 tablet; Refill: 0 - predniSONE (DELTASONE) 10 MG tablet; 1 tab 3 x day for 2 days, then 1 tab 2 x day for 2 days, then 1 tab 1 x day for 3 days  Dispense: 13 tablet; Refill: 0 - albuterol (VENTOLIN HFA) 108 (90 Base) MCG/ACT inhaler; Inhale 2 puffs into the lungs every 6 (six) hours as needed for wheezing or shortness of breath.  Dispense: 8 g; Refill: 2 - DG Chest 2 View; Future  Dysuria Continue to stay well hydrated to keep urinary system well flushed. Report to ER for any increase in AMS, accompanied by fever, chills, N/V.  - Culture, Urine - Urinalysis, Routine w reflex microscopic  Notify office for further evaluation and treatment, questions or concerns if s/s fail to improve. The risks and benefits of my recommendations, as well as other treatment options were discussed with the patient today. Questions were answered.  Further disposition pending results of labs. Discussed med's effects and SE's.    Over 15 minutes of exam, counseling, chart review, and critical decision making was performed.   Future Appointments  Date Time Provider [place]  05/27/2022  1:20 PM [facility] 2 [facility]  08/13/2022 11:30 AM Darrol Jump, NP [facility] None  11/05/2022  3:40 PM Arnoldo Lenis, MD [facility]  12/24/2022   3:00 PM [name], MD [facility] None  02/18/2023  2:00 PM Lomax, Amy, NP [facility] None    ------------------------------------------------------------------------------------------------------------------   HPI BP 110/72   Pulse 93   Temp 97.7 F (36.5 C)   Ht '5\' 10"'$  (1.778 m)   Wt 224 lb (101.6 kg)   SpO2 97%   BMI 32.14 kg/m   Patient complains of symptoms of a URI. Symptoms include congestion, cough described as productive, headache described as pressure, nasal congestion, shortness of breath, and wheezing. Onset of symptoms was 10 days ago, and has been unchanged since that time. Treatment to date: none.  Denies fever, chills, N/V     Patient also complains of dysuria, frequency, and urgency. She has had symptoms for 1 day. Patient denies back pain, fever, stomach ache, and vaginal discharge. Patient does not have a history of recurrent UTI. Patient does not have a history of pyelonephritis.    Past Medical History:  Diagnosis Date   Anxiety    Chronic lower back pain    Depression    DJD (degenerative joint disease)    knees, neck   GERD (gastroesophageal reflux disease)    has had dilitation in the past   Hypertension      Allergies  Allergen Reactions   Cephalosporins Rash    Current Outpatient Medications on File Prior to Visit  Medication Sig   acetaminophen (TYLENOL) 500 MG tablet Take 500 mg by mouth every 6 (six) hours as needed.   apixaban (ELIQUIS) 5 MG TABS tablet Take  1 tablet (5 mg total) by mouth 2 (two) times daily.   Ascorbic Acid (VITAMIN C) 500 MG CAPS Take 500 mg by mouth.    benzonatate (TESSALON PERLES) 100 MG capsule Take 1 capsule (100 mg total) by mouth every 6 (six) hours as needed for cough.   busPIRone (BUSPAR) 10 MG tablet TAKE 1/2 TO 1 TABLET BY MOUTH 3  TIMES DAILY AS NEEDED FOR  ANXIETY, MOOD OR IRRITABILITY   CHOLECALCIFEROL PO Take 5,000 Units by mouth daily.   Cyanocobalamin 2500 MCG TABS Take 2,500 mcg by mouth daily.    cyclobenzaprine (FLEXERIL) 10 MG tablet Take 1/2 to 1 tablet    2 to 3 x /day if needed for Muscle Spasms   diltiazem (CARDIZEM CD) 300 MG 24 hr capsule Take 1 capsule (300 mg total) by mouth daily.   escitalopram (LEXAPRO) 20 MG tablet Take 1 tablet Daily for Mood, Anxiety & Irritability   ferrous sulfate 325 (65 FE) MG tablet Take 1 tablet (325 mg total) by mouth every other day.   ipratropium-albuterol (DUONEB) 0.5-2.5 (3) MG/3ML SOLN USE 1 VIAL IN NEBULIZER EVERY 4 HOURS   lansoprazole (PREVACID) 30 MG capsule TAKE 1 CAPSULE BY MOUTH  DAILY FOR HEARTBURN AND  INDIGESTION   Magnesium 250 MG TABS Take by mouth daily.   metoprolol tartrate (LOPRESSOR) 25 MG tablet TAKE 2 TABLETS BY MOUTH IN THE  MORNING AND 1 TABLET BY MOUTH IN THE EVENING   nitroGLYCERIN (NITROSTAT) 0.4 MG SL tablet Place 1 tablet (0.4 mg total) under the tongue every 5 (five) minutes as needed for chest pain.   oxybutynin (DITROPAN-XL) 10 MG 24 hr tablet Take 1 tablet  Daily for Bladder Control   Probiotic Product (PROBIOTIC DAILY PO) Take 1 tablet by mouth daily.   psyllium (METAMUCIL) 58.6 % packet Take 1 packet by mouth daily.   zinc gluconate 50 MG tablet Take 50 mg by mouth daily.   No current facility-administered medications on file prior to visit.    ROS: all negative except what is noted in the HPI.   Physical Exam:  BP 110/72   Pulse 93   Temp 97.7 F (36.5 C)   Ht '5\' 10"'$  (1.778 m)   Wt 224 lb (101.6 kg)   SpO2 97%   BMI 32.14 kg/m   General Appearance: NAD.  Awake, conversant and cooperative. Eyes: PERRLA, EOMs intact.  Sclera white.  Conjunctiva without erythema. Sinuses: No frontal/maxillary tenderness.  No nasal discharge. Nares patent.  ENT/Mouth: Ext aud canals clear.  Bilateral TMs w/DOL and without erythema or bulging. Hearing intact.  Posterior pharynx without swelling or exudate.  Tonsils without swelling or erythema.  Neck: Supple.  No masses, nodules or thyromegaly. Respiratory: Effort is  regular with non-labored breathing. Breath sounds are equal bilaterally with posterior rhonchi in lower lung bases and scattered wheezing throughout entire lung field.   Cardio: RRR with no MRGs. Brisk peripheral pulses without edema.  Abdomen: Active BS in all four quadrants.  Soft and non-tender without guarding, rebound tenderness, hernias or masses. Lymphatics: Non tender without lymphadenopathy.  Musculoskeletal: Full ROM, 5/5 strength, normal ambulation.  No clubbing or cyanosis. Skin: Appropriate color for ethnicity. Warm without rashes, lesions, ecchymosis, ulcers.  Neuro: CN II-XII grossly normal. Normal muscle tone without cerebellar symptoms and intact sensation.   Psych: AO X 3,  appropriate mood and affect, insight and judgment.     Darrol Jump, NP 12:18 PM [facility]

## 2022-05-24 ENCOUNTER — Other Ambulatory Visit: Payer: Self-pay | Admitting: Nurse Practitioner

## 2022-05-24 DIAGNOSIS — N3000 Acute cystitis without hematuria: Secondary | ICD-10-CM

## 2022-05-24 LAB — URINALYSIS, ROUTINE W REFLEX MICROSCOPIC
Bilirubin Urine: NEGATIVE
Glucose, UA: NEGATIVE
Hyaline Cast: NONE SEEN /LPF
Nitrite: POSITIVE — AB
Protein, ur: NEGATIVE
RBC / HPF: NONE SEEN /HPF (ref 0–2)
Specific Gravity, Urine: 1.02 (ref 1.001–1.035)
pH: 5.5 (ref 5.0–8.0)

## 2022-05-24 LAB — URINE CULTURE
MICRO NUMBER:: 14669092
SPECIMEN QUALITY:: ADEQUATE

## 2022-05-24 LAB — MICROSCOPIC MESSAGE

## 2022-05-24 MED ORDER — NITROFURANTOIN MONOHYD MACRO 100 MG PO CAPS: 100.0000 mg | ORAL_CAPSULE | Freq: Two times a day (BID) | ORAL | 0 refills | Status: AC

## 2022-05-27 ENCOUNTER — Ambulatory Visit
Admission: RE | Admit: 2022-05-27 | Discharge: 2022-05-27 | Disposition: A | Discharge status: Home / Self Care | Payer: Medicare Other | Source: Ambulatory Visit | Attending: Internal Medicine | Admitting: Internal Medicine

## 2022-05-27 DIAGNOSIS — Z1231 Encounter for screening mammogram for malignant neoplasm of breast: Secondary | ICD-10-CM | POA: Diagnosis not present

## 2022-05-28 ENCOUNTER — Telehealth: Payer: Self-pay

## 2022-05-28 NOTE — Telephone Encounter (Signed)
Spoke with [name], patients daughter, she said she has no energy, is lethargic, and slurring her words. I spoke with Kenney Houseman and she said she needs to go to Urgent Care or the ER. Told the daughter this and she understands. She is going to see how she is in the morning and make her go to Urgent Care or the ER.

## 2022-05-29 ENCOUNTER — Other Ambulatory Visit: Payer: Self-pay | Admitting: Nurse Practitioner

## 2022-05-29 DIAGNOSIS — R051 Acute cough: Secondary | ICD-10-CM

## 2022-05-29 DIAGNOSIS — R062 Wheezing: Secondary | ICD-10-CM

## 2022-05-29 DIAGNOSIS — J988 Other specified respiratory disorders: Secondary | ICD-10-CM

## 2022-05-29 DIAGNOSIS — R0609 Other forms of dyspnea: Secondary | ICD-10-CM

## 2022-05-29 DIAGNOSIS — R0989 Other specified symptoms and signs involving the circulatory and respiratory systems: Secondary | ICD-10-CM

## 2022-05-29 MED ORDER — AZITHROMYCIN 500 MG PO TABS: 500.0000 mg | ORAL_TABLET | Freq: Every day | ORAL | 0 refills | Status: AC

## 2022-05-29 MED ORDER — PREDNISONE 10 MG PO TABS: ORAL_TABLET | ORAL | 0 refills | Status: DC

## 2022-06-01 ENCOUNTER — Other Ambulatory Visit: Payer: Self-pay | Admitting: Internal Medicine

## 2022-06-01 DIAGNOSIS — R928 Other abnormal and inconclusive findings on diagnostic imaging of breast: Secondary | ICD-10-CM

## 2022-06-02 ENCOUNTER — Ambulatory Visit (INDEPENDENT_AMBULATORY_CARE_PROVIDER_SITE_OTHER): Payer: Medicare Other | Admitting: Nurse Practitioner

## 2022-06-02 ENCOUNTER — Ambulatory Visit
Admission: RE | Admit: 2022-06-02 | Discharge: 2022-06-02 | Disposition: A | Discharge status: Home / Self Care | Payer: Medicare Other | Source: Ambulatory Visit | Attending: Nurse Practitioner | Admitting: Nurse Practitioner

## 2022-06-02 VITALS — BP 110/74 | HR 78 | Temp 97.5°F | Ht 70.0 in | Wt 215.4 lb

## 2022-06-02 DIAGNOSIS — R918 Other nonspecific abnormal finding of lung field: Secondary | ICD-10-CM | POA: Diagnosis not present

## 2022-06-02 DIAGNOSIS — N3000 Acute cystitis without hematuria: Secondary | ICD-10-CM | POA: Diagnosis not present

## 2022-06-02 DIAGNOSIS — J189 Pneumonia, unspecified organism: Secondary | ICD-10-CM

## 2022-06-02 DIAGNOSIS — J439 Emphysema, unspecified: Secondary | ICD-10-CM | POA: Diagnosis not present

## 2022-06-02 DIAGNOSIS — R197 Diarrhea, unspecified: Secondary | ICD-10-CM | POA: Diagnosis not present

## 2022-06-02 NOTE — Patient Instructions (Signed)
Clostridioides Difficile Infection ?Clostridioides difficile infection, or C. diff, is an infection that is caused by C. diff germs (bacteria). ?This infection may happen after you take antibiotics that kill other germs and let C. diff germs grow. C. diff can be spread from person to person (is contagious). ?What are the causes? ?Taking certain antibiotics. ?Coming in contact with people, food, or things that have C. diff. ?What increases the risk? ?Taking certain antibiotics for a long time. ?Staying in a hospital or long-term care facility for a long time. ?Being age 65 or older. ?Having had C. diff before or been exposed to C. diff. ?Having a weak disease-fighting system (immune system). ?Taking medicines that treat stomach acid. ?Having serious health problems, including: ?Colon cancer. ?Inflammatory bowel disease (IBD). ?Having had a procedure or surgery on your digestive system. ?What are the signs or symptoms? ?Watery poop (diarrhea). ?Fever. ?Not feeling hungry. ?Feeling like you may vomit. ?Swelling, pain, cramps, or a tender belly. ?How is this treated? ?Treatment may include: ?Stopping the antibiotics that caused the C. diff infection. ?Taking antibiotics that kill C. diff. ?Placing poop from a healthy person into your colon (fecal transplant). ?Doing surgery to take out the infected part of the colon. ?Follow these instructions at home: ?Medicines ?Take over-the-counter and prescription medicines only as told by your doctor. ?Take antibiotic medicine as told by your doctor. Do not stop taking it even if you start to feel better. ?Do not take medicines to treat watery poop unless your doctor tells you to. ?Eating and drinking ? ?Follow instructions from your doctor about what to eat and drink. This may include eating bland foods in small amounts, such as: ?Bananas. ?Applesauce. ?Rice. ?Lean meats. ?Toast. ?Crackers. ?To prevent loss of fluid in your body (dehydration): ?Take in enough fluids to keep your  pee pale yellow. This includes water, ice chips, clear fruit juice with water added to it, or low-calorie sports drinks. ?Take an ORS (oral rehydration solution). This drink is sold in pharmacies and retail stores. ?Avoid milk, caffeine, and alcohol. ?General instructions ?Wash your hands often with soap and water. Do this for at least 20 seconds. ?Take a bath or shower every day. ?Return to your normal activities when your doctor says that it is safe. ?Keep all follow-up visits. ?How is this prevented? ?Personal hygiene ? ?Wash your hands often with soap and water. Do this for at least 20 seconds. ?Wash your hands before you cook and after you use the bathroom. ?Other people should wash their hands too, especially: ?People who live with you. ?People who visit you in a hospital or clinic. ?Contact precautions ?If you get watery poop while you are in the hospital or a long-term care facility, tell your doctor right away. ?When you visit someone in the hospital or a long-term care facility, wear a gown, gloves, or other protection. ?If possible: ?Stay away from people who have diarrhea. ?Use a separate bathroom if you are sick and live with other people. ?Clean environment ?Keep your home clean. ?Clean your home every day for at least a week after you leave the hospital. ?Clean surfaces that you touch every day. Use a product that has a 10% chlorine bleach solution. Be sure to: ?Read the label on your product to make sure that the product will kill the germs on your surfaces. ?Clean toilets and flush handles, bathtubs, sinks, doorknobs and handles, countertops, and work surfaces. ?If you are in the hospital, make sure the surfaces in your room are   cleaned each day. Tell someone right away if body fluids have splashed or spilled. ?Clothes and linens ?Wash clothes and linens using laundry soap that has chlorine bleach. Be sure to: ?Use powder soap instead of liquid. ?Clean your washing machine once a month. To do this,  turn on the hot setting with only soap in it. ?Contact a doctor if: ?Your symptoms do not get better or they get worse. ?Your symptoms go away and then come back. ?You have a fever. ?You have new symptoms. ?Get help right away if: ?Your belly is more tender or you have more pain. ?Your poop is mostly bloody. ?Your poop looks black. ?You vomit after you eat or drink. ?You have signs of not having enough fluids in your body. These include: ?Dark yellow pee, very little pee, or no pee. ?Cracked lips or dry mouth. ?No tears when you cry. ?Sunken eyes. ?Feeling sleepy. ?Feeling weak or dizzy. ?Summary ?C. diff infection is an infection that may happen after you take antibiotic medicines. ?Symptoms include watery poop, fever, not feeling hungry, or feeling like you may vomit. ?Treatment includes stopping the antibiotics that made you sick and taking antibiotics that kill the C. diff germs. Poop from a healthy person may also be placed into your colon. ?To prevent C. diff infectionfrom spreading, wash hands often with soap and water. Do this for at least 20 seconds. Keep your home clean. ?This information is not intended to replace advice given to you by your health care provider. Make sure you discuss any questions you have with your health care provider. ?Document Revised: 06/22/2019 Document Reviewed: 06/22/2019 ?Elsevier Patient Education ? 2023 [employer] ? ?

## 2022-06-02 NOTE — Progress Notes (Signed)
Assessment and Plan:  Darlene [name] was seen today for an episodic visit.  Diagnoses and all order for this visit:  Flu-like symptoms Negative   - POCT Influenza A/B  Encounter for screening for COVID-19 Negative   - POC COVID-19  Respiratory infection/cough/rhonchi/wheezing/DOE Start tmt with abx. Start Prednisone as directed Albuterol inhaler as directed as needed Complete CXR for evaluation of any underlying etiology given age and length of symptoms.  Push fluids Report to ER for any increase in difficulty breathing  - azithromycin (ZITHROMAX) 500 MG tablet; Take 1 tablet (500 mg total) by mouth daily for 7 days.  Dispense: 7 tablet; Refill: 0 - predniSONE (DELTASONE) 10 MG tablet; 1 tab 3 x day for 2 days, then 1 tab 2 x day for 2 days, then 1 tab 1 x day for 3 days  Dispense: 13 tablet; Refill: 0 - albuterol (VENTOLIN HFA) 108 (90 Base) MCG/ACT inhaler; Inhale 2 puffs into the lungs every 6 (six) hours as needed for wheezing or shortness of breath.  Dispense: 8 g; Refill: 2 - DG Chest 2 View; Future  Dysuria Continue to stay well hydrated to keep urinary system well flushed. Report to ER for any increase in AMS, accompanied by fever, chills, N/V.  - Culture, Urine - Urinalysis, Routine w reflex microscopic  Watery diarrhea/Presumed infectious diarrhea Will check for C-Diff considering recent tmt with abx. Stay well hydrated.  - C. Diff   Notify office for further evaluation and treatment, questions or concerns if s/s fail to improve. The risks and benefits of my recommendations, as well as other treatment options were discussed with the patient today. Questions were answered.  Further disposition pending results of labs. Discussed med's effects and SE's.    Over 15 minutes of exam, counseling, chart review, and critical decision making was performed.   Future Appointments  Date Time Provider [place]  06/10/2022  9:50 AM [facility] [place] 3 [facility]  [facility]  06/10/2022 10:00 AM [facility] [place] 3 [facility]  08/13/2022 11:30 AM Darrol Jump, NP [facility] None  11/05/2022  3:40 PM Arnoldo Lenis, MD [facility]  12/24/2022  3:00 PM [name], MD [facility] None  02/18/2023  2:00 PM Lomax, Amy, NP [facility] None    ------------------------------------------------------------------------------------------------------------------   HPI BP 110/74   Pulse 78   Temp (!) 97.5 F (36.4 C)   Ht 5\' 10"  (1.778 m)   Wt 215 lb 6.4 oz (97.7 kg)   SpO2 98%   BMI 30.91 kg/m   Patient complains of symptoms of a URI. Symptoms include congestion, cough described as productive, headache described as pressure, nasal congestion, shortness of breath, and wheezing. Onset of symptoms was 10 days ago, and has been unchanged since that time. Treatment to date: none.  Denies fever, chills, N/V    Patient also complains of dysuria, frequency, and urgency. She has had symptoms for 1 day. Patient denies back pain, fever, stomach ache, and vaginal discharge. Patient does not have a history of recurrent UTI. Patient does not have a history of pyelonephritis.   She has noticed watery diarrhea over the last several days.  Has "pungent" smell.  She has had two course of abx treatment over the last week.  Denies fever, chills, N/V.    Past Medical History:  Diagnosis Date   Anxiety    Chronic lower back pain    Depression    DJD (degenerative joint disease)    knees, neck   GERD (  gastroesophageal reflux disease)    has had dilitation in the past   Hypertension      Allergies  Allergen Reactions   Cephalosporins Rash    Current Outpatient Medications on File Prior to Visit  Medication Sig   acetaminophen (TYLENOL) 500 MG tablet Take 500 mg by mouth every 6 (six) hours as needed.   albuterol (VENTOLIN HFA) 108 (90 Base) MCG/ACT inhaler Inhale 2 puffs into the lungs every 6 (six) hours as needed for wheezing or shortness of  breath.   apixaban (ELIQUIS) 5 MG TABS tablet Take 1 tablet (5 mg total) by mouth 2 (two) times daily.   Ascorbic Acid (VITAMIN C) 500 MG CAPS Take 500 mg by mouth.    azithromycin (ZITHROMAX) 500 MG tablet Take 1 tablet (500 mg total) by mouth daily for 5 days.   benzonatate (TESSALON PERLES) 100 MG capsule Take 1 capsule (100 mg total) by mouth every 6 (six) hours as needed for cough.   busPIRone (BUSPAR) 10 MG tablet TAKE 1/2 TO 1 TABLET BY MOUTH 3  TIMES DAILY AS NEEDED FOR  ANXIETY, MOOD OR IRRITABILITY   CHOLECALCIFEROL PO Take 5,000 Units by mouth daily.   Cyanocobalamin 2500 MCG TABS Take 2,500 mcg by mouth daily.   cyclobenzaprine (FLEXERIL) 10 MG tablet Take 1/2 to 1 tablet    2 to 3 x /day if needed for Muscle Spasms   diltiazem (CARDIZEM CD) 300 MG 24 hr capsule Take 1 capsule (300 mg total) by mouth daily.   escitalopram (LEXAPRO) 20 MG tablet Take 1 tablet Daily for Mood, Anxiety & Irritability   ferrous sulfate 325 (65 FE) MG tablet Take 1 tablet (325 mg total) by mouth every other day.   ipratropium-albuterol (DUONEB) 0.5-2.5 (3) MG/3ML SOLN USE 1 VIAL IN NEBULIZER EVERY 4 HOURS   lansoprazole (PREVACID) 30 MG capsule TAKE 1 CAPSULE BY MOUTH  DAILY FOR HEARTBURN AND  INDIGESTION   Magnesium 250 MG TABS Take by mouth daily.   metoprolol tartrate (LOPRESSOR) 25 MG tablet TAKE 2 TABLETS BY MOUTH IN THE  MORNING AND 1 TABLET BY MOUTH IN THE EVENING   nitroGLYCERIN (NITROSTAT) 0.4 MG SL tablet Place 1 tablet (0.4 mg total) under the tongue every 5 (five) minutes as needed for chest pain.   oxybutynin (DITROPAN-XL) 10 MG 24 hr tablet Take 1 tablet  Daily for Bladder Control   predniSONE (DELTASONE) 10 MG tablet 1 tab 3 x day for 2 days, then 1 tab 2 x day for 2 days, then 1 tab 1 x day for 3 days   Probiotic Product (PROBIOTIC DAILY PO) Take 1 tablet by mouth daily.   psyllium (METAMUCIL) 58.6 % packet Take 1 packet by mouth daily.   zinc gluconate 50 MG tablet Take 50 mg by mouth  daily.   No current facility-administered medications on file prior to visit.    ROS: all negative except what is noted in the HPI.   Physical Exam:  BP 110/74   Pulse 78   Temp (!) 97.5 F (36.4 C)   Ht 5\' 10"  (1.778 m)   Wt 215 lb 6.4 oz (97.7 kg)   SpO2 98%   BMI 30.91 kg/m   General Appearance: NAD.  Awake, conversant and cooperative. Eyes: PERRLA, EOMs intact.  Sclera white.  Conjunctiva without erythema. Sinuses: No frontal/maxillary tenderness.  No nasal discharge. Nares patent.  ENT/Mouth: Ext aud canals clear.  Bilateral TMs w/DOL and without erythema or bulging. Hearing intact.  Posterior pharynx  without swelling or exudate.  Tonsils without swelling or erythema.  Neck: Supple.  No masses, nodules or thyromegaly. Respiratory: Effort is regular with non-labored breathing. Breath sounds are equal bilaterally with posterior rhonchi in lower lung bases and scattered wheezing throughout entire lung field.   Cardio: RRR with no MRGs. Brisk peripheral pulses without edema.  Abdomen: Active BS in all four quadrants.  Soft and non-tender without guarding, rebound tenderness, hernias or masses. Lymphatics: Non tender without lymphadenopathy.  Musculoskeletal: Full ROM, 5/5 strength, normal ambulation.  No clubbing or cyanosis. Skin: Appropriate color for ethnicity. Warm without rashes, lesions, ecchymosis, ulcers.  Neuro: CN II-XII grossly normal. Normal muscle tone without cerebellar symptoms and intact sensation.   Psych: AO X 3,  appropriate mood and affect, insight and judgment.     Darrol Jump, NP 11:57 AM [facility]

## 2022-06-03 DIAGNOSIS — G4733 Obstructive sleep apnea (adult) (pediatric): Secondary | ICD-10-CM | POA: Diagnosis not present

## 2022-06-04 ENCOUNTER — Other Ambulatory Visit: Payer: Self-pay | Admitting: Nurse Practitioner

## 2022-06-04 LAB — URINALYSIS, ROUTINE W REFLEX MICROSCOPIC
Bacteria, UA: NONE SEEN /HPF
Bilirubin Urine: NEGATIVE
Glucose, UA: NEGATIVE
Hgb urine dipstick: NEGATIVE
Hyaline Cast: NONE SEEN /LPF
Nitrite: NEGATIVE
Specific Gravity, Urine: 1.026 (ref 1.001–1.035)
WBC, UA: NONE SEEN /HPF (ref 0–5)
pH: 6 (ref 5.0–8.0)

## 2022-06-04 LAB — URINE CULTURE
MICRO NUMBER:: 14712880
SPECIMEN QUALITY:: ADEQUATE

## 2022-06-04 MED ORDER — AZITHROMYCIN 500 MG PO TABS: 500.0000 mg | ORAL_TABLET | Freq: Every day | ORAL | 0 refills | Status: AC

## 2022-06-10 ENCOUNTER — Ambulatory Visit: Payer: Medicare Other

## 2022-06-10 ENCOUNTER — Ambulatory Visit
Admission: RE | Admit: 2022-06-10 | Discharge: 2022-06-10 | Disposition: A | Discharge status: Home / Self Care | Payer: Medicare Other | Source: Ambulatory Visit | Attending: Internal Medicine | Admitting: Internal Medicine

## 2022-06-10 DIAGNOSIS — R928 Other abnormal and inconclusive findings on diagnostic imaging of breast: Secondary | ICD-10-CM

## 2022-06-10 DIAGNOSIS — R922 Inconclusive mammogram: Secondary | ICD-10-CM | POA: Diagnosis not present

## 2022-06-16 DIAGNOSIS — N3000 Acute cystitis without hematuria: Secondary | ICD-10-CM | POA: Diagnosis not present

## 2022-06-16 DIAGNOSIS — R197 Diarrhea, unspecified: Secondary | ICD-10-CM | POA: Diagnosis not present

## 2022-06-17 LAB — C. DIFFICILE GDH AND TOXIN A/B
GDH ANTIGEN: NOT DETECTED
MICRO NUMBER:: 14774142
SPECIMEN QUALITY:: ADEQUATE
TOXIN A AND B: NOT DETECTED

## 2022-07-22 ENCOUNTER — Ambulatory Visit: Payer: Medicare Other | Admitting: Adult Health

## 2022-08-11 ENCOUNTER — Ambulatory Visit (INDEPENDENT_AMBULATORY_CARE_PROVIDER_SITE_OTHER): Payer: Medicare Other | Admitting: Nurse Practitioner

## 2022-08-11 ENCOUNTER — Encounter: Payer: Self-pay | Admitting: Nurse Practitioner

## 2022-08-11 ENCOUNTER — Ambulatory Visit: Payer: Medicare Other | Admitting: Nurse Practitioner

## 2022-08-11 VITALS — BP 110/60 | HR 62 | Temp 97.1°F | Ht 70.0 in | Wt 222.2 lb

## 2022-08-11 DIAGNOSIS — Z0001 Encounter for general adult medical examination with abnormal findings: Secondary | ICD-10-CM

## 2022-08-11 DIAGNOSIS — E559 Vitamin D deficiency, unspecified: Secondary | ICD-10-CM

## 2022-08-11 DIAGNOSIS — K2100 Gastro-esophageal reflux disease with esophagitis, without bleeding: Secondary | ICD-10-CM | POA: Diagnosis not present

## 2022-08-11 DIAGNOSIS — D509 Iron deficiency anemia, unspecified: Secondary | ICD-10-CM

## 2022-08-11 DIAGNOSIS — M5442 Lumbago with sciatica, left side: Secondary | ICD-10-CM | POA: Diagnosis not present

## 2022-08-11 DIAGNOSIS — I272 Pulmonary hypertension, unspecified: Secondary | ICD-10-CM

## 2022-08-11 DIAGNOSIS — F3342 Major depressive disorder, recurrent, in full remission: Secondary | ICD-10-CM

## 2022-08-11 DIAGNOSIS — Z0000 Encounter for general adult medical examination without abnormal findings: Secondary | ICD-10-CM

## 2022-08-11 DIAGNOSIS — M159 Polyosteoarthritis, unspecified: Secondary | ICD-10-CM

## 2022-08-11 DIAGNOSIS — K222 Esophageal obstruction: Secondary | ICD-10-CM

## 2022-08-11 DIAGNOSIS — G4737 Central sleep apnea in conditions classified elsewhere: Secondary | ICD-10-CM

## 2022-08-11 DIAGNOSIS — R6889 Other general symptoms and signs: Secondary | ICD-10-CM

## 2022-08-11 DIAGNOSIS — Z79899 Other long term (current) drug therapy: Secondary | ICD-10-CM | POA: Diagnosis not present

## 2022-08-11 DIAGNOSIS — E782 Mixed hyperlipidemia: Secondary | ICD-10-CM

## 2022-08-11 DIAGNOSIS — I4891 Unspecified atrial fibrillation: Secondary | ICD-10-CM

## 2022-08-11 DIAGNOSIS — Z86010 Personal history of colonic polyps: Secondary | ICD-10-CM

## 2022-08-11 DIAGNOSIS — R7309 Other abnormal glucose: Secondary | ICD-10-CM

## 2022-08-11 DIAGNOSIS — G8929 Other chronic pain: Secondary | ICD-10-CM

## 2022-08-11 NOTE — Progress Notes (Signed)
MEDICARE WELLNESS  Assessment and Plan:  Annual Medicare Wellness Visit Due annually  Health maintenance reviewed  A. Fib with RVR (HCC) Rate controlled on current agents; on elequis without bleeding concerns  Cardiology following  Pulmonary hypertension (HCC) Normal PFTs; abnormal sleep study; now on CPAP  Central sleep apnea associated with a. Fib (HCC) [name] following; on auto CPAP with good results; endorses 100% compliance  Morbid obesity (HCC) - BMI 30+ with sleep apnea Discussed appropriate BMI Diet modification. Physical activity. Encouraged/praised to build confidence.  Gastroesophageal reflux disease with esophagitis No suspected reflux complications (Barret/stricture). Lifestyle modification:  wt loss, avoid meals 2-3h before bedtime. Consider eliminating food triggers:  chocolate, caffeine, EtOH, acid/spicy food.  Esophageal stricture Continue GERD medications; follows with GI Discussed diet, avoiding triggers and other lifestyle changes  Primary osteoarthritis involving multiple joints Primarily knees, does fairly,tylenol, flexeril Discussed topical diclofenac/aspercreme Consider gabapentin or ortho referral for injections - declines further interventions at this time   Recurrent major depressive disorder, in full remission (HCC) Stable Lifestyle discussed: diet/exerise, sleep hygiene, stress management, hydration  Chronic low back pain with left-sided sciatica, unspecified back pain laterality Takes tylenol, flexeril PRN  History of colon polyps Repeat colonoscopy 05/2020 per GI - she declined referral, not planning to pursue further at this time Denies concerning sx  Anemia, iron deficiency Chronic recurrent for many years Stable on iron supplement; Monitor CBC, iron/ferritin  Vitamin D deficiency Continue supplement for goal of 60-100 Monitor Vitamin D levels  Cholesterol Discussed lifestyle modifications. Recommended diet heavy in  fruits and veggies, omega 3's. Decrease consumption of animal meats, cheeses, and dairy products. Remain active and exercise as tolerated. Continue to monitor. Check lipids/TSH  Medication management All medications discussed and reviewed in full. All questions and concerns regarding medications addressed.    Abnormal glucose Education: Reviewed 'ABCs' of diabetes management  Discussed goals to be met and/or maintained include A1C (<7) Blood pressure (<130/80) Cholesterol (LDL <70) Continue Eye Exam yearly  Continue Dental Exam Q6 mo Discussed dietary recommendations Discussed Physical Activity recommendations Foot exam UTD Check A1C   Orders Placed This Encounter  Procedures   CBC with Differential/Platelet   COMPLETE METABOLIC PANEL WITH GFR   Lipid panel   Hemoglobin A1c   VITAMIN D 25 Hydroxy (Vit-D Deficiency, Fractures)    Notify office for further evaluation and treatment, questions or concerns if any reported s/s fail to improve.   The patient was advised to call back or seek an in-person evaluation if any symptoms worsen or if the condition fails to improve as anticipated.   Further disposition pending results of labs. Discussed med's effects and SE's.    I discussed the assessment and treatment plan with the patient. The patient was provided an opportunity to ask questions and all were answered. The patient agreed with the plan and demonstrated an understanding of the instructions.  Discussed med's effects and SE's. Screening labs and tests as requested with regular follow-up as recommended.  I provided 35 minutes of face-to-face time during this encounter including counseling, chart review, and critical decision making was preformed.  Today's Plan of Care is based on a patient-centered health care approach known as shared decision making - the decisions, tests and treatments allow for patient preferences and values to be balanced with clinical evidence.       Future Appointments  Date Time Provider Department Center  11/05/2022  3:40 PM [name], MD [facility]  12/24/2022  3:00 PM Lucky Cowboy,  MD [facility] None  02/18/2023  2:00 PM Lomax, Amy, NP [facility] None    Plan:   During the course of the visit the patient was educated and counseled about appropriate screening and preventive services including:   Pneumococcal vaccine  Prevnar 13 Influenza vaccine Td vaccine Screening electrocardiogram Bone densitometry screening Colorectal cancer screening Diabetes screening Glaucoma screening Nutrition counseling  Advanced directives: requested    HPI  82 y.o. female  presents for a wellness and follow up for has DJD (degenerative joint disease); Chronic lower back pain; Depression; GERD (gastroesophageal reflux disease); Morbid obesity (HCC) - BMI 30+ with OSA; History of colon polyps; Abnormal glucose; Hyperlipidemia, mixed; Vitamin D deficiency; Continuous leakage of urine; Esophageal stricture; Iron deficiency anemia; Atrial fibrillation with RVR (HCC); Hypoxemia requiring supplemental oxygen; LVH (left ventricular hypertrophy) due to hypertensive disease, without heart failure; Severe obstructive sleep apnea-hypopnea syndrome; Central sleep apnea associated with atrial fibrillation (HCC); Pulmonary hypertension (HCC); Physical deconditioning; and At moderate risk for fall on their problem list.   Overall she reports feeling well today.  She has no new concerns to present at this time.   She lives with her daughter and family, they drive her when needed. Didn't want to bother getting new driver's license. She watches her 2 young grandchildren.   Uses cane for balance.  No falls in the last year. She is dependent on family for transport, would be receptive to in home PT.   She has incontinence, has tried oxybutynin and myrbetriq that did not help. Wears depends and has declined further interventions.   She has  been on paxil for depression for many years now changed to Lexapro and reports mood is stable. Previously has taken benzos, no other SSRIs.   Patient was hospitalized in Jan/Feb 2021 with new onset Afib with RVR, LVH on ECHO, EF 55-60%, now on oral diltiazem 300 mg daily, lopressor (50 mg AM, 25 mg PM), elequis 5 mg BID. Dr. Wyline Mood is following. Had low risk NM study.   She had a sleep study by [name] demonstrating central sleep apnea, now on Auto CPAP and does endorse 100% compliance.   Has chronic intermittent lower back pain, using tylenol, flexeril. Off of oral diclofenac.   BMI is Body mass index is 31.88 kg/m., she has been working on diet, eating better living with daughter.  Wt Readings from Last 3 Encounters:  08/11/22 222 lb 3.2 oz (100.8 kg)  06/02/22 215 lb 6.4 oz (97.7 kg)  05/22/22 224 lb (101.6 kg)   Today their BP is BP: 110/60  She does not workout. She denies chest pain, dizziness. Does have exertional dyspnea, ongoing for many years, felt r/t deconditioning.    She is not on cholesterol medication, reports hx of myalgias with unknown medication at one point, declines medications. Her cholesterol is not at goal. The cholesterol last visit was:   Lab Results  Component Value Date   CHOL 185 05/12/2022   HDL 64 05/12/2022   LDLCALC 103 (H) 05/12/2022   TRIG 89 05/12/2022   CHOLHDL 2.9 05/12/2022    She has not been working on diet and exercise for glucose management. Last A1C in the office was:  Lab Results  Component Value Date   HGBA1C 5.8 (H) 05/12/2022    Last GFR:  Lab Results  Component Value Date   GFRNONAA >60 08/07/2020   Patient is on Vitamin D supplement.   Lab Results  Component Value Date   VD25OH 84 05/12/2022  She has long hx of iron def anemia, Had colonoscopy with polyps 05/2017, EGD 3.2019 by Dr. Russella Dar, no bleeding or ulcers. currently normal levels and has backed off on chronic iron supplement to every other day, has been on for  several years due to recurrence.      Latest Ref Rng & Units 05/12/2022   10:18 AM 02/03/2022   12:00 AM 10/29/2021   10:47 AM  CBC  WBC 3.8 - 10.8 Thousand/uL 5.9  6.0  6.3   Hemoglobin 11.7 - 15.5 g/dL 16.1  09.6  04.5   Hematocrit 35.0 - 45.0 % 39.4  39.3  41.1   Platelets 140 - 400 Thousand/uL 193  202  216    She takes slow release iron 65 mg every other day Lab Results  Component Value Date   IRON 169 (H) 04/23/2021   TIBC 320 04/23/2021   FERRITIN 62 04/23/2021     Current Medications:   Current Outpatient Medications (Endocrine & Metabolic):    predniSONE (DELTASONE) 10 MG tablet, 1 tab 3 x day for 2 days, then 1 tab 2 x day for 2 days, then 1 tab 1 x day for 3 days  Current Outpatient Medications (Cardiovascular):    diltiazem (CARDIZEM CD) 300 MG 24 hr capsule, Take 1 capsule (300 mg total) by mouth daily.   metoprolol tartrate (LOPRESSOR) 25 MG tablet, TAKE 2 TABLETS BY MOUTH IN THE  MORNING AND 1 TABLET BY MOUTH IN THE EVENING   nitroGLYCERIN (NITROSTAT) 0.4 MG SL tablet, Place 1 tablet (0.4 mg total) under the tongue every 5 (five) minutes as needed for chest pain.  Current Outpatient Medications (Respiratory):    albuterol (VENTOLIN HFA) 108 (90 Base) MCG/ACT inhaler, Inhale 2 puffs into the lungs every 6 (six) hours as needed for wheezing or shortness of breath.   benzonatate (TESSALON PERLES) 100 MG capsule, Take 1 capsule (100 mg total) by mouth every 6 (six) hours as needed for cough.   ipratropium-albuterol (DUONEB) 0.5-2.5 (3) MG/3ML SOLN, USE 1 VIAL IN NEBULIZER EVERY 4 HOURS  Current Outpatient Medications (Analgesics):    acetaminophen (TYLENOL) 500 MG tablet, Take 500 mg by mouth every 6 (six) hours as needed.  Current Outpatient Medications (Hematological):    apixaban (ELIQUIS) 5 MG TABS tablet, Take 1 tablet (5 mg total) by mouth 2 (two) times daily.   Cyanocobalamin 2500 MCG TABS, Take 2,500 mcg by mouth daily.   ferrous sulfate 325 (65 FE) MG  tablet, Take 1 tablet (325 mg total) by mouth every other day.  Current Outpatient Medications (Other):    Ascorbic Acid (VITAMIN C) 500 MG CAPS, Take 500 mg by mouth.    busPIRone (BUSPAR) 10 MG tablet, TAKE 1/2 TO 1 TABLET BY MOUTH 3  TIMES DAILY AS NEEDED FOR  ANXIETY, MOOD OR IRRITABILITY   CHOLECALCIFEROL PO, Take 5,000 Units by mouth daily.   cyclobenzaprine (FLEXERIL) 10 MG tablet, Take 1/2 to 1 tablet    2 to 3 x /day if needed for Muscle Spasms   escitalopram (LEXAPRO) 20 MG tablet, Take 1 tablet Daily for Mood, Anxiety & Irritability   lansoprazole (PREVACID) 30 MG capsule, TAKE 1 CAPSULE BY MOUTH  DAILY FOR HEARTBURN AND  INDIGESTION   Magnesium 250 MG TABS, Take by mouth daily.   oxybutynin (DITROPAN-XL) 10 MG 24 hr tablet, Take 1 tablet  Daily for Bladder Control   Probiotic Product (PROBIOTIC DAILY PO), Take 1 tablet by mouth daily.   psyllium (METAMUCIL) 58.6 %  packet, Take 1 packet by mouth daily.   zinc gluconate 50 MG tablet, Take 50 mg by mouth daily.  Allergies:  Allergies  Allergen Reactions   Cephalosporins Rash     Medical History:  She has DJD (degenerative joint disease); Chronic lower back pain; Depression; GERD (gastroesophageal reflux disease); Morbid obesity (HCC) - BMI 30+ with OSA; History of colon polyps; Abnormal glucose; Hyperlipidemia, mixed; Vitamin D deficiency; Continuous leakage of urine; Esophageal stricture; Iron deficiency anemia; Atrial fibrillation with RVR (HCC); Hypoxemia requiring supplemental oxygen; LVH (left ventricular hypertrophy) due to hypertensive disease, without heart failure; Severe obstructive sleep apnea-hypopnea syndrome; Central sleep apnea associated with atrial fibrillation (HCC); Pulmonary hypertension (HCC); Physical deconditioning; and At moderate risk for fall on their problem list.   Health Maintenance:   Immunization History  Administered Date(s) Administered   Fluad Quad(high Dose 65+) 12/04/2019, 12/21/2020    Influenza, High Dose Seasonal PF 01/29/2018, 02/03/2022   Influenza-Unspecified 12/09/2016, 11/28/2018   Moderna Sars-Covid-2 Vaccination 03/28/2019, 04/28/2019, 02/27/2020   Pneumococcal Conjugate-13 02/12/2016, 09/30/2017   Pneumococcal Polysaccharide-23 03/16/2006, 04/02/2008   Zoster, Live 07/31/2013   Health Maintenance  Topic Date Due   DTaP/Tdap/Td (1 - Tdap) Never done   Zoster Vaccines- Shingrix (1 of 2) Never done   DEXA SCAN  Never done   COVID-19 Vaccine (4 - 2023-24 season) 11/14/2021   INFLUENZA VACCINE  10/15/2022   Medicare Annual Wellness (AWV)  08/11/2023   MAMMOGRAM  05/26/2024   Pneumonia Vaccine 63+ Years old  Completed   HPV VACCINES  Aged Out   Colonoscopy  Discontinued   Tetanus: Declines, cost  Pap: remote MGM: gets annually at [facility] center 05/2022 DEXA: remote, normal, declines further; discussed risk of falls and fractures; she expresses understanding Colonoscopy: 05/2017, polyps  - due 05/2020, but she decided not to pursue further  CXR 03/2019 - small hiatal hernia   Last Dental Exam: Dr. Langston Masker, last visit 2023, goes q33m Last Eye Exam: [facility], 2023, mild cataracts  Patient Care Team: Lucky Cowboy, MD as PCP - General (Internal Medicine) Wyline Mood Dorothe Pea, MD as PCP - Cardiology (Cardiology)  Surgical History:  She has a past surgical history that includes Abdominal hysterectomy (03/1979); Cataract extraction, bilateral (Bilateral, 2014); and Breast biopsy (Left, 1986 and 1987). Family History:  Herfamily history includes Dementia in her brother; Heart disease in her father and mother; Hypertension in her daughter, father, and mother; Tremor in her sister. Social History:  She reports that she has never smoked. She has never used smokeless tobacco. She reports current alcohol use of about 3.0 standard drinks of alcohol per week. She reports that she does not use drugs.  MEDICARE WELLNESS OBJECTIVES: Physical activity:   Cardiac  risk factors:   Depression/mood screen:      08/11/2022    3:10 PM  Depression screen PHQ 2/9  Decreased Interest 0  Down, Depressed, Hopeless 0  PHQ - 2 Score 0    ADLs:     08/11/2022    3:10 PM  In your present state of health, do you have any difficulty performing the following activities:  Hearing? 0  Vision? 0  Difficulty concentrating or making decisions? 0  Walking or climbing stairs? 0  Comment Uses cane  Dressing or bathing? 0  Doing errands, shopping? 0     Cognitive Testing  Alert? Yes  Normal Appearance?Yes  Oriented to person? Yes  Place? Yes   Time? Yes  Recall of three objects?  Yes  Can  perform simple calculations? Yes  Displays appropriate judgment?Yes  Can read the correct time from a watch face?Yes  EOL planning:      Review of Systems  Constitutional:  Negative for malaise/fatigue and weight loss.  HENT:  Negative for hearing loss and tinnitus.   Eyes:  Negative for blurred vision and double vision.  Respiratory:  Negative for cough, shortness of breath and wheezing.   Cardiovascular:  Negative for chest pain, palpitations, orthopnea, claudication and leg swelling.  Gastrointestinal:  Negative for abdominal pain, blood in stool, constipation, diarrhea, heartburn, melena, nausea and vomiting.  Genitourinary: Negative.   Musculoskeletal:  Positive for back pain (chronic intermittent lumbar) and falls (2 in last year, none in last 3 months, no injury, frequent near falls per patient). Negative for joint pain and myalgias.  Skin:  Negative for rash.  Neurological:  Negative for dizziness, tingling, sensory change, weakness and headaches.  Endo/Heme/Allergies:  Negative for polydipsia.  Psychiatric/Behavioral: Negative.    All other systems reviewed and are negative.    Physical Exam: Estimated body mass index is 31.88 kg/m as calculated from the following:   Height as of this encounter: 5\' 10"  (1.778 m).   Weight as of this encounter: 222 lb  3.2 oz (100.8 kg). BP 110/60   Pulse 62   Temp (!) 97.1 F (36.2 C)   Ht 5\' 10"  (1.778 m)   Wt 222 lb 3.2 oz (100.8 kg)   SpO2 93%   BMI 31.88 kg/m  General Appearance: Well nourished, in no apparent distress.  Eyes: PERRLA, EOMs, conjunctiva no swelling or erythema, normal fundi and vessels.  Sinuses: No Frontal/maxillary tenderness  ENT/Mouth: Ext aud canals clear, normal light reflex with TMs without erythema, bulging. Mask in place; oral exam deferred. Hearing normal.  Neck: Supple, thyroid normal. No bruits  Respiratory: Respiratory effort normal, BS equal bilaterally without rales, rhonchi, wheezing or stridor.  Cardio: Irregularly irregular. Brisk peripheral pulses without edema.  Chest: symmetric, with normal excursions and percussion.  Breasts: Symmetric, without lumps, nipple discharge, retractions.  Abdomen: Soft, nontender, no guarding, rebound, hernias, masses, or organomegaly.  Lymphatics: Non tender without lymphadenopathy.  Genitourinary: Defer Musculoskeletal: Full ROM all peripheral extremities, 4/5 strength, and slow steady gait and sitting to standing. Mild kyphosis.  Skin: Warm, dry without rashes, lesions, ecchymosis. Neuro: Cranial nerves intact, reflexes equal bilaterally. Normal muscle tone, no cerebellar symptoms. Sensation intact.  Psych: Awake and oriented X 3, normal affect, Insight and Judgment appropriate.    Medicare Attestation I have personally reviewed: The patient's medical and social history Their use of alcohol, tobacco or illicit drugs Their current medications and supplements The patient's functional ability including ADLs,fall risks, home safety risks, cognitive, and hearing and visual impairment Diet and physical activities Evidence for depression or mood disorders  The patient's weight, height, BMI, and visual acuity have been recorded in the chart.  I have made referrals, counseling, and provided education to the patient based on review  of the above and I have provided the patient with a written personalized care plan for preventive services.     Darlene [name] 3:23 PM [facility]

## 2022-08-11 NOTE — Patient Instructions (Signed)

## 2022-08-12 LAB — CBC WITH DIFFERENTIAL/PLATELET
Absolute Monocytes: 553 cells/uL (ref 200–950)
Basophils Absolute: 52 cells/uL (ref 0–200)
Basophils Relative: 0.8 %
Eosinophils Absolute: 78 cells/uL (ref 15–500)
Eosinophils Relative: 1.2 %
HCT: 40.7 % (ref 35.0–45.0)
Hemoglobin: 13.8 g/dL (ref 11.7–15.5)
Lymphs Abs: 1911 cells/uL (ref 850–3900)
MCH: 32.1 pg (ref 27.0–33.0)
MCHC: 33.9 g/dL (ref 32.0–36.0)
MCV: 94.7 fL (ref 80.0–100.0)
MPV: 11.5 fL (ref 7.5–12.5)
Monocytes Relative: 8.5 %
Neutro Abs: 3907 cells/uL (ref 1500–7800)
Neutrophils Relative %: 60.1 %
Platelets: 197 10*3/uL (ref 140–400)
RBC: 4.3 10*6/uL (ref 3.80–5.10)
RDW: 13 % (ref 11.0–15.0)
Total Lymphocyte: 29.4 %
WBC: 6.5 10*3/uL (ref 3.8–10.8)

## 2022-08-12 LAB — COMPLETE METABOLIC PANEL WITH GFR
AG Ratio: 1.9 (calc) (ref 1.0–2.5)
ALT: 9 U/L (ref 6–29)
AST: 14 U/L (ref 10–35)
Albumin: 4 g/dL (ref 3.6–5.1)
Alkaline phosphatase (APISO): 63 U/L (ref 37–153)
BUN: 20 mg/dL (ref 7–25)
CO2: 28 mmol/L (ref 20–32)
Calcium: 9.7 mg/dL (ref 8.6–10.4)
Chloride: 105 mmol/L (ref 98–110)
Creat: 0.87 mg/dL (ref 0.60–0.95)
Globulin: 2.1 g/dL (calc) (ref 1.9–3.7)
Glucose, Bld: 130 mg/dL — ABNORMAL HIGH (ref 65–99)
Potassium: 4.2 mmol/L (ref 3.5–5.3)
Sodium: 143 mmol/L (ref 135–146)
Total Bilirubin: 0.6 mg/dL (ref 0.2–1.2)
Total Protein: 6.1 g/dL (ref 6.1–8.1)
eGFR: 66 mL/min/{1.73_m2} (ref >=60)

## 2022-08-12 LAB — HEMOGLOBIN A1C
Hgb A1c MFr Bld: 5.7 % of total Hgb — ABNORMAL HIGH (ref <5.7)
Mean Plasma Glucose: 117 mg/dL
eAG (mmol/L): 6.5 mmol/L

## 2022-08-12 LAB — VITAMIN D 25 HYDROXY (VIT D DEFICIENCY, FRACTURES): Vit D, 25-Hydroxy: 78 ng/mL (ref 30–100)

## 2022-08-12 LAB — LIPID PANEL
Cholesterol: 208 mg/dL — ABNORMAL HIGH (ref <200)
HDL: 71 mg/dL (ref >=50)
LDL Cholesterol (Calc): 114 mg/dL (calc) — ABNORMAL HIGH
Non-HDL Cholesterol (Calc): 137 mg/dL (calc) — ABNORMAL HIGH (ref <130)
Total CHOL/HDL Ratio: 2.9 (calc) (ref <5.0)
Triglycerides: 118 mg/dL (ref <150)

## 2022-08-13 ENCOUNTER — Ambulatory Visit: Payer: Medicare Other | Admitting: Nurse Practitioner

## 2022-08-15 ENCOUNTER — Other Ambulatory Visit: Payer: Self-pay | Admitting: Internal Medicine

## 2022-08-15 DIAGNOSIS — K2100 Gastro-esophageal reflux disease with esophagitis, without bleeding: Secondary | ICD-10-CM

## 2022-08-17 ENCOUNTER — Other Ambulatory Visit: Payer: Self-pay | Admitting: Cardiology

## 2022-08-17 NOTE — Telephone Encounter (Signed)
Prescription refill request for Eliquis received. Indication: AF Last office visit: 09/29/21  Dominga Ferry MD Scr: 0.87 on 08/11/22  Epic Age: 82 Weight: 105.2kg  Based on above findings Eliquis 5mg  twice daily is the appropriate dose.  Refill approved.

## 2022-08-31 ENCOUNTER — Encounter (HOSPITAL_COMMUNITY): Payer: Self-pay | Admitting: Emergency Medicine

## 2022-08-31 ENCOUNTER — Emergency Department (HOSPITAL_COMMUNITY): Payer: Medicare Other

## 2022-08-31 ENCOUNTER — Other Ambulatory Visit: Payer: Self-pay

## 2022-08-31 ENCOUNTER — Emergency Department (HOSPITAL_COMMUNITY)
Admission: EM | Admit: 2022-08-31 | Discharge: 2022-08-31 | Disposition: A | Discharge status: Home / Self Care | Payer: Medicare Other | Attending: Emergency Medicine | Admitting: Emergency Medicine

## 2022-08-31 DIAGNOSIS — R519 Headache, unspecified: Secondary | ICD-10-CM | POA: Diagnosis present

## 2022-08-31 DIAGNOSIS — Z7901 Long term (current) use of anticoagulants: Secondary | ICD-10-CM | POA: Diagnosis not present

## 2022-08-31 DIAGNOSIS — W01198A Fall on same level from slipping, tripping and stumbling with subsequent striking against other object, initial encounter: Secondary | ICD-10-CM | POA: Insufficient documentation

## 2022-08-31 DIAGNOSIS — T1490XA Injury, unspecified, initial encounter: Secondary | ICD-10-CM | POA: Diagnosis not present

## 2022-08-31 DIAGNOSIS — I4891 Unspecified atrial fibrillation: Secondary | ICD-10-CM | POA: Diagnosis not present

## 2022-08-31 DIAGNOSIS — I10 Essential (primary) hypertension: Secondary | ICD-10-CM | POA: Diagnosis not present

## 2022-08-31 DIAGNOSIS — Z23 Encounter for immunization: Secondary | ICD-10-CM | POA: Insufficient documentation

## 2022-08-31 DIAGNOSIS — W19XXXA Unspecified fall, initial encounter: Secondary | ICD-10-CM

## 2022-08-31 DIAGNOSIS — S80212A Abrasion, left knee, initial encounter: Secondary | ICD-10-CM | POA: Insufficient documentation

## 2022-08-31 DIAGNOSIS — S0003XA Contusion of scalp, initial encounter: Secondary | ICD-10-CM | POA: Diagnosis not present

## 2022-08-31 DIAGNOSIS — R079 Chest pain, unspecified: Secondary | ICD-10-CM | POA: Diagnosis not present

## 2022-08-31 DIAGNOSIS — S50312A Abrasion of left elbow, initial encounter: Secondary | ICD-10-CM | POA: Diagnosis not present

## 2022-08-31 DIAGNOSIS — S80912A Unspecified superficial injury of left knee, initial encounter: Secondary | ICD-10-CM | POA: Diagnosis not present

## 2022-08-31 DIAGNOSIS — M25512 Pain in left shoulder: Secondary | ICD-10-CM | POA: Diagnosis not present

## 2022-08-31 DIAGNOSIS — M79672 Pain in left foot: Secondary | ICD-10-CM | POA: Diagnosis not present

## 2022-08-31 DIAGNOSIS — S0990XA Unspecified injury of head, initial encounter: Secondary | ICD-10-CM | POA: Diagnosis not present

## 2022-08-31 DIAGNOSIS — M25522 Pain in left elbow: Secondary | ICD-10-CM | POA: Diagnosis not present

## 2022-08-31 DIAGNOSIS — M25562 Pain in left knee: Secondary | ICD-10-CM | POA: Diagnosis not present

## 2022-08-31 LAB — CBC
HCT: 40 % (ref 36.0–46.0)
Hemoglobin: 13.5 g/dL (ref 12.0–15.0)
MCH: 32.7 pg (ref 26.0–34.0)
MCHC: 33.8 g/dL (ref 30.0–36.0)
MCV: 96.9 fL (ref 80.0–100.0)
Platelets: 190 10*3/uL (ref 150–400)
RBC: 4.13 MIL/uL (ref 3.87–5.11)
RDW: 13.3 % (ref 11.5–15.5)
WBC: 8 10*3/uL (ref 4.0–10.5)
nRBC: 0 % (ref 0.0–0.2)

## 2022-08-31 LAB — COMPREHENSIVE METABOLIC PANEL
ALT: 14 U/L (ref 0–44)
AST: 19 U/L (ref 15–41)
Albumin: 3.7 g/dL (ref 3.5–5.0)
Alkaline Phosphatase: 55 U/L (ref 38–126)
Anion gap: 9 (ref 5–15)
BUN: 15 mg/dL (ref 8–23)
CO2: 27 mmol/L (ref 22–32)
Calcium: 9.3 mg/dL (ref 8.9–10.3)
Chloride: 103 mmol/L (ref 98–111)
Creatinine, Ser: 0.73 mg/dL (ref 0.44–1.00)
GFR, Estimated: >60 mL/min (ref >60)
Glucose, Bld: 108 mg/dL — ABNORMAL HIGH (ref 70–99)
Potassium: 3.8 mmol/L (ref 3.5–5.1)
Sodium: 139 mmol/L (ref 135–145)
Total Bilirubin: 1.2 mg/dL (ref 0.3–1.2)
Total Protein: 6 g/dL — ABNORMAL LOW (ref 6.5–8.1)

## 2022-08-31 MED ORDER — ACETAMINOPHEN 325 MG PO TABS
650.0000 mg | ORAL_TABLET | Freq: Once | ORAL | Status: AC
  Administered 2022-08-31: 650 mg via ORAL
  Filled 2022-08-31: qty 2

## 2022-08-31 MED ORDER — TETANUS-DIPHTH-ACELL PERTUSSIS 5-2.5-18.5 LF-MCG/0.5 IM SUSY
0.5000 mL | PREFILLED_SYRINGE | Freq: Once | INTRAMUSCULAR | Status: AC
  Administered 2022-08-31: 0.5 mL via INTRAMUSCULAR
  Filled 2022-08-31: qty 0.5

## 2022-08-31 NOTE — ED Notes (Signed)
Pt transported to CT via stretcher.  

## 2022-08-31 NOTE — ED Notes (Signed)
AVS provided to and discussed with patient and family member at bedside. Pt verbalizes understanding of discharge instructions and denies any questions or concerns at this time. Pt has ride home. Pt taken out of department via W/C.  ? ?

## 2022-08-31 NOTE — ED Provider Notes (Signed)
[place] EMERGENCY DEPARTMENT AT [facility] Provider Note   CSN: [id number] Arrival date & time: 08/31/22  0913     History  Chief Complaint  Patient presents with   Darlene [name] is a 82 y.o. female.   Fall Associated symptoms include headaches.  Patient presents after a fall.  Medical history includes chronic pain, depression, GERD, HLD, anemia, atrial fibrillation, sleep apnea, anxiety.  Patient describes a mechanical fall that occurred shortly prior to arrival.  She was walking out to a vehicle parked outside, she tripped.  As she tripped, she fell to the left.  She struck the left side of her head on the bumper of the vehicle.  She landed in gravel and suffered some abrasions to her left elbow and knee.  She currently endorses pain in left foot, left knee, and left side of head.  She is on Eliquis and did take her dose this morning.     Home Medications Prior to Admission medications   Medication Sig Start Date End Date Taking? Authorizing Provider  acetaminophen (TYLENOL) 500 MG tablet Take 500 mg by mouth every 6 (six) hours as needed.   Yes [provider]  albuterol (VENTOLIN HFA) 108 (90 Base) MCG/ACT inhaler Inhale 2 puffs into the lungs every 6 (six) hours as needed for wheezing or shortness of breath. 05/22/22  Yes Cranford, Archie Patten, NP  apixaban (ELIQUIS) 5 MG TABS tablet TAKE 1 TABLET BY MOUTH TWICE  DAILY 08/17/22  Yes Branch, Dorothe Pea, MD  Ascorbic Acid (VITAMIN C) 500 MG CAPS Take 500 mg by mouth.    Yes [provider]  benzonatate (TESSALON PERLES) 100 MG capsule Take 1 capsule (100 mg total) by mouth every 6 (six) hours as needed for cough. 02/03/22 02/03/23 Yes Raynelle Dick, NP  busPIRone (BUSPAR) 10 MG tablet TAKE 1/2 TO 1 TABLET BY MOUTH 3  TIMES DAILY AS NEEDED FOR  ANXIETY, MOOD OR IRRITABILITY Patient taking differently: Take 10 mg by mouth 3 (three) times daily as needed (ANXIETY, MOOD OR IRRITABILITY). 04/07/22  Yes  Raynelle Dick, NP  CHOLECALCIFEROL PO Take 5,000 Units by mouth daily.   Yes [provider]  Cyanocobalamin 2500 MCG TABS Take 2,500 mcg by mouth daily.   Yes [provider]  cyclobenzaprine (FLEXERIL) 10 MG tablet Take 1/2 to 1 tablet    2 to 3 x /day if needed for Muscle Spasms Patient taking differently: Take 5-10 mg by mouth 3 (three) times daily as needed for muscle spasms. Take 1/2 to 1 tablet    2 to 3 x /day if needed for Muscle Spasms 04/25/19  Yes Lucky Cowboy, MD  diltiazem (CARDIZEM CD) 300 MG 24 hr capsule Take 1 capsule (300 mg total) by mouth daily. 12/31/21  Yes Branch, Dorothe Pea, MD  escitalopram (LEXAPRO) 20 MG tablet Take 1 tablet Daily for Mood, Anxiety & Irritability Patient taking differently: Take 20 mg by mouth daily. Take 1 tablet Daily for Mood, Anxiety & Irritability 12/30/21  Yes Lucky Cowboy, MD  ferrous sulfate 325 (65 FE) MG tablet Take 1 tablet (325 mg total) by mouth every other day. 01/15/20  Yes [name], NP  ipratropium-albuterol (DUONEB) 0.5-2.5 (3) MG/3ML SOLN USE 1 VIAL IN NEBULIZER EVERY 4 HOURS Patient taking differently: Take 3 mLs by nebulization every 4 (four) hours as needed (SOB/Wheezing). 04/02/22  Yes Raynelle Dick, NP  lansoprazole (PREVACID) 30 MG capsule Take  1 capsule Daily to  Prevent Heartburn & Indigestion                                               /                                                       TAKE                                              BY                                                         MOUTH 08/16/22  Yes Lucky Cowboy, MD  Magnesium 250 MG TABS Take 1 tablet by mouth daily.   Yes [provider]  metoprolol tartrate (LOPRESSOR) 25 MG tablet TAKE 2 TABLETS BY MOUTH IN THE  MORNING AND 1 TABLET BY MOUTH IN THE EVENING Patient taking differently: Take 25-50 mg by mouth 2 (two) times daily. TAKE 2 TABLETS BY MOUTH IN THE  MORNING AND 1 TABLET BY MOUTH IN THE EVENING 04/20/22   Yes Branch, Dorothe Pea, MD  Probiotic Product (PROBIOTIC DAILY PO) Take 1 tablet by mouth daily.   Yes [provider]  psyllium (METAMUCIL) 58.6 % packet Take 1 packet by mouth daily.   Yes [provider]  zinc gluconate 50 MG tablet Take 50 mg by mouth daily.   Yes [provider]  nitroGLYCERIN (NITROSTAT) 0.4 MG SL tablet Place 1 tablet (0.4 mg total) under the tongue every 5 (five) minutes as needed for chest pain. 02/05/20   Strader, Lennart Pall, PA-C  oxybutynin (DITROPAN-XL) 10 MG 24 hr tablet Take 1 tablet  Daily for Bladder Control Patient not taking: Reported on 08/31/2022 12/30/21   Lucky Cowboy, MD      Allergies    Cephalosporins    Review of Systems   Review of Systems  Musculoskeletal:  Positive for arthralgias.  Neurological:  Positive for headaches.  All other systems reviewed and are negative.   Physical Exam Updated Vital Signs BP 126/75   Pulse (!) 56   Temp 97.9 F (36.6 C) (Oral)   Resp (!) 25   SpO2 94%  Physical Exam Vitals and nursing note reviewed.  Constitutional:      General: She is not in acute distress.    Appearance: Normal appearance. She is well-developed. She is not ill-appearing, toxic-appearing or diaphoretic.  HENT:     Head: Normocephalic.     Comments: Small hematoma to left side of scalp    Right Ear: External ear normal.     Left Ear: External ear normal.     Nose: Nose normal.     Mouth/Throat:     Mouth: Mucous membranes are moist.  Eyes:     Extraocular Movements: Extraocular movements intact.     Conjunctiva/sclera: Conjunctivae normal.  Cardiovascular:     Rate  and Rhythm: Normal rate. Rhythm irregular.     Heart sounds: No murmur heard. Pulmonary:     Effort: Pulmonary effort is normal. No respiratory distress.     Breath sounds: No wheezing or rales.  Chest:     Chest wall: No tenderness.  Abdominal:     General: There is no distension.     Palpations: Abdomen is soft.     Tenderness:  There is no abdominal tenderness.  Musculoskeletal:        General: No swelling or deformity. Normal range of motion.     Cervical back: Normal range of motion and neck supple.     Right lower leg: No edema.     Left lower leg: No edema.  Skin:    General: Skin is warm and dry.     Coloration: Skin is not jaundiced or pale.     Comments: Abrasions to left knee and left elbow  Neurological:     General: No focal deficit present.     Mental Status: She is alert and oriented to person, place, and time.  Psychiatric:        Mood and Affect: Mood normal.        Behavior: Behavior normal.        Thought Content: Thought content normal.        Judgment: Judgment normal.     ED Results / Procedures / Treatments   Labs (all labs ordered are listed, but only abnormal results are displayed) Labs Reviewed  COMPREHENSIVE METABOLIC PANEL - Abnormal; Notable for the following components:      Result Value   Glucose, Bld 108 (*)    Total Protein 6.0 (*)    All other components within normal limits  CBC    EKG EKG Interpretation  Date/Time:  Monday August 31 2022 10:12:56 EDT Ventricular Rate:  55 PR Interval:    QRS Duration: 120 QT Interval:  469 QTC Calculation: 453 R Axis:   53 Text Interpretation: Atrial flutter Nonspecific intraventricular conduction delay Minimal ST depression, inferior leads Confirmed by Gloris Manchester [phone]) on 08/31/2022 11:49:51 AM  Radiology CT Head Wo Contrast  Result Date: 08/31/2022 CLINICAL DATA:  Head trauma, coagulopathy (Age 57-64y); Polytrauma, blunt EXAM: CT HEAD WITHOUT CONTRAST CT CERVICAL SPINE WITHOUT CONTRAST TECHNIQUE: Multidetector CT imaging of the head and cervical spine was performed following the standard protocol without intravenous contrast. Multiplanar CT image reconstructions of the cervical spine were also generated. RADIATION DOSE REDUCTION: This exam was performed according to the departmental dose-optimization program which includes  automated exposure control, adjustment of the mA and/or kV according to patient size and/or use of iterative reconstruction technique. COMPARISON:  None Available. FINDINGS: CT HEAD FINDINGS Brain: No evidence of acute infarction, hemorrhage, hydrocephalus, extra-axial collection or mass lesion/mass effect. Vascular: No hyperdense vessel or unexpected calcification. Skull: Normal. Negative for fracture or focal lesion. Sinuses/Orbits: No middle ear or mastoid effusion. Paranasal sinuses are clear. Bilateral lens replacement. Orbits are otherwise unremarkable. Other: None. CT CERVICAL SPINE FINDINGS Alignment: Straightening of the normal cervical lordosis. Skull base and vertebrae: No acute fracture. No primary bone lesion or focal pathologic process. Soft tissues and spinal canal: No prevertebral fluid or swelling. No visible canal hematoma. Disc levels:  No evidence of high-grade spinal canal stenosis Upper chest: Negative. Other: Possible low-density thyroid nodule in the thyroid lobe 2 cm. Recommend further with a dedicated thyroid ultrasound, previously performed. IMPRESSION: 1. No acute intracranial abnormality. 2. No acute fracture or traumatic  subluxation of the cervical spine. 3. Possible low-density thyroid nodule in the left thyroid lobe measuring 2 cm. Recommend further evaluation with a dedicated thyroid ultrasound, if not previously performed. Electronically Signed   By: Lorenza Cambridge M.D.   On: 08/31/2022 11:24   CT CERVICAL SPINE WO CONTRAST  Result Date: 08/31/2022 CLINICAL DATA:  Head trauma, coagulopathy (Age 58-64y); Polytrauma, blunt EXAM: CT HEAD WITHOUT CONTRAST CT CERVICAL SPINE WITHOUT CONTRAST TECHNIQUE: Multidetector CT imaging of the head and cervical spine was performed following the standard protocol without intravenous contrast. Multiplanar CT image reconstructions of the cervical spine were also generated. RADIATION DOSE REDUCTION: This exam was performed according to the  departmental dose-optimization program which includes automated exposure control, adjustment of the mA and/or kV according to patient size and/or use of iterative reconstruction technique. COMPARISON:  None Available. FINDINGS: CT HEAD FINDINGS Brain: No evidence of acute infarction, hemorrhage, hydrocephalus, extra-axial collection or mass lesion/mass effect. Vascular: No hyperdense vessel or unexpected calcification. Skull: Normal. Negative for fracture or focal lesion. Sinuses/Orbits: No middle ear or mastoid effusion. Paranasal sinuses are clear. Bilateral lens replacement. Orbits are otherwise unremarkable. Other: None. CT CERVICAL SPINE FINDINGS Alignment: Straightening of the normal cervical lordosis. Skull base and vertebrae: No acute fracture. No primary bone lesion or focal pathologic process. Soft tissues and spinal canal: No prevertebral fluid or swelling. No visible canal hematoma. Disc levels:  No evidence of high-grade spinal canal stenosis Upper chest: Negative. Other: Possible low-density thyroid nodule in the thyroid lobe 2 cm. Recommend further with a dedicated thyroid ultrasound, previously performed. IMPRESSION: 1. No acute intracranial abnormality. 2. No acute fracture or traumatic subluxation of the cervical spine. 3. Possible low-density thyroid nodule in the left thyroid lobe measuring 2 cm. Recommend further evaluation with a dedicated thyroid ultrasound, if not previously performed. Electronically Signed   By: Lorenza Cambridge M.D.   On: 08/31/2022 11:24   DG Elbow 2 Views Left  Result Date: 08/31/2022 CLINICAL DATA:  Pain after fall EXAM: LEFT ELBOW - 2 VIEW COMPARISON:  None Available. FINDINGS: There is no evidence of fracture, dislocation, or joint effusion. There is no evidence of arthropathy or other focal bone abnormality. Soft tissues are unremarkable. IMPRESSION: No acute osseous abnormality Electronically Signed   By: Karen Kays M.D.   On: 08/31/2022 11:06   DG Foot 2 Views  Left  Result Date: 08/31/2022 CLINICAL DATA:  Pain after fall EXAM: LEFT FOOT - 2 VIEW COMPARISON:  None Available. FINDINGS: No fracture or dislocation.  Preserved joint spaces.  Osteopenia. IMPRESSION: No acute osseous abnormality. Electronically Signed   By: Karen Kays M.D.   On: 08/31/2022 11:05   DG Knee 2 Views Left  Result Date: 08/31/2022 CLINICAL DATA:  Pain after fall EXAM: LEFT KNEE - 2 VIEW COMPARISON:  None Available. FINDINGS: No acute fracture or dislocation. Preserved joint spaces and no significant joint effusion. Osteopenia. There is an ill-defined areas sclerosis along the fibular head of uncertain etiology and significance. This is seen predominantly on the AP view rather than the lateral due to overlapping structures. Overall favor a benign process but please correlate with any prior or dedicated four view knee x-ray to further delineate when appropriate IMPRESSION: Osteopenia. Ill-defined sclerotic area along the fibular head of uncertain etiology and significance. Favor a benign process. Please correlate however with any prior, location of pain and if needed further workup with a additional imaging when clinically appropriate Electronically Signed   By: Piedad Climes.D.  On: 08/31/2022 11:05   DG Pelvis Portable  Result Date: 08/31/2022 CLINICAL DATA:  Pain after fall EXAM: PORTABLE PELVIS 1 VIEWS COMPARISON:  None Available. FINDINGS: No acute fracture or dislocation. Osteopenia. Small osteophytes seen along the sacroiliac joints. Degenerative changes seen of the lumbar spine at the edge of the imaging field. With this level of osteopenia subtle nondisplaced injury is difficult to completely exclude and if needed follow-up cross-sectional imaging as clinically appropriate IMPRESSION: Osteopenia.  No acute osseous abnormality.  Mild degenerative change Electronically Signed   By: Karen Kays M.D.   On: 08/31/2022 11:03   DG Chest Port 1 View  Result Date: 08/31/2022 CLINICAL  DATA:  Pain after fall EXAM: PORTABLE CHEST 1 VIEW COMPARISON:  X-ray 06/02/2022 FINDINGS: Hyperinflation. Stable cardiopericardial silhouette. Apical pleural thickening. No pneumothorax, effusion or edema. Chronic lung changes identified. Moderate to large hiatal hernia. Overlapping cardiac leads. IMPRESSION: Hyperinflation with chronic lung changes.  Hiatal hernia. Electronically Signed   By: Karen Kays M.D.   On: 08/31/2022 11:02    Procedures Procedures    Medications Ordered in ED Medications  acetaminophen (TYLENOL) tablet 650 mg (650 mg Oral Given 08/31/22 1023)  Tdap (BOOSTRIX) injection 0.5 mL (0.5 mLs Intramuscular Given 08/31/22 1022)    ED Course/ Medical Decision Making/ A&P                             Medical Decision Making Amount and/or Complexity of Data Reviewed Labs: ordered. Radiology: ordered. ECG/medicine tests: ordered.  Risk OTC drugs. Prescription drug management.   This patient presents to the ED for concern of fall, this involves an extensive number of treatment options, and is a complaint that carries with it a high risk of complications and morbidity.  The differential diagnosis includes acute injuries   Co morbidities that complicate the patient evaluation  chronic pain, depression, GERD, HLD, anemia, atrial fibrillation, sleep apnea, anxiety   Additional history obtained:  Additional history obtained from N/A External records from outside source obtained and reviewed including EMR   Lab Tests:  I Ordered, and personally interpreted labs.  The pertinent results include: Normal hemoglobin, no leukocytosis, normal electrolytes, normal kidney function   Imaging Studies ordered:  I ordered imaging studies including x-ray of chest, pelvis, left knee, left foot, left elbow; CT imaging of head and cervical spine I independently visualized and interpreted imaging which showed no acute findings.  Incidental finding of 2 cm left thyroid nodule. I  agree with the radiologist interpretation   Cardiac Monitoring: / EKG:  The patient was maintained on a cardiac monitor.  I personally viewed and interpreted the cardiac monitored which showed an underlying rhythm of: Atrial fibrillation  Problem List / ED Course / Critical interventions / Medication management  Patient presents after a mechanical fall.  This occurred shortly prior to arrival.  She did not lose consciousness.  She did strike her head and is on Eliquis.  On arrival in the ED, she is alert and oriented.  She has a small hematoma to left parietal scalp.  She has minor abrasions to left knee and left elbow.  No deformities are noted.  Breathing is unlabored and lungs are clear to auscultation.  No chest wall or abdominal tenderness is present.  Tylenol was ordered for analgesia.  Tdap was ordered for tetanus prophylaxis.  Patient underwent basic lab work as well as imaging studies of areas of injury concern.  No injuries  were identified on imaging.  She did have an incidental finding of a left thyroid nodule.  Patient was ordered left knee brace for comfort.  She was discharged in stable condition. I ordered medication including Tylenol for analgesia Reevaluation of the patient after these medicines showed that the patient improved I have reviewed the patients home medicines and have made adjustments as needed   Social Determinants of Health:  Is independently, ambulates with cane at baseline        Final Clinical Impression(s) / ED Diagnoses Final diagnoses:  Fall, initial encounter    Rx / DC Orders ED Discharge Orders     None         Gloris Manchester, MD 08/31/22 1330

## 2022-08-31 NOTE — Discharge Instructions (Signed)
The testing done in the emergency department is reassuring.  No major injuries were identified on imaging studies.  There was an incidental finding of a 2 cm left thyroid nodule.  You should undergo an ultrasound of this that can be arranged through your primary care doctor.  Wear knee brace for comfort.  Take Tylenol as needed.  If you have persistent concerning knee discomfort, there is a telephone number below that you can call for an orthopedic doctor follow-up.

## 2022-08-31 NOTE — ED Triage Notes (Signed)
Pt reports falling while getting into the car. Pt states when falling she hit her head on the bumper of her car. Pt has abrasions to the left knee, left elbow, and left foot. Pt taking eliquis.

## 2022-08-31 NOTE — ED Notes (Signed)
Portable Xray at bedside.

## 2022-08-31 NOTE — ED Notes (Signed)
Lab at bedside

## 2022-08-31 NOTE — ED Notes (Addendum)
Pt ambulated to BR with standby assist. Usually uses personal cane, does not have cane with her today. Tolerated well.

## 2022-09-07 ENCOUNTER — Telehealth: Payer: Self-pay

## 2022-09-07 NOTE — Telephone Encounter (Signed)
Transition Care Management Unsuccessful Follow-up Telephone Call  Date of discharge and from where:  [facility]  Attempts:  1st Attempt  Reason for unsuccessful TCM follow-up call:  Left voice message   Lenard Forth [facility] Guide, [facility] [phone] [address] [place], [place], [place] [address] Phone: [phone] Email: [email or web address]

## 2022-09-08 ENCOUNTER — Telehealth: Payer: Self-pay

## 2022-09-08 NOTE — Progress Notes (Unsigned)
Assessment and Plan:  There are no diagnoses linked to this encounter.    Further disposition pending results of labs. Discussed med's effects and SE's.   Over 30 minutes of exam, counseling, chart review, and critical decision making was performed.   Future Appointments  Date Time Provider Department Center  09/09/2022  9:00 AM Raynelle Dick, NP [facility] None  11/05/2022  3:40 PM [place] Mood Dorothe Pea, MD [facility]  11/13/2022 11:00 AM [name], NP [facility] None  02/16/2023 10:00 AM [name], NP [facility] None  02/18/2023  2:00 PM Lomax, Amy, NP [facility] None  08/17/2023  2:30 PM Cranford, Archie Patten, NP [facility] None    ------------------------------------------------------------------------------------------------------------------   HPI There were no vitals taken for this visit. 82 y.o.female presents for  Past Medical History:  Diagnosis Date   Anxiety    Chronic lower back pain    Depression    DJD (degenerative joint disease)    knees, neck   GERD (gastroesophageal reflux disease)    has had dilitation in the past   Hypertension      Allergies  Allergen Reactions   Cephalosporins Rash    Current Outpatient Medications on File Prior to Visit  Medication Sig   acetaminophen (TYLENOL) 500 MG tablet Take 500 mg by mouth every 6 (six) hours as needed.   albuterol (VENTOLIN HFA) 108 (90 Base) MCG/ACT inhaler Inhale 2 puffs into the lungs every 6 (six) hours as needed for wheezing or shortness of breath.   apixaban (ELIQUIS) 5 MG TABS tablet TAKE 1 TABLET BY MOUTH TWICE  DAILY   Ascorbic Acid (VITAMIN C) 500 MG CAPS Take 500 mg by mouth.    benzonatate (TESSALON PERLES) 100 MG capsule Take 1 capsule (100 mg total) by mouth every 6 (six) hours as needed for cough.   busPIRone (BUSPAR) 10 MG tablet TAKE 1/2 TO 1 TABLET BY MOUTH 3  TIMES DAILY AS NEEDED FOR  ANXIETY, MOOD OR IRRITABILITY (Patient taking differently: Take 10 mg by mouth 3 (three)  times daily as needed (ANXIETY, MOOD OR IRRITABILITY).)   CHOLECALCIFEROL PO Take 5,000 Units by mouth daily.   Cyanocobalamin 2500 MCG TABS Take 2,500 mcg by mouth daily.   cyclobenzaprine (FLEXERIL) 10 MG tablet Take 1/2 to 1 tablet    2 to 3 x /day if needed for Muscle Spasms (Patient taking differently: Take 5-10 mg by mouth 3 (three) times daily as needed for muscle spasms. Take 1/2 to 1 tablet    2 to 3 x /day if needed for Muscle Spasms)   diltiazem (CARDIZEM CD) 300 MG 24 hr capsule Take 1 capsule (300 mg total) by mouth daily.   escitalopram (LEXAPRO) 20 MG tablet Take 1 tablet Daily for Mood, Anxiety & Irritability (Patient taking differently: Take 20 mg by mouth daily. Take 1 tablet Daily for Mood, Anxiety & Irritability)   ferrous sulfate 325 (65 FE) MG tablet Take 1 tablet (325 mg total) by mouth every other day.   ipratropium-albuterol (DUONEB) 0.5-2.5 (3) MG/3ML SOLN USE 1 VIAL IN NEBULIZER EVERY 4 HOURS (Patient taking differently: Take 3 mLs by nebulization every 4 (four) hours as needed (SOB/Wheezing).)   lansoprazole (PREVACID) 30 MG capsule Take  1 capsule Daily to Prevent Heartburn & Indigestion                                               /  TAKE                                              BY                                                         MOUTH   Magnesium 250 MG TABS Take 1 tablet by mouth daily.   metoprolol tartrate (LOPRESSOR) 25 MG tablet TAKE 2 TABLETS BY MOUTH IN THE  MORNING AND 1 TABLET BY MOUTH IN THE EVENING (Patient taking differently: Take 25-50 mg by mouth 2 (two) times daily. TAKE 2 TABLETS BY MOUTH IN THE  MORNING AND 1 TABLET BY MOUTH IN THE EVENING)   nitroGLYCERIN (NITROSTAT) 0.4 MG SL tablet Place 1 tablet (0.4 mg total) under the tongue every 5 (five) minutes as needed for chest pain.   oxybutynin (DITROPAN-XL) 10 MG 24 hr tablet Take 1 tablet  Daily for Bladder Control (Patient not taking: Reported  on 08/31/2022)   Probiotic Product (PROBIOTIC DAILY PO) Take 1 tablet by mouth daily.   psyllium (METAMUCIL) 58.6 % packet Take 1 packet by mouth daily.   zinc gluconate 50 MG tablet Take 50 mg by mouth daily.   No current facility-administered medications on file prior to visit.    ROS: all negative except above.   Physical Exam:  There were no vitals taken for this visit.  General Appearance: Well nourished, in no apparent distress. Eyes: PERRLA, EOMs, conjunctiva no swelling or erythema Sinuses: No Frontal/maxillary tenderness ENT/Mouth: Ext aud canals clear, TMs without erythema, bulging. No erythema, swelling, or exudate on post pharynx.  Tonsils not swollen or erythematous. Hearing normal.  Neck: Supple, thyroid normal.  Respiratory: Respiratory effort normal, BS equal bilaterally without rales, rhonchi, wheezing or stridor.  Cardio: RRR with no MRGs. Brisk peripheral pulses without edema.  Abdomen: Soft, + BS.  Non tender, no guarding, rebound, hernias, masses. Lymphatics: Non tender without lymphadenopathy.  Musculoskeletal: Full ROM, 5/5 strength, normal gait.  Skin: Warm, dry without rashes, lesions, ecchymosis.  Neuro: Cranial nerves intact. Normal muscle tone, no cerebellar symptoms. Sensation intact.  Psych: Awake and oriented X 3, normal affect, Insight and Judgment appropriate.     Raynelle Dick, NP 12:35 PM [facility]

## 2022-09-08 NOTE — Telephone Encounter (Signed)
Transition Care Management Unsuccessful Follow-up Telephone Call  Date of discharge and from where:  [facility]  Attempts:  2nd Attempt  Reason for unsuccessful TCM follow-up call:  Unable to leave message   Lenard Forth [facility] Guide, [facility] [phone] [address] [place], [place], [place] [address] Phone: [phone] Email: [email or web address]

## 2022-09-09 ENCOUNTER — Ambulatory Visit (INDEPENDENT_AMBULATORY_CARE_PROVIDER_SITE_OTHER): Payer: Medicare Other | Admitting: Nurse Practitioner

## 2022-09-09 ENCOUNTER — Encounter: Payer: Self-pay | Admitting: Nurse Practitioner

## 2022-09-09 ENCOUNTER — Ambulatory Visit
Admission: RE | Admit: 2022-09-09 | Discharge: 2022-09-09 | Disposition: A | Discharge status: Home / Self Care | Payer: Medicare Other | Source: Ambulatory Visit | Attending: Nurse Practitioner | Admitting: Nurse Practitioner

## 2022-09-09 VITALS — BP 104/68 | HR 69 | Temp 97.7°F | Ht 70.0 in | Wt 223.6 lb

## 2022-09-09 DIAGNOSIS — M25572 Pain in left ankle and joints of left foot: Secondary | ICD-10-CM | POA: Diagnosis not present

## 2022-09-09 DIAGNOSIS — I272 Pulmonary hypertension, unspecified: Secondary | ICD-10-CM | POA: Diagnosis not present

## 2022-09-09 DIAGNOSIS — M7989 Other specified soft tissue disorders: Secondary | ICD-10-CM | POA: Diagnosis not present

## 2022-09-09 DIAGNOSIS — M25472 Effusion, left ankle: Secondary | ICD-10-CM

## 2022-09-09 DIAGNOSIS — I4891 Unspecified atrial fibrillation: Secondary | ICD-10-CM | POA: Diagnosis not present

## 2022-09-09 NOTE — Patient Instructions (Signed)
Ankle Pain The ankle joint helps you stand on your leg and allows you to move around. Ankle pain can happen on either side or the back of the ankle. You may have pain in one ankle or both ankles. Ankle pain may be sharp and burning or dull and aching. There may be tenderness, stiffness, redness, or warmth around the ankle. Many things can cause ankle pain. These include an injury to the area and overuse of your ankle. Follow these instructions at home: Activity Rest your ankle as told by your health care provider. Avoid doing things that cause ankle pain. Do not use the injured limb to support your body weight until your provider says that you can. Use crutches as told by your provider. Ask your provider when it is safe to drive if you have a brace on your ankle. Do exercises as told by your provider. If you have a removable brace: Wear the brace as told by your provider. Remove it only as told by your provider. Check the skin around the brace every day. Tell your provider about any concerns. Loosen the brace if your toes tingle, become numb, or turn cold and blue. Keep the brace clean. If the brace is not waterproof: Do not let it get wet. Cover it with a watertight covering when you take a bath or shower. If you have an elastic bandage:  Remove it when you take a bath or a shower. Try not to move your ankle much. Wiggle your toes from time to time. This helps to prevent swelling. Adjust the bandage if it feels too tight. Loosen the bandage if your foot tingles, becomes numb, or turns cold and blue. Managing pain, stiffness, and swelling  If told, put ice on the painful area. If you have a removable brace or elastic bandage, remove it as told by your provider. Put ice in a plastic bag. Place a towel between your skin and the bag. Leave the ice on for 20 minutes, 2-3 times a day. If your skin turns bright red, remove the ice right away to prevent skin damage. The risk of damage is  higher if you cannot feel pain, heat, or cold. Move your toes often to reduce stiffness and swelling. Raise (elevate) your ankle above the level of your heart while you are sitting or lying down. General instructions Take over-the-counter and prescription medicines only as told by your provider. To help you and your provider, write down: How often you have ankle pain. Where the pain is. What the pain feels like. If you are told to wear a certain shoe or insole, make sure you wear it the right way and for as long as you are told. Contact a health care provider if: Your pain gets worse. Your pain does not get better with medicine. You have a fever or chills. You have more trouble walking. You have new symptoms. Your foot, leg, toes, or ankle tingles, becomes numb or swollen, or turns cold and blue. This information is not intended to replace advice given to you by your health care provider. Make sure you discuss any questions you have with your health care provider. Document Revised: 12/24/2021 Document Reviewed: 12/24/2021 Elsevier Patient Education  2024 Elsevier Inc.  

## 2022-09-14 DIAGNOSIS — H353132 Nonexudative age-related macular degeneration, bilateral, intermediate dry stage: Secondary | ICD-10-CM | POA: Diagnosis not present

## 2022-09-14 DIAGNOSIS — Z961 Presence of intraocular lens: Secondary | ICD-10-CM | POA: Diagnosis not present

## 2022-09-14 DIAGNOSIS — H43813 Vitreous degeneration, bilateral: Secondary | ICD-10-CM | POA: Diagnosis not present

## 2022-10-09 ENCOUNTER — Other Ambulatory Visit: Payer: Self-pay | Admitting: Internal Medicine

## 2022-10-09 DIAGNOSIS — F419 Anxiety disorder, unspecified: Secondary | ICD-10-CM

## 2022-10-21 ENCOUNTER — Other Ambulatory Visit: Payer: Self-pay | Admitting: Cardiology

## 2022-10-22 ENCOUNTER — Other Ambulatory Visit: Payer: Self-pay | Admitting: Nurse Practitioner

## 2022-10-22 DIAGNOSIS — F419 Anxiety disorder, unspecified: Secondary | ICD-10-CM

## 2022-11-04 ENCOUNTER — Encounter: Payer: Medicare Other | Admitting: Internal Medicine

## 2022-11-05 ENCOUNTER — Ambulatory Visit: Payer: Medicare Other | Admitting: Cardiology

## 2022-11-10 ENCOUNTER — Telehealth: Payer: Self-pay | Admitting: Pharmacy Technician

## 2022-11-10 DIAGNOSIS — Z5986 Financial insecurity: Secondary | ICD-10-CM

## 2022-11-10 NOTE — Progress Notes (Signed)
[facility])  [facility] Quality Pharmacy Team   11/10/2022  SARAHJANE BEDNAREK 08-02-1940 [id number]  Reason for referral: Medication assistance for Eliquis  Referral source:  patient Current insurance: [facility]  Outreach:  Successful telephone call with patient's daughter Arline Asp.  HIPAA identifiers verified. Patient 's daughter was calling to inquire about Eliquis patient assistance Patient has been assisted in the past. Per Arline Asp, patient is a household of 1 with [employer] [place] and whose reported income appears to meet the criteria of the [employer] patient assistance program. Patient seems to also meet the 3% out of pocket requirement as outlined by [employer]MS.    Medication Assistance Findings:  Medication assistance needs identified: Eliquis  Extra Help:  Not eligible for Extra Help Low Income Subsidy based on reported income and assets  Additional medication assistance options reviewed with patient as warranted:  No other options identified  Plan: I will route patient assistance letter to [facility] pharmacy technician who will coordinate patient assistance program application process for medications listed above.  [facility] pharmacy technician will assist with obtaining all required documents from both patient and provider(s) and submit application(s) once completed.  Thank you for allowing [facility] pharmacy to be a part of this patient's care.   Pattricia Boss, CPhT [place]  [facility] Office: [phone] Fax: [phone] Email: [email or web address]

## 2022-11-11 ENCOUNTER — Telehealth: Payer: Self-pay | Admitting: Pharmacy Technician

## 2022-11-11 DIAGNOSIS — Z5986 Financial insecurity: Secondary | ICD-10-CM

## 2022-11-11 NOTE — Progress Notes (Signed)
[facility])                                            [facility] Quality Pharmacy Team    11/11/2022  Darlene [name] [birth year] [id number]                                      Medication Assistance Referral  Referral From:  Patient  Medication/Company: [employer] / BMS Patient application portion:  Mailed Provider application portion: Faxed  to Dr. Dina Rich Provider address/fax verified via: Office website     Darlene [name], CPhT [place]  [facility] Office: [phone] Fax: [phone] Email: [name]

## 2022-11-12 ENCOUNTER — Encounter: Payer: Medicare Other | Admitting: Internal Medicine

## 2022-11-13 ENCOUNTER — Ambulatory Visit: Payer: Medicare Other | Admitting: Nurse Practitioner

## 2022-11-17 ENCOUNTER — Encounter: Payer: Medicare Other | Admitting: Internal Medicine

## 2022-11-26 ENCOUNTER — Ambulatory Visit: Payer: Medicare Other | Admitting: Nurse Practitioner

## 2022-12-01 ENCOUNTER — Telehealth: Payer: Self-pay | Admitting: Nurse Practitioner

## 2022-12-01 ENCOUNTER — Ambulatory Visit (INDEPENDENT_AMBULATORY_CARE_PROVIDER_SITE_OTHER): Payer: Medicare Other | Admitting: Nurse Practitioner

## 2022-12-01 ENCOUNTER — Other Ambulatory Visit: Payer: Self-pay | Admitting: Nurse Practitioner

## 2022-12-01 ENCOUNTER — Encounter: Payer: Self-pay | Admitting: Nurse Practitioner

## 2022-12-01 VITALS — BP 128/80 | HR 80 | Temp 97.9°F | Resp 16 | Ht 70.0 in | Wt 218.6 lb

## 2022-12-01 DIAGNOSIS — R7309 Other abnormal glucose: Secondary | ICD-10-CM

## 2022-12-01 DIAGNOSIS — K222 Esophageal obstruction: Secondary | ICD-10-CM

## 2022-12-01 DIAGNOSIS — R239 Unspecified skin changes: Secondary | ICD-10-CM

## 2022-12-01 DIAGNOSIS — G4733 Obstructive sleep apnea (adult) (pediatric): Secondary | ICD-10-CM | POA: Diagnosis not present

## 2022-12-01 DIAGNOSIS — G4737 Central sleep apnea in conditions classified elsewhere: Secondary | ICD-10-CM

## 2022-12-01 DIAGNOSIS — I272 Pulmonary hypertension, unspecified: Secondary | ICD-10-CM

## 2022-12-01 DIAGNOSIS — Z79899 Other long term (current) drug therapy: Secondary | ICD-10-CM | POA: Diagnosis not present

## 2022-12-01 DIAGNOSIS — E782 Mixed hyperlipidemia: Secondary | ICD-10-CM | POA: Diagnosis not present

## 2022-12-01 DIAGNOSIS — I4891 Unspecified atrial fibrillation: Secondary | ICD-10-CM

## 2022-12-01 DIAGNOSIS — E559 Vitamin D deficiency, unspecified: Secondary | ICD-10-CM

## 2022-12-01 DIAGNOSIS — K2100 Gastro-esophageal reflux disease with esophagitis, without bleeding: Secondary | ICD-10-CM

## 2022-12-01 DIAGNOSIS — D509 Iron deficiency anemia, unspecified: Secondary | ICD-10-CM

## 2022-12-01 DIAGNOSIS — Z860100 Personal history of colon polyps, unspecified: Secondary | ICD-10-CM

## 2022-12-01 DIAGNOSIS — G8929 Other chronic pain: Secondary | ICD-10-CM

## 2022-12-01 DIAGNOSIS — M5442 Lumbago with sciatica, left side: Secondary | ICD-10-CM | POA: Diagnosis not present

## 2022-12-01 DIAGNOSIS — M150 Primary generalized (osteo)arthritis: Secondary | ICD-10-CM

## 2022-12-01 DIAGNOSIS — M159 Polyosteoarthritis, unspecified: Secondary | ICD-10-CM

## 2022-12-01 DIAGNOSIS — F3342 Major depressive disorder, recurrent, in full remission: Secondary | ICD-10-CM

## 2022-12-01 DIAGNOSIS — L821 Other seborrheic keratosis: Secondary | ICD-10-CM

## 2022-12-01 NOTE — Progress Notes (Signed)
FOLLOW UP  Assessment and Plan:  A. Fib with RVR (HCC) Rate controlled on current agents; on elequis without bleeding concerns  Cardiology following  Pulmonary hypertension (HCC) Normal PFTs; abnormal sleep study; now on CPAP  Central sleep apnea associated with a. Fib (HCC) [name] following; on auto CPAP with good results; endorses 100% compliance  Morbid obesity (HCC) - BMI 30+ with sleep apnea Discussed appropriate BMI Diet modification. Physical activity. Encouraged/praised to build confidence.  Gastroesophageal reflux disease with esophagitis No suspected reflux complications (Barret/stricture). Lifestyle modification:  wt loss, avoid meals 2-3h before bedtime. Consider eliminating food triggers:  chocolate, caffeine, EtOH, acid/spicy food.  Esophageal stricture Continue GERD medications; follows with GI Discussed diet, avoiding triggers and other lifestyle changes  Primary osteoarthritis involving multiple joints Primarily knees, does fairly,tylenol, flexeril Discussed topical diclofenac/aspercreme Consider gabapentin or ortho referral for injections - declines further interventions at this time   Recurrent major depressive disorder, in full remission (HCC) Stable Lifestyle discussed: diet/exerise, sleep hygiene, stress management, hydration  Chronic low back pain with left-sided sciatica, unspecified back pain laterality Takes tylenol, flexeril PRN  History of colon polyps Repeat colonoscopy 05/2020 per GI - she declined referral, not planning to pursue further at this time Denies concerning sx  Anemia, iron deficiency Chronic recurrent for many years Stable on iron supplement; Monitor CBC, iron/ferritin  Vitamin D deficiency Continue supplement for goal of 60-100 Monitor Vitamin D levels  Cholesterol Discussed lifestyle modifications. Recommended diet heavy in fruits and veggies, omega 3's. Decrease consumption of animal meats, cheeses, and dairy  products. Remain active and exercise as tolerated. Continue to monitor. Check lipids/TSH  Medication management All medications discussed and reviewed in full. All questions and concerns regarding medications addressed.    Abnormal glucose Education: Reviewed 'ABCs' of diabetes management  Discussed goals to be met and/or maintained include A1C (<7) Blood pressure (<130/80) Cholesterol (LDL <70) Continue Eye Exam yearly  Continue Dental Exam Q6 mo Discussed dietary recommendations Discussed Physical Activity recommendations Check A1C  Skin changes Appears to be AK Refer to dermatology for overall skin check If unable to be seen in 2-3 weeks RTC for assessment with Dr. Oneta Rack.  Orders Placed This Encounter  Procedures   CBC with Differential/Platelet   COMPLETE METABOLIC PANEL WITH GFR   Lipid panel   Hemoglobin A1c   Notify office for further evaluation and treatment, questions or concerns if any reported s/s fail to improve.   The patient was advised to call back or seek an in-person evaluation if any symptoms worsen or if the condition fails to improve as anticipated.   Further disposition pending results of labs. Discussed med's effects and SE's.    I discussed the assessment and treatment plan with the patient. The patient was provided an opportunity to ask questions and all were answered. The patient agreed with the plan and demonstrated an understanding of the instructions.  Discussed med's effects and SE's. Screening labs and tests as requested with regular follow-up as recommended.  I provided 30 minutes of face-to-face time during this encounter including counseling, chart review, and critical decision making was preformed.  Today's Plan of Care is based on a patient-centered health care approach known as shared decision making - the decisions, tests and treatments allow for patient preferences and values to be balanced with clinical evidence.      Future  Appointments  Date Time Provider Department Center  12/28/2022  2:00 PM Dyann Kief, [place] [facility]  02/16/2023  10:00 AM [name], NP [facility] None  02/18/2023  2:00 PM Lomax, Amy, NP [facility] None  08/17/2023  2:30 PM Tanav Orsak, Archie Patten, NP [facility] None    Plan:   During the course of the visit the patient was educated and counseled about appropriate screening and preventive services including:   Pneumococcal vaccine  Prevnar 13 Influenza vaccine Td vaccine Screening electrocardiogram Bone densitometry screening Colorectal cancer screening Diabetes screening Glaucoma screening Nutrition counseling  Advanced directives: requested    HPI  82 y.o. female  presents for a follow up for has DJD (degenerative joint disease); Chronic lower back pain; Depression; GERD (gastroesophageal reflux disease); Morbid obesity (HCC) - BMI 30+ with OSA; History of colon polyps; Abnormal glucose; Hyperlipidemia, mixed; Vitamin D deficiency; Continuous leakage of urine; Esophageal stricture; Iron deficiency anemia; Atrial fibrillation with RVR (HCC); Hypoxemia requiring supplemental oxygen; LVH (left ventricular hypertrophy) due to hypertensive disease, without heart failure; Severe obstructive sleep apnea-hypopnea syndrome; Central sleep apnea associated with atrial fibrillation (HCC); Pulmonary hypertension (HCC); Physical deconditioning; and At moderate risk for fall on their problem list.   She reports a place on the bridge of her nose, right side that has been present for years however she has noticed the area enlarging.  She does have a large scattered area of AK along her back.  She is currently seeking a dermatology referral to Dr. Terri Piedra.  She has incontinence, has tried oxybutynin and myrbetriq that did not help. Wears depends and has declined further interventions.   She has been on paxil for depression for many years now changed to Lexapro and reports mood is stable.  Previously has taken benzos, no other SSRIs.   Patient was hospitalized in Jan/Feb 2021 with new onset Afib with RVR, LVH on ECHO, EF 55-60%, now on oral diltiazem 300 mg daily, lopressor (50 mg AM, 25 mg PM), elequis 5 mg BID. Dr. Wyline Mood is following. Had low risk NM study.   She had a sleep study by [name] demonstrating central sleep apnea, now on Auto CPAP and does endorse 100% compliance.   Has chronic intermittent lower back pain, using tylenol, flexeril. Off of oral diclofenac.   BMI is Body mass index is 31.37 kg/m., she has been working on diet, eating better living with daughter.  Wt Readings from Last 3 Encounters:  12/01/22 218 lb 9.6 oz (99.2 kg)  09/09/22 223 lb 9.6 oz (101.4 kg)  08/11/22 222 lb 3.2 oz (100.8 kg)   Today their BP is BP: 128/80  She does not workout. She denies chest pain, dizziness. Does have exertional dyspnea, ongoing for many years, felt r/t deconditioning.    She is not on cholesterol medication, reports hx of myalgias with unknown medication at one point, declines medications. Her cholesterol is not at goal. The cholesterol last visit was:   Lab Results  Component Value Date   CHOL 208 (H) 08/11/2022   HDL 71 08/11/2022   LDLCALC 114 (H) 08/11/2022   TRIG 118 08/11/2022   CHOLHDL 2.9 08/11/2022    She has not been working on diet and exercise for glucose management. Last A1C in the office was:  Lab Results  Component Value Date   HGBA1C 5.7 (H) 08/11/2022    Last GFR:  Lab Results  Component Value Date   GFRNONAA >60 08/31/2022   Patient is on Vitamin D supplement.   Lab Results  Component Value Date   VD25OH 78 08/11/2022      She has long hx of  iron def anemia, Had colonoscopy with polyps 05/2017, EGD 3.2019 by Dr. Russella Dar, no bleeding or ulcers. currently normal levels and has backed off on chronic iron supplement to every other day, has been on for several years due to recurrence.      Latest Ref Rng & Units 08/31/2022   10:55 AM  08/11/2022    3:36 PM 05/12/2022   10:18 AM  CBC  WBC 4.0 - 10.5 K/uL 8.0  6.5  5.9   Hemoglobin 12.0 - 15.0 g/dL 16.1  09.6  04.5   Hematocrit 36.0 - 46.0 % 40.0  40.7  39.4   Platelets 150 - 400 K/uL 190  197  193    She takes slow release iron 65 mg every other day Lab Results  Component Value Date   IRON 169 (H) 04/23/2021   TIBC 320 04/23/2021   FERRITIN 62 04/23/2021     Current Medications:    Current Outpatient Medications (Cardiovascular):    metoprolol tartrate (LOPRESSOR) 25 MG tablet, TAKE 2 TABLETS BY MOUTH IN THE  MORNING AND 1 TABLET BY MOUTH IN THE EVENING (Patient taking differently: Take 25-50 mg by mouth 2 (two) times daily. TAKE 2 TABLETS BY MOUTH IN THE  MORNING AND 1 TABLET BY MOUTH IN THE EVENING)   nitroGLYCERIN (NITROSTAT) 0.4 MG SL tablet, Place 1 tablet (0.4 mg total) under the tongue every 5 (five) minutes as needed for chest pain.   diltiazem (CARDIZEM CD) 300 MG 24 hr capsule, TAKE 1 CAPSULE BY MOUTH DAILY  Current Outpatient Medications (Respiratory):    albuterol (VENTOLIN HFA) 108 (90 Base) MCG/ACT inhaler, Inhale 2 puffs into the lungs every 6 (six) hours as needed for wheezing or shortness of breath.   ipratropium-albuterol (DUONEB) 0.5-2.5 (3) MG/3ML SOLN, USE 1 VIAL IN NEBULIZER EVERY 4 HOURS (Patient taking differently: Take 3 mLs by nebulization every 4 (four) hours as needed (SOB/Wheezing).)  Current Outpatient Medications (Analgesics):    acetaminophen (TYLENOL) 500 MG tablet, Take 500 mg by mouth every 6 (six) hours as needed.  Current Outpatient Medications (Hematological):    apixaban (ELIQUIS) 5 MG TABS tablet, TAKE 1 TABLET BY MOUTH TWICE  DAILY   Cyanocobalamin 2500 MCG TABS, Take 2,500 mcg by mouth daily.   ferrous sulfate 325 (65 FE) MG tablet, Take 1 tablet (325 mg total) by mouth every other day.  Current Outpatient Medications (Other):    Ascorbic Acid (VITAMIN C) 500 MG CAPS, Take 500 mg by mouth.    busPIRone (BUSPAR) 10 MG  tablet, TAKE 1/2 TO 1 TABLET BY MOUTH 3  TIMES DAILY AS NEEDED FOR  ANXIETY, MOOD OR IRRITABILITY   CHOLECALCIFEROL PO, Take 5,000 Units by mouth daily.   cyclobenzaprine (FLEXERIL) 10 MG tablet, Take 1/2 to 1 tablet    2 to 3 x /day if needed for Muscle Spasms (Patient taking differently: Take 5-10 mg by mouth 3 (three) times daily as needed for muscle spasms. Take 1/2 to 1 tablet    2 to 3 x /day if needed for Muscle Spasms)   escitalopram (LEXAPRO) 20 MG tablet, TAKE 1 TABLET BY MOUTH DAILY FOR MOOD, ANXIETY &amp; IRRITABILITY   lansoprazole (PREVACID) 30 MG capsule, Take  1 capsule Daily to Prevent Heartburn & Indigestion                                               /  TAKE                                              BY                                                         MOUTH   Magnesium 250 MG TABS, Take 1 tablet by mouth daily.   Probiotic Product (PROBIOTIC DAILY PO), Take 1 tablet by mouth daily.   psyllium (METAMUCIL) 58.6 % packet, Take 1 packet by mouth daily.   zinc gluconate 50 MG tablet, Take 50 mg by mouth daily.  Allergies:  Allergies  Allergen Reactions   Cephalosporins Rash     Medical History:  She has DJD (degenerative joint disease); Chronic lower back pain; Depression; GERD (gastroesophageal reflux disease); Morbid obesity (HCC) - BMI 30+ with OSA; History of colon polyps; Abnormal glucose; Hyperlipidemia, mixed; Vitamin D deficiency; Continuous leakage of urine; Esophageal stricture; Iron deficiency anemia; Atrial fibrillation with RVR (HCC); Hypoxemia requiring supplemental oxygen; LVH (left ventricular hypertrophy) due to hypertensive disease, without heart failure; Severe obstructive sleep apnea-hypopnea syndrome; Central sleep apnea associated with atrial fibrillation (HCC); Pulmonary hypertension (HCC); Physical deconditioning; and At moderate risk for fall on their problem list.   Health Maintenance:    Immunization History  Administered Date(s) Administered   Covid-19, Mrna,Vaccine(Spikevax)98yrs and older 11/30/2022   Fluad Quad(high Dose 65+) 12/04/2019, 12/21/2020   Influenza, High Dose Seasonal PF 01/29/2018, 02/03/2022, 11/30/2022   Influenza-Unspecified 12/09/2016, 11/28/2018   Moderna Sars-Covid-2 Vaccination 03/28/2019, 04/28/2019, 02/27/2020   Pneumococcal Conjugate-13 02/12/2016, 09/30/2017   Pneumococcal Polysaccharide-23 03/16/2006, 04/02/2008   Tdap 08/31/2022   Zoster, Live 07/31/2013   Health Maintenance  Topic Date Due   Zoster Vaccines- Shingrix (1 of 2) 01/14/1960   DEXA SCAN  Never done   COVID-19 Vaccine (5 - 2023-24 season) 01/25/2023   Medicare Annual Wellness (AWV)  08/11/2023   MAMMOGRAM  05/26/2024   DTaP/Tdap/Td (2 - Td or Tdap) 08/30/2032   Pneumonia Vaccine 77+ Years old  Completed   INFLUENZA VACCINE  Completed   HPV VACCINES  Aged Out   Colonoscopy  Discontinued    Patient Care Team: Lucky Cowboy, MD as PCP - General (Internal Medicine) Wyline Mood Dorothe Pea, MD as PCP - Cardiology (Cardiology)  Surgical History:  She has a past surgical history that includes Abdominal hysterectomy (03/1979); Cataract extraction, bilateral (Bilateral, 2014); and Breast biopsy (Left, 1986 and 1987). Family History:  Herfamily history includes Dementia in her brother; Heart disease in her father and mother; Hypertension in her daughter, father, and mother; Tremor in her sister. Social History:  She reports that she has never smoked. She has never used smokeless tobacco. She reports current alcohol use of about 3.0 standard drinks of alcohol per week. She reports that she does not use drugs.  Review of Systems  Constitutional:  Negative for malaise/fatigue and weight loss.  HENT:  Negative for hearing loss and tinnitus.   Eyes:  Negative for blurred vision and double vision.  Respiratory:  Negative for cough, shortness of breath and wheezing.   Cardiovascular:   Negative for chest pain, palpitations, orthopnea, claudication and leg swelling.  Gastrointestinal:  Negative for abdominal  pain, blood in stool, constipation, diarrhea, heartburn, melena, nausea and vomiting.  Genitourinary: Negative.   Musculoskeletal:  Positive for back pain (chronic intermittent lumbar) and falls (2 in last year, none in last 3 months, no injury, frequent near falls per patient). Negative for joint pain and myalgias.  Skin:  Negative for rash.  Neurological:  Negative for dizziness, tingling, sensory change, weakness and headaches.  Endo/Heme/Allergies:  Negative for polydipsia.  Psychiatric/Behavioral: Negative.    All other systems reviewed and are negative.  Physical Exam: Estimated body mass index is 31.37 kg/m as calculated from the following:   Height as of this encounter: 5\' 10"  (1.778 m).   Weight as of this encounter: 218 lb 9.6 oz (99.2 kg). BP 128/80   Pulse 80   Temp 97.9 F (36.6 C)   Resp 16   Ht 5\' 10"  (1.778 m)   Wt 218 lb 9.6 oz (99.2 kg)   SpO2 96%   BMI 31.37 kg/m  General Appearance: Well nourished, in no apparent distress.  Eyes: PERRLA, EOMs, conjunctiva no swelling or erythema, normal fundi and vessels.  Sinuses: No Frontal/maxillary tenderness  ENT/Mouth: Ext aud canals clear, normal light reflex with TMs without erythema, bulging. Mask in place; oral exam deferred. Hearing normal.  Neck: Supple, thyroid normal. No bruits  Respiratory: Respiratory effort normal, BS equal bilaterally without rales, rhonchi, wheezing or stridor.  Cardio: Irregularly irregular. Brisk peripheral pulses without edema.  Chest: symmetric, with normal excursions and percussion.  Breasts: Symmetric, without lumps, nipple discharge, retractions.  Abdomen: Soft, nontender, no guarding, rebound, hernias, masses, or organomegaly.  Lymphatics: Non tender without lymphadenopathy.  Genitourinary: Defer Musculoskeletal: Full ROM all peripheral extremities, 4/5  strength, and slow steady gait and sitting to standing. Mild kyphosis.  Skin: Warm, dry without rashes, lesions, ecchymosis. Neuro: Cranial nerves intact, reflexes equal bilaterally. Normal muscle tone, no cerebellar symptoms. Sensation intact.  Psych: Awake and oriented X 3, normal affect, Insight and Judgment appropriate.   Darlene [name] 12:02 PM [facility]

## 2022-12-01 NOTE — Patient Instructions (Signed)
Seborrheic Keratosis A seborrheic keratosis is a common, noncancerous (benign) skin growth. These growths are velvety, waxy, or rough spots that appear on the skin. They are often tan, brown, or black. The skin growths can be flat or raised and may be scaly. What are the causes? The cause of this condition is not known. What increases the risk? You are more likely to develop this condition if you: Have a family history of seborrheic keratosis. Are 66 years old or older. Are pregnant. Have had estrogen replacement therapy. What are the signs or symptoms? Symptoms of this condition include growths on the face, chest, shoulders, back, or other areas. These growths: Are usually painless, but may become irritated and itchy. Can be tan, yellow, brown, black, or other colors. Are slightly raised or have a flat surface. Are sometimes rough or wart-like in texture. Are often velvety or waxy on the surface. Are round or oval-shaped. Often occur in groups, but may occur as a single growth. How is this diagnosed? This condition is diagnosed with a medical history and physical exam. A sample of the growth may be tested (skin biopsy). You may also need to see a skin specialist (dermatologist). How is this treated? Treatment is not usually needed for this condition unless the growths are irritated or bleed often. You may also choose to have the growths removed if you do not like their appearance. Growth removal may include a procedure in which: Liquid nitrogen is applied to "freeze" off the growth (cryosurgery). This is the most common procedure. The growth is burned off with electricity (electrocautery). The growth is removed by scraping (curettage). Follow these instructions at home: Watch your growth or growths for any changes. Do not scratch or pick at the growth or growths. This can cause them to become irritated or infected. Contact a health care provider if: You suddenly have many new  growths. Your growth bleeds, itches, or hurts. Your growth suddenly becomes larger or changes color. Summary A seborrheic keratosis is a common, noncancerous skin growth. Treatment is not usually needed for this condition unless the growths are irritated or bleed often. Watch your growth or growths for any changes. Contact a health care provider if you suddenly have many new growths or your growth suddenly becomes larger or changes color. This information is not intended to replace advice given to you by your health care provider. Make sure you discuss any questions you have with your health care provider. Document Revised: 05/16/2021 Document Reviewed: 05/16/2021 Elsevier Patient Education  2024 [employer].

## 2022-12-01 NOTE — Telephone Encounter (Signed)
Pt called [facility] and they told her they need the referral before they could tell her when she could come in. The fax #: [phone]

## 2022-12-02 LAB — CBC WITH DIFFERENTIAL/PLATELET
Absolute Monocytes: 611 {cells}/uL (ref 200–950)
Basophils Absolute: 33 {cells}/uL (ref 0–200)
Basophils Relative: 0.5 %
Eosinophils Absolute: 91 {cells}/uL (ref 15–500)
Eosinophils Relative: 1.4 %
HCT: 41.2 % (ref 35.0–45.0)
Hemoglobin: 13.9 g/dL (ref 11.7–15.5)
Lymphs Abs: 1528 {cells}/uL (ref 850–3900)
MCH: 32.4 pg (ref 27.0–33.0)
MCHC: 33.7 g/dL (ref 32.0–36.0)
MCV: 96 fL (ref 80.0–100.0)
MPV: 11.6 fL (ref 7.5–12.5)
Monocytes Relative: 9.4 %
Neutro Abs: 4238 {cells}/uL (ref 1500–7800)
Neutrophils Relative %: 65.2 %
Platelets: 192 10*3/uL (ref 140–400)
RBC: 4.29 10*6/uL (ref 3.80–5.10)
RDW: 12.3 % (ref 11.0–15.0)
Total Lymphocyte: 23.5 %
WBC: 6.5 10*3/uL (ref 3.8–10.8)

## 2022-12-04 ENCOUNTER — Telehealth: Payer: Self-pay | Admitting: Pharmacy Technician

## 2022-12-04 DIAGNOSIS — Z5986 Financial insecurity: Secondary | ICD-10-CM

## 2022-12-04 NOTE — Progress Notes (Signed)
[facility])                                            [facility] Quality Pharmacy Team    12/04/2022  Darlene [name] March 01, 1941 [id number]  Received both patient and provider portion(s) of patient assistance application(s) for Eliquis. Faxed completed application and required documents into [employer].    Pattricia Boss, CPhT [place]  Office: [phone] Fax: [phone] Email: [name]

## 2022-12-09 ENCOUNTER — Telehealth: Payer: Self-pay | Admitting: *Deleted

## 2022-12-09 NOTE — Telephone Encounter (Signed)
Received a fax from [employer] ( Eliquis) stating that pt has been denied d/t not meeting her 3% out of pocket for 2024. Called and spoke with [employer] and was told that pt's out of pocket is $558.15 more. Attempted to contact pt no answer voicemail not set up.

## 2022-12-10 ENCOUNTER — Encounter: Payer: Self-pay | Admitting: Cardiology

## 2022-12-10 NOTE — Telephone Encounter (Signed)
Error

## 2022-12-10 NOTE — Telephone Encounter (Signed)
Call returned from daughter [facility] ) and pt. Both notified of BMS requirement.

## 2022-12-11 ENCOUNTER — Telehealth: Payer: Self-pay | Admitting: Pharmacy Technician

## 2022-12-11 DIAGNOSIS — Z5986 Financial insecurity: Secondary | ICD-10-CM

## 2022-12-11 NOTE — Progress Notes (Signed)
[facility])                                            [facility] Quality Pharmacy Team    12/11/2022  Darlene [name] Nov 05, 1940 [id number]  Care coordination call placed to [employer] in regard to Eliquis application.  Spoke to [place] who informs patient has not met the 3% OOP and needs to spend $558.15 more to qualify.   Inquired of Jed Limerick what amount had patient already spent and  he informed $395.85.  Also inquired of Jed Limerick what income was BMS basing the 3% on. Jed Limerick reports patient income via their verification system was about 20K more than what the patient wrote on the application. Jed Limerick informs patient can submit tax return which shows income and if income is lower then it can reduce that OOP spend.  Successful outreach to patient's daughter Darlene Asp, HIPAA verified. Informed Darlene Asp of the above information and she will bring tax return to my office early next week. Once received, it will be faxed to [employer] for a redetermination.  Pattricia Boss, CPhT [place]  Office: [phone] Fax: [phone] Email: [email or web address]

## 2022-12-16 ENCOUNTER — Telehealth (HOSPITAL_BASED_OUTPATIENT_CLINIC_OR_DEPARTMENT_OTHER): Payer: Self-pay | Admitting: *Deleted

## 2022-12-16 NOTE — Telephone Encounter (Signed)
Pt was approved for the [employer] patient assistance program from 12/16/22- 03/16/23.

## 2022-12-18 ENCOUNTER — Telehealth: Payer: Self-pay | Admitting: Pharmacy Technician

## 2022-12-18 DIAGNOSIS — Z5986 Financial insecurity: Secondary | ICD-10-CM

## 2022-12-18 NOTE — Progress Notes (Signed)
[facility])                                            [facility] Quality Pharmacy Team    12/18/2022  Darlene [name] Jan 01, 1941 [id number]  Care coordination call placed to [employer] in regard to Eliquis application.  Spoke to [name] who informs patient is APPROVED 12/16/2022-03/16/2023. Per [place] patient will get one shipment of 90 days supply. It was delivered to her home address today. Patient is aware.  Pattricia Boss, CPhT [place]  Office: [phone] Fax: [phone] Email: [email or web address]

## 2022-12-24 ENCOUNTER — Encounter: Payer: Medicare Other | Admitting: Internal Medicine

## 2022-12-24 NOTE — Progress Notes (Signed)
Cardiology Office Note:  .   Date:  12/28/2022  ID:  Jadee, Golebiewski 1940-05-09, MRN [id number] PCP: Lucky Cowboy, MD  [facility] Providers Cardiologist:  Dina Rich, MD    History of Present Illness: .   OTTO CARAWAY is a 82 y.o. female with history of permanent Afib, severe OSA on CPAP, HLD, Pulmonary HTN.  Last saw Dr. Wyline Mood 09/2021 and was stable.  Patient comes in for f/u. She's been waking up in the middle of the night to go to [place] and is sweating and heart is racing. Calms down in a few minutes. Uses her CPAP 3-6 hrs. Sometimes her mask isn't on right. She hasn't had it checked since she's been on it. Has been occurring about 2-3 times weekly.   ROS:    Studies Reviewed: Marland Kitchen    EKG Interpretation Date/Time:  Monday December 28 2022 14:14:41 EDT Ventricular Rate:  63 PR Interval:    QRS Duration:  90 QT Interval:  428 QTC Calculation: 437 R Axis:   80  Text Interpretation: Atrial fibrillation Nonspecific ST and T wave abnormality When compared with ECG of 31-Aug-2022 10:12, PREVIOUS ECG IS PRESENT Confirmed by Jacolyn Reedy [phone]) on 12/28/2022 2:25:13 PM    Prior CV Studies:   Echo 04/16/19 IMPRESSIONS     1. Left ventricular ejection fraction, by visual estimation, is 55 to  60%. The left ventricle has normal function. There is mildly increased  left ventricular hypertrophy.   2. Left ventricular diastolic parameters are indeterminate.   3. The left ventricle has no regional wall motion abnormalities.   4. Global right ventricle has normal systolic function.The right  ventricular size is moderately enlarged. No increase in right ventricular  wall thickness.   5. Left atrial size was severely dilated.   6. Right atrial size was moderately dilated.   7. The mitral valve is normal in structure. Mild mitral valve  regurgitation. No evidence of mitral stenosis.   8. The tricuspid valve is normal in structure.   9. The tricuspid valve is  normal in structure. Tricuspid valve  regurgitation is mild.  10. The aortic valve is tricuspid. Aortic valve regurgitation is not  visualized. No evidence of aortic valve sclerosis or stenosis.  11. The pulmonic valve was not well visualized. Pulmonic valve  regurgitation is not visualized.  12. Moderately elevated pulmonary artery systolic pressure.  13. The inferior vena cava is dilated in size with <50% respiratory  variability, suggesting right atrial pressure of 15 mmHg.       Risk Assessment/Calculations:    CHA2DS2-VASc Score = 3   This indicates a 3.2% annual risk of stroke. The patient's score is based upon: CHF History: 0 HTN History: 0 Diabetes History: 0 Stroke History: 0 Vascular Disease History: 0 Age Score: 2 Gender Score: 1            Physical Exam:   VS:  BP 100/66   Pulse 63   Ht 5\' 10"  (1.778 m)   Wt 218 lb (98.9 kg)   SpO2 94%   BMI 31.28 kg/m    Wt Readings from Last 3 Encounters:  12/28/22 218 lb (98.9 kg)  12/01/22 218 lb 9.6 oz (99.2 kg)  09/09/22 223 lb 9.6 oz (101.4 kg)    GEN: Obese in no acute distress NECK: No JVD; No carotid bruits CARDIAC:  irreg irreg,  no murmurs, rubs, gallops RESPIRATORY:  Clear to auscultation without rales, wheezing or rhonchi  ABDOMEN: Soft,  non-tender, non-distended EXTREMITIES:  No edema; No deformity   ASSESSMENT AND PLAN: .    Permanent AFib on Eliquis, metoprolol and diltiazem.HR 63 today.  She's waking up with heart racing several times a week. She hasn't had her CPAP checked since started on it and mask not always on properly. Check 2 week zio and have her f/u with [name] who manages her sleep.  -labs reviewed last month on [facility] and stable.  History of chest pain, no ischemia on NST 2021-no recent chest pain  Pulmonary HTN likely from severe OSA with normal RV on echo-BP running low.  Severe OSA on CPAP-needs f/u.         Dispo: f/u with Dr. Wyline Mood in 1 yr.   Signed, Jacolyn Reedy, PA-C

## 2022-12-28 ENCOUNTER — Encounter: Payer: Self-pay | Admitting: Physician Assistant

## 2022-12-28 ENCOUNTER — Ambulatory Visit: Payer: Medicare Other | Attending: Physician Assistant

## 2022-12-28 ENCOUNTER — Ambulatory Visit: Payer: Medicare Other | Attending: Physician Assistant | Admitting: Physician Assistant

## 2022-12-28 VITALS — BP 100/66 | HR 63 | Ht 70.0 in | Wt 218.0 lb

## 2022-12-28 DIAGNOSIS — R002 Palpitations: Secondary | ICD-10-CM

## 2022-12-28 DIAGNOSIS — I272 Pulmonary hypertension, unspecified: Secondary | ICD-10-CM

## 2022-12-28 DIAGNOSIS — I4891 Unspecified atrial fibrillation: Secondary | ICD-10-CM | POA: Diagnosis not present

## 2022-12-28 DIAGNOSIS — G4733 Obstructive sleep apnea (adult) (pediatric): Secondary | ICD-10-CM

## 2022-12-28 DIAGNOSIS — R0789 Other chest pain: Secondary | ICD-10-CM | POA: Diagnosis not present

## 2022-12-28 NOTE — Patient Instructions (Addendum)
Medication Instructions:  Your physician recommends that you continue on your current medications as directed. Please refer to the Current Medication list given to you today.   Labwork: None today  Testing/Procedures: ZIO XT- Long Term Monitor Instructions   Your physician has requested you wear your ZIO patch monitor____14___days.   This is a single patch monitor.  [employer] supplies one patch monitor per enrollment.  Additional stickers are not available.   Do not shower for the first 24 hours.  You may shower after the first 24 hours.   Press button if you feel a symptom. You will hear a small click.  Record Date, Time and Symptom in the Patient Log Book.   When you are ready to remove patch, follow instructions on last 2 pages of Patient Log Book.  Stick patch monitor onto last page of Patient Log Book.   Place Patient Log Book in [place] box.  Use locking tab on box and tape box closed securely.  The [employer] and [employer] has [employer] on it.  Please place in mailbox as soon as possible.  Your physician should have your test results approximately 7 days after the monitor has been mailed back to [facility].   Call [facility] Customer Care at [phone] if you have questions regarding your ZIO XT patch monitor.  Call them immediately if you see an orange light blinking on your monitor.   If your monitor falls off in less than 4 days contact our Monitor department at [phone].  If your monitor becomes loose or falls off after 4 days call [employer] at [phone] for suggestions on securing your monitor.    Follow-Up: 1 year Dr.Branch   Follow up with Dr.Dohmeier  Any Other Special Instructions Will Be Listed Below (If Applicable).  If you need a refill on your cardiac medications before your next appointment, please call your pharmacy.

## 2023-01-11 DIAGNOSIS — L570 Actinic keratosis: Secondary | ICD-10-CM | POA: Diagnosis not present

## 2023-01-11 DIAGNOSIS — D2239 Melanocytic nevi of other parts of face: Secondary | ICD-10-CM | POA: Diagnosis not present

## 2023-01-11 DIAGNOSIS — D225 Melanocytic nevi of trunk: Secondary | ICD-10-CM | POA: Diagnosis not present

## 2023-01-11 DIAGNOSIS — L814 Other melanin hyperpigmentation: Secondary | ICD-10-CM | POA: Diagnosis not present

## 2023-01-11 DIAGNOSIS — L821 Other seborrheic keratosis: Secondary | ICD-10-CM | POA: Diagnosis not present

## 2023-01-11 DIAGNOSIS — D485 Neoplasm of uncertain behavior of skin: Secondary | ICD-10-CM | POA: Diagnosis not present

## 2023-01-19 DIAGNOSIS — R002 Palpitations: Secondary | ICD-10-CM | POA: Diagnosis not present

## 2023-01-19 DIAGNOSIS — I4891 Unspecified atrial fibrillation: Secondary | ICD-10-CM | POA: Diagnosis not present

## 2023-01-26 ENCOUNTER — Telehealth: Payer: Self-pay

## 2023-01-26 NOTE — Telephone Encounter (Signed)
-----   Message from Jacolyn Reedy sent at 01/26/2023  7:36 AM EST ----- Monitor shows Afib rate controlled. Please make sure she f/u with sleep for her CPAP. Thanks.

## 2023-01-26 NOTE — Telephone Encounter (Signed)
Patient notified and verbalized understanding. Patient had no questions or concerns at this time. PCP copied 

## 2023-02-02 ENCOUNTER — Other Ambulatory Visit: Payer: Self-pay | Admitting: Pharmacy Technician

## 2023-02-02 DIAGNOSIS — Z5986 Financial insecurity: Secondary | ICD-10-CM

## 2023-02-02 NOTE — Progress Notes (Signed)
Pharmacy Medication Assistance Program Note    02/02/2023  Patient ID: Darlene [name], female   DOB: 12-08-1940, 82 y.o.   MRN: [id number]     02/02/2023  Outreach Medication One  Initial Outreach Date (Medication One) 02/02/2023  Manufacturer Medication One [employer]  [facility] Eliquis  Dose of Eliquis 5mg   Type of Radiographer, therapeutic Assistance  Date Application Sent to Patient 02/02/2023  Application Items Requested Application;Proof of Income;Proof of Out of Pocket Spend;Other  Date Application Sent to Prescriber 03/18/2023  Name of Prescriber Dina Rich     Re enrollment for [address] Carolyne Fiscal [facility]  Office: [phone] Fax: [phone] Email: [email or web address]

## 2023-02-16 ENCOUNTER — Ambulatory Visit (INDEPENDENT_AMBULATORY_CARE_PROVIDER_SITE_OTHER): Payer: Medicare Other | Admitting: Nurse Practitioner

## 2023-02-16 ENCOUNTER — Encounter: Payer: Self-pay | Admitting: Nurse Practitioner

## 2023-02-16 VITALS — BP 104/62 | HR 55 | Temp 97.5°F | Ht 69.0 in | Wt 224.0 lb

## 2023-02-16 DIAGNOSIS — Z79899 Other long term (current) drug therapy: Secondary | ICD-10-CM

## 2023-02-16 DIAGNOSIS — E559 Vitamin D deficiency, unspecified: Secondary | ICD-10-CM | POA: Diagnosis not present

## 2023-02-16 DIAGNOSIS — Z0000 Encounter for general adult medical examination without abnormal findings: Secondary | ICD-10-CM | POA: Diagnosis not present

## 2023-02-16 DIAGNOSIS — T17308S Unspecified foreign body in larynx causing other injury, sequela: Secondary | ICD-10-CM

## 2023-02-16 DIAGNOSIS — D509 Iron deficiency anemia, unspecified: Secondary | ICD-10-CM | POA: Diagnosis not present

## 2023-02-16 DIAGNOSIS — F3342 Major depressive disorder, recurrent, in full remission: Secondary | ICD-10-CM

## 2023-02-16 DIAGNOSIS — G8929 Other chronic pain: Secondary | ICD-10-CM

## 2023-02-16 DIAGNOSIS — K222 Esophageal obstruction: Secondary | ICD-10-CM

## 2023-02-16 DIAGNOSIS — Z860100 Personal history of colon polyps, unspecified: Secondary | ICD-10-CM

## 2023-02-16 DIAGNOSIS — K2100 Gastro-esophageal reflux disease with esophagitis, without bleeding: Secondary | ICD-10-CM

## 2023-02-16 DIAGNOSIS — E782 Mixed hyperlipidemia: Secondary | ICD-10-CM

## 2023-02-16 DIAGNOSIS — R7309 Other abnormal glucose: Secondary | ICD-10-CM | POA: Diagnosis not present

## 2023-02-16 DIAGNOSIS — Z1389 Encounter for screening for other disorder: Secondary | ICD-10-CM

## 2023-02-16 DIAGNOSIS — Z0001 Encounter for general adult medical examination with abnormal findings: Secondary | ICD-10-CM

## 2023-02-16 DIAGNOSIS — I4891 Unspecified atrial fibrillation: Secondary | ICD-10-CM

## 2023-02-16 DIAGNOSIS — G4737 Central sleep apnea in conditions classified elsewhere: Secondary | ICD-10-CM

## 2023-02-16 DIAGNOSIS — Z130 Encounter for screening for diseases of the blood and blood-forming organs and certain disorders involving the immune mechanism: Secondary | ICD-10-CM

## 2023-02-16 DIAGNOSIS — I272 Pulmonary hypertension, unspecified: Secondary | ICD-10-CM

## 2023-02-16 DIAGNOSIS — M150 Primary generalized (osteo)arthritis: Secondary | ICD-10-CM

## 2023-02-16 NOTE — Patient Instructions (Signed)
Esophageal Stricture  An esophageal stricture is where part of your esophagus gets too narrow. The esophagus is the part of your body that moves food from your mouth to your stomach. It can narrow if you have a disease or damage to the area. What are the causes? A stricture may be caused by: Gastroesophageal reflux disease (GERD). This is when food and stomach contents back up into your esophagus. Swallowing something harmful. Damage from medical tools. Radiation therapy. Cancer. Irritation and swelling of your esophagus. What are the signs or symptoms? A stricture can make it hard or painful to swallow. It also makes you more likely to choke. You may also have: Heartburn. Vomiting. Loss of weight for no known reason. How is this diagnosed? A stricture may be diagnosed based on your symptoms and an exam. You may also need tests. These may include: An upper endoscopy. This is when a tube called an endoscope is put into your esophagus. It's used to check for a stricture. A tissue sample may also be taken and looked at in a lab. Esophageal pH monitoring. This is when a tube measures how much stomach acid goes into your esophagus. It also checks how long the acid stays there. A barium swallow test. During this test, you swallow a chalky liquid that makes your esophagus easier to see. Then an X-ray is taken. How is this treated? Treatment depends on how bad the stricture is and what caused it. Treatment may include: Esophageal dilation. This is when tools are put in your esophagus to stretch it. A stent. This is a small tube put in your esophagus to keep it open. The stent may stay in place for a few weeks. Medicines. Follow these instructions at home: Eating and drinking Follow instructions from your health care provider about what you may eat and drink. Eat soft foods that are easy to swallow. When you eat: Sit upright. Cut your food into small pieces. Chew well. Eat slowly. Stop when  you're full. Do not drink alcohol. Do not eat in the 2 hours before you go to bed. Do not eat things that can make heartburn worse. These include: Red meat. Fried and fatty foods. Spicy foods. Soda. Foods and drinks with tomato in them. Chocolate. General instructions Take over-the-counter and prescription medicines only as told by your provider. Do not use any products that contain nicotine or tobacco. These products include cigarettes, chewing tobacco, and vaping devices, such as e-cigarettes. If you need help quitting, ask your provider. When you're in bed, raise your head with pillows. This can help stop stomach contents from backing up into your esophagus while you sleep. Lose weight if needed. Wear loose clothes. Return to your normal activities as told by your provider. Ask your provider what activities are safe for you. Contact a health care provider if: You have trouble eating. You have trouble swallowing. You vomit food and liquid. Your symptoms don't get better with treatment. Get help right away if: You vomit every time you eat or drink. You can't keep down your saliva. This information is not intended to replace advice given to you by your health care provider. Make sure you discuss any questions you have with your health care provider. Document Revised: 05/29/2022 Document Reviewed: 05/29/2022 Elsevier Patient Education  2024 [employer].

## 2023-02-16 NOTE — Progress Notes (Signed)
CPE  Assessment and Plan:  CPE Due annually  Health maintenance reviewed Healthily lifestyle goals set   A. Fib with RVR (HCC) Rate controlled on current agents; on elequis without bleeding concerns  Cardiology following  Pulmonary hypertension (HCC) Normal PFTs; abnormal sleep study; now on CPAP  Central sleep apnea associated with a. Fib (HCC) [name] following; on auto CPAP. Endorses 100% compliance  Morbid obesity (HCC) - BMI 30+ with sleep apnea Discussed appropriate BMI Diet modification. Physical activity. Encouraged/praised to build confidence.  Gastroesophageal reflux disease with esophagitis No suspected reflux complications (Barret/stricture). Lifestyle modification:  wt loss, avoid meals 2-3h before bedtime. Consider eliminating food triggers:  chocolate, caffeine, EtOH, acid/spicy food.  Esophageal stricture/Choking Refer back to GI, Dr. Russella Dar for consultation of esophageal stretching secondary to increase in choking.   Continue GERD medications. Discussed diet, avoiding triggers and other lifestyle changes  Primary osteoarthritis involving multiple joints Primarily knees, does fairly,tylenol, flexeril Discussed topical diclofenac/aspercreme Consider gabapentin or ortho referral for injections - declines further interventions at this time   Recurrent major depressive disorder, in full remission (HCC) Stable Lifestyle discussed: diet/exerise, sleep hygiene, stress management, hydration.  Chronic low back pain with left-sided sciatica, unspecified back pain laterality Continue Tylenol, flexeril PRN  History of colon polyps Repeat colonoscopy 05/2020 per GI - she declined referral, not planning to pursue further at this time Denies concerning sx  Anemia, iron deficiency Chronic recurrent for many years Stable on iron supplement; Monitor CBC, iron/ferritin Discussed iron rich dietary foods  Vitamin D deficiency Continue supplement for goal of  60-100 Monitor Vitamin D levels  Cholesterol Discussed lifestyle modifications. Recommended diet heavy in fruits and veggies, omega 3's. Decrease consumption of animal meats, cheeses, and dairy products. Remain active and exercise as tolerated. Continue to monitor. Check lipids/TSH  Medication management All medications discussed and reviewed in full. All questions and concerns regarding medications addressed.    Abnormal glucose Education: Reviewed 'ABCs' of diabetes management  Discussed goals to be met and/or maintained include A1C (<7) Blood pressure (<130/80) Cholesterol (LDL <70) Continue Eye Exam yearly  Continue Dental Exam Q6 mo Discussed dietary recommendations Discussed Physical Activity recommendations Foot exam UTD Check A1C  Orders Placed This Encounter  Procedures   CBC with Differential/Platelet   COMPLETE METABOLIC PANEL WITH GFR   Lipid panel   TSH   Hemoglobin A1c   Insulin, random   VITAMIN D 25 Hydroxy (Vit-D Deficiency, Fractures)   Urinalysis, Routine w reflex microscopic   Microalbumin / creatinine urine ratio   Vitamin B12   Magnesium   Ambulatory referral to Gastroenterology    Referral Priority:   Routine    Referral Type:   Consultation    Referral Reason:   Specialty Services Required    Referred to Provider:   Meryl Dare, MD    Number of Visits Requested:   1   Notify office for further evaluation and treatment, questions or concerns if any reported s/s fail to improve.   The patient was advised to call back or seek an in-person evaluation if any symptoms worsen or if the condition fails to improve as anticipated.   Further disposition pending results of labs. Discussed med's effects and SE's.    I discussed the assessment and treatment plan with the patient. The patient was provided an opportunity to ask questions and all were answered. The patient agreed with the plan and demonstrated an understanding of the  instructions.  Discussed med's effects and SE's. Screening  labs and tests as requested with regular follow-up as recommended.  I provided 40 minutes of face-to-face time during this encounter including counseling, chart review, and critical decision making was preformed.  Today's Plan of Care is based on a patient-centered health care approach known as shared decision making - the decisions, tests and treatments allow for patient preferences and values to be balanced with clinical evidence.    Future Appointments  Date Time Provider Department Center  02/18/2023  2:00 PM Shawnie Dapper, NP [facility] None  08/17/2023  2:30 PM [name], NP [facility] None  02/25/2024 10:00 AM Lucky Cowboy, MD [facility] None    Plan:   During the course of the visit the patient was educated and counseled about appropriate screening and preventive services including:   Pneumococcal vaccine  Prevnar 13 Influenza vaccine Td vaccine Screening electrocardiogram Bone densitometry screening Colorectal cancer screening Diabetes screening Glaucoma screening Nutrition counseling  Advanced directives: requested    HPI  82 y.o. female  presents for a general CPE.  She has DJD (degenerative joint disease); Chronic lower back pain; Depression; GERD (gastroesophageal reflux disease); Morbid obesity (HCC) - BMI 30+ with OSA; History of colon polyps; Abnormal glucose; Hyperlipidemia, mixed; Vitamin D deficiency; Continuous leakage of urine; Esophageal stricture; Iron deficiency anemia; Atrial fibrillation with RVR (HCC); Hypoxemia requiring supplemental oxygen; LVH (left ventricular hypertrophy) due to hypertensive disease, without heart failure; Severe obstructive sleep apnea-hypopnea syndrome; Central sleep apnea associated with atrial fibrillation (HCC); Pulmonary hypertension (HCC); Physical deconditioning; and At moderate risk for fall on their problem list.   Overall she reports feeling well today.    She  lives with her daughter and family, they drive her when needed. Didn't want to bother getting new driver's license. She continues to watch her 2 young grandchildren.   Notes increase in difficulty swallowing intermittently with choking episodes.  Choking episodes include all types and consistencies of foods and liquids.  Denies any associated SOB, continuous coughing and/or aspiration PNA symptoms.  She has a hx of esophageal stricture and has had esophogeal stretching in the past that was effective.    Uses cane for balance.  No falls in the last year.  She has incontinence, has tried oxybutynin and myrbetriq that did not help. Wears depends and has declined further interventions.   She has been on paxil for depression for many years now changed to Lexapro and reports mood is stable. Previously has taken benzos, no other SSRIs.   Patient was hospitalized in Jan/Feb 2021 with new onset Afib with RVR, LVH on ECHO, EF 55-60%, now on oral diltiazem 300 mg daily, lopressor (50 mg AM, 25 mg PM), elequis 5 mg BID. Dr. Wyline Mood is following. Had low risk NM study.   She had a sleep study by [name] demonstrating central sleep apnea, now on Auto CPAP and does endorse 100% compliance. She does not the mask is uncomfortable at times.  Feels as though she wakes up 2-3 times nightly in a sweat with the mask is not in there right place.    Has chronic intermittent lower back pain, using tylenol, flexeril. Off of oral diclofenac.   BMI is Body mass index is 33.08 kg/m., she has been working on diet, eating better living with daughter.  Wt Readings from Last 3 Encounters:  02/16/23 224 lb (101.6 kg)  12/28/22 218 lb (98.9 kg)  12/01/22 218 lb 9.6 oz (99.2 kg)   Today their BP is BP: 104/62  She does not workout. She  denies chest pain, dizziness. Does have exertional dyspnea, ongoing for many years, felt r/t deconditioning.    She is not on cholesterol medication, reports hx of myalgias with unknown  medication at one point, declines medications. Her cholesterol is not at goal. The cholesterol last visit was:   Lab Results  Component Value Date   CHOL 199 12/01/2022   HDL 77 12/01/2022   LDLCALC 105 (H) 12/01/2022   TRIG 76 12/01/2022   CHOLHDL 2.6 12/01/2022    She has not been working on diet and exercise for glucose management. Last A1C in the office was:  Lab Results  Component Value Date   HGBA1C 5.8 (H) 12/01/2022    Last GFR:  Lab Results  Component Value Date   GFRNONAA >60 08/31/2022   Patient is on Vitamin D supplement.   Lab Results  Component Value Date   VD25OH 78 08/11/2022      She has long hx of iron def anemia, Had colonoscopy with polyps 05/2017, EGD 3.2019 by Dr. Russella Dar, no bleeding or ulcers. currently normal levels and has backed off on chronic iron supplement to every other day, has been on for several years due to recurrence.      Latest Ref Rng & Units 12/01/2022   12:23 PM 08/31/2022   10:55 AM 08/11/2022    3:36 PM  CBC  WBC 3.8 - 10.8 Thousand/uL 6.5  8.0  6.5   Hemoglobin 11.7 - 15.5 g/dL 16.1  09.6  04.5   Hematocrit 35.0 - 45.0 % 41.2  40.0  40.7   Platelets 140 - 400 Thousand/uL 192  190  197    She takes slow release iron 65 mg every other day Lab Results  Component Value Date   IRON 169 (H) 04/23/2021   TIBC 320 04/23/2021   FERRITIN 62 04/23/2021     Current Medications:    Current Outpatient Medications (Cardiovascular):    diltiazem (CARDIZEM CD) 300 MG 24 hr capsule, TAKE 1 CAPSULE BY MOUTH DAILY   metoprolol tartrate (LOPRESSOR) 25 MG tablet, TAKE 2 TABLETS BY MOUTH IN THE  MORNING AND 1 TABLET BY MOUTH IN THE EVENING (Patient taking differently: Take 25-50 mg by mouth 2 (two) times daily. TAKE 2 TABLETS BY MOUTH IN THE  MORNING AND 1 TABLET BY MOUTH IN THE EVENING)   nitroGLYCERIN (NITROSTAT) 0.4 MG SL tablet, Place 1 tablet (0.4 mg total) under the tongue every 5 (five) minutes as needed for chest pain.  Current Outpatient  Medications (Respiratory):    albuterol (VENTOLIN HFA) 108 (90 Base) MCG/ACT inhaler, Inhale 2 puffs into the lungs every 6 (six) hours as needed for wheezing or shortness of breath.   ipratropium-albuterol (DUONEB) 0.5-2.5 (3) MG/3ML SOLN, USE 1 VIAL IN NEBULIZER EVERY 4 HOURS (Patient taking differently: Take 3 mLs by nebulization every 4 (four) hours as needed (SOB/Wheezing).)  Current Outpatient Medications (Analgesics):    acetaminophen (TYLENOL) 500 MG tablet, Take 500 mg by mouth every 6 (six) hours as needed.  Current Outpatient Medications (Hematological):    apixaban (ELIQUIS) 5 MG TABS tablet, TAKE 1 TABLET BY MOUTH TWICE  DAILY   Cyanocobalamin 2500 MCG TABS, Take 2,500 mcg by mouth daily.   ferrous sulfate 325 (65 FE) MG tablet, Take 1 tablet (325 mg total) by mouth every other day.  Current Outpatient Medications (Other):    Ascorbic Acid (VITAMIN C) 500 MG CAPS, Take 500 mg by mouth.    busPIRone (BUSPAR) 10 MG tablet, TAKE 1/2 TO  1 TABLET BY MOUTH 3  TIMES DAILY AS NEEDED FOR  ANXIETY, MOOD OR IRRITABILITY   CHOLECALCIFEROL PO, Take 5,000 Units by mouth daily.   cyclobenzaprine (FLEXERIL) 10 MG tablet, Take 1/2 to 1 tablet    2 to 3 x /day if needed for Muscle Spasms (Patient taking differently: Take 5-10 mg by mouth 3 (three) times daily as needed for muscle spasms. Take 1/2 to 1 tablet    2 to 3 x /day if needed for Muscle Spasms)   escitalopram (LEXAPRO) 20 MG tablet, TAKE 1 TABLET BY MOUTH DAILY FOR MOOD, ANXIETY &amp; IRRITABILITY   lansoprazole (PREVACID) 30 MG capsule, Take  1 capsule Daily to Prevent Heartburn & Indigestion                                               /                                                       TAKE                                              BY                                                         MOUTH   Magnesium 250 MG TABS, Take 1 tablet by mouth daily.   Probiotic Product (PROBIOTIC DAILY PO), Take 1 tablet by mouth daily.   psyllium  (METAMUCIL) 58.6 % packet, Take 1 packet by mouth daily.   zinc gluconate 50 MG tablet, Take 50 mg by mouth daily.  Allergies:  Allergies  Allergen Reactions   Cephalosporins Rash     Medical History:  She has DJD (degenerative joint disease); Chronic lower back pain; Depression; GERD (gastroesophageal reflux disease); Morbid obesity (HCC) - BMI 30+ with OSA; History of colon polyps; Abnormal glucose; Hyperlipidemia, mixed; Vitamin D deficiency; Continuous leakage of urine; Esophageal stricture; Iron deficiency anemia; Atrial fibrillation with RVR (HCC); Hypoxemia requiring supplemental oxygen; LVH (left ventricular hypertrophy) due to hypertensive disease, without heart failure; Severe obstructive sleep apnea-hypopnea syndrome; Central sleep apnea associated with atrial fibrillation (HCC); Pulmonary hypertension (HCC); Physical deconditioning; and At moderate risk for fall on their problem list.   Health Maintenance:   Immunization History  Administered Date(s) Administered   Fluad Quad(high Dose 65+) 12/04/2019, 12/21/2020   Influenza, High Dose Seasonal PF 01/29/2018, 02/03/2022, 11/30/2022   Influenza-Unspecified 12/09/2016, 11/28/2018   Moderna Covid-19 Fall Seasonal Vaccine 41yrs & older 11/30/2022   Moderna Sars-Covid-2 Vaccination 03/28/2019, 04/28/2019, 02/27/2020   Pneumococcal Conjugate-13 02/12/2016, 09/30/2017   Pneumococcal Polysaccharide-23 03/16/2006, 04/02/2008   Tdap 08/31/2022   Zoster, Live 07/31/2013   Health Maintenance  Topic Date Due   Zoster Vaccines- Shingrix (1 of 2) 01/14/1960   DEXA SCAN  Never done   COVID-19 Vaccine (5 - 2023-24 season) 01/25/2023   Medicare Annual Wellness (  AWV)  08/11/2023   MAMMOGRAM  05/26/2024   DTaP/Tdap/Td (2 - Td or Tdap) 08/30/2032   Pneumonia Vaccine 51+ Years old  Completed   INFLUENZA VACCINE  Completed   HPV VACCINES  Aged Out   Colonoscopy  Discontinued   Tetanus: Declines, cost  Pap: remote MGM: gets annually at  bre[facility] center 05/2022 DEXA: remote, normal, declines further; discussed risk of falls and fractures; she expresses understanding Colonoscopy: 05/2017, polyps  - due 05/2020, but she decided not to pursue further  CXR 03/2019 - small hiatal hernia   Last Dental Exam: Dr. Langston Masker, last visit 2024, goes q48m Last Eye Exam: [facility], 2024, mild cataracts  Patient Care Team: Lucky Cowboy, MD as PCP - General (Internal Medicine) Wyline Mood Dorothe Pea, MD as PCP - Cardiology (Cardiology)  Surgical History:  She has a past surgical history that includes Abdominal hysterectomy (03/1979); Cataract extraction, bilateral (Bilateral, 2014); and Breast biopsy (Left, 1986 and 1987). Family History:  Herfamily history includes Dementia in her brother; Heart disease in her father and mother; Hypertension in her daughter, father, and mother; Tremor in her sister. Social History:  She reports that she has never smoked. She has never used smokeless tobacco. She reports current alcohol use of about 3.0 standard drinks of alcohol per week. She reports that she does not use drugs.   Review of Systems  Constitutional:  Negative for malaise/fatigue and weight loss.  HENT:  Negative for hearing loss and tinnitus.   Eyes:  Negative for blurred vision and double vision.  Respiratory:  Negative for cough, shortness of breath and wheezing.   Cardiovascular:  Negative for chest pain, palpitations, orthopnea, claudication and leg swelling.  Gastrointestinal:  Negative for abdominal pain, blood in stool, constipation, diarrhea, heartburn, melena, nausea and vomiting.  Genitourinary: Negative.   Musculoskeletal:  Positive for back pain (chronic intermittent lumbar) and falls. Negative for joint pain and myalgias.  Skin:  Negative for rash.  Neurological:  Negative for dizziness, tingling, sensory change, weakness and headaches.  Endo/Heme/Allergies:  Negative for polydipsia.  Psychiatric/Behavioral: Negative.     All other systems reviewed and are negative.    Physical Exam: Estimated body mass index is 33.08 kg/m as calculated from the following:   Height as of this encounter: 5\' 9"  (1.753 m).   Weight as of this encounter: 224 lb (101.6 kg). BP 104/62   Pulse (!) 55   Temp (!) 97.5 F (36.4 C)   Ht 5\' 9"  (1.753 m)   Wt 224 lb (101.6 kg)   SpO2 98%   BMI 33.08 kg/m  General Appearance: Well nourished, in no apparent distress.  Eyes: PERRLA, EOMs, conjunctiva no swelling or erythema, normal fundi and vessels.  Sinuses: No Frontal/maxillary tenderness  ENT/Mouth: Ext aud canals clear, normal light reflex with TMs without erythema, bulging. Mask in place; oral exam deferred. Hearing normal.  Neck: Supple, thyroid normal. No bruits  Respiratory: Respiratory effort normal, BS equal bilaterally without rales, rhonchi, wheezing or stridor.  Cardio: Irregularly irregular. Brisk peripheral pulses without edema.  Chest: symmetric, with normal excursions and percussion.  Breasts: Symmetric, without lumps, nipple discharge, retractions.  Abdomen: Soft, nontender, no guarding, rebound, hernias, masses, or organomegaly.  Lymphatics: Non tender without lymphadenopathy.  Genitourinary: Defer Musculoskeletal: Full ROM all peripheral extremities, 4/5 strength, and slow steady gait and sitting to standing. Mild kyphosis.  Skin: Warm, dry without rashes, lesions, ecchymosis. Neuro: Cranial nerves intact, reflexes equal bilaterally. Normal muscle tone, no cerebellar symptoms. Sensation intact.  Psych: Awake and oriented X 3, normal affect, Insight and Judgment appropriate.   Amyria Komar 10:09 AM [facility]

## 2023-02-17 LAB — URINALYSIS, ROUTINE W REFLEX MICROSCOPIC
Bilirubin Urine: NEGATIVE
Glucose, UA: NEGATIVE
Hgb urine dipstick: NEGATIVE
Ketones, ur: NEGATIVE
Leukocytes,Ua: NEGATIVE
Nitrite: NEGATIVE
Protein, ur: NEGATIVE
Specific Gravity, Urine: 1.023 (ref 1.001–1.035)
pH: 7 (ref 5.0–8.0)

## 2023-02-17 LAB — COMPLETE METABOLIC PANEL WITH GFR
AG Ratio: 2 (calc) (ref 1.0–2.5)
ALT: 12 U/L (ref 6–29)
AST: 15 U/L (ref 10–35)
Albumin: 4 g/dL (ref 3.6–5.1)
Alkaline phosphatase (APISO): 58 U/L (ref 37–153)
BUN: 17 mg/dL (ref 7–25)
CO2: 30 mmol/L (ref 20–32)
Calcium: 9.3 mg/dL (ref 8.6–10.4)
Chloride: 105 mmol/L (ref 98–110)
Creat: 0.72 mg/dL (ref 0.60–0.95)
Globulin: 2 g/dL (ref 1.9–3.7)
Glucose, Bld: 89 mg/dL (ref 65–99)
Potassium: 4.4 mmol/L (ref 3.5–5.3)
Sodium: 142 mmol/L (ref 135–146)
Total Bilirubin: 1.1 mg/dL (ref 0.2–1.2)
Total Protein: 6 g/dL — ABNORMAL LOW (ref 6.1–8.1)
eGFR: 83 mL/min/{1.73_m2} (ref >=60)

## 2023-02-17 LAB — CBC WITH DIFFERENTIAL/PLATELET
Absolute Lymphocytes: 1485 {cells}/uL (ref 850–3900)
Absolute Monocytes: 487 {cells}/uL (ref 200–950)
Basophils Absolute: 29 {cells}/uL (ref 0–200)
Basophils Relative: 0.5 %
Eosinophils Absolute: 87 {cells}/uL (ref 15–500)
Eosinophils Relative: 1.5 %
HCT: 39.3 % (ref 35.0–45.0)
Hemoglobin: 13 g/dL (ref 11.7–15.5)
MCH: 32.4 pg (ref 27.0–33.0)
MCHC: 33.1 g/dL (ref 32.0–36.0)
MCV: 98 fL (ref 80.0–100.0)
MPV: 12.1 fL (ref 7.5–12.5)
Monocytes Relative: 8.4 %
Neutro Abs: 3712 {cells}/uL (ref 1500–7800)
Neutrophils Relative %: 64 %
Platelets: 188 10*3/uL (ref 140–400)
RBC: 4.01 10*6/uL (ref 3.80–5.10)
RDW: 12.7 % (ref 11.0–15.0)
Total Lymphocyte: 25.6 %
WBC: 5.8 10*3/uL (ref 3.8–10.8)

## 2023-02-17 LAB — LIPID PANEL
Cholesterol: 191 mg/dL (ref <200)
HDL: 70 mg/dL (ref >=50)
LDL Cholesterol (Calc): 105 mg/dL — ABNORMAL HIGH
Non-HDL Cholesterol (Calc): 121 mg/dL (ref <130)
Total CHOL/HDL Ratio: 2.7 (calc) (ref <5.0)
Triglycerides: 70 mg/dL (ref <150)

## 2023-02-17 LAB — MICROALBUMIN / CREATININE URINE RATIO
Creatinine, Urine: 139 mg/dL (ref 20–275)
Microalb Creat Ratio: 4 mg/g{creat} (ref <30)
Microalb, Ur: 0.6 mg/dL

## 2023-02-17 LAB — INSULIN, RANDOM: Insulin: 6.1 u[IU]/mL

## 2023-02-17 LAB — HEMOGLOBIN A1C
Hgb A1c MFr Bld: 5.6 %{Hb} (ref <5.7)
Mean Plasma Glucose: 114 mg/dL
eAG (mmol/L): 6.3 mmol/L

## 2023-02-17 LAB — VITAMIN D 25 HYDROXY (VIT D DEFICIENCY, FRACTURES): Vit D, 25-Hydroxy: 73 ng/mL (ref 30–100)

## 2023-02-17 LAB — VITAMIN B12: Vitamin B-12: 1326 pg/mL — ABNORMAL HIGH (ref 200–1100)

## 2023-02-17 LAB — TSH: TSH: 1.47 m[IU]/L (ref 0.40–4.50)

## 2023-02-17 LAB — MAGNESIUM: Magnesium: 1.8 mg/dL (ref 1.5–2.5)

## 2023-02-17 NOTE — Progress Notes (Unsigned)
PATIENT: Darlene [name] DOB: 07/24/1940  REASON FOR VISIT: follow up HISTORY FROM: patient  No chief complaint on file.    HISTORY OF PRESENT ILLNESS:  02/17/23 ALL:  Darlene [name] returns for follow up for OSA on CPAP.     02/17/2022 ALL:  Darlene [name] returns for follow up for OSA on CPAP. She is doing well on therapy. She is using CPAP nightly for about 6-7 hours. She does feel improved sleep quality and wakes refreshed. ESS and FSS elevated but she does not feel sleepiness limits functioning. She rests when she can. She feels atrial fib and med side effects cause her to feel so tired.     09/10/2021 ALL: Darlene [name] returns for follow up for OSA on CPAP. She continues to struggle with finding a mask that works well for her. She has tried two different nasal pillow masks and a traditional FFM. She has leaks with most all or has difficulty with dry mouth. She used a nasal pillow that seemed to work best but caused skin irritation to nares. She has not tried a barrier cream. She has not noted any significant benefit in how she feels with using therapy. She uses therapy for about 5 hours. Once she wakes to urinate at night she removes therapy and does not resume.   Compliance report dated 08/10/2021-09/08/2021 shows she used CPAP 30/30 days. She used therapy greater than 4 hours 27/30 days (90%). Average usage was 5hr . Residual AHI 5.8/hr on 7-17cmH20. Large leak noted in 95th percentile of 30.9 l/min.   05/20/2020 ALL: Darlene [name] is a 82 y.o. female here today for follow up for OSA on CPAP.  She is doing well on therapy. She is not sure that she feels any better but recognizes need for therapy. She switched to a nasal pillow but feels it is causing tenderness of her nares. She would like to try a nasal mask. Otherwise, doing very well.      HISTORY: (copied from Dr Dohmeier's previous note)  Rv 11-14-2019-   Darlene [name] is a 82 year old white female patient seen here for a revisit  on  11/14/2019 ; I had ordered sleep studies after her last visit here with me which was actually her first and the patient presented on [id number] in lab sleep study which had been ordered requested by her primary care physician as well as by Dr. Purvis Sheffield her cardiologist at the time.  Patient has known atrial fibrillation congestive due to diastolic heart failure.  Apnea hypopnea index and that study was 11/h REM AHI was 19.9 non-REM AHI was 8.8 and in supine position while sleeping on her back, her AHI was 17.2 overall this was mild apnea but with the lack of oxygen desaturation time.  And in addition it appeared to be a classic apnea slightly more central events and obstructive.  For this reason we did not want to autotitrator also to return for CPAP titration.  That took place on 07-11-19 and here now the patient was put first exposed to CPAP but it did not work CPAP titration left her with a residual apnea index of 10.4 for that reason she had to be titrated to BiPAP CPAP highest pressure tried was 16 cmH2O.   On the BiPAP of 15 under 19 there was no reduction of the apnea either she still had 30 apneas per hour.  The technician gave her a Mirage Quatro mask and medium full facemask.  She remained was hypoxic  oxygen saturation for much of the study diagnosis was severe central sleep apnea now treatment emerging failure to alleviate on the CPAP or BiPAP.   Foot attempts to control her apnea at this time she was titrated to ASV which she responded to beautifully.  A very first sleep study had documented an AHI of 53.7 on May 24, 2019 her ASV study allowed an AHI of 0.0 apneas per hour on the a pressure of 10 cmH2O.   I have now seen her download- she has been placed on auto CPAP and is doing well, we will change the mask form a FFM ( ? Why was she given a FFM?) to the desired nasal mask. She would like an ESON medium mask.    REVIEW OF SYSTEMS: Out of a complete 14 system review of symptoms, the patient  complains only of the following symptoms, fatigue and all other reviewed systems are negative.  ESS 13/24 FSS: 63   ALLERGIES: Allergies  Allergen Reactions   Cephalosporins Rash    HOME MEDICATIONS: Outpatient Medications Prior to Visit  Medication Sig Dispense Refill   acetaminophen (TYLENOL) 500 MG tablet Take 500 mg by mouth every 6 (six) hours as needed.     albuterol (VENTOLIN HFA) 108 (90 Base) MCG/ACT inhaler Inhale 2 puffs into the lungs every 6 (six) hours as needed for wheezing or shortness of breath. 8 g 2   apixaban (ELIQUIS) 5 MG TABS tablet TAKE 1 TABLET BY MOUTH TWICE  DAILY 200 tablet 1   Ascorbic Acid (VITAMIN C) 500 MG CAPS Take 500 mg by mouth.      busPIRone (BUSPAR) 10 MG tablet TAKE 1/2 TO 1 TABLET BY MOUTH 3  TIMES DAILY AS NEEDED FOR  ANXIETY, MOOD OR IRRITABILITY 90 tablet 0   CHOLECALCIFEROL PO Take 5,000 Units by mouth daily.     Cyanocobalamin 2500 MCG TABS Take 2,500 mcg by mouth daily.     cyclobenzaprine (FLEXERIL) 10 MG tablet Take 1/2 to 1 tablet    2 to 3 x /day if needed for Muscle Spasms (Patient taking differently: Take 5-10 mg by mouth 3 (three) times daily as needed for muscle spasms. Take 1/2 to 1 tablet    2 to 3 x /day if needed for Muscle Spasms) 90 tablet 0   diltiazem (CARDIZEM CD) 300 MG 24 hr capsule TAKE 1 CAPSULE BY MOUTH DAILY 100 capsule 2   escitalopram (LEXAPRO) 20 MG tablet TAKE 1 TABLET BY MOUTH DAILY FOR MOOD, ANXIETY &amp; IRRITABILITY 90 tablet 3   ferrous sulfate 325 (65 FE) MG tablet Take 1 tablet (325 mg total) by mouth every other day.     ipratropium-albuterol (DUONEB) 0.5-2.5 (3) MG/3ML SOLN USE 1 VIAL IN NEBULIZER EVERY 4 HOURS (Patient taking differently: Take 3 mLs by nebulization every 4 (four) hours as needed (SOB/Wheezing).) 180 mL 11   lansoprazole (PREVACID) 30 MG capsule Take  1 capsule Daily to Prevent Heartburn & Indigestion                                               /  TAKE                                              BY                                                         MOUTH 90 capsule 3   Magnesium 250 MG TABS Take 1 tablet by mouth daily.     metoprolol tartrate (LOPRESSOR) 25 MG tablet TAKE 2 TABLETS BY MOUTH IN THE  MORNING AND 1 TABLET BY MOUTH IN THE EVENING (Patient taking differently: Take 25-50 mg by mouth 2 (two) times daily. TAKE 2 TABLETS BY MOUTH IN THE  MORNING AND 1 TABLET BY MOUTH IN THE EVENING) 300 tablet 3   nitroGLYCERIN (NITROSTAT) 0.4 MG SL tablet Place 1 tablet (0.4 mg total) under the tongue every 5 (five) minutes as needed for chest pain. 25 tablet 3   Probiotic Product (PROBIOTIC DAILY PO) Take 1 tablet by mouth daily.     psyllium (METAMUCIL) 58.6 % packet Take 1 packet by mouth daily.     zinc gluconate 50 MG tablet Take 50 mg by mouth daily.     No facility-administered medications prior to visit.    PAST MEDICAL HISTORY: Past Medical History:  Diagnosis Date   Anxiety    Chronic lower back pain    Depression    DJD (degenerative joint disease)    knees, neck   GERD (gastroesophageal reflux disease)    has had dilitation in the past   Hypertension     PAST SURGICAL HISTORY: Past Surgical History:  Procedure Laterality Date   ABDOMINAL HYSTERECTOMY  03/1979   BREAST BIOPSY Left 1986 and 1987   CATARACT EXTRACTION, BILATERAL Bilateral 2014   Dr. Leonette Monarch, in [place]    FAMILY HISTORY: Family History  Problem Relation Age of Onset   Hypertension Mother    Heart disease Mother    Hypertension Father    Heart disease Father    Tremor Sister    Dementia Brother    Hypertension Daughter    Colon cancer Neg Hx    Esophageal cancer Neg Hx    Rectal cancer Neg Hx    Stomach cancer Neg Hx     SOCIAL HISTORY: Social History   Socioeconomic History   Marital status: Widowed    Spouse name: Not on file   Number of children: 2   Years of education: Not on file   Highest education level: 12th  grade  Occupational History   Occupation: retired    Comment: worked in Designer, fashion/clothing  Tobacco Use   Smoking status: Never   Smokeless tobacco: Never  Vaping Use   Vaping status: Never Used  Substance and Sexual Activity   Alcohol use: Yes    Alcohol/week: 3.0 standard drinks of alcohol    Types: 3 Standard drinks or equivalent per week    Comment: 3 nights out of the week   Drug use: No   Sexual activity: Not Currently  Other Topics Concern   Not on file  Social History Narrative   Not on file   Social Determinants of Health   Financial Resource Strain: Low Risk  (  02/03/2021)   Overall Financial Resource Strain (CARDIA)    Difficulty of Paying Living Expenses: Not very hard  Food Insecurity: No Food Insecurity (07/17/2021)   Hunger Vital Sign    Worried About Running Out of Food in the Last Year: Never true    Ran Out of Food in the Last Year: Never true  Transportation Needs: No Transportation Needs (07/17/2021)   PRAPARE - Administrator, Civil Service (Medical): No    Lack of Transportation (Non-Medical): No  Physical Activity: Inactive (04/28/2019)   Exercise Vital Sign    Days of Exercise per Week: 0 days    Minutes of Exercise per Session: 0 min  Stress: No Stress Concern Present (04/28/2019)   [employer] of Occupational Health - Occupational Stress Questionnaire    Feeling of Stress : Only a little  Social Connections: Moderately Integrated (04/28/2019)   Social Connection and Isolation Panel [NHANES]    Frequency of Communication with Friends and Family: More than three times a week    Frequency of Social Gatherings with Friends and Family: More than three times a week    Attends Religious Services: 1 to 4 times per year    Active Member of [employer] or Organizations: Yes    Attends Banker Meetings: More than 4 times per year    Marital Status: Widowed  Intimate Partner Violence: Not At Risk (04/28/2019)   Humiliation, Afraid, Rape, and Kick  questionnaire    Fear of Current or Ex-Partner: No    Emotionally Abused: No    Physically Abused: No    Sexually Abused: No     PHYSICAL EXAM  There were no vitals filed for this visit.    There is no height or weight on file to calculate BMI.  Generalized: Well developed, in no acute distress  Cardiology: normal rate and irregularly irregular rhythm, no murmur noted Respiratory: clear to auscultation bilaterally  Neurological examination  Mentation: Alert oriented to time, place, history taking. Follows all commands speech and language fluent Cranial nerve II-XII: Pupils were equal round reactive to light. Extraocular movements were full, visual field were full  Motor: The motor testing reveals 5 over 5 strength of all 4 extremities. Good symmetric motor tone is noted throughout.  Gait and station: Gait is arthritic but stable with cane     DIAGNOSTIC DATA (LABS, IMAGING, TESTING) - I reviewed patient records, labs, notes, testing and imaging myself where available.      No data to display           Lab Results  Component Value Date   WBC 5.8 02/16/2023   HGB 13.0 02/16/2023   HCT 39.3 02/16/2023   MCV 98.0 02/16/2023   PLT 188 02/16/2023      Component Value Date/Time   NA 142 02/16/2023 1045   K 4.4 02/16/2023 1045   CL 105 02/16/2023 1045   CO2 30 02/16/2023 1045   GLUCOSE 89 02/16/2023 1045   BUN 17 02/16/2023 1045   CREATININE 0.72 02/16/2023 1045   CALCIUM 9.3 02/16/2023 1045   PROT 6.0 (L) 02/16/2023 1045   ALBUMIN 3.7 08/31/2022 1055   AST 15 02/16/2023 1045   ALT 12 02/16/2023 1045   ALKPHOS 55 08/31/2022 1055   BILITOT 1.1 02/16/2023 1045   GFRNONAA >60 08/31/2022 1055   GFRNONAA 75 04/03/2020 1030   GFRAA 87 04/03/2020 1030   Lab Results  Component Value Date   CHOL 191 02/16/2023   HDL 70  02/16/2023   LDLCALC 105 (H) 02/16/2023   TRIG 70 02/16/2023   CHOLHDL 2.7 02/16/2023   Lab Results  Component Value Date   HGBA1C 5.6  02/16/2023   Lab Results  Component Value Date   VITAMINB12 1,326 (H) 02/16/2023   Lab Results  Component Value Date   TSH 1.47 02/16/2023     ASSESSMENT AND PLAN 82 y.o. year old female  has a past medical history of Anxiety, Chronic lower back pain, Depression, DJD (degenerative joint disease), GERD (gastroesophageal reflux disease), and Hypertension. here with   No diagnosis found.    Darlene [name] is doing well. Compliance report reveals excellent compliance. She will continue to monitor for leak at home. She was encouraged to continue using CPAP nightly and for greater than 4 hours each night. We will update supply orders as indicated. Risks of untreated sleep apnea review and education materials provided. Healthy lifestyle habits encouraged. She will follow up in 1 year, sooner if needed. She verbalizes understanding and agreement with this plan.    No orders of the defined types were placed in this encounter.    No orders of the defined types were placed in this encounter.       Shawnie Dapper, FNP-C 02/17/2023, 12:53 PM [facility] [address], [address] [place], [place] [address] [phone]

## 2023-02-17 NOTE — Patient Instructions (Incomplete)

## 2023-02-18 ENCOUNTER — Ambulatory Visit: Payer: Medicare Other | Admitting: Family Medicine

## 2023-02-18 DIAGNOSIS — G4733 Obstructive sleep apnea (adult) (pediatric): Secondary | ICD-10-CM

## 2023-02-22 NOTE — Progress Notes (Unsigned)
PATIENT: Darlene [name] DOB: 1940-08-19  REASON FOR VISIT: follow up HISTORY FROM: patient  No chief complaint on file.    HISTORY OF PRESENT ILLNESS:  02/22/23 ALL:  Darlene [name] returns for follow up for OSA on CPAP.     02/17/2022 ALL:  Darlene [name] returns for follow up for OSA on CPAP. She is doing well on therapy. She is using CPAP nightly for about 6-7 hours. She does feel improved sleep quality and wakes refreshed. ESS and FSS elevated but she does not feel sleepiness limits functioning. She rests when she can. She feels atrial fib and med side effects cause her to feel so tired.     09/10/2021 ALL: Darlene [name] returns for follow up for OSA on CPAP. She continues to struggle with finding a mask that works well for her. She has tried two different nasal pillow masks and a traditional FFM. She has leaks with most all or has difficulty with dry mouth. She used a nasal pillow that seemed to work best but caused skin irritation to nares. She has not tried a barrier cream. She has not noted any significant benefit in how she feels with using therapy. She uses therapy for about 5 hours. Once she wakes to urinate at night she removes therapy and does not resume.   Compliance report dated 08/10/2021-09/08/2021 shows she used CPAP 30/30 days. She used therapy greater than 4 hours 27/30 days (90%). Average usage was 5hr . Residual AHI 5.8/hr on 7-17cmH20. Large leak noted in 95th percentile of 30.9 l/min.   05/20/2020 ALL: Darlene [name] is a 82 y.o. female here today for follow up for OSA on CPAP.  She is doing well on therapy. She is not sure that she feels any better but recognizes need for therapy. She switched to a nasal pillow but feels it is causing tenderness of her nares. She would like to try a nasal mask. Otherwise, doing very well.      HISTORY: (copied from Dr Dohmeier's previous note)  Rv 11-14-2019-   Darlene [name] is a 82 year old white female patient seen here for a revisit  on  11/14/2019 ; I had ordered sleep studies after her last visit here with me which was actually her first and the patient presented on [id number] in lab sleep study which had been ordered requested by her primary care physician as well as by Dr. Purvis Sheffield her cardiologist at the time.  Patient has known atrial fibrillation congestive due to diastolic heart failure.  Apnea hypopnea index and that study was 11/h REM AHI was 19.9 non-REM AHI was 8.8 and in supine position while sleeping on her back, her AHI was 17.2 overall this was mild apnea but with the lack of oxygen desaturation time.  And in addition it appeared to be a classic apnea slightly more central events and obstructive.  For this reason we did not want to autotitrator also to return for CPAP titration.  That took place on 07-11-19 and here now the patient was put first exposed to CPAP but it did not work CPAP titration left her with a residual apnea index of 10.4 for that reason she had to be titrated to BiPAP CPAP highest pressure tried was 16 cmH2O.   On the BiPAP of 15 under 19 there was no reduction of the apnea either she still had 30 apneas per hour.  The technician gave her a Mirage Quatro mask and medium full facemask.  She remained was hypoxic  oxygen saturation for much of the study diagnosis was severe central sleep apnea now treatment emerging failure to alleviate on the CPAP or BiPAP.   Foot attempts to control her apnea at this time she was titrated to ASV which she responded to beautifully.  A very first sleep study had documented an AHI of 53.7 on May 24, 2019 her ASV study allowed an AHI of 0.0 apneas per hour on the a pressure of 10 cmH2O.   I have now seen her download- she has been placed on auto CPAP and is doing well, we will change the mask form a FFM ( ? Why was she given a FFM?) to the desired nasal mask. She would like an ESON medium mask.    REVIEW OF SYSTEMS: Out of a complete 14 system review of symptoms, the patient  complains only of the following symptoms, fatigue and all other reviewed systems are negative.  ESS 13/24 FSS: 63   ALLERGIES: Allergies  Allergen Reactions   Cephalosporins Rash    HOME MEDICATIONS: Outpatient Medications Prior to Visit  Medication Sig Dispense Refill   acetaminophen (TYLENOL) 500 MG tablet Take 500 mg by mouth every 6 (six) hours as needed.     albuterol (VENTOLIN HFA) 108 (90 Base) MCG/ACT inhaler Inhale 2 puffs into the lungs every 6 (six) hours as needed for wheezing or shortness of breath. 8 g 2   apixaban (ELIQUIS) 5 MG TABS tablet TAKE 1 TABLET BY MOUTH TWICE  DAILY 200 tablet 1   Ascorbic Acid (VITAMIN C) 500 MG CAPS Take 500 mg by mouth.      busPIRone (BUSPAR) 10 MG tablet TAKE 1/2 TO 1 TABLET BY MOUTH 3  TIMES DAILY AS NEEDED FOR  ANXIETY, MOOD OR IRRITABILITY 90 tablet 0   CHOLECALCIFEROL PO Take 5,000 Units by mouth daily.     Cyanocobalamin 2500 MCG TABS Take 2,500 mcg by mouth daily.     cyclobenzaprine (FLEXERIL) 10 MG tablet Take 1/2 to 1 tablet    2 to 3 x /day if needed for Muscle Spasms (Patient taking differently: Take 5-10 mg by mouth 3 (three) times daily as needed for muscle spasms. Take 1/2 to 1 tablet    2 to 3 x /day if needed for Muscle Spasms) 90 tablet 0   diltiazem (CARDIZEM CD) 300 MG 24 hr capsule TAKE 1 CAPSULE BY MOUTH DAILY 100 capsule 2   escitalopram (LEXAPRO) 20 MG tablet TAKE 1 TABLET BY MOUTH DAILY FOR MOOD, ANXIETY &amp; IRRITABILITY 90 tablet 3   ferrous sulfate 325 (65 FE) MG tablet Take 1 tablet (325 mg total) by mouth every other day.     ipratropium-albuterol (DUONEB) 0.5-2.5 (3) MG/3ML SOLN USE 1 VIAL IN NEBULIZER EVERY 4 HOURS (Patient taking differently: Take 3 mLs by nebulization every 4 (four) hours as needed (SOB/Wheezing).) 180 mL 11   lansoprazole (PREVACID) 30 MG capsule Take  1 capsule Daily to Prevent Heartburn & Indigestion                                               /  TAKE                                              BY                                                         MOUTH 90 capsule 3   Magnesium 250 MG TABS Take 1 tablet by mouth daily.     metoprolol tartrate (LOPRESSOR) 25 MG tablet TAKE 2 TABLETS BY MOUTH IN THE  MORNING AND 1 TABLET BY MOUTH IN THE EVENING (Patient taking differently: Take 25-50 mg by mouth 2 (two) times daily. TAKE 2 TABLETS BY MOUTH IN THE  MORNING AND 1 TABLET BY MOUTH IN THE EVENING) 300 tablet 3   nitroGLYCERIN (NITROSTAT) 0.4 MG SL tablet Place 1 tablet (0.4 mg total) under the tongue every 5 (five) minutes as needed for chest pain. 25 tablet 3   Probiotic Product (PROBIOTIC DAILY PO) Take 1 tablet by mouth daily.     psyllium (METAMUCIL) 58.6 % packet Take 1 packet by mouth daily.     zinc gluconate 50 MG tablet Take 50 mg by mouth daily.     No facility-administered medications prior to visit.    PAST MEDICAL HISTORY: Past Medical History:  Diagnosis Date   Anxiety    Chronic lower back pain    Depression    DJD (degenerative joint disease)    knees, neck   GERD (gastroesophageal reflux disease)    has had dilitation in the past   Hypertension     PAST SURGICAL HISTORY: Past Surgical History:  Procedure Laterality Date   ABDOMINAL HYSTERECTOMY  03/1979   BREAST BIOPSY Left 1986 and 1987   CATARACT EXTRACTION, BILATERAL Bilateral 2014   Dr. Leonette Monarch, in [place]    FAMILY HISTORY: Family History  Problem Relation Age of Onset   Hypertension Mother    Heart disease Mother    Hypertension Father    Heart disease Father    Tremor Sister    Dementia Brother    Hypertension Daughter    Colon cancer Neg Hx    Esophageal cancer Neg Hx    Rectal cancer Neg Hx    Stomach cancer Neg Hx     SOCIAL HISTORY: Social History   Socioeconomic History   Marital status: Widowed    Spouse name: Not on file   Number of children: 2   Years of education: Not on file   Highest education level: 12th  grade  Occupational History   Occupation: retired    Comment: worked in Designer, fashion/clothing  Tobacco Use   Smoking status: Never   Smokeless tobacco: Never  Vaping Use   Vaping status: Never Used  Substance and Sexual Activity   Alcohol use: Yes    Alcohol/week: 3.0 standard drinks of alcohol    Types: 3 Standard drinks or equivalent per week    Comment: 3 nights out of the week   Drug use: No   Sexual activity: Not Currently  Other Topics Concern   Not on file  Social History Narrative   Not on file   Social Determinants of Health   Financial Resource Strain: Low Risk  (  02/03/2021)   Overall Financial Resource Strain (CARDIA)    Difficulty of Paying Living Expenses: Not very hard  Food Insecurity: No Food Insecurity (07/17/2021)   Hunger Vital Sign    Worried About Running Out of Food in the Last Year: Never true    Ran Out of Food in the Last Year: Never true  Transportation Needs: No Transportation Needs (07/17/2021)   PRAPARE - Administrator, Civil Service (Medical): No    Lack of Transportation (Non-Medical): No  Physical Activity: Inactive (04/28/2019)   Exercise Vital Sign    Days of Exercise per Week: 0 days    Minutes of Exercise per Session: 0 min  Stress: No Stress Concern Present (04/28/2019)   [employer] of Occupational Health - Occupational Stress Questionnaire    Feeling of Stress : Only a little  Social Connections: Moderately Integrated (04/28/2019)   Social Connection and Isolation Panel [NHANES]    Frequency of Communication with Friends and Family: More than three times a week    Frequency of Social Gatherings with Friends and Family: More than three times a week    Attends Religious Services: 1 to 4 times per year    Active Member of [employer] or Organizations: Yes    Attends Banker Meetings: More than 4 times per year    Marital Status: Widowed  Intimate Partner Violence: Not At Risk (04/28/2019)   Humiliation, Afraid, Rape, and Kick  questionnaire    Fear of Current or Ex-Partner: No    Emotionally Abused: No    Physically Abused: No    Sexually Abused: No     PHYSICAL EXAM  There were no vitals filed for this visit.    There is no height or weight on file to calculate BMI.  Generalized: Well developed, in no acute distress  Cardiology: normal rate and irregularly irregular rhythm, no murmur noted Respiratory: clear to auscultation bilaterally  Neurological examination  Mentation: Alert oriented to time, place, history taking. Follows all commands speech and language fluent Cranial nerve II-XII: Pupils were equal round reactive to light. Extraocular movements were full, visual field were full  Motor: The motor testing reveals 5 over 5 strength of all 4 extremities. Good symmetric motor tone is noted throughout.  Gait and station: Gait is arthritic but stable with cane     DIAGNOSTIC DATA (LABS, IMAGING, TESTING) - I reviewed patient records, labs, notes, testing and imaging myself where available.      No data to display           Lab Results  Component Value Date   WBC 5.8 02/16/2023   HGB 13.0 02/16/2023   HCT 39.3 02/16/2023   MCV 98.0 02/16/2023   PLT 188 02/16/2023      Component Value Date/Time   NA 142 02/16/2023 1045   K 4.4 02/16/2023 1045   CL 105 02/16/2023 1045   CO2 30 02/16/2023 1045   GLUCOSE 89 02/16/2023 1045   BUN 17 02/16/2023 1045   CREATININE 0.72 02/16/2023 1045   CALCIUM 9.3 02/16/2023 1045   PROT 6.0 (L) 02/16/2023 1045   ALBUMIN 3.7 08/31/2022 1055   AST 15 02/16/2023 1045   ALT 12 02/16/2023 1045   ALKPHOS 55 08/31/2022 1055   BILITOT 1.1 02/16/2023 1045   GFRNONAA >60 08/31/2022 1055   GFRNONAA 75 04/03/2020 1030   GFRAA 87 04/03/2020 1030   Lab Results  Component Value Date   CHOL 191 02/16/2023   HDL 70  02/16/2023   LDLCALC 105 (H) 02/16/2023   TRIG 70 02/16/2023   CHOLHDL 2.7 02/16/2023   Lab Results  Component Value Date   HGBA1C 5.6  02/16/2023   Lab Results  Component Value Date   VITAMINB12 1,326 (H) 02/16/2023   Lab Results  Component Value Date   TSH 1.47 02/16/2023     ASSESSMENT AND PLAN 82 y.o. year old female  has a past medical history of Anxiety, Chronic lower back pain, Depression, DJD (degenerative joint disease), GERD (gastroesophageal reflux disease), and Hypertension. here with   No diagnosis found.  ALIVEA ARCHULETA is doing well. Compliance report reveals excellent compliance. She will continue to monitor for leak at home. She was encouraged to continue using CPAP nightly and for greater than 4 hours each night. We will update supply orders as indicated. Risks of untreated sleep apnea review and education materials provided. Healthy lifestyle habits encouraged. She will follow up in 1 year, sooner if needed. She verbalizes understanding and agreement with this plan.    No orders of the defined types were placed in this encounter.    No orders of the defined types were placed in this encounter.       Shawnie Dapper, FNP-C 02/22/2023, 4:16 PM [facility] [address] [place], [place] [address] [phone]

## 2023-02-22 NOTE — Patient Instructions (Incomplete)
Please continue using your CPAP regularly. While your insurance requires that you use CPAP at least 4 hours each night on 70% of the nights, I recommend, that you not skip any nights and use it throughout the night if you can. Getting used to CPAP and staying with the treatment long term does take time and patience and discipline. Untreated obstructive sleep apnea when it is moderate to severe can have an adverse impact on cardiovascular health and raise her risk for heart disease, arrhythmias, hypertension, congestive heart failure, stroke and diabetes. Untreated obstructive sleep apnea causes sleep disruption, nonrestorative sleep, and sleep deprivation. This can have an impact on your day to day functioning and cause daytime sleepiness and impairment of cognitive function, memory loss, mood disturbance, and problems focussing. Using CPAP regularly can improve these symptoms.  We will update supply orders, today. DME: [facility] Phone: [phone]  Follow up in 1 year

## 2023-02-23 ENCOUNTER — Ambulatory Visit: Payer: Medicare Other | Admitting: Family Medicine

## 2023-02-23 ENCOUNTER — Encounter: Payer: Self-pay | Admitting: Family Medicine

## 2023-02-23 VITALS — BP 100/58 | HR 70 | Ht 69.0 in | Wt 218.5 lb

## 2023-02-23 DIAGNOSIS — G4733 Obstructive sleep apnea (adult) (pediatric): Secondary | ICD-10-CM | POA: Diagnosis not present

## 2023-03-03 ENCOUNTER — Other Ambulatory Visit: Payer: Self-pay | Admitting: Internal Medicine

## 2023-03-03 MED ORDER — PANTOPRAZOLE SODIUM 40 MG PO TBEC: DELAYED_RELEASE_TABLET | ORAL | 3 refills | Status: DC

## 2023-03-04 ENCOUNTER — Encounter: Payer: Self-pay | Admitting: Pediatrics

## 2023-03-19 DIAGNOSIS — H43813 Vitreous degeneration, bilateral: Secondary | ICD-10-CM | POA: Diagnosis not present

## 2023-03-19 DIAGNOSIS — H353132 Nonexudative age-related macular degeneration, bilateral, intermediate dry stage: Secondary | ICD-10-CM | POA: Diagnosis not present

## 2023-04-14 DIAGNOSIS — H0102B Squamous blepharitis left eye, upper and lower eyelids: Secondary | ICD-10-CM | POA: Diagnosis not present

## 2023-04-14 DIAGNOSIS — H353132 Nonexudative age-related macular degeneration, bilateral, intermediate dry stage: Secondary | ICD-10-CM | POA: Diagnosis not present

## 2023-04-14 DIAGNOSIS — H0102A Squamous blepharitis right eye, upper and lower eyelids: Secondary | ICD-10-CM | POA: Diagnosis not present

## 2023-05-04 ENCOUNTER — Other Ambulatory Visit: Payer: Self-pay | Admitting: Cardiology

## 2023-05-13 ENCOUNTER — Telehealth: Payer: Self-pay | Admitting: Pharmacy Technician

## 2023-05-13 ENCOUNTER — Other Ambulatory Visit: Payer: Self-pay | Admitting: Family Medicine

## 2023-05-13 DIAGNOSIS — Z5986 Financial insecurity: Secondary | ICD-10-CM

## 2023-05-13 DIAGNOSIS — Z0000 Encounter for general adult medical examination without abnormal findings: Secondary | ICD-10-CM

## 2023-05-13 NOTE — Progress Notes (Signed)
 Pharmacy Medication Assistance Program Note    05/13/2023  Patient ID: ELENORE WANNINGER, female   DOB: [birth year], 83 y.o.   MRN: [id number]     02/02/2023 05/13/2023  Outreach Medication One  Initial Outreach Date (Medication One) 02/02/2023   Manufacturer Medication One [employer]   [facility] Eliquis   Dose of Eliquis 5mg    Type of Radiographer, therapeutic Assistance   Date Application Sent to Patient 02/02/2023   Application Items Requested Application;Proof of Income;Proof of Out of Pocket Spend;Other   Date Application Sent to Prescriber 03/18/2023   Name of Prescriber Dina Rich   Date Application Received From Patient  04/29/2023  Application Items Received From Patient  Proof of Income;Application;Proof of Out of Pocket Spend;Other  Date Application Received From Provider  04/30/2023  Date Application Submitted to Manufacturer  05/12/2023  Method Application Sent to Manufacturer  Fax    Pattricia Boss, CPhT   Office: [phone] Fax: [phone] Email: [email or web address]

## 2023-05-14 ENCOUNTER — Telehealth: Payer: Self-pay | Admitting: Pharmacy Technician

## 2023-05-14 DIAGNOSIS — Z5986 Financial insecurity: Secondary | ICD-10-CM

## 2023-05-14 NOTE — Progress Notes (Addendum)
 Pharmacy Medication Assistance Program Note    05/14/2023  Patient ID: Darlene [name], female   DOB: 02/17/1941, 83 y.o.   MRN: [id number]   Care coordination call placed to [employer] in regard to Eliquis application. Spoke to [place] who informs the income verification pulled an income greater than the income submitted and written on the application. She informs she would have to speak to patient to dispute the income. Successful outreach to patient. Spoke to patient's daughter Darlene [name]. HIPAA verified. [name] informed [employer]MS that the income was wrong. Lisette Abu informs she will make a note of it but would need patient to call in herself. Inquired if 2023 tax return would be beneficial and Kaylee informed to fax that to [employer]MS. Tax return faxed to [employer]MS today. Lisette Abu also informed the invoice that shows PAID IN FULL with the patient's OOP would NOT be accepted. She informs that as of 2025 [employer] would not accept invoices anymore and that it had to be a receipt. Darlene [name] will attempt to call [facility] and ask for a statement remittance receipt to fax into BMS. Darlene [name] will also have patient call in and dispute income with BMS. Will await return of documents.  ADDENDUM 05/20/23 Incoming call received from [place]. HIPAA verified. Darlene [name] and her mom called into [employer] and they informed the representative they spoke to said the [employer] would be acceptable to prove the 3% OOP requirement. She also informed she will drop the tax return off at the office as the representative informed that would be enough to prove patient's income.  Received the tax return and submitted to [employer]MS today.  Darlene [name], CPhT [place]  Office: [phone] Fax: [phone] Email: [email or web address]

## 2023-05-17 ENCOUNTER — Ambulatory Visit: Payer: Medicare Other | Admitting: Nurse Practitioner

## 2023-05-18 NOTE — Progress Notes (Unsigned)
  Gastroenterology Initial Consultation   Referring Provider Darlene Cowboy, MD [address] [place],  [place] [address]  Primary Care Provider Darlene Cowboy, MD  Patient Profile: Darlene [name] is a 83 y.o. female who is seen in consultation in the [facility] Gastroenterology at the request of Dr. Oneta [name] for evaluation and management of the problem(s) noted below.  Problem List: GERD Dysphagia, history of esophageal strictures requiring dilation Hiatal hernia Personal history of colon polyps-tubular adenomas and sessile serrated polyps   History of Present Illness   Darlene [name] is a 83 y.o. female with a history of A-fib with RVR on Eliquis, pulmonary hypertension, central sleep apnea, morbid obesity, HLD who was referred to the gastroenterology office for evaluation and management of GERD, dysphagia and history of hiatal hernia   Last colonoscopy: 05/2017 - 2 polyps (SSP, TA), IH Last endoscopy: 05/2017 - moderate stenosis of the GE junction savory dilated to 15 mm, medium HH  Last Abd CT/CTE/MRE: None   GI Review of Symptoms Significant for {GIROS:50592}. Otherwise negative.  General Review of Systems  Review of systems is significant for the pertinent positives and negatives as listed per the HPI.  Full ROS is otherwise negative.  Past Medical History   Past Medical History:  Diagnosis Date   Anxiety    Chronic lower back pain    Depression    DJD (degenerative joint disease)    knees, neck   GERD (gastroesophageal reflux disease)    has had dilitation in the past   Hypertension      Past Surgical History   Past Surgical History:  Procedure Laterality Date   ABDOMINAL HYSTERECTOMY  03/1979   BREAST BIOPSY Left 1986 and 1987   CATARACT EXTRACTION, BILATERAL Bilateral 2014   Dr. Leonette [name], in [place]     Allergies and Medications   Allergies  Allergen Reactions   Cephalosporins Rash    @MEDSTODAY @  Family History    Family History  Problem Relation Age of Onset   Hypertension Mother    Heart disease Mother    Hypertension Father    Heart disease Father    Tremor Sister    Dementia Brother    Hypertension Daughter    Colon cancer Neg Hx    Esophageal cancer Neg Hx    Rectal cancer Neg Hx    Stomach cancer Neg Hx      Social History   Social History   Tobacco Use   Smoking status: Never   Smokeless tobacco: Never  Vaping Use   Vaping status: Never Used  Substance Use Topics   Alcohol use: Yes    Alcohol/week: 3.0 standard drinks of alcohol    Types: 3 Standard drinks or equivalent per week    Comment: 3 nights out of the week   Drug use: No   Darlene [name] reports that she has never smoked. She has never used smokeless tobacco. She reports current alcohol use of about 3.0 standard drinks of alcohol per week. She reports that she does not use drugs.  Vital Signs and Physical Examination  There were no vitals filed for this visit. There is no height or weight on file to calculate BMI.    General: Well developed, well nourished, no acute distress Head: Normocephalic and atraumatic Eyes: Sclerae anicteric, EOMI Ears: Normal auditory acuity Mouth: No deformities or lesions noted Lungs: Clear throughout to auscultation Heart: Regular rate and rhythm; No murmurs, rubs or bruits Abdomen: Soft, non tender and non distended.  No masses, hepatosplenomegaly or hernias noted. Normal Bowel sounds Rectal: Musculoskeletal: Symmetrical with no gross deformities  Pulses:  Normal pulses noted Extremities: No edema or deformities noted Neurological: Alert oriented x 4, grossly nonfocal Psychological:  Alert and cooperative. Normal mood and affect  Review of Data  The following data was reviewed at the time of this encounter:  Laboratory Studies      Latest Ref Rng & Units 02/16/2023   10:45 AM 12/01/2022   12:23 PM 08/31/2022   10:55 AM  CBC  WBC 3.8 - 10.8 Thousand/uL 5.8  6.5  8.0    Hemoglobin 11.7 - 15.5 g/dL 16.1  09.6  04.5   Hematocrit 35.0 - 45.0 % 39.3  41.2  40.0   Platelets 140 - 400 Thousand/uL 188  192  190     Lab Results  Component Value Date   LIPASE 15 04/15/2019      Latest Ref Rng & Units 02/16/2023   10:45 AM 12/01/2022   12:23 PM 08/31/2022   10:55 AM  CMP  Glucose 65 - 99 mg/dL 89  87  409   BUN 7 - 25 mg/dL 17  21  15    Creatinine 0.60 - 0.95 mg/dL 8.11  9.14  7.82   Sodium 135 - 146 mmol/L 142  141  139   Potassium 3.5 - 5.3 mmol/L 4.4  4.2  3.8   Chloride 98 - 110 mmol/L 105  103  103   CO2 20 - 32 mmol/L 30  29  27    Calcium 8.6 - 10.4 mg/dL 9.3  9.7  9.3   Total Protein 6.1 - 8.1 g/dL 6.0  6.1  6.0   Total Bilirubin 0.2 - 1.2 mg/dL 1.1  1.0  1.2   Alkaline Phos 38 - 126 U/L   55   AST 10 - 35 U/L 15  15  19    ALT 6 - 29 U/L 12  11  14       Imaging Studies    GI Procedures and Studies  EGD/Colonoscopy 05/2017 EGD -moderate stenosis of the GE junction savory dilated to 15 mm, medium HH Colonoscopy - 2 polyps (SSP, TA), IH  EGD 09/2014 Tortuous esophagus balloon dilated to 18 mm, a few nonbleeding erosions in the gastric fundus, 3 cm hiatal hernia  Colonoscopy 10/2010 Performed with fentanyl and Versed-incomplete due to pain  Clinical Impression  It is my clinical impression that Darlene [name] is a 83 y.o. female with;  GERD Dysphagia, history of esophageal strictures requiring dilation Hiatal hernia Personal history of colon polyps-tubular adenomas and sessile serrated polyps  Plan  *** *** *** *** ***  Planned Follow Up No follow-ups on file.  The patient or caregiver verbalized understanding of the material covered, with no barriers to understanding. All questions were answered. Patient or caregiver is agreeable with the plan outlined above.    It was a pleasure to see Darlene [name].  If you have any questions or concerns regarding this evaluation, do not hesitate to contact me.  Maren Beach, MD [facility]  Gastroenterology

## 2023-05-19 ENCOUNTER — Ambulatory Visit: Payer: Medicare Other | Admitting: Pediatrics

## 2023-05-19 ENCOUNTER — Telehealth: Payer: Self-pay | Admitting: *Deleted

## 2023-05-19 ENCOUNTER — Encounter: Payer: Self-pay | Admitting: Pediatrics

## 2023-05-19 VITALS — BP 118/62 | HR 71 | Ht 70.0 in | Wt 227.0 lb

## 2023-05-19 DIAGNOSIS — K449 Diaphragmatic hernia without obstruction or gangrene: Secondary | ICD-10-CM

## 2023-05-19 DIAGNOSIS — K2100 Gastro-esophageal reflux disease with esophagitis, without bleeding: Secondary | ICD-10-CM

## 2023-05-19 DIAGNOSIS — R1310 Dysphagia, unspecified: Secondary | ICD-10-CM | POA: Diagnosis not present

## 2023-05-19 DIAGNOSIS — R1031 Right lower quadrant pain: Secondary | ICD-10-CM

## 2023-05-19 DIAGNOSIS — K5900 Constipation, unspecified: Secondary | ICD-10-CM

## 2023-05-19 DIAGNOSIS — R1903 Right lower quadrant abdominal swelling, mass and lump: Secondary | ICD-10-CM

## 2023-05-19 NOTE — Telephone Encounter (Signed)
 [facility] Pre-operative Risk Assessment     Request for surgical clearance:     Endoscopy Procedure  What type of surgery is being performed?  Endoscopy    When is this surgery scheduled?     06/30/2023   What type of clearance is required ?   Pharmacy  Are there any medications that need to be held prior to surgery and how long? Eliquis 2 days  Practice name and name of physician performing surgery?      [facility]  What is your office phone and fax number?      Phone- [phone]  Fax- [phone]  Anesthesia type (None, local, MAC, general) ?       MAC   Please route your response to Zella Ball [phone]

## 2023-05-19 NOTE — Patient Instructions (Signed)
 You have been scheduled for an endoscopy. Please follow written instructions given to you at your visit today.  [facility] has implemented a new process for scheduling procedures.  Please note your arrival time for the [facility] is the appointment time that is shown on your written instructions.  Please do not arrive one hour prior to the time listed in your instructions.  Please ignore any outside notifications to arrive one hour early.  We apologize for any confusion and look forward to seeing you for your procedure.   If you use inhalers (even only as needed), please bring them with you on the day of your procedure.  If you take any of the following medications, they will need to be adjusted prior to your procedure:   DO NOT TAKE 7 DAYS PRIOR TO TEST- Trulicity (dulaglutide) Ozempic, Wegovy (semaglutide) Mounjaro (tirzepatide) Bydureon Bcise (exanatide extended release)  DO NOT TAKE 1 DAY PRIOR TO YOUR TEST Rybelsus (semaglutide) Adlyxin (lixisenatide) Victoza (liraglutide) Byetta (exanatide) ___________________________________________________________________________    Due to recent changes in healthcare laws, you may see the results of your imaging and laboratory studies on MyChart before your provider has had a chance to review them.  We understand that in some cases there may be results that are confusing or concerning to you. Not all laboratory results come back in the same time frame and the provider may be waiting for multiple results in order to interpret others.  Please give [place] 48 hours in order for your provider to thoroughly review all the results before contacting the office for clarification of your results.    You will be contacted by our office prior to your procedure for directions on holding your ELIQUIS.  If you do not hear from our office 1 week prior to your scheduled procedure, please call [phone] to discuss.   I appreciate the  opportunity to  care for you  Thank You   Cyndi Lennert

## 2023-05-21 ENCOUNTER — Telehealth: Payer: Self-pay | Admitting: Pharmacy Technician

## 2023-05-21 DIAGNOSIS — Z5986 Financial insecurity: Secondary | ICD-10-CM

## 2023-05-21 NOTE — Progress Notes (Addendum)
   05/21/2023  Patient ID: Robby Sermon, female   DOB: [birth year], 83 y.o.   MRN: [id number]  Care coordination call placed to BMS in regard to Eliquis application. Spoke to [place] who informs they received the updated income documents and patient still needs to spend an additional $236.65 to qualify.  She informs the income is based off of adding the social security benefits to the adjusted gross income and then subtracting out the taxable social security income from a 1040. She informs they did apply the OOP of $471 to the 3% and the amount still needed is $236.65. Okey Regal informs this amount may go down and to check back next week as they are finalizing details about the OOP.  Will follow up with BMS and patient next week after receiving a definite answer from [employer]MS.  ADDENDUM 05/27/2023 Care coordination call placed to BMS. Spoke to [place] who informs the redetermination is still in process and could take up to 7 business days. He suggests calling back next week to check on application status.  ADDENDUM 06/01/2023 Care coordination call placed to BMS. Spoke to [place] who informs the redetermination has not been completed but she would ask that the case be expedited. She suggest calling back at the end of the week.  Pattricia Boss, CPhT [place]  Office: [phone] Fax: [phone] Email: [email or web address]   Pattricia Boss, CPhT [place]  Office: [phone] Fax: [phone] Email: [email or web address]

## 2023-05-24 NOTE — Telephone Encounter (Signed)
 Patient with diagnosis of afib on Eliquis for anticoagulation.    Procedure: Endoscopy Date of procedure: 06/30/23   CHA2DS2-VASc Score = 3   This indicates a 3.2% annual risk of stroke. The patient's score is based upon: CHF History: 0 HTN History: 0 Diabetes History: 0 Stroke History: 0 Vascular Disease History: 0 Age Score: 2 Gender Score: 1   CrCl 96 mL/min Platelet count 188 K  Per office protocol, patient can hold Eliquis for 2 days prior to procedure.     **This guidance is not considered finalized until pre-operative APP has relayed final recommendations.**

## 2023-05-25 ENCOUNTER — Encounter: Admitting: Pediatrics

## 2023-05-26 ENCOUNTER — Other Ambulatory Visit: Payer: Self-pay | Admitting: *Deleted

## 2023-05-26 DIAGNOSIS — R1903 Right lower quadrant abdominal swelling, mass and lump: Secondary | ICD-10-CM

## 2023-05-26 DIAGNOSIS — R1031 Right lower quadrant pain: Secondary | ICD-10-CM

## 2023-05-26 NOTE — Telephone Encounter (Signed)
   Patient Name: Darlene [name]  DOB: Sep 19, 1940 MRN: [id number]  Primary Cardiologist: Dina Rich, MD  Clinical pharmacists have reviewed the patient's past medical history, labs, and current medications as part of preoperative protocol coverage. The following recommendations have been made:  Per office protocol, patient can hold Eliquis for 2 days prior to procedure.    I will route this recommendation to the requesting party via Epic fax function and remove from pre-op pool.  Please call with questions.  Denyce Robert, NP 05/26/2023, 8:12 AM

## 2023-05-28 ENCOUNTER — Ambulatory Visit
Admission: RE | Admit: 2023-05-28 | Discharge: 2023-05-28 | Disposition: A | Discharge status: Home / Self Care | Payer: Medicare Other | Source: Ambulatory Visit | Attending: Family Medicine | Admitting: Family Medicine

## 2023-05-28 DIAGNOSIS — Z0000 Encounter for general adult medical examination without abnormal findings: Secondary | ICD-10-CM

## 2023-05-31 ENCOUNTER — Ambulatory Visit (HOSPITAL_COMMUNITY)
Admission: RE | Admit: 2023-05-31 | Discharge: 2023-05-31 | Disposition: A | Discharge status: Home / Self Care | Source: Ambulatory Visit | Attending: Pediatrics | Admitting: Pediatrics

## 2023-05-31 DIAGNOSIS — R1903 Right lower quadrant abdominal swelling, mass and lump: Secondary | ICD-10-CM | POA: Diagnosis present

## 2023-05-31 DIAGNOSIS — R1031 Right lower quadrant pain: Secondary | ICD-10-CM | POA: Insufficient documentation

## 2023-05-31 MED ORDER — IOHEXOL 300 MG/ML  SOLN
100.0000 mL | Freq: Once | INTRAMUSCULAR | Status: AC | PRN
Start: 2023-05-31 — End: 2023-05-31
  Administered 2023-05-31: 100 mL via INTRAVENOUS

## 2023-05-31 MED ORDER — SODIUM CHLORIDE (PF) 0.9 % IJ SOLN
INTRAMUSCULAR | Status: AC
  Filled 2023-05-31: qty 50

## 2023-06-01 NOTE — Telephone Encounter (Signed)
 Patient and daughter informed that patient can stop taking Eliquis 2 days before her procedure So the 51 th and 15 th the patient will hold ELiquis. Answered all questions will call back as needed

## 2023-06-03 NOTE — Telephone Encounter (Signed)
   We received this. I have it scanned in media, thank you

## 2023-06-04 ENCOUNTER — Telehealth: Payer: Self-pay | Admitting: Pharmacy Technician

## 2023-06-04 DIAGNOSIS — Z5986 Financial insecurity: Secondary | ICD-10-CM

## 2023-06-04 NOTE — Progress Notes (Signed)
 Pharmacy Medication Assistance Program Note    06/04/2023  Patient ID: Darlene [name], female   DOB: 07-09-1940, 83 y.o.   MRN: [id number]     02/02/2023 05/13/2023 06/04/2023  Outreach Medication One  Initial Outreach Date (Medication One) 02/02/2023    Manufacturer Medication One [employer]    [facility] Eliquis    Dose of Eliquis 5mg     Type of Radiographer, therapeutic Assistance    Date Application Sent to Patient 02/02/2023    Application Items Requested Application;Proof of Income;Proof of Out of Pocket Spend;Other    Date Application Sent to Prescriber 03/18/2023    Name of Prescriber Dina Rich    Date Application Received From Patient  04/29/2023   Application Items Received From Patient  Proof of Income;Application;Proof of Out of Pocket Spend;Other   Date Application Received From Provider  04/30/2023   Date Application Submitted to Manufacturer  05/12/2023   Method Application Sent to Manufacturer  Fax   Patient Assistance Determination   Denied  Patient Denial Reasons   Has Not Met Out of Pocket Spend  Patient Notification Method   Telephone Call  Telephone Call Outcome   Successful    Care coordination call placed to BMS in regard to Eliquis application.  Spoke to [place] who informs they are no longer able to give out how much a patient would need to spend to meet the 3% OOP. She informs they use the Medicare Prescription Payment plan tcost to calculate the 3% spend regardless if patient has been set up the the [facility] Prescription Payment Plan.  This also allows them to give an estimate of what month in the year the patient MAY qualify. In this case, the patient MAY qualify in November. She informs that a patient can calculate it themselves by adding up income from a tax return using social security income and any additional distributions.  Successful outreach to patient's daughter Arline Asp. HIPAA verified. Informed Arline Asp that patient has not met the  OOP requirement at this point. Arline Asp informs they will keep a record of her pharmacy spend and reach back out when she thinks her mother has spent the required amount.  Pattricia Boss, CPhT [place]  Office: [phone] Fax: [phone] Email: [email or web address]

## 2023-06-07 ENCOUNTER — Other Ambulatory Visit: Payer: Self-pay | Admitting: Cardiology

## 2023-06-07 NOTE — Telephone Encounter (Signed)
 Prescription refill request for Eliquis received. Indication: AF Last office visit: 12/28/22  Leda Gauze PA-C Scr: 0.72 on 02/16/23  Epic Age: 83 Weight: 98.9kg  Based on above findings Eliquis 5mg  twice daily is the appropriate dose.  Refill approved.

## 2023-06-14 ENCOUNTER — Encounter: Payer: Self-pay | Admitting: Pediatrics

## 2023-06-15 ENCOUNTER — Ambulatory Visit: Payer: Medicare Other | Admitting: Family Medicine

## 2023-06-15 ENCOUNTER — Encounter: Payer: Self-pay | Admitting: Family Medicine

## 2023-06-15 ENCOUNTER — Ambulatory Visit (INDEPENDENT_AMBULATORY_CARE_PROVIDER_SITE_OTHER): Payer: Medicare Other | Admitting: Family Medicine

## 2023-06-15 VITALS — BP 126/82 | HR 55 | Temp 98.2°F | Ht 70.0 in | Wt 228.5 lb

## 2023-06-15 DIAGNOSIS — D509 Iron deficiency anemia, unspecified: Secondary | ICD-10-CM

## 2023-06-15 DIAGNOSIS — E782 Mixed hyperlipidemia: Secondary | ICD-10-CM | POA: Diagnosis not present

## 2023-06-15 DIAGNOSIS — Z860100 Personal history of colon polyps, unspecified: Secondary | ICD-10-CM

## 2023-06-15 DIAGNOSIS — E559 Vitamin D deficiency, unspecified: Secondary | ICD-10-CM | POA: Diagnosis not present

## 2023-06-15 DIAGNOSIS — Z6832 Body mass index (BMI) 32.0-32.9, adult: Secondary | ICD-10-CM

## 2023-06-15 DIAGNOSIS — Z7689 Persons encountering health services in other specified circumstances: Secondary | ICD-10-CM | POA: Insufficient documentation

## 2023-06-15 DIAGNOSIS — Z13820 Encounter for screening for osteoporosis: Secondary | ICD-10-CM

## 2023-06-15 DIAGNOSIS — E66811 Obesity, class 1: Secondary | ICD-10-CM | POA: Insufficient documentation

## 2023-06-15 DIAGNOSIS — F3342 Major depressive disorder, recurrent, in full remission: Secondary | ICD-10-CM

## 2023-06-15 DIAGNOSIS — Z23 Encounter for immunization: Secondary | ICD-10-CM

## 2023-06-15 DIAGNOSIS — Z780 Asymptomatic menopausal state: Secondary | ICD-10-CM

## 2023-06-15 DIAGNOSIS — G4737 Central sleep apnea in conditions classified elsewhere: Secondary | ICD-10-CM

## 2023-06-15 DIAGNOSIS — Z13220 Encounter for screening for lipoid disorders: Secondary | ICD-10-CM

## 2023-06-15 DIAGNOSIS — I4891 Unspecified atrial fibrillation: Secondary | ICD-10-CM

## 2023-06-15 DIAGNOSIS — M5442 Lumbago with sciatica, left side: Secondary | ICD-10-CM

## 2023-06-15 DIAGNOSIS — G8929 Other chronic pain: Secondary | ICD-10-CM

## 2023-06-15 DIAGNOSIS — K2100 Gastro-esophageal reflux disease with esophagitis, without bleeding: Secondary | ICD-10-CM

## 2023-06-15 NOTE — Assessment & Plan Note (Signed)
 Goal 60-100. Vitamin d level ordered.

## 2023-06-15 NOTE — Assessment & Plan Note (Signed)
 Today we reviewed your medical history, health maintenance items, and current concerns. I am happy to be taking over your medical care. You have been referred for a DEXA scan to screen for osteoporosis, they will call you to schedule. You received your first shingles vaccine today. I will see you back in 1 month for AWV and labs 1 week prior.

## 2023-06-15 NOTE — Assessment & Plan Note (Signed)
 Compliant on CPAP. Followed by Dr Vickey Huger.

## 2023-06-15 NOTE — Assessment & Plan Note (Signed)
 Chronic, rate controlled. Continue Eliquis 5mg  BID, Diltiazem 300mg  daily, and Metoprolol 50mg  in AM and 25mg  in PM per Cardiology. Denies bleeding.

## 2023-06-15 NOTE — Assessment & Plan Note (Signed)
 Counseled on importance of weight management for overall health. Encouraged low calorie, heart healthy diet and moderate intensity exercise 150 minutes weekly. This is 3-5 times weekly for 30-50 minutes each session. Goal should be pace of 3 miles/hours, or walking 1.5 miles in 30 minutes and include strength training. Discussed risks of obesity.

## 2023-06-15 NOTE — Assessment & Plan Note (Signed)
 Continue Flexeril PRN and Tylenol. Advised against NSAID use. Hx of DJD.

## 2023-06-15 NOTE — Assessment & Plan Note (Signed)
 Chronic. Continue Ferrous Sulfate 325mg  every other day. CBC prior to next visit.

## 2023-06-15 NOTE — Assessment & Plan Note (Signed)
 Followed by GI. Upcoming Endoscopy due to choking with esophageal stricture. Continue protonix. Anti-reflux measures such as raising the head of the bed, avoiding tight clothing or belts, avoiding eating late at night and not lying down shortly after mealtime and achieving weight loss are discussed. Avoid ASA, NSAID's, caffeine, peppermints, alcohol and tobacco.

## 2023-06-15 NOTE — Assessment & Plan Note (Signed)
 Due follow up in 2022, she declines due to bowel prep. Does have upcoming endo with GI. Denies GIB, melena, or hematochezia.

## 2023-06-15 NOTE — Patient Instructions (Signed)
 It was great to meet you today and I'm excited to have you join the [facility] practice. I hope you had a positive experience today! If you feel so inclined, please feel free to recommend our practice to friends and family. Kurtis Bushman, FNP-C

## 2023-06-15 NOTE — Assessment & Plan Note (Signed)
 Well controlled on Lexapro and PRN Buspar.      06/15/2023   10:00 AM 08/11/2022    3:10 PM 10/29/2021    2:11 AM 07/17/2021   11:24 AM 02/03/2021   11:40 AM  Depression screen PHQ 2/9  Decreased Interest 0 0 0 0 0  Down, Depressed, Hopeless 1 0 0 1 1  PHQ - 2 Score 1 0 0 1 1  Altered sleeping 1      Tired, decreased energy 3      Change in appetite 1      Feeling bad or failure about yourself  0      Trouble concentrating 1      Moving slowly or fidgety/restless 0      Suicidal thoughts 0      PHQ-9 Score 7      Difficult doing work/chores Somewhat difficult

## 2023-06-15 NOTE — Progress Notes (Signed)
 New Patient Office Visit  Subjective    Patient ID: Darlene [name], female    DOB: February 09, 1941  Age: 83 y.o. MRN: [id number]  CC:  Chief Complaint  Patient presents with   Establish Care   Medical Management of Chronic Issues    HPI Darlene [name] presents to establish care. Oriented to practice routines and expectations. Has been seeing PCP regularly, last CPE 02/16/2023 . PMH includes anxiety and depression well controlled on Lexapro 20 mg daily and Buspar 5-10mg  PRN, low back pain and DJD well controlled on PRN flexeril, GERD on Protonix with upcoming endoscopy and recent discovery of hiatal hernia and spigelian hernia, HLD, afib on Eliquis, diltiazem, and metoprolol, and OSA on CPAP. She does see Gastroenterology, Cardiology, Dermatology, and Neurology.  Breast CA screening: Mammogram status: Completed 2025. Repeat every year Cervical CA screening:  aged out Colon CA screening: colonoscopy 6 years ago without abnormalities. DEXA: DEXA scan ordered to evaluate the patient for osteoporosis. Tobacco: non-smoker Vaccines:  UTD, shingles today  Health Maintenance  Topic Date Due   DEXA SCAN  Never done   Medicare Annual Wellness (AWV)  08/11/2023   COVID-19 Vaccine (5 - Moderna risk 2024-25 season) 07/01/2023 (Originally 05/30/2023)   Zoster Vaccines- Shingrix (2 of 2) 08/10/2023   INFLUENZA VACCINE  10/15/2023   MAMMOGRAM  05/27/2025   DTaP/Tdap/Td (2 - Td or Tdap) 08/30/2032   Pneumonia Vaccine 15+ Years old  Completed   HPV VACCINES  Aged Out   Colonoscopy  Discontinued     Outpatient Encounter Medications as of 06/15/2023  Medication Sig   acetaminophen (TYLENOL) 500 MG tablet Take 500 mg by mouth every 6 (six) hours as needed.   albuterol (VENTOLIN HFA) 108 (90 Base) MCG/ACT inhaler Inhale 2 puffs into the lungs every 6 (six) hours as needed for wheezing or shortness of breath.   apixaban (ELIQUIS) 5 MG TABS tablet TAKE 1 TABLET BY MOUTH TWICE  DAILY   Ascorbic Acid  (VITAMIN C) 500 MG CAPS Take 500 mg by mouth.    busPIRone (BUSPAR) 10 MG tablet TAKE 1/2 TO 1 TABLET BY MOUTH 3  TIMES DAILY AS NEEDED FOR  ANXIETY, MOOD OR IRRITABILITY   CHOLECALCIFEROL PO Take 5,000 Units by mouth daily.   Cyanocobalamin 2500 MCG TABS Take 2,500 mcg by mouth once a week.   cyclobenzaprine (FLEXERIL) 10 MG tablet Take 1/2 to 1 tablet    2 to 3 x /day if needed for Muscle Spasms (Patient taking differently: Take 5-10 mg by mouth 3 (three) times daily as needed for muscle spasms. Take 1/2 to 1 tablet    2 to 3 x /day if needed for Muscle Spasms)   diltiazem (CARDIZEM CD) 300 MG 24 hr capsule TAKE 1 CAPSULE BY MOUTH DAILY   escitalopram (LEXAPRO) 20 MG tablet TAKE 1 TABLET BY MOUTH DAILY FOR MOOD, ANXIETY &amp; IRRITABILITY   ferrous sulfate 325 (65 FE) MG tablet Take 1 tablet (325 mg total) by mouth every other day.   ipratropium-albuterol (DUONEB) 0.5-2.5 (3) MG/3ML SOLN USE 1 VIAL IN NEBULIZER EVERY 4 HOURS (Patient taking differently: Take 3 mLs by nebulization every 4 (four) hours as needed (SOB/Wheezing).)   Magnesium 250 MG TABS Take 1 tablet by mouth daily.   metoprolol tartrate (LOPRESSOR) 25 MG tablet TAKE 2 TABLETS BY MOUTH IN THE  MORNING AND 1 TABLET BY MOUTH IN THE EVENING   nitroGLYCERIN (NITROSTAT) 0.4 MG SL tablet Place 1 tablet (0.4 mg total) under the  tongue every 5 (five) minutes as needed for chest pain.   pantoprazole (PROTONIX) 40 MG tablet Take  1 tablet  Daily  to prevent  Indigestion & Acid Reflux   Probiotic Product (PROBIOTIC DAILY PO) Take 1 tablet by mouth daily.   psyllium (METAMUCIL) 58.6 % packet Take 1 packet by mouth daily.   zinc gluconate 50 MG tablet Take 50 mg by mouth daily.   No facility-administered encounter medications on file as of 06/15/2023.    Past Medical History:  Diagnosis Date   Anxiety    Chronic lower back pain    Depression    DJD (degenerative joint disease)    knees, neck   GERD (gastroesophageal reflux disease)     has had dilitation in the past   Hypertension     Past Surgical History:  Procedure Laterality Date   ABDOMINAL HYSTERECTOMY  03/1979   BREAST BIOPSY Left 1986 and 1987   CATARACT EXTRACTION, BILATERAL Bilateral 2014   Dr. Leonette Monarch, in [place]   EYE [facility]  [facility]24   [facility]    Family History  Problem Relation Age of Onset   Hypertension Mother    Heart disease Mother    Hypertension Father    Heart disease Father    Tremor Sister    Dementia Brother    Hypertension Daughter    Colon cancer Neg Hx    Esophageal cancer Neg Hx    Liver disease Neg Hx     Social History   Socioeconomic History   Marital status: Widowed    Spouse name: Not on file   Number of children: 2   Years of education: Not on file   Highest education level: 12th grade  Occupational History   Occupation: retired    Comment: worked in Designer, fashion/clothing  Tobacco Use   Smoking status: Never   Smokeless tobacco: Never  Vaping Use   Vaping status: Never Used  Substance and Sexual Activity   Alcohol use: Yes    Alcohol/week: 3.0 standard drinks of alcohol    Types: 3 Standard drinks or equivalent per week    Comment: 3 nights out of the week   Drug use: No   Sexual activity: Not Currently  Other Topics Concern   Not on file  Social History Narrative   Not on file   Social Drivers of Health   Financial Resource Strain: Low Risk  (02/03/2021)   Overall Financial Resource Strain (CARDIA)    Difficulty of Paying Living Expenses: Not very hard  Food Insecurity: No Food Insecurity (07/17/2021)   Hunger Vital Sign    Worried About Running Out of Food in the Last Year: Never true    Ran Out of Food in the Last Year: Never true  Transportation Needs: No Transportation Needs (07/17/2021)   PRAPARE - Administrator, Civil Service (Medical): No    Lack of Transportation (Non-Medical): No  Physical Activity: Inactive (04/28/2019)   Exercise Vital Sign    Days of Exercise  per Week: 0 days    Minutes of Exercise per Session: 0 min  Stress: No Stress Concern Present (04/28/2019)   [employer] of Occupational Health - Occupational Stress Questionnaire    Feeling of Stress : Only a little  Social Connections: Moderately Integrated (04/28/2019)   Social Connection and Isolation Panel [NHANES]    Frequency of Communication with Friends and Family: More than three times a week    Frequency  of Social Gatherings with Friends and Family: More than three times a week    Attends Religious Services: 1 to 4 times per year    Active Member of [employer] or Organizations: Yes    Attends Banker Meetings: More than 4 times per year    Marital Status: Widowed  Intimate Partner Violence: Not At Risk (04/28/2019)   Humiliation, Afraid, Rape, and Kick questionnaire    Fear of Current or Ex-Partner: No    Emotionally Abused: No    Physically Abused: No    Sexually Abused: No    Review of Systems  Constitutional: Negative.   HENT: Negative.    Eyes: Negative.   Respiratory: Negative.    Cardiovascular: Negative.   Gastrointestinal:  Positive for heartburn.  Genitourinary: Negative.   Musculoskeletal:  Positive for back pain.  Skin: Negative.   Neurological: Negative.   Endo/Heme/Allergies: Negative.   Psychiatric/Behavioral:  Positive for depression.   All other systems reviewed and are negative.       Objective    BP 126/82   Pulse (!) 55   Temp 98.2 F (36.8 C)   Ht 5\' 10"  (1.778 m)   Wt 228 lb 8 oz (103.6 kg)   SpO2 99%   BMI 32.79 kg/m   Physical Exam Vitals and nursing note reviewed.  Constitutional:      Appearance: Normal appearance. She is obese.  HENT:     Head: Normocephalic and atraumatic.  Cardiovascular:     Rate and Rhythm: Normal rate and regular rhythm.     Pulses: Normal pulses.     Heart sounds: Normal heart sounds.  Pulmonary:     Effort: Pulmonary effort is normal.     Breath sounds: Normal breath sounds.   Skin:    General: Skin is warm and dry.  Neurological:     General: No focal deficit present.     Mental Status: She is alert and oriented to person, place, and time. Mental status is at baseline.  Psychiatric:        Mood and Affect: Mood normal.        Behavior: Behavior normal.        Thought Content: Thought content normal.        Judgment: Judgment normal.     Last CBC Lab Results  Component Value Date   WBC 5.8 02/16/2023   HGB 13.0 02/16/2023   HCT 39.3 02/16/2023   MCV 98.0 02/16/2023   MCH 32.4 02/16/2023   RDW 12.7 02/16/2023   PLT 188 02/16/2023   Last metabolic panel Lab Results  Component Value Date   GLUCOSE 89 02/16/2023   NA 142 02/16/2023   K 4.4 02/16/2023   CL 105 02/16/2023   CO2 30 02/16/2023   BUN 17 02/16/2023   CREATININE 0.72 02/16/2023   EGFR 83 02/16/2023   CALCIUM 9.3 02/16/2023   PROT 6.0 (L) 02/16/2023   ALBUMIN 3.7 08/31/2022   BILITOT 1.1 02/16/2023   ALKPHOS 55 08/31/2022   AST 15 02/16/2023   ALT 12 02/16/2023   ANIONGAP 9 08/31/2022   Last lipids Lab Results  Component Value Date   CHOL 191 02/16/2023   HDL 70 02/16/2023   LDLCALC 105 (H) 02/16/2023   TRIG 70 02/16/2023   CHOLHDL 2.7 02/16/2023   Last hemoglobin A1c Lab Results  Component Value Date   HGBA1C 5.6 02/16/2023   Last thyroid functions Lab Results  Component Value Date   TSH 1.47 02/16/2023  Last vitamin D Lab Results  Component Value Date   VD25OH 69 02/16/2023   Last vitamin B12 and Folate Lab Results  Component Value Date   VITAMINB12 1,326 (H) 02/16/2023        Assessment & Plan:   Problem List Items Addressed This Visit     Chronic lower back pain   Continue Flexeril PRN and Tylenol. Advised against NSAID use. Hx of DJD.      Depression   Well controlled on Lexapro and PRN Buspar.      06/15/2023   10:00 AM 08/11/2022    3:10 PM 10/29/2021    2:11 AM 07/17/2021   11:24 AM 02/03/2021   11:40 AM  Depression screen PHQ 2/9   Decreased Interest 0 0 0 0 0  Down, Depressed, Hopeless 1 0 0 1 1  PHQ - 2 Score 1 0 0 1 1  Altered sleeping 1      Tired, decreased energy 3      Change in appetite 1      Feeling bad or failure about yourself  0      Trouble concentrating 1      Moving slowly or fidgety/restless 0      Suicidal thoughts 0      PHQ-9 Score 7      Difficult doing work/chores Somewhat difficult             GERD (gastroesophageal reflux disease)   Followed by GI. Upcoming Endoscopy due to choking with esophageal stricture. Continue protonix. Anti-reflux measures such as raising the head of the bed, avoiding tight clothing or belts, avoiding eating late at night and not lying down shortly after mealtime and achieving weight loss are discussed. Avoid ASA, NSAID's, caffeine, peppermints, alcohol and tobacco.       Relevant Orders   CBC with Differential/Platelet   Comprehensive metabolic panel with GFR   Lipid panel   RESOLVED: Morbid obesity (HCC) - BMI 30+ with OSA   Relevant Orders   CBC with Differential/Platelet   Comprehensive metabolic panel with GFR   Lipid panel   History of colon polyps   Due follow up in 2022, she declines due to bowel prep. Does have upcoming endo with GI. Denies GIB, melena, or hematochezia.      Hyperlipidemia, mixed   Fasting labs ordered. Diet controlled. I recommend consuming a heart healthy diet such as Mediterranean diet or DASH diet with whole grains, fruits, vegetable, fish, lean meats, nuts, and olive oil. Limit sweets and processed foods. I also encourage moderate intensity exercise as tolerated.      Relevant Orders   Lipid panel   Vitamin D deficiency   Goal 60-100. Vitamin d level ordered.      Relevant Orders   VITAMIN D 25 Hydroxy (Vit-D Deficiency, Fractures)   Iron deficiency anemia   Chronic. Continue Ferrous Sulfate 325mg  every other day. CBC prior to next visit.      Relevant Orders   Vitamin B12   Atrial fibrillation with RVR (HCC)    Chronic, rate controlled. Continue Eliquis 5mg  BID, Diltiazem 300mg  daily, and Metoprolol 50mg  in AM and 25mg  in PM per Cardiology. Denies bleeding.       Relevant Orders   CBC with Differential/Platelet   Comprehensive metabolic panel with GFR   Lipid panel   Central sleep apnea associated with atrial fibrillation (HCC)   Compliant on CPAP. Followed by Dr Vickey Huger.       Encounter to establish care with  new provider - Primary   Today we reviewed your medical history, health maintenance items, and current concerns. I am happy to be taking over your medical care. You have been referred for a DEXA scan to screen for osteoporosis, they will call you to schedule. You received your first shingles vaccine today. I will see you back in 1 month for AWV and labs 1 week prior.       Class 1 obesity with serious comorbidity and body mass index (BMI) of 32.0 to 32.9 in adult   Counseled on importance of weight management for overall health. Encouraged low calorie, heart healthy diet and moderate intensity exercise 150 minutes weekly. This is 3-5 times weekly for 30-50 minutes each session. Goal should be pace of 3 miles/hours, or walking 1.5 miles in 30 minutes and include strength training. Discussed risks of obesity.      Other Visit Diagnoses       Encounter for osteoporosis screening in asymptomatic postmenopausal patient       Relevant Orders   DG Bone Density     Screening for lipoid disorders       Relevant Orders   Lipid panel     Need for vaccination       Relevant Orders   Zoster Recombinant (Shingrix ) (Completed)       Return in about 8 weeks (around 08/10/2023) for AWV, chronic follow-up with labs 1 week prior.   Park Meo, FNP

## 2023-06-15 NOTE — Assessment & Plan Note (Signed)
 Fasting labs ordered. Diet controlled. I recommend consuming a heart healthy diet such as Mediterranean diet or DASH diet with whole grains, fruits, vegetable, fish, lean meats, nuts, and olive oil. Limit sweets and processed foods. I also encourage moderate intensity exercise as tolerated.

## 2023-06-18 ENCOUNTER — Ambulatory Visit: Payer: Self-pay | Admitting: Surgery

## 2023-06-18 ENCOUNTER — Telehealth: Payer: Self-pay | Admitting: Cardiology

## 2023-06-18 NOTE — H&P (Signed)
 Darlene [name] [id number]   Referring Provider:  Kurtis Bushman, NP   Subjective   Chief Complaint: New Consultation     History of Present Illness:    83 year old woman with history of anxiety and depression, low back pain/DJD, GERD, hyperlipidemia, atrial fibrillation on Eliquis, obstructive sleep apnea on CPAP, pulmonary hypertension, who presents for evaluation of large paraesophageal hernia as well as a right lower quadrant spigelian hernia noted on recent CT scan.  She has had GERD for quite some time, and recently has noted postprandial chest pain, nausea, early satiety and occasional emesis.  Notes occasional dysphagia and has a history of esophageal strictures requiring dilation, last EGD was in 2019.  Does still have some solid food dysphagia and eats generally small bites.  No unplanned weight loss recently.  Does note chronic constipation but actually in the last 24 hours has had more diarrhea.  She sees Dr. Maren Beach and is scheduled for endoscopy on the 16th of this month. At that visit, she was noted to have a firm palpable mass in the right lower quadrant.  The patient has noted this for quite some time and states that it is more evident when she stands or changes position.  It is occasionally tender.  Previous abdominal surgery of total abdominal hysterectomy.  She underwent a CT scan on March 17th and I have reviewed the images and the report personally.  She has a large paraesophageal hernia with the stomach resting largely in the right chest along with a moderate right lower quadrant spigelian versus inguinal hernia containing nonobstructed ascending colon.  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  The patient is here with her daughter.  She is largely independent and ambulates with a cane but today she is in a wheelchair.  Patient reports that she has some shortness of breath and that she would not be able to walk around the grocery store for example, does use a scooter to do this.  Review of Systems: A complete review of systems was obtained from the patient.  I have reviewed this information and discussed as appropriate with the patient.  See HPI as well for other ROS.   Medical History: Past Medical History:  Diagnosis Date   Anemia    Anxiety    Arrhythmia    Arthritis    GERD (gastroesophageal reflux disease)    Glaucoma (increased eye pressure)    Hypertension    Sleep apnea     There is no problem list on file for this patient.   Past Surgical History:  Procedure Laterality Date   HYSTERECTOMY       Allergies  Allergen Reactions   Cephalosporins Rash    Current Outpatient Medications on File Prior to Visit  Medication Sig Dispense Refill   busPIRone (BUSPAR) 10 MG tablet TAKE 1/2 TO 1 TABLET BY MOUTH 3  TIMES DAILY AS NEEDED FOR  ANXIETY, MOOD OR IRRITABILITY     cyanocobalamin, vitamin B-12, 2,500 mcg Tab Take 2,500 mcg by mouth     dilTIAZem (CARDIZEM CD) 300 MG CD capsule Take 1 capsule by mouth once daily     ELIQUIS 5 mg tablet Take 1 tablet by mouth 2 (two) times daily  escitalopram oxalate (LEXAPRO) 20 MG tablet TAKE 1 TABLET BY MOUTH DAILY FOR MOOD, ANXIETY &amp; IRRITABILITY     metoprolol TARTrate (LOPRESSOR) 25 MG tablet TAKE 2 TABLETS BY MOUTH IN THE  MORNING AND 1 TABLET BY MOUTH IN THE EVENING     pantoprazole (PROTONIX) 40 MG DR tablet Take  1 tablet  Daily  to prevent  Indigestion & Acid Reflux     No current facility-administered medications on file prior to visit.    Family History  Problem Relation Age of Onset   Stroke Mother    High blood pressure (Hypertension) Mother    Diabetes  Mother    Coronary Artery Disease (Blocked arteries around heart) Mother    Coronary Artery Disease (Blocked arteries around heart) Father      Social History   Tobacco Use  Smoking Status Never  Smokeless Tobacco Never     Social History   Socioeconomic History   Marital status: Widowed  Tobacco Use   Smoking status: Never   Smokeless tobacco: Never  Substance and Sexual Activity   Alcohol use: Never   Drug use: Never   Social Drivers of Corporate investment banker Strain: Low Risk  (02/03/2021)   Received from [facility]   Overall Financial Resource Strain (CARDIA)    Difficulty of Paying Living Expenses: Not very hard  Food Insecurity: No Food Insecurity (07/17/2021)   Received from [facility]   Hunger Vital Sign    Worried About Running Out of Food in the Last Year: Never true    Ran Out of Food in the Last Year: Never true  Transportation Needs: No Transportation Needs (07/17/2021)   Received from [facility] - Transportation    Lack of Transportation (Medical): No    Lack of Transportation (Non-Medical): No  Physical Activity: Inactive (04/28/2019)   Received from [facility]   Exercise Vital Sign    Days of Exercise per Week: 0 days    Minutes of Exercise per Session: 0 min  Stress: No Stress Concern Present (04/28/2019)   Received from [facility] - Occupational Stress Questionnaire    Feeling of Stress : Only a little  Social Connections: Moderately Integrated (04/28/2019)   [facility]   Social Connection and Isolation Panel [NHANES]    Frequency of Communication with Friends and Family: More than three times a week    Frequency of Social Gatherings with Friends and Family: More than three times a week    Attends Religious Services: 1 to 4 times per year    Active Member of [employer] or Organizations: Yes    Attends Banker Meetings: More than 4 times per year    Marital Status:  Widowed  Housing Stability: Unknown (06/18/2023)   Housing Stability Vital Sign    Homeless in the Last Year: No    Objective:    Vitals:   06/18/23 0932 06/18/23 0933  BP: 104/65   Pulse: 59   Temp: 36.8 C (98.2 F)   SpO2: 98%   Weight: (!) 102.5 kg (226 lb)   Height: 172.7 cm (5\' 8" )   PainSc:  0-No pain    Body mass index is 34.36 kg/m.  Gen: A&Ox3, no distress  Chest: respiratory effort is normal. Abdomen: soft, nondistended, nontender.  Vaguely palpable mass in the right lower quadrant Neuro: no gross deficit Psych: appropriate mood and affect, normal insight/judgment intact  Skin: warm  and dry   Labs, Imaging and Diagnostic Testing: CT reviewed as above  Assessment and Plan:  Diagnoses and all orders for this visit:  Ventral hernia without obstruction or gangrene  Paraesophageal hernia  Chronic a-fib (CMS/HHS-HCC)  OSA on CPAP    I do think both her paraesophageal hernia and her right lower quadrant hernia merit repair given symptoms of the former and bowel and the latter.  We discussed that there is no emergency to doing this and the decision is completely hers, but I suspect she will have continued issues with eating and has some risk of bowel incarceration/strangulation at the site of the spigelian hernia over time.  I discussed the anatomy using a diagram to demonstrate and described robotic hiatal hernia repair with possible mesh with gastropexy.  Would not do a fundoplication in the setting of prior esophageal strictures and existing dysphagia along with her age and anticipated poor gastric function postoperatively.  We discussed the typical hospitalization. We discussed the typical recovery, timeline, and activity limitations. We discussed the diet transition over the course of 6 to 12 weeks. We discussed the need to change eating techniques and behaviors in order to mitigate postoperative pain and discomfort. We discussed that hiatal hernia repair/reflux  surgery is not "perfect " and GI function/foregut function will be unlikely to be perfect after this surgery.   We then discussed the risks of surgery included but not limited to bleeding, infection, injury to surrounding structures, injury to the lung/aorta, injury to the vagus nerves, hiatal hernia recurrence, clot formation, perioperative cardiac and pulmonary events. We discussed slipped wrap. We discussed the issues with vagal nerve injury such as gastroparesis/early satiey, poor gastric emptying, and ongoing diarrhea. We discussed gas bloat. We discussed issues with potential mesh insertion such as dysphagia/fistula. We discussed that I am typically selective in whether or not we use absorbable mesh. We discussed the recurrence risk is at least 15%, higher in large hernias, but that often recurrence is not clinically significant or symptomatic. We discussed that with subsequent diaphragm/esophageal surgery it is technically more complicated and higher risk as well as satisfaction scores decrease.  We then reviewed the spigelian hernia; we discussed the relevant anatomy and we discussed options for repair.  Discussed that this could be repaired robotically at the time of her paraesophageal hernia repair but that this would be about a 3-3-1/2-hour surgery together.  Discussed risks of bleeding, infection, pain, scarring, injury to structures in the area including nerves, blood vessels, bowel, or bladder; risk of chronic pain, hernia recurrence, risk of seroma or hematoma, urinary retention, and risks of general anesthesia including cardiovascular, pulmonary, and thromboembolic complications.  We discussed small risk of conversion to open repair depending on intraoperative findings and anatomy.  We discussed typical postop recovery, timeline, and activity limitations.  We also discussed the option of ongoing observation, with high rate of ultimately returning for surgery and risk of increasing size/symptoms  from the hernia as well as incarceration/strangulation and went over symptoms that should prompt the patient to seek emergency treatment.    Given the duration of the surgery and her current degree of frailty, we also discussed small possibility that she might not be able to discharge home immediately on postop day 1 and she may require physical therapy/rehabilitation to some degree but the hope would be that we could control her pain adequately and keep her mobile enough that she would be able to go home on postop day 1.  We also discussed  pros and cons of not undergoing repair of 1 or both of these issues. Questions were welcomed and answered to the patient's satisfaction.  He would like to proceed with surgery.  We will go ahead and plan to schedule, we will request cardiac clearance.  She has her endoscopy scheduled for the 16th and so I anticipate those results will be available prior to her surgery.     Phylliss Blakes MD FACS

## 2023-06-18 NOTE — Telephone Encounter (Signed)
Please advise holding Eliquis prior to hernia repair.   Thank you!  DW

## 2023-06-18 NOTE — Telephone Encounter (Signed)
   Pre-operative Risk Assessment    Patient Name: Darlene [name]  DOB: 04-21-1940 MRN: [id number]      Request for Surgical Clearance    Procedure:   Hernia Repair x 2  Date of Surgery:  Clearance TBD                                 Surgeon:  Dr. Phylliss Blakes Surgeon's Group or Practice Name:  [facility]  Phone number:  [phone] Fax number:  [phone]   Type of Clearance Requested:   - Pharmacy:  Hold Apixaban (Eliquis) ?   Type of Anesthesia:  General    Additional requests/questions:    Lajuana Matte   06/18/2023, 2:12 PM

## 2023-06-21 ENCOUNTER — Encounter: Payer: Self-pay | Admitting: Pediatrics

## 2023-06-23 ENCOUNTER — Telehealth: Payer: Self-pay | Admitting: *Deleted

## 2023-06-23 NOTE — Telephone Encounter (Signed)
  Patient Consent for Virtual Visit     Darlene [name] has provided verbal consent on 06/23/2023 for a virtual visit (video or telephone).   CONSENT FOR VIRTUAL VISIT FOR:  Darlene [name]  By participating in this virtual visit I agree to the following:  I hereby voluntarily request, consent and authorize [facility] and its employed or contracted physicians, physician assistants, nurse practitioners or other licensed health care professionals (the Practitioner), to provide me with telemedicine health care services (the "Services") as deemed necessary by the treating Practitioner. I acknowledge and consent to receive the Services by the Practitioner via telemedicine. I understand that the telemedicine visit will involve communicating with the Practitioner through live audiovisual communication technology and the disclosure of certain medical information by electronic transmission. I acknowledge that I have been given the opportunity to request an in-person assessment or other available alternative prior to the telemedicine visit and am voluntarily participating in the telemedicine visit.  I understand that I have the right to withhold or withdraw my consent to the use of telemedicine in the course of my care at any time, without affecting my right to future care or treatment, and that the Practitioner or I may terminate the telemedicine visit at any time. I understand that I have the right to inspect all information obtained and/or recorded in the course of the telemedicine visit and may receive copies of available information for a reasonable fee.  I understand that some of the potential risks of receiving the Services via telemedicine include:  Delay or interruption in medical evaluation due to technological equipment failure or disruption; Information transmitted may not be sufficient (e.g. poor resolution of images) to allow for appropriate medical decision making by the Practitioner;  and/or  In rare instances, security protocols could fail, causing a breach of personal health information.  Furthermore, I acknowledge that it is my responsibility to provide information about my medical history, conditions and care that is complete and accurate to the best of my ability. I acknowledge that Practitioner's advice, recommendations, and/or decision may be based on factors not within their control, such as incomplete or inaccurate data provided by me or distortions of diagnostic images or specimens that may result from electronic transmissions. I understand that the practice of medicine is not an exact science and that Practitioner makes no warranties or guarantees regarding treatment outcomes. I acknowledge that a copy of this consent can be made available to me via my patient portal [facility] MyChart), or I can request a printed copy by calling the office of [facility].    I understand that my insurance will be billed for this visit.   I have read or had this consent read to me. I understand the contents of this consent, which adequately explains the benefits and risks of the Services being provided via telemedicine.  I have been provided ample opportunity to ask questions regarding this consent and the Services and have had my questions answered to my satisfaction. I give my informed consent for the services to be provided through the use of telemedicine in my medical care

## 2023-06-23 NOTE — Telephone Encounter (Signed)
   Name: Darlene [name]  DOB: Mar 08, 1941  MRN: [id number]  Primary Cardiologist: Dina Rich, MD   Preoperative team, please contact this patient and set up a phone call appointment for further preoperative risk assessment. Please obtain consent and complete medication review. Thank you for your help.  I confirm that guidance regarding antiplatelet and oral anticoagulation therapy has been completed and, if necessary, noted below.  Per office protocol, patient can hold Eliquis for 2 days prior to procedure.   Patient will not need bridging with Lovenox (enoxaparin) around procedure.   I also confirmed the patient resides in the state of [place]. As per [facility] Medical Board telemedicine laws, the patient must reside in the state in which the provider is licensed.   Ronney Asters, NP 06/23/2023, 11:24 AM [facility]

## 2023-06-23 NOTE — Telephone Encounter (Signed)
 Patient scheduled 06/24/23

## 2023-06-23 NOTE — Telephone Encounter (Signed)
 Patient with diagnosis of atrial fibrillation on Eliquis for anticoagulation.    Procedure:   Hernia Repair x 2   Date of Surgery:  Clearance TBD     CHA2DS2-VASc Score = 3   This indicates a 3.2% annual risk of stroke. The patient's score is based upon: CHF History: 0 HTN History: 0 Diabetes History: 0 Stroke History: 0 Vascular Disease History: 0 Age Score: 2 Gender Score: 1    CrCl 97 Platelet count 188  Per office protocol, patient can hold Eliquis for 2 days prior to procedure.   Patient will not need bridging with Lovenox (enoxaparin) around procedure.  **This guidance is not considered finalized until pre-operative APP has relayed final recommendations.**

## 2023-06-24 ENCOUNTER — Ambulatory Visit: Attending: Cardiovascular Disease

## 2023-06-24 DIAGNOSIS — Z01810 Encounter for preprocedural cardiovascular examination: Secondary | ICD-10-CM

## 2023-06-24 NOTE — Progress Notes (Signed)
 Virtual Visit via Telephone Note   Because of TAHIRY [name] co-morbid illnesses, she is at least at moderate risk for complications without adequate follow up.  This format is felt to be most appropriate for this patient at this time.  Due to technical limitations with video connection Web designer), today's appointment will be conducted as an audio only telehealth visit, and CHANTILLY LINSKEY verbally agreed to proceed in this manner.   All issues noted in this document were discussed and addressed.  No physical exam could be performed with this format.  Evaluation Performed:  Preoperative cardiovascular risk assessment _____________   Date:  06/24/2023   Patient ID:  Darlene [name], DOB 09-Oct-1940, MRN [id number] Patient Location:  Home Provider location:   Office  Primary Care Provider:  Park Meo, FNP Primary Cardiologist:  Dina Rich, MD  Chief Complaint / Patient Profile   83 y.o. y/o female with a h/o permanent AF (on Eliquis), OSA (on CPAP), HLD, pulmonary HTN but endorsed episodes of heart racing and wore 2-week ZIO monitor who is pending hernia repair and presents today for telephonic preoperative cardiovascular risk assessment.  History of Present Illness    Darlene [name] is a 83 y.o. female who presents via audio/video conferencing for a telehealth visit today.  Pt was last seen in cardiology clinic on 12/28/2022 by [name], PA.  At that time Darlene [name] was doing well but endorsed 2 weeks of heart racing and wore event monitor that showed controlled AF.  She was advised to follow-up with sleep medicine for CPAP adjustment.  Patient has not had any interval ED visits per record review.  The patient is now pending procedure as outlined above. Since her last visit, she reports doing well with no episodes of chest pain or shortness of breath with exertion.  She does note a few episodes of tachycardia that are very fleeting and are not sustained.  She has been  compliant with her current medicines and denies any adverse reactions.  She denies chest pain, shortness of breath, lower extremity edema, fatigue, palpitations, melena, hematuria, hemoptysis, diaphoresis, weakness, presyncope, syncope, orthopnea, and PND.    Past Medical History    Past Medical History:  Diagnosis Date   Anxiety    Chronic lower back pain    Depression    DJD (degenerative joint disease)    knees, neck   GERD (gastroesophageal reflux disease)    has had dilitation in the past   Hypertension    Past Surgical History:  Procedure Laterality Date   ABDOMINAL HYSTERECTOMY  03/1979   BREAST BIOPSY Left 1986 and 1987   CATARACT EXTRACTION, BILATERAL Bilateral 2014   Dr. Leonette Monarch, in [place]   [facility]  [facility] [place]    Allergies  Allergies  Allergen Reactions   Cephalosporins Rash   Keflex [Cephalexin] Rash    Home Medications    Prior to Admission medications   Medication Sig Start Date End Date Taking? Authorizing Provider  acetaminophen (TYLENOL) 500 MG tablet Take 500 mg by mouth every 6 (six) hours as needed.    [provider]  albuterol (VENTOLIN HFA) 108 (90 Base) MCG/ACT inhaler Inhale 2 puffs into the lungs every 6 (six) hours as needed for wheezing or shortness of breath. 05/22/22   [name], NP  apixaban (ELIQUIS) 5 MG TABS tablet TAKE 1 TABLET BY MOUTH TWICE  DAILY 06/07/23   [name],  MD  Ascorbic Acid (VITAMIN C) 500 MG CAPS Take 500 mg by mouth.     [provider]  busPIRone (BUSPAR) 10 MG tablet TAKE 1/2 TO 1 TABLET BY MOUTH 3  TIMES DAILY AS NEEDED FOR  ANXIETY, MOOD OR IRRITABILITY 10/22/22   [name], NP  CHOLECALCIFEROL PO Take 5,000 Units by mouth daily.    [provider]  Cyanocobalamin 2500 MCG TABS Take 2,500 mcg by mouth once a week.    [provider]  cyclobenzaprine (FLEXERIL) 10 MG tablet Take 1/2 to 1 tablet    2 to 3 x /day if needed for  Muscle Spasms Patient taking differently: Take 5-10 mg by mouth 3 (three) times daily as needed for muscle spasms. Take 1/2 to 1 tablet    2 to 3 x /day if needed for Muscle Spasms 04/25/19   Lucky Cowboy, MD  diltiazem (CARDIZEM CD) 300 MG 24 hr capsule TAKE 1 CAPSULE BY MOUTH DAILY 10/22/22   [name], MD  escitalopram (LEXAPRO) 20 MG tablet TAKE 1 TABLET BY MOUTH DAILY FOR MOOD, ANXIETY &amp; IRRITABILITY 10/10/22   [name], NP  ferrous sulfate 325 (65 FE) MG tablet Take 1 tablet (325 mg total) by mouth every other day. 01/15/20   [name], NP  ipratropium-albuterol (DUONEB) 0.5-2.5 (3) MG/3ML SOLN USE 1 VIAL IN NEBULIZER EVERY 4 HOURS Patient taking differently: Take 3 mLs by nebulization every 4 (four) hours as needed (SOB/Wheezing). 04/02/22   [name], NP  Magnesium 250 MG TABS Take 1 tablet by mouth daily.    [provider]  metoprolol tartrate (LOPRESSOR) 25 MG tablet TAKE 2 TABLETS BY MOUTH IN THE  MORNING AND 1 TABLET BY MOUTH IN THE EVENING 05/05/23   Branch, Dorothe Pea, MD  nitroGLYCERIN (NITROSTAT) 0.4 MG SL tablet Place 1 tablet (0.4 mg total) under the tongue every 5 (five) minutes as needed for chest pain. 02/05/20   Strader, Lennart Pall, PA-C  pantoprazole (PROTONIX) 40 MG tablet Take  1 tablet  Daily  to prevent  Indigestion & Acid Reflux 03/03/23   Lucky Cowboy, MD  Probiotic Product (PROBIOTIC DAILY PO) Take 1 tablet by mouth daily.    [provider]  psyllium (METAMUCIL) 58.6 % packet Take 1 packet by mouth daily.    [provider]  zinc gluconate 50 MG tablet Take 50 mg by mouth daily.    [provider]    Physical Exam    Vital Signs:  [name] does not have vital signs available for review today.  Given telephonic nature of communication, physical exam is limited. AAOx3. NAD. Normal affect.  Speech and respirations are unlabored.  Accessory Clinical Findings    None  Assessment & Plan     1.  Preoperative Cardiovascular Risk Assessment:  -Patient's RCRI score is 0.9%  The patient affirms she has been doing well without any new cardiac symptoms. They are able to achieve 4 METS without cardiac limitations. Therefore, based on ACC/AHA guidelines, the patient would be at acceptable risk for the planned procedure without further cardiovascular testing. The patient was advised that if she develops new symptoms prior to surgery to contact our office to arrange for a follow-up visit, and she verbalized understanding.   The patient was advised that if she develops new symptoms prior to surgery to contact our office to arrange for a follow-up visit, and she verbalized understanding.   Per office protocol, patient can hold  Eliquis for 2 days prior to procedure.   A copy of this note will be routed to requesting surgeon.  Time:   Today, I have spent 8 minutes with the patient with telehealth technology discussing medical history, symptoms, and management plan.     Napoleon Form, Leodis Rains, NP  06/24/2023, 7:52 AM

## 2023-06-28 ENCOUNTER — Emergency Department (EMERGENCY_DEPARTMENT_HOSPITAL)
Admission: EM | Admit: 2023-06-28 | Discharge: 2023-06-29 | Disposition: A | Discharge status: Home / Self Care | Source: Home / Self Care | Attending: Emergency Medicine | Admitting: Emergency Medicine

## 2023-06-28 ENCOUNTER — Emergency Department (HOSPITAL_COMMUNITY)

## 2023-06-28 ENCOUNTER — Other Ambulatory Visit: Payer: Self-pay

## 2023-06-28 ENCOUNTER — Encounter (HOSPITAL_COMMUNITY): Payer: Self-pay

## 2023-06-28 DIAGNOSIS — K644 Residual hemorrhoidal skin tags: Secondary | ICD-10-CM

## 2023-06-28 DIAGNOSIS — K922 Gastrointestinal hemorrhage, unspecified: Secondary | ICD-10-CM | POA: Insufficient documentation

## 2023-06-28 DIAGNOSIS — K254 Chronic or unspecified gastric ulcer with hemorrhage: Secondary | ICD-10-CM | POA: Diagnosis not present

## 2023-06-28 DIAGNOSIS — K921 Melena: Secondary | ICD-10-CM | POA: Insufficient documentation

## 2023-06-28 DIAGNOSIS — R112 Nausea with vomiting, unspecified: Secondary | ICD-10-CM | POA: Diagnosis not present

## 2023-06-28 DIAGNOSIS — R109 Unspecified abdominal pain: Secondary | ICD-10-CM

## 2023-06-28 DIAGNOSIS — Z7901 Long term (current) use of anticoagulants: Secondary | ICD-10-CM | POA: Insufficient documentation

## 2023-06-28 LAB — CBC WITH DIFFERENTIAL/PLATELET
Abs Immature Granulocytes: 0.03 10*3/uL (ref 0.00–0.07)
Basophils Absolute: 0 10*3/uL (ref 0.0–0.1)
Basophils Relative: 1 %
Eosinophils Absolute: 0 10*3/uL (ref 0.0–0.5)
Eosinophils Relative: 1 %
HCT: 40 % (ref 36.0–46.0)
Hemoglobin: 13.6 g/dL (ref 12.0–15.0)
Immature Granulocytes: 0 %
Lymphocytes Relative: 15 %
Lymphs Abs: 1.4 10*3/uL (ref 0.7–4.0)
MCH: 32.2 pg (ref 26.0–34.0)
MCHC: 34 g/dL (ref 30.0–36.0)
MCV: 94.6 fL (ref 80.0–100.0)
Monocytes Absolute: 1 10*3/uL (ref 0.1–1.0)
Monocytes Relative: 11 %
Neutro Abs: 6.4 10*3/uL (ref 1.7–7.7)
Neutrophils Relative %: 72 %
Platelets: 225 10*3/uL (ref 150–400)
RBC: 4.23 MIL/uL (ref 3.87–5.11)
RDW: 12.8 % (ref 11.5–15.5)
WBC: 8.8 10*3/uL (ref 4.0–10.5)
nRBC: 0 % (ref 0.0–0.2)

## 2023-06-28 LAB — PROTIME-INR
INR: 1.4 — ABNORMAL HIGH (ref 0.8–1.2)
Prothrombin Time: 16.9 s — ABNORMAL HIGH (ref 11.4–15.2)

## 2023-06-28 LAB — TROPONIN I (HIGH SENSITIVITY)
Troponin I (High Sensitivity): 7 ng/L (ref <18)
Troponin I (High Sensitivity): 7 ng/L (ref <18)

## 2023-06-28 LAB — COMPREHENSIVE METABOLIC PANEL WITH GFR
ALT: 20 U/L (ref 0–44)
AST: 22 U/L (ref 15–41)
Albumin: 3.6 g/dL (ref 3.5–5.0)
Alkaline Phosphatase: 50 U/L (ref 38–126)
Anion gap: 11 (ref 5–15)
BUN: 17 mg/dL (ref 8–23)
CO2: 28 mmol/L (ref 22–32)
Calcium: 9.3 mg/dL (ref 8.9–10.3)
Chloride: 101 mmol/L (ref 98–111)
Creatinine, Ser: 0.93 mg/dL (ref 0.44–1.00)
GFR, Estimated: >60 mL/min (ref >60)
Glucose, Bld: 126 mg/dL — ABNORMAL HIGH (ref 70–99)
Potassium: 3.4 mmol/L — ABNORMAL LOW (ref 3.5–5.1)
Sodium: 140 mmol/L (ref 135–145)
Total Bilirubin: 1 mg/dL (ref 0.0–1.2)
Total Protein: 6.1 g/dL — ABNORMAL LOW (ref 6.5–8.1)

## 2023-06-28 LAB — URINALYSIS, ROUTINE W REFLEX MICROSCOPIC
Bilirubin Urine: NEGATIVE
Glucose, UA: NEGATIVE mg/dL
Hgb urine dipstick: NEGATIVE
Ketones, ur: 5 mg/dL — AB
Leukocytes,Ua: NEGATIVE
Nitrite: NEGATIVE
Protein, ur: NEGATIVE mg/dL
Specific Gravity, Urine: >1.046 — ABNORMAL HIGH (ref 1.005–1.030)
pH: 6 (ref 5.0–8.0)

## 2023-06-28 LAB — LACTIC ACID, PLASMA
Lactic Acid, Venous: 1.5 mmol/L (ref 0.5–1.9)
Lactic Acid, Venous: 1.5 mmol/L (ref 0.5–1.9)

## 2023-06-28 LAB — LIPASE, BLOOD: Lipase: 24 U/L (ref 11–51)

## 2023-06-28 MED ORDER — SUCRALFATE 1 GM/10ML PO SUSP
1.0000 g | Freq: Three times a day (TID) | ORAL | Status: DC
  Administered 2023-06-28 (×2): 1 g via ORAL
  Filled 2023-06-28 (×2): qty 10

## 2023-06-28 MED ORDER — OCTREOTIDE ACETATE 500 MCG/ML IJ SOLN
INTRAMUSCULAR | Status: AC
  Filled 2023-06-28: qty 1

## 2023-06-28 MED ORDER — FAMOTIDINE 40 MG PO TABS: 40.0000 mg | ORAL_TABLET | Freq: Every day | ORAL | 1 refills | Status: DC

## 2023-06-28 MED ORDER — IOHEXOL 350 MG/ML SOLN
100.0000 mL | Freq: Once | INTRAVENOUS | Status: AC | PRN
  Administered 2023-06-28: 100 mL via INTRAVENOUS

## 2023-06-28 MED ORDER — POTASSIUM CHLORIDE 20 MEQ PO PACK
40.0000 meq | PACK | Freq: Two times a day (BID) | ORAL | Status: DC
  Administered 2023-06-28: 40 meq via ORAL
  Filled 2023-06-28: qty 2

## 2023-06-28 MED ORDER — ONDANSETRON HCL 4 MG/2ML IJ SOLN
4.0000 mg | Freq: Once | INTRAMUSCULAR | Status: AC
  Administered 2023-06-28: 4 mg via INTRAVENOUS
  Filled 2023-06-28: qty 2

## 2023-06-28 MED ORDER — FAMOTIDINE 20 MG PO TABS
40.0000 mg | ORAL_TABLET | Freq: Once | ORAL | Status: AC
  Administered 2023-06-28: 40 mg via ORAL
  Filled 2023-06-28: qty 2

## 2023-06-28 MED ORDER — PANTOPRAZOLE SODIUM 40 MG PO TBEC: 40.0000 mg | DELAYED_RELEASE_TABLET | Freq: Two times a day (BID) | ORAL | 3 refills | Status: DC

## 2023-06-28 MED ORDER — OCTREOTIDE LOAD VIA INFUSION
50.0000 ug | Freq: Once | INTRAVENOUS | Status: DC
  Filled 2023-06-28: qty 25

## 2023-06-28 MED ORDER — SODIUM CHLORIDE 0.9 % IV BOLUS
1000.0000 mL | Freq: Once | INTRAVENOUS | Status: AC
  Administered 2023-06-28: 1000 mL via INTRAVENOUS

## 2023-06-28 MED ORDER — PANTOPRAZOLE SODIUM 40 MG IV SOLR
40.0000 mg | Freq: Once | INTRAVENOUS | Status: AC
  Administered 2023-06-28: 40 mg via INTRAVENOUS
  Filled 2023-06-28: qty 10

## 2023-06-28 MED ORDER — SUCRALFATE 1 G PO TABS: 2.0000 g | ORAL_TABLET | Freq: Three times a day (TID) | ORAL | 0 refills | Status: DC

## 2023-06-28 MED ORDER — SODIUM CHLORIDE 0.9 % IV SOLN
50.0000 ug/h | INTRAVENOUS | Status: DC
  Filled 2023-06-28 (×2): qty 1

## 2023-06-28 MED ORDER — KETOROLAC TROMETHAMINE 15 MG/ML IJ SOLN
15.0000 mg | Freq: Once | INTRAMUSCULAR | Status: AC
  Administered 2023-06-28: 15 mg via INTRAVENOUS
  Filled 2023-06-28: qty 1

## 2023-06-28 NOTE — ED Notes (Signed)
 AC to bring Sandostatin from main pharmacy

## 2023-06-28 NOTE — ED Notes (Signed)
 Hospitalist at bedside to reassess pt.

## 2023-06-28 NOTE — Consult Note (Signed)
 Medical Consultation   Darlene [name]  ZOX:[id number]  DOB: 06-05-1940  DOA: 06/28/2023  PCP: Park Meo, FNP   Outpatient Specialists: [facility]  History of Present Illness: Darlene [name] is an 83 y.o. female with history of GERD, Esophageal stricture, hiatal hernia who presents emergency department due to abd pain.  Patient has been endorsing epigastric pain with nausea and poor p.o. intake.  She has been having intermittent GI symptoms over the last year and has been followed by gastroenterology in the outpatient.  She was planning on having an endoscopy this upcoming Wednesday for further assessment.  She has a history of GERD and a hiatal hernia.  Her last EGD was in 2019 which showed stenosis at the GE junction which was dilated.  On presentation to the department she was afebrile and hemodynamically stable.  Labs were obtained which showed lactic acid 1.5, lipase 24, potassium 3.4, WBC 8.8, hemoglobin 13.6, INR 1.4.  Troponins within normal limits.  CT abdomen showed a large hiatal hernia.  Chest x-ray showed no acute findings.  Hospice were consulted for admission.  On admission I explained to her that her hemoglobin has been stable.  Etiology of her presentation is likely related to esophagitis or Cameron's lesions in the setting of hiatal hernia.  She expressed desire to follow-up outpatient with planned EGD on Wednesday and initiation of Carafate and Pepcid.     Review of Systems:   As per HPI otherwise 10 point review of systems negative.    Review of Systems  Constitutional: Negative.   HENT: Negative.    Eyes: Negative.   Respiratory: Negative.    Gastrointestinal:  Positive for abdominal pain, nausea and vomiting.  Genitourinary: Negative.   Musculoskeletal: Negative.   Skin: Negative.   Hematological: Negative.   Psychiatric/Behavioral: Negative.    All other systems reviewed and are negative.  Past Medical History: Past Medical History:   Diagnosis Date   Anxiety    Chronic lower back pain    Depression    DJD (degenerative joint disease)    knees, neck   GERD (gastroesophageal reflux disease)    has had dilitation in the past   Hypertension     Past Surgical History: Past Surgical History:  Procedure Laterality Date   ABDOMINAL HYSTERECTOMY  03/1979   BREAST BIOPSY Left 1986 and 1987   CATARACT EXTRACTION, BILATERAL Bilateral 2014   Dr. Leonette Monarch, in [place]   EYE SURGERY  2024   [facility]     Allergies:   Allergies  Allergen Reactions   Cephalosporins Rash   Keflex [Cephalexin] Rash     Social History:  reports that she has never smoked. She has never used smokeless tobacco. She reports current alcohol use of about 3.0 standard drinks of alcohol per week. She reports that she does not use drugs.   Family History: Family History  Problem Relation Age of Onset   Hypertension Mother    Heart disease Mother    Hypertension Father    Heart disease Father    Tremor Sister    Dementia Brother    Hypertension Daughter    Colon cancer Neg Hx    Esophageal cancer Neg Hx    Liver disease Neg Hx     Unacceptable: Noncontributory, unremarkable, or negative. Acceptable: Family history reviewed and not pertinent (If you reviewed it)   Physical  Exam: Vitals:   06/28/23 1950 06/28/23 2045 06/28/23 2100 06/28/23 2145  BP: 120/60 135/70 (!) 141/73 (!) 122/55  Pulse:  81 88 82  Resp: 19 (!) 27 (!) 23 (!) 23  Temp:      TempSrc:      SpO2:  93% 94% 91%  Weight:      Height:        Constitutional: Alert and awake, oriented x3, not in any acute distress. Eyes: PERLA, EOMI, irises appear normal, anicteric sclera,  ENMT: external ears and nose appear normal            Lips appears normal, oropharynx mucosa, tongue, posterior pharynx appear normal  Neck: neck appears normal, no masses, normal ROM, no thyromegaly, no JVD  CVS: S1-S2 clear, no murmur rubs or gallops, no LE  edema, normal pedal pulses  Respiratory:  clear to auscultation bilaterally, no wheezing, rales or rhonchi. Respiratory effort normal. No accessory muscle use.  Abdomen: soft nontender, nondistended, normal bowel sounds, no hepatosplenomegaly, no hernias  Musculoskeletal: : no cyanosis, clubbing or edema noted bilaterally Neuro: Cranial nerves II-XII intact, strength, sensation, reflexes Psych: judgement and insight appear normal, stable mood and affect, mental status Skin: no rashes or lesions or ulcers, no induration or nodules   Data reviewed:  I have personally reviewed following labs and imaging studies Labs:  CBC: Recent Labs  Lab 06/28/23 1806  WBC 8.8  NEUTROABS 6.4  HGB 13.6  HCT 40.0  MCV 94.6  PLT 225    Basic Metabolic Panel: Recent Labs  Lab 06/28/23 1806  NA 140  K 3.4*  CL 101  CO2 28  GLUCOSE 126*  BUN 17  CREATININE 0.93  CALCIUM 9.3   GFR Estimated Creatinine Clearance: 59.8 mL/min (by C-G formula based on SCr of 0.93 mg/dL). Liver Function Tests: Recent Labs  Lab 06/28/23 1806  AST 22  ALT 20  ALKPHOS 50  BILITOT 1.0  PROT 6.1*  ALBUMIN 3.6   Recent Labs  Lab 06/28/23 1806  LIPASE 24   No results for input(s): "AMMONIA" in the last 168 hours. Coagulation profile Recent Labs  Lab 06/28/23 1806  INR 1.4*    Cardiac Enzymes: No results for input(s): "CKTOTAL", "CKMB", "CKMBINDEX", "TROPONINI" in the last 168 hours. BNP: Invalid input(s): "POCBNP" CBG: No results for input(s): "GLUCAP" in the last 168 hours. D-Dimer No results for input(s): "DDIMER" in the last 72 hours. Hgb A1c No results for input(s): "HGBA1C" in the last 72 hours. Lipid Profile No results for input(s): "CHOL", "HDL", "LDLCALC", "TRIG", "CHOLHDL", "LDLDIRECT" in the last 72 hours. Thyroid function studies No results for input(s): "TSH", "T4TOTAL", "T3FREE", "THYROIDAB" in the last 72 hours.  Invalid input(s): "FREET3" Anemia work up No results for  input(s): "VITAMINB12", "FOLATE", "FERRITIN", "TIBC", "IRON", "RETICCTPCT" in the last 72 hours. Urinalysis    Component Value Date/Time   COLORURINE YELLOW 06/28/2023 2030   APPEARANCEUR CLEAR 06/28/2023 2030   LABSPEC >1.046 (H) 06/28/2023 2030   PHURINE 6.0 06/28/2023 2030   GLUCOSEU NEGATIVE 06/28/2023 2030   HGBUR NEGATIVE 06/28/2023 2030   BILIRUBINUR NEGATIVE 06/28/2023 2030   KETONESUR 5 (A) 06/28/2023 2030   PROTEINUR NEGATIVE 06/28/2023 2030   NITRITE NEGATIVE 06/28/2023 2030   LEUKOCYTESUR NEGATIVE 06/28/2023 2030     Sepsis Labs Recent Labs  Lab 06/28/23 1806  WBC 8.8   Microbiology No results found for this or any previous visit (from the past 240 hours).     Inpatient Medications:   Scheduled  Meds:  famotidine  40 mg Oral Once   potassium chloride  40 mEq Oral BID   sucralfate  1 g Oral TID WC & HS   Continuous Infusions:   Radiological Exams on Admission: CT ANGIO GI BLEED Result Date: 06/28/2023 CLINICAL DATA:  Upper abdominal pain, evaluate for GI bleeding EXAM: CTA ABDOMEN AND PELVIS WITHOUT AND WITH CONTRAST TECHNIQUE: Multidetector CT imaging of the abdomen and pelvis was performed using the standard protocol during bolus administration of intravenous contrast. Multiplanar reconstructed images and MIPs were obtained and reviewed to evaluate the vascular anatomy. RADIATION DOSE REDUCTION: This exam was performed according to the departmental dose-optimization program which includes automated exposure control, adjustment of the mA and/or kV according to patient size and/or use of iterative reconstruction technique. CONTRAST:  OMNIPAQUE IOHEXOL 350 MG/ML SOLN COMPARISON:  05/31/2023 FINDINGS: VASCULAR Aorta: Atherosclerotic calcifications of the aorta are noted without aneurysmal dilatation or dissection. 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 Collateral flow from the SMA to the celiac axis branches is noted. SMA: Within  normal limits. Renals: Both renal arteries are patent without evidence of aneurysm, dissection, vasculitis, fibromuscular dysplasia or significant stenosis. IMA: Patent without evidence of aneurysm, dissection, vasculitis or significant stenosis. Inflow: Iliacs are within normal limits. Veins: No specific venous abnormality is noted. Review of the MIP images confirms the above findings. NON-VASCULAR Lower chest: Lung bases demonstrate compressive atelectasis in the right lower lobe secondary to large hiatal hernia. Hepatobiliary: Liver shows multiple cystic lesions which are stable in appearance from the prior exam. Gallbladder is well distended with stones within. Common bile duct has increased in size when compared with the prior exam. This measures up to 14 mm. There is a suggestion of a tiny distal stone in the coronal image number 39 of series 7. Correlation with laboratory values is recommended. Pancreas: Unremarkable. No pancreatic ductal dilatation or surrounding inflammatory changes. Spleen: Normal in size without focal abnormality. Adrenals/Urinary Tract: Adrenal glands are within normal limits. Kidneys demonstrate a normal enhancement pattern bilaterally. An exophytic fatty lesion from the inferior aspect of the right kidney is again seen and stable measuring up to 4.1 cm. This is consistent with a angiomyolipoma. The bladder is decompressed. Stomach/Bowel: Large hiatal hernia is identified within the right chest. Scattered diverticular change of the colon is seen. No obstructive or inflammatory changes of the colon are noted. A loop of ascending colon extends through a anterior abdominal wall spigelian hernia lateral to the right rectus muscle. This is stable in appearance from the prior exam. No obstructive changes are seen. Small bowel is within normal limits. No pooling of contrast is identified to suggest GI hemorrhage. The appendix is not well visualized. No inflammatory changes to suggest  appendicitis are noted. Lymphatic: No lymphadenopathy is noted. Reproductive: Status post hysterectomy. No adnexal masses. Other: No abdominal wall hernia or abnormality. No abdominopelvic ascites. Musculoskeletal: No acute or significant osseous findings. IMPRESSION: VASCULAR No evidence of active GI hemorrhage. 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 Large hiatal hernia. Diverticulosis without diverticulitis. Electronically Signed   By: Violeta Grey M.D.   On: 06/28/2023 20:41   DG Chest Port 1 View Result Date: 06/28/2023 CLINICAL DATA:  Upper abdominal pain, sepsis EXAM: PORTABLE CHEST 1 VIEW COMPARISON:  08/31/2022 FINDINGS: Large density in the right lower chest favoring hiatal hernia and adjacent passive atelectasis. Reverse lordotic projection. Upper normal heart size. The  left lung appears clear. Atherosclerotic calcification of the aortic arch. IMPRESSION: 1. Large hiatal hernia with adjacent passive atelectasis in the right lower chest. 2. Upper normal heart size. 3. Aortic Atherosclerosis (ICD10-I70.0). Electronically Signed   By: Freida Jes M.D.   On: 06/28/2023 20:17    Impression/Recommendations  # Nausea vomiting/dysphagia - Patient follows with [facility] and has plans for EGD on Wednesday while holding Eliquis - Long-term weeks of poor p.o. intake - Worsening nausea vomiting and difficulty taking medications - Explained to patient that she will likely have to wait till Wednesday due to holding her Eliquis to perform EGD. - Patient's hemoglobin is stable at 13.6 - Patient compliant with Protonix at home - Patient elected to defer outpatient management with planned EGD on Wednesday  Plan: Follow-up with  GI on Wednesday for EGD with possible dilation Twice daily PPI.  Patient was instructed to take medication tenderness before meals 40 mg of Pepcid at  night Every 6 hours Carafate suspension Patient was given strict return precautions to return to emergency department if develops refractory vomiting and inability to tolerate p.o.  # Hypokalemia-patient repleted   Thank you for this consultation.  Our [facility] hospitalist team will follow the patient with you.   Time Spent: 60 min  Myrl Askew M.D. [facility] 06/28/2023, 10:37 PM

## 2023-06-28 NOTE — ED Notes (Signed)
 ED Provider at bedside.

## 2023-06-28 NOTE — ED Notes (Addendum)
Pt unable to tolerate PO medications.

## 2023-06-28 NOTE — ED Triage Notes (Signed)
 Pt c/o upper abdominal pain that wraps around to pt's back starting a week ago. Daughter states today pt was curled over in pain. Per daughter pt has had vomiting, diarrhea, and nausea since Thursday. Pt is scheduled for endoscopy on Wednesday.

## 2023-06-28 NOTE — ED Provider Notes (Signed)
 [place] EMERGENCY DEPARTMENT AT [facility] Provider Note   CSN: [id number] Arrival date & time: 06/28/23  1640     History  Chief Complaint  Patient presents with   Abdominal Pain    BLESSEN KIMBROUGH is a 83 y.o. female.  Patient is an 83 year old female with past medical history of GERD, esophageal strictures, and hiatal hernias senting for abdominal pain.  Patient admits to " band-like" pain sensation wrapping across her mid abdomen that is severe, intermittent, associated with nausea and diarrhea, and without radiation.  She states the symptoms have been on and off for the past year but usually only occurs once a month.  She states it has been happening multiple times over the past week and has been constant over the past day.  Patient also admits to a history of dark stools as well as hematochezia that she attributes to internal and external hemorrhoids.  Throughout stay in emergency department she had an episode of black emesis.  It was heme positive.  After further questioning she states she does admit to minimal amount of coffee ground emesis that 2 days ago.  No prior history of upper GI bleed.  Previous history of esophageal dilations for strictures.  No alcohol use.  No Motrin use.  Patient is on Eliquis for A-fib.  Patient is currently holding her Eliquis-did not get her dose today because she has an upper endoscopy for esophageal stricture and dilation scheduled in 2 days.  The history is provided by the patient. No language interpreter was used.  Abdominal Pain Associated symptoms: nausea and vomiting   Associated symptoms: no chest pain, no chills, no cough, no dysuria, no fever, no hematuria, no shortness of breath and no sore throat        Home Medications Prior to Admission medications   Medication Sig Start Date End Date Taking? Authorizing Provider  acetaminophen (TYLENOL) 500 MG tablet Take 500 mg by mouth every 6 (six) hours as needed for mild pain  (pain score 1-3).   Yes [provider]  albuterol (VENTOLIN HFA) 108 (90 Base) MCG/ACT inhaler Inhale 2 puffs into the lungs every 6 (six) hours as needed for wheezing or shortness of breath. 05/22/22  Yes Cranford, Archie Patten, NP  apixaban (ELIQUIS) 5 MG TABS tablet TAKE 1 TABLET BY MOUTH TWICE  DAILY 06/07/23  Yes Branch, Dorothe Pea, MD  Ascorbic Acid (VITAMIN C) 500 MG CAPS Take 500 mg by mouth.    Yes [provider]  busPIRone (BUSPAR) 10 MG tablet TAKE 1/2 TO 1 TABLET BY MOUTH 3  TIMES DAILY AS NEEDED FOR  ANXIETY, MOOD OR IRRITABILITY 10/22/22  Yes Raynelle Dick, FNP  CHOLECALCIFEROL PO Take 5,000 Units by mouth daily.   Yes [provider]  Cyanocobalamin 2500 MCG TABS Take 2,500 mcg by mouth once a week.   Yes [provider]  cyclobenzaprine (FLEXERIL) 10 MG tablet Take 1/2 to 1 tablet    2 to 3 x /day if needed for Muscle Spasms Patient taking differently: Take 5-10 mg by mouth 3 (three) times daily as needed for muscle spasms. Take 1/2 to 1 tablet    2 to 3 x /day if needed for Muscle Spasms 04/25/19  Yes Lucky Cowboy, MD  diltiazem (CARDIZEM CD) 300 MG 24 hr capsule TAKE 1 CAPSULE BY MOUTH DAILY 10/22/22  Yes Branch, Dorothe Pea, MD  docusate sodium (COLACE) 100 MG capsule Take 100 mg by mouth daily as needed for mild constipation.  Yes [provider]  escitalopram (LEXAPRO) 20 MG tablet TAKE 1 TABLET BY MOUTH DAILY FOR MOOD, ANXIETY &amp; IRRITABILITY 10/10/22  Yes Raynelle Dick, FNP  ferrous sulfate 325 (65 FE) MG tablet Take 1 tablet (325 mg total) by mouth every other day. 01/15/20  Yes [name], NP  Magnesium 250 MG TABS Take 1 tablet by mouth daily.   Yes [provider]  metoprolol tartrate (LOPRESSOR) 25 MG tablet TAKE 2 TABLETS BY MOUTH IN THE  MORNING AND 1 TABLET BY MOUTH IN THE EVENING Patient taking differently: Take 25-50 mg by mouth 2 (two) times daily. TAKE 2 TABLETS BY MOUTH IN THE  MORNING AND 1 TABLET BY MOUTH  IN THE EVENING 05/05/23  Yes Branch, Dorothe Pea, MD  nitroGLYCERIN (NITROSTAT) 0.4 MG SL tablet Place 1 tablet (0.4 mg total) under the tongue every 5 (five) minutes as needed for chest pain. 02/05/20  Yes Strader, [place] M, PA-C  pantoprazole (PROTONIX) 40 MG tablet Take  1 tablet  Daily  to prevent  Indigestion & Acid Reflux 03/03/23  Yes Lucky Cowboy, MD  Probiotic Product (PROBIOTIC DAILY PO) Take 1 tablet by mouth daily.   Yes [provider]  zinc gluconate 50 MG tablet Take 50 mg by mouth daily.   Yes [provider]      Allergies    Cephalosporins and Keflex [cephalexin]    Review of Systems   Review of Systems  Constitutional:  Negative for chills and fever.  HENT:  Negative for ear pain and sore throat.   Eyes:  Negative for pain and visual disturbance.  Respiratory:  Negative for cough and shortness of breath.   Cardiovascular:  Negative for chest pain and palpitations.  Gastrointestinal:  Positive for abdominal pain, blood in stool, nausea and vomiting.  Genitourinary:  Negative for dysuria and hematuria.  Musculoskeletal:  Negative for arthralgias and back pain.  Skin:  Negative for color change and rash.  Neurological:  Negative for seizures and syncope.  All other systems reviewed and are negative.   Physical Exam Updated Vital Signs BP 135/70   Pulse 81   Temp (!) 96.7 F (35.9 C) (Tympanic)   Resp (!) 27   Ht 5\' 10"  (1.778 m)   Wt 103.7 kg   SpO2 93%   BMI 32.79 kg/m  Physical Exam Vitals and nursing note reviewed.  Constitutional:      General: She is not in acute distress.    Appearance: She is well-developed.  HENT:     Head: Normocephalic and atraumatic.  Eyes:     Conjunctiva/sclera: Conjunctivae normal.  Cardiovascular:     Rate and Rhythm: Normal rate and regular rhythm.     Heart sounds: No murmur heard. Pulmonary:     Effort: Pulmonary effort is normal. No respiratory distress.     Breath sounds: Normal breath  sounds.  Abdominal:     Palpations: Abdomen is soft.     Tenderness: There is generalized abdominal tenderness. There is no guarding or rebound.  Musculoskeletal:        General: No swelling.     Cervical back: Neck supple.  Skin:    General: Skin is warm and dry.     Capillary Refill: Capillary refill takes less than 2 seconds.  Neurological:     Mental Status: She is alert.  Psychiatric:        Mood and Affect: Mood normal.     ED Results / Procedures / Treatments  Labs (all labs ordered are listed, but only abnormal results are displayed) Labs Reviewed  COMPREHENSIVE METABOLIC PANEL WITH GFR - Abnormal; Notable for the following components:      Result Value   Potassium 3.4 (*)    Glucose, Bld 126 (*)    Total Protein 6.1 (*)    All other components within normal limits  URINALYSIS, ROUTINE W REFLEX MICROSCOPIC - Abnormal; Notable for the following components:   Specific Gravity, Urine >1.046 (*)    Ketones, ur 5 (*)    All other components within normal limits  PROTIME-INR - Abnormal; Notable for the following components:   Prothrombin Time 16.9 (*)    INR 1.4 (*)    All other components within normal limits  CULTURE, BLOOD (ROUTINE X 2)  CULTURE, BLOOD (ROUTINE X 2)  LIPASE, BLOOD  LACTIC ACID, PLASMA  LACTIC ACID, PLASMA  CBC WITH DIFFERENTIAL/PLATELET  TROPONIN I (HIGH SENSITIVITY)  TROPONIN I (HIGH SENSITIVITY)    EKG None  Radiology CT ANGIO GI BLEED Result Date: 06/28/2023 CLINICAL DATA:  Upper abdominal pain, evaluate for GI bleeding EXAM: CTA ABDOMEN AND PELVIS WITHOUT AND WITH CONTRAST TECHNIQUE: Multidetector CT imaging of the abdomen and pelvis was performed using the standard protocol during bolus administration of intravenous contrast. Multiplanar reconstructed images and MIPs were obtained and reviewed to evaluate the vascular anatomy. RADIATION DOSE REDUCTION: This exam was performed according to the departmental dose-optimization program which  includes automated exposure control, adjustment of the mA and/or kV according to patient size and/or use of iterative reconstruction technique. CONTRAST:  OMNIPAQUE IOHEXOL 350 MG/ML SOLN COMPARISON:  05/31/2023 FINDINGS: VASCULAR Aorta: Atherosclerotic calcifications of the aorta are noted without aneurysmal dilatation or dissection. 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 Collateral flow from the SMA to the celiac axis branches is noted. SMA: Within normal limits. Renals: Both renal arteries are patent without evidence of aneurysm, dissection, vasculitis, fibromuscular dysplasia or significant stenosis. IMA: Patent without evidence of aneurysm, dissection, vasculitis or significant stenosis. Inflow: Iliacs are within normal limits. Veins: No specific venous abnormality is noted. Review of the MIP images confirms the above findings. NON-VASCULAR Lower chest: Lung bases demonstrate compressive atelectasis in the right lower lobe secondary to large hiatal hernia. Hepatobiliary: Liver shows multiple cystic lesions which are stable in appearance from the prior exam. Gallbladder is well distended with stones within. Common bile duct has increased in size when compared with the prior exam. This measures up to 14 mm. There is a suggestion of a tiny distal stone in the coronal image number 39 of series 7. Correlation with laboratory values is recommended. Pancreas: Unremarkable. No pancreatic ductal dilatation or surrounding inflammatory changes. Spleen: Normal in size without focal abnormality. Adrenals/Urinary Tract: Adrenal glands are within normal limits. Kidneys demonstrate a normal enhancement pattern bilaterally. An exophytic fatty lesion from the inferior aspect of the right kidney is again seen and stable measuring up to 4.1 cm. This is consistent with a angiomyolipoma. The bladder is decompressed. Stomach/Bowel: Large hiatal hernia is identified within the right chest. Scattered  diverticular change of the colon is seen. No obstructive or inflammatory changes of the colon are noted. A loop of ascending colon extends through a anterior abdominal wall spigelian hernia lateral to the right rectus muscle. This is stable in appearance from the prior exam. No obstructive changes are seen. Small bowel is within normal limits. No pooling of contrast is identified to suggest GI hemorrhage. The appendix is not well  visualized. No inflammatory changes to suggest appendicitis are noted. Lymphatic: No lymphadenopathy is noted. Reproductive: Status post hysterectomy. No adnexal masses. Other: No abdominal wall hernia or abnormality. No abdominopelvic ascites. Musculoskeletal: No acute or significant osseous findings. IMPRESSION: VASCULAR No evidence of active GI hemorrhage. 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 Large hiatal hernia. Diverticulosis without diverticulitis. Electronically Signed   By: Alcide Clever M.D.   On: 06/28/2023 20:41   DG Chest Port 1 View Result Date: 06/28/2023 CLINICAL DATA:  Upper abdominal pain, sepsis EXAM: PORTABLE CHEST 1 VIEW COMPARISON:  08/31/2022 FINDINGS: Large density in the right lower chest favoring hiatal hernia and adjacent passive atelectasis. Reverse lordotic projection. Upper normal heart size. The left lung appears clear. Atherosclerotic calcification of the aortic arch. IMPRESSION: 1. Large hiatal hernia with adjacent passive atelectasis in the right lower chest. 2. Upper normal heart size. 3. Aortic Atherosclerosis (ICD10-I70.0). Electronically Signed   By: Gaylyn Rong M.D.   On: 06/28/2023 20:17    Procedures .Critical Care  Performed by: Franne Forts, DO Authorized by: Franne Forts, DO   Critical care provider statement:    Critical care time (minutes):  102   Critical care was time spent personally by me on the following  activities:  Development of treatment plan with patient or surrogate, discussions with consultants, evaluation of patient's response to treatment, examination of patient, ordering and review of laboratory studies, ordering and review of radiographic studies, ordering and performing treatments and interventions, pulse oximetry, re-evaluation of patient's condition and review of old charts   Care discussed with: admitting provider     Care discussed with comment:  Gi consult     Medications Ordered in ED Medications  sodium chloride 0.9 % bolus 1,000 mL (0 mLs Intravenous Stopped 06/28/23 1927)  ondansetron (ZOFRAN) injection 4 mg (4 mg Intravenous Given 06/28/23 1822)  ketorolac (TORADOL) 15 MG/ML injection 15 mg (15 mg Intravenous Given 06/28/23 1822)  iohexol (OMNIPAQUE) 350 MG/ML injection 100 mL (100 mLs Intravenous Contrast Given 06/28/23 1929)  pantoprazole (PROTONIX) injection 40 mg (40 mg Intravenous Given 06/28/23 2023)  ondansetron (ZOFRAN) injection 4 mg (4 mg Intravenous Given 06/28/23 2024)    ED Course/ Medical Decision Making/ A&P                                 Medical Decision Making Amount and/or Complexity of Data Reviewed Labs: ordered. Radiology: ordered.  Risk Prescription drug management. Decision regarding hospitalization.   59:8 PM 83 year old female with past medical history of GERD, esophageal strictures, and hiatal hernias senting for abdominal pain.  Patient is alert and oriented x 3, hemodynamically stable with a blood pressure of 135/70 currently.  Hemoglobin is stable at 13.6.   Throughout stay in emergency department she had an episode of black emesis.  It was heme positive.  After further questioning she states she does admit to minimal amount of coffee ground emesis that 2 days ago.  No prior history of upper GI bleed.  Previous history of esophageal dilations for strictures.  No alcohol use.  No Motrin use.  Patient is on Eliquis for A-fib.   Patient is  currently holding her Eliquis-did not get her dose today because she has an upper endoscopy for esophageal stricture and dilation scheduled in 2 daysProtonix ordered.  CT angio GI bleed demonstrates: Right renal  angiomyolipoma is stable in appearance. Large hiatal hernia. Diverticulosis without diverticulitis.  Spoke with GI specialist Dr. Mordechai April who agrees to follow patient.  I spoke with admitting team who agrees to accept patient for admission.          Final Clinical Impression(s) / ED Diagnoses Final diagnoses:  Upper GI bleed  Melena  Abdominal pain, unspecified abdominal location  Hematochezia  External hemorrhoids    Rx / DC Orders ED Discharge Orders     None         Quinn Bucco, DO 06/28/23 2104

## 2023-06-28 NOTE — Progress Notes (Deleted)
 [facility] History and Physical   Primary Care Physician:  Park Meo, FNP   Reason for Procedure:  GERD, dysphagia, hiatal hernia  Plan:    EGD   HPI: Darlene [name] is a 83 y.o. female undergoing EGD for evaluation of GERD, dysphagia and hiatal hernia.  Endorses solid food dysphagia-able to swallow liquids adequately.  Sometimes has difficulty with pills.  Occasional odynophagia with swallowing.  No nausea or vomiting.  She has required esophageal dilation in the past.  EGD in 2016 Savary dilated to 15 mm.  In 2018 balloon dilated to 18 mm.  Recent CT scan shows a large hiatal hernia/inverted intrathoracic stomach likely contributing to symptoms.  She has been seen by general surgery regarding this.   Past Medical History:  Diagnosis Date   Anxiety    Chronic lower back pain    Depression    DJD (degenerative joint disease)    knees, neck   GERD (gastroesophageal reflux disease)    has had dilitation in the past   Hypertension     Past Surgical History:  Procedure Laterality Date   ABDOMINAL HYSTERECTOMY  03/1979   BREAST BIOPSY Left 1986 and 1987   CATARACT EXTRACTION, BILATERAL Bilateral 2014   Dr. Leonette Monarch, in [place]   EYE [facility]  2024   [facility] [place]    Prior to Admission medications   Medication Sig Start Date End Date Taking? Authorizing Provider  acetaminophen (TYLENOL) 500 MG tablet Take 500 mg by mouth every 6 (six) hours as needed.    [provider]  albuterol (VENTOLIN HFA) 108 (90 Base) MCG/ACT inhaler Inhale 2 puffs into the lungs every 6 (six) hours as needed for wheezing or shortness of breath. 05/22/22   [name], NP  apixaban (ELIQUIS) 5 MG TABS tablet TAKE 1 TABLET BY MOUTH TWICE  DAILY 06/07/23   [name], MD  Ascorbic Acid (VITAMIN C) 500 MG CAPS Take 500 mg by mouth.     [provider]  busPIRone (BUSPAR) 10 MG tablet TAKE 1/2 TO 1 TABLET BY MOUTH 3  TIMES DAILY AS NEEDED FOR   ANXIETY, MOOD OR IRRITABILITY 10/22/22   Raynelle Dick, FNP  CHOLECALCIFEROL PO Take 5,000 Units by mouth daily.    [provider]  Cyanocobalamin 2500 MCG TABS Take 2,500 mcg by mouth once a week.    [provider]  cyclobenzaprine (FLEXERIL) 10 MG tablet Take 1/2 to 1 tablet    2 to 3 x /day if needed for Muscle Spasms Patient taking differently: Take 5-10 mg by mouth 3 (three) times daily as needed for muscle spasms. Take 1/2 to 1 tablet    2 to 3 x /day if needed for Muscle Spasms 04/25/19   Lucky Cowboy, MD  diltiazem (CARDIZEM CD) 300 MG 24 hr capsule TAKE 1 CAPSULE BY MOUTH DAILY 10/22/22   [name], MD  escitalopram (LEXAPRO) 20 MG tablet TAKE 1 TABLET BY MOUTH DAILY FOR MOOD, ANXIETY &amp; IRRITABILITY 10/10/22   Raynelle Dick, FNP  ferrous sulfate 325 (65 FE) MG tablet Take 1 tablet (325 mg total) by mouth every other day. 01/15/20   [name], NP  ipratropium-albuterol (DUONEB) 0.5-2.5 (3) MG/3ML SOLN USE 1 VIAL IN NEBULIZER EVERY 4 HOURS Patient taking differently: Take 3 mLs by nebulization every 4 (four) hours as needed (SOB/Wheezing). 04/02/22   Raynelle Dick, FNP  Magnesium 250 MG TABS Take 1 tablet by mouth  daily.    [provider]  metoprolol tartrate (LOPRESSOR) 25 MG tablet TAKE 2 TABLETS BY MOUTH IN THE  MORNING AND 1 TABLET BY MOUTH IN THE EVENING 05/05/23   Branch, Joyceann No, MD  nitroGLYCERIN (NITROSTAT) 0.4 MG SL tablet Place 1 tablet (0.4 mg total) under the tongue every 5 (five) minutes as needed for chest pain. 02/05/20   Strader, Dimple Francis, PA-C  pantoprazole (PROTONIX) 40 MG tablet Take  1 tablet  Daily  to prevent  Indigestion & Acid Reflux 03/03/23   Vangie Genet, MD  Probiotic Product (PROBIOTIC DAILY PO) Take 1 tablet by mouth daily.    [provider]  psyllium (METAMUCIL) 58.6 % packet Take 1 packet by mouth daily.    [provider]  zinc gluconate 50 MG tablet Take 50 mg by mouth daily.     [provider]    Current Outpatient Medications  Medication Sig Dispense Refill   acetaminophen (TYLENOL) 500 MG tablet Take 500 mg by mouth every 6 (six) hours as needed.     albuterol (VENTOLIN HFA) 108 (90 Base) MCG/ACT inhaler Inhale 2 puffs into the lungs every 6 (six) hours as needed for wheezing or shortness of breath. 8 g 2   apixaban (ELIQUIS) 5 MG TABS tablet TAKE 1 TABLET BY MOUTH TWICE  DAILY 200 tablet 1   Ascorbic Acid (VITAMIN C) 500 MG CAPS Take 500 mg by mouth.      busPIRone (BUSPAR) 10 MG tablet TAKE 1/2 TO 1 TABLET BY MOUTH 3  TIMES DAILY AS NEEDED FOR  ANXIETY, MOOD OR IRRITABILITY 90 tablet 0   CHOLECALCIFEROL PO Take 5,000 Units by mouth daily.     Cyanocobalamin 2500 MCG TABS Take 2,500 mcg by mouth once a week.     cyclobenzaprine (FLEXERIL) 10 MG tablet Take 1/2 to 1 tablet    2 to 3 x /day if needed for Muscle Spasms (Patient taking differently: Take 5-10 mg by mouth 3 (three) times daily as needed for muscle spasms. Take 1/2 to 1 tablet    2 to 3 x /day if needed for Muscle Spasms) 90 tablet 0   diltiazem (CARDIZEM CD) 300 MG 24 hr capsule TAKE 1 CAPSULE BY MOUTH DAILY 100 capsule 2   escitalopram (LEXAPRO) 20 MG tablet TAKE 1 TABLET BY MOUTH DAILY FOR MOOD, ANXIETY &amp; IRRITABILITY 90 tablet 3   ferrous sulfate 325 (65 FE) MG tablet Take 1 tablet (325 mg total) by mouth every other day.     ipratropium-albuterol (DUONEB) 0.5-2.5 (3) MG/3ML SOLN USE 1 VIAL IN NEBULIZER EVERY 4 HOURS (Patient taking differently: Take 3 mLs by nebulization every 4 (four) hours as needed (SOB/Wheezing).) 180 mL 11   Magnesium 250 MG TABS Take 1 tablet by mouth daily.     metoprolol tartrate (LOPRESSOR) 25 MG tablet TAKE 2 TABLETS BY MOUTH IN THE  MORNING AND 1 TABLET BY MOUTH IN THE EVENING 270 tablet 3   nitroGLYCERIN (NITROSTAT) 0.4 MG SL tablet Place 1 tablet (0.4 mg total) under the tongue every 5 (five) minutes as needed for chest pain. 25 tablet 3   pantoprazole  (PROTONIX) 40 MG tablet Take  1 tablet  Daily  to prevent  Indigestion & Acid Reflux 90 tablet 3   Probiotic Product (PROBIOTIC DAILY PO) Take 1 tablet by mouth daily.     psyllium (METAMUCIL) 58.6 % packet Take 1 packet by mouth daily.     zinc gluconate 50 MG tablet Take 50  mg by mouth daily.     No current facility-administered medications for this visit.    Allergies as of 06/30/2023 - Review Complete 06/24/2023  Allergen Reaction Noted   Cephalosporins Rash 04/24/2019   Keflex [cephalexin] Rash 06/23/2023    Family History  Problem Relation Age of Onset   Hypertension Mother    Heart disease Mother    Hypertension Father    Heart disease Father    Tremor Sister    Dementia Brother    Hypertension Daughter    Colon cancer Neg Hx    Esophageal cancer Neg Hx    Liver disease Neg Hx     Social History   Socioeconomic History   Marital status: Widowed    Spouse name: Not on file   Number of children: 2   Years of education: Not on file   Highest education level: 12th grade  Occupational History   Occupation: retired    Comment: worked in Designer, fashion/clothing  Tobacco Use   Smoking status: Never   Smokeless tobacco: Never  Vaping Use   Vaping status: Never Used  Substance and Sexual Activity   Alcohol use: Yes    Alcohol/week: 3.0 standard drinks of alcohol    Types: 3 Standard drinks or equivalent per week    Comment: 3 nights out of the week   Drug use: No   Sexual activity: Not Currently  Other Topics Concern   Not on file  Social History Narrative   Not on file   Social Drivers of Health   Financial Resource Strain: Low Risk  (02/03/2021)   Overall Financial Resource Strain (CARDIA)    Difficulty of Paying Living Expenses: Not very hard  Food Insecurity: No Food Insecurity (07/17/2021)   Hunger Vital Sign    Worried About Running Out of Food in the Last Year: Never true    Ran Out of Food in the Last Year: Never true  Transportation Needs: No Transportation Needs  (07/17/2021)   PRAPARE - Administrator, Civil Service (Medical): No    Lack of Transportation (Non-Medical): No  Physical Activity: Inactive (04/28/2019)   Exercise Vital Sign    Days of Exercise per Week: 0 days    Minutes of Exercise per Session: 0 min  Stress: No Stress Concern Present (04/28/2019)   [employer] of Occupational Health - Occupational Stress Questionnaire    Feeling of Stress : Only a little  Social Connections: Moderately Integrated (04/28/2019)   Social Connection and Isolation Panel [NHANES]    Frequency of Communication with Friends and Family: More than three times a week    Frequency of Social Gatherings with Friends and Family: More than three times a week    Attends Religious Services: 1 to 4 times per year    Active Member of [employer] or Organizations: Yes    Attends Banker Meetings: More than 4 times per year    Marital Status: Widowed  Intimate Partner Violence: Not At Risk (04/28/2019)   Humiliation, Afraid, Rape, and Kick questionnaire    Fear of Current or Ex-Partner: No    Emotionally Abused: No    Physically Abused: No    Sexually Abused: No    Review of Systems:  All other review of systems negative except as mentioned in the HPI.  Physical Exam: Vital signs There were no vitals taken for this visit.  General:   Alert,  Well-developed, well-nourished, pleasant and cooperative in NAD Airway:  Mallampati  Lungs:  Clear throughout to auscultation.   Heart:  Regular rate and rhythm; no murmurs, clicks, rubs,  or gallops. Abdomen:  Soft, nontender and nondistended. Normal bowel sounds.   Neuro/Psych:  Normal mood and affect. A and O x 3  Eugenia Hess, MD [facility]

## 2023-06-28 NOTE — ED Notes (Signed)
 Patient transported to CT

## 2023-06-29 ENCOUNTER — Encounter (HOSPITAL_COMMUNITY): Payer: Self-pay | Admitting: Student

## 2023-06-29 ENCOUNTER — Telehealth: Payer: Self-pay | Admitting: Pediatrics

## 2023-06-29 ENCOUNTER — Inpatient Hospital Stay (HOSPITAL_COMMUNITY)
Admission: EM | Admit: 2023-06-29 | Discharge: 2023-07-29 | DRG: 326 | Disposition: A | Discharge status: Skilled Nursing Facility | Attending: Internal Medicine | Admitting: Internal Medicine

## 2023-06-29 ENCOUNTER — Other Ambulatory Visit: Payer: Self-pay

## 2023-06-29 DIAGNOSIS — Z7901 Long term (current) use of anticoagulants: Secondary | ICD-10-CM

## 2023-06-29 DIAGNOSIS — I4891 Unspecified atrial fibrillation: Secondary | ICD-10-CM

## 2023-06-29 DIAGNOSIS — K2970 Gastritis, unspecified, without bleeding: Secondary | ICD-10-CM | POA: Diagnosis present

## 2023-06-29 DIAGNOSIS — Z8249 Family history of ischemic heart disease and other diseases of the circulatory system: Secondary | ICD-10-CM

## 2023-06-29 DIAGNOSIS — T17908A Unspecified foreign body in respiratory tract, part unspecified causing other injury, initial encounter: Secondary | ICD-10-CM

## 2023-06-29 DIAGNOSIS — Z90710 Acquired absence of both cervix and uterus: Secondary | ICD-10-CM

## 2023-06-29 DIAGNOSIS — K5909 Other constipation: Secondary | ICD-10-CM | POA: Diagnosis present

## 2023-06-29 DIAGNOSIS — K920 Hematemesis: Secondary | ICD-10-CM

## 2023-06-29 DIAGNOSIS — G8929 Other chronic pain: Secondary | ICD-10-CM | POA: Diagnosis present

## 2023-06-29 DIAGNOSIS — Z860100 Personal history of colon polyps, unspecified: Secondary | ICD-10-CM

## 2023-06-29 DIAGNOSIS — Z881 Allergy status to other antibiotic agents status: Secondary | ICD-10-CM

## 2023-06-29 DIAGNOSIS — E785 Hyperlipidemia, unspecified: Secondary | ICD-10-CM | POA: Diagnosis present

## 2023-06-29 DIAGNOSIS — F05 Delirium due to known physiological condition: Secondary | ICD-10-CM | POA: Diagnosis not present

## 2023-06-29 DIAGNOSIS — K7689 Other specified diseases of liver: Secondary | ICD-10-CM | POA: Diagnosis present

## 2023-06-29 DIAGNOSIS — I272 Pulmonary hypertension, unspecified: Secondary | ICD-10-CM | POA: Diagnosis present

## 2023-06-29 DIAGNOSIS — E162 Hypoglycemia, unspecified: Secondary | ICD-10-CM | POA: Diagnosis not present

## 2023-06-29 DIAGNOSIS — F32A Depression, unspecified: Secondary | ICD-10-CM | POA: Diagnosis present

## 2023-06-29 DIAGNOSIS — K922 Gastrointestinal hemorrhage, unspecified: Secondary | ICD-10-CM | POA: Diagnosis not present

## 2023-06-29 DIAGNOSIS — K222 Esophageal obstruction: Secondary | ICD-10-CM | POA: Diagnosis present

## 2023-06-29 DIAGNOSIS — K660 Peritoneal adhesions (postprocedural) (postinfection): Secondary | ICD-10-CM | POA: Diagnosis present

## 2023-06-29 DIAGNOSIS — K254 Chronic or unspecified gastric ulcer with hemorrhage: Principal | ICD-10-CM | POA: Diagnosis present

## 2023-06-29 DIAGNOSIS — G4731 Primary central sleep apnea: Secondary | ICD-10-CM | POA: Diagnosis present

## 2023-06-29 DIAGNOSIS — R6521 Severe sepsis with septic shock: Secondary | ICD-10-CM | POA: Diagnosis not present

## 2023-06-29 DIAGNOSIS — D539 Nutritional anemia, unspecified: Secondary | ICD-10-CM | POA: Diagnosis present

## 2023-06-29 DIAGNOSIS — E8770 Fluid overload, unspecified: Secondary | ICD-10-CM | POA: Diagnosis present

## 2023-06-29 DIAGNOSIS — T182XXA Foreign body in stomach, initial encounter: Secondary | ICD-10-CM | POA: Diagnosis present

## 2023-06-29 DIAGNOSIS — E871 Hypo-osmolality and hyponatremia: Secondary | ICD-10-CM | POA: Diagnosis present

## 2023-06-29 DIAGNOSIS — E876 Hypokalemia: Secondary | ICD-10-CM | POA: Diagnosis present

## 2023-06-29 DIAGNOSIS — Z9842 Cataract extraction status, left eye: Secondary | ICD-10-CM

## 2023-06-29 DIAGNOSIS — K449 Diaphragmatic hernia without obstruction or gangrene: Secondary | ICD-10-CM

## 2023-06-29 DIAGNOSIS — R262 Difficulty in walking, not elsewhere classified: Secondary | ICD-10-CM | POA: Diagnosis not present

## 2023-06-29 DIAGNOSIS — E872 Acidosis, unspecified: Secondary | ICD-10-CM | POA: Diagnosis present

## 2023-06-29 DIAGNOSIS — J9602 Acute respiratory failure with hypercapnia: Secondary | ICD-10-CM | POA: Diagnosis present

## 2023-06-29 DIAGNOSIS — J9601 Acute respiratory failure with hypoxia: Secondary | ICD-10-CM | POA: Diagnosis present

## 2023-06-29 DIAGNOSIS — I482 Chronic atrial fibrillation, unspecified: Secondary | ICD-10-CM | POA: Diagnosis not present

## 2023-06-29 DIAGNOSIS — F419 Anxiety disorder, unspecified: Secondary | ICD-10-CM | POA: Diagnosis present

## 2023-06-29 DIAGNOSIS — E66811 Obesity, class 1: Secondary | ICD-10-CM | POA: Diagnosis present

## 2023-06-29 DIAGNOSIS — G4737 Central sleep apnea in conditions classified elsewhere: Secondary | ICD-10-CM | POA: Diagnosis not present

## 2023-06-29 DIAGNOSIS — R109 Unspecified abdominal pain: Secondary | ICD-10-CM | POA: Diagnosis not present

## 2023-06-29 DIAGNOSIS — A419 Sepsis, unspecified organism: Secondary | ICD-10-CM | POA: Diagnosis not present

## 2023-06-29 DIAGNOSIS — E870 Hyperosmolality and hypernatremia: Secondary | ICD-10-CM | POA: Diagnosis not present

## 2023-06-29 DIAGNOSIS — R112 Nausea with vomiting, unspecified: Principal | ICD-10-CM

## 2023-06-29 DIAGNOSIS — K440 Diaphragmatic hernia with obstruction, without gangrene: Principal | ICD-10-CM | POA: Diagnosis present

## 2023-06-29 DIAGNOSIS — G4733 Obstructive sleep apnea (adult) (pediatric): Secondary | ICD-10-CM | POA: Diagnosis present

## 2023-06-29 DIAGNOSIS — D72829 Elevated white blood cell count, unspecified: Secondary | ICD-10-CM | POA: Diagnosis not present

## 2023-06-29 DIAGNOSIS — J690 Pneumonitis due to inhalation of food and vomit: Secondary | ICD-10-CM | POA: Diagnosis present

## 2023-06-29 DIAGNOSIS — I10 Essential (primary) hypertension: Secondary | ICD-10-CM | POA: Diagnosis present

## 2023-06-29 DIAGNOSIS — G928 Other toxic encephalopathy: Secondary | ICD-10-CM | POA: Diagnosis not present

## 2023-06-29 DIAGNOSIS — R339 Retention of urine, unspecified: Secondary | ICD-10-CM | POA: Diagnosis not present

## 2023-06-29 DIAGNOSIS — I4892 Unspecified atrial flutter: Secondary | ICD-10-CM | POA: Diagnosis not present

## 2023-06-29 DIAGNOSIS — J918 Pleural effusion in other conditions classified elsewhere: Secondary | ICD-10-CM | POA: Diagnosis present

## 2023-06-29 DIAGNOSIS — K219 Gastro-esophageal reflux disease without esophagitis: Secondary | ICD-10-CM | POA: Diagnosis present

## 2023-06-29 DIAGNOSIS — R739 Hyperglycemia, unspecified: Secondary | ICD-10-CM | POA: Diagnosis not present

## 2023-06-29 DIAGNOSIS — W449XXA Unspecified foreign body entering into or through a natural orifice, initial encounter: Secondary | ICD-10-CM | POA: Diagnosis present

## 2023-06-29 DIAGNOSIS — Z79899 Other long term (current) drug therapy: Secondary | ICD-10-CM

## 2023-06-29 DIAGNOSIS — Z9841 Cataract extraction status, right eye: Secondary | ICD-10-CM

## 2023-06-29 DIAGNOSIS — J810 Acute pulmonary edema: Secondary | ICD-10-CM

## 2023-06-29 DIAGNOSIS — I4510 Unspecified right bundle-branch block: Secondary | ICD-10-CM | POA: Diagnosis present

## 2023-06-29 DIAGNOSIS — Z6832 Body mass index (BMI) 32.0-32.9, adult: Secondary | ICD-10-CM

## 2023-06-29 DIAGNOSIS — I4811 Longstanding persistent atrial fibrillation: Secondary | ICD-10-CM | POA: Diagnosis present

## 2023-06-29 DIAGNOSIS — T4275XA Adverse effect of unspecified antiepileptic and sedative-hypnotic drugs, initial encounter: Secondary | ICD-10-CM | POA: Diagnosis not present

## 2023-06-29 DIAGNOSIS — I952 Hypotension due to drugs: Secondary | ICD-10-CM | POA: Diagnosis not present

## 2023-06-29 LAB — CBC
HCT: 42 % (ref 36.0–46.0)
HCT: 37.6 % (ref 36.0–46.0)
Hemoglobin: 13.9 g/dL (ref 12.0–15.0)
Hemoglobin: 12.8 g/dL (ref 12.0–15.0)
MCH: 32.1 pg (ref 26.0–34.0)
MCH: 32.9 pg (ref 26.0–34.0)
MCHC: 33.1 g/dL (ref 30.0–36.0)
MCHC: 34 g/dL (ref 30.0–36.0)
MCV: 97 fL (ref 80.0–100.0)
MCV: 96.7 fL (ref 80.0–100.0)
Platelets: 224 10*3/uL (ref 150–400)
Platelets: 186 10*3/uL (ref 150–400)
RBC: 4.33 MIL/uL (ref 3.87–5.11)
RBC: 3.89 MIL/uL (ref 3.87–5.11)
RDW: 12.9 % (ref 11.5–15.5)
RDW: 13 % (ref 11.5–15.5)
WBC: 12.2 10*3/uL — ABNORMAL HIGH (ref 4.0–10.5)
WBC: 10.3 10*3/uL (ref 4.0–10.5)
nRBC: 0 % (ref 0.0–0.2)
nRBC: 0 % (ref 0.0–0.2)

## 2023-06-29 LAB — COMPREHENSIVE METABOLIC PANEL WITH GFR
ALT: 20 U/L (ref 0–44)
AST: 19 U/L (ref 15–41)
Albumin: 3.7 g/dL (ref 3.5–5.0)
Alkaline Phosphatase: 44 U/L (ref 38–126)
Anion gap: 12 (ref 5–15)
BUN: 17 mg/dL (ref 8–23)
CO2: 27 mmol/L (ref 22–32)
Calcium: 9.5 mg/dL (ref 8.9–10.3)
Chloride: 103 mmol/L (ref 98–111)
Creatinine, Ser: 1.03 mg/dL — ABNORMAL HIGH (ref 0.44–1.00)
GFR, Estimated: 54 mL/min — ABNORMAL LOW (ref >60)
Glucose, Bld: 119 mg/dL — ABNORMAL HIGH (ref 70–99)
Potassium: 3.5 mmol/L (ref 3.5–5.1)
Sodium: 142 mmol/L (ref 135–145)
Total Bilirubin: 1.2 mg/dL (ref 0.0–1.2)
Total Protein: 6.2 g/dL — ABNORMAL LOW (ref 6.5–8.1)

## 2023-06-29 LAB — TYPE AND SCREEN
ABO/RH(D): A POS
Antibody Screen: NEGATIVE

## 2023-06-29 LAB — ABO/RH: ABO/RH(D): A POS

## 2023-06-29 MED ORDER — SODIUM CHLORIDE 0.9 % IV BOLUS
1000.0000 mL | Freq: Once | INTRAVENOUS | Status: AC
  Administered 2023-06-29: 1000 mL via INTRAVENOUS

## 2023-06-29 MED ORDER — METOPROLOL TARTRATE 25 MG PO TABS: 50.0000 mg | ORAL_TABLET | Freq: Every day | ORAL | Status: DC

## 2023-06-29 MED ORDER — METOCLOPRAMIDE HCL 5 MG/ML IJ SOLN
5.0000 mg | Freq: Once | INTRAMUSCULAR | Status: AC
  Administered 2023-06-29: 5 mg via INTRAVENOUS
  Filled 2023-06-29: qty 2

## 2023-06-29 MED ORDER — METOPROLOL TARTRATE 25 MG PO TABS: 25.0000 mg | ORAL_TABLET | Freq: Two times a day (BID) | ORAL | Status: DC

## 2023-06-29 MED ORDER — PANTOPRAZOLE SODIUM 40 MG IV SOLR
40.0000 mg | Freq: Two times a day (BID) | INTRAVENOUS | Status: DC
  Administered 2023-06-29 – 2023-07-26 (×53): 40 mg via INTRAVENOUS
  Filled 2023-06-29 (×52): qty 10

## 2023-06-29 MED ORDER — SODIUM CHLORIDE 0.9 % IV BOLUS: 500.0000 mL | Freq: Once | INTRAVENOUS | Status: DC

## 2023-06-29 MED ORDER — ONDANSETRON HCL 4 MG/2ML IJ SOLN
4.0000 mg | Freq: Four times a day (QID) | INTRAMUSCULAR | Status: DC | PRN
  Administered 2023-06-29 – 2023-07-26 (×10): 4 mg via INTRAVENOUS
  Filled 2023-06-29 (×9): qty 2

## 2023-06-29 MED ORDER — METOPROLOL TARTRATE 50 MG PO TABS
50.0000 mg | ORAL_TABLET | Freq: Every day | ORAL | Status: DC
  Administered 2023-06-30: 50 mg via ORAL
  Filled 2023-06-29: qty 1

## 2023-06-29 MED ORDER — DIPHENHYDRAMINE HCL 50 MG/ML IJ SOLN
12.5000 mg | Freq: Once | INTRAMUSCULAR | Status: AC
  Administered 2023-06-29: 12.5 mg via INTRAVENOUS
  Filled 2023-06-29: qty 1

## 2023-06-29 MED ORDER — ACETAMINOPHEN 650 MG RE SUPP: 650.0000 mg | Freq: Four times a day (QID) | RECTAL | Status: DC | PRN

## 2023-06-29 MED ORDER — ONDANSETRON 4 MG PO TBDP
4.0000 mg | ORAL_TABLET | Freq: Once | ORAL | Status: AC
Start: 2023-06-29 — End: 2023-06-29
  Administered 2023-06-29: 4 mg via ORAL
  Filled 2023-06-29: qty 1

## 2023-06-29 MED ORDER — ACETAMINOPHEN 325 MG PO TABS
650.0000 mg | ORAL_TABLET | Freq: Four times a day (QID) | ORAL | Status: DC | PRN
  Administered 2023-07-04 – 2023-07-05 (×2): 650 mg via ORAL
  Filled 2023-06-29 (×3): qty 2

## 2023-06-29 MED ORDER — PANTOPRAZOLE SODIUM 40 MG IV SOLR
40.0000 mg | Freq: Once | INTRAVENOUS | Status: AC
  Administered 2023-06-29: 40 mg via INTRAVENOUS
  Filled 2023-06-29: qty 10

## 2023-06-29 MED ORDER — BUSPIRONE HCL 5 MG PO TABS: 5.0000 mg | ORAL_TABLET | Freq: Three times a day (TID) | ORAL | Status: DC | PRN

## 2023-06-29 MED ORDER — ESCITALOPRAM OXALATE 10 MG PO TABS
20.0000 mg | ORAL_TABLET | Freq: Every day | ORAL | Status: DC
  Administered 2023-06-29 – 2023-07-01 (×3): 20 mg via ORAL
  Filled 2023-06-29 (×4): qty 2

## 2023-06-29 MED ORDER — CYCLOBENZAPRINE HCL 5 MG PO TABS
5.0000 mg | ORAL_TABLET | Freq: Three times a day (TID) | ORAL | Status: DC | PRN
Start: 2023-06-29 — End: 2023-07-01

## 2023-06-29 MED ORDER — PROCHLORPERAZINE EDISYLATE 10 MG/2ML IJ SOLN
5.0000 mg | Freq: Once | INTRAMUSCULAR | Status: AC
Start: 2023-06-29 — End: 2023-06-29
  Administered 2023-06-29: 5 mg via INTRAVENOUS
  Filled 2023-06-29: qty 2

## 2023-06-29 MED ORDER — METOPROLOL TARTRATE 25 MG PO TABS
25.0000 mg | ORAL_TABLET | Freq: Every day | ORAL | Status: DC
  Filled 2023-06-29: qty 1

## 2023-06-29 MED ORDER — METOPROLOL TARTRATE 25 MG PO TABS: 25.0000 mg | ORAL_TABLET | Freq: Every day | ORAL | Status: DC

## 2023-06-29 NOTE — H&P (Signed)
 History and Physical    Patient: Darlene [name] ZOX:[id number] DOB: Dec 25, 1940 DOA: 06/29/2023 DOS: the patient was seen and examined on 06/29/2023 PCP: Jenelle Mis, FNP  Patient coming from: Home  Chief Complaint:  Chief Complaint  Patient presents with   GI Bleeding   HPI: Darlene [name] is a 83 y.o. female with medical history significant of atrial fibrillation on eliquis, central sleep apnea, HTN, HLD, GERD, depression, anxiety, chronic lumbar back pain presenting to the ED with nausea and vomiting.  Patient reports that she began having nausea, vomiting, and watery diarrhea about 1 week ago. This persisted for about 4 days. Her vomiting at this time was nonbilious and nonbloody. About 3 days ago, her diarrhea subsided and she became constipated. She has not passed a BM in the past 3 days. She also notes that her vomit became coffee-ground in color about 3 days ago. She was seen at [facility] yesterday and ultimately advised to follow up with GI outpatient for an endoscopy. However, the outpatient GI center discussed that endoscopic evaluation for suspect GI bleed would need to occur inpatient. Therefore, she presented to [facility] today for further evaluation. She denies any fevers, chills, chest pain, palpitations, SHOB, cough, urinary changes. She denies any abdominal pain.  ED course at [facility]: CBC unremarkable with hemoglobin 13.6.  CMP with mild hypokalemia, otherwise unremarkable.  Lactic acid normal x 2.  Lipase normal.  INR 1.4.  Troponins flat at 7 x 2.  Blood cultures x 2 pending.  Urinalysis negative.  CT angio abdomen and pelvis did not reveal any evidence of active GI hemorrhage, did show high-grade stenosis involving celiac access with collateral flow from the SMA.  Also noted to have large hiatal hernia and diverticulosis without diverticulitis.  ED course today: Vital signs notable for tachycardia, tachypnea.  EKG showing atrial fibrillation with incomplete RBBB. CBC with mild leukocytosis,  hemoglobin stable at 13.9.  CMP with glucose 119, creatinine 1.03 (baseline around 0.8).  Patient given Reglan, Zofran, IV Protonix in the ED.  GI consulted by ED provider.  [facility] hospitalist asked to evaluate patient for admission.  Review of Systems: As mentioned in the history of present illness. All other systems reviewed and are negative. Past Medical History:  Diagnosis Date   Anxiety    Chronic lower back pain    Depression    DJD (degenerative joint disease)    knees, neck   GERD (gastroesophageal reflux disease)    has had dilitation in the past   Hypertension    Past Surgical History:  Procedure Laterality Date   ABDOMINAL HYSTERECTOMY  03/1979   BREAST BIOPSY Left 1986 and 1987   CATARACT EXTRACTION, BILATERAL Bilateral 2014   Dr. Michaeleen Adler, in [place]   EYE SURGERY  2024   [facility]   Social History:  reports that she has never smoked. She has never used smokeless tobacco. She reports current alcohol use of about 3.0 standard drinks of alcohol per week. She reports that she does not use drugs.  Allergies  Allergen Reactions   Cephalosporins Rash   Keflex [Cephalexin] Rash    Family History  Problem Relation Age of Onset   Hypertension Mother    Heart disease Mother    Hypertension Father    Heart disease Father    Tremor Sister    Dementia Brother    Hypertension Daughter    Colon cancer Neg Hx    Esophageal cancer Neg Hx  Liver disease Neg Hx     Prior to Admission medications   Medication Sig Start Date End Date Taking? Authorizing Provider  acetaminophen (TYLENOL) 500 MG tablet Take 500 mg by mouth every 6 (six) hours as needed for mild pain (pain score 1-3).   Yes [provider]  albuterol (VENTOLIN HFA) 108 (90 Base) MCG/ACT inhaler Inhale 2 puffs into the lungs every 6 (six) hours as needed for wheezing or shortness of breath. 05/22/22  Yes Cranford, Tonya, NP  apixaban (ELIQUIS) 5 MG TABS tablet TAKE 1 TABLET BY  MOUTH TWICE  DAILY 06/07/23  Yes Branch, Joyceann No, MD  Ascorbic Acid (VITAMIN C) 500 MG CAPS Take 500 mg by mouth.    Yes [provider]  busPIRone (BUSPAR) 10 MG tablet TAKE 1/2 TO 1 TABLET BY MOUTH 3  TIMES DAILY AS NEEDED FOR  ANXIETY, MOOD OR IRRITABILITY 10/22/22  Yes Wilkinson, Dana E, FNP  CHOLECALCIFEROL PO Take 5,000 Units by mouth daily.   Yes [provider]  Cyanocobalamin 2500 MCG TABS Take 2,500 mcg by mouth once a week.   Yes [provider]  cyclobenzaprine (FLEXERIL) 10 MG tablet Take 1/2 to 1 tablet    2 to 3 x /day if needed for Muscle Spasms Patient taking differently: Take 5-10 mg by mouth 3 (three) times daily as needed for muscle spasms. Take 1/2 to 1 tablet    2 to 3 x /day if needed for Muscle Spasms 04/25/19  Yes Vangie Genet, MD  diltiazem (CARDIZEM CD) 300 MG 24 hr capsule TAKE 1 CAPSULE BY MOUTH DAILY 10/22/22  Yes Branch, Joyceann No, MD  docusate sodium (COLACE) 100 MG capsule Take 100 mg by mouth daily as needed for mild constipation.   Yes [provider]  escitalopram (LEXAPRO) 20 MG tablet TAKE 1 TABLET BY MOUTH DAILY FOR MOOD, ANXIETY &amp; IRRITABILITY 10/10/22  Yes Wilkinson, Dana E, FNP  famotidine (PEPCID) 40 MG tablet Take 1 tablet (40 mg total) by mouth at bedtime. 06/28/23  Yes Dorrell, Porfirio Bristol, MD  ferrous sulfate 325 (65 FE) MG tablet Take 1 tablet (325 mg total) by mouth every other day. 01/15/20  Yes [place], NP  Magnesium 250 MG TABS Take 1 tablet by mouth daily.   Yes [provider]  metoprolol tartrate (LOPRESSOR) 25 MG tablet TAKE 2 TABLETS BY MOUTH IN THE  MORNING AND 1 TABLET BY MOUTH IN THE EVENING Patient taking differently: Take 25-50 mg by mouth 2 (two) times daily. TAKE 2 TABLETS BY MOUTH IN THE  MORNING AND 1 TABLET BY MOUTH IN THE EVENING 05/05/23  Yes Branch, Joyceann No, MD  nitroGLYCERIN (NITROSTAT) 0.4 MG SL tablet Place 1 tablet (0.4 mg total) under the tongue every 5 (five) minutes as  needed for chest pain. 02/05/20  Yes Strader, [place] M, PA-C  pantoprazole (PROTONIX) 40 MG tablet Take 1 tablet (40 mg total) by mouth 2 (two) times daily. Take  1 tablet  Daily  to prevent  Indigestion & Acid Reflux 06/28/23  Yes Dorrell, Porfirio Bristol, MD  Probiotic Product (PROBIOTIC DAILY PO) Take 1 tablet by mouth daily.   Yes [provider]  sucralfate (CARAFATE) 1 g tablet Take 2 tablets (2 g total) by mouth 4 (four) times daily -  with meals and at bedtime. 06/28/23  Yes Dorrell, Porfirio Bristol, MD  zinc gluconate 50 MG tablet Take 50 mg by mouth daily.   Yes [provider]    Physical Exam: Vitals:   06/29/23  1151 06/29/23 1202  BP: 121/61   Pulse: 82   Resp: 18   Temp: 98.6 F (37 C)   SpO2: 94%   Weight:  103.7 kg  Height:  5\' 10"  (1.778 m)   Physical Exam Constitutional:      Appearance: She is obese. She is not ill-appearing.  HENT:     Head: Normocephalic and atraumatic.     Mouth/Throat:     Mouth: Mucous membranes are moist.     Pharynx: Oropharynx is clear. No oropharyngeal exudate.  Eyes:     General: No scleral icterus.    Extraocular Movements: Extraocular movements intact.     Conjunctiva/sclera: Conjunctivae normal.     Pupils: Pupils are equal, round, and reactive to light.  Cardiovascular:     Rate and Rhythm: Tachycardia present. Rhythm irregular.     Heart sounds: Normal heart sounds. No murmur heard.    No friction rub. No gallop.  Pulmonary:     Effort: Pulmonary effort is normal. No respiratory distress.     Breath sounds: Normal breath sounds. No wheezing, rhonchi or rales.  Abdominal:     General: Bowel sounds are normal. There is no distension.     Palpations: Abdomen is soft.     Tenderness: There is no guarding or rebound.     Comments: TTP in RUQ and RMQ. No guarding or rebound tenderness noted.   Genitourinary:    Comments: Rectal exam deferred. Musculoskeletal:        General: No swelling. Normal range of motion.     Cervical  back: Normal range of motion.  Skin:    General: Skin is warm and dry.  Neurological:     General: No focal deficit present.     Mental Status: She is alert and oriented to person, place, and time.  Psychiatric:        Mood and Affect: Mood normal.        Behavior: Behavior normal.     Data Reviewed:  There are no new results to review at this time.    Latest Ref Rng & Units 06/29/2023   12:09 PM 06/28/2023    6:06 PM 02/16/2023   10:45 AM  CBC  WBC 4.0 - 10.5 K/uL 12.2  8.8  5.8   Hemoglobin 12.0 - 15.0 g/dL 81.1  91.4  78.2   Hematocrit 36.0 - 46.0 % 42.0  40.0  39.3   Platelets 150 - 400 K/uL 224  225  188       Latest Ref Rng & Units 06/29/2023   12:09 PM 06/28/2023    6:06 PM 02/16/2023   10:45 AM  CMP  Glucose 70 - 99 mg/dL 956  213  89   BUN 8 - 23 mg/dL 17  17  17    Creatinine 0.44 - 1.00 mg/dL 0.86  5.78  4.69   Sodium 135 - 145 mmol/L 142  140  142   Potassium 3.5 - 5.1 mmol/L 3.5  3.4  4.4   Chloride 98 - 111 mmol/L 103  101  105   CO2 22 - 32 mmol/L 27  28  30    Calcium 8.9 - 10.3 mg/dL 9.5  9.3  9.3   Total Protein 6.5 - 8.1 g/dL 6.2  6.1  6.0   Total Bilirubin 0.0 - 1.2 mg/dL 1.2  1.0  1.1   Alkaline Phos 38 - 126 U/L 44  50    AST 15 - 41 U/L 19  22  15   ALT 0 - 44 U/L 20  20  12      Assessment and Plan: No notes have been filed under this hospital service. Service: Hospitalist  Suspected acute upper GI bleed Patient presenting with intractable nausea and coffee-ground emesis with right sided abdominal pain on exam. CTA abd/pel yesterday negative for active GI bleed. She did also experience watery diarrhea earlier this week but this has since resolved. GI consulted, recommended CLD through today and to make NPO at midnight for possible endoscopic evaluation tomorrow. She is currently hemodynamically stable. Her hemoglobin level is stable compared to labs throughout the past year. -GI following, appreciate assistance -CLD, NPO at midnight -S/p 1L IVF,  providing additional 1L bolus -f/u H&H tonight -IV protonix q12h -zofran q6h prn for nausea -plan/timing for EGD per GI -telemetry -SCDs for dvt ppx (holding home eliquis) -place in observation  Atrial fibrillation Patient currently in atrial fibrillation, with rates in the 100s-110s on my exam.  She is on metoprolol tartrate, diltiazem, and Eliquis at home.  Holding home Eliquis given suspected GI bleed.  Will resume her home metoprolol tartrate.  Holding home diltiazem to avoid precipitating hypotension. - Resume home metoprolol tartrate 50 mg every morning and 25 mg every night (hold if SBP <100 and/or HR <60) - Holding home diltiazem - Holding home Eliquis - Telemetry  Hypertension Patient on metoprolol tartrate 50 mg every morning and 25 mg every night along with diltiazem 300 mg daily at home.  Blood pressures have fluctuated while in the ED.  Her lowest BP was 99/69 but blood pressures have since improved to the 120s-140s/60s-80s.  Will resume her home metoprolol tartrate but will hold home diltiazem for now to avoid precipitating hypotension. - Resume home metoprolol tartrate (hold if SBP <100 and/or HR <60) - Holding home diltiazem  Central sleep apnea - Per chart review, correlated with atrial fibrillation - CPAP nightly  Moderate right lower Spigelian hernia Large hiatal hernia -CT imaging from 05/31/23 showing spigelian hernia containing nondilated loop of ascending colon -scheduled for hernia repair (hiatal hernia and spigelian hernia) with Dr. Fredricka Bonine on 08/03/23  Hyperlipidemia - Patient not on statin at home  GERD - IV PPI twice daily as noted above  Depression and anxiety - Resume home Lexapro 20 mg daily - resume home buspar 5mg  TID prn  Chronic lumbar back pain - Resume home Flexeril at reduced dose of 5 mg 3 times daily as needed   Advance Care Planning:   Code Status: Full Code   Consults: GI  Family Communication: updated family at bedside  Severity  of Illness: The appropriate patient status for this patient is OBSERVATION. Observation status is judged to be reasonable and necessary in order to provide the required intensity of service to ensure the patient's safety. The patient's presenting symptoms, physical exam findings, and initial radiographic and laboratory data in the context of their medical condition is felt to place them at decreased risk for further clinical deterioration. Furthermore, it is anticipated that the patient will be medically stable for discharge from the hospital within 2 midnights of admission.   Portions of this note were generated with Scientist, clinical (histocompatibility and immunogenetics). Dictation errors may occur despite best attempts at proofreading.  Author: Briscoe Burns, MD 06/29/2023 3:09 PM  For on call review [email or web address].

## 2023-06-29 NOTE — Consult Note (Addendum)
 Referring Provider: Dr. Merrilyn Puma Primary Care Physician:  Park Meo, FNP Primary Gastroenterologist:  Dr. Doy Hutching  Reason for Consultation:  N/V, hematemesis, abdominal pain   HPI: Darlene [name] is a 83 y.o. female with past medical history of A-fib with RVR on Eliquis, pulmonary hypertension, central sleep apnea on CPAP, morbid obesity, GERD, dysphagia with esophageal strictures requiring dilation, and hiatal hernia, who presents to the ED with complaint of intractable nausea and vomiting which initially started 2 weeks ago. Reports history of difficulty eating, with episodes of early satiety about once a month which would typically resolve on its own. Over the past week has had abdominal pain, nausea, vomiting, and diarrhea which has worsened.   She had severe abdominal pain yesterday, described feeling a tight band across the center of her abdomen, buckled over in pain. Her daughter drove her to [facility]  ED yesterday for further evaluation. In the ED, she episode of dark which was occult positive. She reported having coffee ground emesis 2 days prior, otherwise denied history of upper GI bleeding.  In the ED showing a WBC count of 8.  Hemoglobin 13.6.  17.  Creatinine 0.6.  Normal LFTs.  Lipase 24.  INR 1.4.  Chest CT showed a large hiatal hernia and passive atelectasis in the right lower chest.  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  She was previously scheduled to for an EGD outpatient 4/16 with Dr. Doy Hutching, was advised to keep her EGD as scheduled and was not admitted.   She contacted our office earlier this morning to confirm her EGD procedure, however, she continued to have N/V and abdominal pain therefore she was sent to the [facility] ED for further evaluation.  Labs in the ED showed a WBC count of 12.2.  Hemoglobin 13.9.  Platelets 224.   BUN 17.  Creatinine 1.03.  Normal LFTs.  FOBT ordered, not yet completed.  She has intermittent nausea and dry heaves since arriving to the ED without active emesis/hematemesis.  She has central generalized abdominal pain which is well-controlled at this time.  She has chronic constipation reported taking laxatives at home laxatives for the past few days and she has been for 15 days past 2 days.  She endorses passing nonbloody loose stools for the past few days, last BM was 1 or 2 days ago.  She intermittently sees bright red blood on the toilet tissue as well as in the toilet bowl.  She is on Eliquis secondary to history of A-fib, last dose was taken on Sunday 4/10.  Cardiac monitor in the ED showed she is in afib/flutter with a ventricular rate 120's. BP 117/85, RR 19.  No chest pain or shortness of breath.  She was initially seen in our outpatient GI clinic 05/19/2023 by Dr. Doy Hutching for GERD, dysphagia, constipation. At that time, she was having symptoms similar to what she had experienced in the past when she required esophageal dilation. Symptoms had been progressive over 6-8 months, difficulty with solid food and pills, no difficulty with liquids.  She takes Pantoprazole 40 mg daily for the past year and prior to that took Prevacid.  She was scheduled for EGD with dilation on 06/30/2023. CT A/P was ordered due to finding of RLQ mass and tenderness on exam which was completed 05/31/2023 which showed a moderate right lower Spigelian hernia containing a nondilated loop of ascending  colon, without fluid/inflammatory changes and a large hiatal hernia/inverted intrathoracic stomach, incompletely visualized.  She was evaluated by general surgery as an outpatient. She is scheduled for hernia repair (hiatal hernia and spigelian hernia) with Dr. Aldon Hung 08/03/2023.  She underwent an EGD and colonoscopy 06/07/2017.  The EGD identified a benign-appearing esophageal stenosis which was dilated, medium hiatal  hernia.  The colonoscopy identified one 12 mm sessile serrated polyp removed from the transverse colon and one 7 mm polyp removed from the sigmoid colon.  PRIOR IMAGE STUDIES:  CT A/P 05/31/2023 IMPRESSION: Moderate right lower Spigelian hernia containing a nondilated loop of ascending colon, without fluid/inflammatory changes.   Large hiatal hernia/inverted intrathoracic stomach, incompletely visualized.   4.1 cm exophytic fat density lesion along the right lower kidney, favoring a renal angiomyolipoma over a perirenal lipoma, benign.  PAST GI PROCEDURES:  EGD 06/07/2017 with Dr. Sandrea Cruel for dysphagia, GERD - Benign- appearing esophageal stenosis. Dilated. - Medium- sized hiatal hernia. - Normal duodenal bulb and second portion of the duodenum. - No specimens collected.  Colonoscopy 06/07/2017 with Dr. Sandrea Cruel for iron deficiency anemia - One 12 mm polyp in the transverse colon, removed with a hot snare. Resected and retrieved. - One 7 mm polyp in the sigmoid colon, removed with a cold snare. Resected and retrieved. - Internal hemorrhoids. - The examination was otherwise normal on direct and retroflexion views.  1. Surgical [P], transverse, polyp - SESSILE SERRATED POLYP (4 OF 4 FRAGMENTS) - NO HIGH GRADE DYSPLASIA OR MALIGNANCY IDENTIFIED 2. Surgical [P], sigmoid, polyp - TUBULAR ADENOMA (1 OF 1 FRAGMENTS) - NO HIGH GRADE DYSPLASIA OR MALIGNANCY IDENTIFIED     Past Medical History:  Diagnosis Date   Anxiety    Chronic lower back pain    Depression    DJD (degenerative joint disease)    knees, neck   GERD (gastroesophageal reflux disease)    has had dilitation in the past   Hypertension     Past Surgical History:  Procedure Laterality Date   ABDOMINAL HYSTERECTOMY  03/1979   BREAST BIOPSY Left 1986 and 1987   CATARACT EXTRACTION, BILATERAL Bilateral 2014   Dr. Michaeleen Adler, in [place]   EYE SURGERY  2024   [facility]    Prior to Admission  medications   Medication Sig Start Date End Date Taking? Authorizing Provider  acetaminophen (TYLENOL) 500 MG tablet Take 500 mg by mouth every 6 (six) hours as needed for mild pain (pain score 1-3).    [provider]  albuterol (VENTOLIN HFA) 108 (90 Base) MCG/ACT inhaler Inhale 2 puffs into the lungs every 6 (six) hours as needed for wheezing or shortness of breath. 05/22/22   Cranford, Tonya, NP  apixaban (ELIQUIS) 5 MG TABS tablet TAKE 1 TABLET BY MOUTH TWICE  DAILY 06/07/23   Laurann Pollock, MD  Ascorbic Acid (VITAMIN C) 500 MG CAPS Take 500 mg by mouth.     [provider]  busPIRone (BUSPAR) 10 MG tablet TAKE 1/2 TO 1 TABLET BY MOUTH 3  TIMES DAILY AS NEEDED FOR  ANXIETY, MOOD OR IRRITABILITY 10/22/22   Wilkinson, Dana E, FNP  CHOLECALCIFEROL PO Take 5,000 Units by mouth daily.    [provider]  Cyanocobalamin 2500 MCG TABS Take 2,500 mcg by mouth once a week.    [provider]  cyclobenzaprine (FLEXERIL) 10 MG tablet Take 1/2 to 1 tablet    2 to 3 x /day if needed for Muscle  Spasms Patient taking differently: Take 5-10 mg by mouth 3 (three) times daily as needed for muscle spasms. Take 1/2 to 1 tablet    2 to 3 x /day if needed for Muscle Spasms 04/25/19   Lucky Cowboy, MD  diltiazem (CARDIZEM CD) 300 MG 24 hr capsule TAKE 1 CAPSULE BY MOUTH DAILY 10/22/22   [name], MD  docusate sodium (COLACE) 100 MG capsule Take 100 mg by mouth daily as needed for mild constipation.    [provider]  escitalopram (LEXAPRO) 20 MG tablet TAKE 1 TABLET BY MOUTH DAILY FOR MOOD, ANXIETY &amp; IRRITABILITY 10/10/22   Raynelle Dick, FNP  famotidine (PEPCID) 40 MG tablet Take 1 tablet (40 mg total) by mouth at bedtime. 06/28/23   Alan Mulder, MD  ferrous sulfate 325 (65 FE) MG tablet Take 1 tablet (325 mg total) by mouth every other day. 01/15/20   [name], NP  Magnesium 250 MG TABS Take 1 tablet by mouth daily.    [provider]   metoprolol tartrate (LOPRESSOR) 25 MG tablet TAKE 2 TABLETS BY MOUTH IN THE  MORNING AND 1 TABLET BY MOUTH IN THE EVENING Patient taking differently: Take 25-50 mg by mouth 2 (two) times daily. TAKE 2 TABLETS BY MOUTH IN THE  MORNING AND 1 TABLET BY MOUTH IN THE EVENING 05/05/23   [name], MD  nitroGLYCERIN (NITROSTAT) 0.4 MG SL tablet Place 1 tablet (0.4 mg total) under the tongue every 5 (five) minutes as needed for chest pain. 02/05/20   Strader, Lennart Pall, PA-C  pantoprazole (PROTONIX) 40 MG tablet Take 1 tablet (40 mg total) by mouth 2 (two) times daily. Take  1 tablet  Daily  to prevent  Indigestion & Acid Reflux 06/28/23   Alan Mulder, MD  Probiotic Product (PROBIOTIC DAILY PO) Take 1 tablet by mouth daily.    [provider]  sucralfate (CARAFATE) 1 g tablet Take 2 tablets (2 g total) by mouth 4 (four) times daily -  with meals and at bedtime. 06/28/23   Alan Mulder, MD  zinc gluconate 50 MG tablet Take 50 mg by mouth daily.    [provider]    Current Facility-Administered Medications  Medication Dose Route Frequency Provider Last Rate Last Admin   ondansetron (ZOFRAN) injection 4 mg  4 mg Intravenous Q6H PRN Briscoe Burns, MD       pantoprazole (PROTONIX) injection 40 mg  40 mg Intravenous Q12H Briscoe Burns, MD       Current Outpatient Medications  Medication Sig Dispense Refill   acetaminophen (TYLENOL) 500 MG tablet Take 500 mg by mouth every 6 (six) hours as needed for mild pain (pain score 1-3).     albuterol (VENTOLIN HFA) 108 (90 Base) MCG/ACT inhaler Inhale 2 puffs into the lungs every 6 (six) hours as needed for wheezing or shortness of breath. 8 g 2   apixaban (ELIQUIS) 5 MG TABS tablet TAKE 1 TABLET BY MOUTH TWICE  DAILY 200 tablet 1   Ascorbic Acid (VITAMIN C) 500 MG CAPS Take 500 mg by mouth.      busPIRone (BUSPAR) 10 MG tablet TAKE 1/2 TO 1 TABLET BY MOUTH 3  TIMES DAILY AS NEEDED FOR  ANXIETY, MOOD OR IRRITABILITY 90 tablet 0    CHOLECALCIFEROL PO Take 5,000 Units by mouth daily.     Cyanocobalamin 2500 MCG TABS Take 2,500 mcg by mouth once a week.     cyclobenzaprine (FLEXERIL) 10 MG tablet Take  1/2 to 1 tablet    2 to 3 x /day if needed for Muscle Spasms (Patient taking differently: Take 5-10 mg by mouth 3 (three) times daily as needed for muscle spasms. Take 1/2 to 1 tablet    2 to 3 x /day if needed for Muscle Spasms) 90 tablet 0   diltiazem (CARDIZEM CD) 300 MG 24 hr capsule TAKE 1 CAPSULE BY MOUTH DAILY 100 capsule 2   docusate sodium (COLACE) 100 MG capsule Take 100 mg by mouth daily as needed for mild constipation.     escitalopram (LEXAPRO) 20 MG tablet TAKE 1 TABLET BY MOUTH DAILY FOR MOOD, ANXIETY &amp; IRRITABILITY 90 tablet 3   famotidine (PEPCID) 40 MG tablet Take 1 tablet (40 mg total) by mouth at bedtime. 30 tablet 1   ferrous sulfate 325 (65 FE) MG tablet Take 1 tablet (325 mg total) by mouth every other day.     Magnesium 250 MG TABS Take 1 tablet by mouth daily.     metoprolol tartrate (LOPRESSOR) 25 MG tablet TAKE 2 TABLETS BY MOUTH IN THE  MORNING AND 1 TABLET BY MOUTH IN THE EVENING (Patient taking differently: Take 25-50 mg by mouth 2 (two) times daily. TAKE 2 TABLETS BY MOUTH IN THE  MORNING AND 1 TABLET BY MOUTH IN THE EVENING) 270 tablet 3   nitroGLYCERIN (NITROSTAT) 0.4 MG SL tablet Place 1 tablet (0.4 mg total) under the tongue every 5 (five) minutes as needed for chest pain. 25 tablet 3   pantoprazole (PROTONIX) 40 MG tablet Take 1 tablet (40 mg total) by mouth 2 (two) times daily. Take  1 tablet  Daily  to prevent  Indigestion & Acid Reflux 90 tablet 3   Probiotic Product (PROBIOTIC DAILY PO) Take 1 tablet by mouth daily.     sucralfate (CARAFATE) 1 g tablet Take 2 tablets (2 g total) by mouth 4 (four) times daily -  with meals and at bedtime. 240 tablet 0   zinc gluconate 50 MG tablet Take 50 mg by mouth daily.      Allergies as of 06/29/2023 - Review Complete 06/29/2023  Allergen Reaction  Noted   Cephalosporins Rash 04/24/2019   Keflex [cephalexin] Rash 06/23/2023    Family History  Problem Relation Age of Onset   Hypertension Mother    Heart disease Mother    Hypertension Father    Heart disease Father    Tremor Sister    Dementia Brother    Hypertension Daughter    Colon cancer Neg Hx    Esophageal cancer Neg Hx    Liver disease Neg Hx     Social History   Socioeconomic History   Marital status: Widowed    Spouse name: Not on file   Number of children: 2   Years of education: Not on file   Highest education level: 12th grade  Occupational History   Occupation: retired    Comment: worked in Designer, fashion/clothing  Tobacco Use   Smoking status: Never   Smokeless tobacco: Never  Vaping Use   Vaping status: Never Used  Substance and Sexual Activity   Alcohol use: Yes    Alcohol/week: 3.0 standard drinks of alcohol    Types: 3 Standard drinks or equivalent per week    Comment: 3 nights out of the week   Drug use: No   Sexual activity: Not Currently  Other Topics Concern   Not on file  Social History Narrative   Not on file   Social  Drivers of Health   Financial Resource Strain: Low Risk  (02/03/2021)   Overall Financial Resource Strain (CARDIA)    Difficulty of Paying Living Expenses: Not very hard  Food Insecurity: No Food Insecurity (07/17/2021)   Hunger Vital Sign    Worried About Running Out of Food in the Last Year: Never true    Ran Out of Food in the Last Year: Never true  Transportation Needs: No Transportation Needs (07/17/2021)   PRAPARE - Administrator, Civil Service (Medical): No    Lack of Transportation (Non-Medical): No  Physical Activity: Inactive (04/28/2019)   Exercise Vital Sign    Days of Exercise per Week: 0 days    Minutes of Exercise per Session: 0 min  Stress: No Stress Concern Present (04/28/2019)   [employer] of Occupational Health - Occupational Stress Questionnaire    Feeling of Stress : Only a little  Social  Connections: Moderately Integrated (04/28/2019)   Social Connection and Isolation Panel [NHANES]    Frequency of Communication with Friends and Family: More than three times a week    Frequency of Social Gatherings with Friends and Family: More than three times a week    Attends Religious Services: 1 to 4 times per year    Active Member of [employer] or Organizations: Yes    Attends Banker Meetings: More than 4 times per year    Marital Status: Widowed  Intimate Partner Violence: Not At Risk (04/28/2019)   Humiliation, Afraid, Rape, and Kick questionnaire    Fear of Current or Ex-Partner: No    Emotionally Abused: No    Physically Abused: No    Sexually Abused: No    Review of Systems: Gen: Denies fever, sweats or chills. No weight loss.  CV: Denies chest pain, palpitations or edema. Resp: Denies cough, shortness of breath of hemoptysis.  GI: See HPI.   GU : Denies urinary burning, blood in urine, increased urinary frequency or incontinence. MS: Denies joint pain, muscles aches or weakness. Derm: Denies rash, itchiness, skin lesions or unhealing ulcers. Psych: Denies depression, anxiety, memory loss or confusion. Heme: Denies easy bruising, bleeding. Neuro:  Denies headaches, dizziness or paresthesias. Endo:  Denies any problems with DM, thyroid or adrenal function.  Physical Exam: Vital signs in last 24 hours: Temp:  [96.7 F (35.9 C)-98.6 F (37 C)] 98.6 F (37 C) (04/15 1151) Pulse Rate:  [71-88] 82 (04/15 1151) Resp:  [18-27] 18 (04/15 1151) BP: (96-141)/(55-78) 121/61 (04/15 1151) SpO2:  [90 %-94 %] 94 % (04/15 1151) Weight:  [103.7 kg] 103.7 kg (04/15 1202)   General: Alert 83 year old female in no acute distress. Head:  Normocephalic and atraumatic. Eyes:  No scleral icterus. Conjunctiva pink. Ears:  Normal auditory acuity. Nose:  No deformity, discharge or lesions. Mouth:  Dentition intact. No ulcers or lesions.  Neck:  Supple. No lymphadenopathy or  thyromegaly.  Lungs: Breath sounds clear throughout. No wheezes, rhonchi or crackles.  Heart: Regular rhythm, no murmur. Abdomen: Obese abdomen, soft, firmness to the right mid abdomen.  Mild tenderness throughout the right abdomen without rebound or guarding.  Positive bowel sounds to all 4 quadrants. Rectal: Deferred. Musculoskeletal:  Symmetrical without gross deformities.  Pulses:  Normal pulses noted. Extremities:  No edema. Neurologic:  Alert and  oriented x 4. No focal deficits.  Skin:  Intact without significant lesions or rashes. Psych:  Alert and cooperative. Normal mood and affect.  Intake/Output from previous day: No intake/output data recorded. Intake/Output  this shift: No intake/output data recorded.  Lab Results: Recent Labs    06/28/23 1806 06/29/23 1209  WBC 8.8 12.2*  HGB 13.6 13.9  HCT 40.0 42.0  PLT 225 224   BMET Recent Labs    06/28/23 1806 06/29/23 1209  NA 140 142  K 3.4* 3.5  CL 101 103  CO2 28 27  GLUCOSE 126* 119*  BUN 17 17  CREATININE 0.93 1.03*  CALCIUM 9.3 9.5   LFT Recent Labs    06/29/23 1209  PROT 6.2*  ALBUMIN 3.7  AST 19  ALT 20  ALKPHOS 44  BILITOT 1.2   PT/INR Recent Labs    06/28/23 1806  LABPROT 16.9*  INR 1.4*   Hepatitis Panel No results for input(s): "HEPBSAG", "HCVAB", "HEPAIGM", "HEPBIGM" in the last 72 hours.    Studies/Results: CT ANGIO GI BLEED Result Date: 06/28/2023 CLINICAL DATA:  Upper abdominal pain, evaluate for GI bleeding EXAM: CTA ABDOMEN AND PELVIS WITHOUT AND WITH CONTRAST TECHNIQUE: Multidetector CT imaging of the abdomen and pelvis was performed using the standard protocol during bolus administration of intravenous contrast. Multiplanar reconstructed images and MIPs were obtained and reviewed to evaluate the vascular anatomy. RADIATION DOSE REDUCTION: This exam was performed according to the departmental dose-optimization program which includes automated exposure control, adjustment of the  mA and/or kV according to patient size and/or use of iterative reconstruction technique. CONTRAST:  OMNIPAQUE IOHEXOL 350 MG/ML SOLN COMPARISON:  05/31/2023 FINDINGS: VASCULAR Aorta: Atherosclerotic calcifications of the aorta are noted without aneurysmal dilatation or dissection. 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 Collateral flow from the SMA to the celiac axis branches is noted. SMA: Within normal limits. Renals: Both renal arteries are patent without evidence of aneurysm, dissection, vasculitis, fibromuscular dysplasia or significant stenosis. IMA: Patent without evidence of aneurysm, dissection, vasculitis or significant stenosis. Inflow: Iliacs are within normal limits. Veins: No specific venous abnormality is noted. Review of the MIP images confirms the above findings. NON-VASCULAR Lower chest: Lung bases demonstrate compressive atelectasis in the right lower lobe secondary to large hiatal hernia. Hepatobiliary: Liver shows multiple cystic lesions which are stable in appearance from the prior exam. Gallbladder is well distended with stones within. Common bile duct has increased in size when compared with the prior exam. This measures up to 14 mm. There is a suggestion of a tiny distal stone in the coronal image number 39 of series 7. Correlation with laboratory values is recommended. Pancreas: Unremarkable. No pancreatic ductal dilatation or surrounding inflammatory changes. Spleen: Normal in size without focal abnormality. Adrenals/Urinary Tract: Adrenal glands are within normal limits. Kidneys demonstrate a normal enhancement pattern bilaterally. An exophytic fatty lesion from the inferior aspect of the right kidney is again seen and stable measuring up to 4.1 cm. This is consistent with a angiomyolipoma. The bladder is decompressed. Stomach/Bowel: Large hiatal hernia is identified within the right chest. Scattered diverticular change of the colon is seen. No obstructive  or inflammatory changes of the colon are noted. A loop of ascending colon extends through a anterior abdominal wall spigelian hernia lateral to the right rectus muscle. This is stable in appearance from the prior exam. No obstructive changes are seen. Small bowel is within normal limits. No pooling of contrast is identified to suggest GI hemorrhage. The appendix is not well visualized. No inflammatory changes to suggest appendicitis are noted. Lymphatic: No lymphadenopathy is noted. Reproductive: Status post hysterectomy. No adnexal masses. Other: No abdominal wall hernia or abnormality. No  abdominopelvic ascites. Musculoskeletal: No acute or significant osseous findings. IMPRESSION: VASCULAR No evidence of active GI hemorrhage. 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 Large hiatal hernia. Diverticulosis without diverticulitis. Electronically Signed   By: Violeta Grey M.D.   On: 06/28/2023 20:41   DG Chest Port 1 View Result Date: 06/28/2023 CLINICAL DATA:  Upper abdominal pain, sepsis EXAM: PORTABLE CHEST 1 VIEW COMPARISON:  08/31/2022 FINDINGS: Large density in the right lower chest favoring hiatal hernia and adjacent passive atelectasis. Reverse lordotic projection. Upper normal heart size. The left lung appears clear. Atherosclerotic calcification of the aortic arch. IMPRESSION: 1. Large hiatal hernia with adjacent passive atelectasis in the right lower chest. 2. Upper normal heart size. 3. Aortic Atherosclerosis (ICD10-I70.0). Electronically Signed   By: Freida Jes M.D.   On: 06/28/2023 20:17    IMPRESSION/PLAN:  83 year old female with a past medical history of GERD, benign esophageal stenosis and a large hiatal hernia presents with N/V, coffee ground black emesis and central abdominal pain.  Previously scheduled for an outpatient EGD with Dr. Yvone Herd 06/30/2023 due to having GERD, dysphagia  and early satiety symptoms.  CTA 4/14 was negative for active GI bleed. -Clear liquid diet -NPO after midnight  -IV fluids and pain management per the hospitalist -IV PPI twice daily -Ondansetron 4 mg p.o. or IV every 6 hours as needed -EGD benefits and risks discussed including risk with sedation, risk of bleeding, perforation and infection.  Timing to be determined, await further recommendations per Dr. Rosaline Coma.  RLQ pain. Prior outpatient CTAP 05/31/2023 showed a moderate right lower Spigelian hernia containing a nondilated loop of ascending colon, without fluid/inflammatory changes.  -She is scheduled for hernia repair (hiatal hernia and spigelian hernia) with Dr. Aldon Hung 08/03/2023.  Atrial fibrillation on Eliquis.  Last dose of Eliquis was on Sunday 06/27/2023. -Continue to hold Eliquis  Leukocytosis, WBC 12.2. Afebrile. -CBC in am  History of colon polyps.  Colonoscopy 05/2017 identified 2 sessile serrated/tubular adenomatous polyps removed from colon.  Chronic constipation, intermittently takes multiple laxatives.  Recent loose stools without taking any laxatives.  No recent antibiotic use. - Check GI pathogen panel if patient demonstrates active diarrhea   Tory Freiberg  06/29/2023, 4:08 PM

## 2023-06-29 NOTE — ED Provider Notes (Signed)
 [facility] EMERGENC[facility] DEPARTMENT AT [facility] Provider Note   CSN: [id number] Arrival date & time: 06/29/23  1046     History  Chief Complaint  Patient presents with   GI Bleeding    Darlene [name] is a 83 y.o. female.  83 yo F with a chief complaints of intractable nausea and vomiting.  Patient tells me that she has struggled with difficulty with eating for many years.  She said maybe once a month she would have an episode where she felt like she was very full and was unable to eat anymore.  Tended to get better on its own.  She unfortunately over the past week developed nausea vomiting and diarrhea and has had intractable nausea and vomiting for the past few days.  She went to the emergency department in [place], and had an episode of dark emesis that was occult positive.  There are plans to admit her there for endoscopy and instead she had planned to have the endoscopy done as an outpatient.  When they had called to confirm today sounds like they suggested she had to come to the hospital if there is some concern for acute bleeding.        Home Medications Prior to Admission medications   Medication Sig Start Date End Date Taking? Authorizing Provider  acetaminophen (TYLENOL) 500 MG tablet Take 500 mg by mouth every 6 (six) hours as needed for mild pain (pain score 1-3).    [provider]  albuterol (VENTOLIN HFA) 108 (90 Base) MCG/ACT inhaler Inhale 2 puffs into the lungs every 6 (six) hours as needed for wheezing or shortness of breath. 05/22/22   Cranford, Tonya, NP  apixaban (ELIQUIS) 5 MG TABS tablet TAKE 1 TABLET BY MOUTH TWICE  DAILY 06/07/23   Laurann Pollock, MD  Ascorbic Acid (VITAMIN C) 500 MG CAPS Take 500 mg by mouth.     [provider]  busPIRone (BUSPAR) 10 MG tablet TAKE 1/2 TO 1 TABLET BY MOUTH 3  TIMES DAILY AS NEEDED FOR  ANXIETY, MOOD OR IRRITABILITY 10/22/22   Wilkinson, Dana E, FNP  CHOLECALCIFEROL PO Take 5,000 Units by mouth  daily.    [provider]  Cyanocobalamin 2500 MCG TABS Take 2,500 mcg by mouth once a week.    [provider]  cyclobenzaprine (FLEXERIL) 10 MG tablet Take 1/2 to 1 tablet    2 to 3 x /day if needed for Muscle Spasms Patient taking differently: Take 5-10 mg by mouth 3 (three) times daily as needed for muscle spasms. Take 1/2 to 1 tablet    2 to 3 x /day if needed for Muscle Spasms 04/25/19   Vangie Genet, MD  diltiazem (CARDIZEM CD) 300 MG 24 hr capsule TAKE 1 CAPSULE BY MOUTH DAILY 10/22/22   Laurann Pollock, MD  docusate sodium (COLACE) 100 MG capsule Take 100 mg by mouth daily as needed for mild constipation.    [provider]  escitalopram (LEXAPRO) 20 MG tablet TAKE 1 TABLET BY MOUTH DAILY FOR MOOD, ANXIETY &amp; IRRITABILITY 10/10/22   Wilkinson, Dana E, FNP  famotidine (PEPCID) 40 MG tablet Take 1 tablet (40 mg total) by mouth at bedtime. 06/28/23   Dorrell, Robert, MD  ferrous sulfate 325 (65 FE) MG tablet Take 1 tablet (325 mg total) by mouth every other day. 01/15/20   Ohatchee Bureau, NP  Magnesium 250 MG TABS Take 1 tablet by mouth daily.    [provider]  metoprolol  tartrate (LOPRESSOR) 25 MG tablet TAKE 2 TABLETS BY MOUTH IN THE  MORNING AND 1 TABLET BY MOUTH IN THE EVENING Patient taking differently: Take 25-50 mg by mouth 2 (two) times daily. TAKE 2 TABLETS BY MOUTH IN THE  MORNING AND 1 TABLET BY MOUTH IN THE EVENING 05/05/23   [name], MD  nitroGLYCERIN (NITROSTAT) 0.4 MG SL tablet Place 1 tablet (0.4 mg total) under the tongue every 5 (five) minutes as needed for chest pain. 02/05/20   Strader, Lennart Pall, PA-C  pantoprazole (PROTONIX) 40 MG tablet Take 1 tablet (40 mg total) by mouth 2 (two) times daily. Take  1 tablet  Daily  to prevent  Indigestion & Acid Reflux 06/28/23   Alan Mulder, MD  Probiotic Product (PROBIOTIC DAILY PO) Take 1 tablet by mouth daily.    [provider]  sucralfate (CARAFATE) 1 g tablet Take 2  tablets (2 g total) by mouth 4 (four) times daily -  with meals and at bedtime. 06/28/23   Alan Mulder, MD  zinc gluconate 50 MG tablet Take 50 mg by mouth daily.    [provider]      Allergies    Cephalosporins and Keflex [cephalexin]    Review of Systems   Review of Systems  Physical Exam Updated Vital Signs BP 121/61 (BP Location: Right Arm)   Pulse 82   Temp 98.6 F (37 C)   Resp 18   Ht 5\' 10"  (1.778 m)   Wt 103.7 kg   SpO2 94%   BMI 32.80 kg/m  Physical Exam Vitals and nursing note reviewed.  Constitutional:      General: She is not in acute distress.    Appearance: She is well-developed. She is not diaphoretic.  HENT:     Head: Normocephalic and atraumatic.  Eyes:     Pupils: Pupils are equal, round, and reactive to light.  Cardiovascular:     Rate and Rhythm: Normal rate and regular rhythm.     Heart sounds: No murmur heard.    No friction rub. No gallop.  Pulmonary:     Effort: Pulmonary effort is normal.     Breath sounds: No wheezing or rales.  Abdominal:     General: There is no distension.     Palpations: Abdomen is soft.     Tenderness: There is no abdominal tenderness.     Comments: Benign abdominal exam  Musculoskeletal:        General: No tenderness.     Cervical back: Normal range of motion and neck supple.  Skin:    General: Skin is warm and dry.  Neurological:     Mental Status: She is alert and oriented to person, place, and time.  Psychiatric:        Behavior: Behavior normal.     ED Results / Procedures / Treatments   Labs (all labs ordered are listed, but only abnormal results are displayed) Labs Reviewed  COMPREHENSIVE METABOLIC PANEL WITH GFR - Abnormal; Notable for the following components:      Result Value   Glucose, Bld 119 (*)    Creatinine, Ser 1.03 (*)    Total Protein 6.2 (*)    GFR, Estimated 54 (*)    All other components within normal limits  CBC - Abnormal; Notable for the following components:    WBC 12.2 (*)    All other components within normal limits  POC OCCULT BLOOD, ED  TYPE AND SCREEN  ABO/RH  EKG None  Radiology CT ANGIO GI BLEED Result Date: 06/28/2023 CLINICAL DATA:  Upper abdominal pain, evaluate for GI bleeding EXAM: CTA ABDOMEN AND PELVIS WITHOUT AND WITH CONTRAST TECHNIQUE: Multidetector CT imaging of the abdomen and pelvis was performed using the standard protocol during bolus administration of intravenous contrast. Multiplanar reconstructed images and MIPs were obtained and reviewed to evaluate the vascular anatomy. RADIATION DOSE REDUCTION: This exam was performed according to the departmental dose-optimization program which includes automated exposure control, adjustment of the mA and/or kV according to patient size and/or use of iterative reconstruction technique. CONTRAST:  OMNIPAQUE IOHEXOL 350 MG/ML SOLN COMPARISON:  05/31/2023 FINDINGS: VASCULAR Aorta: Atherosclerotic calcifications of the aorta are noted without aneurysmal dilatation or dissection. 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 Collateral flow from the SMA to the celiac axis branches is noted. SMA: Within normal limits. Renals: Both renal arteries are patent without evidence of aneurysm, dissection, vasculitis, fibromuscular dysplasia or significant stenosis. IMA: Patent without evidence of aneurysm, dissection, vasculitis or significant stenosis. Inflow: Iliacs are within normal limits. Veins: No specific venous abnormality is noted. Review of the MIP images confirms the above findings. NON-VASCULAR Lower chest: Lung bases demonstrate compressive atelectasis in the right lower lobe secondary to large hiatal hernia. Hepatobiliary: Liver shows multiple cystic lesions which are stable in appearance from the prior exam. Gallbladder is well distended with stones within. Common bile duct has increased in size when compared with the prior exam. This measures up to 14 mm. There is a  suggestion of a tiny distal stone in the coronal image number 39 of series 7. Correlation with laboratory values is recommended. Pancreas: Unremarkable. No pancreatic ductal dilatation or surrounding inflammatory changes. Spleen: Normal in size without focal abnormality. Adrenals/Urinary Tract: Adrenal glands are within normal limits. Kidneys demonstrate a normal enhancement pattern bilaterally. An exophytic fatty lesion from the inferior aspect of the right kidney is again seen and stable measuring up to 4.1 cm. This is consistent with a angiomyolipoma. The bladder is decompressed. Stomach/Bowel: Large hiatal hernia is identified within the right chest. Scattered diverticular change of the colon is seen. No obstructive or inflammatory changes of the colon are noted. A loop of ascending colon extends through a anterior abdominal wall spigelian hernia lateral to the right rectus muscle. This is stable in appearance from the prior exam. No obstructive changes are seen. Small bowel is within normal limits. No pooling of contrast is identified to suggest GI hemorrhage. The appendix is not well visualized. No inflammatory changes to suggest appendicitis are noted. Lymphatic: No lymphadenopathy is noted. Reproductive: Status post hysterectomy. No adnexal masses. Other: No abdominal wall hernia or abnormality. No abdominopelvic ascites. Musculoskeletal: No acute or significant osseous findings. IMPRESSION: VASCULAR No evidence of active GI hemorrhage. 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 Large hiatal hernia. Diverticulosis without diverticulitis. Electronically Signed   By: Violeta Grey M.D.   On: 06/28/2023 20:41   DG Chest Port 1 View Result Date: 06/28/2023 CLINICAL DATA:  Upper abdominal pain, sepsis EXAM: PORTABLE CHEST 1 VIEW COMPARISON:  08/31/2022 FINDINGS: Large density in the right lower chest favoring  hiatal hernia and adjacent passive atelectasis. Reverse lordotic projection. Upper normal heart size. The left lung appears clear. Atherosclerotic calcification of the aortic arch. IMPRESSION: 1. Large hiatal hernia with adjacent passive atelectasis in the right lower chest. 2. Upper normal heart size. 3. Aortic Atherosclerosis (ICD10-I70.0). Electronically  Signed   By: Freida Jes M.D.   On: 06/28/2023 20:17    Procedures Procedures    Medications Ordered in ED Medications  pantoprazole (PROTONIX) injection 40 mg (has no administration in time range)  ondansetron (ZOFRAN) injection 4 mg (has no administration in time range)  ondansetron (ZOFRAN-ODT) disintegrating tablet 4 mg (4 mg Oral Given 06/29/23 1214)  sodium chloride 0.9 % bolus 1,000 mL (1,000 mLs Intravenous New Bag/Given 06/29/23 1320)  metoCLOPramide (REGLAN) injection 5 mg (5 mg Intravenous Given 06/29/23 1321)  diphenhydrAMINE (BENADRYL) injection 12.5 mg (12.5 mg Intravenous Given 06/29/23 1322)  pantoprazole (PROTONIX) injection 40 mg (40 mg Intravenous Given 06/29/23 1322)    ED Course/ Medical Decision Making/ A&P                                 Medical Decision Making Amount and/or Complexity of Data Reviewed Labs: ordered.  Risk Prescription drug management.   83 yo F with a chief complaint of nausea vomiting and diarrhea.  Going on for the past week.  Diarrhea has improved but having intractable nausea and vomiting now.  Went to [facility], found to have a stable hemoglobin but some concern for acute bleeding with dark emesis.  Seen by hospitalist there but thought not to benefit from admission had planned for outpatient endoscopy however when they called to to confirm they were instructed to come to the ED for evaluation.  Patient's records were reviewed.  Had recent CT scan done yesterday without obvious acute bleeding.  Large hiatal hernia.  Hemoglobin is stable.  Will discuss with GI.  I discussed case  with Mai Schwalbe, NP.  Thought made the most sense for hospitalist admission with endoscopy in the hospital setting.  The patients results and plan were reviewed and discussed.   Any x-rays performed were independently reviewed by myself.   Differential diagnosis were considered with the presenting HPI.  Medications  pantoprazole (PROTONIX) injection 40 mg (has no administration in time range)  ondansetron (ZOFRAN) injection 4 mg (has no administration in time range)  ondansetron (ZOFRAN-ODT) disintegrating tablet 4 mg (4 mg Oral Given 06/29/23 1214)  sodium chloride 0.9 % bolus 1,000 mL (1,000 mLs Intravenous New Bag/Given 06/29/23 1320)  metoCLOPramide (REGLAN) injection 5 mg (5 mg Intravenous Given 06/29/23 1321)  diphenhydrAMINE (BENADRYL) injection 12.5 mg (12.5 mg Intravenous Given 06/29/23 1322)  pantoprazole (PROTONIX) injection 40 mg (40 mg Intravenous Given 06/29/23 1322)    Vitals:   06/29/23 1151 06/29/23 1202  BP: 121/61   Pulse: 82   Resp: 18   Temp: 98.6 F (37 C)   SpO2: 94%   Weight:  103.7 kg  Height:  5\' 10"  (1.778 m)    Final diagnoses:  Intractable nausea and vomiting    Admission/ observation were discussed with the admitting physician, patient and/or family and they are comfortable with the plan.          Final Clinical Impression(s) / ED Diagnoses Final diagnoses:  Intractable nausea and vomiting    Rx / DC Orders ED Discharge Orders     None         Albertus Hughs, DO 06/29/23 1431

## 2023-06-29 NOTE — Telephone Encounter (Addendum)
 Phoned patient back and spoke to her daughter. Informed her that we are not equipped to handle active GI bleeed here at [facility]  and Dr.McGreal wants patient to go to ED today to be treated .Daughter agreed and plans to take patient to [facility] ED.

## 2023-06-29 NOTE — ED Triage Notes (Signed)
 Pt. Having upper abdominal pain feels like tight band around, intermittent.  Pain began  1 1/2 weeks.  Pt. Is having vomiting, diarrhea and nausea. Pt. Was informed to have an endoscopy tomorrow, but when they called GI [facility] they said they do not do GI bleeds.  The suggested to go directly to the ED by Dr. Eugenia Hess.  Pt. Was seen at [facility] yesterday and was suppose to be admitted .  The hospitalist said pt. Needed to be discharged to home and have endoscopy tomorrow.    Pt. Is vomiting black blood each time she attempts to take in fluids or food Last meal was yesterday at lunch.

## 2023-06-29 NOTE — Telephone Encounter (Signed)
 Inbound call from patient's daughter stating patient has been vomiting black blood. States she went to the [facility] emergency room last night and was advised that patient has a upper GI bleed. Does not wish to cancel EGD scheduled for tomorrow 4/16. Daughter is requesting a call back. Please advise, thank you.

## 2023-06-29 NOTE — Telephone Encounter (Signed)
 Phoned patient back and spoke with her daughter Darlene [name]. She is patient POA and is currently with patient. She reports that patient was discharged from ED last night and was told " that nothing can be done because patient has not been off of Eliquis long enough". Patient took last dose of Eliquis on Sunday 06/27/23 at 0700pm. She reports still vomiting blood every time she takes a sip of water, no bloody stools.  Advised daughter to take patient back to the ED. Daughter declined. Please advise.

## 2023-06-29 NOTE — ED Provider Triage Note (Signed)
 Emergency Medicine Provider Triage Evaluation Note  Darlene [name] , a 83 y.o. female  was evaluated in triage.  Pt complains of upper GI bleed. Notes coffee ground emesis for the past 1.5 weeks. Also abdominal pain in band-like distribution across her abdomen. Was seen at [facility] yesterday for this and initial plan was for admission, however since she is on Eliquis she was told she would have to wait to have management until she had been off this medication for a few days and therefore she elected to do this outpatient. Plan was for EGD tomorrow, however she states now she is unable to tolerate po intake without dark emesis. Called GI who told her to come here. Notes dark stools as well. She last took her Eliquis Sunday night 4/13. Does note history of similar problems previously. Pain is the same as it has been.  Review of Systems  Positive:  Negative:   Physical Exam  BP 121/61 (BP Location: Right Arm)   Pulse 82   Temp 98.6 F (37 C)   Resp 18   Ht 5\' 10"  (1.778 m)   Wt 103.7 kg   SpO2 94%   BMI 32.80 kg/m  Gen:   Awake, no distress   Resp:  Normal effort  MSK:   Moves extremities without difficulty  Other:    Medical Decision Making  Medically screening exam initiated at 12:31 PM.  Appropriate orders placed.  Darlene [name] was informed that the remainder of the evaluation will be completed by another provider, this initial triage assessment does not replace that evaluation, and the importance of remaining in the ED until their evaluation is complete.  Chart reviewed, patient had CTA yesterday that showed no active hemorrhage. Work-up initiated.   Sherra Dk, PA-C 06/29/23 1236

## 2023-06-30 ENCOUNTER — Encounter: Admitting: Pediatrics

## 2023-06-30 ENCOUNTER — Inpatient Hospital Stay (HOSPITAL_COMMUNITY)

## 2023-06-30 ENCOUNTER — Encounter (HOSPITAL_COMMUNITY): Payer: Self-pay | Admitting: Certified Registered Nurse Anesthetist

## 2023-06-30 ENCOUNTER — Other Ambulatory Visit: Payer: Self-pay

## 2023-06-30 ENCOUNTER — Encounter (HOSPITAL_COMMUNITY)
Admission: EM | Disposition: A | Discharge status: Skilled Nursing Facility | Payer: Self-pay | Source: Home / Self Care | Attending: Internal Medicine

## 2023-06-30 ENCOUNTER — Encounter (HOSPITAL_COMMUNITY): Payer: Self-pay | Admitting: Family Medicine

## 2023-06-30 ENCOUNTER — Observation Stay (HOSPITAL_COMMUNITY)

## 2023-06-30 DIAGNOSIS — J90 Pleural effusion, not elsewhere classified: Secondary | ICD-10-CM | POA: Diagnosis not present

## 2023-06-30 DIAGNOSIS — E870 Hyperosmolality and hypernatremia: Secondary | ICD-10-CM | POA: Diagnosis not present

## 2023-06-30 DIAGNOSIS — I2729 Other secondary pulmonary hypertension: Secondary | ICD-10-CM | POA: Diagnosis not present

## 2023-06-30 DIAGNOSIS — J9602 Acute respiratory failure with hypercapnia: Secondary | ICD-10-CM

## 2023-06-30 DIAGNOSIS — A419 Sepsis, unspecified organism: Secondary | ICD-10-CM | POA: Diagnosis not present

## 2023-06-30 DIAGNOSIS — R1031 Right lower quadrant pain: Secondary | ICD-10-CM

## 2023-06-30 DIAGNOSIS — R1310 Dysphagia, unspecified: Secondary | ICD-10-CM | POA: Diagnosis not present

## 2023-06-30 DIAGNOSIS — F32A Depression, unspecified: Secondary | ICD-10-CM | POA: Diagnosis present

## 2023-06-30 DIAGNOSIS — I480 Paroxysmal atrial fibrillation: Secondary | ICD-10-CM

## 2023-06-30 DIAGNOSIS — J810 Acute pulmonary edema: Secondary | ICD-10-CM | POA: Diagnosis not present

## 2023-06-30 DIAGNOSIS — J690 Pneumonitis due to inhalation of food and vomit: Secondary | ICD-10-CM | POA: Diagnosis present

## 2023-06-30 DIAGNOSIS — T17908A Unspecified foreign body in respiratory tract, part unspecified causing other injury, initial encounter: Secondary | ICD-10-CM | POA: Diagnosis not present

## 2023-06-30 DIAGNOSIS — J9601 Acute respiratory failure with hypoxia: Secondary | ICD-10-CM | POA: Diagnosis present

## 2023-06-30 DIAGNOSIS — I4891 Unspecified atrial fibrillation: Secondary | ICD-10-CM | POA: Diagnosis not present

## 2023-06-30 DIAGNOSIS — T182XXA Foreign body in stomach, initial encounter: Secondary | ICD-10-CM | POA: Diagnosis present

## 2023-06-30 DIAGNOSIS — K439 Ventral hernia without obstruction or gangrene: Secondary | ICD-10-CM | POA: Diagnosis not present

## 2023-06-30 DIAGNOSIS — I952 Hypotension due to drugs: Secondary | ICD-10-CM | POA: Diagnosis not present

## 2023-06-30 DIAGNOSIS — R112 Nausea with vomiting, unspecified: Secondary | ICD-10-CM | POA: Diagnosis not present

## 2023-06-30 DIAGNOSIS — G4733 Obstructive sleep apnea (adult) (pediatric): Secondary | ICD-10-CM | POA: Diagnosis not present

## 2023-06-30 DIAGNOSIS — I272 Pulmonary hypertension, unspecified: Secondary | ICD-10-CM | POA: Diagnosis present

## 2023-06-30 DIAGNOSIS — J918 Pleural effusion in other conditions classified elsewhere: Secondary | ICD-10-CM | POA: Diagnosis present

## 2023-06-30 DIAGNOSIS — E66811 Obesity, class 1: Secondary | ICD-10-CM | POA: Diagnosis present

## 2023-06-30 DIAGNOSIS — K922 Gastrointestinal hemorrhage, unspecified: Secondary | ICD-10-CM

## 2023-06-30 DIAGNOSIS — Z4659 Encounter for fitting and adjustment of other gastrointestinal appliance and device: Secondary | ICD-10-CM | POA: Diagnosis not present

## 2023-06-30 DIAGNOSIS — E872 Acidosis, unspecified: Secondary | ICD-10-CM | POA: Diagnosis present

## 2023-06-30 DIAGNOSIS — I4892 Unspecified atrial flutter: Secondary | ICD-10-CM | POA: Diagnosis not present

## 2023-06-30 DIAGNOSIS — K7689 Other specified diseases of liver: Secondary | ICD-10-CM | POA: Diagnosis present

## 2023-06-30 DIAGNOSIS — D539 Nutritional anemia, unspecified: Secondary | ICD-10-CM | POA: Diagnosis present

## 2023-06-30 DIAGNOSIS — K2970 Gastritis, unspecified, without bleeding: Secondary | ICD-10-CM | POA: Diagnosis not present

## 2023-06-30 DIAGNOSIS — I4811 Longstanding persistent atrial fibrillation: Secondary | ICD-10-CM

## 2023-06-30 DIAGNOSIS — E871 Hypo-osmolality and hyponatremia: Secondary | ICD-10-CM | POA: Diagnosis present

## 2023-06-30 DIAGNOSIS — I10 Essential (primary) hypertension: Secondary | ICD-10-CM | POA: Diagnosis present

## 2023-06-30 DIAGNOSIS — R578 Other shock: Secondary | ICD-10-CM | POA: Diagnosis not present

## 2023-06-30 DIAGNOSIS — G928 Other toxic encephalopathy: Secondary | ICD-10-CM | POA: Diagnosis not present

## 2023-06-30 DIAGNOSIS — R6521 Severe sepsis with septic shock: Secondary | ICD-10-CM | POA: Diagnosis not present

## 2023-06-30 DIAGNOSIS — F05 Delirium due to known physiological condition: Secondary | ICD-10-CM | POA: Diagnosis not present

## 2023-06-30 DIAGNOSIS — G4731 Primary central sleep apnea: Secondary | ICD-10-CM | POA: Diagnosis not present

## 2023-06-30 DIAGNOSIS — K449 Diaphragmatic hernia without obstruction or gangrene: Secondary | ICD-10-CM

## 2023-06-30 DIAGNOSIS — K920 Hematemesis: Secondary | ICD-10-CM | POA: Diagnosis not present

## 2023-06-30 DIAGNOSIS — R197 Diarrhea, unspecified: Secondary | ICD-10-CM

## 2023-06-30 DIAGNOSIS — K219 Gastro-esophageal reflux disease without esophagitis: Secondary | ICD-10-CM | POA: Diagnosis not present

## 2023-06-30 DIAGNOSIS — K5909 Other constipation: Secondary | ICD-10-CM

## 2023-06-30 DIAGNOSIS — W449XXA Unspecified foreign body entering into or through a natural orifice, initial encounter: Secondary | ICD-10-CM | POA: Diagnosis present

## 2023-06-30 DIAGNOSIS — E785 Hyperlipidemia, unspecified: Secondary | ICD-10-CM | POA: Diagnosis present

## 2023-06-30 DIAGNOSIS — K440 Diaphragmatic hernia with obstruction, without gangrene: Secondary | ICD-10-CM | POA: Diagnosis present

## 2023-06-30 DIAGNOSIS — K254 Chronic or unspecified gastric ulcer with hemorrhage: Secondary | ICD-10-CM | POA: Diagnosis present

## 2023-06-30 LAB — POCT I-STAT 7, (LYTES, BLD GAS, ICA,H+H)
Acid-Base Excess: 2 mmol/L (ref 0.0–2.0)
Acid-Base Excess: 4 mmol/L — ABNORMAL HIGH (ref 0.0–2.0)
Bicarbonate: 31.4 mmol/L — ABNORMAL HIGH (ref 20.0–28.0)
Bicarbonate: 29 mmol/L — ABNORMAL HIGH (ref 20.0–28.0)
Calcium, Ion: 1.25 mmol/L (ref 1.15–1.40)
Calcium, Ion: 1.17 mmol/L (ref 1.15–1.40)
HCT: 43 % (ref 36.0–46.0)
HCT: 36 % (ref 36.0–46.0)
Hemoglobin: 14.6 g/dL (ref 12.0–15.0)
Hemoglobin: 12.2 g/dL (ref 12.0–15.0)
O2 Saturation: 81 %
O2 Saturation: 95 %
Patient temperature: 97.9
Patient temperature: 96.91
Potassium: 3.3 mmol/L — ABNORMAL LOW (ref 3.5–5.1)
Potassium: 3.2 mmol/L — ABNORMAL LOW (ref 3.5–5.1)
Sodium: 142 mmol/L (ref 135–145)
Sodium: 142 mmol/L (ref 135–145)
TCO2: 34 mmol/L — ABNORMAL HIGH (ref 22–32)
TCO2: 30 mmol/L (ref 22–32)
pCO2 arterial: 69.2 mmHg (ref 32–48)
pCO2 arterial: 41.7 mmHg (ref 32–48)
pH, Arterial: 7.263 — ABNORMAL LOW (ref 7.35–7.45)
pH, Arterial: 7.445 (ref 7.35–7.45)
pO2, Arterial: 52 mmHg — ABNORMAL LOW (ref 83–108)
pO2, Arterial: 69 mmHg — ABNORMAL LOW (ref 83–108)

## 2023-06-30 LAB — MAGNESIUM
Magnesium: 1.8 mg/dL (ref 1.7–2.4)
Magnesium: 1.5 mg/dL — ABNORMAL LOW (ref 1.7–2.4)

## 2023-06-30 LAB — CBC
HCT: 37.6 % (ref 36.0–46.0)
HCT: 38.8 % (ref 36.0–46.0)
HCT: 39.3 % (ref 36.0–46.0)
Hemoglobin: 12.7 g/dL (ref 12.0–15.0)
Hemoglobin: 12.9 g/dL (ref 12.0–15.0)
Hemoglobin: 13.2 g/dL (ref 12.0–15.0)
MCH: 32.2 pg (ref 26.0–34.0)
MCH: 32.3 pg (ref 26.0–34.0)
MCH: 32.4 pg (ref 26.0–34.0)
MCHC: 33.2 g/dL (ref 30.0–36.0)
MCHC: 33.6 g/dL (ref 30.0–36.0)
MCHC: 33.8 g/dL (ref 30.0–36.0)
MCV: 95.2 fL (ref 80.0–100.0)
MCV: 96.6 fL (ref 80.0–100.0)
MCV: 97.2 fL (ref 80.0–100.0)
Platelets: 200 10*3/uL (ref 150–400)
Platelets: 201 10*3/uL (ref 150–400)
Platelets: 207 10*3/uL (ref 150–400)
RBC: 3.95 MIL/uL (ref 3.87–5.11)
RBC: 3.99 MIL/uL (ref 3.87–5.11)
RBC: 4.07 MIL/uL (ref 3.87–5.11)
RDW: 13.1 % (ref 11.5–15.5)
RDW: 13.1 % (ref 11.5–15.5)
RDW: 13.1 % (ref 11.5–15.5)
WBC: 7.4 10*3/uL (ref 4.0–10.5)
WBC: 9.2 10*3/uL (ref 4.0–10.5)
WBC: 9.9 10*3/uL (ref 4.0–10.5)
nRBC: 0 % (ref 0.0–0.2)
nRBC: 0 % (ref 0.0–0.2)
nRBC: 0 % (ref 0.0–0.2)

## 2023-06-30 LAB — ECHOCARDIOGRAM COMPLETE
Height: 70 in
S' Lateral: 2.7 cm
Weight: 3375.68 [oz_av]

## 2023-06-30 LAB — MRSA NEXT GEN BY PCR, NASAL: MRSA by PCR Next Gen: NOT DETECTED

## 2023-06-30 LAB — BRAIN NATRIURETIC PEPTIDE: B Natriuretic Peptide: 231.2 pg/mL — ABNORMAL HIGH (ref 0.0–100.0)

## 2023-06-30 LAB — BASIC METABOLIC PANEL WITH GFR
Anion gap: 12 (ref 5–15)
Anion gap: 12 (ref 5–15)
BUN: 19 mg/dL (ref 8–23)
BUN: 23 mg/dL (ref 8–23)
CO2: 26 mmol/L (ref 22–32)
CO2: 29 mmol/L (ref 22–32)
Calcium: 8.8 mg/dL — ABNORMAL LOW (ref 8.9–10.3)
Calcium: 8.4 mg/dL — ABNORMAL LOW (ref 8.9–10.3)
Chloride: 102 mmol/L (ref 98–111)
Chloride: 101 mmol/L (ref 98–111)
Creatinine, Ser: 0.83 mg/dL (ref 0.44–1.00)
Creatinine, Ser: 0.97 mg/dL (ref 0.44–1.00)
GFR, Estimated: >60 mL/min (ref >60)
GFR, Estimated: 58 mL/min — ABNORMAL LOW (ref >60)
Glucose, Bld: 126 mg/dL — ABNORMAL HIGH (ref 70–99)
Glucose, Bld: 159 mg/dL — ABNORMAL HIGH (ref 70–99)
Potassium: 3 mmol/L — ABNORMAL LOW (ref 3.5–5.1)
Potassium: 3.4 mmol/L — ABNORMAL LOW (ref 3.5–5.1)
Sodium: 140 mmol/L (ref 135–145)
Sodium: 142 mmol/L (ref 135–145)

## 2023-06-30 LAB — TROPONIN I (HIGH SENSITIVITY): Troponin I (High Sensitivity): 23 ng/L — ABNORMAL HIGH (ref <18)

## 2023-06-30 LAB — GLUCOSE, CAPILLARY
Glucose-Capillary: 116 mg/dL — ABNORMAL HIGH (ref 70–99)
Glucose-Capillary: 135 mg/dL — ABNORMAL HIGH (ref 70–99)
Glucose-Capillary: 148 mg/dL — ABNORMAL HIGH (ref 70–99)

## 2023-06-30 LAB — LACTIC ACID, PLASMA: Lactic Acid, Venous: 2.9 mmol/L (ref 0.5–1.9)

## 2023-06-30 LAB — PROTIME-INR
INR: 1.3 — ABNORMAL HIGH (ref 0.8–1.2)
Prothrombin Time: 15.9 s — ABNORMAL HIGH (ref 11.4–15.2)

## 2023-06-30 LAB — APTT: aPTT: 34 s (ref 24–36)

## 2023-06-30 LAB — PHOSPHORUS: Phosphorus: 3.7 mg/dL (ref 2.5–4.6)

## 2023-06-30 SURGERY — CANCELLED PROCEDURE

## 2023-06-30 MED ORDER — MIDAZOLAM HCL 2 MG/2ML IJ SOLN
INTRAMUSCULAR | Status: AC
  Filled 2023-06-30: qty 2

## 2023-06-30 MED ORDER — FUROSEMIDE 10 MG/ML IJ SOLN
40.0000 mg | Freq: Two times a day (BID) | INTRAMUSCULAR | Status: DC
  Administered 2023-06-30 – 2023-07-01 (×2): 40 mg via INTRAVENOUS
  Filled 2023-06-30 (×3): qty 4

## 2023-06-30 MED ORDER — DILTIAZEM HCL-DEXTROSE 125-5 MG/125ML-% IV SOLN (PREMIX)
5.0000 mg/h | INTRAVENOUS | Status: DC
  Administered 2023-06-30: 5 mg/h via INTRAVENOUS
  Filled 2023-06-30: qty 125

## 2023-06-30 MED ORDER — CHLORHEXIDINE GLUCONATE CLOTH 2 % EX PADS
6.0000 | MEDICATED_PAD | Freq: Every day | CUTANEOUS | Status: DC
  Administered 2023-06-30 – 2023-07-29 (×30): 6 via TOPICAL

## 2023-06-30 MED ORDER — ALBUMIN HUMAN 25 % IV SOLN
25.0000 g | Freq: Four times a day (QID) | INTRAVENOUS | Status: AC
Start: 2023-06-30 — End: 2023-07-01
  Administered 2023-06-30 – 2023-07-01 (×4): 25 g via INTRAVENOUS
  Filled 2023-06-30 (×4): qty 100

## 2023-06-30 MED ORDER — FUROSEMIDE 10 MG/ML IJ SOLN
40.0000 mg | Freq: Once | INTRAMUSCULAR | Status: AC
  Administered 2023-06-30: 40 mg via INTRAVENOUS

## 2023-06-30 MED ORDER — PROCHLORPERAZINE EDISYLATE 10 MG/2ML IJ SOLN
5.0000 mg | Freq: Once | INTRAMUSCULAR | Status: AC
  Administered 2023-06-30: 5 mg via INTRAVENOUS
  Filled 2023-06-30: qty 2

## 2023-06-30 MED ORDER — FAMOTIDINE 20 MG PO TABS
20.0000 mg | ORAL_TABLET | Freq: Two times a day (BID) | ORAL | Status: DC
  Administered 2023-06-30 – 2023-07-01 (×2): 20 mg
  Filled 2023-06-30 (×3): qty 1

## 2023-06-30 MED ORDER — ROCURONIUM BROMIDE 10 MG/ML (PF) SYRINGE
100.0000 mg | PREFILLED_SYRINGE | Freq: Once | INTRAVENOUS | Status: AC
Start: 2023-06-30 — End: 2023-06-30

## 2023-06-30 MED ORDER — ORAL CARE MOUTH RINSE: 15.0000 mL | OROMUCOSAL | Status: DC | PRN

## 2023-06-30 MED ORDER — ORAL CARE MOUTH RINSE
15.0000 mL | OROMUCOSAL | Status: DC
  Administered 2023-06-30 – 2023-07-04 (×51): 15 mL via OROMUCOSAL

## 2023-06-30 MED ORDER — SUCCINYLCHOLINE CHLORIDE 200 MG/10ML IV SOSY
PREFILLED_SYRINGE | INTRAVENOUS | Status: AC
Start: 2023-06-30 — End: 2023-07-01
  Filled 2023-06-30: qty 10

## 2023-06-30 MED ORDER — PHENYLEPHRINE 80 MCG/ML (10ML) SYRINGE FOR IV PUSH (FOR BLOOD PRESSURE SUPPORT)
PREFILLED_SYRINGE | INTRAVENOUS | Status: AC
  Administered 2023-06-30: 200 ug via INTRAVENOUS
  Filled 2023-06-30: qty 10

## 2023-06-30 MED ORDER — ETOMIDATE 2 MG/ML IV SOLN
INTRAVENOUS | Status: AC
  Administered 2023-06-30: 20 mg via INTRAVENOUS
  Filled 2023-06-30: qty 20

## 2023-06-30 MED ORDER — NOREPINEPHRINE 4 MG/250ML-% IV SOLN
0.0000 ug/min | INTRAVENOUS | Status: DC
  Administered 2023-06-30: 10 ug/min via INTRAVENOUS
  Filled 2023-06-30: qty 250

## 2023-06-30 MED ORDER — LACTATED RINGERS IV BOLUS
2000.0000 mL | Freq: Once | INTRAVENOUS | Status: AC
  Administered 2023-06-30: 2000 mL via INTRAVENOUS

## 2023-06-30 MED ORDER — PIPERACILLIN-TAZOBACTAM 3.375 G IVPB
3.3750 g | Freq: Three times a day (TID) | INTRAVENOUS | Status: AC
  Administered 2023-06-30 – 2023-07-02 (×7): 3.375 g via INTRAVENOUS
  Filled 2023-06-30 (×6): qty 50

## 2023-06-30 MED ORDER — IOHEXOL 350 MG/ML SOLN
65.0000 mL | Freq: Once | INTRAVENOUS | Status: AC | PRN
  Administered 2023-06-30: 65 mL via INTRAVENOUS

## 2023-06-30 MED ORDER — FENTANYL 2500MCG IN NS 250ML (10MCG/ML) PREMIX INFUSION
25.0000 ug/h | INTRAVENOUS | Status: DC
  Administered 2023-06-30: 100 ug/h via INTRAVENOUS
  Administered 2023-07-01 – 2023-07-02 (×4): 200 ug/h via INTRAVENOUS
  Administered 2023-07-03: 125 ug/h via INTRAVENOUS
  Administered 2023-07-03: 200 ug/h via INTRAVENOUS
  Filled 2023-06-30 (×7): qty 250

## 2023-06-30 MED ORDER — PHENYLEPHRINE 80 MCG/ML (10ML) SYRINGE FOR IV PUSH (FOR BLOOD PRESSURE SUPPORT): 80.0000 ug | PREFILLED_SYRINGE | Freq: Once | INTRAVENOUS | Status: AC | PRN

## 2023-06-30 MED ORDER — POTASSIUM CHLORIDE 10 MEQ/100ML IV SOLN
10.0000 meq | INTRAVENOUS | Status: AC
  Administered 2023-06-30 (×4): 10 meq via INTRAVENOUS
  Filled 2023-06-30 (×3): qty 100

## 2023-06-30 MED ORDER — MAGNESIUM SULFATE 4 GM/100ML IV SOLN
4.0000 g | Freq: Once | INTRAVENOUS | Status: AC
  Administered 2023-06-30: 4 g via INTRAVENOUS
  Filled 2023-06-30: qty 100

## 2023-06-30 MED ORDER — KETAMINE HCL 50 MG/5ML IJ SOSY
PREFILLED_SYRINGE | INTRAMUSCULAR | Status: AC
  Filled 2023-06-30: qty 10

## 2023-06-30 MED ORDER — POTASSIUM CHLORIDE 10 MEQ/100ML IV SOLN
10.0000 meq | INTRAVENOUS | Status: AC
  Administered 2023-06-30 (×4): 10 meq via INTRAVENOUS
  Filled 2023-06-30 (×4): qty 100

## 2023-06-30 MED ORDER — VASOPRESSIN 20 UNITS/100 ML INFUSION FOR SHOCK: 0.0400 [IU]/min | INTRAVENOUS | Status: DC

## 2023-06-30 MED ORDER — FENTANYL CITRATE PF 50 MCG/ML IJ SOSY: 50.0000 ug | PREFILLED_SYRINGE | Freq: Once | INTRAMUSCULAR | Status: AC

## 2023-06-30 MED ORDER — SODIUM BICARBONATE 8.4 % IV SOLN
100.0000 meq | Freq: Once | INTRAVENOUS | Status: AC
Start: 2023-06-30 — End: 2023-06-30
  Administered 2023-06-30: 100 meq via INTRAVENOUS

## 2023-06-30 MED ORDER — DILTIAZEM HCL ER COATED BEADS 300 MG PO CP24
300.0000 mg | ORAL_CAPSULE | Freq: Every day | ORAL | Status: DC
  Administered 2023-06-30: 300 mg via ORAL
  Filled 2023-06-30: qty 1

## 2023-06-30 MED ORDER — DEXMEDETOMIDINE HCL IN NACL 400 MCG/100ML IV SOLN
0.0000 ug/kg/h | INTRAVENOUS | Status: DC
  Administered 2023-06-30: 0.4 ug/kg/h via INTRAVENOUS
  Administered 2023-06-30: 0.8 ug/kg/h via INTRAVENOUS
  Administered 2023-07-01 (×2): 1 ug/kg/h via INTRAVENOUS
  Administered 2023-07-01: 0.9 ug/kg/h via INTRAVENOUS
  Administered 2023-07-01 – 2023-07-02 (×5): 1 ug/kg/h via INTRAVENOUS
  Administered 2023-07-02: 1.2 ug/kg/h via INTRAVENOUS
  Administered 2023-07-02: 1 ug/kg/h via INTRAVENOUS
  Administered 2023-07-02: 1.1 ug/kg/h via INTRAVENOUS
  Administered 2023-07-03: 0.6 ug/kg/h via INTRAVENOUS
  Administered 2023-07-03: 0.8 ug/kg/h via INTRAVENOUS
  Administered 2023-07-03: 0.7 ug/kg/h via INTRAVENOUS
  Administered 2023-07-04: 0.4 ug/kg/h via INTRAVENOUS
  Administered 2023-07-04: 0.5 ug/kg/h via INTRAVENOUS
  Filled 2023-06-30 (×17): qty 100

## 2023-06-30 MED ORDER — NOREPINEPHRINE 4 MG/250ML-% IV SOLN
INTRAVENOUS | Status: AC
  Administered 2023-06-30: 10 ug/min via INTRAVENOUS
  Filled 2023-06-30: qty 250

## 2023-06-30 MED ORDER — FENTANYL CITRATE PF 50 MCG/ML IJ SOSY
PREFILLED_SYRINGE | INTRAMUSCULAR | Status: AC
Start: 2023-06-30 — End: 2023-06-30
  Administered 2023-06-30: 50 ug via INTRAVENOUS
  Filled 2023-06-30: qty 2

## 2023-06-30 MED ORDER — FENTANYL CITRATE PF 50 MCG/ML IJ SOSY: 25.0000 ug | PREFILLED_SYRINGE | Freq: Once | INTRAMUSCULAR | Status: DC

## 2023-06-30 MED ORDER — LACTATED RINGERS IV BOLUS
500.0000 mL | Freq: Once | INTRAVENOUS | Status: AC
  Administered 2023-06-30: 500 mL via INTRAVENOUS

## 2023-06-30 MED ORDER — METOPROLOL TARTRATE 5 MG/5ML IV SOLN
5.0000 mg | Freq: Once | INTRAVENOUS | Status: AC
  Administered 2023-06-30: 5 mg via INTRAVENOUS
  Filled 2023-06-30: qty 5

## 2023-06-30 MED ORDER — ROCURONIUM BROMIDE 10 MG/ML (PF) SYRINGE
PREFILLED_SYRINGE | INTRAVENOUS | Status: AC
  Administered 2023-06-30: 100 mg via INTRAVENOUS
  Filled 2023-06-30: qty 10

## 2023-06-30 MED ORDER — DILTIAZEM LOAD VIA INFUSION
10.0000 mg | Freq: Once | INTRAVENOUS | Status: AC
  Administered 2023-06-30: 10 mg via INTRAVENOUS
  Filled 2023-06-30: qty 10

## 2023-06-30 MED ORDER — ETOMIDATE 2 MG/ML IV SOLN
20.0000 mg | Freq: Once | INTRAVENOUS | Status: AC
Start: 2023-06-30 — End: 2023-06-30

## 2023-06-30 MED ORDER — INSULIN ASPART 100 UNIT/ML IJ SOLN
0.0000 [IU] | INTRAMUSCULAR | Status: DC
  Administered 2023-06-30 – 2023-07-02 (×13): 1 [IU] via SUBCUTANEOUS
  Administered 2023-07-02: 2 [IU] via SUBCUTANEOUS
  Administered 2023-07-03 (×2): 1 [IU] via SUBCUTANEOUS
  Administered 2023-07-03 – 2023-07-04 (×6): 2 [IU] via SUBCUTANEOUS
  Administered 2023-07-04 – 2023-07-06 (×9): 1 [IU] via SUBCUTANEOUS

## 2023-06-30 MED ORDER — SODIUM BICARBONATE 8.4 % IV SOLN
100.0000 meq | Freq: Once | INTRAVENOUS | Status: AC
  Administered 2023-06-30: 100 meq via INTRAVENOUS

## 2023-06-30 MED ORDER — CHLORHEXIDINE GLUCONATE 0.12 % MT SOLN
15.0000 mL | Freq: Once | OROMUCOSAL | Status: AC
  Administered 2023-06-30: 15 mL via OROMUCOSAL

## 2023-06-30 MED ORDER — SODIUM BICARBONATE 8.4 % IV SOLN
INTRAVENOUS | Status: AC
  Filled 2023-06-30: qty 150

## 2023-06-30 MED ORDER — PIPERACILLIN-TAZOBACTAM 3.375 G IVPB 30 MIN
3.3750 g | Freq: Once | INTRAVENOUS | Status: AC
  Administered 2023-06-30: 3.375 g via INTRAVENOUS
  Filled 2023-06-30: qty 50

## 2023-06-30 MED ORDER — FENTANYL BOLUS VIA INFUSION
25.0000 ug | INTRAVENOUS | Status: DC | PRN
  Administered 2023-07-01 – 2023-07-02 (×2): 50 ug via INTRAVENOUS

## 2023-06-30 NOTE — H&P (View-Only) (Signed)
 [facility] Progress Note  CC: N/V, hematemesis, abdominal pain    Subjective: She is unresponsive, sedated on the vent.  Family at the bedside.  RN reported patient vomited up a large volume of brown emesis during intubation.  Objective:  Vital signs in last 24 hours: Temp:  [96.9 F (36.1 C)-98.7 F (37.1 C)] 96.9 F (36.1 C) (04/16 1550) Pulse Rate:  [54-141] 97 (04/16 1550) Resp:  [18-30] 24 (04/16 1550) BP: (62-158)/(39-112) 90/67 (04/16 1550) SpO2:  [72 %-98 %] 97 % (04/16 1550) FiO2 (%):  [100 %] 100 % (04/16 1550) Weight:  [95.7 kg] 95.7 kg (04/16 1316)   General: Critically ill appearing 83 year old female intubated, sedated.  Heart: Irregular rhythm Pulm: Breath sounds clear, slightly coarse on the vent. Abdomen: Soft, nondistended.  Hypoactive bowel sounds all 4 quadrants.  No obvious tenderness/rebound or guarding. Extremities:  No edema. Neurologic: Sedated on the vent, unresponsive.  Does not follow commands.  Intake/Output from previous day: 04/15 0701 - 04/16 0700 In: 1000 [IV Piggyback:1000] Out: -  Intake/Output this shift: No intake/output data recorded.  Lab Results: Recent Labs    06/30/23 0034 06/30/23 0632 06/30/23 1227 06/30/23 1549 06/30/23 1617  WBC 9.9 9.2  --   --  7.4  HGB 12.7 13.2 14.6 12.2 12.9  HCT 37.6 39.3 43.0 36.0 38.8  PLT 201 200  --   --  207   BMET Recent Labs    06/28/23 1806 06/29/23 1209 06/30/23 0632 06/30/23 1227 06/30/23 1549  NA 140 142 140 142 142  K 3.4* 3.5 3.0* 3.3* 3.2*  CL 101 103 102  --   --   CO2 28 27 26   --   --   GLUCOSE 126* 119* 126*  --   --   BUN 17 17 19   --   --   CREATININE 0.93 1.03* 0.83  --   --   CALCIUM 9.3 9.5 8.8*  --   --    LFT Recent Labs    06/29/23 1209  PROT 6.2*  ALBUMIN 3.7  AST 19  ALT 20  ALKPHOS 44  BILITOT 1.2   PT/INR Recent Labs    06/28/23 1806 06/30/23 0632  LABPROT 16.9* 15.9*  INR 1.4* 1.3*   Hepatitis Panel No results for  input(s): "HEPBSAG", "HCVAB", "HEPAIGM", "HEPBIGM" in the last 72 hours.  ECHOCARDIOGRAM COMPLETE Result Date: 06/30/2023    ECHOCARDIOGRAM REPORT   Patient Name:   Darlene [name] Date of Exam: 06/30/2023 Medical Rec #:  [id number]      Height:       70.0 in Accession #:    [id number]     Weight:       211.0 lb Date of Birth:  25-Nov-1940     BSA:          2.135 m Patient Age:    82 years       BP:           103/64 mmHg Patient Gender: F              HR:           90 bpm. Exam Location:  Inpatient Procedure: 2D Echo, Color Doppler and Cardiac Doppler (Both Spectral and Color            Flow Doppler were utilized during procedure). STAT ECHO Indications:    Shock R57.9  History:        Patient has prior history of  Echocardiogram examinations, most                 recent 04/16/2019. Pulmonary HTN, Arrythmias:Atrial Fibrillation;                 Risk Factors:Hypertension and Dyslipidemia.  Sonographer:    Irving Burton Senior RDCS Referring Phys: [id number] Lorin Glass  Sonographer Comments: Scanned post emergent intubation IMPRESSIONS  1. Left ventricular ejection fraction, by estimation, is 50 to 55%. The left ventricle has low normal function. The left ventricle demonstrates global hypokinesis. There is mild concentric left ventricular hypertrophy. Left ventricular diastolic function could not be evaluated.  2. Akiniesis of the RV free wall with tethering of the apex consistent with McConnell's sign and acute RV dsyfunction. Right ventricular systolic function is mildly reduced. The right ventricular size is mildly enlarged. There is moderately elevated pulmonary artery systolic pressure.  3. Left atrial size was moderately dilated.  4. A small pericardial effusion is present.  5. The mitral valve is normal in structure. Trivial mitral valve regurgitation.  6. Tricuspid valve regurgitation is moderate.  7. The aortic valve is tricuspid. Aortic valve regurgitation is trivial. Aortic valve sclerosis is present, with no  evidence of aortic valve stenosis.  8. The inferior vena cava is dilated in size with <50% respiratory variability, suggesting right atrial pressure of 15 mmHg. FINDINGS  Left Ventricle: Left ventricular ejection fraction, by estimation, is 50 to 55%. The left ventricle has low normal function. The left ventricle demonstrates global hypokinesis. The left ventricular internal cavity size was normal in size. There is mild concentric left ventricular hypertrophy. Left ventricular diastolic function could not be evaluated due to atrial fibrillation. Left ventricular diastolic function could not be evaluated. Right Ventricle: Akiniesis of the RV free wall with tethering of the apex consistent with McConnell's sign and acute RV dsyfunction. The right ventricular size is mildly enlarged. No increase in right ventricular wall thickness. Right ventricular systolic function is mildly reduced. There is moderately elevated pulmonary artery systolic pressure. The tricuspid regurgitant velocity is 2.81 m/s, and with an assumed right atrial pressure of 15 mmHg, the estimated right ventricular systolic pressure is 46.6 mmHg. Left Atrium: Left atrial size was moderately dilated. Right Atrium: Right atrial size was normal in size. Pericardium: A small pericardial effusion is present. Mitral Valve: The mitral valve is normal in structure. Trivial mitral valve regurgitation. Tricuspid Valve: The tricuspid valve is normal in structure. Tricuspid valve regurgitation is moderate. Aortic Valve: The aortic valve is tricuspid. Aortic valve regurgitation is trivial. Aortic valve sclerosis is present, with no evidence of aortic valve stenosis. Pulmonic Valve: The pulmonic valve was normal in structure. Pulmonic valve regurgitation is mild. Aorta: The aortic root and ascending aorta are structurally normal, with no evidence of dilitation. Venous: The inferior vena cava is dilated in size with less than 50% respiratory variability, suggesting  right atrial pressure of 15 mmHg. IAS/Shunts: No atrial level shunt detected by color flow Doppler.  LEFT VENTRICLE PLAX 2D LVIDd:         3.60 cm LVIDs:         2.70 cm LV PW:         1.30 cm LV IVS:        1.00 cm LVOT diam:     1.90 cm LV SV:         34 LV SV Index:   16 LVOT Area:     2.84 cm  RIGHT VENTRICLE RV S prime:  7.72 cm/s TAPSE (M-mode): 1.6 cm LEFT ATRIUM              Index        RIGHT ATRIUM           Index LA diam:        4.50 cm  2.11 cm/m   RA Area:     19.40 cm LA Vol (A2C):   64.3 ml  30.11 ml/m  RA Volume:   52.40 ml  24.54 ml/m LA Vol (A4C):   110.0 ml 51.52 ml/m LA Biplane Vol: 85.0 ml  39.81 ml/m  AORTIC VALVE LVOT Vmax:   78.25 cm/s LVOT Vmean:  52.000 cm/s LVOT VTI:    0.120 m  AORTA Ao Root diam: 2.50 cm Ao Asc diam:  2.80 cm TRICUSPID VALVE TR Peak grad:   31.6 mmHg TR Vmax:        281.00 cm/s  SHUNTS Systemic VTI:  0.12 m Systemic Diam: 1.90 cm Clearnce Hasten Electronically signed by Clearnce Hasten Signature Date/Time: 06/30/2023/3:04:09 PM    Final    DG Chest 1 View Result Date: 06/30/2023 CLINICAL DATA:  Hypoxia. EXAM: CHEST  1 VIEW COMPARISON:  06/28/2023. FINDINGS: Stable cardiomediastinal silhouette. Redemonstrated large hiatal hernia at the right base with adjacent atelectasis. Streaky opacities at the left lung base. No pneumothorax. Visualized osseous structures are unchanged. IMPRESSION: 1. Similar large hiatal hernia with adjacent atelectasis in the right lower lung. 2. Streaky opacities at the left lung base could reflect atelectasis or infiltrate. Electronically Signed   By: Hart Robinsons M.D.   On: 06/30/2023 12:57   CT ANGIO GI BLEED Result Date: 06/28/2023 CLINICAL DATA:  Upper abdominal pain, evaluate for GI bleeding EXAM: CTA ABDOMEN AND PELVIS WITHOUT AND WITH CONTRAST TECHNIQUE: Multidetector CT imaging of the abdomen and pelvis was performed using the standard protocol during bolus administration of intravenous contrast. Multiplanar  reconstructed images and MIPs were obtained and reviewed to evaluate the vascular anatomy. RADIATION DOSE REDUCTION: This exam was performed according to the departmental dose-optimization program which includes automated exposure control, adjustment of the mA and/or kV according to patient size and/or use of iterative reconstruction technique. CONTRAST:  OMNIPAQUE IOHEXOL 350 MG/ML SOLN COMPARISON:  05/31/2023 FINDINGS: VASCULAR Aorta: Atherosclerotic calcifications of the aorta are noted without aneurysmal dilatation or dissection. 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 Collateral flow from the SMA to the celiac axis branches is noted. SMA: Within normal limits. Renals: Both renal arteries are patent without evidence of aneurysm, dissection, vasculitis, fibromuscular dysplasia or significant stenosis. IMA: Patent without evidence of aneurysm, dissection, vasculitis or significant stenosis. Inflow: Iliacs are within normal limits. Veins: No specific venous abnormality is noted. Review of the MIP images confirms the above findings. NON-VASCULAR Lower chest: Lung bases demonstrate compressive atelectasis in the right lower lobe secondary to large hiatal hernia. Hepatobiliary: Liver shows multiple cystic lesions which are stable in appearance from the prior exam. Gallbladder is well distended with stones within. Common bile duct has increased in size when compared with the prior exam. This measures up to 14 mm. There is a suggestion of a tiny distal stone in the coronal image number 39 of series 7. Correlation with laboratory values is recommended. Pancreas: Unremarkable. No pancreatic ductal dilatation or surrounding inflammatory changes. Spleen: Normal in size without focal abnormality. Adrenals/Urinary Tract: Adrenal glands are within normal limits. Kidneys demonstrate a normal enhancement pattern bilaterally. An exophytic fatty lesion from the inferior aspect of the  right kidney is  again seen and stable measuring up to 4.1 cm. This is consistent with a angiomyolipoma. The bladder is decompressed. Stomach/Bowel: Large hiatal hernia is identified within the right chest. Scattered diverticular change of the colon is seen. No obstructive or inflammatory changes of the colon are noted. A loop of ascending colon extends through a anterior abdominal wall spigelian hernia lateral to the right rectus muscle. This is stable in appearance from the prior exam. No obstructive changes are seen. Small bowel is within normal limits. No pooling of contrast is identified to suggest GI hemorrhage. The appendix is not well visualized. No inflammatory changes to suggest appendicitis are noted. Lymphatic: No lymphadenopathy is noted. Reproductive: Status post hysterectomy. No adnexal masses. Other: No abdominal wall hernia or abnormality. No abdominopelvic ascites. Musculoskeletal: No acute or significant osseous findings. IMPRESSION: VASCULAR No evidence of active GI hemorrhage. 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 Large hiatal hernia. Diverticulosis without diverticulitis. Electronically Signed   By: Violeta Grey M.D.   On: 06/28/2023 20:41   DG Chest Port 1 View Result Date: 06/28/2023 CLINICAL DATA:  Upper abdominal pain, sepsis EXAM: PORTABLE CHEST 1 VIEW COMPARISON:  08/31/2022 FINDINGS: Large density in the right lower chest favoring hiatal hernia and adjacent passive atelectasis. Reverse lordotic projection. Upper normal heart size. The left lung appears clear. Atherosclerotic calcification of the aortic arch. IMPRESSION: 1. Large hiatal hernia with adjacent passive atelectasis in the right lower chest. 2. Upper normal heart size. 3. Aortic Atherosclerosis (ICD10-I70.0). Electronically Signed   By: Freida Jes M.D.   On: 06/28/2023 20:17    Patient Profile: Darlene [name] is a 83 y.o.  female with past medical history of A-fib with RVR on Eliquis, pulmonary hypertension, central sleep apnea on CPAP, morbid obesity, GERD, dysphagia with esophageal strictures requiring dilation, and hiatal hernia, admitted with intractable nausea and vomiting which initially started 2 weeks ago.   Assessment / Plan:  83 year old with a past medical history of GERD, benign esophageal stenosis and a large hiatal hernia admitted 06/29/2023 with N/V, coffee ground/black emesis and central abdominal pain.  Previously scheduled for an outpatient EGD with Dr. Yvone Herd 06/30/2023 due to having GERD, dysphagia and early satiety symptoms. CTA 4/14 was negative for active GI bleed. EGD was scheduled today but was cancelled after she developed acute respiratory failure secondary to flash pulmonary edema in setting of aflutter with RVR.  Hg stable 12.9. -EGD when cardiac and respiratory status stable -Continue Pantoprazole 40 mg IV twice daily -CBC in a.m.  RLQ pain. Prior outpatient CTAP 05/31/2023 showed a moderate right lower Spigelian hernia containing a nondilated loop of ascending colon, without fluid/inflammatory changes.  -Scheduled for hernia repair (hiatal hernia and spigelian hernia) with Dr. Aldon Hung 08/03/2023.   Atrial fibrillation on Eliquis. CHA2DS2-VASc Score = 5. Last dose of Eliquis was on Sunday 06/27/2023. -Continue to hold Eliquis  Acute respiratory failure, fluid overloaded/flash pulmonary edema with aflutter with RVR. Intubated on the vent. Vomited a large volume of brown emesis during intubation with aspiration. S/P bronchoscopy with lavage. On Levophed. Received Lasix IV. Started on Zosyn IV. -Management per CCM   Leukocytosis, WBC 12.2. Afebrile. -CBC in am   History of colon polyps.  Colonoscopy 05/2017 identified 2 sessile serrated/tubular adenomatous polyps removed from colon.   Chronic constipation, intermittently takes multiple laxatives.  Recent loose stools without taking  any laxatives.  No recent  antibiotic use. - Check GI pathogen panel  and C. Diff if patient demonstrates active diarrhea      Principal Problem:   Acute GI bleeding Active Problems:   Intractable nausea and vomiting   Acute hypoxic respiratory failure (HCC)     LOS: 0 days   Arnaldo Natal  06/30/2023, 5:21 PM

## 2023-06-30 NOTE — Care Management Obs Status (Signed)
 MEDICARE OBSERVATION STATUS NOTIFICATION   Patient Details  Name: FOREVER ARECHIGA MRN: [id number] Date of Birth: [birth year]   Medicare Observation Status Notification Given:  Yes  Moon/Obs letter done and copy given  Wynonia Hedges 06/30/2023, 9:09 AM

## 2023-06-30 NOTE — Progress Notes (Signed)
 Transported pt from 2W36 to 2M06 with [facility] RN with no apparent complications.

## 2023-06-30 NOTE — Progress Notes (Signed)
 PROGRESS NOTE    Darlene [name]  GNF:[id number] DOB: September 16, 1940 DOA: 06/29/2023 PCP: Park Meo, FNP  Chief Complaint  Patient presents with   GI Bleeding    Assessment and Plan:  Darlene [name] is an 83 year old female with atrial fibrillation on Eliquis, central sleep apnea, hypertension, hyperlipidemia, GERD, depression, anxiety, chronic lumbar back pain, who presented to the ED with a ground emesis.  She was seen in [facility] the ED and advised to follow-up with GI outpatient for endoscopy however outpatient GI center reported that endoscopic evaluation for GI bleed would need to occur inpatient.  She then presented to [facility], ED.  Hemoglobin unremarkable at 13.6.  All other labs unremarkable.  CT angio abdomen pelvis did not reveal any active GI hemorrhage.  It did reveal high-grade stenosis involving the celiac axis with collateral flow from the SMA as well as a large hiatal hernia and diverticulosis without diverticulitis.  Vitals reveal tachycardia and tachypnea.  EKG reveals atrial fibrillation with incomplete RBBB.  Patient was given Reglan, Zofran, IV Protonix.  GI was consulted.  Patient had plans to go for EGD on 4/16. She is already scheduled for hiatal hernia and spigelian hernia repair with Dr. Doylene Canard in May 2025.  In the morning on 4/16 patient was found to be in A-fib RVR, rate into the 140s and 150s.  I resumed her home dose diltiazem with minimal response.  RRT called. She received 5 mg IV push metoprolol, rate was persistently elevated.  Patient also became acutely hypoxic during this time.  Stat chest x-ray was ordered which reveals known large hiatal hernia as well as opacities in the left lung base.  On physical exam patient has diffuse crackles bilaterally.  Given her persistent A-fib there is concern for pulmonary edema, though cannot rule out an aspiration event.  She received 40 mg IV Lasix push x 2.  Cardiology was consulted.  She has been initiated on diltiazem  drip.  ABG was performed which revealed  7.2/69/52/31.  Patient was initiated on BiPAP.  She had some improvement in her sats but was not consistently above 92%.  PCCM was consulted who agreed to take the patient to ICU for closer monitoring and possible intubation.  Care plan was discussed extensively with PCCM physician, cardiologist, and patient's family at bedside.  Acute hypoxic and hypercapnic respiratory failure  -- likely secondary to pulmonary edema.  Cannot rule out large aspiration event. - Flu/COVID/RSV also ordered. - Now on BiPAP - Status post 80 mg IV Lasix, strict I's and O's - Transferring to ICU  Persistent atrial fibrillation, RVR - On diltiazem and metoprolol at home.  -- s/p 5mg  IVP Metop - Transferring to ICU.  Dilt drip ordered. - Status post 40 mg IV Lasix x2-echocardiogram ordered when stabilized  [place] emesis - I discussed directly with gastroenterology.  Will hold off on EGD for now until patient's respiratory status stabilizes.  OSA - CPAP at night  Objective: Vitals:   06/30/23 0021 06/30/23 0431 06/30/23 0807 06/30/23 1120  BP: (!) 145/112 (!) 156/78 (!) 129/90   Pulse: (!) 102 (!) 108 (!) 105   Resp: 20 20 18    Temp: 98.2 F (36.8 C) 97.9 F (36.6 C)    TempSrc: Oral Oral    SpO2: 93% 93% (!) 72% (!) 82%  Weight:      Height:        Intake/Output Summary (Last 24 hours) at 06/30/2023 1139 Last data filed at 06/29/2023 1441  Gross per 24 hour  Intake 1000 ml  Output --  Net 1000 ml   Filed Weights   06/29/23 1202  Weight: 103.7 kg    Examination: General exam: Fatigued, work of breathing evident. Respiratory system: Follow respirations, diffuse crackles bilaterally.  On BiPAP.  Cardiovascular system: Rapid irregular rate Gastrointestinal system: Abdomen is nondistended, soft and nontender.  Neuro: Questions appropriately, oriented.   DVT prophylaxis: Home dose Eliquis held for GIB   Code Status: Full Code Family  Communication:  Discussed directly with patient and daughter at bedside  Consultants:  Treatment Team:  Consulting Physician: Imogene Burn, MD Cardiology: Dr. Rosemary Holms PCCM   Antimicrobials:  Anti-infectives (From admission, onward)    None       Data Reviewed: I have personally reviewed following labs and imaging studies CBC: Recent Labs  Lab 06/28/23 1806 06/29/23 1209 06/29/23 2132 06/30/23 0034 06/30/23 0632  WBC 8.8 12.2* 10.3 9.9 9.2  NEUTROABS 6.4  --   --   --   --   HGB 13.6 13.9 12.8 12.7 13.2  HCT 40.0 42.0 37.6 37.6 39.3  MCV 94.6 97.0 96.7 95.2 96.6  PLT 225 224 186 201 200   Basic Metabolic Panel: Recent Labs  Lab 06/28/23 1806 06/29/23 1209 06/30/23 0632  NA 140 142 140  K 3.4* 3.5 3.0*  CL 101 103 102  CO2 28 27 26   GLUCOSE 126* 119* 126*  BUN 17 17 19   CREATININE 0.93 1.03* 0.83  CALCIUM 9.3 9.5 8.8*   GFR: Estimated Creatinine Clearance: 67 mL/min (by C-G formula based on SCr of 0.83 mg/dL). Liver Function Tests: Recent Labs  Lab 06/28/23 1806 06/29/23 1209  AST 22 19  ALT 20 20  ALKPHOS 50 44  BILITOT 1.0 1.2  PROT 6.1* 6.2*  ALBUMIN 3.6 3.7   CBG: No results for input(s): "GLUCAP" in the last 168 hours.  Recent Results (from the past 240 hours)  Culture, blood (Routine x 2)     Status: None (Preliminary result)   Collection Time: 06/28/23  6:06 PM   Specimen: BLOOD  Result Value Ref Range Status   Specimen Description BLOOD BLOOD RIGHT HAND  Final   Special Requests   Final    BOTTLES DRAWN AEROBIC AND ANAEROBIC Blood Culture results may not be optimal due to an inadequate volume of blood received in culture bottles   Culture   Final    NO GROWTH 2 DAYS Performed at [facility], [address] [place], [place] [address]    Report Status PENDING  Incomplete  Culture, blood (Routine x 2)     Status: None (Preliminary result)   Collection Time: 06/28/23  6:33 PM   Specimen: BLOOD  Result Value Ref Range Status    Specimen Description BLOOD BLOOD LEFT HAND  Final   Special Requests   Final    BOTTLES DRAWN AEROBIC AND ANAEROBIC Blood Culture results may not be optimal due to an inadequate volume of blood received in culture bottles   Culture   Final    NO GROWTH 2 DAYS Performed at [facility], [address] [place], [place] [address]    Report Status PENDING  Incomplete     Radiology Studies: CT ANGIO GI BLEED Result Date: 06/28/2023 CLINICAL DATA:  Upper abdominal pain, evaluate for GI bleeding EXAM: CTA ABDOMEN AND PELVIS WITHOUT AND WITH CONTRAST TECHNIQUE: Multidetector CT imaging of the abdomen and pelvis was performed using the standard protocol during bolus administration of intravenous  contrast. Multiplanar reconstructed images and MIPs were obtained and reviewed to evaluate the vascular anatomy. RADIATION DOSE REDUCTION: This exam was performed according to the departmental dose-optimization program which includes automated exposure control, adjustment of the mA and/or kV according to patient size and/or use of iterative reconstruction technique. CONTRAST:  OMNIPAQUE IOHEXOL 350 MG/ML SOLN COMPARISON:  05/31/2023 FINDINGS: VASCULAR Aorta: Atherosclerotic calcifications of the aorta are noted without aneurysmal dilatation or dissection. 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 Collateral flow from the SMA to the celiac axis branches is noted. SMA: Within normal limits. Renals: Both renal arteries are patent without evidence of aneurysm, dissection, vasculitis, fibromuscular dysplasia or significant stenosis. IMA: Patent without evidence of aneurysm, dissection, vasculitis or significant stenosis. Inflow: Iliacs are within normal limits. Veins: No specific venous abnormality is noted. Review of the MIP images confirms the above findings. NON-VASCULAR Lower chest: Lung bases demonstrate compressive atelectasis in the right lower lobe secondary to large hiatal hernia.  Hepatobiliary: Liver shows multiple cystic lesions which are stable in appearance from the prior exam. Gallbladder is well distended with stones within. Common bile duct has increased in size when compared with the prior exam. This measures up to 14 mm. There is a suggestion of a tiny distal stone in the coronal image number 39 of series 7. Correlation with laboratory values is recommended. Pancreas: Unremarkable. No pancreatic ductal dilatation or surrounding inflammatory changes. Spleen: Normal in size without focal abnormality. Adrenals/Urinary Tract: Adrenal glands are within normal limits. Kidneys demonstrate a normal enhancement pattern bilaterally. An exophytic fatty lesion from the inferior aspect of the right kidney is again seen and stable measuring up to 4.1 cm. This is consistent with a angiomyolipoma. The bladder is decompressed. Stomach/Bowel: Large hiatal hernia is identified within the right chest. Scattered diverticular change of the colon is seen. No obstructive or inflammatory changes of the colon are noted. A loop of ascending colon extends through a anterior abdominal wall spigelian hernia lateral to the right rectus muscle. This is stable in appearance from the prior exam. No obstructive changes are seen. Small bowel is within normal limits. No pooling of contrast is identified to suggest GI hemorrhage. The appendix is not well visualized. No inflammatory changes to suggest appendicitis are noted. Lymphatic: No lymphadenopathy is noted. Reproductive: Status post hysterectomy. No adnexal masses. Other: No abdominal wall hernia or abnormality. No abdominopelvic ascites. Musculoskeletal: No acute or significant osseous findings. IMPRESSION: VASCULAR No evidence of active GI hemorrhage. 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 Large hiatal hernia. Diverticulosis without diverticulitis.  Electronically Signed   By: Violeta Grey M.D.   On: 06/28/2023 20:41   DG Chest Port 1 View Result Date: 06/28/2023 CLINICAL DATA:  Upper abdominal pain, sepsis EXAM: PORTABLE CHEST 1 VIEW COMPARISON:  08/31/2022 FINDINGS: Large density in the right lower chest favoring hiatal hernia and adjacent passive atelectasis. Reverse lordotic projection. Upper normal heart size. The left lung appears clear. Atherosclerotic calcification of the aortic arch. IMPRESSION: 1. Large hiatal hernia with adjacent passive atelectasis in the right lower chest. 2. Upper normal heart size. 3. Aortic Atherosclerosis (ICD10-I70.0). Electronically Signed   By: Freida Jes M.D.   On: 06/28/2023 20:17    Scheduled Meds:  diltiazem  300 mg Oral Daily   escitalopram  20 mg Oral Daily   furosemide  40 mg Intravenous Q12H   metoprolol tartrate  50 mg Oral Daily  And   metoprolol tartrate  25 mg Oral QHS   pantoprazole (PROTONIX) IV  40 mg Intravenous Q12H   Continuous Infusions:  potassium chloride 10 mEq (06/30/23 1040)     LOS: 0 days  CRITICAL CARE Performed by: Cathalina Barcia  Total critical care time: 61 minutes Critical care time was exclusive of separately billable procedures and treating other patients. Critical care was necessary to treat or prevent imminent or life-threatening deterioration. Critical care was time spent personally by me on the following activities: development of treatment plan with patient and/or surrogate as well as nursing, discussions with consultants, evaluation of patient's response to treatment, examination of patient, obtaining history from patient or surrogate, ordering and performing treatments and interventions, ordering and review of laboratory studies, ordering and review of radiographic studies, pulse oximetry and re-evaluation of patient's condition.    Jasyah Theurer, DO [facility]  To contact the attending physician between 7A-7P please use Epic Chat.  To contact the covering physician during after hours 7P-7A, please review Amion.   06/30/2023, 11:39 AM   *This document has been created with the assistance of dictation software. Please excuse typographical errors. *

## 2023-06-30 NOTE — Procedures (Signed)
 Central Venous Catheter Insertion Procedure Note  JOHNAY MANO  [id number]  21-Oct-1940  Date:06/30/23  Time:2:21 PM   Provider Performing:Priyanka Causey R Sharin Altidor   Procedure: Insertion of Non-tunneled Central Venous Catheter(36556) with US  guidance (29528)   Indication(s) Medication administration  Consent Unable to obtain consent due to emergent nature of procedure.  Anesthesia Topical only with 1% lidocaine   Timeout Verified patient identification, verified procedure, site/side was marked, verified correct patient position, special equipment/implants available, medications/allergies/relevant history reviewed, required imaging and test results available.  Sterile Technique Maximal sterile technique including full sterile barrier drape, hand hygiene, sterile gown, sterile gloves, mask, hair covering, sterile ultrasound probe cover (if used).  Procedure Description Area of catheter insertion was cleaned with chlorhexidine and draped in sterile fashion.  With real-time ultrasound guidance a central venous catheter was placed into the left internal jugular vein. Nonpulsatile blood flow and easy flushing noted in all ports.  The catheter was sutured in place and sterile dressing applied.  Complications/Tolerance None; patient tolerated the procedure well. Chest X-ray is ordered to verify placement for internal jugular or subclavian cannulation.   Chest x-ray is not ordered for femoral cannulation.  EBL Minimal  Specimen(s) None   [name] Johanny Segers, PA-C

## 2023-06-30 NOTE — Progress Notes (Signed)
 PT transported to CT and back to 3M06 without any complications.

## 2023-06-30 NOTE — Consult Note (Signed)
 NAME:  Darlene [name], MRN:  [id number], DOB:  03/30/1940, LOS: 0 ADMISSION DATE:  06/29/2023, CONSULTATION DATE:  06/30/23 REFERRING MD:  TRH, CHIEF COMPLAINT:  afib/flutter RVR   History of Present Illness:  Darlene [name] is a 83 y.o. F with PMH significant for atrial fibrillation on eliquis, OSA on CPAP, HTN, HL, GERD and esophageal stricwho presented to the ED 4/15 with coffee ground emesis.  This initially began about one week before discharge and she was supposed to follow up for a scope outpatient for this, but was told this would need to be done inpatient.  CTA abd/pelvis did not reveal any acute source of bleeding, did show high-grade stenosis involving celiac access with collateral flow from the SMA.  Patient is already scheduled for hiatal hernia and spigelian hernia repair with Dr. Doylene Canard in 07/2023.  Pt has not required any transfusions   She was admitted and the plan was to head to EGD and then be discharged, however she has been NPO and her rate controlling agents were held and pt developed Afib with RVR and hypoxia/hypercarbia.  She was placed on bipap and given her recent emesis PCCM was consulted.  Family agreed to intubation and pt started on a cardizem gtt   Pertinent  Medical History   has a past medical history of Anxiety, Chronic lower back pain, Depression, DJD (degenerative joint disease), GERD (gastroesophageal reflux disease), and Hypertension.   Significant Hospital Events: Including procedures, antibiotic start and stop dates in addition to other pertinent events   4/15 admit with coffee ground emesis for inpatient EGD 4/16 afib with RVR, concern for pulm edema, PCCM consult and ICU txfr  Interim History / Subjective:  Pt appears ill and fatigued, but is still answering questions and responsive  Received Lasix 80mg  IV, Metop 5mg  and placed on Dilt gtt until became hypotensive during transport   Objective   Blood pressure 102/69, pulse (!) 141, temperature 97.9 F  (36.6 C), temperature source Oral, resp. rate (!) 30, height 5\' 10"  (1.778 m), weight 103.7 kg, SpO2 93%.        Intake/Output Summary (Last 24 hours) at 06/30/2023 1300 Last data filed at 06/29/2023 1441 Gross per 24 hour  Intake 1000 ml  Output --  Net 1000 ml   Filed Weights   06/29/23 1202  Weight: 103.7 kg   General:  well nourished, elderly F, critically ill-appearing on Bipap HEENT: MM pink/moist,  Neuro: appears weak and fatigued, does wake up and give one work answers to questions appropriately, no focal deficits  CV: s1s2 tachycardic, irregular , no m/r/g PULM:  examined on Bipap, no rhonchi or wheezing,  mild dyspnea without accessory muscle use or tachypnea GI: soft, non-distended and non-tender  Extremities: warm/dry, 1+ LE edema  Skin: no rashes or lesions  Resolved Hospital Problem list    Assessment & Plan:    Atrial Fibrillation with RVR with hypotension, acute hypoxic and hypercarbic respiratory failure Suspect secondary to not receiving home medications this morning due to NPO status -transfer to ICU, cardiology evaluated  -plan to intubate as she is not a good candidate for Bipap in the setting of emesis -received Metoprolol 5mg  -cardizem gtt if BP can sustain -replete K, check mag -repeat labs pending  -check lactic, BNP, troponin -may need echo    UGIB with coffee ground emesis in the setting of hiatal hernia and esophageal strictures -EGD planned for later today, held in the setting of hemodynamic instability -monitor Hgb and transfuse  prn          Best Practice (right click and "Reselect all SmartList Selections" daily)   Diet/type: NPO DVT prophylaxis SCD Pressure ulcer(s): N/A GI prophylaxis: PPI Lines: Central line Foley:  Yes, and it is still needed Code Status:  full code Last date of multidisciplinary goals of care discussion [daughter updated at the bedside and confirms full code]  Labs   CBC: Recent Labs  Lab  06/28/23 1806 06/29/23 1209 06/29/23 2132 06/30/23 0034 06/30/23 0632 06/30/23 1227  WBC 8.8 12.2* 10.3 9.9 9.2  --   NEUTROABS 6.4  --   --   --   --   --   HGB 13.6 13.9 12.8 12.7 13.2 14.6  HCT 40.0 42.0 37.6 37.6 39.3 43.0  MCV 94.6 97.0 96.7 95.2 96.6  --   PLT 225 224 186 201 200  --     Basic Metabolic Panel: Recent Labs  Lab 06/28/23 1806 06/29/23 1209 06/30/23 0632 06/30/23 1227  NA 140 142 140 142  K 3.4* 3.5 3.0* 3.3*  CL 101 103 102  --   CO2 28 27 26   --   GLUCOSE 126* 119* 126*  --   BUN 17 17 19   --   CREATININE 0.93 1.03* 0.83  --   CALCIUM 9.3 9.5 8.8*  --    GFR: Estimated Creatinine Clearance: 67 mL/min (by C-G formula based on SCr of 0.83 mg/dL). Recent Labs  Lab 06/28/23 1721 06/28/23 1806 06/28/23 1944 06/29/23 1209 06/29/23 2132 06/30/23 0034 06/30/23 0632  WBC  --    < >  --  12.2* 10.3 9.9 9.2  LATICACIDVEN 1.5  --  1.5  --   --   --   --    < > = values in this interval not displayed.    Liver Function Tests: Recent Labs  Lab 06/28/23 1806 06/29/23 1209  AST 22 19  ALT 20 20  ALKPHOS 50 44  BILITOT 1.0 1.2  PROT 6.1* 6.2*  ALBUMIN 3.6 3.7   Recent Labs  Lab 06/28/23 1806  LIPASE 24   No results for input(s): "AMMONIA" in the last 168 hours.  ABG    Component Value Date/Time   PHART 7.263 (L) 06/30/2023 1227   PCO2ART 69.2 (HH) 06/30/2023 1227   PO2ART 52 (L) 06/30/2023 1227   HCO3 31.4 (H) 06/30/2023 1227   TCO2 34 (H) 06/30/2023 1227   O2SAT 81 06/30/2023 1227     Coagulation Profile: Recent Labs  Lab 06/28/23 1806 06/30/23 0632  INR 1.4* 1.3*    Cardiac Enzymes: No results for input(s): "CKTOTAL", "CKMB", "CKMBINDEX", "TROPONINI" in the last 168 hours.  HbA1C: Hgb A1c MFr Bld  Date/Time Value Ref Range Status  02/16/2023 10:45 AM 5.6 <5.7 % of total Hgb Final    Comment:    For the purpose of screening for the presence of diabetes: . <5.7%       Consistent with the absence of diabetes 5.7-6.4%     Consistent with increased risk for diabetes             (prediabetes) > or =6.5%  Consistent with diabetes . This assay result is consistent with a decreased risk of diabetes. . Currently, no consensus exists regarding use of hemoglobin A1c for diagnosis of diabetes in children. . According to American Diabetes Association (ADA) guidelines, hemoglobin A1c <7.0% represents optimal control in non-pregnant diabetic patients. Different metrics may apply to specific patient populations.  Standards of Medical  Care in Diabetes(ADA). Aaron Aas   12/01/2022 12:23 PM 5.8 (H) <5.7 % of total Hgb Final    Comment:    For someone without known diabetes, a hemoglobin  A1c value between 5.7% and 6.4% is consistent with prediabetes and should be confirmed with a  follow-up test. . For someone with known diabetes, a value <7% indicates that their diabetes is well controlled. A1c targets should be individualized based on duration of diabetes, age, comorbid conditions, and other considerations. . This assay result is consistent with an increased risk of diabetes. . Currently, no consensus exists regarding use of hemoglobin A1c for diagnosis of diabetes for children. .     CBG: No results for input(s): "GLUCAP" in the last 168 hours.  Review of Systems:   Please see the history of present illness. All other systems reviewed and are negative    Past Medical History:  She,  has a past medical history of Anxiety, Chronic lower back pain, Depression, DJD (degenerative joint disease), GERD (gastroesophageal reflux disease), and Hypertension.   Surgical History:   Past Surgical History:  Procedure Laterality Date   ABDOMINAL HYSTERECTOMY  03/1979   BREAST BIOPSY Left 1986 and 1987   CATARACT EXTRACTION, BILATERAL Bilateral 2014   Dr. Michaeleen Adler, in [place]   EYE SURGERY  2024   Dr [facility] in [facility]     Social History:   reports that she has never smoked. She has never  used smokeless tobacco. She reports current alcohol use of about 3.0 standard drinks of alcohol per week. She reports that she does not use drugs.   Family History:  Her family history includes Dementia in her brother; Heart disease in her father and mother; Hypertension in her daughter, father, and mother; Tremor in her sister. There is no history of Colon cancer, Esophageal cancer, or Liver disease.   Allergies Allergies  Allergen Reactions   Cephalosporins Rash   Keflex [Cephalexin] Rash     Home Medications  Prior to Admission medications   Medication Sig Start Date End Date Taking? Authorizing Provider  acetaminophen (TYLENOL) 500 MG tablet Take 500 mg by mouth every 6 (six) hours as needed for mild pain (pain score 1-3).   Yes [provider]  albuterol (VENTOLIN HFA) 108 (90 Base) MCG/ACT inhaler Inhale 2 puffs into the lungs every 6 (six) hours as needed for wheezing or shortness of breath. 05/22/22  Yes Cranford, Tonya, NP  apixaban (ELIQUIS) 5 MG TABS tablet TAKE 1 TABLET BY MOUTH TWICE  DAILY 06/07/23  Yes Branch, Joyceann No, MD  Ascorbic Acid (VITAMIN C) 500 MG CAPS Take 500 mg by mouth.    Yes [provider]  busPIRone (BUSPAR) 10 MG tablet TAKE 1/2 TO 1 TABLET BY MOUTH 3  TIMES DAILY AS NEEDED FOR  ANXIETY, MOOD OR IRRITABILITY 10/22/22  Yes Wilkinson, Dana E, FNP  CHOLECALCIFEROL PO Take 5,000 Units by mouth daily.   Yes [provider]  Cyanocobalamin 2500 MCG TABS Take 2,500 mcg by mouth once a week.   Yes [provider]  cyclobenzaprine (FLEXERIL) 10 MG tablet Take 1/2 to 1 tablet    2 to 3 x /day if needed for Muscle Spasms Patient taking differently: Take 5-10 mg by mouth 3 (three) times daily as needed for muscle spasms. Take 1/2 to 1 tablet    2 to 3 x /day if needed for Muscle Spasms 04/25/19  Yes Vangie Genet, MD  diltiazem (CARDIZEM CD) 300 MG 24  hr capsule TAKE 1 CAPSULE BY MOUTH DAILY 10/22/22  Yes Branch, Joyceann No, MD  docusate  sodium (COLACE) 100 MG capsule Take 100 mg by mouth daily as needed for mild constipation.   Yes [provider]  escitalopram (LEXAPRO) 20 MG tablet TAKE 1 TABLET BY MOUTH DAILY FOR MOOD, ANXIETY &amp; IRRITABILITY 10/10/22  Yes Wilkinson, Dana E, FNP  famotidine (PEPCID) 40 MG tablet Take 1 tablet (40 mg total) by mouth at bedtime. 06/28/23  Yes Dorrell, Porfirio Bristol, MD  ferrous sulfate 325 (65 FE) MG tablet Take 1 tablet (325 mg total) by mouth every other day. 01/15/20  Yes Stella Bureau, NP  Magnesium 250 MG TABS Take 1 tablet by mouth daily.   Yes [provider]  metoprolol tartrate (LOPRESSOR) 25 MG tablet TAKE 2 TABLETS BY MOUTH IN THE  MORNING AND 1 TABLET BY MOUTH IN THE EVENING Patient taking differently: Take 25-50 mg by mouth 2 (two) times daily. TAKE 2 TABLETS BY MOUTH IN THE  MORNING AND 1 TABLET BY MOUTH IN THE EVENING 05/05/23  Yes Branch, Joyceann No, MD  nitroGLYCERIN (NITROSTAT) 0.4 MG SL tablet Place 1 tablet (0.4 mg total) under the tongue every 5 (five) minutes as needed for chest pain. 02/05/20  Yes Strader, [place] M, PA-C  pantoprazole (PROTONIX) 40 MG tablet Take 1 tablet (40 mg total) by mouth 2 (two) times daily. Take  1 tablet  Daily  to prevent  Indigestion & Acid Reflux 06/28/23  Yes Dorrell, Porfirio Bristol, MD  Probiotic Product (PROBIOTIC DAILY PO) Take 1 tablet by mouth daily.   Yes [provider]  sucralfate (CARAFATE) 1 g tablet Take 2 tablets (2 g total) by mouth 4 (four) times daily -  with meals and at bedtime. 06/28/23  Yes Dorrell, Porfirio Bristol, MD  zinc gluconate 50 MG tablet Take 50 mg by mouth daily.   Yes [provider]     Critical care time:  45 minutes       CRITICAL CARE Performed by: [name] Samone Guhl   Total critical care time: 45 minutes  Critical care time was exclusive of separately billable procedures and treating other patients.  Critical care was necessary to treat or prevent imminent or life-threatening  deterioration.  Critical care was time spent personally by me on the following activities: development of treatment plan with patient and/or surrogate as well as nursing, discussions with consultants, evaluation of patient's response to treatment, examination of patient, obtaining history from patient or surrogate, ordering and performing treatments and interventions, ordering and review of laboratory studies, ordering and review of radiographic studies, pulse oximetry and re-evaluation of patient's condition.  [name] Nedra Mcinnis, PA-C  Pulmonary & Critical care See Amion for pager If no response to pager , please call [phone] until 7pm After 7:00 pm call Elink  [phone]

## 2023-06-30 NOTE — Progress Notes (Signed)
 Interval progress note:  Pt's norepi requirement has decreased to , replacing K and Mag, lactic 2.9, Echo with preserved EF but positive McConnell's sign, sent for CTA chest and given 500cc LR bolus.  CTA reviewed with Dr. Diania Fortes, no obvious large PE, dense infiltrates.  Continue Zosyn.    [name] Nicolet Griffy, PA-C

## 2023-06-30 NOTE — Progress Notes (Signed)
 [facility] Progress Note  CC: N/V, hematemesis, abdominal pain    Subjective: She is unresponsive, sedated on the vent.  Family at the bedside.  RN reported patient vomited up a large volume of brown emesis during intubation.  Objective:  Vital signs in last 24 hours: Temp:  [96.9 F (36.1 C)-98.7 F (37.1 C)] 96.9 F (36.1 C) (04/16 1550) Pulse Rate:  [54-141] 97 (04/16 1550) Resp:  [18-30] 24 (04/16 1550) BP: (62-158)/(39-112) 90/67 (04/16 1550) SpO2:  [72 %-98 %] 97 % (04/16 1550) FiO2 (%):  [100 %] 100 % (04/16 1550) Weight:  [95.7 kg] 95.7 kg (04/16 1316)   General: Critically ill appearing 83 year old female intubated, sedated.  Heart: Irregular rhythm Pulm: Breath sounds clear, slightly coarse on the vent. Abdomen: Soft, nondistended.  Hypoactive bowel sounds all 4 quadrants.  No obvious tenderness/rebound or guarding. Extremities:  No edema. Neurologic: Sedated on the vent, unresponsive.  Does not follow commands.  Intake/Output from previous day: 04/15 0701 - 04/16 0700 In: 1000 [IV Piggyback:1000] Out: -  Intake/Output this shift: No intake/output data recorded.  Lab Results: Recent Labs    06/30/23 0034 06/30/23 0632 06/30/23 1227 06/30/23 1549 06/30/23 1617  WBC 9.9 9.2  --   --  7.4  HGB 12.7 13.2 14.6 12.2 12.9  HCT 37.6 39.3 43.0 36.0 38.8  PLT 201 200  --   --  207   BMET Recent Labs    06/28/23 1806 06/29/23 1209 06/30/23 0632 06/30/23 1227 06/30/23 1549  NA 140 142 140 142 142  K 3.4* 3.5 3.0* 3.3* 3.2*  CL 101 103 102  --   --   CO2 28 27 26   --   --   GLUCOSE 126* 119* 126*  --   --   BUN 17 17 19   --   --   CREATININE 0.93 1.03* 0.83  --   --   CALCIUM 9.3 9.5 8.8*  --   --    LFT Recent Labs    06/29/23 1209  PROT 6.2*  ALBUMIN 3.7  AST 19  ALT 20  ALKPHOS 44  BILITOT 1.2   PT/INR Recent Labs    06/28/23 1806 06/30/23 0632  LABPROT 16.9* 15.9*  INR 1.4* 1.3*   Hepatitis Panel No results for  input(s): "HEPBSAG", "HCVAB", "HEPAIGM", "HEPBIGM" in the last 72 hours.  ECHOCARDIOGRAM COMPLETE Result Date: 06/30/2023    ECHOCARDIOGRAM REPORT   Patient Name:   Darlene [name] Date of Exam: 06/30/2023 Medical Rec #:  [id number]      Height:       70.0 in Accession #:    [id number]     Weight:       211.0 lb Date of Birth:  25-Nov-1940     BSA:          2.135 m Patient Age:    82 years       BP:           103/64 mmHg Patient Gender: F              HR:           90 bpm. Exam Location:  Inpatient Procedure: 2D Echo, Color Doppler and Cardiac Doppler (Both Spectral and Color            Flow Doppler were utilized during procedure). STAT ECHO Indications:    Shock R57.9  History:        Patient has prior history of  Echocardiogram examinations, most                 recent 04/16/2019. Pulmonary HTN, Arrythmias:Atrial Fibrillation;                 Risk Factors:Hypertension and Dyslipidemia.  Sonographer:    Irving Burton Senior RDCS Referring Phys: [id number] Lorin Glass  Sonographer Comments: Scanned post emergent intubation IMPRESSIONS  1. Left ventricular ejection fraction, by estimation, is 50 to 55%. The left ventricle has low normal function. The left ventricle demonstrates global hypokinesis. There is mild concentric left ventricular hypertrophy. Left ventricular diastolic function could not be evaluated.  2. Akiniesis of the RV free wall with tethering of the apex consistent with McConnell's sign and acute RV dsyfunction. Right ventricular systolic function is mildly reduced. The right ventricular size is mildly enlarged. There is moderately elevated pulmonary artery systolic pressure.  3. Left atrial size was moderately dilated.  4. A small pericardial effusion is present.  5. The mitral valve is normal in structure. Trivial mitral valve regurgitation.  6. Tricuspid valve regurgitation is moderate.  7. The aortic valve is tricuspid. Aortic valve regurgitation is trivial. Aortic valve sclerosis is present, with no  evidence of aortic valve stenosis.  8. The inferior vena cava is dilated in size with <50% respiratory variability, suggesting right atrial pressure of 15 mmHg. FINDINGS  Left Ventricle: Left ventricular ejection fraction, by estimation, is 50 to 55%. The left ventricle has low normal function. The left ventricle demonstrates global hypokinesis. The left ventricular internal cavity size was normal in size. There is mild concentric left ventricular hypertrophy. Left ventricular diastolic function could not be evaluated due to atrial fibrillation. Left ventricular diastolic function could not be evaluated. Right Ventricle: Akiniesis of the RV free wall with tethering of the apex consistent with McConnell's sign and acute RV dsyfunction. The right ventricular size is mildly enlarged. No increase in right ventricular wall thickness. Right ventricular systolic function is mildly reduced. There is moderately elevated pulmonary artery systolic pressure. The tricuspid regurgitant velocity is 2.81 m/s, and with an assumed right atrial pressure of 15 mmHg, the estimated right ventricular systolic pressure is 46.6 mmHg. Left Atrium: Left atrial size was moderately dilated. Right Atrium: Right atrial size was normal in size. Pericardium: A small pericardial effusion is present. Mitral Valve: The mitral valve is normal in structure. Trivial mitral valve regurgitation. Tricuspid Valve: The tricuspid valve is normal in structure. Tricuspid valve regurgitation is moderate. Aortic Valve: The aortic valve is tricuspid. Aortic valve regurgitation is trivial. Aortic valve sclerosis is present, with no evidence of aortic valve stenosis. Pulmonic Valve: The pulmonic valve was normal in structure. Pulmonic valve regurgitation is mild. Aorta: The aortic root and ascending aorta are structurally normal, with no evidence of dilitation. Venous: The inferior vena cava is dilated in size with less than 50% respiratory variability, suggesting  right atrial pressure of 15 mmHg. IAS/Shunts: No atrial level shunt detected by color flow Doppler.  LEFT VENTRICLE PLAX 2D LVIDd:         3.60 cm LVIDs:         2.70 cm LV PW:         1.30 cm LV IVS:        1.00 cm LVOT diam:     1.90 cm LV SV:         34 LV SV Index:   16 LVOT Area:     2.84 cm  RIGHT VENTRICLE RV S prime:  7.72 cm/s TAPSE (M-mode): 1.6 cm LEFT ATRIUM              Index        RIGHT ATRIUM           Index LA diam:        4.50 cm  2.11 cm/m   RA Area:     19.40 cm LA Vol (A2C):   64.3 ml  30.11 ml/m  RA Volume:   52.40 ml  24.54 ml/m LA Vol (A4C):   110.0 ml 51.52 ml/m LA Biplane Vol: 85.0 ml  39.81 ml/m  AORTIC VALVE LVOT Vmax:   78.25 cm/s LVOT Vmean:  52.000 cm/s LVOT VTI:    0.120 m  AORTA Ao Root diam: 2.50 cm Ao Asc diam:  2.80 cm TRICUSPID VALVE TR Peak grad:   31.6 mmHg TR Vmax:        281.00 cm/s  SHUNTS Systemic VTI:  0.12 m Systemic Diam: 1.90 cm Arta Lark Electronically signed by Arta Lark Signature Date/Time: 06/30/2023/3:04:09 PM    Final    DG Chest 1 View Result Date: 06/30/2023 CLINICAL DATA:  Hypoxia. EXAM: CHEST  1 VIEW COMPARISON:  06/28/2023. FINDINGS: Stable cardiomediastinal silhouette. Redemonstrated large hiatal hernia at the right base with adjacent atelectasis. Streaky opacities at the left lung base. No pneumothorax. Visualized osseous structures are unchanged. IMPRESSION: 1. Similar large hiatal hernia with adjacent atelectasis in the right lower lung. 2. Streaky opacities at the left lung base could reflect atelectasis or infiltrate. Electronically Signed   By: Mannie Seek M.D.   On: 06/30/2023 12:57   CT ANGIO GI BLEED Result Date: 06/28/2023 CLINICAL DATA:  Upper abdominal pain, evaluate for GI bleeding EXAM: CTA ABDOMEN AND PELVIS WITHOUT AND WITH CONTRAST TECHNIQUE: Multidetector CT imaging of the abdomen and pelvis was performed using the standard protocol during bolus administration of intravenous contrast. Multiplanar  reconstructed images and MIPs were obtained and reviewed to evaluate the vascular anatomy. RADIATION DOSE REDUCTION: This exam was performed according to the departmental dose-optimization program which includes automated exposure control, adjustment of the mA and/or kV according to patient size and/or use of iterative reconstruction technique. CONTRAST:  OMNIPAQUE IOHEXOL 350 MG/ML SOLN COMPARISON:  05/31/2023 FINDINGS: VASCULAR Aorta: Atherosclerotic calcifications of the aorta are noted without aneurysmal dilatation or dissection. 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 Collateral flow from the SMA to the celiac axis branches is noted. SMA: Within normal limits. Renals: Both renal arteries are patent without evidence of aneurysm, dissection, vasculitis, fibromuscular dysplasia or significant stenosis. IMA: Patent without evidence of aneurysm, dissection, vasculitis or significant stenosis. Inflow: Iliacs are within normal limits. Veins: No specific venous abnormality is noted. Review of the MIP images confirms the above findings. NON-VASCULAR Lower chest: Lung bases demonstrate compressive atelectasis in the right lower lobe secondary to large hiatal hernia. Hepatobiliary: Liver shows multiple cystic lesions which are stable in appearance from the prior exam. Gallbladder is well distended with stones within. Common bile duct has increased in size when compared with the prior exam. This measures up to 14 mm. There is a suggestion of a tiny distal stone in the coronal image number 39 of series 7. Correlation with laboratory values is recommended. Pancreas: Unremarkable. No pancreatic ductal dilatation or surrounding inflammatory changes. Spleen: Normal in size without focal abnormality. Adrenals/Urinary Tract: Adrenal glands are within normal limits. Kidneys demonstrate a normal enhancement pattern bilaterally. An exophytic fatty lesion from the inferior aspect of the  right kidney is  again seen and stable measuring up to 4.1 cm. This is consistent with a angiomyolipoma. The bladder is decompressed. Stomach/Bowel: Large hiatal hernia is identified within the right chest. Scattered diverticular change of the colon is seen. No obstructive or inflammatory changes of the colon are noted. A loop of ascending colon extends through a anterior abdominal wall spigelian hernia lateral to the right rectus muscle. This is stable in appearance from the prior exam. No obstructive changes are seen. Small bowel is within normal limits. No pooling of contrast is identified to suggest GI hemorrhage. The appendix is not well visualized. No inflammatory changes to suggest appendicitis are noted. Lymphatic: No lymphadenopathy is noted. Reproductive: Status post hysterectomy. No adnexal masses. Other: No abdominal wall hernia or abnormality. No abdominopelvic ascites. Musculoskeletal: No acute or significant osseous findings. IMPRESSION: VASCULAR No evidence of active GI hemorrhage. 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 Large hiatal hernia. Diverticulosis without diverticulitis. Electronically Signed   By: Violeta Grey M.D.   On: 06/28/2023 20:41   DG Chest Port 1 View Result Date: 06/28/2023 CLINICAL DATA:  Upper abdominal pain, sepsis EXAM: PORTABLE CHEST 1 VIEW COMPARISON:  08/31/2022 FINDINGS: Large density in the right lower chest favoring hiatal hernia and adjacent passive atelectasis. Reverse lordotic projection. Upper normal heart size. The left lung appears clear. Atherosclerotic calcification of the aortic arch. IMPRESSION: 1. Large hiatal hernia with adjacent passive atelectasis in the right lower chest. 2. Upper normal heart size. 3. Aortic Atherosclerosis (ICD10-I70.0). Electronically Signed   By: Freida Jes M.D.   On: 06/28/2023 20:17    Patient Profile: Darlene [name] is a 83 y.o.  female with past medical history of A-fib with RVR on Eliquis, pulmonary hypertension, central sleep apnea on CPAP, morbid obesity, GERD, dysphagia with esophageal strictures requiring dilation, and hiatal hernia, admitted with intractable nausea and vomiting which initially started 2 weeks ago.   Assessment / Plan:  83 year old with a past medical history of GERD, benign esophageal stenosis and a large hiatal hernia admitted 06/29/2023 with N/V, coffee ground/black emesis and central abdominal pain.  Previously scheduled for an outpatient EGD with Dr. Yvone Herd 06/30/2023 due to having GERD, dysphagia and early satiety symptoms. CTA 4/14 was negative for active GI bleed. EGD was scheduled today but was cancelled after she developed acute respiratory failure secondary to flash pulmonary edema in setting of aflutter with RVR.  Hg stable 12.9. -EGD when cardiac and respiratory status stable -Continue Pantoprazole 40 mg IV twice daily -CBC in a.m.  RLQ pain. Prior outpatient CTAP 05/31/2023 showed a moderate right lower Spigelian hernia containing a nondilated loop of ascending colon, without fluid/inflammatory changes.  -Scheduled for hernia repair (hiatal hernia and spigelian hernia) with Dr. Aldon Hung 08/03/2023.   Atrial fibrillation on Eliquis. CHA2DS2-VASc Score = 5. Last dose of Eliquis was on Sunday 06/27/2023. -Continue to hold Eliquis  Acute respiratory failure, fluid overloaded/flash pulmonary edema with aflutter with RVR. Intubated on the vent. Vomited a large volume of brown emesis during intubation with aspiration. S/P bronchoscopy with lavage. On Levophed. Received Lasix IV. Started on Zosyn IV. -Management per CCM   Leukocytosis, WBC 12.2. Afebrile. -CBC in am   History of colon polyps.  Colonoscopy 05/2017 identified 2 sessile serrated/tubular adenomatous polyps removed from colon.   Chronic constipation, intermittently takes multiple laxatives.  Recent loose stools without taking  any laxatives.  No recent  antibiotic use. - Check GI pathogen panel  and C. Diff if patient demonstrates active diarrhea      Principal Problem:   Acute GI bleeding Active Problems:   Intractable nausea and vomiting   Acute hypoxic respiratory failure (HCC)     LOS: 0 days   Tory Freiberg  06/30/2023, 5:21 PM

## 2023-06-30 NOTE — Plan of Care (Signed)
  Problem: Education: Goal: Knowledge of General Education information will improve Description: Including pain rating scale, medication(s)/side effects and non-pharmacologic comfort measures Outcome: Progressing   Problem: Health Behavior/Discharge Planning: Goal: Ability to manage health-related needs will improve Outcome: Progressing   Problem: Clinical Measurements: Goal: Ability to maintain clinical measurements within normal limits will improve Outcome: Progressing Goal: Will remain free from infection Outcome: Progressing Goal: Diagnostic test results will improve Outcome: Progressing Goal: Respiratory complications will improve Outcome: Progressing Goal: Cardiovascular complication will be avoided Outcome: Progressing   Problem: Activity: Goal: Risk for activity intolerance will decrease Outcome: Progressing   Problem: Nutrition: Goal: Adequate nutrition will be maintained Outcome: Progressing   Problem: Coping: Goal: Level of anxiety will decrease Outcome: Progressing   Problem: Elimination: Goal: Will not experience complications related to bowel motility Outcome: Progressing Goal: Will not experience complications related to urinary retention Outcome: Progressing   Problem: Pain Managment: Goal: General experience of comfort will improve and/or be controlled Outcome: Progressing   Problem: Safety: Goal: Ability to remain free from injury will improve Outcome: Progressing   Problem: Skin Integrity: Goal: Risk for impaired skin integrity will decrease Outcome: Progressing   Problem: Education: Goal: Knowledge of disease or condition will improve Outcome: Progressing Goal: Understanding of medication regimen will improve Outcome: Progressing Goal: Individualized Educational Video(s) Outcome: Progressing   Problem: Activity: Goal: Ability to tolerate increased activity will improve Outcome: Progressing   Problem: Cardiac: Goal: Ability to achieve  and maintain adequate cardiopulmonary perfusion will improve Outcome: Progressing   Problem: Health Behavior/Discharge Planning: Goal: Ability to safely manage health-related needs after discharge will improve Outcome: Progressing   Problem: Activity: Goal: Ability to tolerate increased activity will improve Outcome: Progressing   Problem: Respiratory: Goal: Ability to maintain a clear airway and adequate ventilation will improve Outcome: Progressing   Problem: Role Relationship: Goal: Method of communication will improve Outcome: Progressing   Problem: Education: Goal: Ability to describe self-care measures that may prevent or decrease complications (Diabetes Survival Skills Education) will improve Outcome: Progressing Goal: Individualized Educational Video(s) Outcome: Progressing   Problem: Coping: Goal: Ability to adjust to condition or change in health will improve Outcome: Progressing   Problem: Fluid Volume: Goal: Ability to maintain a balanced intake and output will improve Outcome: Progressing   Problem: Health Behavior/Discharge Planning: Goal: Ability to identify and utilize available resources and services will improve Outcome: Progressing Goal: Ability to manage health-related needs will improve Outcome: Progressing   Problem: Metabolic: Goal: Ability to maintain appropriate glucose levels will improve Outcome: Progressing   Problem: Nutritional: Goal: Maintenance of adequate nutrition will improve Outcome: Progressing Goal: Progress toward achieving an optimal weight will improve Outcome: Progressing   Problem: Skin Integrity: Goal: Risk for impaired skin integrity will decrease Outcome: Progressing   Problem: Tissue Perfusion: Goal: Adequacy of tissue perfusion will improve Outcome: Progressing

## 2023-06-30 NOTE — Consult Note (Signed)
 Cardiology Consultation   Patient ID: Darlene [name] MRN: [id number]; DOB: November 19, 1940  Admit date: 06/29/2023 Date of Consult: 06/30/2023  PCP:  Park Meo, FNP   [facility] Providers Cardiologist:  Dina Rich, MD        Patient Profile:   Darlene [name] is a 83 y.o. female with a hx of longstanding A-fib who is being seen 06/30/2023 for the evaluation of A-fib with RVR at the request of Darlene Dezil, DO.  History of Present Illness:   Darlene [name] is a 83 year old female with hypertension, longstanding persistent A-fib, moderate pulmonary hypertension, hiatal hernia, OSA on CPAP, obesity, initially admitted with coffee-ground emesis.  She was awaiting EGD evaluation for coffee-ground emesis, and was off Eliquis for last 2 days as a result.  Diltiazem was also on hold last night.  Hemoglobin has stayed stable.  This morning, patient went into A-fib RVR, and has stayed in RVR for several hours, with worsening breathing.  She was on nonrebreather mask earlier, now put on BiPAP.  She has received IV Lasix 40 earlier today.  ABG shows acute hypoxic and hypercarbic respiratory failure.  Cardiology were consulted for management of A-fib.  Past Medical History:  Diagnosis Date   Anxiety    Chronic lower back pain    Depression    DJD (degenerative joint disease)    knees, neck   GERD (gastroesophageal reflux disease)    has had dilitation in the past   Hypertension     Past Surgical History:  Procedure Laterality Date   ABDOMINAL HYSTERECTOMY  03/1979   BREAST BIOPSY Left 1986 and 1987   CATARACT EXTRACTION, BILATERAL Bilateral 2014   Dr. Leonette Monarch, in [place]   EYE SURGERY  2024   [facility] Medications:  Prior to Admission medications   Medication Sig Start Date End Date Taking? Authorizing Provider  acetaminophen (TYLENOL) 500 MG tablet Take 500 mg by mouth every 6 (six) hours as needed for mild pain (pain score  1-3).   Yes [provider]  albuterol (VENTOLIN HFA) 108 (90 Base) MCG/ACT inhaler Inhale 2 puffs into the lungs every 6 (six) hours as needed for wheezing or shortness of breath. 05/22/22  Yes Cranford, Archie Patten, NP  apixaban (ELIQUIS) 5 MG TABS tablet TAKE 1 TABLET BY MOUTH TWICE  DAILY 06/07/23  Yes Branch, Dorothe Pea, MD  Ascorbic Acid (VITAMIN C) 500 MG CAPS Take 500 mg by mouth.    Yes [provider]  busPIRone (BUSPAR) 10 MG tablet TAKE 1/2 TO 1 TABLET BY MOUTH 3  TIMES DAILY AS NEEDED FOR  ANXIETY, MOOD OR IRRITABILITY 10/22/22  Yes Raynelle Dick, FNP  CHOLECALCIFEROL PO Take 5,000 Units by mouth daily.   Yes [provider]  Cyanocobalamin 2500 MCG TABS Take 2,500 mcg by mouth once a week.   Yes [provider]  cyclobenzaprine (FLEXERIL) 10 MG tablet Take 1/2 to 1 tablet    2 to 3 x /day if needed for Muscle Spasms Patient taking differently: Take 5-10 mg by mouth 3 (three) times daily as needed for muscle spasms. Take 1/2 to 1 tablet    2 to 3 x /day if needed for Muscle Spasms 04/25/19  Yes Lucky Cowboy, MD  diltiazem (CARDIZEM CD) 300 MG 24 hr capsule TAKE 1 CAPSULE BY MOUTH DAILY 10/22/22  Yes Branch, Dorothe Pea, MD  docusate sodium (COLACE) 100 MG capsule Take 100 mg  by mouth daily as needed for mild constipation.   Yes [provider]  escitalopram (LEXAPRO) 20 MG tablet TAKE 1 TABLET BY MOUTH DAILY FOR MOOD, ANXIETY &amp; IRRITABILITY 10/10/22  Yes Raynelle Dick, FNP  famotidine (PEPCID) 40 MG tablet Take 1 tablet (40 mg total) by mouth at bedtime. 06/28/23  Yes Dorrell, Molly Maduro, MD  ferrous sulfate 325 (65 FE) MG tablet Take 1 tablet (325 mg total) by mouth every other day. 01/15/20  Yes [name], NP  Magnesium 250 MG TABS Take 1 tablet by mouth daily.   Yes [provider]  metoprolol tartrate (LOPRESSOR) 25 MG tablet TAKE 2 TABLETS BY MOUTH IN THE  MORNING AND 1 TABLET BY MOUTH IN THE EVENING Patient taking differently:  Take 25-50 mg by mouth 2 (two) times daily. TAKE 2 TABLETS BY MOUTH IN THE  MORNING AND 1 TABLET BY MOUTH IN THE EVENING 05/05/23  Yes Branch, Dorothe Pea, MD  nitroGLYCERIN (NITROSTAT) 0.4 MG SL tablet Place 1 tablet (0.4 mg total) under the tongue every 5 (five) minutes as needed for chest pain. 02/05/20  Yes Strader, [place] M, PA-C  pantoprazole (PROTONIX) 40 MG tablet Take 1 tablet (40 mg total) by mouth 2 (two) times daily. Take  1 tablet  Daily  to prevent  Indigestion & Acid Reflux 06/28/23  Yes Dorrell, Molly Maduro, MD  Probiotic Product (PROBIOTIC DAILY PO) Take 1 tablet by mouth daily.   Yes [provider]  sucralfate (CARAFATE) 1 g tablet Take 2 tablets (2 g total) by mouth 4 (four) times daily -  with meals and at bedtime. 06/28/23  Yes Dorrell, Molly Maduro, MD  zinc gluconate 50 MG tablet Take 50 mg by mouth daily.   Yes [provider]    Inpatient Medications: Scheduled Meds:  diltiazem  300 mg Oral Daily   escitalopram  20 mg Oral Daily   furosemide  40 mg Intravenous Q12H   metoprolol tartrate  50 mg Oral Daily   And   metoprolol tartrate  25 mg Oral QHS   pantoprazole (PROTONIX) IV  40 mg Intravenous Q12H   Continuous Infusions:  diltiazem (CARDIZEM) infusion 5 mg/hr (06/30/23 1252)   potassium chloride 10 mEq (06/30/23 1259)   PRN Meds: acetaminophen **OR** acetaminophen, busPIRone, cyclobenzaprine, ondansetron (ZOFRAN) IV  Allergies:    Allergies  Allergen Reactions   Cephalosporins Rash   Keflex [Cephalexin] Rash    Social History:   Social History   Socioeconomic History   Marital status: Widowed    Spouse name: Not on file   Number of children: 2   Years of education: Not on file   Highest education level: 12th grade  Occupational History   Occupation: retired    Comment: worked in Designer, fashion/clothing  Tobacco Use   Smoking status: Never   Smokeless tobacco: Never  Vaping Use   Vaping status: Never Used  Substance and Sexual Activity   Alcohol use:  Yes    Alcohol/week: 3.0 standard drinks of alcohol    Types: 3 Standard drinks or equivalent per week    Comment: 3 nights out of the week   Drug use: No   Sexual activity: Not Currently  Other Topics Concern   Not on file  Social History Narrative   Not on file   Social Drivers of Health   Financial Resource Strain: Low Risk  (02/03/2021)   Overall Financial Resource Strain (CARDIA)    Difficulty of Paying Living Expenses: Not very hard  Food Insecurity:  No Food Insecurity (06/29/2023)   Hunger Vital Sign    Worried About Running Out of Food in the Last Year: Never true    Ran Out of Food in the Last Year: Never true  Transportation Needs: No Transportation Needs (06/29/2023)   PRAPARE - Administrator, Civil Service (Medical): No    Lack of Transportation (Non-Medical): No  Physical Activity: Inactive (04/28/2019)   Exercise Vital Sign    Days of Exercise per Week: 0 days    Minutes of Exercise per Session: 0 min  Stress: No Stress Concern Present (04/28/2019)   [employer] of Occupational Health - Occupational Stress Questionnaire    Feeling of Stress : Only a little  Social Connections: Moderately Isolated (06/29/2023)   Social Connection and Isolation Panel [NHANES]    Frequency of Communication with Friends and Family: More than three times a week    Frequency of Social Gatherings with Friends and Family: More than three times a week    Attends Religious Services: 1 to 4 times per year    Active Member of [employer] or Organizations: No    Attends Banker Meetings: Never    Marital Status: Widowed  Intimate Partner Violence: Not At Risk (06/29/2023)   Humiliation, Afraid, Rape, and Kick questionnaire    Fear of Current or Ex-Partner: No    Emotionally Abused: No    Physically Abused: No    Sexually Abused: No    Family History:    Family History  Problem Relation Age of Onset   Hypertension Mother    Heart disease Mother    Hypertension  Father    Heart disease Father    Tremor Sister    Dementia Brother    Hypertension Daughter    Colon cancer Neg Hx    Esophageal cancer Neg Hx    Liver disease Neg Hx      ROS:  Please see the history of present illness.   All other ROS reviewed and negative.     Physical Exam/Data:   Vitals:   06/30/23 1206 06/30/23 1219 06/30/23 1230 06/30/23 1247  BP: (!) 145/88 (!) 158/91 137/71 102/69  Pulse:  (!) 137  (!) 141  Resp: (!) 28 (!) 30    Temp:      TempSrc:      SpO2: (!) 84% (!) 85%  93%  Weight:      Height:        Intake/Output Summary (Last 24 hours) at 06/30/2023 1304 Last data filed at 06/29/2023 1441 Gross per 24 hour  Intake 1000 ml  Output --  Net 1000 ml      06/29/2023   12:02 PM 06/28/2023    5:16 PM 06/15/2023    9:51 AM  Last 3 Weights  Weight (lbs) 228 lb 9.9 oz 228 lb 8.1 oz 228 lb 8 oz  Weight (kg) 103.7 kg 103.65 kg 103.647 kg     Body mass index is 32.8 kg/m.  Physical Exam Vitals and nursing note reviewed.  Constitutional:      General: She is in acute distress.  Neck:     Vascular: No JVD.  Cardiovascular:     Rate and Rhythm: Tachycardia present. Rhythm irregular.     Heart sounds: Normal heart sounds. No murmur heard. Pulmonary:     Effort: Pulmonary effort is normal.     Breath sounds: Rales present. No wheezing.  Musculoskeletal:     Right lower leg: Edema (Trace) present.  Left lower leg: Edema (Trace) present.      Relevant CV Studies: Independently interpreted Echocardiogram 2021: Mild LVH.  LVEF 55 to 60%.  Indeterminate diastolic function. Moderate RV enlargement, normal systolic function. Moderate to severe biatrial enlargement. Mild tricuspid regurgitation. Elevated RA pressure. Moderate PAH   14-day monitor 01/2023: 100% A-fib burden Occasional PVCs  Laboratory Data:  High Sensitivity Troponin:   Recent Labs  Lab 06/28/23 1806 06/28/23 1944  TROPONINIHS 7 7     Chemistry Recent Labs  Lab  06/28/23 1806 06/29/23 1209 06/30/23 0632 06/30/23 1227  NA 140 142 140 142  K 3.4* 3.5 3.0* 3.3*  CL 101 103 102  --   CO2 28 27 26   --   GLUCOSE 126* 119* 126*  --   BUN 17 17 19   --   CREATININE 0.93 1.03* 0.83  --   CALCIUM 9.3 9.5 8.8*  --   GFRNONAA >60 54* >60  --   ANIONGAP 11 12 12   --     Recent Labs  Lab 06/28/23 1806 06/29/23 1209  PROT 6.1* 6.2*  ALBUMIN 3.6 3.7  AST 22 19  ALT 20 20  ALKPHOS 50 44  BILITOT 1.0 1.2   Lipids No results for input(s): "CHOL", "TRIG", "HDL", "LABVLDL", "LDLCALC", "CHOLHDL" in the last 168 hours.  Hematology Recent Labs  Lab 06/29/23 2132 06/30/23 0034 06/30/23 0632 06/30/23 1227  WBC 10.3 9.9 9.2  --   RBC 3.89 3.95 4.07  --   HGB 12.8 12.7 13.2 14.6  HCT 37.6 37.6 39.3 43.0  MCV 96.7 95.2 96.6  --   MCH 32.9 32.2 32.4  --   MCHC 34.0 33.8 33.6  --   RDW 13.0 13.1 13.1  --   PLT 186 201 200  --    Thyroid No results for input(s): "TSH", "FREET4" in the last 168 hours.  BNPNo results for input(s): "BNP", "PROBNP" in the last 168 hours.  DDimer No results for input(s): "DDIMER" in the last 168 hours.   Radiology/Studies:  DG Chest 1 View Result Date: 06/30/2023 CLINICAL DATA:  Hypoxia. EXAM: CHEST  1 VIEW COMPARISON:  06/28/2023. FINDINGS: Stable cardiomediastinal silhouette. Redemonstrated large hiatal hernia at the right base with adjacent atelectasis. Streaky opacities at the left lung base. No pneumothorax. Visualized osseous structures are unchanged. IMPRESSION: 1. Similar large hiatal hernia with adjacent atelectasis in the right lower lung. 2. Streaky opacities at the left lung base could reflect atelectasis or infiltrate. Electronically Signed   By: Hart Robinsons M.D.   On: 06/30/2023 12:57   CT ANGIO GI BLEED Result Date: 06/28/2023 CLINICAL DATA:  Upper abdominal pain, evaluate for GI bleeding EXAM: CTA ABDOMEN AND PELVIS WITHOUT AND WITH CONTRAST TECHNIQUE: Multidetector CT imaging of the abdomen and pelvis  was performed using the standard protocol during bolus administration of intravenous contrast. Multiplanar reconstructed images and MIPs were obtained and reviewed to evaluate the vascular anatomy. RADIATION DOSE REDUCTION: This exam was performed according to the departmental dose-optimization program which includes automated exposure control, adjustment of the mA and/or kV according to patient size and/or use of iterative reconstruction technique. CONTRAST:  OMNIPAQUE IOHEXOL 350 MG/ML SOLN COMPARISON:  05/31/2023 FINDINGS: VASCULAR Aorta: Atherosclerotic calcifications of the aorta are noted without aneurysmal dilatation or dissection. 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 Collateral flow from the SMA to the celiac axis branches is noted. SMA: Within normal limits. Renals: Both renal arteries are patent without evidence of aneurysm, dissection,  vasculitis, fibromuscular dysplasia or significant stenosis. IMA: Patent without evidence of aneurysm, dissection, vasculitis or significant stenosis. Inflow: Iliacs are within normal limits. Veins: No specific venous abnormality is noted. Review of the MIP images confirms the above findings. NON-VASCULAR Lower chest: Lung bases demonstrate compressive atelectasis in the right lower lobe secondary to large hiatal hernia. Hepatobiliary: Liver shows multiple cystic lesions which are stable in appearance from the prior exam. Gallbladder is well distended with stones within. Common bile duct has increased in size when compared with the prior exam. This measures up to 14 mm. There is a suggestion of a tiny distal stone in the coronal image number 39 of series 7. Correlation with laboratory values is recommended. Pancreas: Unremarkable. No pancreatic ductal dilatation or surrounding inflammatory changes. Spleen: Normal in size without focal abnormality. Adrenals/Urinary Tract: Adrenal glands are within normal limits. Kidneys demonstrate a normal  enhancement pattern bilaterally. An exophytic fatty lesion from the inferior aspect of the right kidney is again seen and stable measuring up to 4.1 cm. This is consistent with a angiomyolipoma. The bladder is decompressed. Stomach/Bowel: Large hiatal hernia is identified within the right chest. Scattered diverticular change of the colon is seen. No obstructive or inflammatory changes of the colon are noted. A loop of ascending colon extends through a anterior abdominal wall spigelian hernia lateral to the right rectus muscle. This is stable in appearance from the prior exam. No obstructive changes are seen. Small bowel is within normal limits. No pooling of contrast is identified to suggest GI hemorrhage. The appendix is not well visualized. No inflammatory changes to suggest appendicitis are noted. Lymphatic: No lymphadenopathy is noted. Reproductive: Status post hysterectomy. No adnexal masses. Other: No abdominal wall hernia or abnormality. No abdominopelvic ascites. Musculoskeletal: No acute or significant osseous findings. IMPRESSION: VASCULAR No evidence of active GI hemorrhage. 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 Large hiatal hernia. Diverticulosis without diverticulitis. Electronically Signed   By: Alcide Clever M.D.   On: 06/28/2023 20:41   DG Chest Port 1 View Result Date: 06/28/2023 CLINICAL DATA:  Upper abdominal pain, sepsis EXAM: PORTABLE CHEST 1 VIEW COMPARISON:  08/31/2022 FINDINGS: Large density in the right lower chest favoring hiatal hernia and adjacent passive atelectasis. Reverse lordotic projection. Upper normal heart size. The left lung appears clear. Atherosclerotic calcification of the aortic arch. IMPRESSION: 1. Large hiatal hernia with adjacent passive atelectasis in the right lower chest. 2. Upper normal heart size. 3. Aortic Atherosclerosis (ICD10-I70.0). Electronically Signed    By: Gaylyn Rong M.D.   On: 06/28/2023 20:17     Assessment and Plan:   83 year old female with hypertension, longstanding persistent A-fib, moderate pulmonary hypertension, hiatal hernia, OSA on CPAP, obesity, initially admitted with coffee-ground emesis with stable hemoglobin, now in A-fib with RVR, acute hypoxic and hypercarbic respiratory failure  A-fib/flutter with RVR: Known longstanding persistent A-fib, on diltiazem 360 mg and metoprolol tartrate 25 mg twice daily, along with Eliquis 5 mg twice daily at baseline. Medications have been on hold pending EGD evaluation for coffee-ground emesis. Recommend resuming IV diltiazem drip, she also received her home diltiazem dose a few minutes ago. I anticipate she will need several hours on IV diltiazem to achieve A-fib rate control.  Given interruption of anticoagulation, as well as severe biatrial enlargement on echocardiogram in 2021, I am pessimistic about her chances of successful cardioversion. If hypotension ensues, she may need IV amiodarone. Recommended another dose  of IV Lasix 40 mg in addition to 40 mg received earlier today. She needs aggressive diuresis, strict I's/O monitoring. After A-fib rate is controlled, recommend echocardiogram.  Acute hypoxic/hypercarbic respiratory failure: Underlying obesity, OSA, now developing pulmonary edema due to A-fib with RVR. That she is currently on BiPAP. Hopefully, rate control and diuresis, along with BiPAP will avoid need for invasive airway management.  However, she remains at high risk for need for mechanical ventilation. Appreciate PCCM input.  Coffee-ground emesis: Fortunately, hemoglobin is stable.  She is too tenuous for any invasive GI workup at this time  Discussed with primary team, and patient's family at bedside.   Risk Assessment/Risk Scores:       New York  Heart Association (NYHA) Functional Class NYHA Class IV  CHA2DS2-VASc Score = 5   This indicates a 7.2%  annual risk of stroke. The patient's score is based upon: CHF History: 1 HTN History: 1 Diabetes History: 0 Stroke History: 0 Vascular Disease History: 0 Age Score: 2 Gender Score: 1      CRITICAL CARE Performed by: Fransico Ivy   Total critical care time: 30 minutes   Critical care time was exclusive of separately billable procedures and treating other patients.   Critical care was necessary to treat or prevent imminent or life-threatening deterioration.   Critical care was time spent personally by me on the following activities: development of treatment plan with patient and/or surrogate as well as nursing, discussions with consultants, evaluation of patient's response to treatment, examination of patient, obtaining history from patient or surrogate, ordering and performing treatments and interventions, ordering and review of laboratory studies, ordering and review of radiographic studies, pulse oximetry and re-evaluation of patient's condition.     For questions or updates, please contact [facility] Please consult [email or web address] for contact info under    Signed, Cody Das, MD  06/30/2023 1:04 PM

## 2023-06-30 NOTE — Procedures (Signed)
 Intubation Procedure Note  Darlene [name]  [id number]  September 26, 1940  Date:06/30/23  Time:2:18 PM   Provider Performing:Austan Nicholl R Shalana Jardin    Procedure: Intubation (31500)  Indication(s) Respiratory Failure  Consent Risks of the procedure as well as the alternatives and risks of each were explained to the patient and/or caregiver.  Consent for the procedure was obtained and is signed in the bedside chart   Anesthesia Etomidate, Fentanyl, and Rocuronium   Time Out Verified patient identification, verified procedure, site/side was marked, verified correct patient position, special equipment/implants available, medications/allergies/relevant history reviewed, required imaging and test results available.   Sterile Technique Usual hand hygeine, masks, and gloves were used   Procedure Description Patient positioned in bed supine.  Sedation given as noted above.  Patient was intubated with endotracheal tube using Glidescope.  View was Grade 1 full glottis .  Number of attempts was 1.  Colorimetric CO2 detector was consistent with tracheal placement.  Copious dark emesis noted during intubation, likely aspiration, Dr. Felipe Horton passed ETT    Complications/Tolerance Transient hypotension improved with neo pushes and norepi  Chest X-ray is ordered to verify placement.   EBL Minimal   Specimen(s) None   Darlene Boozer Lavel Rieman, PA-C

## 2023-06-30 NOTE — Significant Event (Signed)
 Rapid Response Event Note   Reason for Call :  Second set of eyes during rounds  Initial Focused Assessment:  Asked to see pt while rounding due to increased HR 150s and "GIB." Crackles/rhonchi heard throughout. Pt tachypneic, unable to talk in complete sentences--daughter notes this is a change. Skin warm and dry. Emesis bag at bedside, denies vomiting episodes. Pt stating "I'm tired."  145/88 (102) HR 152 RR 30 O2 81-86 15L [place] (just increased from 10L salter)  Interventions/Plan of Care:  EKG RVR ABG 7.26/69/52 Lasix IV, total 80mg  Dilt gtt ordered, stopped en route d/t decreased BP Bipap CCM ICU transfer: intubate, CL placement, bronch  Event Summary:  MD Notified: Marquette Sites MD, Doree Games MD Call Time: 1200 rounding Arrival Time: 1200 rounding End Time: 1400  Ever Hiss, RN

## 2023-06-30 NOTE — Procedures (Signed)
 Bronchoscopy Procedure Note  Darlene [name]  [id number]  [birth year]  Date:06/30/23  Time:2:14 PM   Provider Performing:Zacheriah Stumpe C Felipe Horton   Procedure(s):  Flexible bronchoscopy with bronchial alveolar lavage [phone])  Indication(s) Aspiration  Consent Unable to obtain consent due to emergent nature of procedure.  Anesthesia In place for ETT   Time Out Verified patient identification, verified procedure, site/side was marked, verified correct patient position, special equipment/implants available, medications/allergies/relevant history reviewed, required imaging and test results available.   Sterile Technique Usual hand hygiene, masks, gowns, and gloves were used   Procedure Description Bronchoscope advanced through endotracheal tube and into airway.  Airways were examined down to subsegmental level with findings noted below.   Following diagnostic evaluation, BAL(s) performed in RLL with normal saline and return of murky debris filled fluid  Findings:  - ETT in good position - Dark fluid staining both lungs worse in lower lobes c/w aspiration event   Complications/Tolerance None; patient tolerated the procedure well. Chest X-ray is not needed post procedure.   EBL Minimal   Specimen(s) RLL BAL

## 2023-06-30 NOTE — Progress Notes (Signed)
 Pharmacy Antibiotic Note  Darlene [name] is a 83 y.o. female admitted on 06/29/2023 with pneumonia and sepsis.  Pharmacy has been consulted for zosyn dosing.  Pt with afib who was admitted for GIB. Currently on dilt drip. Not stable enough for EGD. Now intubated. Zosyn ordered for PNA/sepsis after large vomitus during intubation.  Scr <1  Plan: Zosyn 3.375g IV q8  Height: 5\' 10"  (177.8 cm) Weight: 95.7 kg (210 lb 15.7 oz) IBW/kg (Calculated) : 68.5  Temp (24hrs), Avg:98.3 F (36.8 C), Min:97.9 F (36.6 C), Max:98.7 F (37.1 C)  Recent Labs  Lab 06/28/23 1721 06/28/23 1806 06/28/23 1944 06/29/23 1209 06/29/23 2132 06/30/23 0034 06/30/23 0632  WBC  --  8.8  --  12.2* 10.3 9.9 9.2  CREATININE  --  0.93  --  1.03*  --   --  0.83  LATICACIDVEN 1.5  --  1.5  --   --   --   --     Estimated Creatinine Clearance: 64.4 mL/min (by C-G formula based on SCr of 0.83 mg/dL).    Allergies  Allergen Reactions   Cephalosporins Rash   Keflex [Cephalexin] Rash    Antimicrobials this admission: 4/16 zosyn>>  Dose adjustments this admission:  Microbiology results:   Ivery Marking, PharmD, BCIDP, AAHIVP, CPP Infectious Disease Pharmacist 06/30/2023 2:04 PM

## 2023-06-30 NOTE — Progress Notes (Signed)
 06/30/2023 Large volume vomitus peri-intubation. BP is down. Will start zosyn, give fluids, suspect will have large septic response.  Ardelle Kos MD PCCM

## 2023-07-01 ENCOUNTER — Encounter (HOSPITAL_COMMUNITY)
Admission: EM | Disposition: A | Discharge status: Skilled Nursing Facility | Payer: Self-pay | Source: Home / Self Care | Attending: Internal Medicine

## 2023-07-01 ENCOUNTER — Telehealth: Payer: Self-pay | Admitting: Cardiology

## 2023-07-01 DIAGNOSIS — K2970 Gastritis, unspecified, without bleeding: Secondary | ICD-10-CM | POA: Diagnosis not present

## 2023-07-01 DIAGNOSIS — I2729 Other secondary pulmonary hypertension: Secondary | ICD-10-CM | POA: Diagnosis not present

## 2023-07-01 DIAGNOSIS — J9602 Acute respiratory failure with hypercapnia: Secondary | ICD-10-CM | POA: Diagnosis not present

## 2023-07-01 DIAGNOSIS — I4811 Longstanding persistent atrial fibrillation: Secondary | ICD-10-CM | POA: Diagnosis not present

## 2023-07-01 DIAGNOSIS — K922 Gastrointestinal hemorrhage, unspecified: Secondary | ICD-10-CM | POA: Diagnosis not present

## 2023-07-01 DIAGNOSIS — I480 Paroxysmal atrial fibrillation: Secondary | ICD-10-CM | POA: Diagnosis not present

## 2023-07-01 DIAGNOSIS — J690 Pneumonitis due to inhalation of food and vomit: Secondary | ICD-10-CM

## 2023-07-01 DIAGNOSIS — Z4659 Encounter for fitting and adjustment of other gastrointestinal appliance and device: Secondary | ICD-10-CM | POA: Diagnosis not present

## 2023-07-01 DIAGNOSIS — J9601 Acute respiratory failure with hypoxia: Secondary | ICD-10-CM | POA: Diagnosis not present

## 2023-07-01 DIAGNOSIS — K920 Hematemesis: Secondary | ICD-10-CM | POA: Diagnosis not present

## 2023-07-01 HISTORY — PX: ESOPHAGOGASTRODUODENOSCOPY: SHX5428

## 2023-07-01 LAB — POCT I-STAT 7, (LYTES, BLD GAS, ICA,H+H)
Acid-Base Excess: 8 mmol/L — ABNORMAL HIGH (ref 0.0–2.0)
Bicarbonate: 31.1 mmol/L — ABNORMAL HIGH (ref 20.0–28.0)
Calcium, Ion: 1.16 mmol/L (ref 1.15–1.40)
HCT: 29 % — ABNORMAL LOW (ref 36.0–46.0)
Hemoglobin: 9.9 g/dL — ABNORMAL LOW (ref 12.0–15.0)
O2 Saturation: 94 %
Patient temperature: 97.9
Potassium: 3.4 mmol/L — ABNORMAL LOW (ref 3.5–5.1)
Sodium: 142 mmol/L (ref 135–145)
TCO2: 32 mmol/L (ref 22–32)
pCO2 arterial: 34.5 mmHg (ref 32–48)
pH, Arterial: 7.561 — ABNORMAL HIGH (ref 7.35–7.45)
pO2, Arterial: 58 mmHg — ABNORMAL LOW (ref 83–108)

## 2023-07-01 LAB — POCT I-STAT EG7
Acid-Base Excess: 9 mmol/L — ABNORMAL HIGH (ref 0.0–2.0)
Bicarbonate: 31.8 mmol/L — ABNORMAL HIGH (ref 20.0–28.0)
Calcium, Ion: 1.16 mmol/L (ref 1.15–1.40)
HCT: 29 % — ABNORMAL LOW (ref 36.0–46.0)
Hemoglobin: 9.9 g/dL — ABNORMAL LOW (ref 12.0–15.0)
O2 Saturation: 80 %
Patient temperature: 97.9
Potassium: 3.3 mmol/L — ABNORMAL LOW (ref 3.5–5.1)
Sodium: 141 mmol/L (ref 135–145)
TCO2: 33 mmol/L — ABNORMAL HIGH (ref 22–32)
pCO2, Ven: 35.3 mmHg — ABNORMAL LOW (ref 44–60)
pH, Ven: 7.561 — ABNORMAL HIGH (ref 7.25–7.43)
pO2, Ven: 37 mmHg (ref 32–45)

## 2023-07-01 LAB — BASIC METABOLIC PANEL WITH GFR
Anion gap: 10 (ref 5–15)
BUN: 22 mg/dL (ref 8–23)
CO2: 30 mmol/L (ref 22–32)
Calcium: 9 mg/dL (ref 8.9–10.3)
Chloride: 102 mmol/L (ref 98–111)
Creatinine, Ser: 0.98 mg/dL (ref 0.44–1.00)
GFR, Estimated: 57 mL/min — ABNORMAL LOW (ref >60)
Glucose, Bld: 136 mg/dL — ABNORMAL HIGH (ref 70–99)
Potassium: 3.3 mmol/L — ABNORMAL LOW (ref 3.5–5.1)
Sodium: 142 mmol/L (ref 135–145)

## 2023-07-01 LAB — CBC
HCT: 31.4 % — ABNORMAL LOW (ref 36.0–46.0)
Hemoglobin: 11 g/dL — ABNORMAL LOW (ref 12.0–15.0)
MCH: 32.8 pg (ref 26.0–34.0)
MCHC: 35 g/dL (ref 30.0–36.0)
MCV: 93.7 fL (ref 80.0–100.0)
Platelets: 127 10*3/uL — ABNORMAL LOW (ref 150–400)
RBC: 3.35 MIL/uL — ABNORMAL LOW (ref 3.87–5.11)
RDW: 12.9 % (ref 11.5–15.5)
WBC: 7.5 10*3/uL (ref 4.0–10.5)
nRBC: 0 % (ref 0.0–0.2)

## 2023-07-01 LAB — HEMOGLOBIN AND HEMATOCRIT, BLOOD
HCT: 30.7 % — ABNORMAL LOW (ref 36.0–46.0)
HCT: 31 % — ABNORMAL LOW (ref 36.0–46.0)
Hemoglobin: 10.5 g/dL — ABNORMAL LOW (ref 12.0–15.0)
Hemoglobin: 10.6 g/dL — ABNORMAL LOW (ref 12.0–15.0)

## 2023-07-01 LAB — PHOSPHORUS: Phosphorus: 2.4 mg/dL — ABNORMAL LOW (ref 2.5–4.6)

## 2023-07-01 LAB — GLUCOSE, CAPILLARY
Glucose-Capillary: 129 mg/dL — ABNORMAL HIGH (ref 70–99)
Glucose-Capillary: 126 mg/dL — ABNORMAL HIGH (ref 70–99)
Glucose-Capillary: 122 mg/dL — ABNORMAL HIGH (ref 70–99)
Glucose-Capillary: 138 mg/dL — ABNORMAL HIGH (ref 70–99)
Glucose-Capillary: 149 mg/dL — ABNORMAL HIGH (ref 70–99)

## 2023-07-01 LAB — MAGNESIUM: Magnesium: 2.1 mg/dL (ref 1.7–2.4)

## 2023-07-01 LAB — TROPONIN I (HIGH SENSITIVITY): Troponin I (High Sensitivity): 17 ng/L (ref <18)

## 2023-07-01 LAB — TSH: TSH: 0.599 u[IU]/mL (ref 0.350–4.500)

## 2023-07-01 LAB — T4, FREE: Free T4: 1.67 ng/dL — ABNORMAL HIGH (ref 0.61–1.12)

## 2023-07-01 LAB — LACTIC ACID, PLASMA: Lactic Acid, Venous: 1.8 mmol/L (ref 0.5–1.9)

## 2023-07-01 SURGERY — EGD (ESOPHAGOGASTRODUODENOSCOPY)
Anesthesia: Moderate Sedation

## 2023-07-01 MED ORDER — SODIUM CHLORIDE 0.9 % IV SOLN: INTRAVENOUS | Status: DC

## 2023-07-01 MED ORDER — DOCUSATE SODIUM 50 MG/5ML PO LIQD
100.0000 mg | Freq: Two times a day (BID) | ORAL | Status: DC
  Administered 2023-07-01: 100 mg via ORAL
  Filled 2023-07-01 (×2): qty 10

## 2023-07-01 MED ORDER — IPRATROPIUM-ALBUTEROL 0.5-2.5 (3) MG/3ML IN SOLN
3.0000 mL | Freq: Four times a day (QID) | RESPIRATORY_TRACT | Status: DC
  Administered 2023-07-01 – 2023-07-14 (×52): 3 mL via RESPIRATORY_TRACT
  Filled 2023-07-01 (×52): qty 3

## 2023-07-01 MED ORDER — MIDAZOLAM HCL 2 MG/2ML IJ SOLN: 10.0000 mg | Freq: Once | INTRAMUSCULAR | Status: AC

## 2023-07-01 MED ORDER — POLYETHYLENE GLYCOL 3350 17 G PO PACK
17.0000 g | PACK | Freq: Every day | ORAL | Status: DC
  Administered 2023-07-01: 17 g via ORAL
  Filled 2023-07-01 (×2): qty 1

## 2023-07-01 MED ORDER — MIDAZOLAM HCL 2 MG/2ML IJ SOLN
INTRAMUSCULAR | Status: AC
  Administered 2023-07-01: 5 mg via INTRAVENOUS
  Filled 2023-07-01: qty 10

## 2023-07-01 MED ORDER — METOCLOPRAMIDE HCL 5 MG/ML IJ SOLN
10.0000 mg | Freq: Three times a day (TID) | INTRAMUSCULAR | Status: DC
  Administered 2023-07-01 – 2023-07-06 (×15): 10 mg via INTRAVENOUS
  Filled 2023-07-01 (×15): qty 2

## 2023-07-01 MED ORDER — POTASSIUM CHLORIDE 10 MEQ/100ML IV SOLN
10.0000 meq | INTRAVENOUS | Status: AC
  Administered 2023-07-01 (×4): 10 meq via INTRAVENOUS
  Filled 2023-07-01 (×4): qty 100

## 2023-07-01 NOTE — Progress Notes (Signed)
   Patient Name: Darlene [name] Date of Encounter: 07/01/2023 [facility] Cardiologist: Dina Rich, MD   Interval Summary  .    Intubated, sedated On pressors Concern for aspiration pneumonia A-fib rate much better controlled Reportedly getting EGD today   Vital Signs .    Vitals:   07/01/23 0708 07/01/23 0800 07/01/23 0900 07/01/23 1110  BP:  139/80 (!) 138/55   Pulse:  83 85   Resp:  (!) 24 (!) 24   Temp: 97.9 F (36.6 C)   97.8 F (36.6 C)  TempSrc: Oral   Oral  SpO2:  92% 92%   Weight:      Height:        Intake/Output Summary (Last 24 hours) at 07/01/2023 1137 Last data filed at 07/01/2023 0600 Gross per 24 hour  Intake 1273.01 ml  Output 2290 ml  Net -1016.99 ml      06/30/2023    1:16 PM 06/29/2023   12:02 PM 06/28/2023    5:16 PM  Last 3 Weights  Weight (lbs) 210 lb 15.7 oz 228 lb 9.9 oz 228 lb 8.1 oz  Weight (kg) 95.7 kg 103.7 kg 103.65 kg      Telemetry/ECG    Telemetry 07/01/2023- Personally Reviewed A-fib with controlled ventricular rate  Physical Exam .   Physical Exam Vitals and nursing note reviewed.  Constitutional:      General: She is not in acute distress. Neck:     Vascular: No JVD.  Cardiovascular:     Rate and Rhythm: Normal rate. Rhythm irregular.     Heart sounds: Normal heart sounds. No murmur heard. Pulmonary:     Effort: Pulmonary effort is normal.     Breath sounds: Normal breath sounds. No wheezing or rales.  Neurological:     Comments: Intubated, sedated      Assessment & Plan .     83 year old female with hypertension, longstanding persistent A-fib, moderate pulmonary hypertension, hiatal hernia, OSA on CPAP, obesity, initially admitted with coffee-ground emesis with stable hemoglobin, now in A-fib with RVR, acute hypoxic and hypercarbic respiratory failure   Persistent A-fib: RVR episode on 06/30/2023 likely as result of aspiration pneumonia. A-fib rate much better controlled now even without  diltiazem at 120 mg, instead of 360 mg, if and when she is off pressors and blood pressure will tolerate. On Eliquis 5 mg twice daily at baseline, anticoagulation currently on hold due to anemia and upcoming workup with EGD. Defer resumption of anticoagulation to GI.    RV enlargement: CTA with no PE.  She has known pulmonary hypertension likely from longstanding A-fib at baseline. No additional workup recommended at this time. In future, after her recovery, could consider right heart catheterization for further definition of pulmonary hypertension.  However, I do not think this will change management, as pulmonary hypertension is most likely to be group 2 or group 3 in the setting of known A-fib, underlying OSA.  Acute hypoxic/hypercarbic respiratory failure, septic shock: Secondary to aspiration pneumonia. Management as per PCCM.   Coffee-ground emesis: Fortunately, hemoglobin is stable. Reportedly getting EGD today while intubated.  Cardiology will sign off. She will follow-up with Dr. Wyline Mood outpatient in [place]. Please call us back in case of any questions.  For questions or updates, please contact  [facility] Please consult [email or web address] for contact info under        Signed, Elder Negus, MD

## 2023-07-01 NOTE — Interval H&P Note (Addendum)
 History and Physical Interval Note:  07/01/2023 2:22 PM  Darlene [name]  has presented today for surgery, with the diagnosis of Coffee ground emesis, anemia.  The various methods of treatment have been discussed with the patient and family. After consideration of risks, benefits and other options for treatment, the patient has consented to  Procedure(s) with comments: EGD (ESOPHAGOGASTRODUODENOSCOPY) (N/A) - Sedation provided by ICU as a surgical intervention.  The patient's history has been reviewed, patient examined, no change in status, stable for surgery.  I have reviewed the patient's chart and labs.  Questions were answered to the patient's satisfaction.    Patient's hemoglobin did drop today. Discussed the idea of an upper endoscopy with the patient's sister [facility]), and she is agreeable to proceeding with EGD today to allow for us  to look for a source of coffee ground emesis. Patient is already intubated and sedated so she should be stable from a respiratory standpoint for this procedure. I discussed this with the ICU team who will help provide sedation for us .  Niv Darley C Sally Reimers

## 2023-07-01 NOTE — Progress Notes (Signed)
 GI communications: EGD showed a large amount of food and coffee ground material in a huge hiatal hernia. Saw some oozing from Cameron erosions in the hiatal hernia, which GI suspects is the source of her bleeding. Patient is high risk for aspiration due to her hernia, and it seems that food is getting stuck in her hernia as well. Rec PPI BID at least until her hernia repair. Will start her on some Reglan to try to help move some of the food in her hernia.  For nutrition, GI thinks a [place] tube or TPN may be reasonable to try to reduce her risk of aspiration. Could also consider another trial of oral diet while on Reglan but would be cautious. Since her respiratory status is the main problem right now, GI will sign off for now but please feel free to get GI involved again if there are any questions.

## 2023-07-01 NOTE — Telephone Encounter (Signed)
Sent to schedulers

## 2023-07-01 NOTE — Progress Notes (Signed)
 NAME:  Darlene [name], MRN:  [id number], DOB:  [birth year], LOS: 1 ADMISSION DATE:  06/29/2023, CONSULTATION DATE:  06/30/2023 REFERRING MD: TRH, CHIEF COMPLAINT: afib/flutter RVR    History of Present Illness:  Darlene [name] is a 83 y.o. F with PMH significant for atrial fibrillation on eliquis, OSA on CPAP, HTN, HL, GERD and esophageal stricwho presented to the ED 4/15 with coffee ground emesis.  This initially began about one week before discharge and she was supposed to follow up for a scope outpatient for this, but was told this would need to be done inpatient.  CTA abd/pelvis did not reveal any acute source of bleeding, did show high-grade stenosis involving celiac access with collateral flow from the SMA.  Patient is already scheduled for hiatal hernia and spigelian hernia repair with Dr. Doylene Canard in 07/2023.  Pt has not required any transfusions    She was admitted and the plan was to head to EGD and then be discharged, however she has been NPO and her rate controlling agents were held and pt developed Afib with RVR and hypoxia/hypercarbia.  She was placed on bipap and given her recent emesis PCCM was consulted.  Family agreed to intubation and pt started on a cardizem gtt   Pertinent  Medical History  Anxiety, Chronic lower back pain, Depression, DJD (degenerative joint disease), GERD (gastroesophageal reflux disease), and Hypertension.   Significant Hospital Events: Including procedures, antibiotic start and stop dates in addition to other pertinent events   4/15 admit with coffee ground emesis for inpatient EGD 4/16 afib with RVR, concern for pulm edema, PCCM consult and ICU txfr 4/17: ETT air leak, fixed with more balloon inflation. On Precedex and Fentanyl drips for sedation. Controlled Afib  Interim History / Subjective:    Objective   Blood pressure (!) 138/55, pulse 85, temperature 97.9 F (36.6 C), temperature source Oral, resp. rate (!) 24, height 5\' 10"  (1.778 m), weight 95.7  kg, SpO2 92%.    Vent Mode: PRVC FiO2 (%):  [60 %-100 %] 70 % Set Rate:  [20 bmp-24 bmp] 20 bmp Vt Set:  [540 mL-550 mL] 550 mL PEEP:  [5 cmH20-10 cmH20] 10 cmH20 Pressure Support:  [5 cmH20] 5 cmH20 Plateau Pressure:  [20 cmH20-25 cmH20] 21 cmH20   Intake/Output Summary (Last 24 hours) at 07/01/2023 1020 Last data filed at 07/01/2023 0600 Gross per 24 hour  Intake 1273.01 ml  Output 2290 ml  Net -1016.99 ml   Filed Weights   06/29/23 1202 06/30/23 1316  Weight: 103.7 kg 95.7 kg    Examination: General: sedated, intubated.   HENT: PERL. No LNE or thyromegaly. No JVD Lungs: symmetrical air entry bilaterally. No crackles or wheezing Cardiovascular: NL S1/S2. No m/g/r Abdomen: no distension or tenderness Extremities: trace edema. Symmetrical  Neuro: sedated    Resolved Hospital Problem list   Afib/RVR---> controlled rate  Assessment & Plan:  Atrial Fibrillation/RVR with hypotension: rate is controlled now and hypotension: resolved Lactic acidosis: resolved  Echo EF 50%  Not a candidate for [facility] for UGIB, pending EGD findings  K>4, Mg>2 D/c Lasix D/w cardiology   Acute hypoxic and hypercarbic respiratory failure: due to aspiration PNA -ABG -d/w RT -Duoneb -Zosyn #2 -MRSA screening: negative  -F/u Cx -Dietitian consult -Glycemic control    UGIB with coffee ground emesis in the setting of hiatal hernia and esophageal strictures -GI is planning EGD today  -Serial H/H -PPI  Constipation -Laxatives   OSA/CPAP  Obesity, stage 1   Best  Practice (right click and "Reselect all SmartList Selections" daily)   Diet/type: NPO w/ oral meds DVT prophylaxis SCD Pressure ulcer(s): N/A GI prophylaxis: PPI Lines: Central line Foley:  Yes, and it is still needed Code Status:  full code Last date of multidisciplinary goals of care discussion []   Labs   CBC: Recent Labs  Lab 06/28/23 1806 06/29/23 1209 06/29/23 2132 06/30/23 0034 06/30/23 1610 06/30/23 1227  06/30/23 1549 06/30/23 1617 07/01/23 0626 07/01/23 0948 07/01/23 1004  WBC 8.8   < > 10.3 9.9 9.2  --   --  7.4 7.5  --   --   NEUTROABS 6.4  --   --   --   --   --   --   --   --   --   --   HGB 13.6   < > 12.8 12.7 13.2   < > 12.2 12.9 11.0* 9.9* 9.9*  HCT 40.0   < > 37.6 37.6 39.3   < > 36.0 38.8 31.4* 29.0* 29.0*  MCV 94.6   < > 96.7 95.2 96.6  --   --  97.2 93.7  --   --   PLT 225   < > 186 201 200  --   --  207 127*  --   --    < > = values in this interval not displayed.    Basic Metabolic Panel: Recent Labs  Lab 06/28/23 1806 06/29/23 1209 06/30/23 9604 06/30/23 1149 06/30/23 1227 06/30/23 1549 06/30/23 1617 07/01/23 0626 07/01/23 0948 07/01/23 1004  NA 140 142 140  --    < > 142 142 142 141 142  K 3.4* 3.5 3.0*  --    < > 3.2* 3.4* 3.3* 3.3* 3.4*  CL 101 103 102  --   --   --  101 102  --   --   CO2 28 27 26   --   --   --  29 30  --   --   GLUCOSE 126* 119* 126*  --   --   --  159* 136*  --   --   BUN 17 17 19   --   --   --  23 22  --   --   CREATININE 0.93 1.03* 0.83  --   --   --  0.97 0.98  --   --   CALCIUM 9.3 9.5 8.8*  --   --   --  8.4* 9.0  --   --   MG  --   --   --  1.8  --   --  1.5* 2.1  --   --   PHOS  --   --   --  3.7  --   --   --  2.4*  --   --    < > = values in this interval not displayed.   GFR: Estimated Creatinine Clearance: 54.5 mL/min (by C-G formula based on SCr of 0.98 mg/dL). Recent Labs  Lab 06/28/23 1721 06/28/23 1806 06/28/23 1944 06/29/23 1209 06/30/23 0034 06/30/23 5409 06/30/23 1617 07/01/23 0626 07/01/23 0752  WBC  --    < >  --    < > 9.9 9.2 7.4 7.5  --   LATICACIDVEN 1.5  --  1.5  --   --   --  2.9*  --  1.8   < > = values in this interval not displayed.    Liver Function Tests: Recent Labs  Lab 06/28/23 1806 06/29/23 1209  AST 22 19  ALT 20 20  ALKPHOS 50 44  BILITOT 1.0 1.2  PROT 6.1* 6.2*  ALBUMIN 3.6 3.7   Recent Labs  Lab 06/28/23 1806  LIPASE 24   No results for input(s): "AMMONIA" in the  last 168 hours.  ABG    Component Value Date/Time   PHART 7.561 (H) 07/01/2023 1004   PCO2ART 34.5 07/01/2023 1004   PO2ART 58 (L) 07/01/2023 1004   HCO3 31.1 (H) 07/01/2023 1004   TCO2 32 07/01/2023 1004   O2SAT 94 07/01/2023 1004     Coagulation Profile: Recent Labs  Lab 06/28/23 1806 06/30/23 0632  INR 1.4* 1.3*    Cardiac Enzymes: No results for input(s): "CKTOTAL", "CKMB", "CKMBINDEX", "TROPONINI" in the last 168 hours.  HbA1C: Hgb A1c MFr Bld  Date/Time Value Ref Range Status  02/16/2023 10:45 AM 5.6 <5.7 % of total Hgb Final    Comment:    For the purpose of screening for the presence of diabetes: . <5.7%       Consistent with the absence of diabetes 5.7-6.4%    Consistent with increased risk for diabetes             (prediabetes) > or =6.5%  Consistent with diabetes . This assay result is consistent with a decreased risk of diabetes. . Currently, no consensus exists regarding use of hemoglobin A1c for diagnosis of diabetes in children. . According to American Diabetes Association (ADA) guidelines, hemoglobin A1c <7.0% represents optimal control in non-pregnant diabetic patients. Different metrics may apply to specific patient populations.  Standards of Medical Care in Diabetes(ADA). Aaron Aas   12/01/2022 12:23 PM 5.8 (H) <5.7 % of total Hgb Final    Comment:    For someone without known diabetes, a hemoglobin  A1c value between 5.7% and 6.4% is consistent with prediabetes and should be confirmed with a  follow-up test. . For someone with known diabetes, a value <7% indicates that their diabetes is well controlled. A1c targets should be individualized based on duration of diabetes, age, comorbid conditions, and other considerations. . This assay result is consistent with an increased risk of diabetes. . Currently, no consensus exists regarding use of hemoglobin A1c for diagnosis of diabetes for children. .     CBG: Recent Labs  Lab  06/30/23 1502 06/30/23 1936 06/30/23 2353 07/01/23 0353 07/01/23 0707  GLUCAP 116* 148* 135* 129* 126*    Review of Systems:   Intubated   Past Medical History:  She,  has a past medical history of Anxiety, Chronic lower back pain, Depression, DJD (degenerative joint disease), GERD (gastroesophageal reflux disease), and Hypertension.   Surgical History:   Past Surgical History:  Procedure Laterality Date   ABDOMINAL HYSTERECTOMY  03/1979   BREAST BIOPSY Left 1986 and 1987   CATARACT EXTRACTION, BILATERAL Bilateral 2014   Dr. Michaeleen Adler, in [place]   EYE SURGERY  2024   Dr [facility] in [facility]     Social History:   reports that she has never smoked. She has never used smokeless tobacco. She reports current alcohol use of about 3.0 standard drinks of alcohol per week. She reports that she does not use drugs.   Family History:  Her family history includes Dementia in her brother; Heart disease in her father and mother; Hypertension in her daughter, father, and mother; Tremor in her sister. There is no history of Colon cancer, Esophageal cancer, or Liver disease.   Allergies  Allergies  Allergen Reactions   Cephalosporins Rash   Keflex [Cephalexin] Rash     Home Medications  Prior to Admission medications   Medication Sig Start Date End Date Taking? Authorizing Provider  acetaminophen (TYLENOL) 500 MG tablet Take 500 mg by mouth every 6 (six) hours as needed for mild pain (pain score 1-3).   Yes [provider]  albuterol (VENTOLIN HFA) 108 (90 Base) MCG/ACT inhaler Inhale 2 puffs into the lungs every 6 (six) hours as needed for wheezing or shortness of breath. 05/22/22  Yes Cranford, Tonya, NP  apixaban (ELIQUIS) 5 MG TABS tablet TAKE 1 TABLET BY MOUTH TWICE  DAILY 06/07/23  Yes Branch, Joyceann No, MD  Ascorbic Acid (VITAMIN C) 500 MG CAPS Take 500 mg by mouth.    Yes [provider]  busPIRone (BUSPAR) 10 MG tablet TAKE 1/2 TO 1 TABLET BY MOUTH 3   TIMES DAILY AS NEEDED FOR  ANXIETY, MOOD OR IRRITABILITY 10/22/22  Yes Wilkinson, Dana E, FNP  CHOLECALCIFEROL PO Take 5,000 Units by mouth daily.   Yes [provider]  Cyanocobalamin 2500 MCG TABS Take 2,500 mcg by mouth once a week.   Yes [provider]  cyclobenzaprine (FLEXERIL) 10 MG tablet Take 1/2 to 1 tablet    2 to 3 x /day if needed for Muscle Spasms Patient taking differently: Take 5-10 mg by mouth 3 (three) times daily as needed for muscle spasms. Take 1/2 to 1 tablet    2 to 3 x /day if needed for Muscle Spasms 04/25/19  Yes Vangie Genet, MD  diltiazem (CARDIZEM CD) 300 MG 24 hr capsule TAKE 1 CAPSULE BY MOUTH DAILY 10/22/22  Yes Branch, Joyceann No, MD  docusate sodium (COLACE) 100 MG capsule Take 100 mg by mouth daily as needed for mild constipation.   Yes [provider]  escitalopram (LEXAPRO) 20 MG tablet TAKE 1 TABLET BY MOUTH DAILY FOR MOOD, ANXIETY &amp; IRRITABILITY 10/10/22  Yes Wilkinson, Dana E, FNP  famotidine (PEPCID) 40 MG tablet Take 1 tablet (40 mg total) by mouth at bedtime. 06/28/23  Yes Dorrell, Porfirio Bristol, MD  ferrous sulfate 325 (65 FE) MG tablet Take 1 tablet (325 mg total) by mouth every other day. 01/15/20  Yes Henderson Bureau, NP  Magnesium 250 MG TABS Take 1 tablet by mouth daily.   Yes [provider]  metoprolol tartrate (LOPRESSOR) 25 MG tablet TAKE 2 TABLETS BY MOUTH IN THE  MORNING AND 1 TABLET BY MOUTH IN THE EVENING Patient taking differently: Take 25-50 mg by mouth 2 (two) times daily. TAKE 2 TABLETS BY MOUTH IN THE  MORNING AND 1 TABLET BY MOUTH IN THE EVENING 05/05/23  Yes Branch, Joyceann No, MD  nitroGLYCERIN (NITROSTAT) 0.4 MG SL tablet Place 1 tablet (0.4 mg total) under the tongue every 5 (five) minutes as needed for chest pain. 02/05/20  Yes Strader, [place] M, PA-C  pantoprazole (PROTONIX) 40 MG tablet Take 1 tablet (40 mg total) by mouth 2 (two) times daily. Take  1 tablet  Daily  to prevent  Indigestion & Acid Reflux  06/28/23  Yes Dorrell, Porfirio Bristol, MD  Probiotic Product (PROBIOTIC DAILY PO) Take 1 tablet by mouth daily.   Yes [provider]  sucralfate (CARAFATE) 1 g tablet Take 2 tablets (2 g total) by mouth 4 (four) times daily -  with meals and at bedtime. 06/28/23  Yes Dorrell, Porfirio Bristol, MD  zinc gluconate 50 MG tablet Take 50 mg by mouth daily.   Yes  [provider]     Critical care time: 45 min      Arlyne Bering, MD [facility]

## 2023-07-01 NOTE — Telephone Encounter (Signed)
 I spoke with daughter who states patient is vented in the ICU at [facility], I deferred TCM questionnaire. I will message APP who sent request.

## 2023-07-01 NOTE — Op Note (Signed)
 [name] Procedure Date : 07/01/2023 MRN: [id number] Attending MD: Particia Lather , , [id number] Date of Birth: 1941/01/08 CSN: [id number] Age: 83 Admit Type: Inpatient Procedure:                Upper GI endoscopy Indications:              Coffee-ground emesis Providers:                Madelyn Brunner" Teena Dunk, Technician Referring MD:             ICU team Medicines:                Monitored Anesthesia Care (provided by ICU team) Complications:            No immediate complications. Estimated Blood Loss:     Estimated blood loss was minimal. Procedure:                Pre-Anesthesia Assessment:                           - Prior to the procedure, a History and Physical                            was performed, and patient medications and                            allergies were reviewed. The patient's tolerance of                            previous anesthesia was also reviewed. The risks                            and benefits of the procedure and the sedation                            options and risks were discussed with the patient.                            All questions were answered, and informed consent                            was obtained. Prior Anticoagulants: The patient has                            taken no anticoagulant or antiplatelet agents. ASA                            Grade Assessment: III - A patient with severe                            systemic disease. After reviewing the risks and                            benefits, the patient was deemed in satisfactory  condition to undergo the procedure.                           After obtaining informed consent, the endoscope was                            passed under direct vision. Throughout the                            procedure, the patient's blood pressure, pulse, and                            oxygen saturations were  monitored continuously. The                            [id number]-[id number] ([id number]) Olympus endoscope was introduced                            through the mouth, and advanced to the second part                            of duodenum. The upper GI endoscopy was                            accomplished without difficulty. The patient                            tolerated the procedure well. Scope In: Scope Out: Findings:      The examined esophagus was normal.      A large hiatal hernia with a oozing Cameron erosions was found.      A large amount of food (residue) and coffee ground material was found in       the hiatal hernia, which did limit visualization of the entire hernia.      NGT was found looped in the large hiatal hernia. This was removed during       the procedure.      Localized mild inflammation characterized by congestion (edema) and       erythema was found in the gastric antrum.      The examined duodenum was normal. Impression:               - Normal esophagus.                           - Large hiatal hernia with a oozing Cameron                            erosions.                           - A large amount of food (residue) and coffee                            ground material in the hiatal hernia portion of the  stomach.                           - NGT was found in the hiatal hernia. Removed.                           - Gastritis.                           - Normal examined duodenum.                           - No specimens collected. Recommendation:           - Continue ICU care.                           - It is suspected that the patient's coffee ground                            emesis is coming from [place]ley erosions in her                            hiatal hernia. The amount of food found in the                            patient's hiatal hernia significantly increases her                            risk for aspiration. Definitive treatment of  these                            issues will likely require a hiatal hernia repair,                            which is currently scheduled in 07/2023.                           - Cont PPI BID                           - Will start Reglan 10 mg TID as a promotility                            agent to try to help move the food that is located                            in her hiatal hernia.                           - NGT was removed during this procedure. For                            nutrition in the future, postpyloric tube feeding  with Dobhoff tube or TPN could be considered prior                            to hiatal hernia repair. Alternatively could                            consider another trial of PO intake while on Reglan                            therapy.                           - The findings and recommendations were discussed                            with the patient's family and the ICU team. Procedure Code(s):        --- Professional ---                           [phone], Esophagogastroduodenoscopy, flexible,                            transoral; diagnostic, including collection of                            specimen(s) by brushing or washing, when performed                            (separate procedure) Diagnosis Code(s):        --- Professional ---                           K44.9, Diaphragmatic hernia without obstruction or                            gangrene                           K25.9, Gastric ulcer, unspecified as acute or                            chronic, without hemorrhage or perforation                           T18.2XXA, Foreign body in stomach, initial encounter                           K29.70, Gastritis, unspecified, without bleeding                           K92.0, Hematemesis CPT copyright 2022 American Medical Association. All rights reserved. The codes documented in this report are preliminary and upon coder review may   be revised to meet current compliance requirements. Dr Particia Lather "Alan Ripper" Leonides Schanz,  07/01/2023 4:38:08 PM Number of Addenda: 0

## 2023-07-01 NOTE — TOC CM/SW Note (Signed)
 Transition of Care [facility]) - Inpatient Brief Assessment   Patient Details  Name: Darlene [name] MRN: [id number] Date of Birth: January 05, 1941  Transition of Care [facility]) CM/SW Contact:    Tom-Johnson, Angelique Ken, RN Phone Number: 07/01/2023, 1:20 PM   Clinical Narrative:  Patient presented to the ED with Nausea, Coffee color emesis and Diarrhea.  Admitted with GI Bleed, GI following. On IV abx for Sepsis 2/2 Pneumonia. Has hx of A-Fib, on Diltiazem.  Patient was scheduled for EGD yesterday 06/30/23 and went into Afib/RVR and progressive Respiratory distress. Patient was intubated and transferred to ICU. Patient currently intubated and sedated, plan for EGD today.    Patient not Medically ready for discharge.  CM will continue to follow as patient progresses with care towards discharge.        Transition of Care Asessment:

## 2023-07-01 NOTE — Telephone Encounter (Signed)
   Transition of Care Follow-up Phone Call Request    Patient Name: AMILAH GREENSPAN Date of Birth: 05-Mar-1941 Date of Encounter: 07/01/2023  Primary Care Provider:  Jenelle Mis, FNP Primary Cardiologist:  Armida Lander, MD  Gerda Knows has been scheduled for a transition of care follow up appointment with a [facility] provider:  Can you please call patient and help arrange hospital follow-up?  Please reach out to Gerda Knows within 48 hours of discharge to confirm appointment and review transition of care protocol questionnaire. Anticipated discharge date: Uncertain, cardiology signing off  Burnetta Cart, PA-C  07/01/2023, 11:44 AM

## 2023-07-02 ENCOUNTER — Inpatient Hospital Stay (HOSPITAL_COMMUNITY)

## 2023-07-02 DIAGNOSIS — J9601 Acute respiratory failure with hypoxia: Secondary | ICD-10-CM | POA: Diagnosis not present

## 2023-07-02 DIAGNOSIS — K922 Gastrointestinal hemorrhage, unspecified: Secondary | ICD-10-CM | POA: Diagnosis not present

## 2023-07-02 DIAGNOSIS — G4731 Primary central sleep apnea: Secondary | ICD-10-CM

## 2023-07-02 DIAGNOSIS — J9602 Acute respiratory failure with hypercapnia: Secondary | ICD-10-CM | POA: Diagnosis not present

## 2023-07-02 DIAGNOSIS — K219 Gastro-esophageal reflux disease without esophagitis: Secondary | ICD-10-CM

## 2023-07-02 DIAGNOSIS — E876 Hypokalemia: Secondary | ICD-10-CM

## 2023-07-02 LAB — GLUCOSE, CAPILLARY
Glucose-Capillary: 129 mg/dL — ABNORMAL HIGH (ref 70–99)
Glucose-Capillary: 133 mg/dL — ABNORMAL HIGH (ref 70–99)
Glucose-Capillary: 136 mg/dL — ABNORMAL HIGH (ref 70–99)
Glucose-Capillary: 139 mg/dL — ABNORMAL HIGH (ref 70–99)
Glucose-Capillary: 144 mg/dL — ABNORMAL HIGH (ref 70–99)
Glucose-Capillary: 144 mg/dL — ABNORMAL HIGH (ref 70–99)
Glucose-Capillary: 161 mg/dL — ABNORMAL HIGH (ref 70–99)

## 2023-07-02 LAB — BASIC METABOLIC PANEL WITH GFR
Anion gap: 8 (ref 5–15)
Anion gap: 5 (ref 5–15)
BUN: 23 mg/dL (ref 8–23)
BUN: 22 mg/dL (ref 8–23)
CO2: 29 mmol/L (ref 22–32)
CO2: 26 mmol/L (ref 22–32)
Calcium: 8.7 mg/dL — ABNORMAL LOW (ref 8.9–10.3)
Calcium: 8.4 mg/dL — ABNORMAL LOW (ref 8.9–10.3)
Chloride: 106 mmol/L (ref 98–111)
Chloride: 109 mmol/L (ref 98–111)
Creatinine, Ser: 0.74 mg/dL (ref 0.44–1.00)
Creatinine, Ser: 0.69 mg/dL (ref 0.44–1.00)
GFR, Estimated: >60 mL/min (ref >60)
GFR, Estimated: >60 mL/min (ref >60)
Glucose, Bld: 127 mg/dL — ABNORMAL HIGH (ref 70–99)
Glucose, Bld: 148 mg/dL — ABNORMAL HIGH (ref 70–99)
Potassium: 2.9 mmol/L — ABNORMAL LOW (ref 3.5–5.1)
Potassium: >7.5 mmol/L (ref 3.5–5.1)
Sodium: 143 mmol/L (ref 135–145)
Sodium: 140 mmol/L (ref 135–145)

## 2023-07-02 LAB — CBC
HCT: 30.9 % — ABNORMAL LOW (ref 36.0–46.0)
Hemoglobin: 10.6 g/dL — ABNORMAL LOW (ref 12.0–15.0)
MCH: 32.9 pg (ref 26.0–34.0)
MCHC: 34.3 g/dL (ref 30.0–36.0)
MCV: 96 fL (ref 80.0–100.0)
Platelets: 115 10*3/uL — ABNORMAL LOW (ref 150–400)
RBC: 3.22 MIL/uL — ABNORMAL LOW (ref 3.87–5.11)
RDW: 13.3 % (ref 11.5–15.5)
WBC: 7.8 10*3/uL (ref 4.0–10.5)
nRBC: 0 % (ref 0.0–0.2)

## 2023-07-02 LAB — PHOSPHORUS: Phosphorus: 2.8 mg/dL (ref 2.5–4.6)

## 2023-07-02 LAB — CULTURE, RESPIRATORY W GRAM STAIN: Culture: NORMAL

## 2023-07-02 LAB — MAGNESIUM: Magnesium: 2.1 mg/dL (ref 1.7–2.4)

## 2023-07-02 LAB — CALCIUM, IONIZED: Calcium, Ionized, Serum: 5.3 mg/dL (ref 4.5–5.6)

## 2023-07-02 LAB — HEPARIN LEVEL (UNFRACTIONATED): Heparin Unfractionated: 0.39 [IU]/mL (ref 0.30–0.70)

## 2023-07-02 LAB — POTASSIUM: Potassium: 3 mmol/L — ABNORMAL LOW (ref 3.5–5.1)

## 2023-07-02 LAB — APTT: aPTT: 81 s — ABNORMAL HIGH (ref 24–36)

## 2023-07-02 MED ORDER — POLYETHYLENE GLYCOL 3350 17 G PO PACK
17.0000 g | PACK | Freq: Every day | ORAL | Status: DC
  Administered 2023-07-03 – 2023-07-24 (×12): 17 g
  Filled 2023-07-02 (×16): qty 1

## 2023-07-02 MED ORDER — VITAL 1.5 CAL PO LIQD
1000.0000 mL | ORAL | Status: DC
  Administered 2023-07-02: 1000 mL

## 2023-07-02 MED ORDER — POTASSIUM CHLORIDE 10 MEQ/50ML IV SOLN
10.0000 meq | INTRAVENOUS | Status: AC
  Administered 2023-07-02 (×5): 10 meq via INTRAVENOUS
  Filled 2023-07-02 (×6): qty 50

## 2023-07-02 MED ORDER — HEPARIN (PORCINE) 25000 UT/250ML-% IV SOLN
1300.0000 [IU]/h | INTRAVENOUS | Status: DC
  Administered 2023-07-02 – 2023-07-05 (×5): 1300 [IU]/h via INTRAVENOUS
  Filled 2023-07-02 (×5): qty 250

## 2023-07-02 MED ORDER — ESCITALOPRAM OXALATE 10 MG PO TABS
20.0000 mg | ORAL_TABLET | Freq: Every day | ORAL | Status: DC
  Administered 2023-07-03 – 2023-07-29 (×26): 20 mg
  Filled 2023-07-02 (×25): qty 2

## 2023-07-02 MED ORDER — DOCUSATE SODIUM 50 MG/5ML PO LIQD
100.0000 mg | Freq: Two times a day (BID) | ORAL | Status: DC
  Administered 2023-07-02 – 2023-07-24 (×31): 100 mg
  Filled 2023-07-02 (×38): qty 10

## 2023-07-02 MED ORDER — METOCLOPRAMIDE HCL 5 MG/ML IJ SOLN
10.0000 mg | Freq: Once | INTRAMUSCULAR | Status: AC
  Administered 2023-07-02: 10 mg via INTRAVENOUS
  Filled 2023-07-02: qty 2

## 2023-07-02 MED ORDER — POTASSIUM CHLORIDE 10 MEQ/50ML IV SOLN
10.0000 meq | Freq: Once | INTRAVENOUS | Status: AC
  Administered 2023-07-02: 10 meq via INTRAVENOUS

## 2023-07-02 MED ORDER — POTASSIUM CHLORIDE 10 MEQ/50ML IV SOLN
10.0000 meq | INTRAVENOUS | Status: AC
  Administered 2023-07-02 – 2023-07-03 (×6): 10 meq via INTRAVENOUS
  Filled 2023-07-02 (×6): qty 50

## 2023-07-02 NOTE — Progress Notes (Addendum)
 eLink Physician-Brief Progress Note Patient Name: Darlene [name] DOB: [birth year] MRN: [id number]   Date of Service  07/02/2023  HPI/Events of Note  Potassium 7.5.  Up from 2.9.  eICU Interventions  Redraw   2245 -K3.0, continue to replace  Intervention Category Minor Interventions: Routine modifications to care plan (e.g. PRN medications for pain, fever)  [name] 07/02/2023, 9:31 PM

## 2023-07-02 NOTE — Progress Notes (Signed)
 PHARMACY - ANTICOAGULATION CONSULT NOTE  Pharmacy Consult for Heparin  Indication: atrial fibrillation  Allergies  Allergen Reactions   Cephalosporins Rash   Keflex [Cephalexin] Rash   Patient Measurements: Height: 5\' 10"  (177.8 cm) Weight: 95.7 kg (210 lb 15.7 oz) IBW/kg (Calculated) : 68.5 HEPARIN  DW (KG): 91  Vital Signs: Temp: 97.5 F (36.4 C) (04/18 1133) Temp Source: Oral (04/18 1133) BP: 125/79 (04/18 0715) Pulse Rate: 79 (04/18 0715)  Labs: Recent Labs    06/30/23 1610 06/30/23 1227 06/30/23 1617 07/01/23 0626 07/01/23 0754 07/01/23 0948 07/01/23 1715 07/01/23 1951 07/02/23 0431  HGB 13.2   < > 12.9 11.0*  --    < > 10.5* 10.6* 10.6*  HCT 39.3   < > 38.8 31.4*  --    < > 30.7* 31.0* 30.9*  PLT 200  --  207 127*  --   --   --   --  115*  APTT 34  --   --   --   --   --   --   --   --   LABPROT 15.9*  --   --   --   --   --   --   --   --   INR 1.3*  --   --   --   --   --   --   --   --   CREATININE 0.83  --  0.97 0.98  --   --   --   --  0.74  TROPONINIHS  --   --  23*  --  17  --   --   --   --    < > = values in this interval not displayed.    Estimated Creatinine Clearance: 66.8 mL/min (by C-G formula based on SCr of 0.74 mg/dL).   Medical History: Past Medical History:  Diagnosis Date   Anxiety    Chronic lower back pain    Depression    DJD (degenerative joint disease)    knees, neck   GERD (gastroesophageal reflux disease)    has had dilitation in the past   Hypertension     Medications:  Scheduled:   Chlorhexidine  Gluconate Cloth  6 each Topical Q0600   docusate  100 mg Per Tube BID   [START ON 07/03/2023] escitalopram   20 mg Per Tube Daily   fentaNYL  (SUBLIMAZE ) injection  25 mcg Intravenous Once   insulin  aspart  0-9 Units Subcutaneous Q4H   ipratropium-albuterol   3 mL Nebulization QID   metoCLOPramide  (REGLAN ) injection  10 mg Intravenous Q8H   mouth rinse  15 mL Mouth Rinse Q2H   pantoprazole  (PROTONIX ) IV  40 mg Intravenous  Q12H   [START ON 07/03/2023] polyethylene glycol  17 g Per Tube Daily   Infusions:   dexmedetomidine  (PRECEDEX ) IV infusion 1 mcg/kg/hr (07/02/23 1358)   fentaNYL  infusion INTRAVENOUS 200 mcg/hr (07/02/23 1000)   piperacillin -tazobactam (ZOSYN )  IV 3.375 g (07/02/23 1401)   potassium chloride  10 mEq (07/02/23 1357)   Assessment: 82YOF with PMH of atrial fibrillation on Eliquis  who presented with coffee ground emesis. EGD revealed large hiatal hernia with a oozing Cameron erosions. Okay to resume anticoagulation with heparin  gtt. Last Eliquis  dose was on 4/13 PTA.  Hgb 10.6, plt lower 100s. Will avoid boluses and aim for lower end of goal due to recent bleed. Plan to obtain aPTT with first heparin  level to ensure correlation.  Goal of Therapy:  Heparin  level 0.3-0.5 units/ml Monitor platelets  by anticoagulation protocol: Yes   Plan:  Start heparin  infusion at 1300 units/hr Check aPTT and heparin  level in 8 hours Daily heparin  level Continue to monitor H&H and platelets F/u plans to transition back to Eliquis   Abelina Abide, PharmD PGY1 Pharmacy Resident 07/02/2023 2:08 PM

## 2023-07-02 NOTE — Progress Notes (Signed)
 Pharmacy Electrolyte Replacement  Recent Labs:  Recent Labs    07/02/23 0431  K 2.9*  MG 2.1  PHOS 2.8  CREATININE 0.74    Low Critical Values (K </= 2.5, Phos </= 1, Mg </= 1) Present: K = 2.9  MD Contacted: Deneise Finlay, NP  Plan: KCL x6  Ivery Marking, PharmD, BCIDP, AAHIVP, CPP Infectious Disease Pharmacist 07/02/2023 8:20 AM

## 2023-07-02 NOTE — Progress Notes (Signed)
 Afternoon rounds  Verbally discussed plan for anticoagulation with GI who signed off on anticoagulation Risk/benefits discussed with patient's daughter Adah Acron with plan made to start heparin  drip with no bolus for anticoagulation given A-fib history.  Additionally general surgery was made aware of patient's admission.   Avyaan Summer D. Harris, NP-C [facility] Personal contact information can be found on Amion  If no contact or response made please call [phone] 07/02/2023, 3:33 PM

## 2023-07-02 NOTE — Progress Notes (Signed)
 NAME:  Darlene [name], MRN:  [id number], DOB:  1940/08/11, LOS: 2 ADMISSION DATE:  06/29/2023, CONSULTATION DATE:  06/30/2023 REFERRING MD: TRH, CHIEF COMPLAINT: afib/flutter RVR    History of Present Illness:  Darlene [name] is a 83 y.o. F with PMH significant for atrial fibrillation on eliquis , OSA on CPAP, HTN, HL, GERD and esophageal stricture who presented to the ED 4/15 with coffee ground emesis.  This initially began about one week before discharge and she was supposed to follow up for a scope outpatient for this, but was told this would need to be done inpatient.  CTA abd/pelvis did not reveal any acute source of bleeding, did show high-grade stenosis involving celiac access with collateral flow from the SMA.  Patient is already scheduled for hiatal hernia and spigelian hernia repair with Dr. Jamse Mcgee in 07/2023.  Pt has not required any transfusions    She was admitted and the plan was to head to EGD and then be discharged, however she has been NPO and her rate controlling agents were held and pt developed Afib with RVR and hypoxia/hypercarbia.  She was placed on bipap and given her recent emesis PCCM was consulted.  Family agreed to intubation and pt started on a cardizem  gtt   Pertinent  Medical History  Anxiety, Chronic lower back pain, Depression, DJD (degenerative joint disease), GERD (gastroesophageal reflux disease), and Hypertension.   Significant Hospital Events: Including procedures, antibiotic start and stop dates in addition to other pertinent events   4/15 admit with coffee ground emesis for inpatient EGD 4/16 afib with RVR, concern for pulm edema, PCCM consult and ICU txfr 4/17: ETT air leak, fixed with more balloon inflation. On Precedex  and Fentanyl  drips for sedation. Controlled Afib 4/18  Interim History / Subjective:    Objective   Blood pressure 125/79, pulse 79, temperature (!) 97.5 F (36.4 C), temperature source Oral, resp. rate 20, height 5\' 10"  (1.778 m), weight  95.7 kg, SpO2 97%.    Vent Mode: PRVC FiO2 (%):  [60 %-70 %] 60 % Set Rate:  [20 bmp] 20 bmp Vt Set:  [500 mL] 500 mL PEEP:  [10 cmH20] 10 cmH20 Plateau Pressure:  [21 cmH20-23 cmH20] 21 cmH20   Intake/Output Summary (Last 24 hours) at 07/02/2023 0840 Last data filed at 07/02/2023 0700 Gross per 24 hour  Intake 1511.23 ml  Output 450 ml  Net 1061.23 ml   Filed Weights   06/29/23 1202 06/30/23 1316  Weight: 103.7 kg 95.7 kg    Examination: General: sedated, intubated.   HENT: PERL. No LNE or thyromegaly. No JVD Lungs: symmetrical air entry bilaterally. No crackles or wheezing Cardiovascular: NL S1/S2. No m/g/r Abdomen: no distension or tenderness Extremities: trace edema. Symmetrical  Neuro: sedated    Resolved Hospital Problem list   Lactic acidosis  Assessment & Plan:  Acute hypoxic and hypercapnic respiratory failure Concern for aspiration during intubation -CTA chest negative for PE but reveals dense consolidation in bilateral lower lobes with noted large volume vomitus.  Peri-intubation  Central sleep apnea P: Continue ventilator support with lung protective strategies  Wean PEEP and FiO2 for sats greater than 90%. Head of bed elevated 30 degrees. Plateau pressures less than 30 cm H20.  Follow intermittent chest x-ray and ABG.   SAT/SBT as tolerated, mentation preclude extubation  Ensure adequate pulmonary hygiene  Follow cultures  VAP bundle in place  PAD protocol Continue empiric Unasyn Aspiration precautions   Upper GI bleed with coffee-ground emesis in the setting  of large hiatal hernia and esophageal stricture -Upper EGD 4/17 revealed large hiatal hernia with a oozing Cameron erosions.. Large amount of food (residue) and coffee ground material was observed in the hiatal hernia portion of the stomach.  -Tentative plan for surgical correction of hiatal hernia with Dr. Lanell Pinta 08/03/2023 P: GI started Reglan  Will place postpyloric core track tube  today Consult/inform general surgery of patient's admission Aspiration precautions continue PPI Discussed with GI ability to anticoagulation  Persistent Atrial Fibrillation/RVR  -On Ditiliazem, Lopressor , and Eliquis  at baseline  - Echo 4/18 with EF of 50 to 55% with global hypokinesis with akinesis of the RV free wall positive for McConnell sign Essential hypertension Hyperlipidemia P: Management per cardiology  Continuous telemetry  Optmize electrolytes   GERD P: PPI  Hypokalemia  P: Supplement  Trend Bmet    Best Practice (right click and "Reselect all SmartList Selections" daily)   Diet/type: NPO w/ oral meds DVT prophylaxis SCD Pressure ulcer(s): N/A GI prophylaxis: PPI Lines: Central line Foley:  Yes, and it is still needed Code Status:  full code Last date of multidisciplinary goals of care discussion []   Critical care time:  CRITICAL CARE Performed by: Malie Kashani D. Harris   Total critical care time: 42 minutes  Critical care time was exclusive of separately billable procedures and treating other patients.  Critical care was necessary to treat or prevent imminent or life-threatening deterioration.  Critical care was time spent personally by me on the following activities: development of treatment plan with patient and/or surrogate as well as nursing, discussions with consultants, evaluation of patient's response to treatment, examination of patient, obtaining history from patient or surrogate, ordering and performing treatments and interventions, ordering and review of laboratory studies, ordering and review of radiographic studies, pulse oximetry and re-evaluation of patient's condition.  Yu Peggs D. Harris, NP-C [facility] Personal contact information can be found on Amion  If no contact or response made please call [phone] 07/02/2023, 8:58 AM

## 2023-07-02 NOTE — Progress Notes (Signed)
 Initial Nutrition Assessment  DOCUMENTATION CODES:  Not applicable  INTERVENTION:  Initiate trickle tube feeding: Osmolite 1.5 at 20ml/hr   Once tolerance established and able to advance, recommend: Advancing Osmolite 1.5 by 10ml q10h to a goal rate of 78ml/hr ( per day) 60ml ProSource TF20 once daily Provides 1800 kcal, 75g protein and free water  daily  Monitor magnesium  and phosphorus once daily x 6 occurrences, MD to replete as needed, as pt is at risk for refeeding syndrome given significant decline in PO intake x 1 month.  Thiamine 100mg  daily x7 days  NUTRITION DIAGNOSIS:  Inadequate oral intake related to altered GI function (large hiatal hernia; cameron erosions) as evidenced by NPO status  GOAL:  Patient will meet greater than or equal to 90% of their needs  MONITOR:  Vent status, Labs, Weight trends, TF tolerance  REASON FOR ASSESSMENT:  Ventilator, Consult Enteral/tube feeding initiation and management  ASSESSMENT:  Pt admitted with coffee ground emesis. PMH significant for afib on eliquis , OSA, HTN, HLD, GERD and esophageal stricture.   4/15: admit with coffee ground emesis 4/16: developed afib w/RVR, concern for pulmonary edema, transfer to ICU; intubated 4/17: EGD- large hiatal hernia with oozing cameron erosions; large residue and coffee ground material in hiatal hernia  Patient is currently intubated on ventilator support MV: 10 L/min Temp (24hrs), Avg:97.7 F (36.5 C), Min:97.4 F (36.3 C), Max:98.4 F (36.9 C)  Spoke with pt's daughter at bedside. Cortrak also being placed at time of visit.  Pt's daughter reports that pt lives with her. She usually prepares all of her meals and pt enjoys eating anything all times of day. About 1 month ago is when she began to have a rapid decline in her ability to tolerate PO intake. Initially she was able to consume small bites at a time which progressed into only tolerating spoonfuls, to intolerance of any  PO intake with multiple episodes of emesis daily.  Pt was scheduled for hiatal hernia and spigelian hernia repair in May. Surgery consulted/updated on pt's admission. PT's daughter hopeful she will be well enough to have procedure done while inpatient.   Pt's daughter reports that her last measured weight was about 232 lbs. She denies any recent significant weight loss or changes. At baseline she is independent with ADLs and uses a walker for mobility.   No updated weight on file since 4/16. Last documented to measure 95.7 kg. Review of documented weight history within the last year reflects pt's weight to fluctuate between 98-103 kg. Pt's noted to have had a weight loss of 7.6% within the last 2 weeks which is clinically significant for time frame if weights are accurate.   Drains/lines: CVC Cortrak UOP: x24 hours  Medications: colace BID, SSI 0-9 units q4h, reglan , protonix , miralax  Drips: Abx Potassium chloride   Labs:  Potassium 2.9 CBG's 122-149 x24 hours  NUTRITION - FOCUSED PHYSICAL EXAM: Flowsheet Row Most Recent Value  Orbital Region Mild depletion  Upper Arm Region No depletion  Thoracic and Lumbar Region No depletion  Buccal Region Unable to assess  Temple Region Mild depletion  Clavicle Bone Region No depletion  Clavicle and Acromion Bone Region Mild depletion  Scapular Bone Region No depletion  Dorsal Hand Unable to assess  [mild non-pitting edema]  Patellar Region No depletion  Anterior Thigh Region No depletion  Posterior Calf Region No depletion  Edema (RD Assessment) Mild  [BUE, generalized]  Hair Reviewed  Eyes Unable to assess  Mouth Unable to assess  Skin Reviewed  Nails Reviewed   Diet Order:   Diet Order             Diet NPO time specified  Diet effective now                   EDUCATION NEEDS:   Education needs have been addressed  Skin:  Skin Assessment: Reviewed RN Assessment  Last BM:  PTA/unknown  Height:   Ht Readings  from Last 1 Encounters:  06/29/23 5' 10 (1.778 m)    Weight:   Wt Readings from Last 1 Encounters:  06/30/23 95.7 kg    Ideal Body Weight:  68.2 kg  BMI:  Body mass index is 30.27 kg/m.  Estimated Nutritional Needs:   Kcal:  1700-1900  Protein:  85-100g  Fluid:  >/=1.7L  Royce Maris, RDN, LDN Clinical Nutrition See AMiON for contact information.

## 2023-07-02 NOTE — Procedures (Signed)
 Cortrak  Tube Type:  Cortrak - 55 inches Tube Location:  Left nare Initial Placement:  Stomach Secured by: Bridle Technique Used to Measure Tube Placement:  Marking at nare/corner of mouth Cortrak Secured At:  79 cm   Cortrak Tube Team Note:  Consult received to place a Cortrak feeding tube.   No x-ray is required. RN may begin using tube.   If the tube becomes dislodged please keep the tube and contact the Cortrak team at [email or web address] for replacement.  If after hours and replacement cannot be delayed, place a NG tube and confirm placement with an abdominal x-ray.    Torrance Freestone MS, RD, LDN If unable to be reached, please send secure chat to "RD inpatient" available from 8:00a-4:00p daily

## 2023-07-03 DIAGNOSIS — G4731 Primary central sleep apnea: Secondary | ICD-10-CM | POA: Diagnosis not present

## 2023-07-03 DIAGNOSIS — J9602 Acute respiratory failure with hypercapnia: Secondary | ICD-10-CM | POA: Diagnosis not present

## 2023-07-03 DIAGNOSIS — K922 Gastrointestinal hemorrhage, unspecified: Secondary | ICD-10-CM | POA: Diagnosis not present

## 2023-07-03 DIAGNOSIS — J9601 Acute respiratory failure with hypoxia: Secondary | ICD-10-CM | POA: Diagnosis not present

## 2023-07-03 LAB — BASIC METABOLIC PANEL WITH GFR
Anion gap: 12 (ref 5–15)
Anion gap: 8 (ref 5–15)
BUN: 23 mg/dL (ref 8–23)
BUN: 22 mg/dL (ref 8–23)
CO2: 28 mmol/L (ref 22–32)
CO2: 28 mmol/L (ref 22–32)
Calcium: 8.7 mg/dL — ABNORMAL LOW (ref 8.9–10.3)
Calcium: 9.3 mg/dL (ref 8.9–10.3)
Chloride: 104 mmol/L (ref 98–111)
Chloride: 110 mmol/L (ref 98–111)
Creatinine, Ser: 0.85 mg/dL (ref 0.44–1.00)
Creatinine, Ser: 0.92 mg/dL (ref 0.44–1.00)
GFR, Estimated: >60 mL/min (ref >60)
GFR, Estimated: >60 mL/min (ref >60)
Glucose, Bld: 162 mg/dL — ABNORMAL HIGH (ref 70–99)
Glucose, Bld: 152 mg/dL — ABNORMAL HIGH (ref 70–99)
Potassium: 3.4 mmol/L — ABNORMAL LOW (ref 3.5–5.1)
Potassium: 4.3 mmol/L (ref 3.5–5.1)
Sodium: 144 mmol/L (ref 135–145)
Sodium: 146 mmol/L — ABNORMAL HIGH (ref 135–145)

## 2023-07-03 LAB — CULTURE, BLOOD (ROUTINE X 2)
Culture: NO GROWTH
Culture: NO GROWTH

## 2023-07-03 LAB — MAGNESIUM: Magnesium: 2.1 mg/dL (ref 1.7–2.4)

## 2023-07-03 LAB — CBC
HCT: 31.7 % — ABNORMAL LOW (ref 36.0–46.0)
Hemoglobin: 10.6 g/dL — ABNORMAL LOW (ref 12.0–15.0)
MCH: 32.6 pg (ref 26.0–34.0)
MCHC: 33.4 g/dL (ref 30.0–36.0)
MCV: 97.5 fL (ref 80.0–100.0)
Platelets: 135 10*3/uL — ABNORMAL LOW (ref 150–400)
RBC: 3.25 MIL/uL — ABNORMAL LOW (ref 3.87–5.11)
RDW: 13.6 % (ref 11.5–15.5)
WBC: 11.2 10*3/uL — ABNORMAL HIGH (ref 4.0–10.5)
nRBC: 0 % (ref 0.0–0.2)

## 2023-07-03 LAB — HEPARIN LEVEL (UNFRACTIONATED): Heparin Unfractionated: 0.49 [IU]/mL (ref 0.30–0.70)

## 2023-07-03 LAB — GLUCOSE, CAPILLARY
Glucose-Capillary: 156 mg/dL — ABNORMAL HIGH (ref 70–99)
Glucose-Capillary: 149 mg/dL — ABNORMAL HIGH (ref 70–99)
Glucose-Capillary: 164 mg/dL — ABNORMAL HIGH (ref 70–99)
Glucose-Capillary: 152 mg/dL — ABNORMAL HIGH (ref 70–99)
Glucose-Capillary: 139 mg/dL — ABNORMAL HIGH (ref 70–99)
Glucose-Capillary: 151 mg/dL — ABNORMAL HIGH (ref 70–99)

## 2023-07-03 LAB — PHOSPHORUS: Phosphorus: 2.6 mg/dL (ref 2.5–4.6)

## 2023-07-03 MED ORDER — METHYLNALTREXONE BROMIDE 12 MG/0.6ML ~~LOC~~ SOLN
12.0000 mg | Freq: Once | SUBCUTANEOUS | Status: AC
  Administered 2023-07-03: 12 mg via SUBCUTANEOUS
  Filled 2023-07-03: qty 0.6

## 2023-07-03 MED ORDER — FUROSEMIDE 10 MG/ML IJ SOLN
40.0000 mg | Freq: Four times a day (QID) | INTRAMUSCULAR | Status: AC
  Administered 2023-07-03 (×2): 40 mg via INTRAVENOUS
  Filled 2023-07-03 (×2): qty 4

## 2023-07-03 MED ORDER — BISACODYL 10 MG RE SUPP
10.0000 mg | Freq: Every day | RECTAL | Status: DC
  Administered 2023-07-03 – 2023-07-07 (×4): 10 mg via RECTAL
  Filled 2023-07-03 (×4): qty 1

## 2023-07-03 MED ORDER — PROPOFOL 1000 MG/100ML IV EMUL
INTRAVENOUS | Status: AC
  Administered 2023-07-03: 5 ug/kg/min via INTRAVENOUS
  Filled 2023-07-03: qty 100

## 2023-07-03 MED ORDER — VITAL 1.5 CAL PO LIQD
1000.0000 mL | ORAL | Status: DC
  Administered 2023-07-04: 1000 mL
  Filled 2023-07-03: qty 1000

## 2023-07-03 MED ORDER — FUROSEMIDE 10 MG/ML IJ SOLN
80.0000 mg | Freq: Four times a day (QID) | INTRAMUSCULAR | Status: DC
  Administered 2023-07-03: 80 mg via INTRAVENOUS
  Filled 2023-07-03 (×2): qty 8

## 2023-07-03 MED ORDER — POTASSIUM CHLORIDE 20 MEQ PO PACK
40.0000 meq | PACK | Freq: Once | ORAL | Status: AC
  Administered 2023-07-03: 40 meq
  Filled 2023-07-03: qty 2

## 2023-07-03 MED ORDER — NOREPINEPHRINE 4 MG/250ML-% IV SOLN
0.0000 ug/min | INTRAVENOUS | Status: DC
  Administered 2023-07-03: 2 ug/min via INTRAVENOUS
  Administered 2023-07-04: 6 ug/min via INTRAVENOUS
  Filled 2023-07-03 (×2): qty 250

## 2023-07-03 MED ORDER — FUROSEMIDE 10 MG/ML IJ SOLN
40.0000 mg | Freq: Once | INTRAMUSCULAR | Status: AC
  Administered 2023-07-03: 40 mg via INTRAVENOUS
  Filled 2023-07-03: qty 4

## 2023-07-03 MED ORDER — ALBUMIN HUMAN 25 % IV SOLN
25.0000 g | Freq: Four times a day (QID) | INTRAVENOUS | Status: AC
  Administered 2023-07-03 – 2023-07-04 (×4): 25 g via INTRAVENOUS
  Filled 2023-07-03 (×4): qty 100

## 2023-07-03 MED ORDER — PROPOFOL 1000 MG/100ML IV EMUL
5.0000 ug/kg/min | INTRAVENOUS | Status: DC
  Administered 2023-07-03: 20 ug/kg/min via INTRAVENOUS
  Administered 2023-07-03: 50 ug/kg/min via INTRAVENOUS
  Administered 2023-07-04: 45 ug/kg/min via INTRAVENOUS
  Filled 2023-07-03 (×4): qty 100

## 2023-07-03 NOTE — Plan of Care (Signed)
  Problem: Education: Goal: Knowledge of General Education information will improve Description: Including pain rating scale, medication(s)/side effects and non-pharmacologic comfort measures Outcome: Progressing   Problem: Health Behavior/Discharge Planning: Goal: Ability to manage health-related needs will improve Outcome: Progressing   Problem: Clinical Measurements: Goal: Ability to maintain clinical measurements within normal limits will improve Outcome: Progressing Goal: Will remain free from infection Outcome: Progressing Goal: Diagnostic test results will improve Outcome: Progressing Goal: Respiratory complications will improve Outcome: Progressing Goal: Cardiovascular complication will be avoided Outcome: Progressing   Problem: Activity: Goal: Risk for activity intolerance will decrease Outcome: Progressing   Problem: Nutrition: Goal: Adequate nutrition will be maintained Outcome: Progressing   Problem: Coping: Goal: Level of anxiety will decrease Outcome: Progressing   Problem: Elimination: Goal: Will not experience complications related to bowel motility Outcome: Progressing Goal: Will not experience complications related to urinary retention Outcome: Progressing   Problem: Pain Managment: Goal: General experience of comfort will improve and/or be controlled Outcome: Progressing   Problem: Safety: Goal: Ability to remain free from injury will improve Outcome: Progressing   Problem: Skin Integrity: Goal: Risk for impaired skin integrity will decrease Outcome: Progressing   Problem: Education: Goal: Knowledge of disease or condition will improve Outcome: Progressing Goal: Understanding of medication regimen will improve Outcome: Progressing Goal: Individualized Educational Video(s) Outcome: Progressing   Problem: Activity: Goal: Ability to tolerate increased activity will improve Outcome: Progressing   Problem: Cardiac: Goal: Ability to achieve  and maintain adequate cardiopulmonary perfusion will improve Outcome: Progressing   Problem: Health Behavior/Discharge Planning: Goal: Ability to safely manage health-related needs after discharge will improve Outcome: Progressing   Problem: Activity: Goal: Ability to tolerate increased activity will improve Outcome: Progressing   Problem: Respiratory: Goal: Ability to maintain a clear airway and adequate ventilation will improve Outcome: Progressing   Problem: Role Relationship: Goal: Method of communication will improve Outcome: Progressing   Problem: Education: Goal: Ability to describe self-care measures that may prevent or decrease complications (Diabetes Survival Skills Education) will improve Outcome: Progressing Goal: Individualized Educational Video(s) Outcome: Progressing   Problem: Coping: Goal: Ability to adjust to condition or change in health will improve Outcome: Progressing   Problem: Fluid Volume: Goal: Ability to maintain a balanced intake and output will improve Outcome: Progressing   Problem: Health Behavior/Discharge Planning: Goal: Ability to identify and utilize available resources and services will improve Outcome: Progressing Goal: Ability to manage health-related needs will improve Outcome: Progressing   Problem: Metabolic: Goal: Ability to maintain appropriate glucose levels will improve Outcome: Progressing   Problem: Nutritional: Goal: Maintenance of adequate nutrition will improve Outcome: Progressing Goal: Progress toward achieving an optimal weight will improve Outcome: Progressing   Problem: Skin Integrity: Goal: Risk for impaired skin integrity will decrease Outcome: Progressing   Problem: Tissue Perfusion: Goal: Adequacy of tissue perfusion will improve Outcome: Progressing

## 2023-07-03 NOTE — Progress Notes (Signed)
 NAME:  Darlene [name], MRN:  [id number], DOB:  [birth year], LOS: 3 ADMISSION DATE:  06/29/2023, CONSULTATION DATE:  06/30/2023 REFERRING MD: TRH, CHIEF COMPLAINT: afib/flutter RVR    History of Present Illness:  Darlene [name] is a 83 y.o. F with PMH significant for atrial fibrillation on eliquis , OSA on CPAP, HTN, HL, GERD and esophageal stricture who presented to the ED 4/15 with coffee ground emesis.  This initially began about one week before discharge and she was supposed to follow up for a scope outpatient for this, but was told this would need to be done inpatient.  CTA abd/pelvis did not reveal any acute source of bleeding, did show high-grade stenosis involving celiac access with collateral flow from the SMA.  Patient is already scheduled for hiatal hernia and spigelian hernia repair with Dr. Jamse Mcgee in 07/2023.  Pt has not required any transfusions    She was admitted and the plan was to head to EGD and then be discharged, however she has been NPO and her rate controlling agents were held and pt developed Afib with RVR and hypoxia/hypercarbia.  She was placed on bipap and given her recent emesis PCCM was consulted.  Family agreed to intubation and pt started on a cardizem  gtt   Pertinent  Medical History  Anxiety, Chronic lower back pain, Depression, DJD (degenerative joint disease), GERD (gastroesophageal reflux disease), and Hypertension.   Significant Hospital Events: Including procedures, antibiotic start and stop dates in addition to other pertinent events   4/15 admit with coffee ground emesis for inpatient EGD 4/16 afib with RVR, concern for pulm edema, PCCM consult and ICU txfr 4/17: ETT air leak, fixed with more balloon inflation. On Precedex  and Fentanyl  drips for sedation. Controlled Afib 4/18  Interim History / Subjective:  No events.  Objective   Blood pressure 103/61, pulse (!) 112, temperature (!) 96.7 F (35.9 C), temperature source Axillary, resp. rate 17, height 5\' 10"   (1.778 m), weight 95.7 kg, SpO2 93%. CVP:  [6 mmHg-12 mmHg] 6 mmHg  Vent Mode: PRVC FiO2 (%):  [45 %-65 %] 65 % Set Rate:  [20 bmp] 20 bmp Vt Set:  [500 mL] 500 mL PEEP:  [5 cmH20-10 cmH20] 5 cmH20 Plateau Pressure:  [18 cmH20-21 cmH20] 18 cmH20   Intake/Output Summary (Last 24 hours) at 07/03/2023 0730 Last data filed at 07/03/2023 0700 Gross per 24 hour  Intake 2282.03 ml  Output 1275 ml  Net 1007.03 ml   Filed Weights   06/29/23 1202 06/30/23 1316  Weight: 103.7 kg 95.7 kg    Examination: Sedated on vent Rhonci bilaterally Tolerating TF@ 20 Abd soft, hypoactive BS No edema  Remains PEEP 5, FiO2 65 with sats 91  Resolved Hospital Problem list   Lactic acidosis  Assessment & Plan:  Acute hypoxic and hypercapnic respiratory failure- secondary to aspiration from large hiatal hernia; O2 needs still too high for extubation Hiatal hernia with oozing cameron erosions leading to UGIB- was planned for OP repair Afib on AC PTA Hx HTN Sedation related hypotension  - Push diuresis - Increase TF - Levo for MAP 65 - Completed 3 days of zosyn  for chemical aspiration - Try postural drainage with right side down - Continue AC challenge - Strengthen bowel regimen  Best Practice (right click and "Reselect all SmartList Selections" daily)   Diet/type: NPO w/ oral meds DVT prophylaxis heparin  gtt Pressure ulcer(s): N/A GI prophylaxis: PPI Lines: Central line Foley:  Yes, and it is still needed Code Status:  full  code Last date of multidisciplinary goals of care discussion []   35 min cc time  Ardelle Kos MD [facility] Personal contact information can be found on Amion  If no contact or response made please call [phone] 07/03/2023, 7:30 AM

## 2023-07-03 NOTE — Progress Notes (Signed)
 PHARMACY - ANTICOAGULATION  Pharmacy Consult for Heparin  Indication: atrial fibrillation Brief A/P: Heparin  level within goal range Continue Heparin  at current rate   Allergies  Allergen Reactions   Cephalosporins Rash   Keflex [Cephalexin] Rash   Patient Measurements: Height: 5\' 10"  (177.8 cm) Weight: 95.7 kg (210 lb 15.7 oz) IBW/kg (Calculated) : 68.5 HEPARIN  DW (KG): 91  Vital Signs: Temp: 96.7 F (35.9 C) (04/18 2341) Temp Source: Axillary (04/18 2341) BP: 99/58 (04/18 2300) Pulse Rate: 80 (04/18 2320)  Labs: Recent Labs    06/30/23 1610 06/30/23 1227 06/30/23 1617 07/01/23 0626 07/01/23 0754 07/01/23 0948 07/01/23 1715 07/01/23 1951 07/02/23 0431 07/02/23 1918 07/02/23 2319 07/02/23 2320  HGB 13.2   < > 12.9 11.0*  --    < > 10.5* 10.6* 10.6*  --   --   --   HCT 39.3   < > 38.8 31.4*  --    < > 30.7* 31.0* 30.9*  --   --   --   PLT 200  --  207 127*  --   --   --   --  115*  --   --   --   APTT 34  --   --   --   --   --   --   --   --   --  81*  --   LABPROT 15.9*  --   --   --   --   --   --   --   --   --   --   --   INR 1.3*  --   --   --   --   --   --   --   --   --   --   --   HEPARINUNFRC  --   --   --   --   --   --   --   --   --   --   --  0.39  CREATININE 0.83  --  0.97 0.98  --   --   --   --  0.74 0.69  --   --   TROPONINIHS  --   --  23*  --  17  --   --   --   --   --   --   --    < > = values in this interval not displayed.    Estimated Creatinine Clearance: 66.8 mL/min (by C-G formula based on SCr of 0.69 mg/dL).  Assessment: 83 y.o. female with h/o Afib, Eliquis  on hold for heparin   Goal of Therapy:  Heparin  level 0.3-0.5 units/ml Monitor platelets by anticoagulation protocol: Yes   Plan:  No change to heparin   Follow-up am labs.    Claudine Cullens, PharmD, BCPS  07/03/2023 1:06 AM

## 2023-07-03 NOTE — Progress Notes (Signed)
 [facility] ADULT ICU REPLACEMENT PROTOCOL   The patient does apply for the [facility] - Closed Adult ICU Electrolyte Replacment Protocol based on the criteria listed below:   1.Exclusion criteria: TCTS, ECMO, Dialysis, and Myasthenia Gravis patients 2. Is GFR >/= 30 ml/min? Yes.    Patient's GFR today is >60 3. Is SCr </= 2? Yes.   Patient's SCr is 0.85 mg/dL 4. Did SCr increase >/= 0.5 in 24 hours? No. 5.Pt's weight >40kg  Yes.   6. Abnormal electrolyte(s): K+ = 3.4  7. Electrolytes replaced per protocol 8.  Call MD STAT for K+ </= 2.5, Phos </= 1, or Mag </= 1 Physician:  Rito Chess, eMD   Darlene [name] Darlene [name] 07/03/2023 6:18 AM

## 2023-07-04 ENCOUNTER — Inpatient Hospital Stay (HOSPITAL_COMMUNITY)

## 2023-07-04 DIAGNOSIS — I952 Hypotension due to drugs: Secondary | ICD-10-CM

## 2023-07-04 DIAGNOSIS — G4731 Primary central sleep apnea: Secondary | ICD-10-CM | POA: Diagnosis not present

## 2023-07-04 DIAGNOSIS — J9601 Acute respiratory failure with hypoxia: Secondary | ICD-10-CM | POA: Diagnosis not present

## 2023-07-04 DIAGNOSIS — J9602 Acute respiratory failure with hypercapnia: Secondary | ICD-10-CM | POA: Diagnosis not present

## 2023-07-04 DIAGNOSIS — K922 Gastrointestinal hemorrhage, unspecified: Secondary | ICD-10-CM | POA: Diagnosis not present

## 2023-07-04 LAB — CBC
HCT: 30.6 % — ABNORMAL LOW (ref 36.0–46.0)
Hemoglobin: 9.7 g/dL — ABNORMAL LOW (ref 12.0–15.0)
MCH: 32.6 pg (ref 26.0–34.0)
MCHC: 31.7 g/dL (ref 30.0–36.0)
MCV: 102.7 fL — ABNORMAL HIGH (ref 80.0–100.0)
Platelets: 152 10*3/uL (ref 150–400)
RBC: 2.98 MIL/uL — ABNORMAL LOW (ref 3.87–5.11)
RDW: 14.4 % (ref 11.5–15.5)
WBC: 9.3 10*3/uL (ref 4.0–10.5)
nRBC: 0 % (ref 0.0–0.2)

## 2023-07-04 LAB — BASIC METABOLIC PANEL WITH GFR
Anion gap: 10 (ref 5–15)
BUN: 22 mg/dL (ref 8–23)
CO2: 28 mmol/L (ref 22–32)
Calcium: 9.2 mg/dL (ref 8.9–10.3)
Chloride: 106 mmol/L (ref 98–111)
Creatinine, Ser: 1.05 mg/dL — ABNORMAL HIGH (ref 0.44–1.00)
GFR, Estimated: 53 mL/min — ABNORMAL LOW (ref >60)
Glucose, Bld: 164 mg/dL — ABNORMAL HIGH (ref 70–99)
Potassium: 4.1 mmol/L (ref 3.5–5.1)
Sodium: 144 mmol/L (ref 135–145)

## 2023-07-04 LAB — GLUCOSE, CAPILLARY
Glucose-Capillary: 129 mg/dL — ABNORMAL HIGH (ref 70–99)
Glucose-Capillary: 130 mg/dL — ABNORMAL HIGH (ref 70–99)
Glucose-Capillary: 139 mg/dL — ABNORMAL HIGH (ref 70–99)
Glucose-Capillary: 149 mg/dL — ABNORMAL HIGH (ref 70–99)
Glucose-Capillary: 168 mg/dL — ABNORMAL HIGH (ref 70–99)
Glucose-Capillary: 175 mg/dL — ABNORMAL HIGH (ref 70–99)

## 2023-07-04 LAB — HEPARIN LEVEL (UNFRACTIONATED): Heparin Unfractionated: 0.49 [IU]/mL (ref 0.30–0.70)

## 2023-07-04 LAB — PHOSPHORUS: Phosphorus: 3.5 mg/dL (ref 2.5–4.6)

## 2023-07-04 LAB — TRIGLYCERIDES: Triglycerides: 96 mg/dL (ref <150)

## 2023-07-04 LAB — MAGNESIUM: Magnesium: 1.9 mg/dL (ref 1.7–2.4)

## 2023-07-04 MED ORDER — DIGOXIN 0.25 MG/ML IJ SOLN
0.2500 mg | Freq: Four times a day (QID) | INTRAMUSCULAR | Status: AC
  Administered 2023-07-04 (×2): 0.25 mg via INTRAVENOUS
  Filled 2023-07-04 (×2): qty 1

## 2023-07-04 MED ORDER — ETOMIDATE 2 MG/ML IV SOLN
10.0000 mg | Freq: Once | INTRAVENOUS | Status: DC
  Filled 2023-07-04: qty 10

## 2023-07-04 MED ORDER — ROCURONIUM BROMIDE 10 MG/ML (PF) SYRINGE
100.0000 mg | PREFILLED_SYRINGE | Freq: Once | INTRAVENOUS | Status: DC
  Filled 2023-07-04: qty 10

## 2023-07-04 MED ORDER — AMIODARONE LOAD VIA INFUSION
300.0000 mg | Freq: Once | INTRAVENOUS | Status: AC
  Administered 2023-07-04: 300 mg via INTRAVENOUS
  Filled 2023-07-04: qty 166.67

## 2023-07-04 MED ORDER — NALOXONE HCL 0.4 MG/ML IJ SOLN: 0.4000 mg | Freq: Once | INTRAMUSCULAR | Status: AC

## 2023-07-04 MED ORDER — AMIODARONE HCL IN DEXTROSE 360-4.14 MG/200ML-% IV SOLN
60.0000 mg/h | INTRAVENOUS | Status: AC
  Administered 2023-07-04 (×3): 60 mg/h via INTRAVENOUS
  Filled 2023-07-04 (×2): qty 200

## 2023-07-04 MED ORDER — AMIODARONE LOAD VIA INFUSION
150.0000 mg | Freq: Once | INTRAVENOUS | Status: AC
  Administered 2023-07-04: 150 mg via INTRAVENOUS
  Filled 2023-07-04: qty 83.34

## 2023-07-04 MED ORDER — NALOXONE HCL 0.4 MG/ML IJ SOLN
INTRAMUSCULAR | Status: AC
Start: 2023-07-04 — End: 2023-07-04
  Administered 2023-07-04: 0.4 mg via INTRAVENOUS
  Filled 2023-07-04: qty 1

## 2023-07-04 MED ORDER — MIDAZOLAM HCL 2 MG/2ML IJ SOLN
2.0000 mg | Freq: Once | INTRAMUSCULAR | Status: DC
  Filled 2023-07-04: qty 2

## 2023-07-04 MED ORDER — AMIODARONE HCL IN DEXTROSE 360-4.14 MG/200ML-% IV SOLN
30.0000 mg/h | INTRAVENOUS | Status: DC
  Administered 2023-07-05 – 2023-07-07 (×5): 30 mg/h via INTRAVENOUS
  Filled 2023-07-04 (×6): qty 200

## 2023-07-04 MED ORDER — MAGNESIUM SULFATE 2 GM/50ML IV SOLN
2.0000 g | Freq: Once | INTRAVENOUS | Status: AC
  Administered 2023-07-04: 2 g via INTRAVENOUS
  Filled 2023-07-04: qty 50

## 2023-07-04 MED ORDER — NALOXONE HCL 0.4 MG/ML IJ SOLN: 0.4000 mg | INTRAMUSCULAR | Status: DC | PRN

## 2023-07-04 MED ORDER — FENTANYL CITRATE PF 50 MCG/ML IJ SOSY
100.0000 ug | PREFILLED_SYRINGE | Freq: Once | INTRAMUSCULAR | Status: DC
  Filled 2023-07-04: qty 2

## 2023-07-04 NOTE — Progress Notes (Signed)
 Remains tenuous after extubation. Bradypneic and afib rvr. Given amio, dig. Also gave narcan  which seemed to help. Monitor closely.

## 2023-07-04 NOTE — Plan of Care (Signed)
  Problem: Clinical Measurements: Goal: Ability to maintain clinical measurements within normal limits will improve Outcome: Progressing Goal: Cardiovascular complication will be avoided Outcome: Progressing   Problem: Nutrition: Goal: Adequate nutrition will be maintained Outcome: Progressing   Problem: Coping: Goal: Level of anxiety will decrease Outcome: Progressing   Problem: Elimination: Goal: Will not experience complications related to bowel motility Outcome: Progressing   Problem: Pain Managment: Goal: General experience of comfort will improve and/or be controlled Outcome: Progressing

## 2023-07-04 NOTE — Progress Notes (Signed)
 NAME:  Darlene [name], MRN:  [id number], DOB:  Feb 15, 1941, LOS: 4 ADMISSION DATE:  06/29/2023, CONSULTATION DATE:  06/30/2023 REFERRING MD: TRH, CHIEF COMPLAINT: afib/flutter RVR    History of Present Illness:  Darlene [name] is a 83 y.o. F with PMH significant for atrial fibrillation on eliquis , OSA on CPAP, HTN, HL, GERD and esophageal stricture who presented to the ED 4/15 with coffee ground emesis.  This initially began about one week before discharge and she was supposed to follow up for a scope outpatient for this, but was told this would need to be done inpatient.  CTA abd/pelvis did not reveal any acute source of bleeding, did show high-grade stenosis involving celiac access with collateral flow from the SMA.  Patient is already scheduled for hiatal hernia and spigelian hernia repair with Dr. Jamse Mcgee in 07/2023.  Pt has not required any transfusions    She was admitted and the plan was to head to EGD and then be discharged, however she has been NPO and her rate controlling agents were held and pt developed Afib with RVR and hypoxia/hypercarbia.  She was placed on bipap and given her recent emesis PCCM was consulted.  Family agreed to intubation and pt started on a cardizem  gtt   Pertinent  Medical History  Anxiety, Chronic lower back pain, Depression, DJD (degenerative joint disease), GERD (gastroesophageal reflux disease), and Hypertension.   Significant Hospital Events: Including procedures, antibiotic start and stop dates in addition to other pertinent events   4/15 admit with coffee ground emesis for inpatient EGD 4/16 afib with RVR, concern for pulm edema, PCCM consult and ICU txfr 4/17: ETT air leak, fixed with more balloon inflation. On Precedex  and Fentanyl  drips for sedation. Controlled Afib 4/18  Interim History / Subjective:  No events. Not much diuresis Still on HHFNC  Objective   Blood pressure 94/67, pulse 94, temperature 98.2 F (36.8 C), temperature source Oral, resp.  rate 20, height 5\' 10"  (1.778 m), weight 99.5 kg, SpO2 94%. CVP:  [4 mmHg-9 mmHg] 8 mmHg  Vent Mode: PRVC FiO2 (%):  [60 %-70 %] 60 % Set Rate:  [20 bmp] 20 bmp Vt Set:  [500 mL] 500 mL PEEP:  [5 cmH20] 5 cmH20 Plateau Pressure:  [18 cmH20-20 cmH20] 18 cmH20   Intake/Output Summary (Last 24 hours) at 07/04/2023 0740 Last data filed at 07/04/2023 4098 Gross per 24 hour  Intake 3026.61 ml  Output 2676 ml  Net 350.61 ml   Filed Weights   06/29/23 1202 06/30/23 1316 07/04/23 0544  Weight: 103.7 kg 95.7 kg 99.5 kg    Examination: No distress R>L rhonci Moves to command with sedation wean Trace edema Pupils small, equal Abd soft  CXR bibasilar effusions  Resolved Hospital Problem list   Lactic acidosis  Assessment & Plan:  Acute hypoxic and hypercapnic respiratory failure- secondary to aspiration from large hiatal hernia; O2 needs still too high for extubation Hiatal hernia with oozing cameron erosions leading to UGIB- was planned for OP repair Afib on AC PTA Hx HTN Sedation related hypotension Constipation improved  - Wean to extubate to HHFNC, most of O2 need is atelectasis from less diaphragmatic excursion; tried diuresis, stubborn and CVP not that high - Completed a course of abx - Tolerating AC for now - Resume TF once off vent: should keep cortrak in and stay NPO - Surgical plans TBD, trying to rehab first then hopefully can get hernia repaired - Family updated at bedside regarding plan  Best Practice (  right click and "Reselect all SmartList Selections" daily)   Diet/type: TF DVT prophylaxis heparin  gtt Pressure ulcer(s): N/A GI prophylaxis: PPI Lines: Central line Foley:  Yes, and it is still needed Code Status:  full code Last date of multidisciplinary goals of care discussion []   32 min cc time  Ardelle Kos MD [facility] Personal contact information can be found on Amion  If no contact or response made please call [phone] 07/04/2023,  7:40 AM

## 2023-07-04 NOTE — Progress Notes (Signed)
 eLink Physician-Brief Progress Note Patient Name: Darlene [name] DOB: [birth year] MRN: [id number]   Date of Service  07/04/2023  HPI/Events of Note  Notified about hypotension with MAP of 56.  Currently on norepinephrine  and has additional dose of Lasix  due for administration this morning.  CVP of 4.  Currently up to 6 mcg of norepinephrine .  eICU Interventions  Maintain norepinephrine  as needed  Discontinue scheduled Lasix  for now     Intervention Category Intermediate Interventions: Hypotension - evaluation and management  Deloy Archey 07/04/2023, 12:58 AM

## 2023-07-04 NOTE — Progress Notes (Signed)
 [facility] ADULT ICU REPLACEMENT PROTOCOL   The patient does apply for the [facility] Adult ICU Electrolyte Replacment Protocol based on the criteria listed below:   1.Exclusion criteria: TCTS, ECMO, Dialysis, and Myasthenia Gravis patients 2. Is GFR >/= 30 ml/min? Yes.    Patient's GFR today is >50 3. Is SCr </= 2? Yes.   Patient's SCr is 1.05 mg/dL 4. Did SCr increase >/= 0.5 in 24 hours? No. 5.Pt's weight >40kg  Yes.   6. Abnormal electrolyte(s): Mg 1.9 (pt with afib)  7. Electrolytes replaced per protocol 8.  Call MD STAT for K+ </= 2.5, Phos </= 1, or Mag </= 1   Enrigue Harvard, PharmD, BCPS Please see amion for complete clinical pharmacist phone list  07/04/2023 7:13 AM

## 2023-07-04 NOTE — Progress Notes (Signed)
 RT note. IS and flutter done at this time, patient & family educated.    07/04/23 1821  Incentive Spirometry Tx  Level of Service Assisted by RCP  Treatment Tolerance Tolerated well  IS - Achieved (mL) (RN, NT, or RT) 750 mL

## 2023-07-04 NOTE — Progress Notes (Signed)
 Extubation Procedure Note  Patient Details:   Name: MCKINZIE SAKSA DOB: 1940-05-27 MRN: [id number]   Airway Documentation:  Airway 7.5 mm (Active)  Secured at (cm) 25 cm 07/04/23 0713  Measured From Lips 07/04/23 0713  Secured Location Left 07/04/23 0713  Secured By [employer] 07/04/23 0713  Bite Block Yes 07/04/23 0713  Tube Holder Repositioned Yes 07/04/23 0713  Prone position No 07/04/23 0713  Cuff Pressure (cm H2O) Clear OR 27-39 [facility] 07/04/23 0713  Site Condition Dry 07/04/23 0713   Vent end date: (not recorded) Vent end time: (not recorded)   Evaluation  O2 sats: stable throughout Complications: No apparent complications Patient did tolerate procedure well. Bilateral Breath Sounds: Clear, Diminished   No Patient extubated to [facility] per MD 45L-100%, patient sat 92% no labored breathing noted, patient with shallow breath and weak cough. Patient not able to say name at this time. No stridor heard. RT will continue to monitor.   07/04/23 0947  Therapy Vitals  Pulse Rate (!) 137  Resp 19  BP 115/80  MEWS Score/Color  MEWS Score 4  MEWS Score Color Red  Respiratory Assessment  Assessment Type Assess only  Respiratory Pattern Regular;Unlabored  Chest Assessment Chest expansion symmetrical  Cough Weak;Non-productive  Bilateral Breath Sounds Clear;Diminished  Oxygen Therapy/Pulse Ox  O2 Device (S)  HHFNC  $ High Flow Nasal Cannula  Yes  O2 Therapy Oxygen humidified  Heater temperature 93.2 F (34 C)  O2 Flow Rate (L/min) 45 L/min  FiO2 (%) 100 %  SpO2 92 %    Carlena Chatters 07/04/2023, 9:52 AM

## 2023-07-04 NOTE — Progress Notes (Signed)
 PHARMACY - ANTICOAGULATION  Pharmacy Consult for Heparin  Indication: atrial fibrillation  Allergies  Allergen Reactions   Cephalosporins Rash   Keflex [Cephalexin] Rash   Patient Measurements: Height: 5\' 10"  (177.8 cm) Weight: 99.5 kg (219 lb 5.7 oz) IBW/kg (Calculated) : 68.5 HEPARIN  DW (KG): 91  Vital Signs: Temp: 98.2 F (36.8 C) (04/20 0710) Temp Source: Oral (04/20 0710) BP: 94/67 (04/20 0713) Pulse Rate: 94 (04/20 0713)  Labs: Recent Labs    07/01/23 0754 07/01/23 0948 07/02/23 0431 07/02/23 1918 07/02/23 2319 07/02/23 2320 07/03/23 0447 07/03/23 1514 07/04/23 0352  HGB  --    < > 10.6*  --   --   --  10.6*  --  9.7*  HCT  --    < > 30.9*  --   --   --  31.7*  --  30.6*  PLT  --   --  115*  --   --   --  135*  --  152  APTT  --   --   --   --  81*  --   --   --   --   HEPARINUNFRC  --   --   --   --   --  0.39 0.49  --  0.49  CREATININE  --   --  0.74   < >  --   --  0.85 0.92 1.05*  TROPONINIHS 17  --   --   --   --   --   --   --   --    < > = values in this interval not displayed.    Estimated Creatinine Clearance: 51.8 mL/min (A) (by C-G formula based on SCr of 1.05 mg/dL (H)).  Assessment: 83 y.o. female with h/o Afib, Eliquis  remains on hold in case surgery procedures and pt continues on heparin . Hgb down a bit to 9.7. No overt bleeding noted.  Goal of Therapy:  Heparin  level 0.3-0.5 units/ml Monitor platelets by anticoagulation protocol: Yes   Plan:  Continue heparin  infusion at 1300 units/hr Daily heparin  level and CBC   Enrigue Harvard, PharmD, BCPS Please see amion for complete clinical pharmacist phone list 07/04/2023 7:53 AM

## 2023-07-05 ENCOUNTER — Inpatient Hospital Stay (HOSPITAL_COMMUNITY)

## 2023-07-05 ENCOUNTER — Encounter (HOSPITAL_COMMUNITY): Payer: Self-pay | Admitting: Internal Medicine

## 2023-07-05 DIAGNOSIS — J9602 Acute respiratory failure with hypercapnia: Secondary | ICD-10-CM | POA: Diagnosis not present

## 2023-07-05 DIAGNOSIS — J9601 Acute respiratory failure with hypoxia: Secondary | ICD-10-CM | POA: Diagnosis not present

## 2023-07-05 DIAGNOSIS — J690 Pneumonitis due to inhalation of food and vomit: Secondary | ICD-10-CM | POA: Diagnosis not present

## 2023-07-05 DIAGNOSIS — K922 Gastrointestinal hemorrhage, unspecified: Secondary | ICD-10-CM | POA: Diagnosis not present

## 2023-07-05 LAB — BASIC METABOLIC PANEL WITH GFR
Anion gap: 9 (ref 5–15)
BUN: 19 mg/dL (ref 8–23)
CO2: 31 mmol/L (ref 22–32)
Calcium: 9.3 mg/dL (ref 8.9–10.3)
Chloride: 107 mmol/L (ref 98–111)
Creatinine, Ser: 0.78 mg/dL (ref 0.44–1.00)
GFR, Estimated: >60 mL/min (ref >60)
Glucose, Bld: 143 mg/dL — ABNORMAL HIGH (ref 70–99)
Potassium: 3.9 mmol/L (ref 3.5–5.1)
Sodium: 147 mmol/L — ABNORMAL HIGH (ref 135–145)

## 2023-07-05 LAB — CBC
HCT: 31 % — ABNORMAL LOW (ref 36.0–46.0)
Hemoglobin: 9.8 g/dL — ABNORMAL LOW (ref 12.0–15.0)
MCH: 32.5 pg (ref 26.0–34.0)
MCHC: 31.6 g/dL (ref 30.0–36.0)
MCV: 102.6 fL — ABNORMAL HIGH (ref 80.0–100.0)
Platelets: 116 10*3/uL — ABNORMAL LOW (ref 150–400)
RBC: 3.02 MIL/uL — ABNORMAL LOW (ref 3.87–5.11)
RDW: 13.9 % (ref 11.5–15.5)
WBC: 8.4 10*3/uL (ref 4.0–10.5)
nRBC: 0.2 % (ref 0.0–0.2)

## 2023-07-05 LAB — GLUCOSE, CAPILLARY
Glucose-Capillary: 131 mg/dL — ABNORMAL HIGH (ref 70–99)
Glucose-Capillary: 119 mg/dL — ABNORMAL HIGH (ref 70–99)
Glucose-Capillary: 136 mg/dL — ABNORMAL HIGH (ref 70–99)
Glucose-Capillary: 116 mg/dL — ABNORMAL HIGH (ref 70–99)
Glucose-Capillary: 129 mg/dL — ABNORMAL HIGH (ref 70–99)
Glucose-Capillary: 123 mg/dL — ABNORMAL HIGH (ref 70–99)

## 2023-07-05 LAB — MAGNESIUM: Magnesium: 2.3 mg/dL (ref 1.7–2.4)

## 2023-07-05 LAB — PHOSPHORUS: Phosphorus: 2.6 mg/dL (ref 2.5–4.6)

## 2023-07-05 LAB — HEPARIN LEVEL (UNFRACTIONATED): Heparin Unfractionated: 0.39 [IU]/mL (ref 0.30–0.70)

## 2023-07-05 MED ORDER — SODIUM CHLORIDE 3 % IN NEBU
4.0000 mL | INHALATION_SOLUTION | Freq: Two times a day (BID) | RESPIRATORY_TRACT | Status: AC
Start: 2023-07-05 — End: 2023-07-08
  Administered 2023-07-05 – 2023-07-07 (×6): 4 mL via RESPIRATORY_TRACT
  Filled 2023-07-05 (×6): qty 15

## 2023-07-05 MED ORDER — CYCLOBENZAPRINE HCL 5 MG PO TABS
5.0000 mg | ORAL_TABLET | Freq: Once | ORAL | Status: AC
  Administered 2023-07-05: 5 mg
  Filled 2023-07-05: qty 1

## 2023-07-05 MED ORDER — BUSPIRONE HCL 5 MG PO TABS
5.0000 mg | ORAL_TABLET | Freq: Three times a day (TID) | ORAL | Status: DC | PRN
  Administered 2023-07-05: 5 mg
  Filled 2023-07-05: qty 1

## 2023-07-05 MED ORDER — POTASSIUM CHLORIDE 20 MEQ PO PACK: 40.0000 meq | PACK | Freq: Once | ORAL | Status: DC

## 2023-07-05 MED ORDER — POTASSIUM CHLORIDE 20 MEQ PO PACK
20.0000 meq | PACK | Freq: Once | ORAL | Status: AC
  Administered 2023-07-05: 20 meq
  Filled 2023-07-05: qty 1

## 2023-07-05 MED ORDER — METOPROLOL TARTRATE 5 MG/5ML IV SOLN: 2.5000 mg | Freq: Four times a day (QID) | INTRAVENOUS | Status: DC | PRN

## 2023-07-05 MED ORDER — FREE WATER
200.0000 mL | Freq: Four times a day (QID) | Status: DC
  Administered 2023-07-05 – 2023-07-07 (×7): 200 mL

## 2023-07-05 MED ORDER — ORAL CARE MOUTH RINSE
15.0000 mL | OROMUCOSAL | Status: DC
  Administered 2023-07-05 – 2023-07-29 (×95): 15 mL via OROMUCOSAL

## 2023-07-05 MED ORDER — METOPROLOL TARTRATE 25 MG PO TABS
25.0000 mg | ORAL_TABLET | Freq: Two times a day (BID) | ORAL | Status: DC
  Administered 2023-07-05 (×2): 25 mg
  Filled 2023-07-05 (×2): qty 1

## 2023-07-05 MED ORDER — BUSPIRONE HCL 5 MG PO TABS
5.0000 mg | ORAL_TABLET | Freq: Three times a day (TID) | ORAL | Status: DC
  Administered 2023-07-05 – 2023-07-26 (×62): 5 mg
  Filled 2023-07-05 (×62): qty 1

## 2023-07-05 MED ORDER — SODIUM CHLORIDE 0.9 % IV SOLN
12.5000 mg | Freq: Once | INTRAVENOUS | Status: AC
  Administered 2023-07-05: 12.5 mg via INTRAVENOUS
  Filled 2023-07-05: qty 0.5

## 2023-07-05 MED ORDER — ORAL CARE MOUTH RINSE: 15.0000 mL | OROMUCOSAL | Status: DC | PRN

## 2023-07-05 MED ORDER — ACETAMINOPHEN 160 MG/5ML PO SOLN
650.0000 mg | Freq: Four times a day (QID) | ORAL | Status: DC | PRN
  Administered 2023-07-06 – 2023-07-07 (×2): 650 mg
  Filled 2023-07-05 (×2): qty 20.3

## 2023-07-05 MED ORDER — TRAMADOL HCL 50 MG PO TABS
50.0000 mg | ORAL_TABLET | Freq: Four times a day (QID) | ORAL | Status: DC | PRN
  Administered 2023-07-05 – 2023-07-06 (×3): 50 mg via ORAL
  Filled 2023-07-05 (×4): qty 1

## 2023-07-05 MED ORDER — ACETAMINOPHEN 650 MG RE SUPP
650.0000 mg | Freq: Four times a day (QID) | RECTAL | Status: DC | PRN
Start: 2023-07-05 — End: 2023-07-21
  Administered 2023-07-05: 650 mg via RECTAL
  Filled 2023-07-05: qty 1

## 2023-07-05 MED ORDER — SODIUM CHLORIDE 0.9 % IV SOLN
2.0000 g | INTRAVENOUS | Status: DC
  Administered 2023-07-05 – 2023-07-08 (×4): 2 g via INTRAVENOUS
  Filled 2023-07-05 (×4): qty 20

## 2023-07-05 MED ORDER — ACETAMINOPHEN 325 MG PO TABS
650.0000 mg | ORAL_TABLET | Freq: Four times a day (QID) | ORAL | Status: DC | PRN
  Administered 2023-07-05: 650 mg via ORAL
  Filled 2023-07-05 (×4): qty 2

## 2023-07-05 NOTE — Progress Notes (Signed)
 PHARMACY - ANTICOAGULATION   Pharmacy Consult for Heparin  Indication: atrial fibrillation  Allergies  Allergen Reactions   Cephalosporins Rash   Keflex [Cephalexin] Rash   Patient Measurements: Height: 5\' 10"  (177.8 cm) Weight: 98 kg (216 lb 0.8 oz) IBW/kg (Calculated) : 68.5 HEPARIN  DW (KG): 91  Vital Signs: Temp: 101.1 F (38.4 C) (04/21 0710) Temp Source: Axillary (04/21 0710) BP: 125/69 (04/21 0800) Pulse Rate: 128 (04/21 0800)  Labs: Recent Labs    07/02/23 2319 07/02/23 2320 07/03/23 0447 07/03/23 1514 07/04/23 0352 07/05/23 0343  HGB  --    < > 10.6*  --  9.7* 9.8*  HCT  --   --  31.7*  --  30.6* 31.0*  PLT  --   --  135*  --  152 116*  APTT 81*  --   --   --   --   --   HEPARINUNFRC  --    < > 0.49  --  0.49 0.39  CREATININE  --   --  0.85 0.92 1.05* 0.78   < > = values in this interval not displayed.    Estimated Creatinine Clearance: 67.5 mL/min (by C-G formula based on SCr of 0.78 mg/dL).  Assessment: 83 y.o. female with h/o Afib on Eliquis  admitted with hiatal hernia with oozing cameron erosions leading to UGIB. Pharmacy has been consulted to dose heparin  while holding Eliquis  in case of procedures.  Heparin  level is therapeutic (0.39) on 1300 units/hr. No issues with infusion or s/sx of bleeding per RN. CBC stable.  Goal of Therapy:  Heparin  level 0.3-0.5 units/ml Monitor platelets by anticoagulation protocol: Yes   Plan:  Continue heparin  infusion at 1300 units/hr Daily heparin  level and CBC F/u surgical plans  Abelina Abide, PharmD PGY1 Pharmacy Resident 07/05/2023 8:46 AM

## 2023-07-05 NOTE — TOC Progression Note (Signed)
 Transition of Care [facility]) - Progression Note    Patient Details  Name: Darlene [name] MRN: [id number] Date of Birth: March 19, 1940  [facility]) CM/SW Contact  Tom-Johnson, Alioune Hodgkin Daphne, RN Phone Number: 07/05/2023, 12:30 PM  Clinical Narrative:     Patient was extubated on 07/04/23, currently on 45L HHFNC , on Airway watch. On Heparin  gtt for A-fib, IV abx and Amio gtt for Tachycardia.   Patient not Medically ready for discharge.  CM will continue to follow as patient progresses with care towards discharge.            Expected Discharge Plan and Services                                               Social Determinants of Health (SDOH) Interventions SDOH Screenings   Food Insecurity: No Food Insecurity (06/29/2023)  Housing: Low Risk  (06/29/2023)  Transportation Needs: No Transportation Needs (06/29/2023)  Utilities: Not At Risk (06/29/2023)  Alcohol Screen: Low Risk  (04/28/2019)  Depression (PHQ2-9): Medium Risk (06/15/2023)  Financial Resource Strain: Low Risk  (02/03/2021)  Physical Activity: Inactive (04/28/2019)  Social Connections: Moderately Isolated (06/29/2023)  Stress: No Stress Concern Present (04/28/2019)  Tobacco Use: Low Risk  (06/30/2023)    Readmission Risk Interventions     No data to display

## 2023-07-05 NOTE — Progress Notes (Signed)
 Nutrition Follow-up  DOCUMENTATION CODES:   Not applicable  INTERVENTION:  Advance TF back to goal : Vital 1.5 at 73ml/hr ( per day) *increase to 2ml/h and advance to goal rate in q10h if tolerating at current rate 60ml ProSource TF20 once daily TF at goal provides: 1700kcal, 93g protein and free water  daily  Free water  flushes 200ml q6h per CCM ( per day) Total free water  (TF+FWF): daily  NUTRITION DIAGNOSIS:  Inadequate oral intake related to altered GI function (large hiatal hernia; cameron erosions) as evidenced by NPO status. - remains applicable  GOAL:  Patient will meet greater than or equal to 90% of their needs - unmet, addressing via TF advancement  MONITOR:  Vent status, Labs, Weight trends, TF tolerance  REASON FOR ASSESSMENT:  Ventilator, Consult Enteral/tube feeding initiation and management  ASSESSMENT:  Pt admitted with coffee ground emesis. PMH significant for afib on eliquis , OSA, HTN, HLD, GERD and esophageal stricture.  4/15: admit with coffee ground emesis 4/16: developed afib w/RVR, concern for pulmonary edema, transfer to ICU; intubated 4/17: EGD- large hiatal hernia with oozing cameron erosions; large residue and coffee ground material in hiatal hernia 4/18: cortrak placed; tip in distal stomach past hiatal hernia 4/20: extubated  Per CCM, Surgical plans for hernia repair TBD. Trying to rehab first.  Remains in ICU d/t high O2 requirements.   TF was advancing over the weekend however held yesterday morning d/t abdominal pain. KUB obtained. MD amenable to resuming TF at trickle rate following imaging. NP reached out regarding further advancement today. Discussed with RN.   Pt drowsy at time of visit. Spoke with her daughter at bedside. She mentions that pt had gotten up briefly to the chair with PT which wore her out. She denies any episodes of emesis. Pt denies c/o abdominal pain. Last BM documented per nursing was  yesterday. Daughter reports enema was given this morning.   Admit weight: 103.7 kg Current weight: 98 kg + edema: mild pitting generalized, non-pitting BUE, mild pitting BLE  Drains/lines: UOP: x24 hours Cortrak  Medications: dulcolax daily, colace BID, SSI 0-9 units q4h, miralax  daily  Labs:  Sodium 145 (wdl) CBG's 61-175 x24 hours  Diet Order:   Diet Order             Diet NPO time specified  Diet effective now                   EDUCATION NEEDS:   Education needs have been addressed  Skin:  Skin Assessment: Reviewed RN Assessment  Last BM:  4/21 type 7  Height:   Ht Readings from Last 1 Encounters:  07/02/23 5\' 10"  (1.778 m)    Weight:   Wt Readings from Last 1 Encounters:  07/05/23 98 kg    Ideal Body Weight:  68.2 kg  BMI:  Body mass index is 31 kg/m.  Estimated Nutritional Needs:   Kcal:  1700-1900  Protein:  85-100g  Fluid:  >/=1.7L  Rocklin Chute, RDN, LDN Clinical Nutrition See AMiON for contact information.

## 2023-07-05 NOTE — Progress Notes (Signed)
 eLink Physician-Brief Progress Note Patient Name: Darlene [name] DOB: 13-Feb-1941 MRN: [id number]   Date of Service  07/05/2023  HPI/Events of Note  83 year old female that initially presented with acute hypoxic hypercapnic respiratory failure in the setting of aspiration complicated by GI bleed.  She was extubated yesterday and placed on heated high flow nasal cannula.  Complaining of nausea despite Zofran  and epigastric pain at 9 out of 10.  On camera examination, the patient is somnolent but in no distress.  Vital signs have been stable without physiological signs of distress  eICU Interventions  Can attempt promethazine  x 1  Avoid opiate analgesics in the setting of somnolence and encephalopathy.  No clear alternatives noted, but given lack of distress, suspect verbal pain scoring may be disproportionate     Intervention Category Intermediate Interventions: Pain - evaluation and management  Teigen Parslow 07/05/2023, 4:57 AM

## 2023-07-05 NOTE — Progress Notes (Signed)
 NAME:  FRANCI OSHANA, MRN:  [id number], DOB:  27-Aug-1940, LOS: 5 ADMISSION DATE:  06/29/2023, CONSULTATION DATE:  06/30/2023 REFERRING MD: TRH, CHIEF COMPLAINT: afib/flutter RVR    History of Present Illness:  Darlene [name] is a 83 y.o. F with PMH significant for atrial fibrillation on eliquis , OSA on CPAP, HTN, HL, GERD and esophageal stricture who presented to the ED 4/15 with coffee ground emesis.  This initially began about one week before discharge and she was supposed to follow up for a scope outpatient for this, but was told this would need to be done inpatient.  CTA abd/pelvis did not reveal any acute source of bleeding, did show high-grade stenosis involving celiac access with collateral flow from the SMA.  Patient is already scheduled for hiatal hernia and spigelian hernia repair with Dr. Jamse Mcgee in 07/2023.  Pt has not required any transfusions    She was admitted and the plan was to head to EGD and then be discharged, however she has been NPO and her rate controlling agents were held and pt developed Afib with RVR and hypoxia/hypercarbia.  She was placed on bipap and given her recent emesis PCCM was consulted.  Family agreed to intubation and pt started on a cardizem  gtt   Pertinent  Medical History  Anxiety, Chronic lower back pain, Depression, DJD (degenerative joint disease), GERD (gastroesophageal reflux disease), and Hypertension.   Significant Hospital Events: Including procedures, antibiotic start and stop dates in addition to other pertinent events   4/15 admit with coffee ground emesis for inpatient EGD 4/16 afib with RVR, concern for pulm edema, PCCM consult and ICU txfr 4/17: ETT air leak, fixed with more balloon inflation. On Precedex  and Fentanyl  drips for sedation. Controlled Afib 4/21 Complaints of epigastric pain vs delirium overnight, still on amio gtt/heparin . Tube feeds paused this early this morning.    Interim History / Subjective:  Still on HHFNC   Objective    Blood pressure 131/72, pulse (!) 124, temperature (!) 101.1 F (38.4 C), temperature source Axillary, resp. rate (!) 23, height 5\' 10"  (1.778 m), weight 98 kg, SpO2 90%. CVP:  [8 mmHg] 8 mmHg  FiO2 (%):  [85 %-100 %] 85 %   Intake/Output Summary (Last 24 hours) at 07/05/2023 0752 Last data filed at 07/05/2023 8182 Gross per 24 hour  Intake 1933.22 ml  Output 785 ml  Net 1148.22 ml   Filed Weights   06/30/23 1316 07/04/23 0544 07/05/23 0522  Weight: 95.7 kg 99.5 kg 98 kg    Examination: General: acute on chronic elderly female, lying in icu bed on HHFNC-no distress  HEENT: Normocephalic, PERRLA intact, missing teeth, pink moist MM, HHFNC in nears CV: s1,s2, afib- HR 130s, no MRG, No JVD  pulm: clear, diminished, no distress on vent  Abs: bs active, soft, Cortrak in place  Extremities: no edema, no deformity, moves all extremities on command  Skin: no rash  Neuro: Rass 0 to -1, alert and oriented x 3, disoriented to time, concern for periods of delirium  GU: foley intact, clear yellow   CXR bibasilar effusions  Resolved Hospital Problem list   Lactic acidosis Sedation related hypotension  Assessment & Plan:  Acute hypoxic and hypercapnic respiratory failure- secondary to aspiration from large hiatal hernia Extubated to [facility] yesterday, not much success with diuresis, CVPs also not very high  Chest X-ray on 4/20-showing unchanged pulmonary lower lobe opacities  Zosyn  on 4/15 to 4/18  Fever trend increasing on 4/20  P:  Wean  Fio2 requirements to sat goal of >92%  HOB > 30 degrees Intermittent Chest X-rays Continue to follow blood cultures, and tracheal aspirate  Continue aspiration precautions   Concern for Delirium Patient oriented x 3, disoriented to time  P:  Continue delirium precautions  Continue lexapro   Continue to trend Na daily   Fever Trending up, zosyn  stopped on 4/18  Still showing lower lobe opacities on chest X-ray on 4/20  No leukocytosis  Foley in  for retention  Questionable Epigastric pain noted overnight per Elink on 4/20  P:  Start Ceftriaxone   Remove foley  Obtain KUB   Hiatal hernia with oozing cameron erosions leading to UGIB- was planned for OP repair Upper EGD 4/17 revealed large hiatal hernia with a oozing Cameron erosions.. Large amount of food (residue) and coffee ground material was observed in the hiatal hernia portion of the stomach.  -Tentative plan for surgical correction of hiatal hernia with Dr. Lanell Pinta 08/03/2023 Still complaining of abdominal pain overnight with some epigastric pain P:  Continue Cortrak -Restart tube feeding if KUB stable Continue reglan    Hypernatremia  Had been receiving diuresis with lasix - with minimal output P: Continue to trend Na daily  Will hold off free water  for now   Afib on AC PTA P:+ Continue heparin  gtt and amio gtt for now   Hx HTN  Patient had been on levophed , but stopped on 4/19  P:  Continue to monitor BP  DC levophed  Continue to hold antihypertensives for now   Constipation-improved  P:  Continue miralax  per tube, and continue colace  Continue reglan   Continue protonix     Best Practice (right click and "Reselect all SmartList Selections" daily)   Diet/type: TF DVT prophylaxis heparin  gtt Pressure ulcer(s): N/A GI prophylaxis: PPI Lines: Central line Foley:  Yes, and it is still needed Code Status:  full code Last date of multidisciplinary goals of care discussion Georgetta Kinnier update family at beside on 4/21   Rey Catholic AGACNP-BC   [facility] 07/05/2023, 8:30 AM  Please see [email or web address] for pager details.  From 7A-7P if no response, please call [phone]. After hours, please call ELink [phone].

## 2023-07-06 DIAGNOSIS — J9602 Acute respiratory failure with hypercapnia: Secondary | ICD-10-CM | POA: Diagnosis not present

## 2023-07-06 DIAGNOSIS — K922 Gastrointestinal hemorrhage, unspecified: Secondary | ICD-10-CM | POA: Diagnosis not present

## 2023-07-06 DIAGNOSIS — J690 Pneumonitis due to inhalation of food and vomit: Secondary | ICD-10-CM | POA: Diagnosis not present

## 2023-07-06 DIAGNOSIS — T17908A Unspecified foreign body in respiratory tract, part unspecified causing other injury, initial encounter: Secondary | ICD-10-CM

## 2023-07-06 DIAGNOSIS — J9601 Acute respiratory failure with hypoxia: Secondary | ICD-10-CM | POA: Diagnosis not present

## 2023-07-06 LAB — GLUCOSE, CAPILLARY
Glucose-Capillary: 175 mg/dL — ABNORMAL HIGH (ref 70–99)
Glucose-Capillary: 61 mg/dL — ABNORMAL LOW (ref 70–99)
Glucose-Capillary: 136 mg/dL — ABNORMAL HIGH (ref 70–99)
Glucose-Capillary: 151 mg/dL — ABNORMAL HIGH (ref 70–99)
Glucose-Capillary: 169 mg/dL — ABNORMAL HIGH (ref 70–99)
Glucose-Capillary: 136 mg/dL — ABNORMAL HIGH (ref 70–99)
Glucose-Capillary: 169 mg/dL — ABNORMAL HIGH (ref 70–99)

## 2023-07-06 LAB — BASIC METABOLIC PANEL WITH GFR
Anion gap: 11 (ref 5–15)
BUN: 19 mg/dL (ref 8–23)
CO2: 29 mmol/L (ref 22–32)
Calcium: 9.4 mg/dL (ref 8.9–10.3)
Chloride: 105 mmol/L (ref 98–111)
Creatinine, Ser: 0.69 mg/dL (ref 0.44–1.00)
GFR, Estimated: >60 mL/min (ref >60)
Glucose, Bld: 133 mg/dL — ABNORMAL HIGH (ref 70–99)
Potassium: 3.8 mmol/L (ref 3.5–5.1)
Sodium: 145 mmol/L (ref 135–145)

## 2023-07-06 LAB — CBC
HCT: 31.2 % — ABNORMAL LOW (ref 36.0–46.0)
Hemoglobin: 10 g/dL — ABNORMAL LOW (ref 12.0–15.0)
MCH: 32.9 pg (ref 26.0–34.0)
MCHC: 32.1 g/dL (ref 30.0–36.0)
MCV: 102.6 fL — ABNORMAL HIGH (ref 80.0–100.0)
Platelets: 131 10*3/uL — ABNORMAL LOW (ref 150–400)
RBC: 3.04 MIL/uL — ABNORMAL LOW (ref 3.87–5.11)
RDW: 13.7 % (ref 11.5–15.5)
WBC: 7.6 10*3/uL (ref 4.0–10.5)
nRBC: 0 % (ref 0.0–0.2)

## 2023-07-06 LAB — MAGNESIUM: Magnesium: 2 mg/dL (ref 1.7–2.4)

## 2023-07-06 LAB — PHOSPHORUS: Phosphorus: 2.8 mg/dL (ref 2.5–4.6)

## 2023-07-06 LAB — HEPARIN LEVEL (UNFRACTIONATED): Heparin Unfractionated: 0.27 [IU]/mL — ABNORMAL LOW (ref 0.30–0.70)

## 2023-07-06 MED ORDER — APIXABAN 5 MG PO TABS: 5.0000 mg | ORAL_TABLET | Freq: Two times a day (BID) | ORAL | Status: DC

## 2023-07-06 MED ORDER — INSULIN ASPART 100 UNIT/ML IJ SOLN
0.0000 [IU] | INTRAMUSCULAR | Status: DC
  Administered 2023-07-06 – 2023-07-07 (×4): 1 [IU] via SUBCUTANEOUS

## 2023-07-06 MED ORDER — POTASSIUM CHLORIDE 20 MEQ PO PACK
40.0000 meq | PACK | Freq: Once | ORAL | Status: AC
  Administered 2023-07-06: 40 meq
  Filled 2023-07-06: qty 2

## 2023-07-06 MED ORDER — DILTIAZEM HCL 30 MG PO TABS
30.0000 mg | ORAL_TABLET | Freq: Three times a day (TID) | ORAL | Status: DC
  Administered 2023-07-06: 30 mg via ORAL
  Filled 2023-07-06: qty 1

## 2023-07-06 MED ORDER — DEXTROSE 50 % IV SOLN
INTRAVENOUS | Status: AC
  Filled 2023-07-06: qty 50

## 2023-07-06 MED ORDER — HYDROMORPHONE HCL 1 MG/ML IJ SOLN
0.5000 mg | Freq: Once | INTRAMUSCULAR | Status: AC
  Administered 2023-07-06: 0.5 mg via INTRAVENOUS
  Filled 2023-07-06: qty 0.5

## 2023-07-06 MED ORDER — DEXTROSE 50 % IV SOLN
12.5000 g | INTRAVENOUS | Status: AC
  Administered 2023-07-06: 12.5 g via INTRAVENOUS

## 2023-07-06 MED ORDER — APIXABAN 5 MG PO TABS
5.0000 mg | ORAL_TABLET | Freq: Two times a day (BID) | ORAL | Status: DC
  Administered 2023-07-06 – 2023-07-11 (×12): 5 mg
  Filled 2023-07-06 (×13): qty 1

## 2023-07-06 MED ORDER — METOPROLOL TARTRATE 50 MG PO TABS
50.0000 mg | ORAL_TABLET | Freq: Two times a day (BID) | ORAL | Status: DC
  Administered 2023-07-06 – 2023-07-10 (×9): 50 mg
  Filled 2023-07-06 (×9): qty 1

## 2023-07-06 MED ORDER — VITAL 1.5 CAL PO LIQD
1000.0000 mL | ORAL | Status: DC
  Administered 2023-07-06 – 2023-07-20 (×13): 1000 mL
  Filled 2023-07-06 (×11): qty 1000

## 2023-07-06 MED ORDER — TRAMADOL HCL 50 MG PO TABS
50.0000 mg | ORAL_TABLET | Freq: Four times a day (QID) | ORAL | Status: DC | PRN
  Administered 2023-07-06 – 2023-07-25 (×8): 50 mg
  Filled 2023-07-06 (×8): qty 1

## 2023-07-06 MED ORDER — DILTIAZEM HCL 30 MG PO TABS
30.0000 mg | ORAL_TABLET | Freq: Three times a day (TID) | ORAL | Status: DC
  Administered 2023-07-06 – 2023-07-12 (×16): 30 mg
  Filled 2023-07-06 (×17): qty 1

## 2023-07-06 NOTE — Progress Notes (Signed)
 Hypoglycemic Event  CBG: 61, then 175  Treatment: D50 25 mL (12.5 gm)  Symptoms: None  Follow-up CBG: Time:0408 CBG Result:175  Possible Reasons for Event: Inadequate meal intake  Comments/MD notified:E-Link    Grant Lay

## 2023-07-06 NOTE — Progress Notes (Signed)
 PHARMACY - ANTICOAGULATION   Pharmacy Consult for Heparin  > Eliquis  Indication: atrial fibrillation  Allergies  Allergen Reactions   Cephalosporins Rash   Keflex [Cephalexin] Rash   Patient Measurements: Height: 5\' 10"  (177.8 cm) Weight: 98 kg (216 lb 0.8 oz) IBW/kg (Calculated) : 68.5 HEPARIN  DW (KG): 91  Vital Signs: Temp: 99.3 F (37.4 C) (04/22 0724) Temp Source: Axillary (04/22 0724) BP: 140/75 (04/22 0830) Pulse Rate: 113 (04/22 0915)  Labs: Recent Labs    07/04/23 0352 07/05/23 0343 07/06/23 0333  HGB 9.7* 9.8* 10.0*  HCT 30.6* 31.0* 31.2*  PLT 152 116* 131*  HEPARINUNFRC 0.49 0.39 0.27*  CREATININE 1.05* 0.78 0.69    Estimated Creatinine Clearance: 67.5 mL/min (by C-G formula based on SCr of 0.69 mg/dL).  Assessment: 83 y.o. female with h/o Afib on Eliquis  admitted with hiatal hernia with oozing cameron erosions leading to UGIB. Patient has been on heparin  drip while holding Eliquis . Discussed with CCM, okay to transition back to Eliquis .  Hgb 10, plt 131 - stable.  Goal of Therapy:  Monitor platelets by anticoagulation protocol: Yes   Plan:  Discontinue heparin  gtt at time of first Eliquis  dose Start Eliquis  5mg  per tube BID Monitor CBC and s/sx of bleeding Pharmacy will continue to monitor peripherally  Abelina Abide, PharmD PGY1 Pharmacy Resident 07/06/2023 9:36 AM

## 2023-07-06 NOTE — Progress Notes (Signed)
 eLink Physician-Brief Progress Note Patient Name: Darlene [name] DOB: 13-Feb-1941 MRN: [id number]   Date of Service  07/06/2023  HPI/Events of Note  84 year old female that initially presented with acute hypoxic hypercapnic respiratory failure in the setting of aspiration complicated by GI bleed. She was extubated yesterday and placed on heated high flow nasal cannula.   Patient has been complaining of abdominal pain and generally just yelling out.  Looking for something as needed to address the pain.  Had recent GI bleed  eICU Interventions  Dilaudid  0.5 mg x 1     Intervention Category Minor Interventions: Agitation / anxiety - evaluation and management  Darlene [name] 07/06/2023, 1:21 AM

## 2023-07-06 NOTE — Progress Notes (Signed)
 OT Cancellation Note  Patient Details Name: Darlene [name] MRN: [id number] DOB: 1940-08-04   Cancelled Treatment:    Reason Eval/Treat Not Completed: Fatigue/lethargy limiting ability to participate (per RN pt very fatigued from this AM and given high O2 requirements requesting therapy follow up tomorrow. Will reattempt OT eval next date as schedule permits)  Tiara Maultsby K, OTD, OTR/L SecureChat [facility] [phone]   Antionette Kirks 07/06/2023, 3:14 PM

## 2023-07-06 NOTE — Progress Notes (Signed)
 NAME:  Darlene [name], MRN:  [id number], DOB:  11/07/1940, LOS: 6 ADMISSION DATE:  06/29/2023, CONSULTATION DATE:  06/30/2023 REFERRING MD: TRH, CHIEF COMPLAINT: afib/flutter RVR    History of Present Illness:  Darlene [name] is a 83 y.o. F with PMH significant for atrial fibrillation on eliquis , OSA on CPAP, HTN, HL, GERD and esophageal stricture who presented to the ED 4/15 with coffee ground emesis.  This initially began about one week before discharge and she was supposed to follow up for a scope outpatient for this, but was told this would need to be done inpatient.  CTA abd/pelvis did not reveal any acute source of bleeding, did show high-grade stenosis involving celiac access with collateral flow from the SMA.  Patient is already scheduled for hiatal hernia and spigelian hernia repair with Dr. Jamse Mcgee in 07/2023.  Pt has not required any transfusions    She was admitted and the plan was to head to EGD and then be discharged, however she has been NPO and her rate controlling agents were held and pt developed Afib with RVR and hypoxia/hypercarbia.  She was placed on bipap and given her recent emesis PCCM was consulted.  Family agreed to intubation and pt started on a cardizem  gtt   Pertinent  Medical History  Anxiety, Chronic lower back pain, Depression, DJD (degenerative joint disease), GERD (gastroesophageal reflux disease), and Hypertension.   Significant Hospital Events: Including procedures, antibiotic start and stop dates in addition to other pertinent events   4/15 admit with coffee ground emesis for inpatient EGD 4/16 afib with RVR, concern for pulm edema, PCCM consult and ICU txfr 4/17: ETT air leak, fixed with more balloon inflation. On Precedex  and Fentanyl  drips for sedation. Controlled Afib 4/21 Complaints of epigastric pain vs delirium overnight, still on amio gtt/heparin . Tube feeds paused this early this morning.    Interim History / Subjective:  Still on HHFNC, Less delirium  overnight and pain more controlled per daughter Patient alert and orient x 4  Objective   Blood pressure 132/78, pulse (!) 102, temperature 99.3 F (37.4 C), temperature source Axillary, resp. rate 20, height 5\' 10"  (1.778 m), weight 98 kg, SpO2 93%. CVP:  [8 mmHg-9 mmHg] 8 mmHg  FiO2 (%):  [80 %-95 %] 90 %   Intake/Output Summary (Last 24 hours) at 07/06/2023 0831 Last data filed at 07/06/2023 0617 Gross per 24 hour  Intake 925.42 ml  Output 500 ml  Net 425.42 ml   Filed Weights   06/30/23 1316 07/04/23 0544 07/05/23 0522  Weight: 95.7 kg 99.5 kg 98 kg    Examination: General: Acute on chronic elderly female, sitting up in ICU bed on heated high flow nasal cannula in no distress HEENT: Normocephalic, PERRLA intact, missing teeth, pink moist mucous membranes, high flow nasal cannula in nares CV: S1/S2, A-fib-heart rate 100-110, no MRG no JVD Pulm: Clear throughout diminished in lower bases, no distress Abdominal: Bowel sounds active, soft/core track in place Skin: No rash Neuro: RASS 0, alert and orient x 4, concern for periods of delirium GU: Deferred  Resolved Hospital Problem list   Lactic acidosis Sedation related hypotension  Assessment & Plan:  Acute hypoxic and hypercapnic respiratory failure- secondary to aspiration from large hiatal hernia Extubated to [facility] yesterday, not much success with diuresis, CVPs also not very high  Chest X-ray on 4/20-showing unchanged pulmonary lower lobe opacities  Zosyn  on 4/15 to 4/18  Fever trend increasing on 4/20  4/22 still requiring high amounts of  FiO2 via HHFNC but O2 sats seem to be improving slowly P:  Continue to wean FiO2 requirements to sat goal of greater than 88% Continue HOB >30 degrees Continue to follow blood cultures and tracheal aspirate Continue aspiration cautions Continue to work on mobilizing out of bed daily, PT/OT consult -Appreciate assistance and recommendations Continue I-S and flutter valve  Concern  for Delirium 4/22 on exam, patient alert and oriented x 4 Per daughter patient rested better last night P:  Continue delirium precautions Continue Lexapro  Continue to trend sodium daily, continue free water  flushes Continue adequate pain control-added tramadol  50 mg every 6 hours - Seems to have helped with decreasing in pain  Urinary retention Foley removed on 4/21 Had to be straight cathed at 0600 on 4/22-obtained 450 out P: Continue to BladderScan every 6 hours Intermittent/straight cath protocol  Fever Trending up, zosyn  stopped on 4/18  Still showing lower lobe opacities on chest X-ray on 4/20  No leukocytosis  4/22-started ceftriaxone  P:  To ceftriaxone  for total of 5 days Continue to trend WBCs/fever curve  Hiatal hernia with oozing cameron erosions leading to UGIB- was planned for OP repair Upper EGD 4/17 revealed large hiatal hernia with a oozing Cameron erosions.. Large amount of food (residue) and coffee ground material was observed in the hiatal hernia portion of the stomach.  -Tentative plan for surgical correction of hiatal hernia with Dr. Lanell Pinta 08/03/2023 Still complaining of abdominal pain overnight with some epigastric pain but seems that pain is better controlled since adding tramadol  as needed P:  Continue core track-increase at as tolerated, patient did have episode of hypoglycemia this morning.  Discussed with RD, in regards to increasing rate.  DC Reglan   Hypoglycemia CBG 61 at 330 this morning, received one half amp of D50 P: Discussed with RD in regards to increasing tube feed rate abdominal pain more controlled Decrease sliding scale insulin  to very sensitive scale Continue to monitor CBGs every 4 hours   Hypernatremia  Had been receiving diuresis with lasix - with minimal output 4/22 sodium 145 from 147 P: Continue to trend sodiums daily Continue free water  flushes per feeding tube-200 mls every 6  Afib on AC PTA 4/21 metoprolol  per tube  added, heart rate more controlled since P: Transition to Eliquis  per tube today discussed with pharmacy in regards to transitioning from heparin  infusion Continue Amio infusion for now, added metoprolol  25 mg twice daily on 4/21 Will increase that to 50 mg twice daily  Hx HTN  Patient had been on levophed , but stopped on 4/19  P:  Continue to monitor blood pressure Increase metoprolol  from 25 mg twice daily to 50 mg twice daily  Constipation-improved  P:  Continue MiraLAX  per tube and can continue Colace per tube Will discontinue Reglan  for now Continue Protonix    Best Practice (right click and "Reselect all SmartList Selections" daily)   Diet/type: TF DVT prophylaxis heparin  gtt-transition to Eliquis  4/22 Pressure ulcer(s): N/A GI prophylaxis: PPI Lines: Central line Foley:  Yes, and it is still needed Code Status:  full code Last date of multidisciplinary goals of care discussion [updated family-daughter at bedside on 4/22  Rey Catholic AGACNP-BC   [facility] 07/06/2023, 8:31 AM  Please see [email or web address] for pager details.  From 7A-7P if no response, please call [phone]. After hours, please call ELink [phone].

## 2023-07-06 NOTE — Evaluation (Signed)
 Physical Therapy Evaluation Patient Details Name: Darlene [name] MRN: [id number] DOB: [birth year] Today's Date: 07/06/2023  History of Present Illness  83 yo female admitted 06/29/23 with acute hypoxic hypercapnic respiratory failure in the setting of aspiration complicated by GI bleed. Intubated 4/16-4/20. 4/17 EGD. PMhx: depression, DJD, OSA, pulmonary HTN, obesity, HLD, Afib  Clinical Impression  Pt pleasant, lethargic and reports not sleeping. Pt on 90% Fio2, 45L with SPO2 pleth not picking up well and ultimately reading 88%. With transfer to chair drop to 85% and not increasing with cueing for breathing technique. RN applied NRB to achieve 90%. Pt with decreased pulmonary function, strength, balance, gait and mobility who will benefit from acute therapy to maximize mobility, safety and function. Pt unable to tolerate further activity this date and will continue to progress as cardiopulmonary function allow.   HR 110-130 with activity        If plan is discharge home, recommend the following: A lot of help with walking and/or transfers;A lot of help with bathing/dressing/bathroom;Assistance with cooking/housework;Direct supervision/assist for financial management;Assist for transportation   Can travel by private vehicle   No    Equipment Recommendations None recommended by PT  Recommendations for Other Services  OT consult    Functional Status Assessment Patient has had a recent decline in their functional status and/or demonstrates limited ability to make significant improvements in function in a reasonable and predictable amount of time     Precautions / Restrictions Precautions Precautions: Fall;Other (comment) Recall of Precautions/Restrictions: Impaired Precaution/Restrictions Comments: HHFNC, watch SPO2, cortrak      Mobility  Bed Mobility Overal bed mobility: Needs Assistance Bed Mobility: Supine to Sit     Supine to sit: Mod assist, HOB elevated     General bed  mobility comments: mod assist to initiate movement and bring legs to EOB with min assist to elevate trunk, HOB 30 degrees, increased time    Transfers Overall transfer level: Needs assistance   Transfers: Sit to/from Stand, Bed to chair/wheelchair/BSC Sit to Stand: Min assist, +2 safety/equipment   Step pivot transfers: Mod assist, +2 physical assistance, +2 safety/equipment       General transfer comment: min +2 assist to rise from surface with mod cues for hand placement and safety. Mod +2 with RW to step 2' to chair. pt with anterior lean and needing assist to control RW and balance to step to chair. SPO2 85% on 90% FIO2 at 45L with maintained even at rest despite cues, RN present and added NRB to achieve 90% SPO2    Ambulation/Gait               General Gait Details: too lethargic and decreased SPO2, not appropriate to attempt  Stairs            Wheelchair Mobility     Tilt Bed    Modified Rankin (Stroke Patients Only)       Balance Overall balance assessment: Needs assistance Sitting-balance support: No upper extremity supported, Feet supported Sitting balance-Leahy Scale: Poor Sitting balance - Comments: min assist sitting with progression to CGA   Standing balance support: Bilateral upper extremity supported, Reliant on assistive device for balance, During functional activity Standing balance-Leahy Scale: Poor Standing balance comment: UB support on RW                             Pertinent Vitals/Pain Pain Assessment Pain Assessment: No/denies pain    Home Living Family/patient  expects to be discharged to:: Private residence Living Arrangements: Children Available Help at Discharge: Family;Available 24 hours/day Type of Home: House Home Access: Level entry       Home Layout: Two level;Able to live on main level with bedroom/bathroom Home Equipment: Rolling Walker (2 wheels);Cane - single point;Shower seat      Prior Function  Prior Level of Function : Needs assist       Physical Assist : ADLs (physical)   ADLs (physical): IADLs Mobility Comments: walks with cane ADLs Comments: daughter assists with housework, pt reports ADL independently     Extremity/Trunk Assessment   Upper Extremity Assessment Upper Extremity Assessment: Generalized weakness    Lower Extremity Assessment Lower Extremity Assessment: Generalized weakness    Cervical / Trunk Assessment Cervical / Trunk Assessment: Kyphotic  Communication   Communication Communication: No apparent difficulties    Cognition Arousal: Lethargic Behavior During Therapy: Flat affect   PT - Cognitive impairments: Orientation, Awareness, Memory, Problem solving, Safety/Judgement   Orientation impairments: Situation, Time                     Following commands: Impaired Following commands impaired: Follows one step commands inconsistently, Follows one step commands with increased time     Cueing Cueing Techniques: Verbal cues, Gestural cues, Tactile cues     General Comments      Exercises     Assessment/Plan    PT Assessment Patient needs continued PT services  PT Problem List Decreased strength;Decreased coordination;Decreased activity tolerance;Decreased balance;Decreased mobility;Decreased safety awareness;Decreased knowledge of use of DME;Decreased cognition       PT Treatment Interventions DME instruction;Gait training;Functional mobility training;Therapeutic activities;Therapeutic exercise;Patient/family education;Cognitive remediation;Neuromuscular re-education;Balance training    PT Goals (Current goals can be found in the Care Plan section)  Acute Rehab PT Goals Patient Stated Goal: return home PT Goal Formulation: With patient Time For Goal Achievement: 07/20/23 Potential to Achieve Goals: Fair    Frequency Min 2X/week     Co-evaluation               AM-PAC PT "6 Clicks" Mobility  Outcome Measure Help  needed turning from your back to your side while in a flat bed without using bedrails?: A Lot Help needed moving from lying on your back to sitting on the side of a flat bed without using bedrails?: A Lot Help needed moving to and from a bed to a chair (including a wheelchair)?: A Lot Help needed standing up from a chair using your arms (e.g., wheelchair or bedside chair)?: Total Help needed to walk in hospital room?: Total Help needed climbing 3-5 steps with a railing? : Total 6 Click Score: 9    End of Session Equipment Utilized During Treatment: Gait belt;Oxygen Activity Tolerance: Patient limited by fatigue Patient left: in chair;with call bell/phone within reach;with chair alarm set;with nursing/sitter in room Nurse Communication: Mobility status PT Visit Diagnosis: Unsteadiness on feet (R26.81);Other abnormalities of gait and mobility (R26.89);Difficulty in walking, not elsewhere classified (R26.2);Muscle weakness (generalized) (M62.81)    Time: 6045-4098 PT Time Calculation (min) (ACUTE ONLY): 21 min   Charges:   PT Evaluation $PT Eval Moderate Complexity: 1 Mod   PT General Charges $$ ACUTE PT VISIT: 1 Visit         [name], PT Acute Rehabilitation Services Office: [phone]   Darlene [name] 07/06/2023, 9:18 AM

## 2023-07-07 ENCOUNTER — Inpatient Hospital Stay (HOSPITAL_COMMUNITY)

## 2023-07-07 DIAGNOSIS — T17908A Unspecified foreign body in respiratory tract, part unspecified causing other injury, initial encounter: Secondary | ICD-10-CM

## 2023-07-07 DIAGNOSIS — K922 Gastrointestinal hemorrhage, unspecified: Secondary | ICD-10-CM | POA: Diagnosis not present

## 2023-07-07 DIAGNOSIS — I4891 Unspecified atrial fibrillation: Secondary | ICD-10-CM | POA: Diagnosis not present

## 2023-07-07 DIAGNOSIS — J9601 Acute respiratory failure with hypoxia: Secondary | ICD-10-CM | POA: Diagnosis not present

## 2023-07-07 LAB — BASIC METABOLIC PANEL WITH GFR
Anion gap: 9 (ref 5–15)
BUN: 19 mg/dL (ref 8–23)
CO2: 30 mmol/L (ref 22–32)
Calcium: 9.2 mg/dL (ref 8.9–10.3)
Chloride: 103 mmol/L (ref 98–111)
Creatinine, Ser: 0.72 mg/dL (ref 0.44–1.00)
GFR, Estimated: >60 mL/min (ref >60)
Glucose, Bld: 163 mg/dL — ABNORMAL HIGH (ref 70–99)
Potassium: 3.9 mmol/L (ref 3.5–5.1)
Sodium: 142 mmol/L (ref 135–145)

## 2023-07-07 LAB — URINALYSIS, ROUTINE W REFLEX MICROSCOPIC
Bilirubin Urine: NEGATIVE
Glucose, UA: NEGATIVE mg/dL
Ketones, ur: NEGATIVE mg/dL
Leukocytes,Ua: NEGATIVE
Nitrite: NEGATIVE
Protein, ur: NEGATIVE mg/dL
Specific Gravity, Urine: 1.012 (ref 1.005–1.030)
pH: 5 (ref 5.0–8.0)

## 2023-07-07 LAB — CBC
HCT: 30.2 % — ABNORMAL LOW (ref 36.0–46.0)
Hemoglobin: 9.7 g/dL — ABNORMAL LOW (ref 12.0–15.0)
MCH: 32.7 pg (ref 26.0–34.0)
MCHC: 32.1 g/dL (ref 30.0–36.0)
MCV: 101.7 fL — ABNORMAL HIGH (ref 80.0–100.0)
Platelets: 149 10*3/uL — ABNORMAL LOW (ref 150–400)
RBC: 2.97 MIL/uL — ABNORMAL LOW (ref 3.87–5.11)
RDW: 13.8 % (ref 11.5–15.5)
WBC: 6.7 10*3/uL (ref 4.0–10.5)
nRBC: 0 % (ref 0.0–0.2)

## 2023-07-07 LAB — GLUCOSE, CAPILLARY
Glucose-Capillary: 118 mg/dL — ABNORMAL HIGH (ref 70–99)
Glucose-Capillary: 139 mg/dL — ABNORMAL HIGH (ref 70–99)
Glucose-Capillary: 149 mg/dL — ABNORMAL HIGH (ref 70–99)
Glucose-Capillary: 160 mg/dL — ABNORMAL HIGH (ref 70–99)
Glucose-Capillary: 178 mg/dL — ABNORMAL HIGH (ref 70–99)
Glucose-Capillary: 178 mg/dL — ABNORMAL HIGH (ref 70–99)

## 2023-07-07 LAB — PHOSPHORUS: Phosphorus: 3 mg/dL (ref 2.5–4.6)

## 2023-07-07 LAB — MAGNESIUM: Magnesium: 1.9 mg/dL (ref 1.7–2.4)

## 2023-07-07 MED ORDER — INSULIN ASPART 100 UNIT/ML IJ SOLN
0.0000 [IU] | INTRAMUSCULAR | Status: DC
  Administered 2023-07-07 (×2): 2 [IU] via SUBCUTANEOUS
  Administered 2023-07-07 – 2023-07-08 (×2): 1 [IU] via SUBCUTANEOUS
  Administered 2023-07-08: 2 [IU] via SUBCUTANEOUS

## 2023-07-07 MED ORDER — FUROSEMIDE 10 MG/ML IJ SOLN
40.0000 mg | Freq: Once | INTRAMUSCULAR | Status: AC
  Administered 2023-07-07: 40 mg via INTRAVENOUS
  Filled 2023-07-07: qty 4

## 2023-07-07 MED ORDER — METOLAZONE 5 MG PO TABS
2.5000 mg | ORAL_TABLET | Freq: Once | ORAL | Status: AC
Start: 2023-07-07 — End: 2023-07-07
  Administered 2023-07-07: 2.5 mg
  Filled 2023-07-07: qty 1

## 2023-07-07 MED ORDER — FUROSEMIDE 10 MG/ML IJ SOLN
80.0000 mg | Freq: Once | INTRAMUSCULAR | Status: AC
  Administered 2023-07-07: 80 mg via INTRAVENOUS
  Filled 2023-07-07: qty 8

## 2023-07-07 MED ORDER — MAGNESIUM OXIDE -MG SUPPLEMENT 400 (240 MG) MG PO TABS
400.0000 mg | ORAL_TABLET | Freq: Every day | ORAL | Status: AC
  Administered 2023-07-08: 400 mg
  Filled 2023-07-07: qty 1

## 2023-07-07 MED ORDER — MAGNESIUM SULFATE 2 GM/50ML IV SOLN
2.0000 g | Freq: Once | INTRAVENOUS | Status: AC
  Administered 2023-07-07: 2 g via INTRAVENOUS
  Filled 2023-07-07: qty 50

## 2023-07-07 MED ORDER — MAGNESIUM OXIDE -MG SUPPLEMENT 400 (240 MG) MG PO TABS
400.0000 mg | ORAL_TABLET | Freq: Every day | ORAL | Status: DC
  Administered 2023-07-07: 400 mg via ORAL
  Filled 2023-07-07: qty 1

## 2023-07-07 NOTE — Progress Notes (Signed)
 [facility] ADULT ICU REPLACEMENT PROTOCOL   The patient does apply for the [facility] Adult ICU Electrolyte Replacment Protocol based on the criteria listed below:   1.Exclusion criteria: TCTS, ECMO, Dialysis, and Myasthenia Gravis patients 2. Is GFR >/= 30 ml/min? Yes.    Patient's GFR today is >60 3. Is SCr </= 2? Yes.   Patient's SCr is 0.72 mg/dL 4. Did SCr increase >/= 0.5 in 24 hours? No. 5.Pt's weight >40kg  Yes.   6. Abnormal electrolyte(s): Mg = 1.9  7. Electrolytes replaced per protocol 8.  Call MD STAT for K+ </= 2.5, Phos </= 1, or Mag </= 1 Physician:  Iran Manna, eMD   Jeffry Minister 07/07/2023 5:43 AM

## 2023-07-07 NOTE — Progress Notes (Signed)
 NAME:  Darlene [name], MRN:  [id number], DOB:  1940/08/05, LOS: 7 ADMISSION DATE:  06/29/2023, CONSULTATION DATE:  06/30/2023 REFERRING MD: TRH, CHIEF COMPLAINT: afib/flutter RVR    History of Present Illness:  Darlene [name] is a 83 y.o. F with PMH significant for atrial fibrillation on eliquis , OSA on CPAP, HTN, HL, GERD and esophageal stricture who presented to the ED 4/15 with coffee ground emesis.  This initially began about one week before discharge and she was supposed to follow up for a scope outpatient for this, but was told this would need to be done inpatient.  CTA abd/pelvis did not reveal any acute source of bleeding, did show high-grade stenosis involving celiac access with collateral flow from the SMA.  Patient is already scheduled for hiatal hernia and spigelian hernia repair with Dr. Jamse Mcgee in 07/2023.  Pt has not required any transfusions    She was admitted and the plan was to head to EGD and then be discharged, however she has been NPO and her rate controlling agents were held and pt developed Afib with RVR and hypoxia/hypercarbia.  She was placed on bipap and given her recent emesis PCCM was consulted.  Family agreed to intubation and pt started on a cardizem  gtt   Pertinent  Medical History  Anxiety, Chronic lower back pain, Depression, DJD (degenerative joint disease), GERD (gastroesophageal reflux disease), and Hypertension.   Significant Hospital Events: Including procedures, antibiotic start and stop dates in addition to other pertinent events   4/15 admit with coffee ground emesis for inpatient EGD 4/16 afib with RVR, concern for pulm edema, PCCM consult and ICU txfr 4/17: ETT air leak, fixed with more balloon inflation. On Precedex  and Fentanyl  drips for sedation. Controlled Afib 4/21 Complaints of epigastric pain vs delirium overnight, still on amio gtt/heparin . Tube feeds paused this early this morning.   4/22 Delirium improving, patient had BM, Pulmonary Edema vs BL  infiltrates on CXR-, CVP 18 giving lasix  and metolazone   Interim History / Subjective:  Still on HHFNC, delirium seems to be improving CVP 18  Daughter at beside   Objective   Blood pressure 125/88, pulse 94, temperature 100.3 F (37.9 C), temperature source Rectal, resp. rate (!) 24, height 5\' 10"  (1.778 m), weight 98 kg, SpO2 90%. CVP:  [8 mmHg-18 mmHg] 18 mmHg  FiO2 (%):  [90 %] 90 %   Intake/Output Summary (Last 24 hours) at 07/07/2023 4098 Last data filed at 07/07/2023 0600 Gross per 24 hour  Intake 1363.96 ml  Output 375 ml  Net 988.96 ml   Filed Weights   06/30/23 1316 07/04/23 0544 07/05/23 0522  Weight: 95.7 kg 99.5 kg 98 kg    Examination: General: acute on chronic elderly female, lying in ICU bed on HHFNC in no distress HEENT: Normocephalic, PERRLA intact, missing teeth, pink moist MM, HHFNC in nares CV: s1,s2, afib- HR rate 90s, no MRG, no JVD  Pulm: clear throughout diminished in lower bases, no distress Abdominal: Bowel sounds active, soft/core track in place,BM this AM, abdominal pain improving  Skin: No rash Neuro: RASS 0 to -1, AOx4, intermittent periods of delirium   GU: purewick, making urine   Resolved Hospital Problem list   Lactic acidosis Sedation related hypotension Constipation   Assessment & Plan:  Acute hypoxic and hypercapnic respiratory failure- secondary to aspiration from large hiatal hernia Extubated to [facility] yesterday, not much success with diuresis, CVPs also not very high  Chest X-ray on 4/20-showing unchanged pulmonary lower lobe opacities  Zosyn  on 4/15 to 4/18  Fever trend increasing on 4/20  4/22 still requiring high amounts of FiO2 via HHFNC but O2 sats seem to be improving slowly 4/23 Pulmonary edema vs BL infiltrates on chest x-ray , CVP 18  P:  Continue ventilator support and lung protective strategies  Wean Fio2 requirements to sat goal of >88%, continue to wean off HHFNC HOB > 30 degrees Plat pressures < 30  Intermittent  Chest X-ray and ABGS Obtain and follow cultures-blood and tracheal aspirate Continue IS and Flutter valve Continue to mobilize out of bed daily, up to chair  Received a total 120 mg, and Metolazone  2.5mg   Continue to trend CVP q4 and also Monitor urine output   Concern for Delirium 4/22 on exam, patient alert and oriented x 4 Per daughter patient rested better last night P:  Continue delirium precautions Continue Lexapro   Continue to trend sodium daily, sodium in normal range will dc free water  flushes  Continue adequate pain control- continue tramadol  PRN   Urinary retention Foley removed on 4/21 Had to be straight cathed at 0600 on 4/22-obtained 450 out 4/23- after administration of diuretics, 850 ml urinary output  P: Continue to Bladder scan every 6 hours Continue Intermittent/straight cath protocol  Monitor urinary output via purewick   Fever Trending up, zosyn  stopped on 4/18  Still showing lower lobe opacities on chest X-ray on 4/20  No leukocytosis  4/22-started ceftriaxone  4/23 Fever controlled  P:  To ceftriaxone  for total of 5 days Will check UA per daughters request, concerned that her mom (patient) might Be developing UTI  Continue to trend WBCs/fever curve   Hiatal hernia with oozing cameron erosions leading to UGIB- was planned for OP repair Upper EGD 4/17 revealed large hiatal hernia with a oozing Cameron erosions.. Large amount of food (residue) and coffee ground material was observed in the hiatal hernia portion of the stomach.  -Tentative plan for surgical correction of hiatal hernia with Dr. Lanell Pinta 08/03/2023 Still complaining of abdominal pain overnight with some epigastric pain but seems that pain is better controlled since adding tramadol  as needed 4/23 Abdominal pain controlled with pain regimen, patient had BM this morning  P:  Continue tube feedings via Cortrak  Appreciate RD recs   Hypoglycemia CBG 61 at 330 this morning, received one half amp  of D50 P: Continue tube feed, achieve goal per RD recs Increase SSI to sensitive Continue to monitor CBGs q4 hrs   Hypernatremia  Had been receiving diuresis with lasix - with minimal output 4/22 sodium 145 from 147 4/23 sodium 142  P: Continue to trend sodium daily per BMET  DC free water  flushes   Afib on AC PTA 4/21 metoprolol  per tube added 4/23 Added cardizem  30mg  q8 hr, Afib rate more controlled  P: Continue eliquis  per tube  Continue cardizem  and metoprolol  per tube Continue amio gtt- if Afib rate is appropriately controlled, can transition off Amio gtt to oral on 4/24  Continue Cardiac Tele   Hx HTN  Patient had been on levophed , but stopped on 4/19  P:  Continue metoprolol  and cardizem  as above Continue to monitor BP  Continue cardiac tele   Best Practice (right click and "Reselect all SmartList Selections" daily)   Diet/type: TF DVT prophylaxis heparin  gtt-transition to Eliquis  4/22 Pressure ulcer(s): N/A GI prophylaxis: PPI Lines: Central line Foley:  Yes, and it is still needed Code Status:  full code Last date of multidisciplinary goals of care discussion [updated family-daughter at bedside  on 4/23  Rey Catholic AGACNP-BC   [facility] 07/07/2023, 9:54 AM  Please see [email or web address] for pager details.  From 7A-7P if no response, please call [phone]. After hours, please call ELink [phone].

## 2023-07-07 NOTE — Evaluation (Signed)
 Occupational Therapy Evaluation Patient Details Name: Darlene [name] MRN: [id number] DOB: 18-Feb-1941 Today's Date: 07/07/2023   History of Present Illness   83 yo female admitted 06/29/23 with acute hypoxic hypercapnic respiratory failure in the setting of aspiration complicated by GI bleed. Intubated 4/16-4/20. 4/17 EGD. PMhx: depression, DJD, OSA, pulmonary HTN, obesity, HLD, Afib     Clinical Impressions Pt reports living with her daughter, states she uses a cane for mobility and is ind with ADLs, daughter assists with IADLs. Pt currently needing min-max A for ADLs, mod A for bed mobility and mod +2 for pivot transfer with RW. Pt alert upon arrival, but becoming more lethargic/fatigued while mobilizing BP WNL (see below). Pt incontinent of urine upon standing, also nauseas/dry heaving while sitting EOB, RN present to assess. Pt presenting with impairments listed below, will follow acutely. Patient will benefit from continued inpatient follow up therapy, <3 hours/day to maximize safety/ind with ADL/functional mobility.   SpO2 high 80s-mid 90s when pleth reading well on 45L 90% FiO2 HHFNC  BP seated EOB 104/84 (92) BP pre chair transfer 122/79 (79) BP after chair transfer 127/76 (89)      If plan is discharge home, recommend the following:   Two people to help with walking and/or transfers;A lot of help with bathing/dressing/bathroom;Assistance with cooking/housework;Assist for transportation;Help with stairs or ramp for entrance;Direct supervision/assist for medications management;Direct supervision/assist for financial management     Functional Status Assessment   Patient has had a recent decline in their functional status and demonstrates the ability to make significant improvements in function in a reasonable and predictable amount of time.     Equipment Recommendations   BSC/3in1     Recommendations for Other Services   PT consult     Precautions/Restrictions    Precautions Precautions: Fall;Other (comment) Recall of Precautions/Restrictions: Impaired Precaution/Restrictions Comments: HHFNC, watch SPO2, cortrak, incontinent Restrictions Weight Bearing Restrictions Per Provider Order: No     Mobility Bed Mobility Overal bed mobility: Needs Assistance Bed Mobility: Supine to Sit     Supine to sit: Mod assist     General bed mobility comments: mod A trunk elevation    Transfers Overall transfer level: Needs assistance   Transfers: Sit to/from Stand, Bed to chair/wheelchair/BSC Sit to Stand: Mod assist, +2 physical assistance     Step pivot transfers: Mod assist, +2 physical assistance     General transfer comment: pt incontinent with initial stand, then able to transfer to chair with mod +2      Balance Overall balance assessment: Needs assistance Sitting-balance support: No upper extremity supported, Feet supported Sitting balance-Leahy Scale: Fair Sitting balance - Comments: CGA-minA  at EOB   Standing balance support: Bilateral upper extremity supported, Reliant on assistive device for balance, During functional activity Standing balance-Leahy Scale: Poor Standing balance comment: reliant on external support                           ADL either performed or assessed with clinical judgement   ADL Overall ADL's : Needs assistance/impaired Eating/Feeding: Minimal assistance;Sitting   Grooming: Minimal assistance;Sitting;Oral care;Brushing hair Grooming Details (indicate cue type and reason): assist 2/2 fatigue Upper Body Bathing: Moderate assistance;Sitting   Lower Body Bathing: Moderate assistance Lower Body Bathing Details (indicate cue type and reason): washing upper legs/peri area after urination Upper Body Dressing : Moderate assistance;Sitting   Lower Body Dressing: Maximal assistance;Sitting/lateral leans Lower Body Dressing Details (indicate cue type and reason): donning socks  EOB Toilet Transfer:  Moderate assistance;+2 for physical assistance;Stand-pivot;Rolling walker (2 wheels);BSC/3in1   Toileting- Clothing Manipulation and Hygiene: Maximal assistance Toileting - Clothing Manipulation Details (indicate cue type and reason): purewick     Functional mobility during ADLs: Moderate assistance;+2 for physical assistance;Rolling walker (2 wheels)       Vision   Additional Comments: not formally assessed     Perception         Praxis         Pertinent Vitals/Pain Pain Assessment Pain Assessment: No/denies pain     Extremity/Trunk Assessment Upper Extremity Assessment Upper Extremity Assessment: Generalized weakness;Right hand dominant   Lower Extremity Assessment Lower Extremity Assessment: Defer to PT evaluation   Cervical / Trunk Assessment Cervical / Trunk Assessment: Kyphotic   Communication Communication Communication: No apparent difficulties   Cognition Arousal: Alert, Lethargic Behavior During Therapy: WFL for tasks assessed/performed Cognition: No family/caregiver present to determine baseline             OT - Cognition Comments: Pt provides home set up info and PLOF accurately, pt initially alert, but becoming more lethargic/fatigued with mobility, needing more cues/time to follow commands                 Following commands: Impaired Following commands impaired: Follows one step commands with increased time     Cueing  General Comments   Cueing Techniques: Verbal cues;Gestural cues;Tactile cues  see note for BP measures, pt SpO2 70s/80s with poor wave pleth, when reading well between 85-low 90s on 45L 90% FiO2 HHFNC, pt dry heaving upon sitting EOB, RN present in room, subsided after a few mins   Exercises     Shoulder Instructions      Home Living Family/patient expects to be discharged to:: Private residence Living Arrangements: Children Available Help at Discharge: Family;Available 24 hours/day Type of Home: House Home  Access: Level entry     Home Layout: Two level;Able to live on main level with bedroom/bathroom     Bathroom Shower/Tub: Chief Strategy Officer: Standard     Home Equipment: Agricultural consultant (2 wheels);Cane - single point;Shower seat          Prior Functioning/Environment               Mobility Comments: walks with cane in the community, sometimes amb without cane for short distances ADLs Comments: pt reports ind with ADL, daughter assists w/ med mgmt and IADL tasks    OT Problem List: Decreased strength;Decreased range of motion;Decreased activity tolerance;Impaired balance (sitting and/or standing);Decreased coordination;Decreased cognition;Decreased safety awareness;Cardiopulmonary status limiting activity;Decreased knowledge of precautions;Decreased knowledge of use of DME or AE   OT Treatment/Interventions: Self-care/ADL training;Therapeutic exercise;DME and/or AE instruction;Energy conservation;Therapeutic activities;Balance training;Patient/family education;Cognitive remediation/compensation;Visual/perceptual remediation/compensation      OT Goals(Current goals can be found in the care plan section)   Acute Rehab OT Goals Patient Stated Goal: did not state OT Goal Formulation: With patient Time For Goal Achievement: 07/21/23 Potential to Achieve Goals: Good ADL Goals Pt Will Perform Grooming: standing;with contact guard assist Pt Will Perform Upper Body Dressing: with contact guard assist;sitting Pt Will Perform Lower Body Dressing: with min assist;sitting/lateral leans;sit to/from stand Pt Will Transfer to Toilet: with min assist;ambulating;regular height toilet Additional ADL Goal #1: Pt will tolerate standing activity x5 min in order to improve activity tolerance for ADLs   OT Frequency:  Min 2X/week    Co-evaluation  AM-PAC OT "6 Clicks" Daily Activity     Outcome Measure Help from another person eating meals?: A Little Help  from another person taking care of personal grooming?: A Little Help from another person toileting, which includes using toliet, bedpan, or urinal?: A Lot Help from another person bathing (including washing, rinsing, drying)?: A Lot Help from another person to put on and taking off regular upper body clothing?: A Lot Help from another person to put on and taking off regular lower body clothing?: A Lot 6 Click Score: 14   End of Session Equipment Utilized During Treatment: Gait belt;Rolling walker (2 wheels);Oxygen (45L 90% FiO2 HHFNC) Nurse Communication: Mobility status (purewick out and bed needed to be changed)  Activity Tolerance: Patient limited by fatigue Patient left: in chair;with call bell/phone within reach;with chair alarm set  OT Visit Diagnosis: Unsteadiness on feet (R26.81);Other abnormalities of gait and mobility (R26.89);Muscle weakness (generalized) (M62.81)                Time: 1610-9604 OT Time Calculation (min): 40 min Charges:  OT General Charges $OT Visit: 1 Visit OT Evaluation $OT Eval Moderate Complexity: 1 Mod OT Treatments $Self Care/Home Management : 8-22 mins $Therapeutic Activity: 8-22 mins  Criselda Starke K, OTD, OTR/L SecureChat Preferred Acute Rehab [phone]   Benedict Brain Koonce 07/07/2023, 2:55 PM

## 2023-07-08 DIAGNOSIS — T17908A Unspecified foreign body in respiratory tract, part unspecified causing other injury, initial encounter: Secondary | ICD-10-CM | POA: Diagnosis not present

## 2023-07-08 DIAGNOSIS — I4891 Unspecified atrial fibrillation: Secondary | ICD-10-CM | POA: Diagnosis not present

## 2023-07-08 DIAGNOSIS — J9601 Acute respiratory failure with hypoxia: Secondary | ICD-10-CM | POA: Diagnosis not present

## 2023-07-08 DIAGNOSIS — K922 Gastrointestinal hemorrhage, unspecified: Secondary | ICD-10-CM | POA: Diagnosis not present

## 2023-07-08 LAB — GLUCOSE, CAPILLARY
Glucose-Capillary: 151 mg/dL — ABNORMAL HIGH (ref 70–99)
Glucose-Capillary: 140 mg/dL — ABNORMAL HIGH (ref 70–99)
Glucose-Capillary: 151 mg/dL — ABNORMAL HIGH (ref 70–99)
Glucose-Capillary: 87 mg/dL (ref 70–99)
Glucose-Capillary: 150 mg/dL — ABNORMAL HIGH (ref 70–99)
Glucose-Capillary: 146 mg/dL — ABNORMAL HIGH (ref 70–99)

## 2023-07-08 LAB — CBC
HCT: 31.6 % — ABNORMAL LOW (ref 36.0–46.0)
Hemoglobin: 10.1 g/dL — ABNORMAL LOW (ref 12.0–15.0)
MCH: 31.7 pg (ref 26.0–34.0)
MCHC: 32 g/dL (ref 30.0–36.0)
MCV: 99.1 fL (ref 80.0–100.0)
Platelets: 183 10*3/uL (ref 150–400)
RBC: 3.19 MIL/uL — ABNORMAL LOW (ref 3.87–5.11)
RDW: 13.5 % (ref 11.5–15.5)
WBC: 11.4 10*3/uL — ABNORMAL HIGH (ref 4.0–10.5)
nRBC: 0 % (ref 0.0–0.2)

## 2023-07-08 LAB — BASIC METABOLIC PANEL WITH GFR
Anion gap: 11 (ref 5–15)
BUN: 17 mg/dL (ref 8–23)
CO2: 34 mmol/L — ABNORMAL HIGH (ref 22–32)
Calcium: 9.1 mg/dL (ref 8.9–10.3)
Chloride: 96 mmol/L — ABNORMAL LOW (ref 98–111)
Creatinine, Ser: 0.61 mg/dL (ref 0.44–1.00)
GFR, Estimated: >60 mL/min (ref >60)
Glucose, Bld: 158 mg/dL — ABNORMAL HIGH (ref 70–99)
Potassium: 3.1 mmol/L — ABNORMAL LOW (ref 3.5–5.1)
Sodium: 141 mmol/L (ref 135–145)

## 2023-07-08 LAB — PHOSPHORUS: Phosphorus: 3.4 mg/dL (ref 2.5–4.6)

## 2023-07-08 LAB — MAGNESIUM: Magnesium: 1.8 mg/dL (ref 1.7–2.4)

## 2023-07-08 MED ORDER — LIDOCAINE 5 % EX PTCH
1.0000 | MEDICATED_PATCH | CUTANEOUS | Status: DC
  Administered 2023-07-08 – 2023-07-29 (×20): 1 via TRANSDERMAL
  Filled 2023-07-08 (×21): qty 1

## 2023-07-08 MED ORDER — POTASSIUM CHLORIDE 10 MEQ/100ML IV SOLN
10.0000 meq | INTRAVENOUS | Status: AC
Start: 2023-07-08 — End: 2023-07-08
  Administered 2023-07-08 (×4): 10 meq via INTRAVENOUS
  Filled 2023-07-08 (×4): qty 100

## 2023-07-08 MED ORDER — MELATONIN 3 MG PO TABS
3.0000 mg | ORAL_TABLET | Freq: Every day | ORAL | Status: DC
  Administered 2023-07-08 – 2023-07-22 (×15): 3 mg
  Filled 2023-07-08 (×15): qty 1

## 2023-07-08 MED ORDER — MAGNESIUM SULFATE 4 GM/100ML IV SOLN
4.0000 g | Freq: Once | INTRAVENOUS | Status: AC
Start: 2023-07-08 — End: 2023-07-08
  Administered 2023-07-08: 4 g via INTRAVENOUS
  Filled 2023-07-08: qty 100

## 2023-07-08 MED ORDER — SODIUM CHLORIDE 3 % IN NEBU
4.0000 mL | INHALATION_SOLUTION | Freq: Two times a day (BID) | RESPIRATORY_TRACT | Status: AC
  Administered 2023-07-08 – 2023-07-10 (×6): 4 mL via RESPIRATORY_TRACT
  Filled 2023-07-08 (×6): qty 15

## 2023-07-08 MED ORDER — POTASSIUM CHLORIDE 20 MEQ PO PACK
40.0000 meq | PACK | Freq: Once | ORAL | Status: AC
  Administered 2023-07-08: 40 meq
  Filled 2023-07-08: qty 2

## 2023-07-08 NOTE — Plan of Care (Signed)
  Problem: Education: Goal: Knowledge of General Education information will improve Description: Including pain rating scale, medication(s)/side effects and non-pharmacologic comfort measures Outcome: Progressing   Problem: Health Behavior/Discharge Planning: Goal: Ability to manage health-related needs will improve Outcome: Progressing   Problem: Clinical Measurements: Goal: Ability to maintain clinical measurements within normal limits will improve Outcome: Progressing   Problem: Nutrition: Goal: Adequate nutrition will be maintained Outcome: Progressing   Problem: Coping: Goal: Level of anxiety will decrease Outcome: Progressing   Problem: Elimination: Goal: Will not experience complications related to bowel motility Outcome: Progressing Goal: Will not experience complications related to urinary retention Outcome: Progressing   Problem: Pain Managment: Goal: General experience of comfort will improve and/or be controlled Outcome: Progressing   Problem: Skin Integrity: Goal: Risk for impaired skin integrity will decrease Outcome: Progressing   Problem: Cardiac: Goal: Ability to achieve and maintain adequate cardiopulmonary perfusion will improve Outcome: Progressing

## 2023-07-08 NOTE — Progress Notes (Signed)
 NAME:  Darlene [name], MRN:  [id number], DOB:  10-23-1940, LOS: 8 ADMISSION DATE:  06/29/2023, CONSULTATION DATE:  06/30/2023 REFERRING MD: TRH, CHIEF COMPLAINT: afib/flutter RVR    History of Present Illness:  Darlene [name] is a 83 y.o. F with PMH significant for atrial fibrillation on eliquis , OSA on CPAP, HTN, HL, GERD and esophageal stricture who presented to the ED 4/15 with coffee ground emesis.  This initially began about one week before discharge and she was supposed to follow up for a scope outpatient for this, but was told this would need to be done inpatient.  CTA abd/pelvis did not reveal any acute source of bleeding, did show high-grade stenosis involving celiac access with collateral flow from the SMA.  Patient is already scheduled for hiatal hernia and spigelian hernia repair with Dr. Jamse Mcgee in 07/2023.  Pt has not required any transfusions    She was admitted and the plan was to head to EGD and then be discharged, however she has been NPO and her rate controlling agents were held and pt developed Afib with RVR and hypoxia/hypercarbia.  She was placed on bipap and given her recent emesis PCCM was consulted.  Family agreed to intubation and pt started on a cardizem  gtt   Pertinent  Medical History  Anxiety, Chronic lower back pain, Depression, DJD (degenerative joint disease), GERD (gastroesophageal reflux disease), and Hypertension.   Significant Hospital Events: Including procedures, antibiotic start and stop dates in addition to other pertinent events   4/15 admit with coffee ground emesis for inpatient EGD 4/16 afib with RVR, concern for pulm edema, PCCM consult and ICU txfr 4/17: ETT air leak, fixed with more balloon inflation. On Precedex  and Fentanyl  drips for sedation. Controlled Afib 4/21 Complaints of epigastric pain vs delirium overnight, still on amio gtt/heparin . Tube feeds paused this early this morning.   4/22 Delirium improving, patient had BM, Pulmonary Edema vs BL  infiltrates on CXR-, CVP 18 giving lasix  and metolazone   Interim History / Subjective:   Remains critically ill on HHFNC 90% / 45 L. Afebrile Good cough Some sundowning confusion described by daughter at bedside Diuresed 3.5 L with metolazone /Lasix    Objective   Blood pressure 127/68, pulse (!) 104, temperature 98.2 F (36.8 C), temperature source Oral, resp. rate (!) 27, height 5\' 10"  (1.778 m), weight 98 kg, SpO2 95%. CVP:  [7 mmHg-13 mmHg] 10 mmHg  FiO2 (%):  [89 %-91 %] 91 %   Intake/Output Summary (Last 24 hours) at 07/08/2023 0954 Last data filed at 07/08/2023 0700 Gross per 24 hour  Intake 1176.29 ml  Output 2950 ml  Net -1773.71 ml   Filed Weights   06/30/23 1316 07/04/23 0544 07/05/23 0522  Weight: 95.7 kg 99.5 kg 98 kg    Examination: General: acute on chronic elderly female, lying in ICU bed on HHFNC in no distress HEENT: Normocephalic, PERRLA intact, missing teeth, pink moist MM, HHFNC in nares CV: s1,s2, afib- HR rate 90s, no MRG, no JVD  Pulm: Right basal crackles, no accessory muscle use Abdominal: Soft, nontender Skin: No rash Neuro: RASS+1, AOx4, intermittent periods of confusion reported GU: purewick, making urine   Resolved Hospital Problem list   Lactic acidosis Sedation related hypotension Constipation   Assessment & Plan:  Acute hypoxic and hypercapnic respiratory failure- secondary to aspiration from large hiatal hernia Extubated to [facility] 4/20 Zosyn  on 4/15 to 4/18 , ceftx 4/22 >>  P:  Dial down oxygen on HHFNC Continue ceftriaxone  Tracheobronchial toilet with hypertonic  saline nebs, flutter valve, chest PT and mobilization  ICU delirium P:  Continue delirium precautions, frequent mentation, mobilization Continue Lexapro    Continue adequate pain control with tramadol  PRN   Urinary retention Foley removed on 4/21 Had to be straight cathed at 0600 on 4/22-obtained 450 out  P: Continue Intermittent/straight cath protocol  Monitor  urinary output via purewick   Hiatal hernia with oozing cameron erosions leading to UGIB- was planned for OP repair Upper EGD 4/17 revealed large hiatal hernia with a oozing Cameron erosions.. Large amount of food (residue) and coffee ground material was observed in the hiatal hernia portion of the stomach.  -Tentative plan for surgical correction of hiatal hernia with Dr. Lanell Pinta 08/03/2023  P:  Continue tube feedings via Cortrak  Appreciate RD recs   Hyperglycemia  P: Continue tube feed, achieve goal per RD recs Monitor, DC SSI   Afib on AC PTA 4/21 metoprolol  per tube added 4/23 Added cardizem  30mg  q8 hr, Afib rate more controlled , dc amio P: Continue eliquis  per tube  Continue cardizem  and metoprolol  per tube Rate controlled off Amio drip     Best Practice (right click and "Reselect all SmartList Selections" daily)   Diet/type: TF DVT prophylaxis heparin  gtt-transition to Eliquis  4/22 Pressure ulcer(s): N/A GI prophylaxis: PPI Lines: Central line Foley:  Yes, and it is still needed Code Status:  full code Last date of multidisciplinary goals of care discussion [updated family-daughter at bedside on 4/24  07/08/2023, 9:54 AM  Alphonzo Ask V. Villa Greaser MD Please see [email or web address] for pager details.  From 7A-7P if no response, please call [phone]. After hours, please call ELink [phone].

## 2023-07-09 ENCOUNTER — Inpatient Hospital Stay (HOSPITAL_COMMUNITY)

## 2023-07-09 DIAGNOSIS — T17908A Unspecified foreign body in respiratory tract, part unspecified causing other injury, initial encounter: Secondary | ICD-10-CM | POA: Diagnosis not present

## 2023-07-09 DIAGNOSIS — J9602 Acute respiratory failure with hypercapnia: Secondary | ICD-10-CM | POA: Diagnosis not present

## 2023-07-09 DIAGNOSIS — J9601 Acute respiratory failure with hypoxia: Secondary | ICD-10-CM | POA: Diagnosis not present

## 2023-07-09 LAB — CBC
HCT: 32.6 % — ABNORMAL LOW (ref 36.0–46.0)
Hemoglobin: 10.6 g/dL — ABNORMAL LOW (ref 12.0–15.0)
MCH: 31.8 pg (ref 26.0–34.0)
MCHC: 32.5 g/dL (ref 30.0–36.0)
MCV: 97.9 fL (ref 80.0–100.0)
Platelets: 221 10*3/uL (ref 150–400)
RBC: 3.33 MIL/uL — ABNORMAL LOW (ref 3.87–5.11)
RDW: 13.5 % (ref 11.5–15.5)
WBC: 15.4 10*3/uL — ABNORMAL HIGH (ref 4.0–10.5)
nRBC: 0 % (ref 0.0–0.2)

## 2023-07-09 LAB — BASIC METABOLIC PANEL WITH GFR
Anion gap: 10 (ref 5–15)
BUN: 17 mg/dL (ref 8–23)
CO2: 35 mmol/L — ABNORMAL HIGH (ref 22–32)
Calcium: 9.2 mg/dL (ref 8.9–10.3)
Chloride: 94 mmol/L — ABNORMAL LOW (ref 98–111)
Creatinine, Ser: 0.6 mg/dL (ref 0.44–1.00)
GFR, Estimated: >60 mL/min (ref >60)
Glucose, Bld: 162 mg/dL — ABNORMAL HIGH (ref 70–99)
Potassium: 3.8 mmol/L (ref 3.5–5.1)
Sodium: 139 mmol/L (ref 135–145)

## 2023-07-09 LAB — MAGNESIUM: Magnesium: 1.8 mg/dL (ref 1.7–2.4)

## 2023-07-09 MED ORDER — MAGNESIUM SULFATE 2 GM/50ML IV SOLN
2.0000 g | Freq: Once | INTRAVENOUS | Status: AC
  Administered 2023-07-09: 2 g via INTRAVENOUS
  Filled 2023-07-09: qty 50

## 2023-07-09 MED ORDER — PIPERACILLIN-TAZOBACTAM 3.375 G IVPB
3.3750 g | Freq: Three times a day (TID) | INTRAVENOUS | Status: DC
  Administered 2023-07-09 – 2023-07-13 (×13): 3.375 g via INTRAVENOUS
  Filled 2023-07-09 (×13): qty 50

## 2023-07-09 MED ORDER — FUROSEMIDE 10 MG/ML IJ SOLN
80.0000 mg | Freq: Once | INTRAMUSCULAR | Status: AC
  Administered 2023-07-09: 80 mg via INTRAVENOUS
  Filled 2023-07-09: qty 8

## 2023-07-09 MED ORDER — METOLAZONE 5 MG PO TABS
2.5000 mg | ORAL_TABLET | Freq: Once | ORAL | Status: AC
  Administered 2023-07-09: 2.5 mg
  Filled 2023-07-09: qty 1

## 2023-07-09 MED ORDER — POTASSIUM CHLORIDE 20 MEQ PO PACK
40.0000 meq | PACK | Freq: Once | ORAL | Status: AC
  Administered 2023-07-09: 40 meq
  Filled 2023-07-09: qty 2

## 2023-07-09 NOTE — Plan of Care (Signed)
  Problem: Clinical Measurements: Goal: Will remain free from infection Outcome: Progressing Goal: Diagnostic test results will improve Outcome: Progressing Goal: Respiratory complications will improve Outcome: Progressing Goal: Cardiovascular complication will be avoided Outcome: Progressing   Problem: Nutrition: Goal: Adequate nutrition will be maintained Outcome: Progressing   Problem: Coping: Goal: Level of anxiety will decrease Outcome: Progressing

## 2023-07-09 NOTE — Progress Notes (Signed)
 RT NOTE: RT placed PT on 15L salter per CCM to trial off HHFNC. PT currently tolerating well at this time with sats of 90%. HHFNC on standby at bedside. RT will continue to monitor.

## 2023-07-09 NOTE — Progress Notes (Signed)
 NAME:  Darlene [name], MRN:  [id number], DOB:  Sep 29, 1940, LOS: 9 ADMISSION DATE:  06/29/2023, CONSULTATION DATE:  06/30/2023 REFERRING MD: TRH, CHIEF COMPLAINT: afib/flutter RVR    History of Present Illness:  Darlene [name] is a 83 y.o. F with PMH significant for atrial fibrillation on eliquis , OSA on CPAP, HTN, HL, GERD and esophageal stricture who presented to the ED 4/15 with coffee ground emesis.  This initially began about one week before discharge and she was supposed to follow up for a scope outpatient for this, but was told this would need to be done inpatient.  CTA abd/pelvis did not reveal any acute source of bleeding, did show high-grade stenosis involving celiac access with collateral flow from the SMA.  Patient is already scheduled for hiatal hernia and spigelian hernia repair with Dr. Jamse Mcgee in 07/2023.  Pt has not required any transfusions    She was admitted and the plan was to head to EGD and then be discharged, however she has been NPO and her rate controlling agents were held and pt developed Afib with RVR and hypoxia/hypercarbia.  She was placed on bipap and given her recent emesis PCCM was consulted.  Family agreed to intubation and pt started on a cardizem  gtt   Pertinent  Medical History  Anxiety, Chronic lower back pain, Depression, DJD (degenerative joint disease), GERD (gastroesophageal reflux disease), and Hypertension.   Significant Hospital Events: Including procedures, antibiotic start and stop dates in addition to other pertinent events   4/15 admit with coffee ground emesis for inpatient EGD 4/16 afib with RVR, concern for pulm edema, PCCM consult and ICU txfr 4/17: ETT air leak, fixed with more balloon inflation. On Precedex  and Fentanyl  drips for sedation. Controlled Afib 4/21 Complaints of epigastric pain vs delirium overnight, still on amio gtt/heparin . Tube feeds paused this early this morning.   4/22 Delirium improving, patient had BM, Pulmonary Edema vs BL  infiltrates on CXR-, CVP 18 -diuresed with lasix  and metolazone  4/24 Diuresed 3.5 L with metolazone /Lasix   Interim History / Subjective:   Remains critically ill, on HHFNC 90% / 45 L Afebrile Good urine output Sundowning/confusion per daughter towards evening     Objective   Blood pressure 120/67, pulse 95, temperature 99 F (37.2 C), temperature source Oral, resp. rate (!) 30, height 5\' 10"  (1.778 m), weight 98 kg, SpO2 94%. CVP:  [5 mmHg-12 mmHg] 5 mmHg  FiO2 (%):  [90 %] (P) 90 %   Intake/Output Summary (Last 24 hours) at 07/09/2023 0943 Last data filed at 07/09/2023 0700 Gross per 24 hour  Intake 3094.56 ml  Output 1600 ml  Net 1494.56 ml   Filed Weights   06/30/23 1316 07/04/23 0544 07/05/23 0522  Weight: 95.7 kg 99.5 kg 98 kg    Examination: General: acute on chronic elderly female, lying in ICU bed on HHFNC in no distress HEENT: Normocephalic, PERRLA intact, missing teeth, pink moist MM, HHFNC in nares CV: s1,s2, irregular, no murmur Pulm: No accessory muscle use, right basilar crackles Abdominal: Soft, nontender Skin: No rash Neuro: RASS+1, AOx4, intermittent periods of confusion reported GU: purewick, making urine   Labs show normal electrolytes, increased leukocytosis  Resolved Hospital Problem list   Lactic acidosis Sedation related hypotension Constipation   Assessment & Plan:  Acute hypoxic and hypercapnic respiratory failure- secondary to aspiration from large hiatal hernia Extubated to [facility] 4/20 Zosyn  on 4/15 to 4/18 , ceftx 4/22 >> , zosyn  4/25 >>  P:  Remains on high FiO2 for  several days although appears clinically improved Will attempt transition to salter Diurese again with metolazone  and Lasix  Broaden antibiotics to Zosyn  due to increasing leukocytosis Tracheobronchial toilet with hypertonic saline nebs, flutter valve, chest PT and mobilization Trial of vest  ICU delirium P:  Continue delirium precautions, frequent mentation,  mobilization Continue Lexapro , melatonin nightly Continue  pain control with tramadol  PRN   Urinary retention Foley removed on 4/21 Had to be straight cathed at 0600 on 4/22-obtained 450 out  P: Continue Intermittent/straight cath protocol  Monitor urinary output via purewick   Hiatal hernia with oozing cameron erosions leading to UGIB- was planned for OP repair Upper EGD 4/17 revealed large hiatal hernia with a oozing Cameron erosions.. Large amount of food (residue) and coffee ground material was observed in the hiatal hernia portion of the stomach.  -Tentative plan for surgical correction of hiatal hernia with Dr. Lanell Pinta 08/03/2023  P:  Continue tube feedings via Cortrak  Once hypoxia improves, consider swallow evaluation   Afib on AC PTA 4/21 metoprolol  per tube added 4/23 Added cardizem  30mg  q8 hr, Afib rate more controlled , dc amio P: Continue eliquis  per tube  Continue cardizem  and metoprolol  per tube Rate controlled off Amio drip     Best Practice (right click and "Reselect all SmartList Selections" daily)   Diet/type: TF DVT prophylaxis heparin  gtt-transition to Eliquis  4/22 Pressure ulcer(s): N/A GI prophylaxis: PPI Lines: Central line Foley:  Yes, and it is still needed Code Status:  full code Last date of multidisciplinary goals of care discussion [updated family-daughter at bedside on 4/25  07/09/2023, 9:43 AM  Alphonzo Ask V. Villa Greaser MD Please see [email or web address] for pager details.  From 7A-7P if no response, please call [phone]. After hours, please call ELink [phone].

## 2023-07-09 NOTE — Progress Notes (Signed)
 RT NOTE: PT desating to mid 80's on 15L salter. RT placed PT back on HHFNC with sats improving to 90-91%. Vitals are stable. RT will continue to monitor.

## 2023-07-09 NOTE — Plan of Care (Signed)

## 2023-07-10 ENCOUNTER — Inpatient Hospital Stay (HOSPITAL_COMMUNITY)

## 2023-07-10 ENCOUNTER — Other Ambulatory Visit: Payer: Self-pay

## 2023-07-10 DIAGNOSIS — K449 Diaphragmatic hernia without obstruction or gangrene: Secondary | ICD-10-CM | POA: Diagnosis not present

## 2023-07-10 DIAGNOSIS — J9602 Acute respiratory failure with hypercapnia: Secondary | ICD-10-CM | POA: Diagnosis not present

## 2023-07-10 DIAGNOSIS — J9601 Acute respiratory failure with hypoxia: Secondary | ICD-10-CM | POA: Diagnosis not present

## 2023-07-10 LAB — POCT I-STAT 7, (LYTES, BLD GAS, ICA,H+H)
Acid-Base Excess: 16 mmol/L — ABNORMAL HIGH (ref 0.0–2.0)
Bicarbonate: 41 mmol/L — ABNORMAL HIGH (ref 20.0–28.0)
Calcium, Ion: 1.18 mmol/L (ref 1.15–1.40)
HCT: 31 % — ABNORMAL LOW (ref 36.0–46.0)
Hemoglobin: 10.5 g/dL — ABNORMAL LOW (ref 12.0–15.0)
O2 Saturation: 64 %
Patient temperature: 98.6
Potassium: 3.5 mmol/L (ref 3.5–5.1)
Sodium: 136 mmol/L (ref 135–145)
TCO2: 43 mmol/L — ABNORMAL HIGH (ref 22–32)
pCO2 arterial: 52.2 mmHg — ABNORMAL HIGH (ref 32–48)
pH, Arterial: 7.503 — ABNORMAL HIGH (ref 7.35–7.45)
pO2, Arterial: 31 mmHg — CL (ref 83–108)

## 2023-07-10 LAB — BASIC METABOLIC PANEL WITH GFR
Anion gap: 12 (ref 5–15)
BUN: 23 mg/dL (ref 8–23)
CO2: 35 mmol/L — ABNORMAL HIGH (ref 22–32)
Calcium: 9 mg/dL (ref 8.9–10.3)
Chloride: 92 mmol/L — ABNORMAL LOW (ref 98–111)
Creatinine, Ser: 0.67 mg/dL (ref 0.44–1.00)
GFR, Estimated: >60 mL/min (ref >60)
Glucose, Bld: 160 mg/dL — ABNORMAL HIGH (ref 70–99)
Potassium: 3.6 mmol/L (ref 3.5–5.1)
Sodium: 139 mmol/L (ref 135–145)

## 2023-07-10 LAB — CBC
HCT: 32.1 % — ABNORMAL LOW (ref 36.0–46.0)
Hemoglobin: 10.8 g/dL — ABNORMAL LOW (ref 12.0–15.0)
MCH: 32.2 pg (ref 26.0–34.0)
MCHC: 33.6 g/dL (ref 30.0–36.0)
MCV: 95.8 fL (ref 80.0–100.0)
Platelets: 291 10*3/uL (ref 150–400)
RBC: 3.35 MIL/uL — ABNORMAL LOW (ref 3.87–5.11)
RDW: 13.5 % (ref 11.5–15.5)
WBC: 18.6 10*3/uL — ABNORMAL HIGH (ref 4.0–10.5)
nRBC: 0.2 % (ref 0.0–0.2)

## 2023-07-10 LAB — COOXEMETRY PANEL
Carboxyhemoglobin: 1.5 % (ref 0.5–1.5)
Methemoglobin: <0.7 % (ref 0.0–1.5)
O2 Saturation: 60.7 %
Total hemoglobin: 10.1 g/dL — ABNORMAL LOW (ref 12.0–16.0)

## 2023-07-10 LAB — MAGNESIUM: Magnesium: 2 mg/dL (ref 1.7–2.4)

## 2023-07-10 LAB — GLUCOSE, CAPILLARY
Glucose-Capillary: 160 mg/dL — ABNORMAL HIGH (ref 70–99)
Glucose-Capillary: 160 mg/dL — ABNORMAL HIGH (ref 70–99)

## 2023-07-10 LAB — BRAIN NATRIURETIC PEPTIDE: B Natriuretic Peptide: 906 pg/mL — ABNORMAL HIGH (ref 0.0–100.0)

## 2023-07-10 MED ORDER — FUROSEMIDE 10 MG/ML IJ SOLN
40.0000 mg | Freq: Two times a day (BID) | INTRAMUSCULAR | Status: DC
  Administered 2023-07-10 – 2023-07-23 (×26): 40 mg via INTRAVENOUS
  Filled 2023-07-10 (×28): qty 4

## 2023-07-10 MED ORDER — FUROSEMIDE 10 MG/ML IJ SOLN: 40.0000 mg | Freq: Two times a day (BID) | INTRAMUSCULAR | Status: DC

## 2023-07-10 MED ORDER — ACETAZOLAMIDE SODIUM 500 MG IJ SOLR
500.0000 mg | Freq: Once | INTRAMUSCULAR | Status: AC
  Administered 2023-07-10: 500 mg via INTRAVENOUS
  Filled 2023-07-10: qty 500

## 2023-07-10 MED ORDER — SODIUM CHLORIDE 0.9% FLUSH: 10.0000 mL | INTRAVENOUS | Status: DC | PRN

## 2023-07-10 MED ORDER — POTASSIUM CHLORIDE 20 MEQ PO PACK
40.0000 meq | PACK | Freq: Once | ORAL | Status: AC
  Administered 2023-07-10: 40 meq
  Filled 2023-07-10: qty 2

## 2023-07-10 MED ORDER — SODIUM CHLORIDE 0.9% FLUSH
10.0000 mL | Freq: Two times a day (BID) | INTRAVENOUS | Status: DC
  Administered 2023-07-11 – 2023-07-12 (×2): 20 mL
  Administered 2023-07-12 – 2023-07-14 (×5): 10 mL
  Administered 2023-07-15: 30 mL
  Administered 2023-07-15 – 2023-07-23 (×15): 10 mL
  Administered 2023-07-23: 20 mL
  Administered 2023-07-24 – 2023-07-27 (×8): 10 mL
  Administered 2023-07-28: 30 mL
  Administered 2023-07-28 – 2023-07-29 (×2): 10 mL

## 2023-07-10 MED ORDER — STERILE WATER FOR INJECTION IJ SOLN
INTRAMUSCULAR | Status: AC
  Filled 2023-07-10: qty 10

## 2023-07-10 MED ORDER — NOREPINEPHRINE 4 MG/250ML-% IV SOLN
0.0000 ug/min | INTRAVENOUS | Status: DC
  Administered 2023-07-10: 2 ug/min via INTRAVENOUS
  Filled 2023-07-10: qty 250

## 2023-07-10 MED ORDER — NOREPINEPHRINE 16 MG/250ML-% IV SOLN
0.0000 ug/min | INTRAVENOUS | Status: DC
  Administered 2023-07-10 – 2023-07-12 (×2): 3 ug/min via INTRAVENOUS
  Filled 2023-07-10 (×2): qty 250

## 2023-07-10 MED ORDER — DEXMEDETOMIDINE HCL IN NACL 400 MCG/100ML IV SOLN
0.0000 ug/kg/h | INTRAVENOUS | Status: DC
  Administered 2023-07-10: 0.4 ug/kg/h via INTRAVENOUS
  Administered 2023-07-11: 0.1 ug/kg/h via INTRAVENOUS
  Filled 2023-07-10 (×2): qty 100

## 2023-07-10 NOTE — Progress Notes (Signed)
 Pharmacy Electrolyte Replacement  Recent Labs:  Recent Labs    07/08/23 0411 07/09/23 0520 07/10/23 1017 07/10/23 1148  K 3.1*   < > 3.6 3.5  MG 1.8   < > 2.0  --   PHOS 3.4  --   --   --   CREATININE 0.61   < > 0.67  --    < > = values in this interval not displayed.    Low Critical Values (K </= 2.5, Phos </= 1, Mg </= 1) Present: None  MD Contacted: n/a  Plan: KCL packet 40 mEq x1 per tube   Patience Bonito, PharmD, BCPS, BCCCP Clinical Pharmacist

## 2023-07-10 NOTE — Progress Notes (Signed)
 NAME:  Darlene [name], MRN:  [id number], DOB:  06-21-1940, LOS: 10 ADMISSION DATE:  06/29/2023, CONSULTATION DATE:  06/30/2023 REFERRING MD: TRH, CHIEF COMPLAINT: afib/flutter RVR    History of Present Illness:  Darlene [name] is a 83 y.o. F with PMH significant for atrial fibrillation on eliquis , OSA on CPAP, HTN, HL, GERD and esophageal stricture who presented to the ED 4/15 with coffee ground emesis.  This initially began about one week before discharge and she was supposed to follow up for a scope outpatient for this, but was told this would need to be done inpatient.  CTA abd/pelvis did not reveal any acute source of bleeding, did show high-grade stenosis involving celiac access with collateral flow from the SMA.  Patient is already scheduled for hiatal hernia and spigelian hernia repair with Dr. Jamse Mcgee in 07/2023.  Pt has not required any transfusions    She was admitted and the plan was to head to EGD and then be discharged, however she has been NPO and her rate controlling agents were held and pt developed Afib with RVR and hypoxia/hypercarbia.  She was placed on bipap and given her recent emesis PCCM was consulted.  Family agreed to intubation and pt started on a cardizem  gtt   Pertinent  Medical History  Anxiety, Chronic lower back pain, Depression, DJD (degenerative joint disease), GERD (gastroesophageal reflux disease), and Hypertension.   Significant Hospital Events: Including procedures, antibiotic start and stop dates in addition to other pertinent events   4/15 admit with coffee ground emesis for inpatient EGD 4/16 afib with RVR, concern for pulm edema, PCCM consult and ICU txfr 4/17: ETT air leak, fixed with more balloon inflation. On Precedex  and Fentanyl  drips for sedation. Controlled Afib 4/21 Complaints of epigastric pain vs delirium overnight, still on amio gtt/heparin . Tube feeds paused this early this morning.   4/22 Delirium improving, patient had BM, Pulmonary Edema vs BL  infiltrates on CXR-, CVP 18 -diuresed with lasix  and metolazone  4/24 Diuresed 3.5 L with metolazone /Lasix   Interim History / Subjective:  Continues to look short of breath.  More fatigued today than she has been.  Cough is poor and minimally productive.  Objective   Blood pressure 107/71, pulse 82, temperature 97.8 F (36.6 C), temperature source Axillary, resp. rate (!) 26, height 5\' 10"  (1.778 m), weight 98 kg, SpO2 96%.    FiO2 (%):  [80 %-95 %] 95 %   Intake/Output Summary (Last 24 hours) at 07/10/2023 1303 Last data filed at 07/10/2023 1156 Gross per 24 hour  Intake 1151.6 ml  Output 1870 ml  Net -718.4 ml   Filed Weights   06/30/23 1316 07/04/23 0544 07/05/23 0522  Weight: 95.7 kg 99.5 kg 98 kg    Examination: General: acute on chronic elderly female, lying in ICU bed on HHFNC in no distress HEENT: Normocephalic, PERRLA intact, missing teeth, pink moist MM, HHFNC in nares CV: s1,s2, irregular, no murmur Pulm: No accessory muscle use, diffuse crackles bilaterally with bronchial breath sounds at both bases. Abdominal: Soft, nontender, small bore feeding tube in place Skin: No rash Neuro: RASS+1, AOx4, intermittent periods of confusion reported GU: purewick, making urine   Point-of-care echo shows moderate to severe RV dysfunction.  LV function appears normal.  There is consolidation at both bases with a trivial effusion unsuitable for thoracentesis.  Ancillary tests personally reviewed  ABG shows compensated hypercarbia BNP has risen to 906 Worsening leukocytosis to 18.6 Chest x-ray shows bilateral infiltrates right worse than left.  Increased interstitial markings with cephalization compared to prior studies.  Assessment & Plan:  Acute hypoxic and hypercapnic respiratory failure- secondary to aspiration from large hiatal hernia ICU delirium Hiatal hernia with oozing cameron erosions leading to UGIB Afib on AC PTA  Plan:  - Persistent respiratory failure which is  multifactorial in origin.   -Slow to resolve aspiration pneumonia due to poor cough and secretion mobilization.  Continue chest physiotherapy - Volume overload.  Despite successful diuresis patient remains in positive and worsening fluid balance.  Increase diuretics. - Start BiPAP to increase in expiratory pressure to mobilize secretions and to correct CO2 which may be contributing to RV dysfunction.  Best Practice (right click and "Reselect all SmartList Selections" daily)   Diet/type: TF DVT prophylaxis heparin  gtt-transition to Eliquis  4/22 Pressure ulcer(s): N/A GI prophylaxis: PPI Lines: Central line Foley:  Yes, and it is still needed Code Status:  full code Last date of multidisciplinary goals of care discussion [updated family-daughter at bedside on 4/25  CRITICAL CARE Performed by: Arlina Lair   Total critical care time: 45 minutes  Critical care time was exclusive of separately billable procedures and treating other patients.  Critical care was necessary to treat or prevent imminent or life-threatening deterioration.  Critical care was time spent personally by me on the following activities: development of treatment plan with patient and/or surrogate as well as nursing, discussions with consultants, evaluation of patient's response to treatment, examination of patient, obtaining history from patient or surrogate, ordering and performing treatments and interventions, ordering and review of laboratory studies, ordering and review of radiographic studies, pulse oximetry, re-evaluation of patient's condition and participation in multidisciplinary rounds.  Arlina Lair, MD [place] ICU Physician [facility]  Pager: [phone] Mobile: [phone] After hours: [phone].

## 2023-07-10 NOTE — Progress Notes (Signed)
 Patient removed from BIPAP and placed on HFNC. 85%/45L. Patient tolerating well. Daughter at bedside. RT will continue to monitor.

## 2023-07-10 NOTE — Progress Notes (Signed)
 Physical Therapy Treatment Patient Details Name: Darlene [name] MRN: [id number] DOB: May 18, 1940 Today's Date: 07/10/2023   History of Present Illness 83 yo female admitted 06/29/23 with acute hypoxic hypercapnic respiratory failure in the setting of aspiration complicated by GI bleed. Intubated 4/16-4/20. 4/17 EGD. PMhx: depression, DJD, OSA, pulmonary HTN, obesity, HLD, Afib    PT Comments  Darlene [name] is confused, lethargic and demonstrating difficulty with awareness and command following. She was ultimately able to rise from surface x 2 trials with +2 assist but lacks carryover and awareness of deficits. Daughter present and stating increased confusion and fatigue. Will continue to follow. Encouraged chair position and HEP. Pt on HHFNC 88% at 45L with SPO2 88%-92%, HR 96, BP 99/74 (82).     If plan is discharge home, recommend the following: A lot of help with bathing/dressing/bathroom;Assistance with cooking/housework;Direct supervision/assist for financial management;Assist for transportation;Two people to help with walking and/or transfers;Direct supervision/assist for medications management   Can travel by private vehicle     No  Equipment Recommendations  Hospital bed;Wheelchair (measurements PT);Hoyer lift    Recommendations for Other Services       Precautions / Restrictions Precautions Precautions: Fall;Other (comment) Recall of Precautions/Restrictions: Impaired Precaution/Restrictions Comments: HHFNC, watch SPO2, cortrak, incontinent     Mobility  Bed Mobility Overal bed mobility: Needs Assistance Bed Mobility: Supine to Sit, Sit to Supine           General bed mobility comments: supine to sit via foot egress function. Pt able to pull trunk forward off surface with min assist. Max +2 to slide to [facility].    Transfers Overall transfer level: Needs assistance   Transfers: Sit to/from Stand Sit to Stand: Max assist, Mod assist, +2 physical assistance            General transfer comment: pt able to stand x 2 trials from foot egress with progression of max +2 standing for 5 sec to mod +2 standing grossly 20 sec. Pt was going to attempt 3rd trial but after pulling trunk off surface stated too fatigued to rise. Did not attempt OOB given poor tolerance for mobility    Ambulation/Gait               General Gait Details: unable   Stairs             Wheelchair Mobility     Tilt Bed    Modified Rankin (Stroke Patients Only)       Balance Overall balance assessment: Needs assistance Sitting-balance support: Bilateral upper extremity supported, Feet supported Sitting balance-Leahy Scale: Poor Sitting balance - Comments: rails, bed support and physical assist to control trunk in sitting   Standing balance support: Bilateral upper extremity supported, Reliant on assistive device for balance, During functional activity Standing balance-Leahy Scale: Poor Standing balance comment: UB support on therapist and tech in standing                            Communication Communication Communication: No apparent difficulties  Cognition Arousal: Lethargic Behavior During Therapy: Flat affect   PT - Cognitive impairments: Orientation, Awareness, Memory, Problem solving, Safety/Judgement, Initiation   Orientation impairments: Time, Situation, Place                   PT - Cognition Comments: pt stating daughter in room is mom, difficulty maintaining attention, increased time to follow single step commands with mod cues Following commands: Impaired Following commands impaired:  Follows one step commands with increased time, Follows one step commands inconsistently    Cueing Cueing Techniques: Verbal cues, Gestural cues, Tactile cues  Exercises General Exercises - Lower Extremity Long Arc Quad: AROM, Both, 10 reps, Seated    General Comments        Pertinent Vitals/Pain Pain Assessment Pain Assessment: No/denies  pain    Home Living                          Prior Function            PT Goals (current goals can now be found in the care plan section) Acute Rehab PT Goals PT Goal Formulation: With patient/family Progress towards PT goals: Not progressing toward goals - comment (limited by fatigue and decreased cognition)    Frequency    Min 2X/week      PT Plan      Co-evaluation              AM-PAC PT "6 Clicks" Mobility   Outcome Measure  Help needed turning from your back to your side while in a flat bed without using bedrails?: A Lot Help needed moving from lying on your back to sitting on the side of a flat bed without using bedrails?: A Lot Help needed moving to and from a bed to a chair (including a wheelchair)?: Total Help needed standing up from a chair using your arms (e.g., wheelchair or bedside chair)?: Total Help needed to walk in hospital room?: Total Help needed climbing 3-5 steps with a railing? : Total 6 Click Score: 8    End of Session Equipment Utilized During Treatment: Oxygen Activity Tolerance: Patient limited by fatigue Patient left: with call bell/phone within reach;with nursing/sitter in room;in bed;with bed alarm set;with family/visitor present Nurse Communication: Mobility status;Need for lift equipment PT Visit Diagnosis: Unsteadiness on feet (R26.81);Other abnormalities of gait and mobility (R26.89);Difficulty in walking, not elsewhere classified (R26.2);Muscle weakness (generalized) (M62.81)     Time: 1610-9604 PT Time Calculation (min) (ACUTE ONLY): 25 min  Charges:    $Therapeutic Activity: 23-37 mins PT General Charges $$ ACUTE PT VISIT: 1 Visit                     Darlene [name], PT Acute Rehabilitation Services Office: [phone]    Darlene [name] 07/10/2023, 12:14 PM

## 2023-07-10 NOTE — Plan of Care (Signed)
  Problem: Education: Goal: Knowledge of General Education information will improve Description: Including pain rating scale, medication(s)/side effects and non-pharmacologic comfort measures Outcome: Progressing   Problem: Health Behavior/Discharge Planning: Goal: Ability to manage health-related needs will improve Outcome: Progressing   Problem: Clinical Measurements: Goal: Ability to maintain clinical measurements within normal limits will improve Outcome: Progressing Goal: Diagnostic test results will improve Outcome: Progressing Goal: Respiratory complications will improve Outcome: Progressing   Problem: Nutrition: Goal: Adequate nutrition will be maintained Outcome: Progressing   

## 2023-07-10 NOTE — Progress Notes (Signed)
 Peripherally Inserted Central Catheter Placement  The IV Nurse has discussed with the patient and/or persons authorized to consent for the patient, the purpose of this procedure and the potential benefits and risks involved with this procedure.  The benefits include less needle sticks, lab draws from the catheter, and the patient may be discharged home with the catheter. Risks include, but not limited to, infection, bleeding, blood clot (thrombus formation), and puncture of an artery; nerve damage and irregular heartbeat and possibility to perform a PICC exchange if needed/ordered by physician.  Alternatives to this procedure were also discussed.  Bard Power PICC patient education guide, fact sheet on infection prevention and patient information card has been provided to patient /or left at bedside. Daughter at bedside gave consent.   PICC Placement Documentation  PICC Triple Lumen 07/10/23 Right Basilic 41 cm 0 cm (Active)  Indication for Insertion or Continuance of Line Vasoactive infusions;Limited venous access - need for IV therapy >5 days (PICC only) 07/10/23 1610  Exposed Catheter (cm) 0 cm 07/10/23 1610  Site Assessment Clean, Dry, Intact 07/10/23 1610  Lumen #1 Status Flushed;Saline locked;Blood return noted 07/10/23 1610  Lumen #2 Status Flushed;Saline locked;Blood return noted 07/10/23 1610  Lumen #3 Status Flushed;Saline locked;Blood return noted 07/10/23 1610  Dressing Type Transparent;Securing device 07/10/23 1610  Dressing Status Antimicrobial disc/dressing in place;Clean, Dry, Intact 07/10/23 1610  Line Care Connections checked and tightened 07/10/23 1610  Line Adjustment (NICU/IV Team Only) No 07/10/23 1610  Dressing Intervention New dressing;Adhesive placed at insertion site (IV team only);Adhesive placed around edges of dressing (IV team/ICU RN only) 07/10/23 1610  Dressing Change Due 07/17/23 07/10/23 1610       Darlene [name] 07/10/2023, 4:11 PM

## 2023-07-11 ENCOUNTER — Other Ambulatory Visit: Payer: Self-pay | Admitting: Cardiology

## 2023-07-11 DIAGNOSIS — J9601 Acute respiratory failure with hypoxia: Secondary | ICD-10-CM | POA: Diagnosis not present

## 2023-07-11 DIAGNOSIS — K449 Diaphragmatic hernia without obstruction or gangrene: Secondary | ICD-10-CM | POA: Diagnosis not present

## 2023-07-11 DIAGNOSIS — J9602 Acute respiratory failure with hypercapnia: Secondary | ICD-10-CM | POA: Diagnosis not present

## 2023-07-11 LAB — BASIC METABOLIC PANEL WITH GFR
Anion gap: 13 (ref 5–15)
BUN: 27 mg/dL — ABNORMAL HIGH (ref 8–23)
CO2: 35 mmol/L — ABNORMAL HIGH (ref 22–32)
Calcium: 8.8 mg/dL — ABNORMAL LOW (ref 8.9–10.3)
Chloride: 89 mmol/L — ABNORMAL LOW (ref 98–111)
Creatinine, Ser: 0.86 mg/dL (ref 0.44–1.00)
GFR, Estimated: >60 mL/min (ref >60)
Glucose, Bld: 205 mg/dL — ABNORMAL HIGH (ref 70–99)
Potassium: 3 mmol/L — ABNORMAL LOW (ref 3.5–5.1)
Sodium: 137 mmol/L (ref 135–145)

## 2023-07-11 LAB — CBC
HCT: 31.7 % — ABNORMAL LOW (ref 36.0–46.0)
Hemoglobin: 10.2 g/dL — ABNORMAL LOW (ref 12.0–15.0)
MCH: 31.7 pg (ref 26.0–34.0)
MCHC: 32.2 g/dL (ref 30.0–36.0)
MCV: 98.4 fL (ref 80.0–100.0)
Platelets: 314 10*3/uL (ref 150–400)
RBC: 3.22 MIL/uL — ABNORMAL LOW (ref 3.87–5.11)
RDW: 13.4 % (ref 11.5–15.5)
WBC: 20.1 10*3/uL — ABNORMAL HIGH (ref 4.0–10.5)
nRBC: 0 % (ref 0.0–0.2)

## 2023-07-11 LAB — GLUCOSE, CAPILLARY
Glucose-Capillary: 132 mg/dL — ABNORMAL HIGH (ref 70–99)
Glucose-Capillary: 156 mg/dL — ABNORMAL HIGH (ref 70–99)
Glucose-Capillary: 165 mg/dL — ABNORMAL HIGH (ref 70–99)
Glucose-Capillary: 166 mg/dL — ABNORMAL HIGH (ref 70–99)
Glucose-Capillary: 178 mg/dL — ABNORMAL HIGH (ref 70–99)
Glucose-Capillary: 187 mg/dL — ABNORMAL HIGH (ref 70–99)
Glucose-Capillary: 211 mg/dL — ABNORMAL HIGH (ref 70–99)

## 2023-07-11 MED ORDER — POTASSIUM CHLORIDE 20 MEQ PO PACK
20.0000 meq | PACK | ORAL | Status: AC
  Administered 2023-07-11 (×2): 20 meq
  Filled 2023-07-11 (×2): qty 1

## 2023-07-11 MED ORDER — CARMEX CLASSIC LIP BALM EX OINT: TOPICAL_OINTMENT | CUTANEOUS | Status: DC | PRN

## 2023-07-11 MED ORDER — POTASSIUM CHLORIDE 10 MEQ/50ML IV SOLN
10.0000 meq | INTRAVENOUS | Status: AC
  Administered 2023-07-11 (×4): 10 meq via INTRAVENOUS
  Filled 2023-07-11 (×4): qty 50

## 2023-07-11 MED ORDER — MIDODRINE HCL 5 MG PO TABS
5.0000 mg | ORAL_TABLET | Freq: Three times a day (TID) | ORAL | Status: DC
  Administered 2023-07-11 – 2023-07-12 (×4): 5 mg
  Filled 2023-07-11 (×4): qty 1

## 2023-07-11 MED ORDER — INSULIN ASPART 100 UNIT/ML IJ SOLN
0.0000 [IU] | INTRAMUSCULAR | Status: DC
  Administered 2023-07-11 (×4): 3 [IU] via SUBCUTANEOUS
  Administered 2023-07-11: 5 [IU] via SUBCUTANEOUS
  Administered 2023-07-11: 3 [IU] via SUBCUTANEOUS
  Administered 2023-07-11: 2 [IU] via SUBCUTANEOUS
  Administered 2023-07-12 (×2): 3 [IU] via SUBCUTANEOUS

## 2023-07-11 NOTE — Progress Notes (Signed)
 NAME:  Darlene [name], MRN:  [id number], DOB:  [birth year], LOS: 11 ADMISSION DATE:  06/29/2023, CONSULTATION DATE:  06/30/2023 REFERRING MD: TRH, CHIEF COMPLAINT: afib/flutter RVR    History of Present Illness:  Darlene [name] is a 83 y.o. F with PMH significant for atrial fibrillation on eliquis , OSA on CPAP, HTN, HL, GERD and esophageal stricture who presented to the ED 4/15 with coffee ground emesis.  This initially began about one week before discharge and she was supposed to follow up for a scope outpatient for this, but was told this would need to be done inpatient.  CTA abd/pelvis did not reveal any acute source of bleeding, did show high-grade stenosis involving celiac access with collateral flow from the SMA.  Patient is already scheduled for hiatal hernia and spigelian hernia repair with Dr. Jamse Mcgee in 07/2023.  Pt has not required any transfusions    She was admitted and the plan was to head to EGD and then be discharged, however she has been NPO and her rate controlling agents were held and pt developed Afib with RVR and hypoxia/hypercarbia.  She was placed on bipap and given her recent emesis PCCM was consulted.  Family agreed to intubation and pt started on a cardizem  gtt   Pertinent  Medical History  Anxiety, Chronic lower back pain, Depression, DJD (degenerative joint disease), GERD (gastroesophageal reflux disease), and Hypertension.   Significant Hospital Events: Including procedures, antibiotic start and stop dates in addition to other pertinent events   4/15 admit with coffee ground emesis for inpatient EGD 4/16 afib with RVR, concern for pulm edema, PCCM consult and ICU txfr 4/17: ETT air leak, fixed with more balloon inflation. On Precedex  and Fentanyl  drips for sedation. Controlled Afib 4/21 Complaints of epigastric pain vs delirium overnight, still on amio gtt/heparin . Tube feeds paused this early this morning.   4/22 Delirium improving, patient had BM, Pulmonary Edema vs BL  infiltrates on CXR-, CVP 18 -diuresed with lasix  and metolazone  4/24 Diuresed 3.5 L with metolazone /Lasix  4/26 Placed on BiPAP for increased WOB  Interim History / Subjective:  Switched to BiPAP yesterday.  Good response to diuretics yesterday without increase in creatinine.  She seems brighter and stated that she feels better.  Was started on NE to offset hypotension from Precedex  needed to tolerate BiPAP.  Objective   Blood pressure (!) 112/42, pulse 93, temperature 98.2 F (36.8 C), resp. rate (!) 22, height 5\' 10"  (1.778 m), weight 96.5 kg, SpO2 96%. CVP:  [7 mmHg-24 mmHg] 11 mmHg  Vent Mode: BIPAP;PCV FiO2 (%):  [50 %-100 %] 50 % Set Rate:  [14 bmp] 14 bmp PEEP:  [8 cmH20] 8 cmH20 Pressure Support:  [8 cmH20] 8 cmH20   Intake/Output Summary (Last 24 hours) at 07/11/2023 0918 Last data filed at 07/11/2023 0900 Gross per 24 hour  Intake 1506.8 ml  Output 3540 ml  Net -2033.2 ml   Filed Weights   07/04/23 0544 07/05/23 0522 07/11/23 0500  Weight: 99.5 kg 98 kg 96.5 kg    Examination: General: acute on chronic elderly female, lying in ICU bed on BiPAP with reasonable seal.  HEENT: No pressure injury CV: HS normal, hard to assess JVP. Minimal leg edema. Hands are warm.  CVP in single digits.  Pulm: No accessory muscle use, diffuse crackles bilaterally with bronchial breath sounds at both bases. Abdominal: Soft, nontender, small bore feeding tube in place Skin: No rash Neuro: Awake and appropriate. No focal deficits. Much more awake today.  GU:  Foley in place, making urine   Point-of-care echo showed moderate to severe RV dysfunction.  LV function appears normal.  There is consolidation at both bases with a trivial effusion unsuitable for thoracentesis.  Ancillary tests personally reviewed  ABG shows compensated hypercarbia Hypokalemia 3.0 Worsening leukocytosis to 20.1  Creatinine 0.86 Chest x-ray shows bilateral infiltrates right worse than left.  Increased  interstitial markings with cephalization compared to prior studies. HB 10.2 Assessment & Plan:  Acute hypoxic and hypercapnic respiratory failure- secondary to aspiration from large hiatal hernia ICU delirium Hiatal hernia with oozing Cameron erosions leading to UGIB Afib on AC PTA Severe deconditioning.   Plan:  - Persistent respiratory failure which is multifactorial in origin.   - Slow to resolve aspiration pneumonia due to poor cough and secretion mobilization.  Continue chest physiotherapy. Will get patient up to chair today. - Volume overload. Continue to diurese as long as BP and creatinine tolerate.  - Have added midodrine. Wean NE to keep MAP > 65.  Hopefully stopping Precedex  will help BP. - See how she does today on HHFNC and resume BiPAP only if looks worse again.  - Replace potassium.  - Continue current dose diltiazem  and metoprolol  for rate control, and apixaban  for CVA prevention.   Best Practice (right click and "Reselect all SmartList Selections" daily)   Diet/type: TF DVT prophylaxis heparin  gtt-transition to Eliquis  4/22 Pressure ulcer(s): N/A GI prophylaxis: PPI Lines: Central line Foley:  Yes, and it is still needed Code Status:  full code Last date of multidisciplinary goals of care discussion [updated family-daughter at bedside on 4/25  CRITICAL CARE Performed by: Arlina Lair   Total critical care time: 40 minutes  Critical care time was exclusive of separately billable procedures and treating other patients.  Critical care was necessary to treat or prevent imminent or life-threatening deterioration.  Critical care was time spent personally by me on the following activities: development of treatment plan with patient and/or surrogate as well as nursing, discussions with consultants, evaluation of patient's response to treatment, examination of patient, obtaining history from patient or surrogate, ordering and performing treatments and interventions,  ordering and review of laboratory studies, ordering and review of radiographic studies, pulse oximetry, re-evaluation of patient's condition and participation in multidisciplinary rounds.  Arlina Lair, MD [facility] ICU Physician [facility] [place] [facility]  Pager: [phone] Mobile: [phone] After hours: [phone].

## 2023-07-11 NOTE — Progress Notes (Signed)
 eLink Physician-Brief Progress Note Patient Name: Darlene [name] DOB: 1940-05-19 MRN: [id number]   Date of Service  07/11/2023  HPI/Events of Note  Persistent hyperglycemia currently on tube feeds  eICU Interventions  Add sliding-scale insulin      Intervention Category Intermediate Interventions: Hyperglycemia - evaluation and treatment  Sharlett Lienemann 07/11/2023, 12:06 AM

## 2023-07-11 NOTE — Progress Notes (Signed)
 Pharmacy Electrolyte Replacement  Recent Labs:  Recent Labs    07/10/23 1017 07/10/23 1148 07/11/23 0345  K 3.6   < > 3.0*  MG 2.0  --   --   CREATININE 0.67  --  0.86   < > = values in this interval not displayed.    Low Critical Values (K </= 2.5, Phos </= 1, Mg </= 1) Present: None  MD Contacted: n/a  Plan: KCL 40 mEq per tube, KCL 10 mEq x 4 (central line concentrated)   Patience Bonito, PharmD, BCPS, BCCCP Clinical Pharmacist

## 2023-07-12 ENCOUNTER — Inpatient Hospital Stay (HOSPITAL_COMMUNITY)

## 2023-07-12 DIAGNOSIS — R739 Hyperglycemia, unspecified: Secondary | ICD-10-CM

## 2023-07-12 DIAGNOSIS — J9602 Acute respiratory failure with hypercapnia: Secondary | ICD-10-CM | POA: Diagnosis not present

## 2023-07-12 DIAGNOSIS — K449 Diaphragmatic hernia without obstruction or gangrene: Secondary | ICD-10-CM | POA: Diagnosis not present

## 2023-07-12 DIAGNOSIS — J9601 Acute respiratory failure with hypoxia: Secondary | ICD-10-CM | POA: Diagnosis not present

## 2023-07-12 DIAGNOSIS — K922 Gastrointestinal hemorrhage, unspecified: Secondary | ICD-10-CM | POA: Diagnosis not present

## 2023-07-12 LAB — CBC
HCT: 32.4 % — ABNORMAL LOW (ref 36.0–46.0)
Hemoglobin: 10.4 g/dL — ABNORMAL LOW (ref 12.0–15.0)
MCH: 31.8 pg (ref 26.0–34.0)
MCHC: 32.1 g/dL (ref 30.0–36.0)
MCV: 99.1 fL (ref 80.0–100.0)
Platelets: 358 10*3/uL (ref 150–400)
RBC: 3.27 MIL/uL — ABNORMAL LOW (ref 3.87–5.11)
RDW: 13.5 % (ref 11.5–15.5)
WBC: 15.2 10*3/uL — ABNORMAL HIGH (ref 4.0–10.5)
nRBC: 0 % (ref 0.0–0.2)

## 2023-07-12 LAB — BASIC METABOLIC PANEL WITH GFR
Anion gap: 11 (ref 5–15)
BUN: 28 mg/dL — ABNORMAL HIGH (ref 8–23)
CO2: 33 mmol/L — ABNORMAL HIGH (ref 22–32)
Calcium: 9.2 mg/dL (ref 8.9–10.3)
Chloride: 93 mmol/L — ABNORMAL LOW (ref 98–111)
Creatinine, Ser: 0.84 mg/dL (ref 0.44–1.00)
GFR, Estimated: >60 mL/min (ref >60)
Glucose, Bld: 188 mg/dL — ABNORMAL HIGH (ref 70–99)
Potassium: 3.5 mmol/L (ref 3.5–5.1)
Sodium: 137 mmol/L (ref 135–145)

## 2023-07-12 LAB — GLUCOSE, CAPILLARY
Glucose-Capillary: 183 mg/dL — ABNORMAL HIGH (ref 70–99)
Glucose-Capillary: 156 mg/dL — ABNORMAL HIGH (ref 70–99)
Glucose-Capillary: 176 mg/dL — ABNORMAL HIGH (ref 70–99)
Glucose-Capillary: 146 mg/dL — ABNORMAL HIGH (ref 70–99)
Glucose-Capillary: 136 mg/dL — ABNORMAL HIGH (ref 70–99)
Glucose-Capillary: 131 mg/dL — ABNORMAL HIGH (ref 70–99)

## 2023-07-12 LAB — BODY FLUID CELL COUNT WITH DIFFERENTIAL
Eos, Fluid: 0 %
Lymphs, Fluid: 82 %
Monocyte-Macrophage-Serous Fluid: 4 % — ABNORMAL LOW (ref 50–90)
Neutrophil Count, Fluid: 13 % (ref 0–25)
Other Cells, Fluid: 1 %
Total Nucleated Cell Count, Fluid: 889 uL (ref 0–1000)

## 2023-07-12 LAB — LACTATE DEHYDROGENASE, PLEURAL OR PERITONEAL FLUID: LD, Fluid: 176 U/L — ABNORMAL HIGH (ref 3–23)

## 2023-07-12 LAB — PROCALCITONIN: Procalcitonin: 0.14 ng/mL

## 2023-07-12 LAB — PROTEIN, PLEURAL OR PERITONEAL FLUID: Total protein, fluid: <3 g/dL

## 2023-07-12 LAB — CHOLESTEROL, TOTAL: Cholesterol: 104 mg/dL (ref 0–200)

## 2023-07-12 LAB — BRAIN NATRIURETIC PEPTIDE: B Natriuretic Peptide: 185.1 pg/mL — ABNORMAL HIGH (ref 0.0–100.0)

## 2023-07-12 LAB — GLUCOSE, PLEURAL OR PERITONEAL FLUID: Glucose, Fluid: 151 mg/dL

## 2023-07-12 LAB — LACTATE DEHYDROGENASE: LDH: 184 U/L (ref 98–192)

## 2023-07-12 LAB — PROTEIN, TOTAL: Total Protein: 5.9 g/dL — ABNORMAL LOW (ref 6.5–8.1)

## 2023-07-12 MED ORDER — INSULIN ASPART 100 UNIT/ML IJ SOLN
0.0000 [IU] | INTRAMUSCULAR | Status: DC
  Administered 2023-07-12: 3 [IU] via SUBCUTANEOUS
  Administered 2023-07-12: 4 [IU] via SUBCUTANEOUS
  Administered 2023-07-12 (×2): 3 [IU] via SUBCUTANEOUS
  Administered 2023-07-13: 4 [IU] via SUBCUTANEOUS
  Administered 2023-07-13 (×2): 3 [IU] via SUBCUTANEOUS
  Administered 2023-07-13: 4 [IU] via SUBCUTANEOUS
  Administered 2023-07-13 – 2023-07-15 (×8): 3 [IU] via SUBCUTANEOUS
  Administered 2023-07-15 (×2): 4 [IU] via SUBCUTANEOUS
  Administered 2023-07-15: 3 [IU] via SUBCUTANEOUS
  Administered 2023-07-16: 4 [IU] via SUBCUTANEOUS
  Administered 2023-07-16 – 2023-07-17 (×8): 3 [IU] via SUBCUTANEOUS
  Administered 2023-07-17: 4 [IU] via SUBCUTANEOUS
  Administered 2023-07-18 (×3): 3 [IU] via SUBCUTANEOUS
  Administered 2023-07-18 (×2): 4 [IU] via SUBCUTANEOUS
  Administered 2023-07-19: 3 [IU] via SUBCUTANEOUS
  Administered 2023-07-19: 4 [IU] via SUBCUTANEOUS
  Administered 2023-07-19 (×2): 3 [IU] via SUBCUTANEOUS
  Administered 2023-07-19 – 2023-07-20 (×2): 4 [IU] via SUBCUTANEOUS
  Administered 2023-07-20 (×2): 3 [IU] via SUBCUTANEOUS
  Administered 2023-07-20: 4 [IU] via SUBCUTANEOUS
  Administered 2023-07-20 – 2023-07-21 (×4): 3 [IU] via SUBCUTANEOUS
  Administered 2023-07-22 (×2): 4 [IU] via SUBCUTANEOUS
  Administered 2023-07-23 (×2): 3 [IU] via SUBCUTANEOUS
  Administered 2023-07-23: 4 [IU] via SUBCUTANEOUS
  Administered 2023-07-24: 3 [IU] via SUBCUTANEOUS
  Administered 2023-07-24: 4 [IU] via SUBCUTANEOUS
  Administered 2023-07-24 – 2023-07-26 (×4): 3 [IU] via SUBCUTANEOUS
  Administered 2023-07-26: 4 [IU] via SUBCUTANEOUS
  Administered 2023-07-26 – 2023-07-27 (×7): 3 [IU] via SUBCUTANEOUS
  Administered 2023-07-28: 4 [IU] via SUBCUTANEOUS
  Administered 2023-07-28 (×2): 3 [IU] via SUBCUTANEOUS
  Administered 2023-07-28: 4 [IU] via SUBCUTANEOUS
  Administered 2023-07-29: 3 [IU] via SUBCUTANEOUS

## 2023-07-12 MED ORDER — POTASSIUM CHLORIDE 20 MEQ PO PACK
40.0000 meq | PACK | Freq: Once | ORAL | Status: AC
  Administered 2023-07-12: 40 meq
  Filled 2023-07-12: qty 2

## 2023-07-12 MED ORDER — AMIODARONE HCL IN DEXTROSE 360-4.14 MG/200ML-% IV SOLN
30.0000 mg/h | INTRAVENOUS | Status: DC
  Administered 2023-07-12 (×2): 30 mg/h via INTRAVENOUS
  Filled 2023-07-12 (×2): qty 200

## 2023-07-12 MED ORDER — MIDODRINE HCL 5 MG PO TABS
10.0000 mg | ORAL_TABLET | Freq: Three times a day (TID) | ORAL | Status: DC
  Administered 2023-07-12 – 2023-07-26 (×42): 10 mg
  Filled 2023-07-12 (×43): qty 2

## 2023-07-12 NOTE — Progress Notes (Signed)
 Postthoracentesis chest x-ray reviewed aeration improved Plan Follow-up pleural studies currently pending

## 2023-07-12 NOTE — Progress Notes (Signed)
 [facility] ADULT ICU REPLACEMENT PROTOCOL   The patient does apply for the [facility] Adult ICU Electrolyte Replacment Protocol based on the criteria listed below:   1.Exclusion criteria: TCTS, ECMO, Dialysis, and Myasthenia Gravis patients 2. Is GFR >/= 30 ml/min? Yes.    Patient's GFR today is >60 3. Is SCr </= 2? Yes.   Patient's SCr is 0.84 mg/dL 4. Did SCr increase >/= 0.5 in 24 hours? No. 5.Pt's weight >40kg  Yes.   6. Abnormal electrolyte(s): K  7. Electrolytes replaced per protocol 8.  Call MD STAT for K+ </= 2.5, Phos </= 1, or Mag </= 1 Physician:  Angelo Barge [facility] 07/12/2023 5:58 AM

## 2023-07-12 NOTE — Procedures (Signed)
 Thoracentesis  Procedure Note  Darlene [name]  [id number]  02/07/1941  Date:07/12/23  Time:4:53 PM   Provider Performing:Pete E Burna Carrier   Procedure: Thoracentesis with imaging guidance ([id number])  Indication(s) Pleural Effusion  Consent Risks of the procedure as well as the alternatives and risks of each were explained to the patient and/or caregiver.  Consent for the procedure was obtained and is signed in the bedside chart  Anesthesia Topical only with 1% lidocaine     Time Out Verified patient identification, verified procedure, site/side was marked, verified correct patient position, special equipment/implants available, medications/allergies/relevant history reviewed, required imaging and test results available.   Sterile Technique Maximal sterile technique including full sterile barrier drape, hand hygiene, sterile gown, sterile gloves, mask, hair covering, sterile ultrasound probe cover (if used).  Procedure Description Ultrasound was used to identify appropriate pleural anatomy for placement and overlying skin marked.  Area of drainage cleaned and draped in sterile fashion. Lidocaine  was used to anesthetize the skin and subcutaneous tissue.  100 cc's of concentrated  appearing fluid was drained from the right pleural space. Catheter then removed and bandaid applied to site.   Complications/Tolerance None; patient tolerated the procedure well. Chest X-ray is ordered to confirm no post-procedural complication.   EBL Minimal   Specimen(s) Pleural fluid

## 2023-07-12 NOTE — Plan of Care (Signed)

## 2023-07-12 NOTE — Progress Notes (Signed)
 NAME:  Darlene [name], MRN:  [id number], DOB:  Oct 14, 1940, LOS: 12 ADMISSION DATE:  06/29/2023, CONSULTATION DATE:  06/30/2023 REFERRING MD: TRH, CHIEF COMPLAINT: afib/flutter RVR    History of Present Illness:  Darlene [name] is a 84 y.o. F with PMH significant for atrial fibrillation on eliquis , OSA on CPAP, HTN, HL, GERD and esophageal stricture who presented to the ED 4/15 with coffee ground emesis.  This initially began about one week before discharge and she was supposed to follow up for a scope outpatient for this, but was told this would need to be done inpatient.  CTA abd/pelvis did not reveal any acute source of bleeding, did show high-grade stenosis involving celiac access with collateral flow from the SMA.  Patient is already scheduled for hiatal hernia and spigelian hernia repair with Dr. Jamse Mcgee in 07/2023.  Pt has not required any transfusions    She was admitted and the plan was to head to EGD and then be discharged, however she has been NPO and her rate controlling agents were held and pt developed Afib with RVR and hypoxia/hypercarbia.  She was placed on bipap and given her recent emesis PCCM was consulted.  Family agreed to intubation and pt started on a cardizem  gtt   Pertinent  Medical History  Anxiety, Chronic lower back pain, Depression, DJD (degenerative joint disease), GERD (gastroesophageal reflux disease), and Hypertension.   Significant Hospital Events: Including procedures, antibiotic start and stop dates in addition to other pertinent events   4/15 admit with coffee ground emesis for inpatient EGD 4/16 afib with RVR, concern for pulm edema, PCCM consult and ICU txfr 4/17: ETT air leak, fixed with more balloon inflation. On Precedex  and Fentanyl  drips for sedation. Controlled Afib 4/21 Complaints of epigastric pain vs delirium overnight, still on amio gtt/heparin . Tube feeds paused this early this morning.   4/22 Delirium improving, patient had BM, Pulmonary Edema vs BL  infiltrates on CXR-, CVP 18 -diuresed with lasix  and metolazone  4/24 Diuresed 3.5 L with metolazone /Lasix  4/26 Placed on BiPAP for increased WOB 4/27 Point-of-care echo showed moderate to severe RV dysfunction.  LV function appears normal.  There is consolidation at both bases with a trivial effusion unsuitable for thoracentesis.  Started on norepinephrine  to support blood pressure as patient required Precedex  for delirium, which exacerbated hypotension  Interim History / Subjective:    Objective   Blood pressure (!) 121/52, pulse (!) 110, temperature 98.4 F (36.9 C), temperature source Oral, resp. rate (!) 31, height 5\' 10"  (1.778 m), weight 96.5 kg, SpO2 94%. CVP:  [1 mmHg-10 mmHg] 10 mmHg  Vent Mode: Stand-by FiO2 (%):  [38 %-60 %] 40 %   Intake/Output Summary (Last 24 hours) at 07/12/2023 4098 Last data filed at 07/12/2023 0800 Gross per 24 hour  Intake 1618.18 ml  Output 2415 ml  Net -796.82 ml   Filed Weights   07/04/23 0544 07/05/23 0522 07/11/23 0500  Weight: 99.5 kg 98 kg 96.5 kg    Examination: General This is a debilitated 83 year old female patient currently resting in bed on heated high flow HEENT normocephalic atraumatic no jugular venous distention mucous membranes are moist Pulmonary diffuse rales throughout with some scattered rhonchi Portable chest x-ray personally reviewed did  shows feeding tube in place, she has bilateral right greater than left patchy airspace disease, film is slightly rotated but there is right middle lobe consolidation and probable element of right effusion Cardiac regular irregular with atrial fibrillation on telemetry Abdomen soft not tender no  organomegaly Extremities are warm and dry, has trace lower extremity edema Neuro awake oriented moves all extremities profoundly weak Gu cl yellow   Resolved problem list   Assessment & Plan:  Acute hypoxic and hypercapnic respiratory failure- secondary to aspiration pneumonia in setting of  large hiatal hernia, complicated by poor cough mechanics as well as volume overload Plan Continuing to cycle NIPPV Wean supplemental oxygen Continue to push diuresis as BUN and blood pressure and creatinine allow, we will repeat chemistry in the morning We are going to hold her Eliquis  this morning and proceed with a diagnostic and therapeutic right thoracentesis later today Today is day #4 Zosyn , still has fairly significant leukocytosis likely complete 7-day total Will need significant reflux precautions  ICU delirium Plan Continue supportive care Stop dex  Hiatal hernia with oozing Cameron erosions leading to UGIB Hemoglobin has been stable she is back on her DOAC  plan Continuing tube feeds via core track Will need swallow evaluation at some point With her hiatal hernia will need to continue fairly strict reflux precautions once a diet started There is sideration for surgical correction of her hiatal hernia in the future by Dr. Jamse Mcgee on 5/20, I think she is too debilitated at this point we will touch base with surgery might need to consider surgical J-tube Trend CBC Continue PPI twice daily  Drug related hypotension Plan Continue midodrine, increasing to 10 mg 3 times a day Continue to titrate norepinephrine  for mean arterial pressure greater than 65  Afib on AC PTA Plan Continue telemetry monitoring  rate control Discontinue her diltiazem  and metoprolol  for now given her need for norepinephrine  Start amiodarone  infusion DOAC to resume post Thora  Severe deconditioning.  Plan Continue PT/OT  Hyperglycemia Plan Sliding scale insulin , increasing to moderate Goal glucose 140-180  Best Practice (right click and "Reselect all SmartList Selections" daily)   Diet/type: TF DVT prophylaxis heparin  gtt-transition to Eliquis  4/22 Pressure ulcer(s): N/A GI prophylaxis: PPI Lines: Central line Foley:  Yes, and it is still needed Code Status:  full code Last date of  multidisciplinary goals of care discussion [updated family-daughter at bedside on 4/25  CRITICAL CARE 34 minutes Performed by: Armstead Bertrand

## 2023-07-12 NOTE — Progress Notes (Addendum)
 Occupational Therapy Treatment Patient Details Name: Darlene [name] MRN: [id number] DOB: 1940-09-12 Today's Date: 07/12/2023   History of present illness 83 yo female admitted 06/29/23 with acute hypoxic hypercapnic respiratory failure in the setting of aspiration complicated by GI bleed. Intubated 4/16-4/20. 4/17 EGD. PMhx: depression, DJD, OSA, pulmonary HTN, obesity, HLD, Afib   OT comments  Pt making slow progress toward goals, limited by fatigue this date. Pt able to sit EOB x10 min with mod +2 for bed mobility, intermittent min A support at EOB due to fatigue, pt able to complete seated grooming task with set up A and able to perform BLE/BUE therex seated EOB. SpO2 87% at lowest upon return to supine, and pt fatiguing quickly. Pt presenting with impairments listed below, will follow acutely. Patient will benefit from continued inpatient follow up therapy, <3 hours/day to maximize safety/ind with ADL/functional mobility.       If plan is discharge home, recommend the following:  Two people to help with walking and/or transfers;A lot of help with bathing/dressing/bathroom;Assistance with cooking/housework;Assist for transportation;Help with stairs or ramp for entrance;Direct supervision/assist for medications management;Direct supervision/assist for financial management   Equipment Recommendations  BSC/3in1    Recommendations for Other Services PT consult    Precautions / Restrictions Precautions Precautions: Fall;Other (comment) Recall of Precautions/Restrictions: Impaired Precaution/Restrictions Comments: HHFNC, watch SPO2, cortrak, incontinent Restrictions Weight Bearing Restrictions Per Provider Order: No       Mobility Bed Mobility Overal bed mobility: Needs Assistance Bed Mobility: Supine to Sit, Sit to Supine     Supine to sit: Mod assist, +2 for physical assistance Sit to supine: Mod assist, +2 for physical assistance        Transfers                    General transfer comment: deferred 2/2 pt fatigue     Balance Overall balance assessment: Needs assistance Sitting-balance support: Bilateral upper extremity supported, Feet supported Sitting balance-Leahy Scale: Fair Sitting balance - Comments: intermitting min A, incr as she fatigues                                   ADL either performed or assessed with clinical judgement   ADL Overall ADL's : Needs assistance/impaired     Grooming: Sitting;Minimal assistance;Brushing hair;Wash/dry face Grooming Details (indicate cue type and reason): seated EOB             Lower Body Dressing: Total assistance Lower Body Dressing Details (indicate cue type and reason): donning socks                    Extremity/Trunk Assessment Upper Extremity Assessment Upper Extremity Assessment: Generalized weakness   Lower Extremity Assessment Lower Extremity Assessment: Defer to PT evaluation        Vision   Additional Comments: not formally assessed   Perception Perception Perception: Not tested   Praxis Praxis Praxis: Not tested   Communication Communication Communication: No apparent difficulties   Cognition Arousal: Lethargic, Alert (keeping eyes closed but follows commands/responds to questions asked) Behavior During Therapy: Flat affect                                 Following commands: Impaired Following commands impaired: Follows one step commands with increased time      Cueing   Cueing Techniques:  Verbal cues, Gestural cues, Tactile cues  Exercises      Shoulder Instructions       General Comments SpO2 87% at lowest    Pertinent Vitals/ Pain       Pain Assessment Pain Assessment: Faces Pain Score: 4  Faces Pain Scale: Hurts little more Pain Location: arms and legs Pain Descriptors / Indicators: Discomfort Pain Intervention(s): Limited activity within patient's tolerance, Monitored during session, Repositioned  Home  Living                                          Prior Functioning/Environment              Frequency  Min 2X/week        Progress Toward Goals  OT Goals(current goals can now be found in the care plan section)  Progress towards OT goals: Progressing toward goals  Acute Rehab OT Goals Patient Stated Goal: did not state OT Goal Formulation: With patient Time For Goal Achievement: 07/21/23 Potential to Achieve Goals: Good ADL Goals Pt Will Perform Grooming: standing;with contact guard assist Pt Will Perform Upper Body Dressing: with contact guard assist;sitting Pt Will Perform Lower Body Dressing: with min assist;sitting/lateral leans;sit to/from stand Pt Will Transfer to Toilet: with min assist;ambulating;regular height toilet Additional ADL Goal #1: Pt will tolerate standing activity x5 min in order to improve activity tolerance for ADLs  Plan      Co-evaluation    PT/OT/SLP Co-Evaluation/Treatment: Yes Reason for Co-Treatment: Complexity of the patient's impairments (multi-system involvement);To address functional/ADL transfers;For patient/therapist safety   OT goals addressed during session: ADL's and self-care;Strengthening/ROM      AM-PAC OT "6 Clicks" Daily Activity     Outcome Measure   Help from another person eating meals?: A Little Help from another person taking care of personal grooming?: A Little Help from another person toileting, which includes using toliet, bedpan, or urinal?: A Lot Help from another person bathing (including washing, rinsing, drying)?: A Lot Help from another person to put on and taking off regular upper body clothing?: A Lot Help from another person to put on and taking off regular lower body clothing?: A Lot 6 Click Score: 14    End of Session    OT Visit Diagnosis: Unsteadiness on feet (R26.81);Other abnormalities of gait and mobility (R26.89);Muscle weakness (generalized) (M62.81)   Activity Tolerance  Patient limited by fatigue   Patient Left in bed;with call bell/phone within reach;with bed alarm set   Nurse Communication Mobility status        Time: 7829-5621 OT Time Calculation (min): 25 min  Charges: OT General Charges $OT Visit: 1 Visit OT Treatments $Self Care/Home Management : 8-22 mins  Jakel Alphin K, OTD, OTR/L SecureChat Preferred Acute Rehab [phone]   Antionette Kirks 07/12/2023, 12:34 PM

## 2023-07-12 NOTE — Progress Notes (Signed)
 Progress Note  11 Days Post-Op  Subjective: Consult for hiatal hernia repair.   Patient was seen by Dr. Lanell Pinta in the office 06/18/23 to discuss elective hiatal hernia repair and spigelian hernia repair. She was scheduled for next month. She has a large paraesophageal hernia with the stomach resting largely in the right chest along with a moderate right lower quadrant spigelian versus inguinal hernia containing nonobstructed ascending colon. Additional incidental findings included a 4.1 cm fat density lesion along the right kidney favoring a renal angiomyolipoma versus perirenal lipoma; mild atelectasis, scattered hepatic cysts up to 2.4 cm in diameter, cholelithiasis without inflammatory change or biliary ductal dilatation, aortic atherosclerosis, mild degenerative changes of the lumbar spine. She was scheduled for EGD 4/16 as an outpatient with Dr. Yvone Herd but presented to the ED 4/15 with coffee-ground emesis. CTA did not reveal any acute source of bleeding but did show high-grade stenosis involving celiac access with collateral flow from SMA. Patient has not required any blood transfusions for acute UGI bleeding. EGD was planned inpatient but patient developed A. Fib with RVR with concern for pulmonary edema and patient was transferred to the ICU and intubated. A. Fib is now controlled and she was extubated but is now on HFNC. She has had some ICU related delirium and some hypotension thought to be secondary to medications. She also has physical deconditioning. General surgery asked to see for consideration of hiatal hernia repair with possible G-tube placement this admission.   PMH otherwise significant for anxiety and depression, low back pain/DJD, GERD, hyperlipidemia, atrial fibrillation on Eliquis , obstructive sleep apnea on CPAP, pulmonary hypertension. Her last dose of eliquis  was yesterday evening. Her daughter is at bedside in the ICU. Patient reportedly has not been complaining of abdominal  pain and is tolerating TF and having bowel function.   Objective: Vital signs in last 24 hours: Temp:  [97.7 F (36.5 C)-98.4 F (36.9 C)] 97.7 F (36.5 C) (04/28 1114) Pulse Rate:  [83-178] 110 (04/28 0800) Resp:  [15-35] 31 (04/28 0800) BP: (91-126)/(36-81) 121/52 (04/28 0800) SpO2:  [73 %-97 %] 94 % (04/28 1110) FiO2 (%):  [40 %-55 %] 45 % (04/28 1110) Last BM Date : 07/12/23  Intake/Output from previous day: 04/27 0701 - 04/28 0700 In: 1753 [I.V.:165.1; NG/GT:1185; IV Piggyback:402.9] Out: 2730 [Urine:2730] Intake/Output this shift: Total I/O In: 262.7 [I.V.:53.4; NG/GT:180; IV Piggyback:29.3] Out: 715 [Urine:715]  PE: General: WD, ill appearing elderly female who is laying in bed with HFNC on  HEENT: head is normocephalic, atraumatic.  Sclera are anicteric, cortrak present  Heart: rate in the high 90s to low 100s, appears fairly regular at the time of my exam, pedal pulses 2+ BL Lungs: HFNC at 30 L/min  Abd: soft, NT, ND, no masses, hernias, or organomegaly GU: foley present  MS: trace edema to BLE Skin: warm and dry with no masses, lesions, or rashes Psych: lethargic   Lab Results:  Recent Labs    07/11/23 0345 07/12/23 0318  WBC 20.1* 15.2*  HGB 10.2* 10.4*  HCT 31.7* 32.4*  PLT 314 358   BMET Recent Labs    07/11/23 0345 07/12/23 0318  NA 137 137  K 3.0* 3.5  CL 89* 93*  CO2 35* 33*  GLUCOSE 205* 188*  BUN 27* 28*  CREATININE 0.86 0.84  CALCIUM 8.8* 9.2   PT/INR No results for input(s): "LABPROT", "INR" in the last 72 hours. CMP     Component Value Date/Time   NA 137 07/12/2023 0318  K 3.5 07/12/2023 0318   CL 93 (L) 07/12/2023 0318   CO2 33 (H) 07/12/2023 0318   GLUCOSE 188 (H) 07/12/2023 0318   BUN 28 (H) 07/12/2023 0318   CREATININE 0.84 07/12/2023 0318   CREATININE 0.72 02/16/2023 1045   CALCIUM 9.2 07/12/2023 0318   PROT 6.2 (L) 06/29/2023 1209   ALBUMIN  3.7 06/29/2023 1209   AST 19 06/29/2023 1209   ALT 20 06/29/2023 1209    ALKPHOS 44 06/29/2023 1209   BILITOT 1.2 06/29/2023 1209   GFRNONAA >60 07/12/2023 0318   GFRNONAA 75 04/03/2020 1030   GFRAA 87 04/03/2020 1030   Lipase     Component Value Date/Time   LIPASE 24 06/28/2023 1806       Studies/Results: DG Chest Port 1 View Result Date: 07/10/2023 CLINICAL DATA:  Status post PICC placement. EXAM: PORTABLE CHEST 1 VIEW COMPARISON:  07/09/2023 chest CTA dated 06/30/2023. FINDINGS: Enlarged cardiac silhouette with an interval increase in size. No significant change in bilateral patchy and confluent densities, most pronounced in the lower lobes. No significant change in small bilateral pleural effusions. Interval right PICC with its tip 2 cm inferior to the superior cavoatrial junction. Feeding tube extending into the stomach, with its tip not included. No acute bony abnormality. IMPRESSION: 1. Interval right PICC with its tip 2 cm inferior to the superior cavoatrial junction. 2. No significant change in bilateral patchy and confluent densities, most pronounced in the lower lobes, most compatible with pneumonia and atelectasis. 3. No significant change in small bilateral pleural effusions. 4. Cardiomegaly with an interval increase in size. Electronically Signed   By: Catherin Closs M.D.   On: 07/10/2023 17:37    Anti-infectives: Anti-infectives (From admission, onward)    Start     Dose/Rate Route Frequency Ordered Stop   07/09/23 0945  piperacillin -tazobactam (ZOSYN ) IVPB 3.375 g        3.375 g 12.5 mL/hr over 240 Minutes Intravenous Every 8 hours 07/09/23 0845     07/05/23 0915  cefTRIAXone  (ROCEPHIN ) 2 g in sodium chloride  0.9 % 100 mL IVPB  Status:  Discontinued        2 g 200 mL/hr over 30 Minutes Intravenous Every 24 hours 07/05/23 0815 07/09/23 0845   06/30/23 2100  piperacillin -tazobactam (ZOSYN ) IVPB 3.375 g       Placed in "Followed by" Linked Group   3.375 g 12.5 mL/hr over 240 Minutes Intravenous Every 8 hours 06/30/23 1356 07/03/23 0016    06/30/23 1445  piperacillin -tazobactam (ZOSYN ) IVPB 3.375 g       Placed in "Followed by" Linked Group   3.375 g 100 mL/hr over 30 Minutes Intravenous  Once 06/30/23 1356 06/30/23 1626        Assessment/Plan  Hiatal hernia with oozing Cameron erosions leaking to UGIB Spigelian hernia containing ascending colon  - will plan operative repair of at least [facility] this admission but awaiting medical stability for OR - hgb currently stable at 10.4, LD eliquis  was 4/27 recommend holding. Ok for heparin  gtt - currently tolerating TF, continue this or PO when able - will follow peripherally for now until more appropriate for surgical intervention  FEN: TF via Cortrak, NPO VTE: hold eliquis , ok for heparin  gtt if needed ID: [email or web address]  4/25>> Foley: present   - per CCM -  Acute hypoxic and hypercapnic respiratory failure secondary to aspiration PNA  ICU delirium Drug related hypotension A. Fib on Eliquis  with RVR - A. Fib more controlled now, hold eliquis  Hyperglycemia  Severe  deconditioning   LOS: 12 days   I reviewed last 24 h vitals and pain scores, last 48 h intake and output, last 24 h labs and trends, last 24 h imaging results, and PCCM notes .  This care required moderate level of medical decision making.    Annetta Killian, [facility] Surgery 07/12/2023, 1:46 PM Please see Amion for pager number during day hours 7:00am-4:30pm

## 2023-07-12 NOTE — Progress Notes (Signed)
 Physical Therapy Treatment Patient Details Name: ABEL SAENGER MRN: [id number] DOB: 1941-02-19 Today's Date: 07/12/2023   History of Present Illness 83 yo female admitted 06/29/23 with acute hypoxic hypercapnic respiratory failure in the setting of aspiration complicated by GI bleed. Intubated 4/16-4/20. 4/17 EGD. PMhx: depression, DJD, OSA, pulmonary HTN, obesity, HLD, Afib    PT Comments  Pt reports very fatigued after today and able to work EOB but could not tolerate OOB. Patient will benefit from continued inpatient follow up therapy, <3 hours/day.     If plan is discharge home, recommend the following: A lot of help with bathing/dressing/bathroom;Assistance with cooking/housework;Direct supervision/assist for financial management;Assist for transportation;Two people to help with walking and/or transfers;Direct supervision/assist for medications management   Can travel by private vehicle     No  Equipment Recommendations  Hospital bed;Wheelchair (measurements PT);Hoyer lift    Recommendations for Other Services       Precautions / Restrictions Precautions Precautions: Fall;Other (comment) Recall of Precautions/Restrictions: Impaired Precaution/Restrictions Comments: HHFNC, watch SPO2, cortrak, incontinent Restrictions Weight Bearing Restrictions Per Provider Order: No     Mobility  Bed Mobility Overal bed mobility: Needs Assistance Bed Mobility: Supine to Sit, Sit to Supine     Supine to sit: Mod assist, +2 for physical assistance Sit to supine: Mod assist, +2 for physical assistance   General bed mobility comments: Assist to bring legs off of bed, elevate trunk into sitting and bring hips to EOB. Assist to lower trunk and bring legs up into bed returning to supine.    Transfers                   General transfer comment: Pt declined due to fatigue    Ambulation/Gait                   Stairs             Wheelchair Mobility     Tilt  Bed    Modified Rankin (Stroke Patients Only)       Balance Overall balance assessment: Needs assistance Sitting-balance support: Bilateral upper extremity supported, Feet supported Sitting balance-Leahy Scale: Fair Sitting balance - Comments: intermitting min A, incr as she fatigues. Worked on incr trunk and neck extension in sitting with manual facilitation                                    Communication Communication Communication: No apparent difficulties  Cognition Arousal: Lethargic, Alert (keeping eyes closed but follows commands/responds to questions asked) Behavior During Therapy: Flat affect   PT - Cognitive impairments: Awareness, Problem solving, Initiation, Safety/Judgement                         Following commands: Impaired Following commands impaired: Follows one step commands with increased time    Cueing Cueing Techniques: Verbal cues, Gestural cues, Tactile cues  Exercises General Exercises - Lower Extremity Long Arc Quad: AROM, Both, 10 reps, Seated    General Comments General comments (skin integrity, edema, etc.): Pt on HHFNC with SpO2 87% at lowest and primarily 90-92%      Pertinent Vitals/Pain Pain Assessment Pain Assessment: Faces Faces Pain Scale: Hurts little more Pain Location: arms and legs Pain Descriptors / Indicators: Discomfort Pain Intervention(s): Limited activity within patient's tolerance, Monitored during session, Repositioned    Home Living  Prior Function            PT Goals (current goals can now be found in the care plan section) Progress towards PT goals: Not progressing toward goals - comment    Frequency    Min 2X/week      PT Plan      Co-evaluation PT/OT/SLP Co-Evaluation/Treatment: Yes Reason for Co-Treatment: Complexity of the patient's impairments (multi-system involvement);To address functional/ADL transfers;For patient/therapist safety PT  goals addressed during session: Mobility/safety with mobility;Balance;Strengthening/ROM        AM-PAC PT "6 Clicks" Mobility   Outcome Measure  Help needed turning from your back to your side while in a flat bed without using bedrails?: Total Help needed moving from lying on your back to sitting on the side of a flat bed without using bedrails?: Total Help needed moving to and from a bed to a chair (including a wheelchair)?: Total Help needed standing up from a chair using your arms (e.g., wheelchair or bedside chair)?: Total Help needed to walk in hospital room?: Total Help needed climbing 3-5 steps with a railing? : Total 6 Click Score: 6    End of Session Equipment Utilized During Treatment: Oxygen Activity Tolerance: Patient limited by fatigue Patient left: in bed;with call bell/phone within reach;with bed alarm set Nurse Communication: Mobility status PT Visit Diagnosis: Unsteadiness on feet (R26.81);Other abnormalities of gait and mobility (R26.89);Difficulty in walking, not elsewhere classified (R26.2);Muscle weakness (generalized) (M62.81)     Time: 5784-6962 PT Time Calculation (min) (ACUTE ONLY): 24 min  Charges:    $Therapeutic Activity: 8-22 mins PT General Charges $$ ACUTE PT VISIT: 1 Visit                     [facility] PT Acute Rehabilitation Services Office [phone]    Pura Browns [facility] 07/12/2023, 4:24 PM

## 2023-07-13 ENCOUNTER — Inpatient Hospital Stay (HOSPITAL_COMMUNITY)

## 2023-07-13 DIAGNOSIS — K5900 Constipation, unspecified: Secondary | ICD-10-CM

## 2023-07-13 DIAGNOSIS — I952 Hypotension due to drugs: Secondary | ICD-10-CM | POA: Diagnosis not present

## 2023-07-13 DIAGNOSIS — J90 Pleural effusion, not elsewhere classified: Secondary | ICD-10-CM

## 2023-07-13 DIAGNOSIS — G4733 Obstructive sleep apnea (adult) (pediatric): Secondary | ICD-10-CM

## 2023-07-13 DIAGNOSIS — I4891 Unspecified atrial fibrillation: Secondary | ICD-10-CM | POA: Diagnosis not present

## 2023-07-13 DIAGNOSIS — K922 Gastrointestinal hemorrhage, unspecified: Secondary | ICD-10-CM | POA: Diagnosis not present

## 2023-07-13 LAB — PROCALCITONIN: Procalcitonin: 0.12 ng/mL

## 2023-07-13 LAB — CBC
HCT: 31.1 % — ABNORMAL LOW (ref 36.0–46.0)
Hemoglobin: 10 g/dL — ABNORMAL LOW (ref 12.0–15.0)
MCH: 31.4 pg (ref 26.0–34.0)
MCHC: 32.2 g/dL (ref 30.0–36.0)
MCV: 97.8 fL (ref 80.0–100.0)
Platelets: 390 10*3/uL (ref 150–400)
RBC: 3.18 MIL/uL — ABNORMAL LOW (ref 3.87–5.11)
RDW: 13.5 % (ref 11.5–15.5)
WBC: 9.7 10*3/uL (ref 4.0–10.5)
nRBC: 0 % (ref 0.0–0.2)

## 2023-07-13 LAB — BASIC METABOLIC PANEL WITH GFR
Anion gap: 12 (ref 5–15)
BUN: 31 mg/dL — ABNORMAL HIGH (ref 8–23)
CO2: 33 mmol/L — ABNORMAL HIGH (ref 22–32)
Calcium: 9 mg/dL (ref 8.9–10.3)
Chloride: 91 mmol/L — ABNORMAL LOW (ref 98–111)
Creatinine, Ser: 0.82 mg/dL (ref 0.44–1.00)
GFR, Estimated: >60 mL/min (ref >60)
Glucose, Bld: 199 mg/dL — ABNORMAL HIGH (ref 70–99)
Potassium: 3.3 mmol/L — ABNORMAL LOW (ref 3.5–5.1)
Sodium: 136 mmol/L (ref 135–145)

## 2023-07-13 LAB — GLUCOSE, CAPILLARY
Glucose-Capillary: 123 mg/dL — ABNORMAL HIGH (ref 70–99)
Glucose-Capillary: 127 mg/dL — ABNORMAL HIGH (ref 70–99)
Glucose-Capillary: 139 mg/dL — ABNORMAL HIGH (ref 70–99)
Glucose-Capillary: 146 mg/dL — ABNORMAL HIGH (ref 70–99)
Glucose-Capillary: 160 mg/dL — ABNORMAL HIGH (ref 70–99)
Glucose-Capillary: 168 mg/dL — ABNORMAL HIGH (ref 70–99)

## 2023-07-13 LAB — MAGNESIUM: Magnesium: 1.9 mg/dL (ref 1.7–2.4)

## 2023-07-13 LAB — PHOSPHORUS: Phosphorus: 3.8 mg/dL (ref 2.5–4.6)

## 2023-07-13 LAB — BRAIN NATRIURETIC PEPTIDE: B Natriuretic Peptide: 117.7 pg/mL — ABNORMAL HIGH (ref 0.0–100.0)

## 2023-07-13 MED ORDER — POTASSIUM CHLORIDE 20 MEQ PO PACK
40.0000 meq | PACK | Freq: Once | ORAL | Status: AC
  Administered 2023-07-13: 40 meq
  Filled 2023-07-13: qty 2

## 2023-07-13 MED ORDER — APIXABAN 5 MG PO TABS: 5.0000 mg | ORAL_TABLET | Freq: Two times a day (BID) | ORAL | Status: DC

## 2023-07-13 MED ORDER — POTASSIUM CHLORIDE 20 MEQ PO PACK
20.0000 meq | PACK | ORAL | Status: AC
  Administered 2023-07-13 (×2): 20 meq
  Filled 2023-07-13 (×2): qty 1

## 2023-07-13 MED ORDER — AMIODARONE HCL 200 MG PO TABS
200.0000 mg | ORAL_TABLET | Freq: Every day | ORAL | Status: DC
  Administered 2023-07-13 – 2023-07-26 (×13): 200 mg
  Filled 2023-07-13 (×13): qty 1

## 2023-07-13 MED ORDER — POTASSIUM CHLORIDE 10 MEQ/50ML IV SOLN
10.0000 meq | INTRAVENOUS | Status: AC
  Administered 2023-07-13 (×4): 10 meq via INTRAVENOUS
  Filled 2023-07-13 (×4): qty 50

## 2023-07-13 MED ORDER — AMIODARONE HCL 200 MG PO TABS: 200.0000 mg | ORAL_TABLET | Freq: Two times a day (BID) | ORAL | Status: DC

## 2023-07-13 MED ORDER — AMIODARONE HCL 200 MG PO TABS: 200.0000 mg | ORAL_TABLET | Freq: Every day | ORAL | Status: DC

## 2023-07-13 MED ORDER — MAGNESIUM SULFATE 2 GM/50ML IV SOLN
2.0000 g | Freq: Once | INTRAVENOUS | Status: AC
  Administered 2023-07-13: 2 g via INTRAVENOUS
  Filled 2023-07-13: qty 50

## 2023-07-13 MED ORDER — APIXABAN 5 MG PO TABS
5.0000 mg | ORAL_TABLET | Freq: Two times a day (BID) | ORAL | Status: DC
  Administered 2023-07-13 – 2023-07-15 (×4): 5 mg
  Filled 2023-07-13 (×4): qty 1

## 2023-07-13 NOTE — Progress Notes (Signed)
 Nutrition Follow-up  DOCUMENTATION CODES:  Not applicable  INTERVENTION:  Continue TF via Cortrak: Vital 1.5 at 52ml/hr ( per day) 60ml ProSource TF20 once daily TF at goal provides: 1700kcal, 93g protein and free water  daily  NUTRITION DIAGNOSIS:  Inadequate oral intake related to altered GI function (large hiatal hernia; cameron erosions) as evidenced by NPO status. - remains applicable  GOAL:  Patient will meet greater than or equal to 90% of their needs - goal met via TF  MONITOR:  Vent status, Labs, Weight trends, TF tolerance  REASON FOR ASSESSMENT:  Ventilator, Consult Enteral/tube feeding initiation and management  ASSESSMENT:  Pt admitted with coffee ground emesis. PMH significant for afib on eliquis , OSA, HTN, HLD, GERD and esophageal stricture.  4/15: admit with coffee ground emesis 4/16: developed afib w/RVR, concern for pulmonary edema, transfer to ICU; intubated 4/17: EGD- large hiatal hernia with oozing cameron erosions; large residue and coffee ground material in hiatal hernia 4/18: cortrak placed; tip in distal stomach past hiatal hernia 4/20: extubated  4/28: thoracentesis-  yield fluid  Surgery consulted for hiatal hernia repair.  Plans for operative repair during admission with G-tube placement for nutrition but awaiting medical stability.   Continues with TF at goal via Cortrak.  No reported nausea or vomiting since last follow up.  Checked in with pt at bedside. She is in good spirits and denies and intolerance to TF though does report intermittent difficulty passing bowels. She states that she feels she has to have a BM but then can't. Pt currently on bowel regimen and last BM today. Will monitor.   Admit weight: 103.7 kg Current weight: 96.5 kg Has been requiring diuresis throughout admission.  + edema: non-pitting generalized, BUE, BLE  Drains/lines:  PICC line UOP: x24 hours Cortrak  Medications: colace BID, lasix   40mg  BID, SSI 0-20 units q4h, melatonin, miralax  daily, klor-con  Drips: Levo @ 2mcg/min Abx Potassium chloride    Labs:  Potassium 3.3 BUN 31 CBG's 131-160 x24 hours  Diet Order:   Diet Order             Diet NPO time specified  Diet effective now                   EDUCATION NEEDS:  Education needs have been addressed  Skin:  Skin Assessment: Reviewed RN Assessment  Last BM:  4/29 type 7 medium  Height:  Ht Readings from Last 1 Encounters:  07/02/23 5\' 10"  (1.778 m)    Weight:  Wt Readings from Last 1 Encounters:  07/11/23 96.5 kg    Ideal Body Weight:  68.2 kg  BMI:  Body mass index is 30.53 kg/m.  Estimated Nutritional Needs:   Kcal:  1700-1900  Protein:  85-100g  Fluid:  >/=1.7L  Rocklin Chute, RDN, LDN Clinical Nutrition See AMiON for contact information.

## 2023-07-13 NOTE — Progress Notes (Signed)
 [facility] ADULT ICU REPLACEMENT PROTOCOL   The patient does apply for the [facility] Adult ICU Electrolyte Replacment Protocol based on the criteria listed below:   1.Exclusion criteria: TCTS, ECMO, Dialysis, and Myasthenia Gravis patients 2. Is GFR >/= 30 ml/min? Yes.    Patient's GFR today is >60 3. Is SCr </= 2? Yes.   Patient's SCr is 0.82 mg/dL 4. Did SCr increase >/= 0.5 in 24 hours? No. 5.Pt's weight >40kg  Yes.   6. Abnormal electrolyte(s): potassium 3.3, mag 1.9  7. Electrolytes replaced per protocol 8.  Call MD STAT for K+ </= 2.5, Phos </= 1, or Mag </= 1 Physician:  protocol  Marva Sleight 07/13/2023 5:37 AM

## 2023-07-13 NOTE — Plan of Care (Signed)
  Problem: Education: Goal: Knowledge of General Education information will improve Description: Including pain rating scale, medication(s)/side effects and non-pharmacologic comfort measures Outcome: Progressing   Problem: Health Behavior/Discharge Planning: Goal: Ability to manage health-related needs will improve Outcome: Progressing   Problem: Clinical Measurements: Goal: Ability to maintain clinical measurements within normal limits will improve Outcome: Progressing Goal: Will remain free from infection Outcome: Progressing Goal: Diagnostic test results will improve Outcome: Progressing Goal: Respiratory complications will improve Outcome: Progressing Goal: Cardiovascular complication will be avoided Outcome: Progressing   Problem: Activity: Goal: Risk for activity intolerance will decrease Outcome: Progressing   Problem: Nutrition: Goal: Adequate nutrition will be maintained Outcome: Progressing   Problem: Coping: Goal: Level of anxiety will decrease Outcome: Progressing   Problem: Elimination: Goal: Will not experience complications related to bowel motility Outcome: Progressing Goal: Will not experience complications related to urinary retention Outcome: Progressing   Problem: Pain Managment: Goal: General experience of comfort will improve and/or be controlled Outcome: Progressing   Problem: Safety: Goal: Ability to remain free from injury will improve Outcome: Progressing   Problem: Skin Integrity: Goal: Risk for impaired skin integrity will decrease Outcome: Progressing   Problem: Education: Goal: Knowledge of disease or condition will improve Outcome: Progressing Goal: Understanding of medication regimen will improve Outcome: Progressing Goal: Individualized Educational Video(s) Outcome: Progressing   Problem: Activity: Goal: Ability to tolerate increased activity will improve Outcome: Progressing   Problem: Cardiac: Goal: Ability to achieve  and maintain adequate cardiopulmonary perfusion will improve Outcome: Progressing   Problem: Health Behavior/Discharge Planning: Goal: Ability to safely manage health-related needs after discharge will improve Outcome: Progressing   Problem: Activity: Goal: Ability to tolerate increased activity will improve Outcome: Progressing   Problem: Respiratory: Goal: Ability to maintain a clear airway and adequate ventilation will improve Outcome: Progressing   Problem: Role Relationship: Goal: Method of communication will improve Outcome: Progressing   Problem: Education: Goal: Ability to describe self-care measures that may prevent or decrease complications (Diabetes Survival Skills Education) will improve Outcome: Progressing Goal: Individualized Educational Video(s) Outcome: Progressing   Problem: Coping: Goal: Ability to adjust to condition or change in health will improve Outcome: Progressing   Problem: Fluid Volume: Goal: Ability to maintain a balanced intake and output will improve Outcome: Progressing   Problem: Health Behavior/Discharge Planning: Goal: Ability to identify and utilize available resources and services will improve Outcome: Progressing Goal: Ability to manage health-related needs will improve Outcome: Progressing   Problem: Metabolic: Goal: Ability to maintain appropriate glucose levels will improve Outcome: Progressing   Problem: Nutritional: Goal: Maintenance of adequate nutrition will improve Outcome: Progressing Goal: Progress toward achieving an optimal weight will improve Outcome: Progressing   Problem: Skin Integrity: Goal: Risk for impaired skin integrity will decrease Outcome: Progressing   Problem: Tissue Perfusion: Goal: Adequacy of tissue perfusion will improve Outcome: Progressing

## 2023-07-13 NOTE — Progress Notes (Signed)
 NAME:  Darlene [name], MRN:  [id number], DOB:  08-Jun-1940, LOS: 13 ADMISSION DATE:  06/29/2023, CONSULTATION DATE:  06/30/2023 REFERRING MD: TRH, CHIEF COMPLAINT: afib/flutter RVR    History of Present Illness:  Darlene [name] is a 83 y.o. F with PMH significant for atrial fibrillation on eliquis , OSA on CPAP, HTN, HL, GERD and esophageal stricture who presented to the ED 4/15 with coffee ground emesis.  This initially began about one week before discharge and she was supposed to follow up for a scope outpatient for this, but was told this would need to be done inpatient.  CTA abd/pelvis did not reveal any acute source of bleeding, did show high-grade stenosis involving celiac access with collateral flow from the SMA.  Patient is already scheduled for hiatal hernia and spigelian hernia repair with Dr. Jamse Mcgee in 07/2023.  Pt has not required any transfusions    She was admitted and the plan was to head to EGD and then be discharged, however she has been NPO and her rate controlling agents were held and pt developed Afib with RVR and hypoxia/hypercarbia.  She was placed on bipap and given her recent emesis PCCM was consulted.  Family agreed to intubation and pt started on a cardizem  gtt   Pertinent  Medical History  Anxiety, Chronic lower back pain, Depression, DJD (degenerative joint disease), GERD (gastroesophageal reflux disease), and Hypertension.   Significant Hospital Events: Including procedures, antibiotic start and stop dates in addition to other pertinent events   4/15 admit with coffee ground emesis for inpatient EGD 4/16 afib with RVR, concern for pulm edema, PCCM consult and ICU txfr 4/17: ETT air leak, fixed with more balloon inflation. On Precedex  and Fentanyl  drips for sedation. Controlled Afib 4/21 Complaints of epigastric pain vs delirium overnight, still on amio gtt/heparin . Tube feeds paused this early this morning.   4/22 Delirium improving, patient had BM, Pulmonary Edema vs BL  infiltrates on CXR-, CVP 18 -diuresed with lasix  and metolazone  4/24 Diuresed 3.5 L with metolazone /Lasix  4/26 Placed on BiPAP for increased WOB 4/27 Point-of-care echo showed moderate to severe RV dysfunction.  LV function appears normal.  There is consolidation at both bases with a trivial effusion unsuitable for thoracentesis.  Started on norepinephrine  to support blood pressure as patient required Precedex  for delirium, which exacerbated hypotension 4/28 thoracentesis. Right 100 ml. Exudate by lights. Likely parapneumonic  4/29 off norepi PCXR improved. WBC nml pct neg stopped abx. Feeling better   Interim History / Subjective:  Feels better  Objective   Blood pressure (!) 103/44, pulse 84, temperature 98.2 F (36.8 C), temperature source Oral, resp. rate (!) 27, height 5\' 10"  (1.778 m), weight 96.5 kg, SpO2 91%. CVP:  [7 mmHg-11 mmHg] 9 mmHg  Vent Mode: BIPAP;PCV FiO2 (%):  [40 %-50 %] 40 % Set Rate:  [14 bmp] 14 bmp PEEP:  [8 cmH20] 8 cmH20   Intake/Output Summary (Last 24 hours) at 07/13/2023 0738 Last data filed at 07/13/2023 0600 Gross per 24 hour  Intake 1588.47 ml  Output 1875 ml  Net -286.53 ml   Filed Weights   07/04/23 0544 07/05/23 0522 07/11/23 0500  Weight: 99.5 kg 98 kg 96.5 kg    Examination: General sitting up no distress HENT NCAT no JVD Pulm basilar crackles no accessory use  PCXR continues to improve. R>L airspace disease. The effusion improved/resolved Card irreg irreg af on tele Abd soft Ext warm dry no sig edema brisk CR Neuro intact today  Gu cl yellow  Resolved problem list  ICU delirium Assessment & Plan:  Acute hypoxic and hypercapnic respiratory failure- secondary to aspiration pneumonia in setting of large hiatal hernia, complicated by poor cough mechanics as well as volume overload We are making progress on her fluid volume status. Nearing euvolemia  Plan Wean heated high flow to [place]. Will watch her pulse ox. Hold off on CPAP (was on this  at home) feel like asp risk pretty high still  Continue to push diuresis as BUN and blood pressure and creatinine allow Today is day #5 Zosyn , leukocytosis cleared, PCT reassuring. I think we can stop the zosyn  as she's had ~ 9 days of abx already and has been culture neg Trend PCT and BNP Keep NPO; it think swallow eval should only be considered after repair of her hiatal hernia HOB elevated   Right sided pleural effusion: exudative by lights criteria.  Looks c/w parapneumonic  Plan F/u cultures  Hiatal hernia with oozing Cameron erosions leading to UGIB Hemoglobin has been stable she is back on her DOAC  plan Continuing tube feeds via core track Trend CBC Continue PPI twice daily Appreciate Dr Lanell Pinta. At his time we will continue with "repairing the hiatal hernia with placement of a feeding gastrostomy tube at that time and likely deferring the right lower quadrant abdominal wall hernia repair until she is further recovered. "  Drug related hypotension Plan Continue midodrine at 10mg  tid  Cont NE gtt. Change titration goal to SBP > 90  Afib on AC PTA Plan Continue telemetry monitoring  Transition to oral amio VT Resume her DOAC for now. Note that this will need to be held prior to procedures/surgeries Holding her dilt and BB as contributed to hypotension  Keep K > 4 and Mg > 2  Severe deconditioning.  Plan Continue PT/OT Will cont to get her up q shift  Hyperglycemia Plan Cont ssi Goal glucose 140-180  Best Practice (right click and "Reselect all SmartList Selections" daily)   Diet/type: TF DVT prophylaxis heparin  gtt-transition to Eliquis  4/22 Pressure ulcer(s): N/A GI prophylaxis: PPI Lines: Central line Foley:  Yes, and it is still needed Code Status:  full code Last date of multidisciplinary goals of care discussion [updated family-daughter at bedside on 4/25  CRITICAL CARE NA Performed by: Armstead Bertrand

## 2023-07-14 DIAGNOSIS — K449 Diaphragmatic hernia without obstruction or gangrene: Secondary | ICD-10-CM

## 2023-07-14 DIAGNOSIS — J810 Acute pulmonary edema: Secondary | ICD-10-CM

## 2023-07-14 DIAGNOSIS — I952 Hypotension due to drugs: Secondary | ICD-10-CM | POA: Diagnosis not present

## 2023-07-14 DIAGNOSIS — I4891 Unspecified atrial fibrillation: Secondary | ICD-10-CM | POA: Diagnosis not present

## 2023-07-14 DIAGNOSIS — J90 Pleural effusion, not elsewhere classified: Secondary | ICD-10-CM | POA: Diagnosis not present

## 2023-07-14 DIAGNOSIS — K922 Gastrointestinal hemorrhage, unspecified: Secondary | ICD-10-CM | POA: Diagnosis not present

## 2023-07-14 LAB — GLUCOSE, CAPILLARY
Glucose-Capillary: 139 mg/dL — ABNORMAL HIGH (ref 70–99)
Glucose-Capillary: 137 mg/dL — ABNORMAL HIGH (ref 70–99)
Glucose-Capillary: 139 mg/dL — ABNORMAL HIGH (ref 70–99)
Glucose-Capillary: 141 mg/dL — ABNORMAL HIGH (ref 70–99)
Glucose-Capillary: 119 mg/dL — ABNORMAL HIGH (ref 70–99)

## 2023-07-14 LAB — CYTOLOGY - NON PAP

## 2023-07-14 LAB — BRAIN NATRIURETIC PEPTIDE: B Natriuretic Peptide: 137.8 pg/mL — ABNORMAL HIGH (ref 0.0–100.0)

## 2023-07-14 LAB — BASIC METABOLIC PANEL WITH GFR
Anion gap: 11 (ref 5–15)
BUN: 28 mg/dL — ABNORMAL HIGH (ref 8–23)
CO2: 31 mmol/L (ref 22–32)
Calcium: 9 mg/dL (ref 8.9–10.3)
Chloride: 94 mmol/L — ABNORMAL LOW (ref 98–111)
Creatinine, Ser: 0.69 mg/dL (ref 0.44–1.00)
GFR, Estimated: >60 mL/min (ref >60)
Glucose, Bld: 146 mg/dL — ABNORMAL HIGH (ref 70–99)
Potassium: 3.5 mmol/L (ref 3.5–5.1)
Sodium: 136 mmol/L (ref 135–145)

## 2023-07-14 LAB — PROCALCITONIN: Procalcitonin: <0.1 ng/mL

## 2023-07-14 LAB — MAGNESIUM: Magnesium: 2.1 mg/dL (ref 1.7–2.4)

## 2023-07-14 MED ORDER — IPRATROPIUM-ALBUTEROL 0.5-2.5 (3) MG/3ML IN SOLN: 3.0000 mL | RESPIRATORY_TRACT | Status: DC | PRN

## 2023-07-14 MED ORDER — POTASSIUM CHLORIDE 20 MEQ PO PACK
40.0000 meq | PACK | Freq: Once | ORAL | Status: AC
  Administered 2023-07-14: 40 meq
  Filled 2023-07-14: qty 2

## 2023-07-14 NOTE — Progress Notes (Signed)
 [facility] ADULT ICU REPLACEMENT PROTOCOL   The patient does apply for the [facility] Adult ICU Electrolyte Replacment Protocol based on the criteria listed below:   1.Exclusion criteria: TCTS, ECMO, Dialysis, and Myasthenia Gravis patients 2. Is GFR >/= 30 ml/min? Yes.    Patient's GFR today is >60 3. Is SCr </= 2? Yes.   Patient's SCr is 0.69 mg/dL 4. Did SCr increase >/= 0.5 in 24 hours? No. 5.Pt's weight >40kg  Yes.   6. Abnormal electrolyte(s): potassium 3.5  7. Electrolytes replaced per protocol 8.  Call MD STAT for K+ </= 2.5, Phos </= 1, or Mag </= 1 Physician:  protocol  Marva Sleight 07/14/2023 4:53 AM

## 2023-07-14 NOTE — Progress Notes (Signed)
 Physical Therapy Treatment Patient Details Name: Darlene [name] MRN: [id number] DOB: [birth year] Today's Date: 07/14/2023   History of Present Illness 83 yo female admitted 06/29/23 with acute hypoxic hypercapnic respiratory failure in the setting of aspiration complicated by GI bleed. Intubated 4/16-4/20. 4/17 EGD. PMhx: depression, DJD, OSA, pulmonary HTN, obesity, HLD, Afib    PT Comments  Pt received in supine, c/o fatigue but alert and oriented to self/situation/location and agreeable to therapy session with emphasis on transfer training and seated activity. Pt with SpO2 WFL on 3L O2 [place], desat with brief removal of [place] to reposition cords to 87%, mostly 88-94% on 3L with seated activity. HR elevated 110-135 bpm with sitting EOB and transfer to chair, pt needing minA for sit<>stand from ICU air bed surface and modA for pivotal steps toward chair on her L side and BUE support. Defer gait trial today due to pt c/o dizziness/nausea with static sitting and standing trial and quick to fatigue. PTA encouraged use of IS/FV. Patient will benefit from continued inpatient follow up therapy, <3 hours/day.     If plan is discharge home, recommend the following: A lot of help with bathing/dressing/bathroom;Assistance with cooking/housework;Direct supervision/assist for financial management;Assist for transportation;Two people to help with walking and/or transfers;Direct supervision/assist for medications management   Can travel by private vehicle     No  Equipment Recommendations  Hospital bed;Wheelchair (measurements PT);Hoyer lift    Recommendations for Other Services       Precautions / Restrictions Precautions Precautions: Fall;Other (comment) Recall of Precautions/Restrictions: Impaired Precaution/Restrictions Comments: watch SPO2, cortrak, incontinent Restrictions Weight Bearing Restrictions Per Provider Order: No     Mobility  Bed Mobility Overal bed mobility: Needs Assistance Bed  Mobility: Rolling, Sidelying to Sit Rolling: Contact guard assist, Used rails Sidelying to sit: Min assist, +2 for safety/equipment, HOB elevated, Used rails       General bed mobility comments: to R EOB; increased time to initiate/perform; c/o nausea + dizzy once sitting EOB    Transfers Overall transfer level: Needs assistance Equipment used: Straight cane, 1 person hand held assist Transfers: Sit to/from Stand, Bed to chair/wheelchair/BSC Sit to Stand: Min assist, +2 safety/equipment, From elevated surface   Step pivot transfers: Mod assist, +2 safety/equipment, From elevated surface       General transfer comment: step pivot toward recliner on her L side, needs more support than just cane but no RW in room so PTA providing her with HHA on LUE pt holding cane with RUE.    Ambulation/Gait               General Gait Details: pt too dizzy/nauseated and fatigued after pivot to chair   Stairs             Wheelchair Mobility     Tilt Bed    Modified Rankin (Stroke Patients Only)       Balance Overall balance assessment: Needs assistance Sitting-balance support: Bilateral upper extremity supported, Feet supported Sitting balance-Leahy Scale: Fair Sitting balance - Comments: Supervision at EOB   Standing balance support: Bilateral upper extremity supported, Reliant on assistive device for balance, During functional activity Standing balance-Leahy Scale: Poor Standing balance comment: cane and HHA/forearm support                            Communication Communication Communication: No apparent difficulties  Cognition Arousal: Alert (closing eyes more once in chair) Behavior During Therapy: Flat affect   PT -  Cognitive impairments: Awareness, Problem solving, Safety/Judgement                       PT - Cognition Comments: Slightly increased time to follow single step commands, pt appears slightly more alert in supine but once seated  tending to close her eye more and pt reports dizziness and some nausea; BP WFL Following commands: Impaired Following commands impaired: Follows one step commands with increased time    Cueing Cueing Techniques: Verbal cues, Gestural cues, Tactile cues  Exercises Other Exercises Other Exercises: pursed-lip breathing x5 reps x 2 sets    General Comments General comments (skin integrity, edema, etc.): BP checked seated and WFL, SpO2 88-93% on 3L HFNC HR tachy 110-135 bpm per tele      Pertinent Vitals/Pain Pain Assessment Pain Assessment: Faces Faces Pain Scale: Hurts little more Pain Location: generalized Pain Descriptors / Indicators: Discomfort, Grimacing Pain Intervention(s): Limited activity within patient's tolerance, Monitored during session, Repositioned    Home Living                          Prior Function            PT Goals (current goals can now be found in the care plan section) Acute Rehab PT Goals Patient Stated Goal: return home PT Goal Formulation: With patient/family Time For Goal Achievement: 07/20/23 Progress towards PT goals: Progressing toward goals    Frequency    Min 2X/week      PT Plan      Co-evaluation              AM-PAC PT "6 Clicks" Mobility   Outcome Measure  Help needed turning from your back to your side while in a flat bed without using bedrails?: A Lot Help needed moving from lying on your back to sitting on the side of a flat bed without using bedrails?: A Lot Help needed moving to and from a bed to a chair (including a wheelchair)?: A Lot Help needed standing up from a chair using your arms (e.g., wheelchair or bedside chair)?: A Lot Help needed to walk in hospital room?: Total Help needed climbing 3-5 steps with a railing? : Total 6 Click Score: 10    End of Session Equipment Utilized During Treatment: Oxygen;Gait belt Activity Tolerance: Patient tolerated treatment well;Patient limited by fatigue;Other  (comment) (+ nausea) Patient left: with call bell/phone within reach;in chair;with chair alarm set;with family/visitor present (daughter arriving) Nurse Communication: Mobility status PT Visit Diagnosis: Unsteadiness on feet (R26.81);Other abnormalities of gait and mobility (R26.89);Difficulty in walking, not elsewhere classified (R26.2);Muscle weakness (generalized) (M62.81)     Time: 9147-8295 PT Time Calculation (min) (ACUTE ONLY): 26 min  Charges:    $Therapeutic Activity: 23-37 mins PT General Charges $$ ACUTE PT VISIT: 1 Visit                     Martino Tompson P., PTA Acute Rehabilitation Services Secure Chat Preferred 9a-5:30pm Office: [phone]    Mariel Shope [facility] 07/14/2023, 4:51 PM

## 2023-07-14 NOTE — Progress Notes (Signed)
 13 Days Post-Op   Subjective/Chief Complaint: Seems to be getting better, oxygen requirement is decreasing.  She did get out of bed 1 time yesterday, seems to be tolerating tube feeds.   Objective: Vital signs in last 24 hours: Temp:  [97.8 F (36.6 C)-98.5 F (36.9 C)] 98.3 F (36.8 C) (04/30 1118) Pulse Rate:  [91-121] 111 (04/30 1110) Resp:  [16-28] 17 (04/30 1110) BP: (90-128)/(37-110) 103/50 (04/30 1110) SpO2:  [90 %-100 %] 98 % (04/30 1110) FiO2 (%):  [45 %] 45 % (04/29 2340) Last BM Date : 07/14/23  Intake/Output from previous day: 04/29 0701 - 04/30 0700 In: 1620.2 [I.V.:37.1; NG/GT:1310; IV Piggyback:273.1] Out: 2135 [Urine:2135] Intake/Output this shift: Total I/O In: 270 [NG/GT:270] Out: 550 [Urine:550]  Alert, appears debilitated but in no distress Unlabored respirations on nasal cannula Abdomen is benign  Lab Results:  Recent Labs    07/12/23 0318 07/13/23 0405  WBC 15.2* 9.7  HGB 10.4* 10.0*  HCT 32.4* 31.1*  PLT 358 390   BMET Recent Labs    07/13/23 0405 07/14/23 0327  NA 136 136  K 3.3* 3.5  CL 91* 94*  CO2 33* 31  GLUCOSE 199* 146*  BUN 31* 28*  CREATININE 0.82 0.69  CALCIUM 9.0 9.0   PT/INR No results for input(s): "LABPROT", "INR" in the last 72 hours. ABG No results for input(s): "PHART", "HCO3" in the last 72 hours.  Invalid input(s): "PCO2", "PO2"  Studies/Results: DG Chest Port 1 View Result Date: 07/13/2023 CLINICAL DATA:  Status post right thoracentesis EXAM: PORTABLE CHEST 1 VIEW COMPARISON:  Film from the previous day. FINDINGS: Cardiac shadow is stable. Stable large hiatal hernia is noted. Feeding catheter extends into the stomach. Right PICC is again seen and stable. Right basilar atelectasis is noted stable from the prior study. Some patchy airspace opacities are again noted bilaterally. IMPRESSION: No significant recurrent pleural effusion is noted. Stable airspace opacities bilaterally. Electronically Signed   By: Violeta Grey M.D.   On: 07/13/2023 08:06   DG Chest Port 1 View Result Date: 07/12/2023 CLINICAL DATA:  Status post thoracentesis EXAM: PORTABLE CHEST 1 VIEW COMPARISON:  07/09/2023 FINDINGS: Feeding tube extends through a large hiatal hernia 2 below the margin the film. PICC line unchanged. Interval reduction in RIGHT pleural fluid following thoracentesis. No pneumothorax. Patchy bilateral airspace disease is unchanged. IMPRESSION: 1. No pneumothorax following RIGHT thoracentesis. Reduction in pleural fluid. 2. Stable support apparatus. Electronically Signed   By: Deboraha Fallow M.D.   On: 07/12/2023 18:01    Anti-infectives: Anti-infectives (From admission, onward)    Start     Dose/Rate Route Frequency Ordered Stop   07/09/23 0945  piperacillin -tazobactam (ZOSYN ) IVPB 3.375 g  Status:  Discontinued        3.375 g 12.5 mL/hr over 240 Minutes Intravenous Every 8 hours 07/09/23 0845 07/13/23 0842   07/05/23 0915  cefTRIAXone  (ROCEPHIN ) 2 g in sodium chloride  0.9 % 100 mL IVPB  Status:  Discontinued        2 g 200 mL/hr over 30 Minutes Intravenous Every 24 hours 07/05/23 0815 07/09/23 0845   06/30/23 2100  piperacillin -tazobactam (ZOSYN ) IVPB 3.375 g       Placed in "Followed by" Linked Group   3.375 g 12.5 mL/hr over 240 Minutes Intravenous Every 8 hours 06/30/23 1356 07/03/23 0016   06/30/23 1445  piperacillin -tazobactam (ZOSYN ) IVPB 3.375 g       Placed in "Followed by" Linked Group   3.375 g 100 mL/hr over  30 Minutes Intravenous  Once 06/30/23 1356 06/30/23 1626       Assessment/Plan:  Hiatal hernia with oozing Cameron erosions leaking to UGIB Spigelian hernia containing ascending colon  - will plan operative repair of at least [facility] this admission but awaiting medical stability for OR as well as OR availability, timing remains TBD - hgb currently stable at 10.0, LD eliquis  was 4/27 recommend holding. Ok for heparin  gtt - currently tolerating TF, continue this or PO when able - will  follow peripherally for now until more appropriate for surgical intervention   FEN: TF via Cortrak, NPO VTE: hold eliquis , ok for heparin  gtt if needed ID: [email or web address]  4/25>> Foley: present    - per CCM -  Acute hypoxic and hypercapnic respiratory failure secondary to aspiration PNA  ICU delirium Drug related hypotension A. Fib on Eliquis  with RVR - A. Fib more controlled now, hold eliquis  Hyperglycemia  Severe deconditioning   LOS: 12 days    I reviewed last 24 h vitals and pain scores, last 48 h intake and output, last 24 h labs and trends, last 24 h imaging results, and PCCM notes .   This care required moderate level of medical decision making.    LOS: 14 days    Adalberto Acton 07/14/2023

## 2023-07-14 NOTE — Progress Notes (Addendum)
 NAME:  Darlene [name], MRN:  [id number], DOB:  01/25/1941, LOS: 14 ADMISSION DATE:  06/29/2023, CONSULTATION DATE:  06/30/2023 REFERRING MD: TRH, CHIEF COMPLAINT: afib/flutter RVR    History of Present Illness:  Darlene [name] is a 83 y.o. F with PMH significant for atrial fibrillation on eliquis , OSA on CPAP, HTN, HL, GERD and esophageal stricture who presented to the ED 4/15 with coffee ground emesis.  This initially began about one week before discharge and she was supposed to follow up for a scope outpatient for this, but was told this would need to be done inpatient.  CTA abd/pelvis did not reveal any acute source of bleeding, did show high-grade stenosis involving celiac access with collateral flow from the SMA.  Patient is already scheduled for hiatal hernia and spigelian hernia repair with Dr. Jamse Mcgee in 07/2023.  Pt has not required any transfusions    She was admitted and the plan was to head to EGD and then be discharged, however she has been NPO and her rate controlling agents were held and pt developed Afib with RVR and hypoxia/hypercarbia.  She was placed on bipap and given her recent emesis PCCM was consulted.  Family agreed to intubation and pt started on a cardizem  gtt   Pertinent  Medical History  Anxiety, Chronic lower back pain, Depression, DJD (degenerative joint disease), GERD (gastroesophageal reflux disease), and Hypertension.   Significant Hospital Events: Including procedures, antibiotic start and stop dates in addition to other pertinent events   4/15 admit with coffee ground emesis for inpatient EGD 4/16 afib with RVR, concern for pulm edema, PCCM consult and ICU txfr 4/17: ETT air leak, fixed with more balloon inflation. On Precedex  and Fentanyl  drips for sedation. Controlled Afib 4/21 Complaints of epigastric pain vs delirium overnight, still on amio gtt/heparin . Tube feeds paused this early this morning.   4/22 Delirium improving, patient had BM, Pulmonary Edema vs BL  infiltrates on CXR-, CVP 18 -diuresed with lasix  and metolazone  4/24 Diuresed 3.5 L with metolazone /Lasix  4/26 Placed on BiPAP for increased WOB 4/27 Point-of-care echo showed moderate to severe RV dysfunction.  LV function appears normal.  There is consolidation at both bases with a trivial effusion unsuitable for thoracentesis.  Started on norepinephrine  to support blood pressure as patient required Precedex  for delirium, which exacerbated hypotension 4/28 thoracentesis. Right 100 ml. Exudate by lights. Likely parapneumonic  4/29 off norepi PCXR improved. WBC nml pct neg stopped abx. Feeling better  4/30 weaned down to 4L [place]  Interim History / Subjective:  No complaints this morning.  Breathing well Abd still a little tender O2 down to 3L.   Objective   Blood pressure (!) 109/51, pulse 98, temperature 98.3 F (36.8 C), temperature source Oral, resp. rate (!) 28, height 5\' 10"  (1.778 m), weight 96.5 kg, SpO2 93%. CVP:  [3 mmHg-7 mmHg] 3 mmHg  Vent Mode: BIPAP;PCV FiO2 (%):  [45 %] 45 % Set Rate:  [17 bmp] 17 bmp Pressure Support:  [5 cmH20] 5 cmH20   Intake/Output Summary (Last 24 hours) at 07/14/2023 0820 Last data filed at 07/14/2023 0740 Gross per 24 hour  Intake 1498.7 ml  Output 2335 ml  Net -836.3 ml   Filed Weights   07/04/23 0544 07/05/23 0522 07/11/23 0500  Weight: 99.5 kg 98 kg 96.5 kg    Examination:   General:  elderly female in NAD Neuro:  Alert, oriented, non-focal HEENT:  [place]/AT, No JVD noted, PERRL Cardiovascular:  RRR, no MRG Lungs:  Coarse  crackles in bilateral bases Abdomen:  Soft, non-distended, mildly tender in the lower quadrants.  Musculoskeletal:  No acute deformity or ROM limitation Skin:  Intact, MMM   - Labs: K.3.5, BUN 28, Mag 2.1, PCT < 0.1, CBGs 120-160 range - I/O: 2.1 Liters out x 24 hours = negative.   Resolved problem list  ICU delirium Assessment & Plan:  Acute hypoxic and hypercapnic respiratory failure- secondary to  aspiration pneumonia in setting of large hiatal hernia, complicated by poor cough mechanics as well as volume overload We are making progress on her fluid volume status. Nearing euvolemia  Plan Weaned down to 3L [place]. Diuresing well and is well tolerated by her renal function. Continue lasix  40mg  BID with K supplementation.  Zosyn  off yesterday after 9 ABX days.  Continue feeds via cortrak Keep NPO; it think swallow eval should only be considered after repair of her hiatal hernia OOB to chair today Mobilize per PT.  IS/FV  Right sided pleural effusion: exudative by lights criteria.  Looks c/w parapneumonic  Plan F/u cultures  Hiatal hernia with oozing Cameron erosions leading to UGIB Hemoglobin has been stable she is back on her DOAC  Plan Continuing tube feeds via core track Trend CBC Continue PPI twice daily Dr. Lanell Pinta following. Plans are to repair hiatal hernia and place gastrostomy tube this admission once she is more stable. Most likely deferring RLQ hernia repair for now.   Drug related hypotension Plan Continue midodrine at 10mg  tid  Norepi off as of 4/29  Afib on [facility] PTA Plan Continue telemetry monitoring  Transition to enteral amiodarone  Eliquis  resumed  Holding her dilt and BB as contributed to hypotension  Keep K > 4 and Mg > 2  Severe deconditioning.  Plan Continue PT/OT Will cont to get her up q shift  Hyperglycemia Plan Cont SSI Goal glucose 140-180  Will plan to transfer out of ICU today  Best Practice (right click and "Reselect all SmartList Selections" daily)   Diet/type: TF DVT prophylaxis heparin  gtt-transition to Eliquis  4/22 Pressure ulcer(s): N/A GI prophylaxis: PPI Lines: Central line Foley:  Yes, and it is still needed Code Status:  full code Last date of multidisciplinary goals of care discussion [updated family-daughter at bedside on 4/25  CRITICAL CARE NA    Darlene [name], AGACNP-BC  Pulmonary & Critical Care  See  Amion for personal pager PCCM on call pager [phone] until 7pm. Please call Elink 7p-7a. [phone]  07/14/2023 8:39 AM

## 2023-07-15 DIAGNOSIS — K922 Gastrointestinal hemorrhage, unspecified: Secondary | ICD-10-CM | POA: Diagnosis not present

## 2023-07-15 LAB — GLUCOSE, CAPILLARY
Glucose-Capillary: 119 mg/dL — ABNORMAL HIGH (ref 70–99)
Glucose-Capillary: 124 mg/dL — ABNORMAL HIGH (ref 70–99)
Glucose-Capillary: 125 mg/dL — ABNORMAL HIGH (ref 70–99)
Glucose-Capillary: 129 mg/dL — ABNORMAL HIGH (ref 70–99)
Glucose-Capillary: 158 mg/dL — ABNORMAL HIGH (ref 70–99)
Glucose-Capillary: 172 mg/dL — ABNORMAL HIGH (ref 70–99)

## 2023-07-15 LAB — CBC
HCT: 32.5 % — ABNORMAL LOW (ref 36.0–46.0)
Hemoglobin: 10.3 g/dL — ABNORMAL LOW (ref 12.0–15.0)
MCH: 31.6 pg (ref 26.0–34.0)
MCHC: 31.7 g/dL (ref 30.0–36.0)
MCV: 99.7 fL (ref 80.0–100.0)
Platelets: 465 10*3/uL — ABNORMAL HIGH (ref 150–400)
RBC: 3.26 MIL/uL — ABNORMAL LOW (ref 3.87–5.11)
RDW: 13.4 % (ref 11.5–15.5)
WBC: 9.5 10*3/uL (ref 4.0–10.5)
nRBC: 0 % (ref 0.0–0.2)

## 2023-07-15 LAB — BASIC METABOLIC PANEL WITH GFR
Anion gap: 10 (ref 5–15)
BUN: 27 mg/dL — ABNORMAL HIGH (ref 8–23)
CO2: 30 mmol/L (ref 22–32)
Calcium: 9 mg/dL (ref 8.9–10.3)
Chloride: 98 mmol/L (ref 98–111)
Creatinine, Ser: 0.69 mg/dL (ref 0.44–1.00)
GFR, Estimated: >60 mL/min (ref >60)
Glucose, Bld: 114 mg/dL — ABNORMAL HIGH (ref 70–99)
Potassium: 3.7 mmol/L (ref 3.5–5.1)
Sodium: 138 mmol/L (ref 135–145)

## 2023-07-15 LAB — MAGNESIUM: Magnesium: 2.1 mg/dL (ref 1.7–2.4)

## 2023-07-15 LAB — BRAIN NATRIURETIC PEPTIDE: B Natriuretic Peptide: 161.3 pg/mL — ABNORMAL HIGH (ref 0.0–100.0)

## 2023-07-15 LAB — PHOSPHORUS: Phosphorus: 3 mg/dL (ref 2.5–4.6)

## 2023-07-15 MED ORDER — APIXABAN 5 MG PO TABS
5.0000 mg | ORAL_TABLET | Freq: Two times a day (BID) | ORAL | Status: AC
  Administered 2023-07-15: 5 mg
  Filled 2023-07-15: qty 1

## 2023-07-15 MED ORDER — POTASSIUM CHLORIDE 20 MEQ PO PACK
40.0000 meq | PACK | Freq: Once | ORAL | Status: AC
  Administered 2023-07-15: 40 meq
  Filled 2023-07-15: qty 2

## 2023-07-15 NOTE — Care Management Important Message (Signed)
 Important Message  Patient Details  Name: Darlene [name] MRN: [id number] Date of Birth: 1940/12/16   Important Message Given:  Yes - Medicare IM     Felix Host 07/15/2023, 4:12 PM

## 2023-07-15 NOTE — Progress Notes (Signed)
   07/15/23 2020  BiPAP/CPAP/SIPAP  Reason BIPAP/CPAP not in use NG tube in place;Non-compliant (Pt has a coretrak in place and does not wear a CPAP at night.)  BiPAP/CPAP /SiPAP Vitals  Temp (!) 97.5 F (36.4 C)  Pulse Rate 91  Resp 18  BP 129/65  SpO2 99 %  MEWS Score/Color  MEWS Score 0  MEWS Score Color Green

## 2023-07-15 NOTE — Progress Notes (Signed)
 PROGRESS NOTE    Darlene [name]  VWU:[id number] DOB: 12/08/1940 DOA: 06/29/2023 PCP: Jenelle Mis, FNP   Brief Narrative:  Darlene [name] is a 83 y.o. F with PMH significant for atrial fibrillation on eliquis , OSA on CPAP, HTN, HLD, GERD and esophageal strictures.  She presents to our facility with ongoing coffee-ground emesis on 06/29/2023.  CT abdomen pelvis with contrast not revealing of any acute source of bleeding although noted high-grade stenosis of the celiac axis and collateral flow from the superior mesenteric artery-patient previously scheduled for hiatal hernia and spigelian hernia repair with Dr. Jamse Mcgee later this month.  Initially admitted for EGD planned for 06/30/2023, overnight noted to transition to A-fib with RVR with profound hypoxia during the event with noted pulmonary edema and large volume aspiration -initially placed on BiPAP with worsening respiratory status ultimately intubated for airway protection.  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  Ultimately extubated 07/04/23.  ICU delirium improving over the past week, respiratory status improving, echo showing moderate to severe right ventricular dysfunction.  Patient required transient pressors in the interim likely in setting of sepsis due to large volume aspiration pneumonia.  Patient weaned to nasal cannula, off pressors now transition to p.o. midodrine  and otherwise stable for transfer to hospital floor and out of the ICU.Aaron Aas   Hospital course 4/15 admit with coffee ground emesis for inpatient EGD 4/16 afib with RVR, concern for pulm edema, PCCM consult and ICU txfr 4/17: ETT air leak, fixed with more balloon inflation. On Precedex  and Fentanyl  drips for sedation. Upper endoscopy-->Cameron ulcers on hiatal hernia.  A-fib rate controlled on amiodarone . 4/21 Complaints of epigastric pain vs delirium overnight, still on amio gtt/heparin . Tube feeds paused this early this  morning.   4/22 Delirium improving, patient had BM, Pulmonary Edema vs BL infiltrates on CXR-, CVP 18 -diuresed with lasix  and metolazone  4/24 Diuresed 3.5 L with metolazone /Lasix  4/26 Placed on BiPAP for increased WOB 4/27 Point-of-care echo showed moderate to severe RV dysfunction.  LV function appears normal.  There is consolidation at both bases with a trivial effusion unsuitable for thoracentesis.  Started on norepinephrine  to support blood pressure as patient required Precedex  for delirium, which exacerbated hypotension 4/28 thoracentesis. Right 100 ml. Exudate by lights. Likely parapneumonic  4/29 off norepi PCXR improved. WBC nml pct neg stopped abx. Feeling better  4/30 weaned down to 4L [place] - transition to TRH 5/01 to present: Continue to wean patient off oxygen, PT OT following, possible plan for hernia repair in the next 72 hours pending OR availability.  Will transition off Eliquis  back to heparin  preoperatively.  Assessment & Plan:   Principal Problem:   Acute GI bleeding Active Problems:   Intractable nausea and vomiting   Acute respiratory failure with hypoxia (HCC)   Aspiration into airway   Hiatal hernia   Acute pulmonary edema (HCC)   Acute hypoxic and hypercapnic respiratory failure Aspiration pneumonia in setting of large hiatal hernia Cannot rule out septic shock Complicated by volume overload/parapneumonic pleural effusion -Oxygen weaning appropriately, approaching euvolemia - Completed antibiotic course for aspiration pneumonia coverage - Tube feeds ongoing via core track, remains n.p.o. in the setting of large hiatal hernia and increased risk of aspiration and vomiting - PT OT following, out of bed to chair as tolerated  Acute versus subacute GI bleed Hiatal hernia with oozing Cameron erosions - Hemoglobin stable, hold Eliquis , transition to heparin  as above perioperatively -GI following EGD 4/17 confirms  Cameron ulcers on hiatal hernia - General Surgery  following -plan for hiatal hernia repair next week pending or availability  Hypotension, improving Questionably iatrogenic versus septic shock given above -IV pressors, nor epi, off 4/ 29  -Continue midodrine  at 10mg  tid    A-fib with RVR, rate currently controlled On Eliquis  at baseline -Rate now controlled on p.o. amiodarone , off diltiazem  and beta-blocker given hypotension as above  - Eliquis  discontinued, currently on heparin  perioperatively   Severe deconditioning Ambulatory dysfunction PT OT following, discussed with family and staff to encourage ambulation, even short distances frequently throughout the day   Hyperglycemia without history of diabetes Continue sliding scale insulin , hypoglycemic protocol A1c previously within normal limits   DVT prophylaxis: Place and maintain sequential compression device Start: 07/01/23 0737 SCDs Start: 06/29/23 1509 apixaban  (ELIQUIS ) tablet 5 mg   Code Status:   Code Status: Full Code  Family Communication: At bedside  Status is: Inpatient  Dispo: The patient is from: Home              Anticipated d/c is to: To be determined              Anticipated d/c date is: 72+ hours              Patient currently not medically stable for discharge given ongoing need for surgical intervention, NG tube feeds and medical management  Consultants:  PCCM, GI, general surgery  Antimicrobials:  Previously completed  Subjective: No acute issues or events overnight denies nausea vomiting diarrhea constipation any fevers chills or chest pain.  Tolerating NG tube quite well, requesting p.o. diet which we discussed was extremely high risk given her recent history.  Objective: Vitals:   07/14/23 1700 07/14/23 1952 07/14/23 2115 07/15/23 0528  BP: (!) 115/100 (!) 110/48  (!) 126/48  Pulse:  (!) 110 (!) 103 (!) 114  Resp:  18 18   Temp: 98.2 F (36.8 C) 98 F (36.7 C)  98.1 F (36.7 C)  TempSrc: Oral Oral  Oral  SpO2:  96% 95% 96%  Weight:       Height:        Intake/Output Summary (Last 24 hours) at 07/15/2023 0745 Last data filed at 07/15/2023 0600 Gross per 24 hour  Intake 535 ml  Output 1770 ml  Net -1235 ml   Filed Weights   07/04/23 0544 07/05/23 0522 07/11/23 0500  Weight: 99.5 kg 98 kg 96.5 kg    Examination:  General:  Pleasantly resting in bed, No acute distress. HEENT:  Normocephalic atraumatic.  NG tube feeds ongoing, nasal cannula noted. Neck:  Without mass or deformity.  Trachea is midline. Lungs: Scant bibasilar rales without overt wheeze or rhonchi. Heart: Irregularly irregular. Abdomen:  Soft, nontender, nondistended. Extremities: Without cyanosis, clubbing, edema, or obvious deformity.  Data Reviewed: I have personally reviewed following labs and imaging studies  CBC: Recent Labs  Lab 07/10/23 1017 07/10/23 1148 07/11/23 0345 07/12/23 0318 07/13/23 0405 07/15/23 0315  WBC 18.6*  --  20.1* 15.2* 9.7 9.5  HGB 10.8* 10.5* 10.2* 10.4* 10.0* 10.3*  HCT 32.1* 31.0* 31.7* 32.4* 31.1* 32.5*  MCV 95.8  --  98.4 99.1 97.8 99.7  PLT 291  --  314 358 390 465*   Basic Metabolic Panel: Recent Labs  Lab 07/09/23 0520 07/10/23 1017 07/10/23 1148 07/11/23 0345 07/12/23 0318 07/13/23 0405 07/14/23 0327 07/15/23 0315  NA 139 139   < > 137 137 136 136 138  K 3.8 3.6   < >  3.0* 3.5 3.3* 3.5 3.7  CL 94* 92*  --  89* 93* 91* 94* 98  CO2 35* 35*  --  35* 33* 33* 31 30  GLUCOSE 162* 160*  --  205* 188* 199* 146* 114*  BUN 17 23  --  27* 28* 31* 28* 27*  CREATININE 0.60 0.67  --  0.86 0.84 0.82 0.69 0.69  CALCIUM 9.2 9.0  --  8.8* 9.2 9.0 9.0 9.0  MG 1.8 2.0  --   --   --  1.9 2.1 2.1  PHOS  --   --   --   --   --  3.8  --  3.0   < > = values in this interval not displayed.   GFR: Estimated Creatinine Clearance: 67 mL/min (by C-G formula based on SCr of 0.69 mg/dL).  Liver Function Tests: Recent Labs  Lab 07/12/23 1757  PROT 5.9*   CBG: Recent Labs  Lab 07/14/23 1119 07/14/23 1523  07/14/23 1954 07/15/23 0006 07/15/23 0416  GLUCAP 139* 141* 119* 158* 119*   Lipid Profile: Recent Labs    07/12/23 1757  CHOL 104   Sepsis Labs: Recent Labs  Lab 07/12/23 0318 07/13/23 0405 07/14/23 0327  PROCALCITON 0.14 0.12 <0.10    Recent Results (from the past 240 hours)  Body fluid culture w Gram Stain     Status: None (Preliminary result)   Collection Time: 07/12/23  4:50 PM   Specimen: Pleura; Body Fluid  Result Value Ref Range Status   Specimen Description PLEURAL  Final   Special Requests RIGHT LUNG  Final   Gram Stain   Final    RARE WBC PRESENT, PREDOMINANTLY MONONUCLEAR NO ORGANISMS SEEN    Culture   Final    NO GROWTH 2 DAYS Performed at [facility], [address], [place], [place] [address]    Report Status PENDING  Incomplete    Radiology Studies: No results found.  Scheduled Meds:  amiodarone   200 mg Per Tube Daily   apixaban   5 mg Per Tube BID   busPIRone   5 mg Per Tube TID   Chlorhexidine  Gluconate Cloth  6 each Topical Q0600   docusate  100 mg Per Tube BID   escitalopram   20 mg Per Tube Daily   furosemide   40 mg Intravenous BID   insulin  aspart  0-20 Units Subcutaneous Q4H   lidocaine   1 patch Transdermal Q24H   melatonin  3 mg Per Tube QHS   midodrine   10 mg Per Tube Q8H   mouth rinse  15 mL Mouth Rinse 4 times per day   pantoprazole  (PROTONIX ) IV  40 mg Intravenous Q12H   polyethylene glycol  17 g Per Tube Daily   sodium chloride  flush  10-40 mL Intracatheter Q12H   Continuous Infusions:  feeding supplement (VITAL 1.5 CAL) 1,000 mL (07/14/23 1823)     LOS: 15 days   Time spent:  Haydee Lipa, DO [facility]  If 7PM-7AM, please contact night-coverage [email or web address]  07/15/2023, 7:45 AM

## 2023-07-16 DIAGNOSIS — K922 Gastrointestinal hemorrhage, unspecified: Secondary | ICD-10-CM | POA: Diagnosis not present

## 2023-07-16 LAB — BODY FLUID CULTURE W GRAM STAIN: Culture: NO GROWTH

## 2023-07-16 LAB — CHOLESTEROL, BODY FLUID: Cholesterol, Fluid: 38 mg/dL

## 2023-07-16 LAB — GLUCOSE, CAPILLARY
Glucose-Capillary: 117 mg/dL — ABNORMAL HIGH (ref 70–99)
Glucose-Capillary: 126 mg/dL — ABNORMAL HIGH (ref 70–99)
Glucose-Capillary: 130 mg/dL — ABNORMAL HIGH (ref 70–99)
Glucose-Capillary: 137 mg/dL — ABNORMAL HIGH (ref 70–99)
Glucose-Capillary: 138 mg/dL — ABNORMAL HIGH (ref 70–99)
Glucose-Capillary: 165 mg/dL — ABNORMAL HIGH (ref 70–99)

## 2023-07-16 NOTE — TOC Initial Note (Signed)
 Transition of Care [facility]) - Initial/Assessment Note    Patient Details  Name: Darlene [name] MRN: [id number] Date of Birth: 1940/09/24  Transition of Care [facility]) CM/SW Contact:    Elspeth Hals, LCSW Phone Number: 07/16/2023, 10:57 AM  Clinical Narrative:       CSW met with pt and daughter Darlene [name] for initial assess.  Permission given to speak with Darlene [name].  Pt still awaiting surgical intervention, per daughter now planned for next Wed.   Pt from home with daughter and son in law, no current services.    Discussed current PT recommendation for SNF. Pt and daughter are open to SNF but also want to consider potential DC home with [facility]. Daughter and SIL are available to provide support at home and if that is possible would likely be their preference.  Medicare choice document provided for them to look at [facility] in [facility].  TOC will continue to follow.           Expected Discharge Plan:  (TBD) Barriers to Discharge: Continued Medical Work up   Patient Goals and CMS Choice Patient states their goals for this hospitalization and ongoing recovery are:: back to normal CMS Medicare.gov Compare Post Acute Care list provided to:: Patient Represenative (must comment) (daughter Darlene [name]) Choice offered to / list presented to : Adult Children      Expected Discharge Plan and Services In-house Referral: Clinical Social Work   Post Acute Care Choice:  (TBD) Living arrangements for the past 2 months: Single Family Home                                      Prior Living Arrangements/Services Living arrangements for the past 2 months: Single Family Home Lives with:: Adult Children (with daughter and son in Social worker) Patient language and need for interpreter reviewed:: Yes Do you feel safe going back to the place where you live?: Yes      Need for Family Participation in Patient Care: Yes (Comment) Care giver support system in place?: Yes (comment) Current home services: Other  (comment) (none) Criminal Activity/Legal Involvement Pertinent to Current Situation/Hospitalization: No - Comment as needed  Activities of Daily Living   ADL Screening (condition at time of admission) Independently performs ADLs?: Yes (appropriate for developmental age) Is the patient deaf or have difficulty hearing?: No Does the patient have difficulty seeing, even when wearing glasses/contacts?: No Does the patient have difficulty concentrating, remembering, or making decisions?: Yes  Permission Sought/Granted Permission sought to share information with : Family Supports Permission granted to share information with : Yes, Verbal Permission Granted  Share Information with NAME: daughter Darlene [name]           Emotional Assessment Appearance:: Appears stated age Attitude/Demeanor/Rapport: Engaged (just woke up-sleepy) Affect (typically observed): Appropriate Orientation: : Oriented to Self, Oriented to Place, Oriented to  Time, Oriented to Situation      Admission diagnosis:  Acute GI bleeding [K92.2] Intractable nausea and vomiting [R11.2] Acute hypoxic respiratory failure (HCC) [J96.01] Patient Active Problem List   Diagnosis Date Noted   Hiatal hernia 07/14/2023   Acute pulmonary edema (HCC) 07/14/2023   Aspiration into airway 07/06/2023   Acute respiratory failure with hypoxia (HCC) 06/30/2023   Acute GI bleeding 06/29/2023   Intractable nausea and vomiting 06/29/2023   Encounter to establish care with new provider 06/15/2023   Class 1 obesity with serious comorbidity and body mass  index (BMI) of 32.0 to 32.9 in adult 06/15/2023   Physical deconditioning 01/15/2020   At moderate risk for fall 01/15/2020   Pulmonary hypertension (HCC) 01/12/2020   Central sleep apnea associated with atrial fibrillation (HCC) 06/26/2019   Severe obstructive sleep apnea-hypopnea syndrome 05/31/2019   Hypoxemia requiring supplemental oxygen 05/09/2019   LVH (left ventricular hypertrophy) due  to hypertensive disease, without heart failure 05/09/2019   Atrial fibrillation with RVR (HCC) 04/15/2019   Esophageal stricture 09/04/2018   Iron deficiency anemia 09/04/2018   Continuous leakage of urine 02/17/2018   Vitamin D  deficiency 02/16/2018   Hyperlipidemia, mixed 10/01/2017   Abnormal glucose 09/30/2017   History of colon polyps 09/29/2017   GERD (gastroesophageal reflux disease) 04/01/2017   DJD (degenerative joint disease)    Chronic lower back pain    Depression    PCP:  Jenelle Mis, FNP Pharmacy:   [facility] [place] [address] [place] - [address] [place] [address] Phone: [phone] Fax: [phone]  [facility]) - [place], [place] - [address] [facility] [address] [place] [address] [place] [address] Phone: [phone] Fax: [phone]  [facility] - [place], [place] - [address] [place] [address] Phone: [phone] Fax: [phone]  [facility] - [place], [place] - [address] [facility] [place] [employer] [address] [place] [address] Phone: [phone] Fax: [phone]  [facility] [place], [place] - [address] [place] [address] Phone: [phone] Fax: [phone]  [facility] - [place], [place] - [address] [place] [address] Phone: [phone] Fax: [phone]  [facility] [place], [place] - [address] [place] [address] Phone: [phone] Fax: [phone]     Social Drivers of Health (SDOH) Social History: SDOH Screenings   Food Insecurity: No Food Insecurity (06/29/2023)  Housing: Low Risk  (06/29/2023)  Transportation Needs: No Transportation Needs (06/29/2023)  Utilities: Not At Risk (06/29/2023)  Alcohol Screen: Low Risk  (04/28/2019)  Depression (PHQ2-9): Medium Risk (06/15/2023)   Financial Resource Strain: Low Risk  (02/03/2021)  Physical Activity: Inactive (04/28/2019)  Social Connections: Moderately Isolated (06/29/2023)  Stress: No Stress Concern Present (04/28/2019)  Tobacco Use: Low Risk  (06/30/2023)   SDOH Interventions:     Readmission Risk Interventions     No data to display

## 2023-07-16 NOTE — Plan of Care (Signed)
  Problem: Education: Goal: Knowledge of General Education information will improve Description: Including pain rating scale, medication(s)/side effects and non-pharmacologic comfort measures Outcome: Progressing   Problem: Health Behavior/Discharge Planning: Goal: Ability to manage health-related needs will improve Outcome: Progressing   Problem: Clinical Measurements: Goal: Ability to maintain clinical measurements within normal limits will improve Outcome: Progressing Goal: Will remain free from infection Outcome: Progressing Goal: Diagnostic test results will improve Outcome: Progressing Goal: Respiratory complications will improve Outcome: Progressing Goal: Cardiovascular complication will be avoided Outcome: Progressing   Problem: Activity: Goal: Risk for activity intolerance will decrease Outcome: Progressing   Problem: Nutrition: Goal: Adequate nutrition will be maintained Outcome: Progressing   Problem: Coping: Goal: Level of anxiety will decrease Outcome: Progressing   Problem: Elimination: Goal: Will not experience complications related to bowel motility Outcome: Progressing Goal: Will not experience complications related to urinary retention Outcome: Progressing   Problem: Pain Managment: Goal: General experience of comfort will improve and/or be controlled Outcome: Progressing   Problem: Safety: Goal: Ability to remain free from injury will improve Outcome: Progressing   Problem: Skin Integrity: Goal: Risk for impaired skin integrity will decrease Outcome: Progressing   Problem: Education: Goal: Knowledge of disease or condition will improve Outcome: Progressing Goal: Understanding of medication regimen will improve Outcome: Progressing Goal: Individualized Educational Video(s) Outcome: Progressing   Problem: Activity: Goal: Ability to tolerate increased activity will improve Outcome: Progressing   Problem: Cardiac: Goal: Ability to achieve  and maintain adequate cardiopulmonary perfusion will improve Outcome: Progressing   Problem: Health Behavior/Discharge Planning: Goal: Ability to safely manage health-related needs after discharge will improve Outcome: Progressing   Problem: Activity: Goal: Ability to tolerate increased activity will improve Outcome: Progressing   Problem: Respiratory: Goal: Ability to maintain a clear airway and adequate ventilation will improve Outcome: Progressing   Problem: Role Relationship: Goal: Method of communication will improve Outcome: Progressing   Problem: Education: Goal: Ability to describe self-care measures that may prevent or decrease complications (Diabetes Survival Skills Education) will improve Outcome: Progressing Goal: Individualized Educational Video(s) Outcome: Progressing   Problem: Coping: Goal: Ability to adjust to condition or change in health will improve Outcome: Progressing   Problem: Fluid Volume: Goal: Ability to maintain a balanced intake and output will improve Outcome: Progressing   Problem: Health Behavior/Discharge Planning: Goal: Ability to identify and utilize available resources and services will improve Outcome: Progressing Goal: Ability to manage health-related needs will improve Outcome: Progressing   Problem: Metabolic: Goal: Ability to maintain appropriate glucose levels will improve Outcome: Progressing   Problem: Nutritional: Goal: Maintenance of adequate nutrition will improve Outcome: Progressing Goal: Progress toward achieving an optimal weight will improve Outcome: Progressing   Problem: Skin Integrity: Goal: Risk for impaired skin integrity will decrease Outcome: Progressing   Problem: Tissue Perfusion: Goal: Adequacy of tissue perfusion will improve Outcome: Progressing

## 2023-07-16 NOTE — Progress Notes (Signed)
 Physical Therapy Treatment Patient Details Name: Darlene [name] MRN: [id number] DOB: 03-18-1940 Today's Date: 07/16/2023   History of Present Illness 83 yo female admitted 06/29/23 with acute hypoxic hypercapnic respiratory failure in the setting of aspiration complicated by GI bleed. Intubated 4/16-4/20. 4/17 EGD. PMhx: depression, DJD, OSA, pulmonary HTN, obesity, HLD, Afib    PT Comments  Patient with limited progress due to symptomatic orthostatic hypotension with BP 71/35 on EOB and symptoms worse despite seated LE therex.  She was able to use the rail to help get up to EOB with CGA and increased time, though needing +2 to supine due to symptoms.  She reports had gotten up to [facility] earlier with nursing and NT reported BP stable.  PT will continue to follow and progress as able.  Encouraged to elevate HOB throughout the day for improved upright tolerance.     If plan is discharge home, recommend the following: A lot of help with bathing/dressing/bathroom;Assistance with cooking/housework;Direct supervision/assist for financial management;Assist for transportation;Two people to help with walking and/or transfers;Direct supervision/assist for medications management   Can travel by private vehicle     No  Equipment Recommendations  Hospital bed;Wheelchair (measurements PT);Hoyer lift    Recommendations for Other Services       Precautions / Restrictions Precautions Precautions: Fall Recall of Precautions/Restrictions: Impaired Precaution/Restrictions Comments: watch SPO2, cortrak, incontinent     Mobility  Bed Mobility Overal bed mobility: Needs Assistance Bed Mobility: Rolling, Sidelying to Sit Rolling: Contact guard assist, Used rails Sidelying to sit: Contact guard assist, Used rails   Sit to supine: Mod assist, +2 for physical assistance   General bed mobility comments: up to EOB with cues, increased time and guiding using UE's to transition; to supine A for legs and trunk due  to low BP    Transfers                   General transfer comment: NT due to BP very low and pt symptomatic on EOB    Ambulation/Gait                   Stairs             Wheelchair Mobility     Tilt Bed    Modified Rankin (Stroke Patients Only)       Balance Overall balance assessment: Needs assistance   Sitting balance-Leahy Scale: Fair Sitting balance - Comments: S initially at EOB though progressed to CGA for balance as symptomatic with low BP on EOB                                    Communication Communication Communication: No apparent difficulties  Cognition Arousal: Alert Behavior During Therapy: WFL for tasks assessed/performed   PT - Cognitive impairments: No apparent impairments                       PT - Cognition Comments: not formally tested Following commands: Intact Following commands impaired: Only follows one step commands consistently    Cueing    Exercises General Exercises - Lower Extremity Ankle Circles/Pumps: AROM, Both, 5 reps, Supine Long Arc Quad: AROM, Both, Seated (3 reps) Heel Slides: AROM, Both, Supine (3 reps) Hip Flexion/Marching: AROM, Both, Seated (3 reps)    General Comments General comments (skin integrity, edema, etc.): pt symptomatic on EOB and BP check noted for 71/35  and pt not feeling better despite LE therex so returned to supine and BP 107/59.  RN aware and sent secure chat to MD      Pertinent Vitals/Pain Pain Assessment Pain Assessment: Faces Faces Pain Scale: No hurt    Home Living                          Prior Function            PT Goals (current goals can now be found in the care plan section) Progress towards PT goals: Progressing toward goals    Frequency    Min 2X/week      PT Plan      Co-evaluation              AM-PAC PT "6 Clicks" Mobility   Outcome Measure  Help needed turning from your back to your side while in a  flat bed without using bedrails?: A Lot Help needed moving from lying on your back to sitting on the side of a flat bed without using bedrails?: A Lot Help needed moving to and from a bed to a chair (including a wheelchair)?: Total Help needed standing up from a chair using your arms (e.g., wheelchair or bedside chair)?: Total Help needed to walk in hospital room?: Total Help needed climbing 3-5 steps with a railing? : Total 6 Click Score: 8    End of Session Equipment Utilized During Treatment: Oxygen Activity Tolerance: Treatment limited secondary to medical complications (Comment) (orthostatic hypotension) Patient left: in bed;with call bell/phone within reach;with nursing/sitter in room   PT Visit Diagnosis: Unsteadiness on feet (R26.81);Other abnormalities of gait and mobility (R26.89);Muscle weakness (generalized) (M62.81)     Time: 1610-9604 PT Time Calculation (min) (ACUTE ONLY): 18 min  Charges:    $Therapeutic Activity: 8-22 mins PT General Charges $$ ACUTE PT VISIT: 1 Visit                     Abigail Hoff, PT Acute Rehabilitation Services [phone] 07/16/2023    Marley Simmers 07/16/2023, 1:22 PM

## 2023-07-16 NOTE — Progress Notes (Signed)
 15 Days Post-Op   Subjective/Chief Complaint: Has been transferred to the floor.  Tried to work with physical therapy today but had some hypotension when she got up.   Objective: Vital signs in last 24 hours: Temp:  [97.4 F (36.3 C)-98 F (36.7 C)] 97.8 F (36.6 C) (05/02 1200) Pulse Rate:  [81-96] 81 (05/02 1200) Resp:  [18] 18 (05/02 1200) BP: (99-129)/(52-68) 105/62 (05/02 1200) SpO2:  [96 %-100 %] 98 % (05/02 1200) Last BM Date : 07/15/23  Intake/Output from previous day: 05/01 0701 - 05/02 0700 In: 1068 [NG/GT:1068] Out: 2100 [Urine:1800; Emesis/NG output:300] Intake/Output this shift: Total I/O In: 970.1 [I.V.:217.8; NG/GT:752.3] Out: -   Alert, appears debilitated but in no distress Unlabored respirations on nasal cannula Abdomen is benign  Lab Results:  Recent Labs    07/15/23 0315  WBC 9.5  HGB 10.3*  HCT 32.5*  PLT 465*   BMET Recent Labs    07/14/23 0327 07/15/23 0315  NA 136 138  K 3.5 3.7  CL 94* 98  CO2 31 30  GLUCOSE 146* 114*  BUN 28* 27*  CREATININE 0.69 0.69  CALCIUM 9.0 9.0   PT/INR No results for input(s): "LABPROT", "INR" in the last 72 hours. ABG No results for input(s): "PHART", "HCO3" in the last 72 hours.  Invalid input(s): "PCO2", "PO2"  Studies/Results: No results found.   Anti-infectives: Anti-infectives (From admission, onward)    Start     Dose/Rate Route Frequency Ordered Stop   07/09/23 0945  piperacillin -tazobactam (ZOSYN ) IVPB 3.375 g  Status:  Discontinued        3.375 g 12.5 mL/hr over 240 Minutes Intravenous Every 8 hours 07/09/23 0845 07/13/23 0842   07/05/23 0915  cefTRIAXone  (ROCEPHIN ) 2 g in sodium chloride  0.9 % 100 mL IVPB  Status:  Discontinued        2 g 200 mL/hr over 30 Minutes Intravenous Every 24 hours 07/05/23 0815 07/09/23 0845   06/30/23 2100  piperacillin -tazobactam (ZOSYN ) IVPB 3.375 g       Placed in "Followed by" Linked Group   3.375 g 12.5 mL/hr over 240 Minutes Intravenous Every 8  hours 06/30/23 1356 07/03/23 0016   06/30/23 1445  piperacillin -tazobactam (ZOSYN ) IVPB 3.375 g       Placed in "Followed by" Linked Group   3.375 g 100 mL/hr over 30 Minutes Intravenous  Once 06/30/23 1356 06/30/23 1626       Assessment/Plan:  Hiatal hernia with oozing Cameron erosions leaking to UGIB Spigelian hernia containing ascending colon  - will plan operative repair of at least [facility] this admission -currently scheduled for 5/7 at 08:30.  I went over once again with the patient and her daughter at bedside the procedure planned which would be robotic hiatal hernia repair and gastrostomy tube.  We discussed that she is at higher risk of complications including cardiovascular, pulmonary, and thromboembolic complications as well as prolonged hospitalization and recovery given that she has been here now for over 2 weeks and has become progressively debilitated.  However, I do not think there is a preferable alternative in terms of her ever leaving hospital without repairing the hernia to avoid another episode of aspiration. - heparin  gtt - currently tolerating TF, continue this or PO when able - Surgery team will check again on Monday, please call us  over the weekend if there are any issues or concerns.   FEN: TF via Cortrak, NPO VTE:  heparin  gtt ID: [email or web address]  4/25>> Foley: present    -  per CCM -  Acute hypoxic and hypercapnic respiratory failure secondary to aspiration PNA  ICU delirium Drug related hypotension A. Fib on Eliquis  with RVR - A. Fib more controlled now, hold eliquis  Hyperglycemia  Severe deconditioning   LOS: 12 days    I reviewed last 24 h vitals and pain scores, last 48 h intake and output, last 24 h labs and trends, last 24 h imaging results, and PCCM notes .   This care required moderate level of medical decision making.    LOS: 16 days    Adalberto Acton 07/16/2023

## 2023-07-16 NOTE — Plan of Care (Signed)
 Alert and oriented.  Family at bedside.  Tolerating tube feedings.  Orthostatic when getting OOB with PT.   Problem: Education: Goal: Knowledge of General Education information will improve Description: Including pain rating scale, medication(s)/side effects and non-pharmacologic comfort measures Outcome: Progressing   Problem: Health Behavior/Discharge Planning: Goal: Ability to manage health-related needs will improve Outcome: Progressing   Problem: Clinical Measurements: Goal: Ability to maintain clinical measurements within normal limits will improve Outcome: Progressing Goal: Will remain free from infection Outcome: Progressing Goal: Diagnostic test results will improve Outcome: Progressing

## 2023-07-16 NOTE — Progress Notes (Signed)
 PROGRESS NOTE    Darlene [name]  ZOX:[id number] DOB: Jun 01, 1940 DOA: 06/29/2023 PCP: Darlene Mis, FNP   Brief Narrative:  Darlene [name] is a 83 y.o. F with PMH significant for atrial fibrillation on eliquis , OSA on CPAP, HTN, HLD, GERD and esophageal strictures.  She presents to our facility with ongoing coffee-ground emesis on 06/29/2023.  CT abdomen pelvis with contrast not revealing of any acute source of bleeding although noted high-grade stenosis of the celiac axis and collateral flow from the superior mesenteric artery-patient previously scheduled for hiatal hernia and spigelian hernia repair with Dr. Jamse Mcgee later this month.  Initially admitted for EGD planned for 06/30/2023, overnight noted to transition to A-fib with RVR with profound hypoxia during the event with noted pulmonary edema and large volume aspiration -initially placed on BiPAP with worsening respiratory status ultimately intubated for airway protection.  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  Ultimately extubated 07/04/23.  ICU delirium improving over the past week, respiratory status improving, echo showing moderate to severe right ventricular dysfunction.  Patient required transient pressors in the interim likely in setting of sepsis due to large volume aspiration pneumonia.  Patient weaned to nasal cannula, off pressors now transition to p.o. midodrine  and otherwise stable for transfer to hospital floor and out of the ICU.Darlene [name]   Hospital course 4/15 admit with coffee ground emesis for inpatient EGD 4/16 afib with RVR, concern for pulm edema, PCCM consult and ICU txfr 4/17: ETT air leak, fixed with more balloon inflation. On Precedex  and Fentanyl  drips for sedation. Upper endoscopy-->Cameron ulcers on hiatal hernia.  A-fib rate controlled on amiodarone . 4/21 Complaints of epigastric pain vs delirium overnight, still on amio gtt/heparin . Tube feeds paused this early this  morning.   4/22 Delirium improving, patient had BM, Pulmonary Edema vs BL infiltrates on CXR-, CVP 18 -diuresed with lasix  and metolazone  4/24 Diuresed 3.5 L with metolazone /Lasix  4/26 Placed on BiPAP for increased WOB 4/27 Point-of-care echo showed moderate to severe RV dysfunction.  LV function appears normal.  There is consolidation at both bases with a trivial effusion unsuitable for thoracentesis.  Started on norepinephrine  to support blood pressure as patient required Precedex  for delirium, which exacerbated hypotension 4/28 thoracentesis. Right 100 ml. Exudate by lights. Likely parapneumonic  4/29 off norepi PCXR improved. WBC nml pct neg stopped abx. Feeling better  4/30 weaned down to 4L [place] - transition to TRH 5/01 to present: Continue to wean patient off oxygen, PT OT following, possible plan for hernia repair in the next 72 hours pending OR availability.  Will transition off Eliquis  back to heparin  preoperatively.  Assessment & Plan:   Principal Problem:   Acute GI bleeding Active Problems:   Intractable nausea and vomiting   Acute respiratory failure with hypoxia (HCC)   Aspiration into airway   Hiatal hernia   Acute pulmonary edema (HCC)   Acute hypoxic and hypercapnic respiratory failure Aspiration pneumonia in setting of large hiatal hernia Cannot rule out septic shock Complicated by volume overload/parapneumonic pleural effusion - Oxygen weaning appropriately, approaching euvolemia - Completed antibiotic course for aspiration pneumonia coverage - Tube feeds ongoing via core track, remains n.p.o. in the setting of large hiatal hernia and increased risk of aspiration and vomiting - PT OT following, out of bed to chair as tolerated  Acute versus subacute GI bleed Hiatal hernia with oozing Cameron erosions - Hemoglobin stable, hold Eliquis , transition to heparin  as above perioperatively -GI following EGD 4/17  confirms Cameron ulcers on hiatal hernia - General Surgery  following -plan for hiatal hernia repair next week pending OR availability  Hypotension, improving Questionably iatrogenic versus septic shock given above Orthostatic hypotension ongoing -IV pressors, nor epi, off 4/ 29  -Continue midodrine  at 10mg  tid  -Profoundly hypotensive 5/2 with orthostatics, blood pressure 70s/40s   A-fib with RVR, rate currently controlled On Eliquis  at baseline -Rate now controlled on p.o. amiodarone , off diltiazem  and beta-blocker given hypotension as above  - Eliquis  discontinued, currently on heparin  perioperatively   Severe deconditioning Ambulatory dysfunction PT OT following, discussed with family and staff to encourage ambulation, even short distances frequently throughout the day   Hyperglycemia without history of diabetes Continue sliding scale insulin , hypoglycemic protocol A1c previously within normal limits 5.6  DVT prophylaxis: Place and maintain sequential compression device Start: 07/01/23 0737 SCDs Start: 06/29/23 1509 Code Status:   Code Status: Full Code Family Communication: At bedside  Status is: Inpatient Dispo: The patient is from: Home              Anticipated d/c is to: To be determined              Anticipated d/c date is: 72+ hours              Patient currently not medically stable for discharge given ongoing need for surgical intervention, NG tube feeds and medical management  Consultants:  PCCM, GI, general surgery  Antimicrobials:  Previously completed  Subjective: No acute issues or events overnight denies nausea vomiting diarrhea constipation any fevers chills or chest pain.  Tolerating NG tube quite well, requesting p.o. diet which we discussed was extremely high risk given her recent history.  Objective: Vitals:   07/15/23 1647 07/15/23 2020 07/16/23 0011 07/16/23 0413  BP: (!) 120/58 129/65 (!) 127/52 (!) 122/52  Pulse: 96 91 87 93  Resp: 18 18 18 18   Temp: 97.6 F (36.4 C) (!) 97.5 F (36.4 C) 98 F (36.7  C) (!) 97.4 F (36.3 C)  TempSrc: Oral  Oral   SpO2: 96% 99% 97% 98%  Weight:      Height:        Intake/Output Summary (Last 24 hours) at 07/16/2023 0738 Last data filed at 07/16/2023 0500 Gross per 24 hour  Intake 1068 ml  Output 2100 ml  Net -1032 ml   Filed Weights   07/04/23 0544 07/05/23 0522 07/11/23 0500  Weight: 99.5 kg 98 kg 96.5 kg    Examination:  General:  Pleasantly resting in bed, No acute distress. HEENT:  Normocephalic atraumatic.  NG tube feeds ongoing, nasal cannula noted. Neck:  Without mass or deformity.  Trachea is midline. Lungs: Scant bibasilar rales without overt wheeze or rhonchi. Heart: Irregularly irregular. Abdomen:  Soft, nontender, nondistended. Extremities: Without cyanosis, clubbing, edema, or obvious deformity.  Data Reviewed: I have personally reviewed following labs and imaging studies  CBC: Recent Labs  Lab 07/10/23 1017 07/10/23 1148 07/11/23 0345 07/12/23 0318 07/13/23 0405 07/15/23 0315  WBC 18.6*  --  20.1* 15.2* 9.7 9.5  HGB 10.8* 10.5* 10.2* 10.4* 10.0* 10.3*  HCT 32.1* 31.0* 31.7* 32.4* 31.1* 32.5*  MCV 95.8  --  98.4 99.1 97.8 99.7  PLT 291  --  314 358 390 465*   Basic Metabolic Panel: Recent Labs  Lab 07/10/23 1017 07/10/23 1148 07/11/23 0345 07/12/23 0318 07/13/23 0405 07/14/23 0327 07/15/23 0315  NA 139   < > 137 137 136 136 138  K 3.6   < >  3.0* 3.5 3.3* 3.5 3.7  CL 92*  --  89* 93* 91* 94* 98  CO2 35*  --  35* 33* 33* 31 30  GLUCOSE 160*  --  205* 188* 199* 146* 114*  BUN 23  --  27* 28* 31* 28* 27*  CREATININE 0.67  --  0.86 0.84 0.82 0.69 0.69  CALCIUM 9.0  --  8.8* 9.2 9.0 9.0 9.0  MG 2.0  --   --   --  1.9 2.1 2.1  PHOS  --   --   --   --  3.8  --  3.0   < > = values in this interval not displayed.   GFR: Estimated Creatinine Clearance: 67 mL/min (by C-G formula based on SCr of 0.69 mg/dL).  Liver Function Tests: Recent Labs  Lab 07/12/23 1757  PROT 5.9*   CBG: Recent Labs  Lab  07/15/23 1151 07/15/23 1644 07/15/23 2046 07/16/23 0009 07/16/23 0415  GLUCAP 129* 124* 125* 137* 138*   Lipid Profile: No results for input(s): "CHOL", "HDL", "LDLCALC", "TRIG", "CHOLHDL", "LDLDIRECT" in the last 72 hours.  Sepsis Labs: Recent Labs  Lab 07/12/23 0318 07/13/23 0405 07/14/23 0327  PROCALCITON 0.14 0.12 <0.10    Recent Results (from the past 240 hours)  Body fluid culture w Gram Stain     Status: None (Preliminary result)   Collection Time: 07/12/23  4:50 PM   Specimen: Pleura; Body Fluid  Result Value Ref Range Status   Specimen Description PLEURAL  Final   Special Requests RIGHT LUNG  Final   Gram Stain   Final    RARE WBC PRESENT, PREDOMINANTLY MONONUCLEAR NO ORGANISMS SEEN    Culture   Final    NO GROWTH 3 DAYS Performed at [facility], [address] [place], [place] [address]    Report Status PENDING  Incomplete    Radiology Studies: No results found.  Scheduled Meds:  amiodarone   200 mg Per Tube Daily   busPIRone   5 mg Per Tube TID   Chlorhexidine  Gluconate Cloth  6 each Topical Q0600   docusate  100 mg Per Tube BID   escitalopram   20 mg Per Tube Daily   furosemide   40 mg Intravenous BID   insulin  aspart  0-20 Units Subcutaneous Q4H   lidocaine   1 patch Transdermal Q24H   melatonin  3 mg Per Tube QHS   midodrine   10 mg Per Tube Q8H   mouth rinse  15 mL Mouth Rinse 4 times per day   pantoprazole  (PROTONIX ) IV  40 mg Intravenous Q12H   polyethylene glycol  17 g Per Tube Daily   sodium chloride  flush  10-40 mL Intracatheter Q12H   Continuous Infusions:  feeding supplement (VITAL 1.5 CAL) 45 mL/hr at 07/15/23 1807     LOS: 16 days   Time spent:  Haydee Lipa, DO [facility]  If 7PM-7AM, please contact night-coverage [email or web address]  07/16/2023, 7:38 AM

## 2023-07-17 DIAGNOSIS — K922 Gastrointestinal hemorrhage, unspecified: Secondary | ICD-10-CM | POA: Diagnosis not present

## 2023-07-17 LAB — GLUCOSE, CAPILLARY
Glucose-Capillary: 120 mg/dL — ABNORMAL HIGH (ref 70–99)
Glucose-Capillary: 124 mg/dL — ABNORMAL HIGH (ref 70–99)
Glucose-Capillary: 126 mg/dL — ABNORMAL HIGH (ref 70–99)
Glucose-Capillary: 128 mg/dL — ABNORMAL HIGH (ref 70–99)
Glucose-Capillary: 147 mg/dL — ABNORMAL HIGH (ref 70–99)
Glucose-Capillary: 158 mg/dL — ABNORMAL HIGH (ref 70–99)

## 2023-07-17 LAB — APTT: aPTT: 50 s — ABNORMAL HIGH (ref 24–36)

## 2023-07-17 MED ORDER — HEPARIN (PORCINE) 25000 UT/250ML-% IV SOLN
1500.0000 [IU]/h | INTRAVENOUS | Status: DC
  Administered 2023-07-17: 1400 [IU]/h via INTRAVENOUS
  Administered 2023-07-18 (×2): 1600 [IU]/h via INTRAVENOUS
  Administered 2023-07-19: 1450 [IU]/h via INTRAVENOUS
  Administered 2023-07-20: 1500 [IU]/h via INTRAVENOUS
  Filled 2023-07-17 (×5): qty 250

## 2023-07-17 NOTE — Progress Notes (Signed)
 PROGRESS NOTE    Darlene [name]  BJY:[id number] DOB: [birth year] DOA: 06/29/2023 PCP: Jenelle Mis, FNP   Brief Narrative:  Darlene [name] is a 83 y.o. F with PMH significant for atrial fibrillation on eliquis , OSA on CPAP, HTN, HLD, GERD and esophageal strictures.  She presents to our facility with ongoing coffee-ground emesis on 06/29/2023.  CT abdomen pelvis with contrast not revealing of any acute source of bleeding although noted high-grade stenosis of the celiac axis and collateral flow from the superior mesenteric artery-patient previously scheduled for hiatal hernia and spigelian hernia repair with Dr. Jamse Mcgee later this month.  Initially admitted for EGD planned for 06/30/2023, overnight noted to transition to A-fib with RVR with profound hypoxia during the event with noted pulmonary edema and large volume aspiration -initially placed on BiPAP with worsening respiratory status ultimately intubated for airway protection.  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  Ultimately extubated 07/04/23.  ICU delirium improving over the past week, respiratory status improving, echo showing moderate to severe right ventricular dysfunction.  Patient required transient pressors in the interim likely in setting of sepsis due to large volume aspiration pneumonia.  Patient weaned to nasal cannula, off pressors now transition to p.o. midodrine  and otherwise stable for transfer to hospital floor and out of the ICU.Aaron Aas   Hospital course 4/15 admit with coffee ground emesis for inpatient EGD 4/16 afib with RVR, concern for pulm edema, PCCM consult and ICU txfr 4/17: ETT air leak, fixed with more balloon inflation. On Precedex  and Fentanyl  drips for sedation. Upper endoscopy-->Cameron ulcers on hiatal hernia.  A-fib rate controlled on amiodarone . 4/21 Complaints of epigastric pain vs delirium overnight, still on amio gtt/heparin . Tube feeds paused this early this  morning.   4/22 Delirium improving, patient had BM, Pulmonary Edema vs BL infiltrates on CXR-, CVP 18 -diuresed with lasix  and metolazone  4/24 Diuresed 3.5 L with metolazone /Lasix  4/26 Placed on BiPAP for increased WOB 4/27 Point-of-care echo showed moderate to severe RV dysfunction.  LV function appears normal.  There is consolidation at both bases with a trivial effusion unsuitable for thoracentesis.  Started on norepinephrine  to support blood pressure as patient required Precedex  for delirium, which exacerbated hypotension 4/28 thoracentesis. Right 100 ml. Exudate by lights. Likely parapneumonic  4/29 off norepi PCXR improved. WBC nml pct neg stopped abx. Feeling better  4/30 weaned down to 4L [place] - transition to TRH 5/01 to present: Continue to wean patient off oxygen, PT OT following, possible plan for hernia repair in the next 72 hours pending OR availability.  Will transition off Eliquis  back to heparin  preoperatively.  Assessment & Plan:   Principal Problem:   Acute GI bleeding Active Problems:   Intractable nausea and vomiting   Acute respiratory failure with hypoxia (HCC)   Aspiration into airway   Hiatal hernia   Acute pulmonary edema (HCC)   Acute hypoxic and hypercapnic respiratory failure Aspiration pneumonia in setting of large hiatal hernia Cannot rule out septic shock Complicated by volume overload/parapneumonic pleural effusion - Oxygen weaning appropriately, approaching euvolemia - Completed antibiotic course for aspiration pneumonia coverage - Tube feeds ongoing via core track, remains n.p.o. in the setting of large hiatal hernia and increased risk of aspiration and vomiting - PT OT following, out of bed to chair as tolerated  Acute versus subacute GI bleed Hiatal hernia with oozing Cameron erosions - Hemoglobin stable, hold Eliquis , transition to heparin  as above perioperatively -GI following EGD 4/17  confirms Cameron ulcers on hiatal hernia - General Surgery  following -plan for hiatal hernia repair next week pending OR availability  Hypotension, improving Questionably iatrogenic versus septic shock given above Orthostatic hypotension ongoing -IV pressors, nor epi, off 4/ 29  -Continue midodrine  at 10mg  tid  -Profoundly hypotensive 5/2 with orthostatics, blood pressure 70s/40s - improving somewhat today   A-fib with RVR, rate currently controlled On Eliquis  at baseline -Rate now controlled on p.o. amiodarone , off diltiazem  and beta-blocker given hypotension as above  - Eliquis  discontinued, currently on heparin  perioperatively   Severe deconditioning Ambulatory dysfunction PT OT following, discussed with family and staff to encourage ambulation, even short distances frequently throughout the day   Hyperglycemia without history of diabetes Continue sliding scale insulin , hypoglycemic protocol A1c previously within normal limits 5.6  DVT prophylaxis: Place and maintain sequential compression device Start: 07/01/23 0737 SCDs Start: 06/29/23 1509 Code Status:   Code Status: Full Code Family Communication: At bedside  Status is: Inpatient Dispo: The patient is from: Home              Anticipated d/c is to: To be determined              Anticipated d/c date is: 72+ hours              Patient currently not medically stable for discharge given ongoing need for surgical intervention, NG tube feeds and medical management  Consultants:  PCCM, GI, general surgery  Antimicrobials:  Previously completed  Subjective: No acute issues or events overnight, patient still has symptomatic hypotension with orthostatics, unable to tolerate PT yesterday due to profound symptoms. Objective: Vitals:   07/16/23 1200 07/16/23 1700 07/16/23 2005 07/17/23 0441  BP: 105/62 (!) 116/51 (!) 119/48 (!) 128/55  Pulse: 81 99 (!) 107 68  Resp: 18 18 17 17   Temp: 97.8 F (36.6 C) 97.9 F (36.6 C) 98.1 F (36.7 C) (!) 97.5 F (36.4 C)  TempSrc: Oral Oral Oral  Oral  SpO2: 98% 96% 98% 98%  Weight:      Height:        Intake/Output Summary (Last 24 hours) at 07/17/2023 0803 Last data filed at 07/16/2023 2130 Gross per 24 hour  Intake 1030.09 ml  Output 1300 ml  Net -269.91 ml   Filed Weights   07/04/23 0544 07/05/23 0522 07/11/23 0500  Weight: 99.5 kg 98 kg 96.5 kg    Examination:  General:  Pleasantly resting in bed, No acute distress. HEENT:  Normocephalic atraumatic.  NG tube feeds ongoing, nasal cannula noted. Neck:  Without mass or deformity.  Trachea is midline. Lungs: Scant bibasilar rales without overt wheeze or rhonchi. Heart: Irregularly irregular. Abdomen:  Soft, nontender, nondistended. Extremities: Without cyanosis, clubbing, edema, or obvious deformity.  Data Reviewed: I have personally reviewed following labs and imaging studies  CBC: Recent Labs  Lab 07/10/23 1017 07/10/23 1148 07/11/23 0345 07/12/23 0318 07/13/23 0405 07/15/23 0315  WBC 18.6*  --  20.1* 15.2* 9.7 9.5  HGB 10.8* 10.5* 10.2* 10.4* 10.0* 10.3*  HCT 32.1* 31.0* 31.7* 32.4* 31.1* 32.5*  MCV 95.8  --  98.4 99.1 97.8 99.7  PLT 291  --  314 358 390 465*   Basic Metabolic Panel: Recent Labs  Lab 07/10/23 1017 07/10/23 1148 07/11/23 0345 07/12/23 0318 07/13/23 0405 07/14/23 0327 07/15/23 0315  NA 139   < > 137 137 136 136 138  K 3.6   < > 3.0* 3.5 3.3* 3.5 3.7  CL 92*  --  89* 93* 91* 94* 98  CO2 35*  --  35* 33* 33* 31 30  GLUCOSE 160*  --  205* 188* 199* 146* 114*  BUN 23  --  27* 28* 31* 28* 27*  CREATININE 0.67  --  0.86 0.84 0.82 0.69 0.69  CALCIUM 9.0  --  8.8* 9.2 9.0 9.0 9.0  MG 2.0  --   --   --  1.9 2.1 2.1  PHOS  --   --   --   --  3.8  --  3.0   < > = values in this interval not displayed.   GFR: Estimated Creatinine Clearance: 67 mL/min (by C-G formula based on SCr of 0.69 mg/dL).  Liver Function Tests: Recent Labs  Lab 07/12/23 1757  PROT 5.9*   CBG: Recent Labs  Lab 07/16/23 1200 07/16/23 1558 07/16/23 2009  07/17/23 0011 07/17/23 0437  GLUCAP 165* 130* 126* 126* 124*   Lipid Profile: No results for input(s): "CHOL", "HDL", "LDLCALC", "TRIG", "CHOLHDL", "LDLDIRECT" in the last 72 hours.  Sepsis Labs: Recent Labs  Lab 07/12/23 0318 07/13/23 0405 07/14/23 0327  PROCALCITON 0.14 0.12 <0.10    Recent Results (from the past 240 hours)  Body fluid culture w Gram Stain     Status: None   Collection Time: 07/12/23  4:50 PM   Specimen: Pleura; Body Fluid  Result Value Ref Range Status   Specimen Description PLEURAL  Final   Special Requests RIGHT LUNG  Final   Gram Stain   Final    RARE WBC PRESENT, PREDOMINANTLY MONONUCLEAR NO ORGANISMS SEEN    Culture   Final    NO GROWTH 3 DAYS Performed at [facility], [address] [place], [place] [address]    Report Status 07/16/2023 FINAL  Final    Radiology Studies: No results found.  Scheduled Meds:  amiodarone   200 mg Per Tube Daily   busPIRone   5 mg Per Tube TID   Chlorhexidine  Gluconate Cloth  6 each Topical Q0600   docusate  100 mg Per Tube BID   escitalopram   20 mg Per Tube Daily   furosemide   40 mg Intravenous BID   insulin  aspart  0-20 Units Subcutaneous Q4H   lidocaine   1 patch Transdermal Q24H   melatonin  3 mg Per Tube QHS   midodrine   10 mg Per Tube Q8H   mouth rinse  15 mL Mouth Rinse 4 times per day   pantoprazole  (PROTONIX ) IV  40 mg Intravenous Q12H   polyethylene glycol  17 g Per Tube Daily   sodium chloride  flush  10-40 mL Intracatheter Q12H   Continuous Infusions:  feeding supplement (VITAL 1.5 CAL) 1,000 mL (07/16/23 1913)     LOS: 17 days   Time spent:  Haydee Lipa, DO [facility]  If 7PM-7AM, please contact night-coverage [email or web address]  07/17/2023, 8:03 AM

## 2023-07-17 NOTE — Progress Notes (Signed)
 PHARMACY - ANTICOAGULATION CONSULT NOTE  Pharmacy Consult for heparin  (apixaban  on hold) Indication: atrial fibrillation  Allergies  Allergen Reactions   Cephalosporins Rash    Tolerated ceftriaxone  06/2023   Keflex [Cephalexin] Rash    Patient Measurements: Height: 5\' 10"  (177.8 cm) Weight: 96.5 kg (212 lb 11.9 oz) IBW/kg (Calculated) : 68.5 HEPARIN  DW (KG): 91  Vital Signs: Temp: 98.5 F (36.9 C) (05/03 0817) Temp Source: Oral (05/03 0817) BP: 116/62 (05/03 0817) Pulse Rate: 103 (05/03 0817)  Labs: Recent Labs    07/15/23 0315  HGB 10.3*  HCT 32.5*  PLT 465*  CREATININE 0.69    Estimated Creatinine Clearance: 67 mL/min (by C-G formula based on SCr of 0.69 mg/dL).   Medical History: Past Medical History:  Diagnosis Date   Anxiety    Chronic lower back pain    Depression    DJD (degenerative joint disease)    knees, neck   GERD (gastroesophageal reflux disease)    has had dilitation in the past   Hypertension     Assessment: 83 YO female with PMH significant for atrial fibrillation on Eliquis  admitted for hiatal hernia with oozing erosions leaking to UGIB. Plan operative repair currently scheduled for 5/07. Apixaban  now held, last dose 5/01 @ 2200. Pharmacy consulted to initiate IV heparin  in interim.  CBC stable, hgb 10, platelets 465. No issues with bleeding noted while on apixaban .  Goal of Therapy:  Heparin  level 0.3-0.7 units/ml aPTT 66-102 seconds Monitor platelets by anticoagulation protocol: Yes   Plan:  Start heparin  infusion at 1400 units/hr Check heparin  level in 8 hours and daily while on heparin  Continue to monitor H&H and platelets  Thank you for allowing pharmacy to be a part of this patient's care.  Claudia Cuff, PharmD, BCPS Clinical Pharmacist

## 2023-07-17 NOTE — Progress Notes (Signed)
 PHARMACY - ANTICOAGULATION CONSULT NOTE  Pharmacy Consult for heparin  Indication: atrial fibrillation  Labs: Recent Labs    07/15/23 0315 07/17/23 2240  HGB 10.3*  --   HCT 32.5*  --   PLT 465*  --   APTT  --  50*  CREATININE 0.69  --    Assessment: 82yo female subtherapeutic on heparin  with initial dosing while apixaban  on hold; no infusion issues or signs of bleeding per RN.  Goal of Therapy:  aPTT 66-102 seconds   Plan:  Increase heparin  infusion by 2 units/kg/hr to 1600 units/hr. Check PTT in 8 hours.   Lonnie Roberts, PharmD, BCPS 07/17/2023 11:27 PM

## 2023-07-17 NOTE — Plan of Care (Signed)

## 2023-07-17 NOTE — Plan of Care (Signed)
  Problem: Education: Goal: Knowledge of General Education information will improve Description: Including pain rating scale, medication(s)/side effects and non-pharmacologic comfort measures Outcome: Progressing   Problem: Health Behavior/Discharge Planning: Goal: Ability to manage health-related needs will improve Outcome: Progressing   Problem: Clinical Measurements: Goal: Ability to maintain clinical measurements within normal limits will improve Outcome: Progressing Goal: Will remain free from infection Outcome: Progressing Goal: Diagnostic test results will improve Outcome: Progressing Goal: Respiratory complications will improve Outcome: Progressing Goal: Cardiovascular complication will be avoided Outcome: Progressing   Problem: Activity: Goal: Risk for activity intolerance will decrease Outcome: Progressing   Problem: Nutrition: Goal: Adequate nutrition will be maintained Outcome: Progressing   Problem: Coping: Goal: Level of anxiety will decrease Outcome: Progressing   Problem: Elimination: Goal: Will not experience complications related to bowel motility Outcome: Progressing Goal: Will not experience complications related to urinary retention Outcome: Progressing   Problem: Pain Managment: Goal: General experience of comfort will improve and/or be controlled Outcome: Progressing   Problem: Safety: Goal: Ability to remain free from injury will improve Outcome: Progressing   Problem: Skin Integrity: Goal: Risk for impaired skin integrity will decrease Outcome: Progressing   Problem: Education: Goal: Knowledge of disease or condition will improve Outcome: Progressing Goal: Understanding of medication regimen will improve Outcome: Progressing Goal: Individualized Educational Video(s) Outcome: Progressing   Problem: Activity: Goal: Ability to tolerate increased activity will improve Outcome: Progressing   Problem: Cardiac: Goal: Ability to achieve  and maintain adequate cardiopulmonary perfusion will improve Outcome: Progressing   Problem: Health Behavior/Discharge Planning: Goal: Ability to safely manage health-related needs after discharge will improve Outcome: Progressing   Problem: Activity: Goal: Ability to tolerate increased activity will improve Outcome: Progressing   Problem: Respiratory: Goal: Ability to maintain a clear airway and adequate ventilation will improve Outcome: Progressing   Problem: Role Relationship: Goal: Method of communication will improve Outcome: Progressing   Problem: Education: Goal: Ability to describe self-care measures that may prevent or decrease complications (Diabetes Survival Skills Education) will improve Outcome: Progressing Goal: Individualized Educational Video(s) Outcome: Progressing   Problem: Coping: Goal: Ability to adjust to condition or change in health will improve Outcome: Progressing   Problem: Fluid Volume: Goal: Ability to maintain a balanced intake and output will improve Outcome: Progressing   Problem: Health Behavior/Discharge Planning: Goal: Ability to identify and utilize available resources and services will improve Outcome: Progressing Goal: Ability to manage health-related needs will improve Outcome: Progressing   Problem: Metabolic: Goal: Ability to maintain appropriate glucose levels will improve Outcome: Progressing   Problem: Nutritional: Goal: Maintenance of adequate nutrition will improve Outcome: Progressing Goal: Progress toward achieving an optimal weight will improve Outcome: Progressing   Problem: Skin Integrity: Goal: Risk for impaired skin integrity will decrease Outcome: Progressing   Problem: Tissue Perfusion: Goal: Adequacy of tissue perfusion will improve Outcome: Progressing

## 2023-07-18 DIAGNOSIS — K922 Gastrointestinal hemorrhage, unspecified: Secondary | ICD-10-CM | POA: Diagnosis not present

## 2023-07-18 LAB — GLUCOSE, CAPILLARY
Glucose-Capillary: 129 mg/dL — ABNORMAL HIGH (ref 70–99)
Glucose-Capillary: 137 mg/dL — ABNORMAL HIGH (ref 70–99)
Glucose-Capillary: 138 mg/dL — ABNORMAL HIGH (ref 70–99)
Glucose-Capillary: 147 mg/dL — ABNORMAL HIGH (ref 70–99)
Glucose-Capillary: 150 mg/dL — ABNORMAL HIGH (ref 70–99)
Glucose-Capillary: 152 mg/dL — ABNORMAL HIGH (ref 70–99)
Glucose-Capillary: 161 mg/dL — ABNORMAL HIGH (ref 70–99)
Glucose-Capillary: 163 mg/dL — ABNORMAL HIGH (ref 70–99)

## 2023-07-18 LAB — CBC
HCT: 34.4 % — ABNORMAL LOW (ref 36.0–46.0)
Hemoglobin: 10.9 g/dL — ABNORMAL LOW (ref 12.0–15.0)
MCH: 31.4 pg (ref 26.0–34.0)
MCHC: 31.7 g/dL (ref 30.0–36.0)
MCV: 99.1 fL (ref 80.0–100.0)
Platelets: 516 10*3/uL — ABNORMAL HIGH (ref 150–400)
RBC: 3.47 MIL/uL — ABNORMAL LOW (ref 3.87–5.11)
RDW: 13.4 % (ref 11.5–15.5)
WBC: 9.8 10*3/uL (ref 4.0–10.5)
nRBC: 0 % (ref 0.0–0.2)

## 2023-07-18 LAB — HEPARIN LEVEL (UNFRACTIONATED): Heparin Unfractionated: >1.1 [IU]/mL — ABNORMAL HIGH (ref 0.30–0.70)

## 2023-07-18 LAB — APTT: aPTT: 85 s — ABNORMAL HIGH (ref 24–36)

## 2023-07-18 NOTE — Progress Notes (Signed)
   07/18/23 2000  BiPAP/CPAP/SIPAP  Reason BIPAP/CPAP not in use NG tube in place;Other(comment) (Pt does not wear CPAP/bipap)

## 2023-07-18 NOTE — Progress Notes (Addendum)
 PHARMACY - ANTICOAGULATION CONSULT NOTE  Pharmacy Consult for heparin  (apixaban  on hold) Indication: atrial fibrillation  Allergies  Allergen Reactions   Cephalosporins Rash    Tolerated ceftriaxone  06/2023   Keflex [Cephalexin] Rash    Patient Measurements: Height: 5\' 10"  (177.8 cm) Weight: 96.5 kg (212 lb 11.9 oz) IBW/kg (Calculated) : 68.5 HEPARIN  DW (KG): 91  Vital Signs: Temp: 97.9 F (36.6 C) (05/04 0835) Temp Source: Oral (05/04 0835) BP: 124/63 (05/04 0835) Pulse Rate: 98 (05/04 0835)  Labs: Recent Labs    07/17/23 2240 07/18/23 0827 07/18/23 0828  HGB  --  10.9*  --   HCT  --  34.4*  --   PLT  --  516*  --   APTT 50*  --  85*  HEPARINUNFRC  --   --  >1.10*    Estimated Creatinine Clearance: 67 mL/min (by C-G formula based on SCr of 0.69 mg/dL).   Medical History: Past Medical History:  Diagnosis Date   Anxiety    Chronic lower back pain    Depression    DJD (degenerative joint disease)    knees, neck   GERD (gastroesophageal reflux disease)    has had dilitation in the past   Hypertension     Assessment: 83 YO female with PMH significant for atrial fibrillation on Eliquis  admitted for hiatal hernia with oozing erosions leaking to UGIB. Plan operative repair currently scheduled for 5/07. Apixaban  now held, last dose 5/01 @ 2200. Pharmacy consulted to initiate IV heparin  in interim.  CBC stable, hgb 10.9, platelets 516. No issues with bleeding noted while on heparin . Heparin  dose increased yesterday due to subtherapeutic levels. aPTT therapeutic this morning x2. HL still elevated at >1.10. Will go to daily levels, continuing aPTT until heparin  level is concordant.  Goal of Therapy:  Heparin  level 0.3-0.7 units/ml aPTT 66-102 seconds Monitor platelets by anticoagulation protocol: Yes   Plan:  Continue heparin  infusion at 1600 units/hr Check heparin  level and aPTT daily while on heparin  Continue to monitor H&H and platelets  Thank you for  allowing pharmacy to be a part of this patient's care.  Estela Held, PharmD PGY-2 Infectious Diseases Pharmacy Resident [facility] 07/18/2023 10:21 AM

## 2023-07-18 NOTE — Plan of Care (Signed)
  Problem: Education: Goal: Knowledge of General Education information will improve Description: Including pain rating scale, medication(s)/side effects and non-pharmacologic comfort measures Outcome: Progressing   Problem: Health Behavior/Discharge Planning: Goal: Ability to manage health-related needs will improve Outcome: Progressing   Problem: Clinical Measurements: Goal: Ability to maintain clinical measurements within normal limits will improve Outcome: Progressing Goal: Will remain free from infection Outcome: Progressing Goal: Diagnostic test results will improve Outcome: Progressing Goal: Respiratory complications will improve Outcome: Progressing Goal: Cardiovascular complication will be avoided Outcome: Progressing   Problem: Activity: Goal: Risk for activity intolerance will decrease Outcome: Progressing   Problem: Nutrition: Goal: Adequate nutrition will be maintained Outcome: Progressing   Problem: Coping: Goal: Level of anxiety will decrease Outcome: Progressing   Problem: Elimination: Goal: Will not experience complications related to bowel motility Outcome: Progressing Goal: Will not experience complications related to urinary retention Outcome: Progressing   Problem: Pain Managment: Goal: General experience of comfort will improve and/or be controlled Outcome: Progressing   Problem: Safety: Goal: Ability to remain free from injury will improve Outcome: Progressing   Problem: Skin Integrity: Goal: Risk for impaired skin integrity will decrease Outcome: Progressing   Problem: Education: Goal: Knowledge of disease or condition will improve Outcome: Progressing Goal: Understanding of medication regimen will improve Outcome: Progressing Goal: Individualized Educational Video(s) Outcome: Progressing   Problem: Activity: Goal: Ability to tolerate increased activity will improve Outcome: Progressing   Problem: Cardiac: Goal: Ability to achieve  and maintain adequate cardiopulmonary perfusion will improve Outcome: Progressing   Problem: Health Behavior/Discharge Planning: Goal: Ability to safely manage health-related needs after discharge will improve Outcome: Progressing   Problem: Activity: Goal: Ability to tolerate increased activity will improve Outcome: Progressing   Problem: Respiratory: Goal: Ability to maintain a clear airway and adequate ventilation will improve Outcome: Progressing   Problem: Role Relationship: Goal: Method of communication will improve Outcome: Progressing   Problem: Education: Goal: Ability to describe self-care measures that may prevent or decrease complications (Diabetes Survival Skills Education) will improve Outcome: Progressing Goal: Individualized Educational Video(s) Outcome: Progressing   Problem: Coping: Goal: Ability to adjust to condition or change in health will improve Outcome: Progressing   Problem: Fluid Volume: Goal: Ability to maintain a balanced intake and output will improve Outcome: Progressing   Problem: Health Behavior/Discharge Planning: Goal: Ability to identify and utilize available resources and services will improve Outcome: Progressing Goal: Ability to manage health-related needs will improve Outcome: Progressing   Problem: Metabolic: Goal: Ability to maintain appropriate glucose levels will improve Outcome: Progressing   Problem: Nutritional: Goal: Maintenance of adequate nutrition will improve Outcome: Progressing Goal: Progress toward achieving an optimal weight will improve Outcome: Progressing   Problem: Skin Integrity: Goal: Risk for impaired skin integrity will decrease Outcome: Progressing   Problem: Tissue Perfusion: Goal: Adequacy of tissue perfusion will improve Outcome: Progressing

## 2023-07-18 NOTE — Progress Notes (Signed)
 PROGRESS NOTE    Darlene [name]  VWU:[id number] DOB: 08/28/1940 DOA: 06/29/2023 PCP: Jenelle Mis, FNP   Brief Narrative:  Darlene [name] is a 83 y.o. F with PMH significant for atrial fibrillation on eliquis , OSA on CPAP, HTN, HLD, GERD and esophageal strictures.  She presents to our facility with ongoing coffee-ground emesis on 06/29/2023.  CT abdomen pelvis with contrast not revealing of any acute source of bleeding although noted high-grade stenosis of the celiac axis and collateral flow from the superior mesenteric artery-patient previously scheduled for hiatal hernia and spigelian hernia repair with Dr. Jamse Mcgee later this month.  Initially admitted for EGD planned for 06/30/2023, overnight noted to transition to A-fib with RVR with profound hypoxia during the event with noted pulmonary edema and large volume aspiration -initially placed on BiPAP with worsening respiratory status ultimately intubated for airway protection.  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  Ultimately extubated 07/04/23.  ICU delirium improving over the past week, respiratory status improving, echo showing moderate to severe right ventricular dysfunction.  Patient required transient pressors in the interim likely in setting of sepsis due to large volume aspiration pneumonia.  Patient weaned to nasal cannula, off pressors now transition to p.o. midodrine  and otherwise stable for transfer to hospital floor and out of the ICU.  Hospital course 4/15 admit with coffee ground emesis for inpatient EGD 4/16 afib with RVR, concern for pulm edema, PCCM consult and ICU txfr 4/17: ETT air leak, fixed with more balloon inflation. On Precedex  and Fentanyl  drips for sedation. Upper endoscopy-->Cameron ulcers on hiatal hernia.  A-fib rate controlled on amiodarone . 4/21 Complaints of epigastric pain vs delirium overnight, still on amio gtt/heparin . Tube feeds paused this early this morning.    4/22 Delirium improving, patient had BM, Pulmonary Edema vs BL infiltrates on CXR-, CVP 18 -diuresed with lasix  and metolazone  4/24 Diuresed 3.5 L with metolazone /Lasix  4/26 Placed on BiPAP for increased WOB 4/27 Point-of-care echo showed moderate to severe RV dysfunction.  LV function appears normal.  There is consolidation at both bases with a trivial effusion unsuitable for thoracentesis.  Started on norepinephrine  to support blood pressure as patient required Precedex  for delirium, which exacerbated hypotension 4/28 thoracentesis. Right 100 ml. Exudate by lights. Likely parapneumonic  4/29 off norepi PCXR improved. WBC nml pct neg stopped abx. Feeling better  4/30 weaned down to 4L [place] - transition to TRH 5/01 to present: Continue to wean patient off oxygen, PT OT following, possible plan for hernia repair in the next 72 hours pending OR availability. Transition off Eliquis  to heparin  preoperatively.  Assessment & Plan:   Principal Problem:   Acute GI bleeding Active Problems:   Intractable nausea and vomiting   Acute respiratory failure with hypoxia (HCC)   Aspiration into airway   Hiatal hernia   Acute pulmonary edema (HCC)   Acute hypoxic and hypercapnic respiratory failure Aspiration pneumonia in setting of large hiatal hernia Cannot rule out septic shock Complicated by volume overload/parapneumonic pleural effusion - Oxygen weaning appropriately, approaching euvolemia - Completed antibiotic course for aspiration pneumonia coverage - Tube feeds ongoing via core track, remains n.p.o. in the setting of large hiatal hernia and increased risk of aspiration and vomiting - PT OT following, out of bed to chair as tolerated  Acute versus subacute GI bleed Hiatal hernia with oozing Cameron erosions - Hemoglobin stable, hold Eliquis , transition to heparin  as above perioperatively -GI following EGD 4/17 confirms Cameron ulcers on  hiatal hernia - General Surgery following -plan for  hiatal hernia repair next week pending OR availability  Hypotension, improving Questionably iatrogenic versus septic shock given above Orthostatic hypotension ongoing -IV pressors, nor epi, off 4/ 29  -Continue midodrine  at 10mg  tid  -Profoundly hypotensive 5/2 with orthostatics, blood pressure 70s/40s - improving over the last 48h   A-fib with RVR, rate currently controlled On Eliquis  at baseline -Rate now controlled on p.o. amiodarone , off diltiazem  and beta-blocker given hypotension as above  - Eliquis  discontinued, currently on heparin  drip perioperatively   Severe deconditioning Ambulatory dysfunction PT OT following, discussed with family and staff to encourage ambulation, even short distances frequently throughout the day   Hyperglycemia without history of diabetes Continue sliding scale insulin , hypoglycemic protocol A1c previously within normal limits 5.6  DVT prophylaxis: Place and maintain sequential compression device Start: 07/01/23 0737 SCDs Start: 06/29/23 1509 Code Status:   Code Status: Full Code Family Communication: At bedside  Status is: Inpatient Dispo: The patient is from: Home              Anticipated d/c is to: To be determined              Anticipated d/c date is: 72+ hours              Patient currently not medically stable for discharge given ongoing need for surgical intervention, NG tube feeds and medical management  Consultants:  PCCM, GI, general surgery  Antimicrobials:  Previously completed  Subjective: No acute issues or events overnight, denies nausea vomiting diarrhea constipation headache fevers chills or chest pain.  No further episodes of orthostatic symptoms. Objective: Vitals:   07/17/23 1600 07/17/23 2028 07/18/23 0500 07/18/23 0624  BP:  (!) 116/59 129/69 129/69  Pulse:  93    Resp:  16 16 16   Temp:  98 F (36.7 C) 97.7 F (36.5 C) 97.7 F (36.5 C)  TempSrc:  Oral Oral Oral  SpO2: 94% 98% 99% 99%  Weight:      Height:         Intake/Output Summary (Last 24 hours) at 07/18/2023 0818 Last data filed at 07/17/2023 2045 Gross per 24 hour  Intake 1773.41 ml  Output 1400 ml  Net 373.41 ml   Filed Weights   07/04/23 0544 07/05/23 0522 07/11/23 0500  Weight: 99.5 kg 98 kg 96.5 kg    Examination:  General:  Pleasantly resting in bed, No acute distress. HEENT:  Normocephalic atraumatic.  NG tube feeds ongoing, nasal cannula noted. Neck:  Without mass or deformity.  Trachea is midline. Lungs: Scant bibasilar rales without overt wheeze or rhonchi. Heart: Irregularly irregular. Abdomen:  Soft, nontender, nondistended. Extremities: Without cyanosis, clubbing, edema, or obvious deformity.  Data Reviewed: I have personally reviewed following labs and imaging studies  CBC: Recent Labs  Lab 07/12/23 0318 07/13/23 0405 07/15/23 0315  WBC 15.2* 9.7 9.5  HGB 10.4* 10.0* 10.3*  HCT 32.4* 31.1* 32.5*  MCV 99.1 97.8 99.7  PLT 358 390 465*   Basic Metabolic Panel: Recent Labs  Lab 07/12/23 0318 07/13/23 0405 07/14/23 0327 07/15/23 0315  NA 137 136 136 138  K 3.5 3.3* 3.5 3.7  CL 93* 91* 94* 98  CO2 33* 33* 31 30  GLUCOSE 188* 199* 146* 114*  BUN 28* 31* 28* 27*  CREATININE 0.84 0.82 0.69 0.69  CALCIUM 9.2 9.0 9.0 9.0  MG  --  1.9 2.1 2.1  PHOS  --  3.8  --  3.0  GFR: Estimated Creatinine Clearance: 67 mL/min (by C-G formula based on SCr of 0.69 mg/dL).  Liver Function Tests: Recent Labs  Lab 07/12/23 1757  PROT 5.9*   CBG: Recent Labs  Lab 07/17/23 1611 07/17/23 2038 07/18/23 0030 07/18/23 0517 07/18/23 0620  GLUCAP 128* 120* 147* 152* 138*   Lipid Profile: No results for input(s): "CHOL", "HDL", "LDLCALC", "TRIG", "CHOLHDL", "LDLDIRECT" in the last 72 hours.  Sepsis Labs: Recent Labs  Lab 07/12/23 0318 07/13/23 0405 07/14/23 0327  PROCALCITON 0.14 0.12 <0.10    Recent Results (from the past 240 hours)  Body fluid culture w Gram Stain     Status: None   Collection Time:  07/12/23  4:50 PM   Specimen: Pleura; Body Fluid  Result Value Ref Range Status   Specimen Description PLEURAL  Final   Special Requests RIGHT LUNG  Final   Gram Stain   Final    RARE WBC PRESENT, PREDOMINANTLY MONONUCLEAR NO ORGANISMS SEEN    Culture   Final    NO GROWTH 3 DAYS Performed at [facility], [address] [place], [place] [address]    Report Status 07/16/2023 FINAL  Final    Radiology Studies: No results found.  Scheduled Meds:  amiodarone   200 mg Per Tube Daily   busPIRone   5 mg Per Tube TID   Chlorhexidine  Gluconate Cloth  6 each Topical Q0600   docusate  100 mg Per Tube BID   escitalopram   20 mg Per Tube Daily   furosemide   40 mg Intravenous BID   insulin  aspart  0-20 Units Subcutaneous Q4H   lidocaine   1 patch Transdermal Q24H   melatonin  3 mg Per Tube QHS   midodrine   10 mg Per Tube Q8H   mouth rinse  15 mL Mouth Rinse 4 times per day   pantoprazole  (PROTONIX ) IV  40 mg Intravenous Q12H   polyethylene glycol  17 g Per Tube Daily   sodium chloride  flush  10-40 mL Intracatheter Q12H   Continuous Infusions:  feeding supplement (VITAL 1.5 CAL) 1,000 mL (07/17/23 1907)   heparin  1,600 Units/hr (07/18/23 0453)     LOS: 18 days   Time spent:  Haydee Lipa, DO [facility]  If 7PM-7AM, please contact night-coverage [email or web address]  07/18/2023, 8:18 AM

## 2023-07-19 DIAGNOSIS — K922 Gastrointestinal hemorrhage, unspecified: Secondary | ICD-10-CM | POA: Diagnosis not present

## 2023-07-19 LAB — CBC
HCT: 35.1 % — ABNORMAL LOW (ref 36.0–46.0)
Hemoglobin: 11.3 g/dL — ABNORMAL LOW (ref 12.0–15.0)
MCH: 31.7 pg (ref 26.0–34.0)
MCHC: 32.2 g/dL (ref 30.0–36.0)
MCV: 98.6 fL (ref 80.0–100.0)
Platelets: 525 10*3/uL — ABNORMAL HIGH (ref 150–400)
RBC: 3.56 MIL/uL — ABNORMAL LOW (ref 3.87–5.11)
RDW: 13.2 % (ref 11.5–15.5)
WBC: 11.5 10*3/uL — ABNORMAL HIGH (ref 4.0–10.5)
nRBC: 0 % (ref 0.0–0.2)

## 2023-07-19 LAB — GLUCOSE, CAPILLARY
Glucose-Capillary: 113 mg/dL — ABNORMAL HIGH (ref 70–99)
Glucose-Capillary: 127 mg/dL — ABNORMAL HIGH (ref 70–99)
Glucose-Capillary: 131 mg/dL — ABNORMAL HIGH (ref 70–99)
Glucose-Capillary: 133 mg/dL — ABNORMAL HIGH (ref 70–99)
Glucose-Capillary: 153 mg/dL — ABNORMAL HIGH (ref 70–99)
Glucose-Capillary: 154 mg/dL — ABNORMAL HIGH (ref 70–99)

## 2023-07-19 LAB — HEPARIN LEVEL (UNFRACTIONATED): Heparin Unfractionated: >1.1 [IU]/mL — ABNORMAL HIGH (ref 0.30–0.70)

## 2023-07-19 LAB — BASIC METABOLIC PANEL WITH GFR
Anion gap: 7 (ref 5–15)
BUN: 27 mg/dL — ABNORMAL HIGH (ref 8–23)
CO2: 35 mmol/L — ABNORMAL HIGH (ref 22–32)
Calcium: 9.1 mg/dL (ref 8.9–10.3)
Chloride: 96 mmol/L — ABNORMAL LOW (ref 98–111)
Creatinine, Ser: 0.7 mg/dL (ref 0.44–1.00)
GFR, Estimated: >60 mL/min (ref >60)
Glucose, Bld: 133 mg/dL — ABNORMAL HIGH (ref 70–99)
Potassium: 3.1 mmol/L — ABNORMAL LOW (ref 3.5–5.1)
Sodium: 138 mmol/L (ref 135–145)

## 2023-07-19 LAB — APTT
aPTT: 115 s — ABNORMAL HIGH (ref 24–36)
aPTT: 81 s — ABNORMAL HIGH (ref 24–36)

## 2023-07-19 MED ORDER — POTASSIUM CHLORIDE 20 MEQ PO PACK
40.0000 meq | PACK | Freq: Two times a day (BID) | ORAL | Status: AC
  Administered 2023-07-19 (×2): 40 meq
  Filled 2023-07-19 (×2): qty 2

## 2023-07-19 NOTE — Progress Notes (Incomplete)
 PHARMACY - ANTICOAGULATION Pharmacy Consult for heparin   Indication: atrial fibrillation   Allergies  Allergen Reactions   Cephalosporins Rash    Tolerated ceftriaxone  06/2023   Keflex [Cephalexin] Rash    Patient Measurements: Height: 5\' 10"  (177.8 cm) Weight: 96.5 kg (212 lb 11.9 oz) IBW/kg (Calculated) : 68.5 HEPARIN  DW (KG): 91  Vital Signs: Temp: 98.1 F (36.7 C) (05/05 0500) Temp Source: Oral (05/05 0500) BP: 115/61 (05/05 0500) Pulse Rate: 66 (05/05 0500)  Labs: Recent Labs    07/17/23 2240 07/18/23 0827 07/18/23 0828 07/19/23 0450  HGB  --  10.9*  --  11.3*  HCT  --  34.4*  --  35.1*  PLT  --  516*  --  525*  APTT 50*  --  85* 115*  HEPARINUNFRC  --   --  >1.10* >1.10*  CREATININE  --   --   --  0.70    Estimated Creatinine Clearance: 67 mL/min (by C-G formula based on SCr of 0.7 mg/dL).   Assessment: 83 y.o. female with h/o Afib on apixaban  PTA, Eliquis  on hold pending hiatal hernia repair and PEG on 5/7. Last apixaban  5/2. Pharmacy consulted for heparin .  Heparin  level *** is ***therapeutic on 1450 units/hr.  Goal of Therapy:  Heparin  level 0.3-0.7 units/ml aPTT 66-102 seconds Monitor platelets by anticoagulation protocol: Yes   Plan:  *** Heparin  1450 units/hr  F/u aPTT until correlates with heparin  level  Monitor daily aPTT, heparin  level, CBC, signs/symptoms of bleeding  F/u restart apixaban  post op 5/7  Dorene Gang, PharmD, BCPS, [facility] Clinical Pharmacist  Please check AMION for all [facility] Pharmacy phone numbers After 10:00 PM, call Main Pharmacy [phone]

## 2023-07-19 NOTE — Progress Notes (Signed)
 PROGRESS NOTE    Darlene [name]  Darlene [name]:[id number] DOB: 01/03/1941 DOA: 06/29/2023 PCP: Jenelle Mis, FNP   Brief Narrative:  Darlene [name] is a 83 y.o. F with PMH significant for atrial fibrillation on eliquis , OSA on CPAP, HTN, HLD, GERD and esophageal strictures.  She presents to our facility with ongoing coffee-ground emesis on 06/29/2023.  CT abdomen pelvis with contrast not revealing of any acute source of bleeding although noted high-grade stenosis of the celiac axis and collateral flow from the superior mesenteric artery-patient previously scheduled for hiatal hernia and spigelian hernia repair with Dr. Jamse Mcgee later this month.  Initially admitted for EGD planned for 06/30/2023, overnight noted to transition to A-fib with RVR with profound hypoxia during the event with noted pulmonary edema and large volume aspiration -initially placed on BiPAP with worsening respiratory status ultimately intubated for airway protection.  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  Ultimately extubated 07/04/23.  ICU delirium improving over the past week, respiratory status improving, echo showing moderate to severe right ventricular dysfunction.  Patient required transient pressors in the interim likely in setting of sepsis due to large volume aspiration pneumonia.  Patient weaned to nasal cannula, off pressors now transition to p.o. midodrine  and otherwise stable for transfer to hospital floor and out of the ICU.  Hospital course 4/15 admit with coffee ground emesis for inpatient EGD 4/16 afib with RVR, concern for pulm edema, PCCM consult and ICU txfr 4/17: ETT air leak, fixed with more balloon inflation. On Precedex  and Fentanyl  drips for sedation. Upper endoscopy-->Cameron ulcers on hiatal hernia.  A-fib rate controlled on amiodarone . 4/21 Complaints of epigastric pain vs delirium overnight, still on amio gtt/heparin . Tube feeds paused this early this morning.    4/22 Delirium improving, patient had BM, Pulmonary Edema vs BL infiltrates on CXR-, CVP 18 -diuresed with lasix  and metolazone  4/24 Diuresed 3.5 L with metolazone /Lasix  4/26 Placed on BiPAP for increased WOB 4/27 Point-of-care echo showed moderate to severe RV dysfunction.  LV function appears normal.  There is consolidation at both bases with a trivial effusion unsuitable for thoracentesis.  Started on norepinephrine  to support blood pressure as patient required Precedex  for delirium, which exacerbated hypotension 4/28 thoracentesis. Right 100 ml. Exudate by lights. Likely parapneumonic  4/29 off norepi PCXR improved. WBC nml pct neg stopped abx. Feeling better  4/30 weaned down to 4L [place] - transition to TRH 5/01 to present: Continue to wean patient off oxygen, PT OT following, possible plan for hernia repair 07/21/23 pending OR availability. Transition off Eliquis  to heparin  preoperatively.  Assessment & Plan:   Principal Problem:   Acute GI bleeding Active Problems:   Intractable nausea and vomiting   Acute respiratory failure with hypoxia (HCC)   Aspiration into airway   Hiatal hernia   Acute pulmonary edema (HCC)   Acute hypoxic and hypercapnic respiratory failure Aspiration pneumonia in setting of large hiatal hernia Cannot rule out septic shock Complicated by volume overload/parapneumonic pleural effusion - Oxygen weaning appropriately, approaching euvolemia - Completed antibiotic course for aspiration pneumonia coverage - Tube feeds ongoing via core track, remains n.p.o. in the setting of large hiatal hernia and increased risk of aspiration and vomiting - PT OT following, out of bed to chair as tolerated  Acute versus subacute GI bleed Hiatal hernia with oozing Cameron erosions - Hemoglobin stable, hold Eliquis , transition to heparin  as above perioperatively -GI following EGD 4/17 confirms Cameron ulcers on hiatal hernia - General  Surgery following -plan for hiatal hernia  repair next week pending OR availability  Hypotension, improving Questionably iatrogenic versus septic shock given above Orthostatic hypotension ongoing -IV pressors, nor epi, off 4/ 29  -Continue midodrine  at 10mg  tid  -Profoundly hypotensive 5/2 with orthostatics, patient has been hesitant to interact with physical therapy/mobility since this time, encouraged to increase activity to avoid prolonged SNF stay discharge   A-fib with RVR, rate currently controlled On Eliquis  at baseline -Rate now controlled on p.o. amiodarone , off diltiazem  and beta-blocker given hypotension as above  - Eliquis  discontinued, currently on heparin  drip perioperatively   Severe deconditioning Ambulatory dysfunction PT OT following, discussed with family and staff to encourage ambulation, even short distances frequently throughout the day   Hyperglycemia without history of diabetes Continue sliding scale insulin , hypoglycemic protocol A1c previously within normal limits 5.6  DVT prophylaxis: Place and maintain sequential compression device Start: 07/01/23 0737 SCDs Start: 06/29/23 1509 Code Status:   Code Status: Full Code Family Communication: At bedside  Status is: Inpatient Dispo: The patient is from: Home              Anticipated d/c is to: To be determined              Anticipated d/c date is: 72+ hours              Patient currently not medically stable for discharge given ongoing need for surgical intervention, NG tube feeds and medical management  Consultants:  PCCM, GI, general surgery  Antimicrobials:  Previously completed  Subjective: No acute issues or events overnight, denies nausea vomiting diarrhea constipation headache fevers chills or chest pain.  No further episodes of orthostatic symptoms. Objective: Vitals:   07/18/23 0835 07/18/23 1402 07/18/23 1959 07/19/23 0500  BP: 124/63 (!) 119/52 120/61 115/61  Pulse: 98 92 (!) 106 66  Resp: 14 16 16 18   Temp: 97.9 F (36.6 C) 97.8  F (36.6 C) 98.2 F (36.8 C) 98.1 F (36.7 C)  TempSrc: Oral Oral Oral Oral  SpO2: 96% 98% 98% 95%  Weight:      Height:        Intake/Output Summary (Last 24 hours) at 07/19/2023 0743 Last data filed at 07/19/2023 0300 Gross per 24 hour  Intake --  Output 1400 ml  Net -1400 ml   Filed Weights   07/04/23 0544 07/05/23 0522 07/11/23 0500  Weight: 99.5 kg 98 kg 96.5 kg    Examination:  General:  Pleasantly resting in bed, No acute distress. HEENT:  Normocephalic atraumatic.  NG tube feeds ongoing, nasal cannula noted. Neck:  Without mass or deformity.  Trachea is midline. Lungs: Scant bibasilar rales without overt wheeze or rhonchi. Heart: Irregularly irregular. Abdomen:  Soft, nontender, nondistended. Extremities: Without cyanosis, clubbing, edema, or obvious deformity.  Data Reviewed: I have personally reviewed following labs and imaging studies  CBC: Recent Labs  Lab 07/13/23 0405 07/15/23 0315 07/18/23 0827 07/19/23 0450  WBC 9.7 9.5 9.8 11.5*  HGB 10.0* 10.3* 10.9* 11.3*  HCT 31.1* 32.5* 34.4* 35.1*  MCV 97.8 99.7 99.1 98.6  PLT 390 465* 516* 525*   Basic Metabolic Panel: Recent Labs  Lab 07/13/23 0405 07/14/23 0327 07/15/23 0315 07/19/23 0450  NA 136 136 138 138  K 3.3* 3.5 3.7 3.1*  CL 91* 94* 98 96*  CO2 33* 31 30 35*  GLUCOSE 199* 146* 114* 133*  BUN 31* 28* 27* 27*  CREATININE 0.82 0.69 0.69 0.70  CALCIUM 9.0 9.0 9.0  9.1  MG 1.9 2.1 2.1  --   PHOS 3.8  --  3.0  --    GFR: Estimated Creatinine Clearance: 67 mL/min (by C-G formula based on SCr of 0.7 mg/dL).  Liver Function Tests: Recent Labs  Lab 07/12/23 1757  PROT 5.9*   CBG: Recent Labs  Lab 07/18/23 1659 07/18/23 1940 07/18/23 2138 07/19/23 0032 07/19/23 0444  GLUCAP 137* 150* 163* 154* 127*   Lipid Profile: No results for input(s): "CHOL", "HDL", "LDLCALC", "TRIG", "CHOLHDL", "LDLDIRECT" in the last 72 hours.  Sepsis Labs: Recent Labs  Lab 07/13/23 0405 07/14/23 0327   PROCALCITON 0.12 <0.10    Recent Results (from the past 240 hours)  Body fluid culture w Gram Stain     Status: None   Collection Time: 07/12/23  4:50 PM   Specimen: Pleura; Body Fluid  Result Value Ref Range Status   Specimen Description PLEURAL  Final   Special Requests RIGHT LUNG  Final   Gram Stain   Final    RARE WBC PRESENT, PREDOMINANTLY MONONUCLEAR NO ORGANISMS SEEN    Culture   Final    NO GROWTH 3 DAYS Performed at [facility], [address] [place], [place] [address]    Report Status 07/16/2023 FINAL  Final    Radiology Studies: No results found.  Scheduled Meds:  amiodarone   200 mg Per Tube Daily   busPIRone   5 mg Per Tube TID   Chlorhexidine  Gluconate Cloth  6 each Topical Q0600   docusate  100 mg Per Tube BID   escitalopram   20 mg Per Tube Daily   furosemide   40 mg Intravenous BID   insulin  aspart  0-20 Units Subcutaneous Q4H   lidocaine   1 patch Transdermal Q24H   melatonin  3 mg Per Tube QHS   midodrine   10 mg Per Tube Q8H   mouth rinse  15 mL Mouth Rinse 4 times per day   pantoprazole  (PROTONIX ) IV  40 mg Intravenous Q12H   polyethylene glycol  17 g Per Tube Daily   sodium chloride  flush  10-40 mL Intracatheter Q12H   Continuous Infusions:  feeding supplement (VITAL 1.5 CAL) 1,000 mL (07/17/23 1907)   heparin  1,450 Units/hr (07/19/23 0652)     LOS: 19 days   Time spent:  Haydee Lipa, DO [facility]  If 7PM-7AM, please contact night-coverage [email or web address]  07/19/2023, 7:43 AM

## 2023-07-19 NOTE — Progress Notes (Signed)
 PHARMACY - ANTICOAGULATION Pharmacy Consult for heparin   Indication: atrial fibrillation Brief A/P: aPTT supratherapeutic Decrease Heparin  rate  Allergies  Allergen Reactions   Cephalosporins Rash    Tolerated ceftriaxone  06/2023   Keflex [Cephalexin] Rash    Patient Measurements: Height: 5\' 10"  (177.8 cm) Weight: 96.5 kg (212 lb 11.9 oz) IBW/kg (Calculated) : 68.5 HEPARIN  DW (KG): 91  Vital Signs: Temp: 98.1 F (36.7 C) (05/05 0500) Temp Source: Oral (05/05 0500) BP: 115/61 (05/05 0500) Pulse Rate: 66 (05/05 0500)  Labs: Recent Labs    07/17/23 2240 07/18/23 0827 07/18/23 0828 07/19/23 0450  HGB  --  10.9*  --  11.3*  HCT  --  34.4*  --  35.1*  PLT  --  516*  --  525*  APTT 50*  --  85* 115*  HEPARINUNFRC  --   --  >1.10* >1.10*  CREATININE  --   --   --  0.70    Estimated Creatinine Clearance: 67 mL/min (by C-G formula based on SCr of 0.7 mg/dL).   Assessment: 83 y.o. female with h/o Afib, Eliquis  on hold, for heparin   Goal of Therapy:  Heparin  level 0.3-0.7 units/ml aPTT 66-102 seconds Monitor platelets by anticoagulation protocol: Yes   Plan:  Decrease Heparin  1450 units/hr  Claudine Cullens, PharmD, BCPS  07/19/2023 6:37 AM

## 2023-07-19 NOTE — Progress Notes (Signed)
 Physical Therapy Treatment Patient Details Name: Darlene [name] MRN: [id number] DOB: 09/24/1940 Today's Date: 07/19/2023   History of Present Illness 83 yo female admitted 06/29/23 with acute hypoxic hypercapnic respiratory failure in the setting of aspiration complicated by GI bleed. Intubated 4/16-4/20. 4/17 EGD. PMhx: depression, DJD, OSA, pulmonary HTN, obesity, HLD, Afib    PT Comments  Pt able to make progress with mobility today, but continues to be limited by soft BP with upright positioning and onset of bowel movement. Pt able to follow commands and instructions, but does benefit from cues for safety and technique. Requiring up to modA to rise to standing and only able to complete x1 this session due to poor activity tolerance and dizziness. Pt left in chair position in bed, recommend frequent use of chair position in bed to assist with tolerance of upright activity.    VITALS:  - supine in bed- BP: 104/61 (74); HR: 105bpm - sitting EOB - BP: 83/52 (61); HR: 98bpm - sitting EOB after exercises - BP: 99/62 (74); HR: 108bpm - sitting after stand - BP: 114/119 (128); HR: 81bpm *suspect inflated due to pt moving her arm, sx persisted*     If plan is discharge home, recommend the following: A lot of help with bathing/dressing/bathroom;Assistance with cooking/housework;Direct supervision/assist for financial management;Assist for transportation;Two people to help with walking and/or transfers;Direct supervision/assist for medications management   Can travel by private vehicle     No  Equipment Recommendations  Hospital bed;Wheelchair (measurements PT);Hoyer lift    Recommendations for Other Services       Precautions / Restrictions Precautions Precautions: Fall Recall of Precautions/Restrictions: Impaired Precaution/Restrictions Comments: watch hypotension, SPO2, cortrak, incontinent Restrictions Weight Bearing Restrictions Per Provider Order: No     Mobility  Bed  Mobility Overal bed mobility: Needs Assistance Bed Mobility: Rolling, Supine to Sit, Sit to Supine Rolling: Contact guard assist, Used rails   Supine to sit: Min assist, HOB elevated, Used rails Sit to supine: Min assist, Used rails   General bed mobility comments: minA to LE and trunk, assist to complete transfer and steady in sitting    Transfers Overall transfer level: Needs assistance Equipment used: Rolling walker (2 wheels) Transfers: Sit to/from Stand Sit to Stand: Mod assist, From elevated surface           General transfer comment: completed x1 with pt reporting dizziness and fatigue. quick return to sitting and then pt limited by incontinence.    Ambulation/Gait                   Stairs             Wheelchair Mobility     Tilt Bed    Modified Rankin (Stroke Patients Only)       Balance Overall balance assessment: Needs assistance Sitting-balance support: Bilateral upper extremity supported, Feet supported Sitting balance-Leahy Scale: Fair Sitting balance - Comments: S initially at EOB though progressed to CGA for balance as symptomatic with low BP on EOB   Standing balance support: Bilateral upper extremity supported, Reliant on assistive device for balance, During functional activity Standing balance-Leahy Scale: Poor Standing balance comment: BUE support and minA                            Communication Communication Communication: No apparent difficulties  Cognition Arousal: Alert Behavior During Therapy: WFL for tasks assessed/performed   PT - Cognitive impairments: No apparent impairments  PT - Cognition Comments: not formally tested Following commands: Intact Following commands impaired: Only follows one step commands consistently    Cueing Cueing Techniques: Verbal cues, Gestural cues, Tactile cues  Exercises General Exercises - Lower Extremity Ankle Circles/Pumps: AROM, Both, 15  reps, Seated Long Arc Quad: AROM, Both, 10 reps (against resistance) Heel Slides: AROM, Both, 10 reps (against resistance) Hip ABduction/ADduction: Strengthening, Both, 10 reps (isometric against resistance) Hip Flexion/Marching: AROM, Both, 10 reps    General Comments General comments (skin integrity, edema, etc.): BP to low of 83/53 (61)      Pertinent Vitals/Pain Pain Assessment Pain Assessment: No/denies pain Faces Pain Scale: No hurt Pain Intervention(s): Monitored during session           PT Goals (current goals can now be found in the care plan section) Acute Rehab PT Goals Patient Stated Goal: return home PT Goal Formulation: With patient/family Time For Goal Achievement: 07/20/23 Potential to Achieve Goals: Fair Progress towards PT goals: Progressing toward goals    Frequency    Min 2X/week       AM-PAC PT "6 Clicks" Mobility   Outcome Measure  Help needed turning from your back to your side while in a flat bed without using bedrails?: A Lot Help needed moving from lying on your back to sitting on the side of a flat bed without using bedrails?: A Lot Help needed moving to and from a bed to a chair (including a wheelchair)?: Total Help needed standing up from a chair using your arms (e.g., wheelchair or bedside chair)?: Total Help needed to walk in hospital room?: Total Help needed climbing 3-5 steps with a railing? : Total 6 Click Score: 8    End of Session Equipment Utilized During Treatment: Gait belt Activity Tolerance: Treatment limited secondary to medical complications (Comment) (soft BP and need for BM) Patient left: in bed;with call bell/phone within reach;with nursing/sitter in room Nurse Communication: Mobility status PT Visit Diagnosis: Unsteadiness on feet (R26.81);Other abnormalities of gait and mobility (R26.89);Muscle weakness (generalized) (M62.81)     Time: 1610-9604 PT Time Calculation (min) (ACUTE ONLY): 33 min  Charges:     $Therapeutic Exercise: 8-22 mins $Therapeutic Activity: 8-22 mins PT General Charges $$ ACUTE PT VISIT: 1 Visit                     Barnabas Booth, PT, DPT   Acute Rehabilitation Department Office [phone] Secure Chat Communication Preferred   Lona Rist 07/19/2023, 4:23 PM

## 2023-07-19 NOTE — Progress Notes (Signed)
 18 Days Post-Op   Subjective/Chief Complaint: Hypotension improved. Denies abdominal pain and nausea. On room air.  Objective: Vital signs in last 24 hours: Temp:  [97.8 F (36.6 C)-99 F (37.2 C)] 99 F (37.2 C) (05/05 0831) Pulse Rate:  [66-106] 90 (05/05 0831) Resp:  [16-18] 18 (05/05 0831) BP: (115-129)/(52-77) 129/77 (05/05 0831) SpO2:  [92 %-98 %] 92 % (05/05 0831) Last BM Date : 07/16/23  Intake/Output from previous day: 05/04 0701 - 05/05 0700 In: -  Out: 1400 [Urine:1400] Intake/Output this shift: No intake/output data recorded.  Alert, NAD, frail Cortrak in place Unlabored respirations on room air Abdomen is soft, nondistended, and nontender  Lab Results:  Recent Labs    07/18/23 0827 07/19/23 0450  WBC 9.8 11.5*  HGB 10.9* 11.3*  HCT 34.4* 35.1*  PLT 516* 525*   BMET Recent Labs    07/19/23 0450  NA 138  K 3.1*  CL 96*  CO2 35*  GLUCOSE 133*  BUN 27*  CREATININE 0.70  CALCIUM 9.1   PT/INR No results for input(s): "LABPROT", "INR" in the last 72 hours. ABG No results for input(s): "PHART", "HCO3" in the last 72 hours.  Invalid input(s): "PCO2", "PO2"  Studies/Results: No results found.   Anti-infectives: Anti-infectives (From admission, onward)    Start     Dose/Rate Route Frequency Ordered Stop   07/09/23 0945  piperacillin -tazobactam (ZOSYN ) IVPB 3.375 g  Status:  Discontinued        3.375 g 12.5 mL/hr over 240 Minutes Intravenous Every 8 hours 07/09/23 0845 07/13/23 0842   07/05/23 0915  cefTRIAXone  (ROCEPHIN ) 2 g in sodium chloride  0.9 % 100 mL IVPB  Status:  Discontinued        2 g 200 mL/hr over 30 Minutes Intravenous Every 24 hours 07/05/23 0815 07/09/23 0845   06/30/23 2100  piperacillin -tazobactam (ZOSYN ) IVPB 3.375 g       Placed in "Followed by" Linked Group   3.375 g 12.5 mL/hr over 240 Minutes Intravenous Every 8 hours 06/30/23 1356 07/03/23 0016   06/30/23 1445  piperacillin -tazobactam (ZOSYN ) IVPB 3.375 g        Placed in "Followed by" Linked Group   3.375 g 100 mL/hr over 30 Minutes Intravenous  Once 06/30/23 1356 06/30/23 1626       Assessment/Plan:  Hiatal hernia with oozing Cameron erosions leaking to UGIB Spigelian hernia containing ascending colon  - will plan operative repair of at least [facility] this admission -currently scheduled for 5/7 at 08:30 with Dr. Lanell Pinta. I briefly reviewed the surgical plan again with patient and family today. - heparin  gtt - Continue tube feeds via Cortrak - Surgery will follow   I reviewed last 24 h vitals and pain scores, last 48 h intake and output, last 24 h labs and trends, last 24 h imaging results, and PCCM notes .   This care required moderate level of medical decision making.    LOS: 19 days    Lujean Sake 07/19/2023

## 2023-07-19 NOTE — Progress Notes (Signed)
 PHARMACY - ANTICOAGULATION Pharmacy Consult for heparin   Indication: atrial fibrillation  Allergies  Allergen Reactions   Cephalosporins Rash    Tolerated ceftriaxone  06/2023   Keflex [Cephalexin] Rash    Patient Measurements: Height: 5\' 10"  (177.8 cm) Weight: 97.5 kg (214 lb 15.2 oz) IBW/kg (Calculated) : 68.5 HEPARIN  DW (KG): 91  Vital Signs: Temp: 97.5 F (36.4 C) (05/05 1432) Temp Source: Oral (05/05 0500) BP: 104/58 (05/05 1432) Pulse Rate: 95 (05/05 1432)  Labs: Recent Labs    07/18/23 0827 07/18/23 0828 07/19/23 0450 07/19/23 1514  HGB 10.9*  --  11.3*  --   HCT 34.4*  --  35.1*  --   PLT 516*  --  525*  --   APTT  --  85* 115* 81*  HEPARINUNFRC  --  >1.10* >1.10*  --   CREATININE  --   --  0.70  --     Estimated Creatinine Clearance: 67.4 mL/min (by C-G formula based on SCr of 0.7 mg/dL).   Assessment: 83 y.o. female with h/o Afib, Eliquis  on hold, for heparin . aPTT this evening is therapeutic at 81 seconds.  Goal of Therapy:  Heparin  level 0.3-0.7 units/ml aPTT 66-102 seconds Monitor platelets by anticoagulation protocol: Yes   Plan:  Heparin  1450 units/h Daily aPTT, heparin  level, CBC  Levin Reamer, PharmD, BCPS, [facility] Clinical Pharmacist [phone] Please check AMION for all [facility] Pharmacy numbers 07/19/2023

## 2023-07-19 NOTE — Plan of Care (Signed)
  Problem: Education: Goal: Knowledge of General Education information will improve Description: Including pain rating scale, medication(s)/side effects and non-pharmacologic comfort measures Outcome: Progressing   Problem: Health Behavior/Discharge Planning: Goal: Ability to manage health-related needs will improve Outcome: Progressing   Problem: Clinical Measurements: Goal: Ability to maintain clinical measurements within normal limits will improve Outcome: Progressing Goal: Will remain free from infection Outcome: Progressing Goal: Diagnostic test results will improve Outcome: Progressing Goal: Respiratory complications will improve Outcome: Progressing Goal: Cardiovascular complication will be avoided Outcome: Progressing   Problem: Activity: Goal: Risk for activity intolerance will decrease Outcome: Progressing   Problem: Nutrition: Goal: Adequate nutrition will be maintained Outcome: Progressing   Problem: Coping: Goal: Level of anxiety will decrease Outcome: Progressing   Problem: Elimination: Goal: Will not experience complications related to bowel motility Outcome: Progressing Goal: Will not experience complications related to urinary retention Outcome: Progressing   Problem: Pain Managment: Goal: General experience of comfort will improve and/or be controlled Outcome: Progressing   Problem: Safety: Goal: Ability to remain free from injury will improve Outcome: Progressing   Problem: Skin Integrity: Goal: Risk for impaired skin integrity will decrease Outcome: Progressing   Problem: Education: Goal: Knowledge of disease or condition will improve Outcome: Progressing Goal: Understanding of medication regimen will improve Outcome: Progressing Goal: Individualized Educational Video(s) Outcome: Progressing   Problem: Activity: Goal: Ability to tolerate increased activity will improve Outcome: Progressing   Problem: Cardiac: Goal: Ability to achieve  and maintain adequate cardiopulmonary perfusion will improve Outcome: Progressing   Problem: Health Behavior/Discharge Planning: Goal: Ability to safely manage health-related needs after discharge will improve Outcome: Progressing   Problem: Activity: Goal: Ability to tolerate increased activity will improve Outcome: Progressing   Problem: Respiratory: Goal: Ability to maintain a clear airway and adequate ventilation will improve Outcome: Progressing   Problem: Role Relationship: Goal: Method of communication will improve Outcome: Progressing   Problem: Education: Goal: Ability to describe self-care measures that may prevent or decrease complications (Diabetes Survival Skills Education) will improve Outcome: Progressing Goal: Individualized Educational Video(s) Outcome: Progressing   Problem: Coping: Goal: Ability to adjust to condition or change in health will improve Outcome: Progressing   Problem: Fluid Volume: Goal: Ability to maintain a balanced intake and output will improve Outcome: Progressing   Problem: Health Behavior/Discharge Planning: Goal: Ability to identify and utilize available resources and services will improve Outcome: Progressing Goal: Ability to manage health-related needs will improve Outcome: Progressing   Problem: Metabolic: Goal: Ability to maintain appropriate glucose levels will improve Outcome: Progressing   Problem: Nutritional: Goal: Maintenance of adequate nutrition will improve Outcome: Progressing Goal: Progress toward achieving an optimal weight will improve Outcome: Progressing   Problem: Skin Integrity: Goal: Risk for impaired skin integrity will decrease Outcome: Progressing   Problem: Tissue Perfusion: Goal: Adequacy of tissue perfusion will improve Outcome: Progressing

## 2023-07-20 DIAGNOSIS — K922 Gastrointestinal hemorrhage, unspecified: Secondary | ICD-10-CM | POA: Diagnosis not present

## 2023-07-20 LAB — GLUCOSE, CAPILLARY
Glucose-Capillary: 118 mg/dL — ABNORMAL HIGH (ref 70–99)
Glucose-Capillary: 130 mg/dL — ABNORMAL HIGH (ref 70–99)
Glucose-Capillary: 131 mg/dL — ABNORMAL HIGH (ref 70–99)
Glucose-Capillary: 134 mg/dL — ABNORMAL HIGH (ref 70–99)
Glucose-Capillary: 136 mg/dL — ABNORMAL HIGH (ref 70–99)
Glucose-Capillary: 144 mg/dL — ABNORMAL HIGH (ref 70–99)
Glucose-Capillary: 164 mg/dL — ABNORMAL HIGH (ref 70–99)
Glucose-Capillary: 166 mg/dL — ABNORMAL HIGH (ref 70–99)

## 2023-07-20 LAB — CBC
HCT: 34.1 % — ABNORMAL LOW (ref 36.0–46.0)
Hemoglobin: 11.1 g/dL — ABNORMAL LOW (ref 12.0–15.0)
MCH: 32.4 pg (ref 26.0–34.0)
MCHC: 32.6 g/dL (ref 30.0–36.0)
MCV: 99.4 fL (ref 80.0–100.0)
Platelets: 469 10*3/uL — ABNORMAL HIGH (ref 150–400)
RBC: 3.43 MIL/uL — ABNORMAL LOW (ref 3.87–5.11)
RDW: 13.8 % (ref 11.5–15.5)
WBC: 9.8 10*3/uL (ref 4.0–10.5)
nRBC: 0 % (ref 0.0–0.2)

## 2023-07-20 LAB — HEPARIN LEVEL (UNFRACTIONATED): Heparin Unfractionated: 0.71 [IU]/mL — ABNORMAL HIGH (ref 0.30–0.70)

## 2023-07-20 LAB — APTT: aPTT: 66 s — ABNORMAL HIGH (ref 24–36)

## 2023-07-20 MED ORDER — SODIUM CHLORIDE 0.9 % IV SOLN: 2.0000 g | INTRAVENOUS | Status: DC

## 2023-07-20 NOTE — Plan of Care (Signed)
  Problem: Clinical Measurements: Goal: Diagnostic test results will improve Outcome: Not Progressing   Problem: Activity: Goal: Risk for activity intolerance will decrease Outcome: Not Progressing   Problem: Elimination: Goal: Will not experience complications related to urinary retention Outcome: Not Progressing   Problem: Cardiac: Goal: Ability to achieve and maintain adequate cardiopulmonary perfusion will improve Outcome: Not Progressing   Problem: Nutritional: Goal: Maintenance of adequate nutrition will improve Outcome: Not Progressing   Problem: Metabolic: Goal: Ability to maintain appropriate glucose levels will improve Outcome: Not Progressing

## 2023-07-20 NOTE — Progress Notes (Signed)
 Nutrition Follow-up  DOCUMENTATION CODES:  Not applicable  INTERVENTION:  Continue TF via Cortrak: Vital 1.5 at 52ml/hr ( per day) 60ml ProSource TF20 once daily TF at goal provides: 1700kcal, 93g protein and free water  daily  NUTRITION DIAGNOSIS:  Inadequate oral intake related to altered GI function (large hiatal hernia; cameron erosions) as evidenced by NPO status. - remains applicable  GOAL:  Patient will meet greater than or equal to 90% of their needs - goal met via TF  MONITOR:  Weight trends, Labs, TF tolerance, I & O's  REASON FOR ASSESSMENT:  Ventilator, Consult Enteral/tube feeding initiation and management  ASSESSMENT:  Pt admitted with coffee ground emesis. PMH significant for afib on eliquis , OSA, HTN, HLD, GERD and esophageal stricture.  4/15: admit with coffee ground emesis 4/16: developed afib w/RVR, concern for pulmonary edema, transfer to ICU; intubated 4/17: EGD- large hiatal hernia with oozing cameron erosions; large residue and coffee ground material in hiatal hernia 4/18: cortrak placed; tip in distal stomach past hiatal hernia 4/20: extubated  4/28: thoracentesis-  yield fluid 4/30: Transferred to floor  Plan for OR tomorrow with surgery team for hernia repair and G-tube placement.   Met with pt in room. Pt reports tolerating TF fairly well. TF at goal. Denies any vomiting, has occasional nausea from sitting up or using breathing treatment. RD explained holding TF at midnight for procedure tomorrow. Pt with no questions at this time.   After surgery consider transitioning to standard formula.   Admit weight: 103.7 kg Current weight: 97.5 kg (5/5)  Medications: Colace, Lasix , NovoLog  SSI, Melatonin, Protonix , Miralax , IV antibiotics  Labs reviewed.  CBG: 113-166 x 24 hrs   UOP: 1000 mL x 24 hrs  Diet Order:   Diet Order             Diet NPO time specified  Diet effective midnight                   EDUCATION NEEDS:   No education needs have been identified at this time  Skin:  Skin Assessment: Reviewed RN Assessment  Last BM:  5/5  Height:  Ht Readings from Last 1 Encounters:  07/02/23 5\' 10"  (1.778 m)   Weight:  Wt Readings from Last 1 Encounters:  07/19/23 97.5 kg   Ideal Body Weight:  68.2 kg  BMI:  Body mass index is 30.84 kg/m.  Estimated Nutritional Needs:  Kcal:  1700-1900 Protein:  85-100g Fluid:  >/=1.7L   Doneta Furbish RD, LDN Clinical Dietitian

## 2023-07-20 NOTE — Progress Notes (Signed)
 PROGRESS NOTE    Darlene [name]  ZOX:[id number] DOB: 21-May-1940 DOA: 06/29/2023 PCP: Jenelle Mis, FNP   Brief Narrative:  Darlene [name] is a 83 y.o. F with PMH significant for atrial fibrillation on eliquis , OSA on CPAP, HTN, HLD, GERD and esophageal strictures.  She presents to our facility with ongoing coffee-ground emesis on 06/29/2023.  CT abdomen pelvis with contrast not revealing of any acute source of bleeding although noted high-grade stenosis of the celiac axis and collateral flow from the superior mesenteric artery-patient previously scheduled for hiatal hernia and spigelian hernia repair with Dr. Jamse Mcgee later this month.  Initially admitted for EGD planned for 06/30/2023, overnight noted to transition to A-fib with RVR with profound hypoxia during the event with noted pulmonary edema and large volume aspiration -initially placed on BiPAP with worsening respiratory status ultimately intubated for airway protection.  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  Ultimately extubated 07/04/23.  ICU delirium improving over the past week, respiratory status improving, echo showing moderate to severe right ventricular dysfunction.  Patient required transient pressors in the interim likely in setting of sepsis due to large volume aspiration pneumonia.  Patient weaned to nasal cannula, off pressors now transition to p.o. midodrine  and otherwise stable for transfer to hospital floor and out of the ICU.  Hospital course 4/15 admit with coffee ground emesis for inpatient EGD 4/16 afib with RVR, concern for pulm edema, PCCM consult and ICU txfr 4/17: ETT air leak, fixed with more balloon inflation. On Precedex  and Fentanyl  drips for sedation. Upper endoscopy-->Cameron ulcers on hiatal hernia.  A-fib rate controlled on amiodarone . 4/21 Complaints of epigastric pain vs delirium overnight, still on amio gtt/heparin . Tube feeds paused this early this morning.    4/22 Delirium improving, patient had BM, Pulmonary Edema vs BL infiltrates on CXR-, CVP 18 -diuresed with lasix  and metolazone  4/24 Diuresed 3.5 L with metolazone /Lasix  4/26 Placed on BiPAP for increased WOB 4/27 Point-of-care echo showed moderate to severe RV dysfunction.  LV function appears normal.  There is consolidation at both bases with a trivial effusion unsuitable for thoracentesis.  Started on norepinephrine  to support blood pressure as patient required Precedex  for delirium, which exacerbated hypotension 4/28 thoracentesis. Right 100 ml. Exudate by lights. Likely parapneumonic  4/29 off norepi PCXR improved. WBC nml pct neg stopped abx. Feeling better  4/30 weaned down to 4L [place] - transition to TRH 5/01 to present: Continue to wean patient off oxygen as tolerated, PT/OT following, possible plan for hernia repair 07/21/23 pending OR availability. Transition off Eliquis  to heparin  preoperatively.  Assessment & Plan:   Principal Problem:   Acute GI bleeding Active Problems:   Intractable nausea and vomiting   Acute respiratory failure with hypoxia (HCC)   Aspiration into airway   Hiatal hernia   Acute pulmonary edema (HCC)   Acute hypoxic and hypercapnic respiratory failure Aspiration pneumonia in setting of large hiatal hernia Cannot rule out septic shock Complicated by volume overload/parapneumonic pleural effusion - Oxygen weaning appropriately, approaching euvolemia - Completed antibiotic course for aspiration pneumonia coverage - Tube feeds ongoing via core track, remains n.p.o. in the setting of large hiatal hernia and increased risk of aspiration and vomiting - PT OT following, out of bed to chair as tolerated  Acute versus subacute GI bleed Hiatal hernia with oozing Cameron erosions - Hemoglobin stable, hold Eliquis , transition to heparin  as above perioperatively -GI following EGD 4/17 confirms Cameron ulcers on hiatal hernia -  General Surgery following -plan for  hiatal hernia repair next week pending OR availability  Hypotension, improving Questionably iatrogenic versus septic shock given above Orthostatic hypotension ongoing -IV pressors, nor epi, off 4/ 29  -Continue midodrine  at 10mg  tid -attempt to wean as tolerated postoperatively, will hold any further titration preoperatively to avoid hypotension -Profoundly hypotensive 5/2 , 5/5 with positive orthostatics   A-fib with RVR, rate currently controlled On Eliquis  at baseline -Rate now controlled on p.o. amiodarone , off diltiazem  and beta-blocker given hypotension as above  - Eliquis  discontinued, currently on heparin  drip perioperatively   Severe deconditioning Ambulatory dysfunction PT OT following, discussed with family and staff to encourage ambulation, even short distances frequently throughout the day   Hyperglycemia without history of diabetes Continue sliding scale insulin , hypoglycemic protocol A1c previously within normal limits 5.6  DVT prophylaxis: Place and maintain sequential compression device Start: 07/01/23 0737 SCDs Start: 06/29/23 1509 Code Status:   Code Status: Full Code Family Communication: At bedside  Status is: Inpatient Dispo: The patient is from: Home              Anticipated d/c is to: To be determined              Anticipated d/c date is: 72+ hours              Patient currently not medically stable for discharge given ongoing need for surgical intervention, NG tube feeds and medical management  Consultants:  PCCM, GI, general surgery  Antimicrobials:  Previously completed  Subjective: No acute issues or events overnight, denies nausea vomiting diarrhea constipation headache fevers chills or chest pain.  No further episodes of orthostatic symptoms.  Objective: Vitals:   07/19/23 1432 07/19/23 1547 07/19/23 1943 07/20/23 0416  BP: (!) 104/58  105/65 110/61  Pulse: 95  99 88  Resp: 18  18 17   Temp: (!) 97.5 F (36.4 C)  98.8 F (37.1 C) 98 F  (36.7 C)  TempSrc:   Oral Oral  SpO2: 93%  92% 95%  Weight:  97.5 kg    Height:        Intake/Output Summary (Last 24 hours) at 07/20/2023 0744 Last data filed at 07/20/2023 0235 Gross per 24 hour  Intake 279.37 ml  Output 1000 ml  Net -720.63 ml   Filed Weights   07/05/23 0522 07/11/23 0500 07/19/23 1547  Weight: 98 kg 96.5 kg 97.5 kg    Examination:  General:  Pleasantly resting in bed, No acute distress. HEENT:  Normocephalic atraumatic.  NG tube feeds ongoing, nasal cannula noted. Neck:  Without mass or deformity.  Trachea is midline. Lungs: Scant bibasilar rales without overt wheeze or rhonchi. Heart: Irregularly irregular. Abdomen:  Soft, nontender, nondistended. Extremities: Without cyanosis, clubbing, edema, or obvious deformity.  Data Reviewed: I have personally reviewed following labs and imaging studies  CBC: Recent Labs  Lab 07/15/23 0315 07/18/23 0827 07/19/23 0450 07/20/23 0503  WBC 9.5 9.8 11.5* 9.8  HGB 10.3* 10.9* 11.3* 11.1*  HCT 32.5* 34.4* 35.1* 34.1*  MCV 99.7 99.1 98.6 99.4  PLT 465* 516* 525* 469*   Basic Metabolic Panel: Recent Labs  Lab 07/14/23 0327 07/15/23 0315 07/19/23 0450  NA 136 138 138  K 3.5 3.7 3.1*  CL 94* 98 96*  CO2 31 30 35*  GLUCOSE 146* 114* 133*  BUN 28* 27* 27*  CREATININE 0.69 0.69 0.70  CALCIUM 9.0 9.0 9.1  MG 2.1 2.1  --   PHOS  --  3.0  --  GFR: Estimated Creatinine Clearance: 67.4 mL/min (by C-G formula based on SCr of 0.7 mg/dL).  CBG: Recent Labs  Lab 07/19/23 1202 07/19/23 1544 07/19/23 1945 07/20/23 0011 07/20/23 0414  GLUCAP 133* 113* 153* 164* 134*   Sepsis Labs: Recent Labs  Lab 07/14/23 0327  PROCALCITON <0.10    Recent Results (from the past 240 hours)  Body fluid culture w Gram Stain     Status: None   Collection Time: 07/12/23  4:50 PM   Specimen: Pleura; Body Fluid  Result Value Ref Range Status   Specimen Description PLEURAL  Final   Special Requests RIGHT LUNG  Final    Gram Stain   Final    RARE WBC PRESENT, PREDOMINANTLY MONONUCLEAR NO ORGANISMS SEEN    Culture   Final    NO GROWTH 3 DAYS Performed at [facility], [address], [place], [place] [address]    Report Status 07/16/2023 FINAL  Final    Radiology Studies: No results found.  Scheduled Meds:  amiodarone   200 mg Per Tube Daily   busPIRone   5 mg Per Tube TID   Chlorhexidine  Gluconate Cloth  6 each Topical Q0600   docusate  100 mg Per Tube BID   escitalopram   20 mg Per Tube Daily   furosemide   40 mg Intravenous BID   insulin  aspart  0-20 Units Subcutaneous Q4H   lidocaine   1 patch Transdermal Q24H   melatonin  3 mg Per Tube QHS   midodrine   10 mg Per Tube Q8H   mouth rinse  15 mL Mouth Rinse 4 times per day   pantoprazole  (PROTONIX ) IV  40 mg Intravenous Q12H   polyethylene glycol  17 g Per Tube Daily   sodium chloride  flush  10-40 mL Intracatheter Q12H   Continuous Infusions:  feeding supplement (VITAL 1.5 CAL) 1,000 mL (07/20/23 0235)   heparin  1,450 Units/hr (07/20/23 0235)     LOS: 20 days   Time spent:  Haydee Lipa, DO [facility]  If 7PM-7AM, please contact night-coverage [email or web address]  07/20/2023, 7:44 AM

## 2023-07-20 NOTE — Progress Notes (Signed)
 19 Days Post-Op   Subjective/Chief Complaint: Did not get OOB yesterday. Denies pain at present. Reports mild intermittent nausea without vomiting. Having BMs. Foley in place.  Objective: Vital signs in last 24 hours: Temp:  [97.4 F (36.3 C)-98.8 F (37.1 C)] 97.4 F (36.3 C) (05/06 0700) Pulse Rate:  [73-99] 73 (05/06 0700) Resp:  [17-18] 17 (05/06 0700) BP: (104-113)/(58-65) 113/63 (05/06 0700) SpO2:  [92 %-95 %] 93 % (05/06 0700) Weight:  [97.5 kg] 97.5 kg (05/05 1547) Last BM Date : 07/19/23  Intake/Output from previous day: 05/05 0701 - 05/06 0700 In: 343.1 [I.V.:343.1] Out: 1000 [Urine:1000] Intake/Output this shift: No intake/output data recorded.  Alert, NAD, frail Cortrak in place Unlabored respirations on room air Abdomen is soft, nondistended, and nontender GU: foley in place draining dark urine   Lab Results:  Recent Labs    07/19/23 0450 07/20/23 0503  WBC 11.5* 9.8  HGB 11.3* 11.1*  HCT 35.1* 34.1*  PLT 525* 469*   BMET Recent Labs    07/19/23 0450  NA 138  K 3.1*  CL 96*  CO2 35*  GLUCOSE 133*  BUN 27*  CREATININE 0.70  CALCIUM 9.1   PT/INR No results for input(s): "LABPROT", "INR" in the last 72 hours. ABG No results for input(s): "PHART", "HCO3" in the last 72 hours.  Invalid input(s): "PCO2", "PO2"  Studies/Results: No results found.   Anti-infectives: Anti-infectives (From admission, onward)    Start     Dose/Rate Route Frequency Ordered Stop   07/09/23 0945  piperacillin -tazobactam (ZOSYN ) IVPB 3.375 g  Status:  Discontinued        3.375 g 12.5 mL/hr over 240 Minutes Intravenous Every 8 hours 07/09/23 0845 07/13/23 0842   07/05/23 0915  cefTRIAXone  (ROCEPHIN ) 2 g in sodium chloride  0.9 % 100 mL IVPB  Status:  Discontinued        2 g 200 mL/hr over 30 Minutes Intravenous Every 24 hours 07/05/23 0815 07/09/23 0845   06/30/23 2100  piperacillin -tazobactam (ZOSYN ) IVPB 3.375 g       Placed in "Followed by" Linked Group    3.375 g 12.5 mL/hr over 240 Minutes Intravenous Every 8 hours 06/30/23 1356 07/03/23 0016   06/30/23 1445  piperacillin -tazobactam (ZOSYN ) IVPB 3.375 g       Placed in "Followed by" Linked Group   3.375 g 100 mL/hr over 30 Minutes Intravenous  Once 06/30/23 1356 06/30/23 1626       Assessment/Plan:  Hiatal hernia with oozing Cameron erosions leaking to UGIB Spigelian hernia containing ascending colon  - will plan operative repair of at least [facility] this admission -currently scheduled for tomorrow 5/7 at 08:30 with Dr. Lanell Pinta. I briefly reviewed the surgical plan again with patient today. - heparin  gtt - ordered to stop at 0200 5/7 - Continue tube feeds via Cortrak - ordered to stop at [facility] - Surgery will follow, plan for heparin /tube feeding was discussed with patients day-shift RN face to face   I reviewed last 24 h vitals and pain scores, last 48 h intake and output, last 24 h labs and trends, last 24 h imaging results, and PCCM notes .   This care required moderate level of medical decision making.    LOS: 20 days    Charlott Converse 07/20/2023

## 2023-07-20 NOTE — Anesthesia Preprocedure Evaluation (Signed)
 Anesthesia Evaluation  Patient identified by MRN, date of birth, ID band Patient awake    Reviewed: Allergy & Precautions, NPO status , Patient's Chart, lab work & pertinent test results, reviewed documented beta blocker date and time   Airway Mallampati: III  TM Distance: >3 FB Neck ROM: Full    Dental  (+) Teeth Intact, Dental Advisory Given   Pulmonary sleep apnea    Pulmonary exam normal breath sounds clear to auscultation       Cardiovascular hypertension, Pt. on home beta blockers and Pt. on medications + dysrhythmias Atrial Fibrillation  Rhythm:Irregular Rate:Abnormal  Echo 06/30/23: 1. Left ventricular ejection fraction, by estimation, is 50 to 55%. The  left ventricle has low normal function. The left ventricle demonstrates  global hypokinesis. There is mild concentric left ventricular hypertrophy.  Left ventricular diastolic  function could not be evaluated.   2. Akiniesis of the RV free wall with tethering of the apex consistent  with McConnell's sign and acute RV dsyfunction. Right ventricular systolic  function is mildly reduced. The right ventricular size is mildly enlarged.  There is moderately elevated  pulmonary artery systolic pressure.   3. Left atrial size was moderately dilated.   4. A small pericardial effusion is present.   5. The mitral valve is normal in structure. Trivial mitral valve  regurgitation.   6. Tricuspid valve regurgitation is moderate.   7. The aortic valve is tricuspid. Aortic valve regurgitation is trivial.  Aortic valve sclerosis is present, with no evidence of aortic valve  stenosis.   8. The inferior vena cava is dilated in size with <50% respiratory  variability, suggesting right atrial pressure of 15 mmHg.     Neuro/Psych  PSYCHIATRIC DISORDERS Anxiety Depression    negative neurological ROS     GI/Hepatic Neg liver ROS, hiatal hernia,GERD  Medicated,,Ventral hernia without  obstruction or gangrene   Endo/Other  negative endocrine ROS  Obesity   Renal/GU negative Renal ROS     Musculoskeletal  (+) Arthritis ,    Abdominal   Peds  Hematology  (+) Blood dyscrasia (Eliquis )   Anesthesia Other Findings   Reproductive/Obstetrics                             Anesthesia Physical Anesthesia Plan  ASA: 3  Anesthesia Plan: General   Post-op Pain Management: Tylenol  PO (pre-op)*   Induction: Intravenous  PONV Risk Score and Plan: 4 or greater and Dexamethasone , Ondansetron  and Treatment may vary due to age or medical condition  Airway Management Planned: Oral ETT  Additional Equipment: ClearSight  Intra-op Plan:   Post-operative Plan: Extubation in OR  Informed Consent: I have reviewed the patients History and Physical, chart, labs and discussed the procedure including the risks, benefits and alternatives for the proposed anesthesia with the patient or authorized representative who has indicated his/her understanding and acceptance.     Dental advisory given  Plan Discussed with: CRNA  Anesthesia Plan Comments: (2nd PIV after induction )        Anesthesia Quick Evaluation

## 2023-07-20 NOTE — H&P (View-Only) (Signed)
 19 Days Post-Op   Subjective/Chief Complaint: Did not get OOB yesterday. Denies pain at present. Reports mild intermittent nausea without vomiting. Having BMs. Foley in place.  Objective: Vital signs in last 24 hours: Temp:  [97.4 F (36.3 C)-98.8 F (37.1 C)] 97.4 F (36.3 C) (05/06 0700) Pulse Rate:  [73-99] 73 (05/06 0700) Resp:  [17-18] 17 (05/06 0700) BP: (104-113)/(58-65) 113/63 (05/06 0700) SpO2:  [92 %-95 %] 93 % (05/06 0700) Weight:  [97.5 kg] 97.5 kg (05/05 1547) Last BM Date : 07/19/23  Intake/Output from previous day: 05/05 0701 - 05/06 0700 In: 343.1 [I.V.:343.1] Out: 1000 [Urine:1000] Intake/Output this shift: No intake/output data recorded.  Alert, NAD, frail Cortrak in place Unlabored respirations on room air Abdomen is soft, nondistended, and nontender GU: foley in place draining dark urine   Lab Results:  Recent Labs    07/19/23 0450 07/20/23 0503  WBC 11.5* 9.8  HGB 11.3* 11.1*  HCT 35.1* 34.1*  PLT 525* 469*   BMET Recent Labs    07/19/23 0450  NA 138  K 3.1*  CL 96*  CO2 35*  GLUCOSE 133*  BUN 27*  CREATININE 0.70  CALCIUM 9.1   PT/INR No results for input(s): "LABPROT", "INR" in the last 72 hours. ABG No results for input(s): "PHART", "HCO3" in the last 72 hours.  Invalid input(s): "PCO2", "PO2"  Studies/Results: No results found.   Anti-infectives: Anti-infectives (From admission, onward)    Start     Dose/Rate Route Frequency Ordered Stop   07/09/23 0945  piperacillin -tazobactam (ZOSYN ) IVPB 3.375 g  Status:  Discontinued        3.375 g 12.5 mL/hr over 240 Minutes Intravenous Every 8 hours 07/09/23 0845 07/13/23 0842   07/05/23 0915  cefTRIAXone  (ROCEPHIN ) 2 g in sodium chloride  0.9 % 100 mL IVPB  Status:  Discontinued        2 g 200 mL/hr over 30 Minutes Intravenous Every 24 hours 07/05/23 0815 07/09/23 0845   06/30/23 2100  piperacillin -tazobactam (ZOSYN ) IVPB 3.375 g       Placed in "Followed by" Linked Group    3.375 g 12.5 mL/hr over 240 Minutes Intravenous Every 8 hours 06/30/23 1356 07/03/23 0016   06/30/23 1445  piperacillin -tazobactam (ZOSYN ) IVPB 3.375 g       Placed in "Followed by" Linked Group   3.375 g 100 mL/hr over 30 Minutes Intravenous  Once 06/30/23 1356 06/30/23 1626       Assessment/Plan:  Hiatal hernia with oozing Cameron erosions leaking to UGIB Spigelian hernia containing ascending colon  - will plan operative repair of at least [facility] this admission -currently scheduled for tomorrow 5/7 at 08:30 with Dr. Lanell Pinta. I briefly reviewed the surgical plan again with patient today. - heparin  gtt - ordered to stop at 0200 5/7 - Continue tube feeds via Cortrak - ordered to stop at [facility] - Surgery will follow, plan for heparin /tube feeding was discussed with patients day-shift RN face to face   I reviewed last 24 h vitals and pain scores, last 48 h intake and output, last 24 h labs and trends, last 24 h imaging results, and PCCM notes .   This care required moderate level of medical decision making.    LOS: 20 days    Charlott Converse 07/20/2023

## 2023-07-20 NOTE — TOC Progression Note (Signed)
 Transition of Care [facility]) - Progression Note    Patient Details  Name: Darlene [name] MRN: [id number] Date of Birth: 1940-06-20  Transition of Care [facility]) CM/SW Contact  Katrinka Parr, [place] Phone Number: 07/20/2023, 10:56 AM  Clinical Narrative:     TOC will continue to follow. Awaiting eval after surgery to determine disposition.    Expected Discharge Plan:  (TBD) Barriers to Discharge: Continued Medical Work up  Expected Discharge Plan and Services In-house Referral: Clinical Social Work   Post Acute Care Choice:  (TBD) Living arrangements for the past 2 months: Single Family Home                                       Social Determinants of Health (SDOH) Interventions SDOH Screenings   Food Insecurity: No Food Insecurity (06/29/2023)  Housing: Low Risk  (06/29/2023)  Transportation Needs: No Transportation Needs (06/29/2023)  Utilities: Not At Risk (06/29/2023)  Alcohol Screen: Low Risk  (04/28/2019)  Depression (PHQ2-9): Medium Risk (06/15/2023)  Financial Resource Strain: Low Risk  (02/03/2021)  Physical Activity: Inactive (04/28/2019)  Social Connections: Moderately Isolated (06/29/2023)  Stress: No Stress Concern Present (04/28/2019)  Tobacco Use: Low Risk  (06/30/2023)    Readmission Risk Interventions     No data to display

## 2023-07-20 NOTE — Progress Notes (Signed)
 PHARMACY - ANTICOAGULATION Pharmacy Consult for heparin   Indication: atrial fibrillation  Allergies  Allergen Reactions   Cephalosporins Rash    Tolerated ceftriaxone  06/2023   Keflex [Cephalexin] Rash    Patient Measurements: Height: 5\' 10"  (177.8 cm) Weight: 97.5 kg (214 lb 15.2 oz) IBW/kg (Calculated) : 68.5 HEPARIN  DW (KG): 91  Vital Signs: Temp: 98 F (36.7 C) (05/06 0416) Temp Source: Oral (05/06 0416) BP: 110/61 (05/06 0416) Pulse Rate: 88 (05/06 0416)  Labs: Recent Labs    07/18/23 0827 07/18/23 0828 07/19/23 0450 07/19/23 1514 07/20/23 0503  HGB 10.9*  --  11.3*  --  11.1*  HCT 34.4*  --  35.1*  --  34.1*  PLT 516*  --  525*  --  469*  APTT  --  85* 115* 81* 66*  HEPARINUNFRC  --  >1.10* >1.10*  --  0.71*  CREATININE  --   --  0.70  --   --     Estimated Creatinine Clearance: 67.4 mL/min (by C-G formula based on SCr of 0.7 mg/dL).   Assessment: 83 YO female with PMH significant for atrial fibrillation on Eliquis  admitted for hiatal hernia with oozing erosions leaking to UGIB. Plan operative repair currently scheduled for 5/07. Apixaban  now held, last dose 5/01 @ 2200. Pharmacy consulted to initiate IV heparin  in interim.   Using aPTT for rate adjustments, heparin  levels still falsely elevated due to recent apixaban  use. aPTT low therapeutic range. CBC stable with no reports of bleeding or issues with infusion.  Goal of Therapy:  Heparin  level 0.3-0.7 units/ml aPTT 66-102 seconds Monitor platelets by anticoagulation protocol: Yes   Plan:  Increase heparin  infusion empirically to units/hr Check heparin  level daily while on heparin  Continue to monitor H&H and platelets F/u plans to transition back to apixaban   Thank you for allowing pharmacy to be a part of this patient's care.  Claudia Cuff, PharmD, BCPS Clinical Pharmacist

## 2023-07-21 ENCOUNTER — Encounter (HOSPITAL_COMMUNITY)
Admission: EM | Disposition: A | Discharge status: Skilled Nursing Facility | Payer: Self-pay | Source: Home / Self Care | Attending: Internal Medicine

## 2023-07-21 ENCOUNTER — Other Ambulatory Visit: Payer: Self-pay

## 2023-07-21 ENCOUNTER — Ambulatory Visit: Admit: 2023-07-21 | Admitting: Surgery

## 2023-07-21 ENCOUNTER — Inpatient Hospital Stay (HOSPITAL_COMMUNITY): Admitting: Certified Registered Nurse Anesthetist

## 2023-07-21 ENCOUNTER — Encounter (HOSPITAL_COMMUNITY): Payer: Self-pay | Admitting: Family Medicine

## 2023-07-21 DIAGNOSIS — K922 Gastrointestinal hemorrhage, unspecified: Secondary | ICD-10-CM | POA: Diagnosis not present

## 2023-07-21 DIAGNOSIS — K439 Ventral hernia without obstruction or gangrene: Secondary | ICD-10-CM

## 2023-07-21 DIAGNOSIS — G4733 Obstructive sleep apnea (adult) (pediatric): Secondary | ICD-10-CM | POA: Diagnosis not present

## 2023-07-21 DIAGNOSIS — I4891 Unspecified atrial fibrillation: Secondary | ICD-10-CM | POA: Diagnosis not present

## 2023-07-21 DIAGNOSIS — K449 Diaphragmatic hernia without obstruction or gangrene: Secondary | ICD-10-CM

## 2023-07-21 DIAGNOSIS — J810 Acute pulmonary edema: Secondary | ICD-10-CM | POA: Diagnosis not present

## 2023-07-21 DIAGNOSIS — J9601 Acute respiratory failure with hypoxia: Secondary | ICD-10-CM | POA: Diagnosis not present

## 2023-07-21 HISTORY — PX: INSERTION, GASTROSTOMY TUBE, ROBOT-ASSISTED: SHX7600

## 2023-07-21 HISTORY — PX: XI ROBOTIC ASSISTED PARAESOPHAGEAL HERNIA REPAIR: SHX6871

## 2023-07-21 LAB — CBC
HCT: 36 % (ref 36.0–46.0)
Hemoglobin: 11.4 g/dL — ABNORMAL LOW (ref 12.0–15.0)
MCH: 31.2 pg (ref 26.0–34.0)
MCHC: 31.7 g/dL (ref 30.0–36.0)
MCV: 98.6 fL (ref 80.0–100.0)
Platelets: 445 10*3/uL — ABNORMAL HIGH (ref 150–400)
RBC: 3.65 MIL/uL — ABNORMAL LOW (ref 3.87–5.11)
RDW: 13.6 % (ref 11.5–15.5)
WBC: 10.7 10*3/uL — ABNORMAL HIGH (ref 4.0–10.5)
nRBC: 0 % (ref 0.0–0.2)

## 2023-07-21 LAB — GLUCOSE, CAPILLARY
Glucose-Capillary: 116 mg/dL — ABNORMAL HIGH (ref 70–99)
Glucose-Capillary: 126 mg/dL — ABNORMAL HIGH (ref 70–99)
Glucose-Capillary: 145 mg/dL — ABNORMAL HIGH (ref 70–99)
Glucose-Capillary: 144 mg/dL — ABNORMAL HIGH (ref 70–99)
Glucose-Capillary: 103 mg/dL — ABNORMAL HIGH (ref 70–99)

## 2023-07-21 LAB — TYPE AND SCREEN
ABO/RH(D): A POS
Antibody Screen: NEGATIVE

## 2023-07-21 LAB — APTT: aPTT: 37 s — ABNORMAL HIGH (ref 24–36)

## 2023-07-21 LAB — HEPARIN LEVEL (UNFRACTIONATED): Heparin Unfractionated: 0.21 [IU]/mL — ABNORMAL LOW (ref 0.30–0.70)

## 2023-07-21 SURGERY — REPAIR, HERNIA, PARAESOPHAGEAL, ROBOT-ASSISTED
Anesthesia: General | Site: Abdomen

## 2023-07-21 MED ORDER — PHENYLEPHRINE 80 MCG/ML (10ML) SYRINGE FOR IV PUSH (FOR BLOOD PRESSURE SUPPORT)
PREFILLED_SYRINGE | INTRAVENOUS | Status: DC | PRN
  Administered 2023-07-21: 180 ug via INTRAVENOUS
  Administered 2023-07-21 (×2): 240 ug via INTRAVENOUS
  Administered 2023-07-21: 80 ug via INTRAVENOUS

## 2023-07-21 MED ORDER — ALBUMIN HUMAN 5 % IV SOLN: INTRAVENOUS | Status: DC | PRN

## 2023-07-21 MED ORDER — DEXAMETHASONE SODIUM PHOSPHATE 10 MG/ML IJ SOLN
INTRAMUSCULAR | Status: AC
  Filled 2023-07-21: qty 1

## 2023-07-21 MED ORDER — METHOCARBAMOL 1000 MG/10ML IJ SOLN
500.0000 mg | Freq: Four times a day (QID) | INTRAMUSCULAR | Status: DC | PRN
  Administered 2023-07-21 – 2023-07-22 (×2): 500 mg via INTRAVENOUS
  Filled 2023-07-21 (×2): qty 10

## 2023-07-21 MED ORDER — PHENYLEPHRINE HCL-NACL 20-0.9 MG/250ML-% IV SOLN
INTRAVENOUS | Status: DC | PRN
Start: 2023-07-21 — End: 2023-07-21
  Administered 2023-07-21: 50 ug/min via INTRAVENOUS

## 2023-07-21 MED ORDER — ACETAMINOPHEN 10 MG/ML IV SOLN
INTRAVENOUS | Status: AC
  Filled 2023-07-21: qty 100

## 2023-07-21 MED ORDER — FENTANYL CITRATE (PF) 100 MCG/2ML IJ SOLN: 25.0000 ug | INTRAMUSCULAR | Status: DC | PRN

## 2023-07-21 MED ORDER — PROPOFOL 10 MG/ML IV BOLUS
INTRAVENOUS | Status: DC | PRN
  Administered 2023-07-21: 100 mg via INTRAVENOUS

## 2023-07-21 MED ORDER — EPHEDRINE SULFATE-NACL 50-0.9 MG/10ML-% IV SOSY: PREFILLED_SYRINGE | INTRAVENOUS | Status: DC | PRN

## 2023-07-21 MED ORDER — ONDANSETRON HCL 4 MG/2ML IJ SOLN: 4.0000 mg | Freq: Once | INTRAMUSCULAR | Status: DC | PRN

## 2023-07-21 MED ORDER — ONDANSETRON HCL 4 MG/2ML IJ SOLN
INTRAMUSCULAR | Status: AC
  Filled 2023-07-21: qty 2

## 2023-07-21 MED ORDER — LIDOCAINE 2% (20 MG/ML) 5 ML SYRINGE
INTRAMUSCULAR | Status: AC
  Filled 2023-07-21: qty 5

## 2023-07-21 MED ORDER — HEPARIN (PORCINE) 25000 UT/250ML-% IV SOLN
1650.0000 [IU]/h | INTRAVENOUS | Status: DC
  Administered 2023-07-21 – 2023-07-22 (×2): 1500 [IU]/h via INTRAVENOUS
  Administered 2023-07-23 – 2023-07-24 (×3): 1750 [IU]/h via INTRAVENOUS
  Filled 2023-07-21 (×6): qty 250

## 2023-07-21 MED ORDER — ORAL CARE MOUTH RINSE: 15.0000 mL | Freq: Once | OROMUCOSAL | Status: AC

## 2023-07-21 MED ORDER — LACTATED RINGERS IV SOLN: INTRAVENOUS | Status: DC | PRN

## 2023-07-21 MED ORDER — PROPOFOL 10 MG/ML IV BOLUS
INTRAVENOUS | Status: AC
Start: 2023-07-21 — End: ?
  Filled 2023-07-21: qty 20

## 2023-07-21 MED ORDER — ROCURONIUM BROMIDE 10 MG/ML (PF) SYRINGE
PREFILLED_SYRINGE | INTRAVENOUS | Status: DC | PRN
  Administered 2023-07-21: 20 mg via INTRAVENOUS
  Administered 2023-07-21: 160 mg via INTRAVENOUS
  Administered 2023-07-21 (×2): 20 mg via INTRAVENOUS

## 2023-07-21 MED ORDER — DILTIAZEM HCL-DEXTROSE 125-5 MG/125ML-% IV SOLN (PREMIX)
5.0000 mg/h | INTRAVENOUS | Status: DC
  Administered 2023-07-21: 5 mg/h via INTRAVENOUS
  Administered 2023-07-21: 10 mg via INTRAVENOUS
  Filled 2023-07-21: qty 125

## 2023-07-21 MED ORDER — LACTATED RINGERS IV SOLN: INTRAVENOUS | Status: DC

## 2023-07-21 MED ORDER — VITAL 1.5 CAL PO LIQD
1000.0000 mL | ORAL | Status: DC
  Administered 2023-07-21: 1000 mL
  Filled 2023-07-21 (×3): qty 1000

## 2023-07-21 MED ORDER — METOPROLOL TARTRATE 5 MG/5ML IV SOLN
INTRAVENOUS | Status: DC | PRN
  Administered 2023-07-21: 1 mg via INTRAVENOUS
  Administered 2023-07-21: 3 mg via INTRAVENOUS
  Administered 2023-07-21: 2 mg via INTRAVENOUS
  Administered 2023-07-21 (×2): 1 mg via INTRAVENOUS

## 2023-07-21 MED ORDER — LIDOCAINE 2% (20 MG/ML) 5 ML SYRINGE
INTRAMUSCULAR | Status: DC | PRN
  Administered 2023-07-21: 100 mg via INTRAVENOUS

## 2023-07-21 MED ORDER — FENTANYL CITRATE (PF) 250 MCG/5ML IJ SOLN
INTRAMUSCULAR | Status: DC | PRN
  Administered 2023-07-21: 25 ug via INTRAVENOUS
  Administered 2023-07-21: 100 ug via INTRAVENOUS

## 2023-07-21 MED ORDER — PHENYLEPHRINE 80 MCG/ML (10ML) SYRINGE FOR IV PUSH (FOR BLOOD PRESSURE SUPPORT)
PREFILLED_SYRINGE | INTRAVENOUS | Status: AC
  Filled 2023-07-21: qty 10

## 2023-07-21 MED ORDER — BUPIVACAINE LIPOSOME 1.3 % IJ SUSP
INTRAMUSCULAR | Status: AC
  Filled 2023-07-21: qty 20

## 2023-07-21 MED ORDER — DEXAMETHASONE SODIUM PHOSPHATE 10 MG/ML IJ SOLN
INTRAMUSCULAR | Status: DC | PRN
  Administered 2023-07-21: 10 mg via INTRAVENOUS

## 2023-07-21 MED ORDER — SODIUM CHLORIDE (PF) 0.9 % IJ SOLN
INTRAMUSCULAR | Status: DC | PRN
  Administered 2023-07-21: 40 mL

## 2023-07-21 MED ORDER — ACETAMINOPHEN 10 MG/ML IV SOLN
INTRAVENOUS | Status: DC | PRN
  Administered 2023-07-21: 1000 mg via INTRAVENOUS

## 2023-07-21 MED ORDER — PHENYLEPHRINE HCL-NACL 20-0.9 MG/250ML-% IV SOLN
INTRAVENOUS | Status: AC
  Filled 2023-07-21: qty 250

## 2023-07-21 MED ORDER — METOPROLOL TARTRATE 5 MG/5ML IV SOLN
INTRAVENOUS | Status: AC
  Filled 2023-07-21: qty 10

## 2023-07-21 MED ORDER — ROCURONIUM BROMIDE 10 MG/ML (PF) SYRINGE
PREFILLED_SYRINGE | INTRAVENOUS | Status: AC
Start: 2023-07-21 — End: ?
  Filled 2023-07-21: qty 10

## 2023-07-21 MED ORDER — VANCOMYCIN HCL 1000 MG IV SOLR
INTRAVENOUS | Status: AC
  Filled 2023-07-21: qty 20

## 2023-07-21 MED ORDER — VANCOMYCIN HCL 1000 MG IV SOLR
INTRAVENOUS | Status: DC | PRN
  Administered 2023-07-21: 1000 mg via INTRAVENOUS

## 2023-07-21 MED ORDER — BUPIVACAINE-EPINEPHRINE (PF) 0.25% -1:200000 IJ SOLN
INTRAMUSCULAR | Status: AC
  Filled 2023-07-21: qty 30

## 2023-07-21 MED ORDER — INSULIN ASPART 100 UNIT/ML IJ SOLN: 0.0000 [IU] | INTRAMUSCULAR | Status: DC | PRN

## 2023-07-21 MED ORDER — BUPIVACAINE-EPINEPHRINE 0.25% -1:200000 IJ SOLN
INTRAMUSCULAR | Status: DC | PRN
  Administered 2023-07-21: 10.5 mL

## 2023-07-21 MED ORDER — SODIUM CHLORIDE (PF) 0.9 % IJ SOLN
INTRAMUSCULAR | Status: AC
  Filled 2023-07-21: qty 20

## 2023-07-21 MED ORDER — STERILE WATER FOR INJECTION IJ SOLN
INTRAMUSCULAR | Status: AC
  Filled 2023-07-21: qty 10

## 2023-07-21 MED ORDER — SODIUM CHLORIDE 0.9 % IR SOLN
Status: DC | PRN
Start: 2023-07-21 — End: 2023-07-21

## 2023-07-21 MED ORDER — ONDANSETRON HCL 4 MG/2ML IJ SOLN
INTRAMUSCULAR | Status: DC | PRN
  Administered 2023-07-21: 4 mg via INTRAVENOUS

## 2023-07-21 MED ORDER — ACETAMINOPHEN 500 MG PO TABS: 1000.0000 mg | ORAL_TABLET | Freq: Once | ORAL | Status: DC

## 2023-07-21 MED ORDER — ACETAMINOPHEN 160 MG/5ML PO SOLN
650.0000 mg | Freq: Four times a day (QID) | ORAL | Status: DC
  Administered 2023-07-21 – 2023-07-26 (×16): 650 mg
  Filled 2023-07-21 (×22): qty 20.3

## 2023-07-21 MED ORDER — 0.9 % SODIUM CHLORIDE (POUR BTL) OPTIME
TOPICAL | Status: DC | PRN
  Administered 2023-07-21: 1000 mL

## 2023-07-21 MED ORDER — FENTANYL CITRATE (PF) 250 MCG/5ML IJ SOLN
INTRAMUSCULAR | Status: AC
  Filled 2023-07-21: qty 5

## 2023-07-21 MED ORDER — SUGAMMADEX SODIUM 200 MG/2ML IV SOLN
INTRAVENOUS | Status: DC | PRN
  Administered 2023-07-21: 200 mg via INTRAVENOUS

## 2023-07-21 MED ORDER — METOCLOPRAMIDE HCL 5 MG/ML IJ SOLN
10.0000 mg | Freq: Four times a day (QID) | INTRAMUSCULAR | Status: DC
  Administered 2023-07-21 – 2023-07-26 (×18): 10 mg via INTRAVENOUS
  Filled 2023-07-21 (×19): qty 2

## 2023-07-21 MED ORDER — FENTANYL CITRATE PF 50 MCG/ML IJ SOSY: 12.5000 ug | PREFILLED_SYRINGE | INTRAMUSCULAR | Status: DC | PRN

## 2023-07-21 MED ORDER — CHLORHEXIDINE GLUCONATE 0.12 % MT SOLN
15.0000 mL | Freq: Once | OROMUCOSAL | Status: AC
  Administered 2023-07-21: 15 mL via OROMUCOSAL

## 2023-07-21 SURGICAL SUPPLY — 62 items
CANNULA REDUCER 12-8 DVNC XI (CANNULA) ×2 IMPLANT
CHLORAPREP W/TINT 26 (MISCELLANEOUS) ×2 IMPLANT
CLIP APPLIE 5 13 M/L LIGAMAX5 (MISCELLANEOUS) IMPLANT
COVER MAYO STAND STRL (DRAPES) ×2 IMPLANT
COVER SURGICAL LIGHT HANDLE (MISCELLANEOUS) ×2 IMPLANT
DEFOGGER SCOPE WARM SEASHARP (MISCELLANEOUS) ×2 IMPLANT
DERMABOND ADVANCED .7 DNX12 (GAUZE/BANDAGES/DRESSINGS) ×2 IMPLANT
DEVICE TROCAR PUNCTURE CLOSURE (ENDOMECHANICALS) ×2 IMPLANT
DRAIN PENROSE 0.5X18 (DRAIN) IMPLANT
DRAPE ARM DVNC X/XI (DISPOSABLE) ×8 IMPLANT
DRAPE CARDIOVASC SPLIT 88X140 (DRAPES) ×2 IMPLANT
DRAPE COLUMN DVNC XI (DISPOSABLE) ×2 IMPLANT
DRAPE SURG ORHT 6 SPLT 77X108 (DRAPES) ×2 IMPLANT
DRIVER NDL LRG 8 DVNC XI (INSTRUMENTS) ×2 IMPLANT
DRIVER NDL MEGA SUTCUT DVNCXI (INSTRUMENTS) ×2 IMPLANT
DRIVER NDLE LRG 8 DVNC XI (INSTRUMENTS) IMPLANT
DRIVER NDLE MEGA SUTCUT DVNCXI (INSTRUMENTS) ×2 IMPLANT
ELECTRODE REM PT RTRN 9FT ADLT (ELECTROSURGICAL) ×2 IMPLANT
ENDOLOOP SUT PDS II 0 18 (SUTURE) IMPLANT
G-TUBE MIC BOLUS 20FR ENFIT (TUBING) IMPLANT
GLOVE BIO SURGEON STRL SZ 6 (GLOVE) ×2 IMPLANT
GLOVE BIO SURGEON STRL SZ7.5 (GLOVE) ×10 IMPLANT
GLOVE INDICATOR 6.5 STRL GRN (GLOVE) ×2 IMPLANT
GOWN STRL REUS W/ TWL LRG LVL3 (GOWN DISPOSABLE) ×2 IMPLANT
GOWN STRL REUS W/TWL 2XL LVL3 (GOWN DISPOSABLE) ×2 IMPLANT
GRASPER SUT TROCAR 14GX15 (MISCELLANEOUS) IMPLANT
GRASPER TIP-UP FEN DVNC XI (INSTRUMENTS) ×2 IMPLANT
IRRIGATION SUCT STRKRFLW 2 WTP (MISCELLANEOUS) ×2 IMPLANT
KIT BASIN OR (CUSTOM PROCEDURE TRAY) ×2 IMPLANT
KIT TURNOVER KIT B (KITS) IMPLANT
MARKER SKIN DUAL TIP RULER LAB (MISCELLANEOUS) ×2 IMPLANT
NDL 22X1.5 STRL (OR ONLY) (MISCELLANEOUS) ×2 IMPLANT
NDL INSUFFLATION 14GA 120MM (NEEDLE) ×2 IMPLANT
NEEDLE 22X1.5 STRL (OR ONLY) (MISCELLANEOUS) ×2 IMPLANT
NEEDLE INSUFFLATION 14GA 120MM (NEEDLE) ×2 IMPLANT
OBTURATOR OPTICALSTD 8 DVNC (TROCAR) IMPLANT
PAD POSITIONING PINK XL (MISCELLANEOUS) ×2 IMPLANT
PENCIL SMOKE EVACUATOR (MISCELLANEOUS) IMPLANT
RETRACTOR GRSP SML 8 DVNC XI (INSTRUMENTS) ×2 IMPLANT
SCISSORS LAP 5X35 DISP (ENDOMECHANICALS) IMPLANT
SEAL UNIV 5-12 XI (MISCELLANEOUS) ×8 IMPLANT
SEALER VESSEL EXT DVNC XI (MISCELLANEOUS) ×2 IMPLANT
SET TUBE SMOKE EVAC HIGH FLOW (TUBING) ×2 IMPLANT
SPIKE FLUID TRANSFER (MISCELLANEOUS) ×2 IMPLANT
STAPLER VISISTAT 35W (STAPLE) IMPLANT
STOPCOCK 4 WAY LG BORE MALE ST (IV SETS) ×2 IMPLANT
SUT ETHIBOND 0 36 GRN (SUTURE) ×4 IMPLANT
SUT ETHIBOND 2 0 SH 36X2 (SUTURE) IMPLANT
SUT ETHILON 2 0 FS 18 (SUTURE) IMPLANT
SUT MNCRL AB 4-0 PS2 18 (SUTURE) ×2 IMPLANT
SUT SILK 0 SH 30 (SUTURE) IMPLANT
SUT SILK 2 0 SH (SUTURE) IMPLANT
SUT VICRYL 0 UR6 27IN ABS (SUTURE) ×2 IMPLANT
SYR 30ML SLIP (SYRINGE) ×2 IMPLANT
TIP INNERVISION DETACH 40FR (MISCELLANEOUS) IMPLANT
TIP INNERVISION DETACH 50FR (MISCELLANEOUS) IMPLANT
TIP INNERVISION DETACH 56FR (MISCELLANEOUS) IMPLANT
TRAY FOLEY MTR SLVR 16FR STAT (SET/KITS/TRAYS/PACK) ×2 IMPLANT
TRAY LAPAROSCOPIC MC (CUSTOM PROCEDURE TRAY) ×2 IMPLANT
TROCAR ADV FIXATION 11X100MM (TROCAR) IMPLANT
TROCAR ADV FIXATION 5X100MM (TROCAR) ×2 IMPLANT
TUBE GASTRO BOLUS ENFIT 20 FR (TUBING) ×2 IMPLANT

## 2023-07-21 NOTE — Anesthesia Postprocedure Evaluation (Signed)
 Anesthesia Post Note  Patient: Darlene [name]  Procedure(s) Performed: REPAIR, HERNIA, PARAESOPHAGEAL, ROBOT-ASSISTED (Abdomen) INSERTION, GASTROSTOMY TUBE, ROBOT-ASSISTED (Left: Abdomen)     Patient location during evaluation: PACU Anesthesia Type: General Level of consciousness: awake and alert Pain management: pain level controlled Vital Signs Assessment: post-procedure vital signs reviewed and stable Respiratory status: spontaneous breathing, nonlabored ventilation and respiratory function stable Cardiovascular status: blood pressure returned to baseline and stable Postop Assessment: no apparent nausea or vomiting Anesthetic complications: no   No notable events documented.  Last Vitals:  Vitals:   07/21/23 1353 07/21/23 1507  BP: 116/74 106/63  Pulse: 93   Resp: 16 17  Temp: 36.7 C 36.5 C  SpO2: 100% 97%    Last Pain:  Vitals:   07/21/23 1507  TempSrc: Oral  PainSc:                  Erin Havers

## 2023-07-21 NOTE — Progress Notes (Signed)
 PHARMACY - ANTICOAGULATION Pharmacy Consult for heparin   Indication: atrial fibrillation  Allergies  Allergen Reactions   Cephalosporins Rash    Tolerated ceftriaxone  06/2023   Keflex [Cephalexin] Rash    Patient Measurements: Height: 5\' 10"  (177.8 cm) Weight: 97.5 kg (214 lb 15.2 oz) IBW/kg (Calculated) : 68.5 HEPARIN  DW (KG): 91  Vital Signs: Temp: 98 F (36.7 C) (05/07 1353) Temp Source: Oral (05/07 1353) BP: 116/74 (05/07 1353) Pulse Rate: 93 (05/07 1353)  Labs: Recent Labs    07/19/23 0450 07/19/23 1514 07/20/23 0503 07/21/23 0409  HGB 11.3*  --  11.1* 11.4*  HCT 35.1*  --  34.1* 36.0  PLT 525*  --  469* 445*  APTT 115* 81* 66* 37*  HEPARINUNFRC >1.10*  --  0.71* 0.21*  CREATININE 0.70  --   --   --     Estimated Creatinine Clearance: 67.4 mL/min (by C-G formula based on SCr of 0.7 mg/dL).   Assessment: 83 YO female with PMH significant for atrial fibrillation on Eliquis  admitted for hiatal hernia with oozing erosions leaking to UGIB. Plan operative repair currently scheduled for 5/07. Apixaban  now held, last dose 5/01 @ 2200. Pharmacy consulted to initiate IV heparin  in interim.   Using aPTT for rate adjustments, heparin  levels still falsely elevated due to recent apixaban  use. aPTT low therapeutic range. CBC stable with no reports of bleeding or issues with infusion.  Goal of Therapy:  Heparin  level 0.3-0.7 units/ml aPTT 66-102 seconds Monitor platelets by anticoagulation protocol: Yes   Plan:  Resume heparin  infusion at 1500 units/hr @ 8PM Check heparin  level in 8 hours and daily while on heparin  Continue to monitor H&H and platelets  Thank you for allowing pharmacy to be a part of this patient's care.  Claudia Cuff, PharmD, BCPS Clinical Pharmacist

## 2023-07-21 NOTE — Plan of Care (Signed)

## 2023-07-21 NOTE — Op Note (Signed)
 Operative Note  Darlene [name]  [id number]  [id number]  07/21/2023   Surgeon: Aldon Hung MD FACS   Assistant: Woodroe Hazel RNFA   Procedure performed: Robotic repair of Type III incarcerated paraesophageal hernia, gastropexy, gastrostomy tube placement, reduction of bowel from incarcerated right lower quadrant hernia, lysis of adhesions x63min    Preop diagnosis: incarcerated paraesophageal hernia with gastric outlet obstruction and bleeding cameron ulcers, incarcerated Right lower quadrant hernia containing colon Post-op diagnosis/intraop findings: same   Specimens: no Retained items: 53fr gastrostomy tube (10cc in balloon, flange sits at 4cm at the skin EBL: 30cc Complications: none   Description of procedure: After obtaining informed consent the patient was taken to the operating room and placed supine on operating room table where general endotracheal anesthesia was initiated, preoperative antibiotics were administered, SCDs applied, and a formal timeout was performed.  The abdomen was prepped and draped in usual sterile fashion.  Peritoneal access was gained with a left subcostal Veress needle followed by uneventful insufflation to 15 mmHg.  A left periumbilical 8 mm robotic trocar was inserted followed by the camera and there was no evidence of injury from our entry.  Adhesions of the colon to the right lower abdominal wall were immediately noted and confirmed to be free of any injury from our trocar placement.  Bilateral laparoscopic assisted tap blocks were performed with Exparel mixed with quarter percent Marcaine with epinephrine for postoperative pain control and additional trocars were placed under direct visualization including 3 additional 8 mm trocars and a right lateral 10 mm trocar for the assistant.  Liver retractor was inserted through a subxiphoid incision and secured for fixed retraction of the left lobe.  At this point the robot was docked and all instruments inserted  under direct visualization.  The pars flaccida was divided with the vessel sealer and the right crus identified.  The entirety of the stomach was noted to be herniated into the hernia sac which was quite large and extended towards the patient's right side.  Careful dissection ensued to free the hernia sac from the hiatus, starting at the anterior aspect and proceeding along the right crus and left crus.  We then divided the short gastrics using the vessel sealer and proceeded along the posterior aspect of the left crus.  The sac was extremely thickened and densely adherent in the mediastinum and dissecting this free was actually quite tedious.  Ultimately we were able to completely free the hernia sac and reduce this.  The sac was then excised and discarded.  The mediastinal attachments of the esophagus were mobilized carefully with a combination of blunt dissection and vessel sealer.  The esophagus was quite foreshortened as expected and I was only able to get the GE junction to the level of the hiatus but no further.  The crura were reapproximated posteriorly with 5 simple interrupted 0 Ethibonds leaving a hiatal aperture of about 2.5 cm, gently snug around the nondistended esophagus.  The cardia of the stomach was pexied to the diaphragm to recreate the angle of His with simple interrupted 2-0 silk sutures and 1-0 Ethibond suture.  A site was then chosen for the gastrostomy tube along the greater curvature just above the incisura.  This was noted to reach easily to the chosen site on the abdominal wall.  An incision was made here and a 20 [place] gastrostomy tube inserted.  A pursestring of 2-0 silk was placed on the chosen side on the stomach and then a gastrotomy made with  the cautery.  The G-tube was then inserted and the balloon inflated with 10 cc of water .  The pursestring was tied down.  3 Stamm sutures were placed around the G-tube securing the gastric serosa to the abdominal wall.  The flange was then  cinched down to about 4 cm with good apposition of the G-tube to the abdominal wall.  At this point the robot was undocked after removing all instruments under direct visualization.  The liver retractor was removed under direct visualization.  We turned to the right lower quadrant.  Adhesions of colon and omentum to the right lower quadrant abdominal wall were lysed sharply and then a combination of gentle traction and external pressure was employed to reduce the colon from the right lower quadrant hernia.  Additional adhesions were lysed to reduce any tension towards the hernia and minimize chance of reherniation of bowel.  Given duration of procedure already and this hernia will likely require either tap or open repair as it seems to be more of an inguinal hernia, formal repair was deferred at this time.  The abdomen was once again surveyed and hemostasis ensured.  The bowel that had been manipulated from the hernia was closely inspected and confirmed to be free of injury. The abdomen was desufflated and trocars removed.  The skin incisions were closed with subcuticular 4-0 Monocryl and Dermabond.  The G-tube was secured with three 2-0 nylon's in the flange. The patient was then awakened, extubated and taken to PACU in stable condition.    All counts were correct at the completion of the case.

## 2023-07-21 NOTE — Transfer of Care (Signed)
 Immediate Anesthesia Transfer of Care Note  Patient: Darlene [name]  Procedure(s) Performed: REPAIR, HERNIA, PARAESOPHAGEAL, ROBOT-ASSISTED (Abdomen) INSERTION, GASTROSTOMY TUBE, ROBOT-ASSISTED (Left: Abdomen)  Patient Location: PACU  Anesthesia Type:General  Level of Consciousness: drowsy and patient cooperative  Airway & Oxygen Therapy: Patient Spontanous Breathing and Patient connected to face mask oxygen  Post-op Assessment: Report given to RN and Post -op Vital signs reviewed and stable  Post vital signs: Reviewed  Last Vitals:  Vitals Value Taken Time  BP 119/64 07/21/23 1234  Temp 37.1 C 07/21/23 1234  Pulse 107 07/21/23 1240  Resp 15 07/21/23 1240  SpO2 97 % 07/21/23 1240  Vitals shown include unfiled device data.  Last Pain:  Vitals:   07/21/23 0748  TempSrc: (P) Oral  PainSc:          Complications: No notable events documented.

## 2023-07-21 NOTE — Progress Notes (Signed)
 OT Cancellation Note  Patient Details Name: VENETA CICCONI MRN: [id number] DOB: [birth year]   Cancelled Treatment:    Reason Eval/Treat Not Completed: Patient at procedure or test/ unavailable. Pt off unit for scheduled procedure. Will return as able to.    Nazia Rhines C, OT  Acute Rehabilitation Services Office [phone] Secure chat preferred  Mickael Alamo 07/21/2023, 7:46 AM

## 2023-07-21 NOTE — Progress Notes (Signed)
 OT Cancellation Note  Patient Details Name: Darlene [name] MRN: [id number] DOB: Nov 22, 1940   Cancelled Treatment:    Reason Eval/Treat Not Completed: Fatigue/lethargy limiting ability to participate. OT returned for second visit. Pt fatigued from surgery earlier in day, difficult to maintain arousal. OT will return tomorrow.   Aman Bonet C, OT  Acute Rehabilitation Services Office [phone] Secure chat preferred   Mickael Alamo 07/21/2023, 3:03 PM

## 2023-07-21 NOTE — Progress Notes (Signed)
 PT Cancellation Note  Patient Details Name: Darlene [name] MRN: [id number] DOB: 1940/03/27   Cancelled Treatment:    Reason Eval/Treat Not Completed: Patient at procedure or test/unavailable; off the floor for surgery.  Will follow up as pt able to participate and schedule permits.    Marley Simmers 07/21/2023, 9:14 AM Abigail Hoff, PT Acute Rehabilitation Services Office:[phone] 07/21/2023

## 2023-07-21 NOTE — Progress Notes (Signed)
   07/21/23 1951  BiPAP/CPAP/SIPAP  BiPAP/CPAP/SIPAP Pt Type Adult  Reason BIPAP/CPAP not in use Non-compliant (Pt refused using BIPAP at the hospital)  BiPAP/CPAP /SiPAP Vitals  Pulse Rate 100  Resp 18  SpO2 95 %  Bilateral Breath Sounds Clear  MEWS Score/Color  MEWS Score 1  MEWS Score Color Marrie Sizer

## 2023-07-21 NOTE — Progress Notes (Signed)
 PROGRESS NOTE    Darlene [name]  EAV:[id number] DOB: 09-19-1940 DOA: 06/29/2023 PCP: Jenelle Mis, FNP   Brief Narrative:  Darlene [name] is a 83 y.o. F with PMH significant for atrial fibrillation on eliquis , OSA on CPAP, HTN, HLD, GERD and esophageal strictures.  She presents to our facility with ongoing coffee-ground emesis on 06/29/2023.  CT abdomen pelvis with contrast not revealing of any acute source of bleeding although noted high-grade stenosis of the celiac axis and collateral flow from the superior mesenteric artery-patient previously scheduled for hiatal hernia and spigelian hernia repair with Dr. Jamse Mcgee later this month.  Initially admitted for EGD planned for 06/30/2023, overnight noted to transition to A-fib with RVR with profound hypoxia during the event with noted pulmonary edema and large volume aspiration -initially placed on BiPAP with worsening respiratory status ultimately intubated for airway protection.  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  Ultimately extubated 07/04/23.  ICU delirium improving over the past week, respiratory status improving, echo showing moderate to severe right ventricular dysfunction.  Patient required transient pressors in the interim likely in setting of sepsis due to large volume aspiration pneumonia.  Patient weaned to nasal cannula, off pressors now transition to p.o. midodrine  and otherwise stable for transfer to hospital floor and out of the ICU.  Hospital course 4/15 admit with coffee ground emesis for inpatient EGD 4/16 afib with RVR, concern for pulm edema, PCCM consult and ICU txfr 4/17: ETT air leak, fixed with more balloon inflation. On Precedex  and Fentanyl  drips for sedation. Upper endoscopy-->Cameron ulcers on hiatal hernia.  A-fib rate controlled on amiodarone . 4/21 Complaints of epigastric pain vs delirium overnight, still on amio gtt/heparin . Tube feeds paused this early this morning.    4/22 Delirium improving, patient had BM, Pulmonary Edema vs BL infiltrates on CXR-, CVP 18 -diuresed with lasix  and metolazone  4/24 Diuresed 3.5 L with metolazone /Lasix  4/26 Placed on BiPAP for increased WOB 4/27 Point-of-care echo showed moderate to severe RV dysfunction.  LV function appears normal.  There is consolidation at both bases with a trivial effusion unsuitable for thoracentesis.  Started on norepinephrine  to support blood pressure as patient required Precedex  for delirium, which exacerbated hypotension 4/28 thoracentesis. Right 100 ml. Exudate by lights. Likely parapneumonic  4/29 off norepi PCXR improved. WBC nml pct neg stopped abx. Feeling better  4/30 weaned down to 4L [place] - transition to [facility]   Assessment & Plan:  Acute hypoxic and hypercapnic respiratory failure Aspiration pneumonia in setting of large hiatal hernia Cannot rule out septic shock Complicated by volume overload/parapneumonic pleural effusion - Completed antibiotic course for aspiration pneumonia coverage - Tube feeds ongoing via core track, remains n.p.o. in the setting of large hiatal hernia and increased risk of aspiration and vomiting Seems to have been weaned off of oxygen.  Saturations are in the mid 90s on room air. - PT OT following, out of bed to chair as tolerated Patient remains on IV diuretics, furosemide  40 mg twice a day.  Monitor electrolytes.  Ins and outs and daily weights.  No electrolyte results noted in the last 2 days.  Will order labs for tomorrow.  Acute versus subacute GI bleed Hiatal hernia with oozing Darlene [name] erosions -Seen by gastroenterology.  Underwent EGD 4/17 confirms Cameron ulcers on hiatal hernia General Surgery was subsequently consulted. Plan is for hiatal hernia repair during this admission.  OR later today. NG tube feedings currently on hold.  Hypotension, improving Questionably  iatrogenic versus septic shock given above Orthostatic hypotension ongoing -IV pressors,  nor epi, off 4/ 29  -Continue midodrine  at 10mg  tid -attempt to wean as tolerated postoperatively, will hold any further titration preoperatively to avoid hypotension   A-fib with RVR On Eliquis  at baseline Patient noted to be on amiodarone .  Noted to be in atrial fibrillation this morning.  Patient has been off of diltiazem  and metoprolol  due to hypotension.   Severe deconditioning Ambulatory dysfunction PT OT following, discussed with family and staff to encourage ambulation, even short distances frequently throughout the day Disposition unclear at this time.   Hyperglycemia without history of diabetes Continue sliding scale insulin , hypoglycemic protocol A1c previously within normal limits 5.6  DVT prophylaxis: Eliquis  on hold for surgery Code Status:   Code Status: Full Code Family Communication: Daughter at the bedside   Consultants:  PCCM, GI, general surgery  Antimicrobials:  Previously completed  Subjective: Denies abdominal pain this morning.  Did have some nausea and 1 episode of emesis earlier today.  No shortness of breath or chest pain.  Daughter is at the bedside.    Objective: Vitals:   07/20/23 1439 07/20/23 1940 07/21/23 0402 07/21/23 0748  BP: (!) 120/52 (!) 118/58 126/65 (P) 134/67  Pulse: 94 70 67 (P) 100  Resp: 16 18 17  (P) 18  Temp: 97.6 F (36.4 C) 98 F (36.7 C) 97.8 F (36.6 C) (P) 98 F (36.7 C)  TempSrc:  Oral  (P) Oral  SpO2: 97% 97% 93% (P) 91%  Weight:      Height:        Intake/Output Summary (Last 24 hours) at 07/21/2023 1115 Last data filed at 07/21/2023 1100 Gross per 24 hour  Intake 780 ml  Output 1750 ml  Net -970 ml   Filed Weights   07/05/23 0522 07/11/23 0500 07/19/23 1547  Weight: 98 kg 96.5 kg 97.5 kg    Examination:  General appearance: Awake alert.  In no distress Resp: Clear to auscultation bilaterally.  Normal effort Cardio: S1-S2 is normal regular.  No S3-S4.  No rubs murmurs or bruit GI: Abdomen is soft.   Nontender nondistended.  Bowel sounds are present normal.  No masses organomegaly Extremities: No edema.  Full range of motion of lower extremities. Neurologic: Alert and oriented x3.  No focal neurological deficits.    Data Reviewed:   CBC: Recent Labs  Lab 07/15/23 0315 07/18/23 0827 07/19/23 0450 07/20/23 0503 07/21/23 0409  WBC 9.5 9.8 11.5* 9.8 10.7*  HGB 10.3* 10.9* 11.3* 11.1* 11.4*  HCT 32.5* 34.4* 35.1* 34.1* 36.0  MCV 99.7 99.1 98.6 99.4 98.6  PLT 465* 516* 525* 469* 445*   Basic Metabolic Panel: Recent Labs  Lab 07/15/23 0315 07/19/23 0450  NA 138 138  K 3.7 3.1*  CL 98 96*  CO2 30 35*  GLUCOSE 114* 133*  BUN 27* 27*  CREATININE 0.69 0.70  CALCIUM 9.0 9.1  MG 2.1  --   PHOS 3.0  --    GFR: Estimated Creatinine Clearance: 67.4 mL/min (by C-G formula based on SCr of 0.7 mg/dL).  CBG: Recent Labs  Lab 07/20/23 2034 07/20/23 2115 07/20/23 2345 07/21/23 0357 07/21/23 0744  GLUCAP 136* 144* 130* 116* 126*     Recent Results (from the past 240 hours)  Body fluid culture w Gram Stain     Status: None   Collection Time: 07/12/23  4:50 PM   Specimen: Pleura; Body Fluid  Result Value Ref Range Status  Specimen Description PLEURAL  Final   Special Requests RIGHT LUNG  Final   Gram Stain   Final    RARE WBC PRESENT, PREDOMINANTLY MONONUCLEAR NO ORGANISMS SEEN    Culture   Final    NO GROWTH 3 DAYS Performed at [facility], [address], [place], [place] [address]    Report Status 07/16/2023 FINAL  Final    Radiology Studies: No results found.  Scheduled Meds:  acetaminophen   1,000 mg Oral Once   [MAR Hold] amiodarone   200 mg Per Tube Daily   [MAR Hold] busPIRone   5 mg Per Tube TID   [MAR Hold] Chlorhexidine  Gluconate Cloth  6 each Topical Q0600   [MAR Hold] docusate  100 mg Per Tube BID   [MAR Hold] escitalopram   20 mg Per Tube Daily   [MAR Hold] furosemide   40 mg Intravenous BID   [MAR Hold] insulin  aspart  0-20 Units  Subcutaneous Q4H   [MAR Hold] lidocaine   1 patch Transdermal Q24H   [MAR Hold] melatonin  3 mg Per Tube QHS   [MAR Hold] midodrine   10 mg Per Tube Q8H   [MAR Hold] mouth rinse  15 mL Mouth Rinse 4 times per day   [MAR Hold] pantoprazole  (PROTONIX ) IV  40 mg Intravenous Q12H   [MAR Hold] polyethylene glycol  17 g Per Tube Daily   [MAR Hold] sodium chloride  flush  10-40 mL Intracatheter Q12H   Continuous Infusions:  diltiazem  (CARDIZEM ) infusion 5 mg/hr (07/21/23 1035)   feeding supplement (VITAL 1.5 CAL) 1,000 mL (07/20/23 0235)   lactated ringers  10 mL/hr at 07/21/23 0746     LOS: 21 days     Darlene [name],  [facility]  If 7PM-7AM, please contact night-coverage [email or web address]  07/21/2023, 11:15 AM

## 2023-07-21 NOTE — Interval H&P Note (Signed)
 History and Physical Interval Note:  07/21/2023 7:52 AM  Darlene [name]  has presented today for surgery, with the diagnosis of hiatal hernia SPIGELIAN HERNIA.  The various methods of treatment have been discussed with the patient and family. After consideration of risks, benefits and other options for treatment, the patient has consented to  Procedure(s) with comments: REPAIR, HERNIA, PARAESOPHAGEAL, ROBOT-ASSISTED (N/A) - ROBOTIC PARAESOPHAGEAL HERNIA REPAIR, ROBOTIC REPAIR OF SPIGELIAN HERNIA as a surgical intervention.  The patient's history has been reviewed, patient examined, no change in status, stable for surgery.  I have reviewed the patient's chart and labs.  Questions were answered to the patient's satisfaction.     Viraj Liby Loyola Rummage

## 2023-07-21 NOTE — Anesthesia Procedure Notes (Addendum)
 Procedure Name: Intubation Date/Time: 07/21/2023 8:51 AM  Performed by: Artemisa Bile D, CRNAPre-anesthesia Checklist: Patient identified, Emergency Drugs available, Suction available and Patient being monitored Patient Re-evaluated:Patient Re-evaluated prior to induction Oxygen Delivery Method: Circle system utilized Preoxygenation: Pre-oxygenation with 100% oxygen Induction Type: IV induction Ventilation: Mask ventilation without difficulty Laryngoscope Size: 3 Grade View: Grade II Tube type: Oral Number of attempts: 1 Airway Equipment and Method: Stylet and Oral airway Placement Confirmation: ETT inserted through vocal cords under direct vision, positive ETCO2 and breath sounds checked- equal and bilateral Secured at: 23 cm Tube secured with: Tape Dental Injury: Teeth and Oropharynx as per pre-operative assessment

## 2023-07-21 NOTE — Progress Notes (Signed)
 PT Cancellation Note  Patient Details Name: Darlene [name] MRN: [id number] DOB: 1940-05-21   Cancelled Treatment:    Reason Eval/Treat Not Completed: Fatigue/lethargy limiting ability to participate; noted too fatigued per OT note.  Will follow up next day.   Marley Simmers 07/21/2023, 3:43 PM Abigail Hoff, PT Acute Rehabilitation Services Office:[phone] 07/21/2023

## 2023-07-22 ENCOUNTER — Ambulatory Visit: Admitting: Student

## 2023-07-22 ENCOUNTER — Encounter (HOSPITAL_COMMUNITY): Payer: Self-pay | Admitting: Surgery

## 2023-07-22 DIAGNOSIS — J810 Acute pulmonary edema: Secondary | ICD-10-CM | POA: Diagnosis not present

## 2023-07-22 DIAGNOSIS — K922 Gastrointestinal hemorrhage, unspecified: Secondary | ICD-10-CM | POA: Diagnosis not present

## 2023-07-22 DIAGNOSIS — J9601 Acute respiratory failure with hypoxia: Secondary | ICD-10-CM | POA: Diagnosis not present

## 2023-07-22 DIAGNOSIS — K449 Diaphragmatic hernia without obstruction or gangrene: Secondary | ICD-10-CM | POA: Diagnosis not present

## 2023-07-22 LAB — GLUCOSE, CAPILLARY
Glucose-Capillary: 116 mg/dL — ABNORMAL HIGH (ref 70–99)
Glucose-Capillary: 156 mg/dL — ABNORMAL HIGH (ref 70–99)
Glucose-Capillary: 90 mg/dL (ref 70–99)
Glucose-Capillary: 190 mg/dL — ABNORMAL HIGH (ref 70–99)
Glucose-Capillary: 64 mg/dL — ABNORMAL LOW (ref 70–99)
Glucose-Capillary: 110 mg/dL — ABNORMAL HIGH (ref 70–99)

## 2023-07-22 LAB — BASIC METABOLIC PANEL WITH GFR
Anion gap: 10 (ref 5–15)
BUN: 25 mg/dL — ABNORMAL HIGH (ref 8–23)
CO2: 30 mmol/L (ref 22–32)
Calcium: 9.3 mg/dL (ref 8.9–10.3)
Chloride: 99 mmol/L (ref 98–111)
Creatinine, Ser: 0.9 mg/dL (ref 0.44–1.00)
GFR, Estimated: >60 mL/min (ref >60)
Glucose, Bld: 152 mg/dL — ABNORMAL HIGH (ref 70–99)
Potassium: 3.1 mmol/L — ABNORMAL LOW (ref 3.5–5.1)
Sodium: 139 mmol/L (ref 135–145)

## 2023-07-22 LAB — CBC
HCT: 32 % — ABNORMAL LOW (ref 36.0–46.0)
Hemoglobin: 10.4 g/dL — ABNORMAL LOW (ref 12.0–15.0)
MCH: 32.8 pg (ref 26.0–34.0)
MCHC: 32.5 g/dL (ref 30.0–36.0)
MCV: 100.9 fL — ABNORMAL HIGH (ref 80.0–100.0)
Platelets: 369 10*3/uL (ref 150–400)
RBC: 3.17 MIL/uL — ABNORMAL LOW (ref 3.87–5.11)
RDW: 13.8 % (ref 11.5–15.5)
WBC: 12.1 10*3/uL — ABNORMAL HIGH (ref 4.0–10.5)
nRBC: 0 % (ref 0.0–0.2)

## 2023-07-22 LAB — APTT: aPTT: 74 s — ABNORMAL HIGH (ref 24–36)

## 2023-07-22 LAB — MAGNESIUM: Magnesium: 1.9 mg/dL (ref 1.7–2.4)

## 2023-07-22 LAB — HEPARIN LEVEL (UNFRACTIONATED)
Heparin Unfractionated: 0.35 [IU]/mL (ref 0.30–0.70)
Heparin Unfractionated: 0.15 [IU]/mL — ABNORMAL LOW (ref 0.30–0.70)

## 2023-07-22 MED ORDER — VITAL 1.5 CAL PO LIQD: 1000.0000 mL | ORAL | Status: DC

## 2023-07-22 MED ORDER — MAGNESIUM SULFATE 2 GM/50ML IV SOLN
2.0000 g | Freq: Once | INTRAVENOUS | Status: AC
  Administered 2023-07-22: 2 g via INTRAVENOUS
  Filled 2023-07-22: qty 50

## 2023-07-22 MED ORDER — POTASSIUM CHLORIDE 20 MEQ PO PACK
40.0000 meq | PACK | Freq: Once | ORAL | Status: AC
  Administered 2023-07-22: 40 meq
  Filled 2023-07-22: qty 2

## 2023-07-22 MED ORDER — BISACODYL 10 MG RE SUPP: 10.0000 mg | Freq: Every day | RECTAL | Status: DC | PRN

## 2023-07-22 MED ORDER — FREE WATER
100.0000 mL | Status: DC
  Administered 2023-07-22 – 2023-07-29 (×41): 100 mL

## 2023-07-22 MED ORDER — POTASSIUM CHLORIDE 10 MEQ/100ML IV SOLN
10.0000 meq | INTRAVENOUS | Status: AC
  Administered 2023-07-22 (×4): 10 meq via INTRAVENOUS
  Filled 2023-07-22 (×4): qty 100

## 2023-07-22 MED ORDER — OSMOLITE 1.5 CAL PO LIQD: 1000.0000 mL | ORAL | Status: DC

## 2023-07-22 MED ORDER — VITAL 1.5 CAL PO LIQD
1000.0000 mL | ORAL | Status: DC
  Administered 2023-07-22 – 2023-07-26 (×4): 1000 mL
  Filled 2023-07-22 (×7): qty 1000

## 2023-07-22 MED ORDER — PROSOURCE TF20 ENFIT COMPATIBL EN LIQD
60.0000 mL | Freq: Every day | ENTERAL | Status: DC
Start: 2023-07-22 — End: 2023-07-27
  Administered 2023-07-22 – 2023-07-26 (×5): 60 mL
  Filled 2023-07-22 (×6): qty 60

## 2023-07-22 NOTE — Progress Notes (Signed)
 PROGRESS NOTE    Darlene [name]  ZOX:[id number] DOB: 1940/11/01 DOA: 06/29/2023 PCP: Jenelle Mis, FNP   Brief Narrative:  Darlene [name] is a 83 y.o. F with PMH significant for atrial fibrillation on eliquis , OSA on CPAP, HTN, HLD, GERD and esophageal strictures.  She presents to our facility with ongoing coffee-ground emesis on 06/29/2023.  CT abdomen pelvis with contrast not revealing of any acute source of bleeding although noted high-grade stenosis of the celiac axis and collateral flow from the superior mesenteric artery-patient previously scheduled for hiatal hernia and spigelian hernia repair with Dr. Jamse Mcgee later this month.  Initially admitted for EGD planned for 06/30/2023, overnight noted to transition to A-fib with RVR with profound hypoxia during the event with noted pulmonary edema and large volume aspiration -initially placed on BiPAP with worsening respiratory status ultimately intubated for airway protection.  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  Ultimately extubated 07/04/23.  ICU delirium improving over the past week, respiratory status improving, echo showing moderate to severe right ventricular dysfunction.  Patient required transient pressors in the interim likely in setting of sepsis due to large volume aspiration pneumonia.  Patient weaned to nasal cannula, off pressors now transition to p.o. midodrine  and otherwise stable for transfer to hospital floor and out of the ICU.  Hospital course 4/15 admit with coffee ground emesis for inpatient EGD 4/16 afib with RVR, concern for pulm edema, PCCM consult and ICU txfr 4/17: ETT air leak, fixed with more balloon inflation. On Precedex  and Fentanyl  drips for sedation. Upper endoscopy-->Cameron ulcers on hiatal hernia.  A-fib rate controlled on amiodarone . 4/21 Complaints of epigastric pain vs delirium overnight, still on amio gtt/heparin . Tube feeds paused this early this morning.    4/22 Delirium improving, patient had BM, Pulmonary Edema vs BL infiltrates on CXR-, CVP 18 -diuresed with lasix  and metolazone  4/24 Diuresed 3.5 L with metolazone /Lasix  4/26 Placed on BiPAP for increased WOB 4/27 Point-of-care echo showed moderate to severe RV dysfunction.  LV function appears normal.  There is consolidation at both bases with a trivial effusion unsuitable for thoracentesis.  Started on norepinephrine  to support blood pressure as patient required Precedex  for delirium, which exacerbated hypotension 4/28 thoracentesis. Right 100 ml. Exudate by lights. Likely parapneumonic  4/29 off norepi PCXR improved. WBC nml pct neg stopped abx. Feeling better  4/30 weaned down to 4L [place] - transition to [facility]   Assessment & Plan:  Acute hypoxic and hypercapnic respiratory failure Aspiration pneumonia in setting of large hiatal hernia Complicated by volume overload/parapneumonic pleural effusion - Completed antibiotic course for aspiration pneumonia coverage Seems to have been weaned off of oxygen.  Saturations are in the mid 90s on room air. - PT OT following, out of bed to chair as tolerated Patient remains on IV diuretics, furosemide  40 mg twice a day.  Monitor electrolytes.  Ins and outs and daily weights.    Acute versus subacute GI bleed Hiatal hernia with oozing Darlene [name] erosions -Seen by gastroenterology.  Underwent EGD 4/17 confirms Cameron ulcers on hiatal hernia General Surgery was subsequently consulted. Patient underwent repair of the incarcerated paraesophageal hernia.  Also underwent gastrostomy tube placement and reduction of bowel from the incarcerated right lower quadrant hernia. Started on tube feedings by general surgery.  Hypotension, improving Questionably iatrogenic versus septic shock given above Orthostatic hypotension ongoing Required vasopressors in the ICU. Currently on midodrine .  Continue to monitor.  Blood pressure is stable.   A-fib  with RVR On Eliquis   at baseline.  Currently on IV heparin . Patient noted to be on amiodarone .  Required Cardizem  infusion in the OR yesterday.  Heart rate noted to be improved this morning.  Continue to monitor.  Needs to be on telemetry.    Hypokalemia Supplement potassium.  Magnesium  1.9.  Will be supplemented as well.   Severe deconditioning Ambulatory dysfunction PT and OT following.  SNF is recommended for short-term rehab.   Hyperglycemia without history of diabetes Continue sliding scale insulin , hypoglycemic protocol A1c previously within normal limits 5.6  DVT prophylaxis: Currently on IV heparin .  Transition back to Eliquis  when cleared to do so by general surgery. Code Status:   Code Status: Full Code Family Communication: No family at bedside. Disposition: SNF for short-term rehab   Consultants:  PCCM, GI, general surgery  Antimicrobials:  Previously completed  Subjective: Feels well this morning.  Denies any abdominal pain at this time.  No nausea vomiting.  No shortness of breath.    Objective: Vitals:   07/21/23 1936 07/21/23 1951 07/21/23 2325 07/22/23 0509  BP: (!) 100/54  (!) 108/46 112/63  Pulse: (!) 108 100 92 62  Resp: 17 18  17   Temp: 98.6 F (37 C)  98.6 F (37 C) 97.8 F (36.6 C)  TempSrc: Oral  Oral   SpO2: 96% 95% 95% 99%  Weight:      Height:        Intake/Output Summary (Last 24 hours) at 07/22/2023 1048 Last data filed at 07/22/2023 0900 Gross per 24 hour  Intake 1327.67 ml  Output 55 ml  Net 1272.67 ml   Filed Weights   07/05/23 0522 07/11/23 0500 07/19/23 1547  Weight: 98 kg 96.5 kg 97.5 kg    Examination:  General appearance: Awake alert.  In no distress Resp: Normal effort at rest.  Diminished air entry at the bases.  No wheezing or rhonchi. Cardio: S1-S2 is normal regular.  No S3-S4.  No rubs murmurs or bruit GI: Abdominal exam deferred to general surgery.  Abdominal binder noted. Extremities: No edema.  Physical deconditioning  noted Neurologic: No obvious focal neurological deficits   Data Reviewed:   CBC: Recent Labs  Lab 07/18/23 0827 07/19/23 0450 07/20/23 0503 07/21/23 0409 07/22/23 0351  WBC 9.8 11.5* 9.8 10.7* 12.1*  HGB 10.9* 11.3* 11.1* 11.4* 10.4*  HCT 34.4* 35.1* 34.1* 36.0 32.0*  MCV 99.1 98.6 99.4 98.6 100.9*  PLT 516* 525* 469* 445* 369   Basic Metabolic Panel: Recent Labs  Lab 07/19/23 0450 07/22/23 0351  NA 138 139  K 3.1* 3.1*  CL 96* 99  CO2 35* 30  GLUCOSE 133* 152*  BUN 27* 25*  CREATININE 0.70 0.90  CALCIUM 9.1 9.3  MG  --  1.9   GFR: Estimated Creatinine Clearance: 59.9 mL/min (by C-G formula based on SCr of 0.9 mg/dL).  CBG: Recent Labs  Lab 07/21/23 1603 07/21/23 1930 07/21/23 2327 07/22/23 0502 07/22/23 0942  GLUCAP 145* 144* 103* 116* 156*     Recent Results (from the past 240 hours)  Body fluid culture w Gram Stain     Status: None   Collection Time: 07/12/23  4:50 PM   Specimen: Pleura; Body Fluid  Result Value Ref Range Status   Specimen Description PLEURAL  Final   Special Requests RIGHT LUNG  Final   Gram Stain   Final    RARE WBC PRESENT, PREDOMINANTLY MONONUCLEAR NO ORGANISMS SEEN    Culture   Final  NO GROWTH 3 DAYS Performed at [facility], [address] [place], [place] [address]    Report Status 07/16/2023 FINAL  Final    Radiology Studies: No results found.  Scheduled Meds:  acetaminophen  (TYLENOL ) oral liquid 160 mg/5 mL  650 mg Per Tube Q6H   amiodarone   200 mg Per Tube Daily   busPIRone   5 mg Per Tube TID   Chlorhexidine  Gluconate Cloth  6 each Topical Q0600   docusate  100 mg Per Tube BID   escitalopram   20 mg Per Tube Daily   furosemide   40 mg Intravenous BID   insulin  aspart  0-20 Units Subcutaneous Q4H   lidocaine   1 patch Transdermal Q24H   melatonin  3 mg Per Tube QHS   metoCLOPramide  (REGLAN ) injection  10 mg Intravenous Q6H   midodrine   10 mg Per Tube Q8H   mouth rinse  15 mL Mouth Rinse 4 times per  day   pantoprazole  (PROTONIX ) IV  40 mg Intravenous Q12H   polyethylene glycol  17 g Per Tube Daily   sodium chloride  flush  10-40 mL Intracatheter Q12H   Continuous Infusions:  feeding supplement (VITAL 1.5 CAL) 1,000 mL (07/21/23 2302)   heparin  1,500 Units/hr (07/21/23 2043)   potassium chloride  10 mEq (07/22/23 0948)     LOS: 22 days     Maylene Spear,  [facility]  If 7PM-7AM, please contact night-coverage [email or web address]  07/22/2023, 10:48 AM

## 2023-07-22 NOTE — NC FL2 (Signed)
 [place]  MEDICAID FL2 LEVEL OF CARE FORM     IDENTIFICATION  Patient Name: Darlene [name] Birthdate: April 29, 1940 Sex: female Admission Date (Current Location): 06/29/2023  [facility] and [place] Number:      Facility and Address:  [facility] [place], [address], [place], [place] [address]      Provider Number: [id number]  Attending Physician Name and Address:  Maylene Spear, MD  Relative Name and Phone Number:       Current Level of Care: Hospital Recommended Level of Care: Skilled Nursing Facility Prior Approval Number:    Date Approved/Denied:   PASRR Number: [id number]  Discharge Plan: SNF    Current Diagnoses: Patient Active Problem List   Diagnosis Date Noted   Hiatal hernia 07/14/2023   Acute pulmonary edema (HCC) 07/14/2023   Aspiration into airway 07/06/2023   Acute respiratory failure with hypoxia (HCC) 06/30/2023   Acute GI bleeding 06/29/2023   Intractable nausea and vomiting 06/29/2023   Encounter to establish care with new provider 06/15/2023   Class 1 obesity with serious comorbidity and body mass index (BMI) of 32.0 to 32.9 in adult 06/15/2023   Physical deconditioning 01/15/2020   At moderate risk for fall 01/15/2020   Pulmonary hypertension (HCC) 01/12/2020   Central sleep apnea associated with atrial fibrillation (HCC) 06/26/2019   Severe obstructive sleep apnea-hypopnea syndrome 05/31/2019   Hypoxemia requiring supplemental oxygen 05/09/2019   LVH (left ventricular hypertrophy) due to hypertensive disease, without heart failure 05/09/2019   Atrial fibrillation with RVR (HCC) 04/15/2019   Esophageal stricture 09/04/2018   Iron deficiency anemia 09/04/2018   Continuous leakage of urine 02/17/2018   Vitamin D  deficiency 02/16/2018   Hyperlipidemia, mixed 10/01/2017   Abnormal glucose 09/30/2017   History of colon polyps 09/29/2017   GERD (gastroesophageal reflux disease) 04/01/2017   DJD (degenerative joint disease)     Chronic lower back pain    Depression     Orientation RESPIRATION BLADDER Height & Weight     Self  Normal Continent Weight: 214 lb 15.2 oz (97.5 kg) Height:  5\' 10"  (177.8 cm)  BEHAVIORAL SYMPTOMS/MOOD NEUROLOGICAL BOWEL NUTRITION STATUS      Incontinent Diet, Feeding tube (G-tube; see d/c summary)  AMBULATORY STATUS COMMUNICATION OF NEEDS Skin   Extensive Assist Verbally Normal                       Personal Care Assistance Level of Assistance  Bathing, Feeding, Dressing Bathing Assistance: Maximum assistance Feeding assistance: Maximum assistance Dressing Assistance: Maximum assistance     Functional Limitations Info  Sight, Hearing, Speech Sight Info: Adequate Hearing Info: Impaired Speech Info: Adequate    SPECIAL CARE FACTORS FREQUENCY  PT (By licensed PT), OT (By licensed OT)     PT Frequency: 5x/week OT Frequency: 5x/week            Contractures Contractures Info: Not present    Additional Factors Info  Code Status, Allergies Code Status Info: full code Allergies Info: Cephalosporins, Keflex (cephalexin)           Current Medications (07/22/2023):  This is the current hospital active medication list Current Facility-Administered Medications  Medication Dose Route Frequency Provider Last Rate Last Admin   acetaminophen  (TYLENOL ) 160 MG/5ML solution 650 mg  650 mg Per Tube Q6H Aldon Hung A, MD   650 mg at 07/22/23 5621   amiodarone  (PACERONE ) tablet 200 mg  200 mg Per Tube Daily Adalberto Acton, MD  200 mg at 07/22/23 0951   bisacodyl  (DULCOLAX) suppository 10 mg  10 mg Rectal Daily PRN Adalberto Acton, MD       busPIRone  (BUSPAR ) tablet 5 mg  5 mg Per Tube TID Aldon Hung A, MD   5 mg at 07/22/23 1610   Chlorhexidine  Gluconate Cloth 2 % PADS 6 each  6 each Topical Q0600 Adalberto Acton, MD   6 each at 07/22/23 1310   docusate (COLACE) 50 MG/5ML liquid 100 mg  100 mg Per Tube BID Aldon Hung A, MD   100 mg at 07/22/23 9604    escitalopram  (LEXAPRO ) tablet 20 mg  20 mg Per Tube Daily Aldon Hung A, MD   20 mg at 07/22/23 5409   feeding supplement (PROSource TF20) liquid 60 mL  60 mL Per Tube Daily Maylene Spear, MD       feeding supplement (VITAL 1.5 CAL) liquid 1,000 mL  1,000 mL Per Tube Continuous Krishnan, Gokul, MD       fentaNYL  (SUBLIMAZE ) injection 12.5 mcg  12.5 mcg Intravenous Q2H PRN Aldon Hung A, MD       free water  100 mL  100 mL Per Tube Q4H Maylene Spear, MD       furosemide  (LASIX ) injection 40 mg  40 mg Intravenous BID Aldon Hung A, MD   40 mg at 07/22/23 0819   heparin  ADULT infusion 100 units/mL (25000 units/250mL)  1,500 Units/hr Intravenous Continuous Maylene Spear, MD 15 mL/hr at 07/22/23 1317 1,500 Units/hr at 07/22/23 1317   insulin  aspart (novoLOG ) injection 0-20 Units  0-20 Units Subcutaneous Q4H Aldon Hung A, MD   4 Units at 07/22/23 8119   ipratropium-albuterol  (DUONEB) 0.5-2.5 (3) MG/3ML nebulizer solution 3 mL  3 mL Nebulization Q4H PRN Adalberto Acton, MD       lidocaine  (LIDODERM ) 5 % 1 patch  1 patch Transdermal Q24H Adalberto Acton, MD   1 patch at 07/22/23 1478   lip balm (CARMEX) ointment   Topical PRN Adalberto Acton, MD       melatonin tablet 3 mg  3 mg Per Tube QHS Aldon Hung A, MD   3 mg at 07/21/23 2248   methocarbamol (ROBAXIN) injection 500 mg  500 mg Intravenous Q6H PRN Aldon Hung A, MD   500 mg at 07/22/23 2956   metoCLOPramide  (REGLAN ) injection 10 mg  10 mg Intravenous Q6H Aldon Hung A, MD   10 mg at 07/22/23 1310   midodrine  (PROAMATINE ) tablet 10 mg  10 mg Per Tube Q8H Aldon Hung A, MD   10 mg at 07/22/23 0527   naloxone  (NARCAN ) injection 0.4 mg  0.4 mg Intravenous Q4H PRN Adalberto Acton, MD       ondansetron  (ZOFRAN ) injection 4 mg  4 mg Intravenous Q6H PRN Aldon Hung A, MD   4 mg at 07/21/23 2248   Oral care mouth rinse  15 mL Mouth Rinse 4 times per day Adalberto Acton, MD   15 mL at 07/22/23 1252   Oral care  mouth rinse  15 mL Mouth Rinse PRN Adalberto Acton, MD       pantoprazole  (PROTONIX ) injection 40 mg  40 mg Intravenous Q12H Aldon Hung A, MD   40 mg at 07/22/23 2130   polyethylene glycol (MIRALAX  / GLYCOLAX ) packet 17 g  17 g Per Tube Daily Aldon Hung A, MD   17 g at 07/22/23 0951   sodium chloride  flush (NS) 0.9 % injection 10-40 mL  10-40 mL Intracatheter Q12H Aldon Hung A, MD   10 mL at 07/22/23 4098   sodium chloride  flush (NS) 0.9 % injection 10-40 mL  10-40 mL Intracatheter PRN Adalberto Acton, MD       traMADol  (ULTRAM ) tablet 50 mg  50 mg Per Tube Q6H PRN Adalberto Acton, MD   50 mg at 07/21/23 2248     Discharge Medications: Please see discharge summary for a list of discharge medications.  Relevant Imaging Results:  Relevant Lab Results:   Additional Information SSN [id number]  Katrinka Parr, [place]

## 2023-07-22 NOTE — Evaluation (Signed)
 Clinical/Bedside Swallow Evaluation Patient Details  Name: Darlene [name] MRN: [id number] Date of Birth: 1940/05/05  Today's Date: 07/22/2023 Time: SLP Start Time (ACUTE ONLY): 1434 SLP Stop Time (ACUTE ONLY): 1445 SLP Time Calculation (min) (ACUTE ONLY): 11 min  Past Medical History:  Past Medical History:  Diagnosis Date   Anxiety    Chronic lower back pain    Depression    DJD (degenerative joint disease)    knees, neck   GERD (gastroesophageal reflux disease)    has had dilitation in the past   Hypertension    Past Surgical History:  Past Surgical History:  Procedure Laterality Date   ABDOMINAL HYSTERECTOMY  03/1979   BREAST BIOPSY Left 1986 and 1987   CATARACT EXTRACTION, BILATERAL Bilateral 2014   Dr. Michaeleen Adler, in [place]   ESOPHAGOGASTRODUODENOSCOPY N/A 07/01/2023   Procedure: EGD (ESOPHAGOGASTRODUODENOSCOPY);  Surgeon: Daina Drum, MD;  Location: [facility] ENDOSCOPY;  Service: Gastroenterology;  Laterality: N/A;  Sedation provided by ICU   [facility]   INSERTION, GASTROSTOMY TUBE, ROBOT-ASSISTED Left 07/21/2023   Procedure: INSERTION, GASTROSTOMY TUBE, ROBOT-ASSISTED;  Surgeon: Adalberto Acton, MD;  Location: [facility] OR;  Service: General;  Laterality: Left;   XI ROBOTIC ASSISTED PARAESOPHAGEAL HERNIA REPAIR N/A 07/21/2023   Procedure: REPAIR, HERNIA, PARAESOPHAGEAL, ROBOT-ASSISTED;  Surgeon: Adalberto Acton, MD;  Location: [facility] OR;  Service: General;  Laterality: N/A;  ROBOTIC PARAESOPHAGEAL HERNIA REPAIR, ROBOTIC REPAIR OF SPIGELIAN HERNIA   HPI:  83 yo female admitted 06/29/23 with acute hypoxic hypercapnic respiratory failure in the setting of aspiration complicated by GI bleed. Intubated 4/16-4/20. 4/17 EGD. Now s/p repair of Type III incarcerated paraesophageal hernia, gastropexy, gastrostomy tube 5/7, reduction of bowel from incarcerated RLQ hernia and LOA. SLP swallow eval ordered 5/8. Notes indicate some regurgitation of TF.   Surgeon approved advancing diet as tolerated to puree (continue for two weeks post [facility] repair). PMhx: depression, DJD, OSA, pulmonary HTN, obesity, HLD, Afib.    Assessment / Plan / Recommendation  Clinical Impression  Pt participated in a clinical swallow assessment, revealing no s/s of an oropharyngeal dysphagia. Oral mechanism exam was normal.  She accepted ice chips, sips of water , and bites of applesauce with adequate attention and mastication, the appearance of a brisk swallow, and no s/s of aspiration.  No SLP f/u is needed - no dysphagia. Recommend starting clear liquid diet and advancing per surgery.  Our service will sign off. SLP Visit Diagnosis: Dysphagia, unspecified (R13.10)    Aspiration Risk  No limitations    Diet Recommendation   Thin  Medication Administration: Via alternative means    Other  Recommendations Oral Care Recommendations: Oral care BID    Recommendations for follow up therapy are one component of a multi-disciplinary discharge planning process, led by the attending physician.  Recommendations may be updated based on patient status, additional functional criteria and insurance authorization.  Follow up Recommendations No SLP follow up        Swallow Study   General Date of Onset: 06/29/23 HPI: 83 yo female admitted 06/29/23 with acute hypoxic hypercapnic respiratory failure in the setting of aspiration complicated by GI bleed. Intubated 4/16-4/20. 4/17 EGD. Now s/p repair of Type III incarcerated paraesophageal hernia, gastropexy, gastrostomy tube 5/7, reduction of bowel from incarcerated RLQ hernia and LOA. SLP swallow eval ordered 5/8. Notes indicate some regurgitation of TF.  Surgeon approved advancing diet as tolerated to puree (continue for two weeks  post [facility] repair). PMhx: depression, DJD, OSA, pulmonary HTN, obesity, HLD, Afib. Type of Study: Bedside Swallow Evaluation Previous Swallow Assessment: no Diet Prior to this Study: NPO Temperature Spikes  Noted: No Respiratory Status: Room air History of Recent Intubation: No Behavior/Cognition: Alert;Cooperative;Pleasant mood Oral Cavity Assessment: Within Functional Limits Oral Care Completed by SLP: No Oral Cavity - Dentition: Adequate natural dentition Vision: Functional for self-feeding Self-Feeding Abilities: Able to feed self Patient Positioning: Upright in bed Baseline Vocal Quality: Normal Volitional Cough: Strong Volitional Swallow: Able to elicit    Oral/Motor/Sensory Function Overall Oral Motor/Sensory Function: Within functional limits   Ice Chips Ice chips: Within functional limits   Thin Liquid Thin Liquid: Within functional limits    Nectar Thick Nectar Thick Liquid: Not tested   Honey Thick Honey Thick Liquid: Not tested   Puree Puree: Within functional limits   Solid     Solid: Not tested      Myna Asal Laurice 07/22/2023,2:56 PM  Mylinda Asa L. Beatris Lincoln, MA CCC/SLP Clinical Specialist - Acute Care SLP Acute Rehabilitation Services Office number [phone]

## 2023-07-22 NOTE — Progress Notes (Addendum)
 Nutrition Follow-up  DOCUMENTATION CODES:  Not applicable  INTERVENTION:  Transition to standard TF formula via G-tube: Vital 1.5 at 45 mL/hr (1080 mL/hr) 60 mL ProSource TF20 - Daily Free water  flush: 100 mL q4h  Regimen provides 1700 kcal, 93 gm protein, 1425 mL total free water  daily.  NUTRITION DIAGNOSIS:  Inadequate oral intake related to altered GI function (large hiatal hernia; cameron erosions) as evidenced by NPO status. - remains applicable  GOAL:  Patient will meet greater than or equal to 90% of their needs - goal met via TF  MONITOR:  Weight trends, Labs, TF tolerance, I & O's  REASON FOR ASSESSMENT:  Ventilator, Consult Enteral/tube feeding initiation and management  ASSESSMENT:  Pt admitted with coffee ground emesis. PMH significant for afib on eliquis , OSA, HTN, HLD, GERD and esophageal stricture.  4/15: admit with coffee ground emesis 4/16: developed afib w/RVR, concern for pulmonary edema, transfer to ICU; intubated 4/17: EGD- large hiatal hernia with oozing cameron erosions; large residue and coffee ground material in hiatal hernia 4/18: cortrak placed; tip in distal stomach past hiatal hernia 4/20: extubated  4/28: thoracentesis-  yield fluid 4/30: Transferred to floor 5/07: Op, s/p  repair of incarcerated paraesophageal hernia, gastropexy, lysis of adhesions, and 20 Fr G-tube placement  Met with pt and pt daughter in room. Pt reports feeling ok after surgery, denies any nausea or vomiting. Reports having some regurgitation. Current tube feed at goal. Discussed with pt and daughter, transitioning to a standard formula to assess tolerance and then transition to bolus feeds to allow time off pump. Both in agreement.   Per MD note, plan for SLP evaluation for diet advancement. Discussed TF changes with surgery PA, PA would like to keep pt on Vital at this time.   Admit weight: 103.7 kg Current weight: 97.5 kg (5/5)  Medications: Colace, Lasix ,  NovoLog  SSI, Melatonin, Reglan , Protonix , Miralax , IV magnesium  sulfate, IV potassium chloride  Labs: Sodium 139, Potassium 3.1, BUN 25, Magnesium  1.9 CBG: 103-145 x 24 hrs   UOP: 150 mL x 24 hr   Diet Order:   Diet Order             Diet NPO time specified  Diet effective midnight                   EDUCATION NEEDS:  No education needs have been identified at this time  Skin:  Skin Assessment: Reviewed RN Assessment  Last BM:  5/5  Height:  Ht Readings from Last 1 Encounters:  07/02/23 5\' 10"  (1.778 m)   Weight:  Wt Readings from Last 1 Encounters:  07/19/23 97.5 kg   Ideal Body Weight:  68.2 kg  BMI:  Body mass index is 30.84 kg/m.  Estimated Nutritional Needs:  Kcal:  1700-1900 Protein:  85-100g Fluid:  >/=1.7L   Doneta Furbish RD, LDN Clinical Dietitian

## 2023-07-22 NOTE — Progress Notes (Signed)
 1 Day Post-Op   Subjective/Chief Complaint: Having some post op soreness but well controlled with pain medications. Passing flatus. No BM since OR. Has not been oob. Says she is having some reflux up to back of throat but she is not having nausea or vomiting.  Daughter at bedside  Objective: Vital signs in last 24 hours: Temp:  [97.7 F (36.5 C)-98.7 F (37.1 C)] 97.8 F (36.6 C) (05/08 0509) Pulse Rate:  [62-108] 62 (05/08 0509) Resp:  [13-20] 17 (05/08 0509) BP: (100-120)/(46-78) 112/63 (05/08 0509) SpO2:  [90 %-100 %] 99 % (05/08 0509) Last BM Date : 07/19/23  Intake/Output from previous day: 05/07 0701 - 05/08 0700 In: 1827.7 [I.V.:1077.7; IV Piggyback:750] Out: 180 [Urine:150; Blood:30] Intake/Output this shift: No intake/output data recorded.  Alert, NAD, frail Cortrak in place Unlabored respirations on room air Abdomen is soft, nondistended, and nontender GU: foley in place draining dark urine   Lab Results:  Recent Labs    07/21/23 0409 07/22/23 0351  WBC 10.7* 12.1*  HGB 11.4* 10.4*  HCT 36.0 32.0*  PLT 445* 369   BMET Recent Labs    07/22/23 0351  NA 139  K 3.1*  CL 99  CO2 30  GLUCOSE 152*  BUN 25*  CREATININE 0.90  CALCIUM 9.3   PT/INR No results for input(s): "LABPROT", "INR" in the last 72 hours. ABG No results for input(s): "PHART", "HCO3" in the last 72 hours.  Invalid input(s): "PCO2", "PO2"  Studies/Results: No results found.   Anti-infectives: Anti-infectives (From admission, onward)    Start     Dose/Rate Route Frequency Ordered Stop   07/20/23 1015  cefTRIAXone  (ROCEPHIN ) 2 g in sodium chloride  0.9 % 100 mL IVPB  Status:  Discontinued       Note to Pharmacy: Pharmacy may adjust dosing strength / duration / interval for maximal efficacy   2 g 200 mL/hr over 30 Minutes Intravenous On call to O.R. 07/20/23 0920 07/21/23 0559   07/09/23 0945  piperacillin -tazobactam (ZOSYN ) IVPB 3.375 g  Status:  Discontinued        3.375  g 12.5 mL/hr over 240 Minutes Intravenous Every 8 hours 07/09/23 0845 07/13/23 0842   07/05/23 0915  cefTRIAXone  (ROCEPHIN ) 2 g in sodium chloride  0.9 % 100 mL IVPB  Status:  Discontinued        2 g 200 mL/hr over 30 Minutes Intravenous Every 24 hours 07/05/23 0815 07/09/23 0845   06/30/23 2100  piperacillin -tazobactam (ZOSYN ) IVPB 3.375 g       Placed in "Followed by" Linked Group   3.375 g 12.5 mL/hr over 240 Minutes Intravenous Every 8 hours 06/30/23 1356 07/03/23 0016   06/30/23 1445  piperacillin -tazobactam (ZOSYN ) IVPB 3.375 g       Placed in "Followed by" Linked Group   3.375 g 100 mL/hr over 30 Minutes Intravenous  Once 06/30/23 1356 06/30/23 1626       Assessment/Plan:  Hiatal hernia with oozing Cameron erosions leaking to UGIB Spigelian hernia containing ascending colon  POD 1 s/p Robotic repair of Type III incarcerated paraesophageal hernia, gastropexy, gastrostomy tube placement, reduction of bowel from incarcerated right lower quadrant hernia, lysis of adhesions x53min Dr. Lanell Pinta 5/7 - having some reflux without n/v. Continue IV protonix . Mobilize as able - continue Tfs (at goal) - hep gtt resumed. Hgb 10.4 from 11.4 but UOP 150 ml/24h. BP soft. May benefit from som IVF (has been vol overloaded prior during admission). Trend hgb with am labs - getting electrolyte repletion  per TRH  FEN: NPO, Tfs per g tube ID:  none currently VTE: hep gtt   LOS: 22 days    Elwin Hammond, [facility] Surgery 07/22/2023, 10:23 AM Please see Amion for pager number during day hours 7:00am-4:30pm

## 2023-07-22 NOTE — Progress Notes (Signed)
 PHARMACY - ANTICOAGULATION Pharmacy Consult for heparin   Indication: atrial fibrillation  Allergies  Allergen Reactions   Cephalosporins Rash    Tolerated ceftriaxone  06/2023   Keflex [Cephalexin] Rash    Patient Measurements: Height: 5\' 10"  (177.8 cm) Weight: 97.5 kg (214 lb 15.2 oz) IBW/kg (Calculated) : 68.5 HEPARIN  DW (KG): 91  Vital Signs: Temp: 98.6 F (37 C) (05/07 2325) Temp Source: Oral (05/07 2325) BP: 108/46 (05/07 2325) Pulse Rate: 92 (05/07 2325)  Labs: Recent Labs    07/19/23 0450 07/19/23 1514 07/20/23 0503 07/21/23 0409 07/22/23 0351  HGB 11.3*  --  11.1* 11.4* 10.4*  HCT 35.1*  --  34.1* 36.0 32.0*  PLT 525*  --  469* 445* 369  APTT 115*   < > 66* 37* 74*  HEPARINUNFRC >1.10*  --  0.71* 0.21* 0.35  CREATININE 0.70  --   --   --  0.90   < > = values in this interval not displayed.    Estimated Creatinine Clearance: 59.9 mL/min (by C-G formula based on SCr of 0.9 mg/dL).   Assessment: 83 YO female with PMH significant for atrial fibrillation on Eliquis  admitted for hiatal hernia with oozing erosions leaking to UGIB. Plan operative repair currently scheduled for 5/07. Apixaban  now held, last dose 5/01 @ 2200. Pharmacy consulted to initiate IV heparin  in interim.   AM: aPTT and heparin  level within goal on 1500 units/hr (correlated). CBC shows Hgb 11 >10, plts 445 > 369. No signs/symptoms of bleeding reported or issues with the heparin  infusion  Goal of Therapy:  Heparin  level 0.3-0.7 units/ml aPTT 66-102 seconds Monitor platelets by anticoagulation protocol: Yes   Plan:  Continue heparin  infusion at 1500 units/hr  Check confirmatory heparin  level in 8 hours and daily Continue to monitor H&H and platelets  Thank you for allowing pharmacy to be a part of this patient's care.  Young Hensen, PharmD. Clinical Pharmacist 07/22/2023 4:33 AM

## 2023-07-22 NOTE — Progress Notes (Addendum)
 PHARMACY - ANTICOAGULATION Pharmacy Consult for heparin   Indication: atrial fibrillation  Allergies  Allergen Reactions   Cephalosporins Rash    Tolerated ceftriaxone  06/2023   Keflex [Cephalexin] Rash    Patient Measurements: Height: 5\' 10"  (177.8 cm) Weight: 97.5 kg (214 lb 15.2 oz) IBW/kg (Calculated) : 68.5 HEPARIN  DW (KG): 91  Vital Signs: Temp: 97.8 F (36.6 C) (05/08 1153) Temp Source: Oral (05/08 1153) BP: 100/63 (05/08 1153) Pulse Rate: 74 (05/08 1153)  Labs: Recent Labs    07/20/23 0503 07/21/23 0409 07/22/23 0351 07/22/23 1425  HGB 11.1* 11.4* 10.4*  --   HCT 34.1* 36.0 32.0*  --   PLT 469* 445* 369  --   APTT 66* 37* 74*  --   HEPARINUNFRC 0.71* 0.21* 0.35 0.15*  CREATININE  --   --  0.90  --     Estimated Creatinine Clearance: 59.9 mL/min (by C-G formula based on SCr of 0.9 mg/dL).   Assessment: 83 YO female with PMH significant for atrial fibrillation on Eliquis  admitted for hiatal hernia with oozing erosions leaking to UGIB. Plan operative repair currently scheduled for 5/07. Apixaban  now held, last dose 5/01 @ 2200. Pharmacy consulted to initiate IV heparin  in interim.   Heparin  level subtherapeutic at 0.15 on 1500 units/hr. Hgb down to 10.4, platelets are normal. No signs/symptoms of bleeding reported or issues with the heparin  infusion per RN. Of note, RN is switching the heparin  drip to infuse through the PICC due to pain in the PIV.  Goal of Therapy:  Heparin  level 0.3-0.7 units/ml Monitor platelets by anticoagulation protocol: Yes   Plan:  Increase heparin  infusion to 1750 units/hr  8h heparin  level  Daily heparin  level and CBC Monitor for s/sx of bleeding  Thank you for involving pharmacy in this patient's care.  Caroline Cinnamon, PharmD, BCPS Clinical Pharmacist Clinical phone for 07/22/2023 is [phone] 07/22/2023 3:35 PM

## 2023-07-22 NOTE — TOC Progression Note (Signed)
 Transition of Care [facility]) - Progression Note    Patient Details  Name: Darlene [name] MRN: [id number] Date of Birth: 06/03/1940  [facility] CM/SW Contact  Buckley Bradly Art [place], [place] Phone Number: 07/22/2023, 2:48 PM  Clinical Narrative:     Spoke with daughter Adah Acron and confirmed they would like to pursue SNF. She would like for pt to go to [facility] center. Fl2 completed and bed requests sent in hub.    Expected Discharge Plan: Skilled Nursing Facility Barriers to Discharge: English as a second language teacher, Continued Medical Work up  Expected Discharge Plan and Services In-house Referral: Clinical Social Work   Post Acute Care Choice:  (TBD) Living arrangements for the past 2 months: Single Family Home                                       Social Determinants of Health (SDOH) Interventions SDOH Screenings   Food Insecurity: No Food Insecurity (06/29/2023)  Housing: Low Risk  (06/29/2023)  Transportation Needs: No Transportation Needs (06/29/2023)  Utilities: Not At Risk (06/29/2023)  Alcohol Screen: Low Risk  (04/28/2019)  Depression (PHQ2-9): Medium Risk (06/15/2023)  Financial Resource Strain: Low Risk  (02/03/2021)  Physical Activity: Inactive (04/28/2019)  Social Connections: Moderately Isolated (06/29/2023)  Stress: No Stress Concern Present (04/28/2019)  Tobacco Use: Low Risk  (07/21/2023)    Readmission Risk Interventions     No data to display

## 2023-07-22 NOTE — Progress Notes (Signed)
 Physical Therapy Treatment Patient Details Name: Darlene [name] MRN: [id number] DOB: January 21, 1941 Today's Date: 07/22/2023   History of Present Illness 83 yo female admitted 06/29/23 with acute hypoxic hypercapnic respiratory failure in the setting of aspiration complicated by GI bleed. Intubated 4/16-4/20. 4/17 EGD. Now s/p repair of Type III incarcerated paraesophageal hernia, gastropexy, gastrostomy tube placement, reduction of bowel from incarcerated RLQ hernia and LOA. PMhx: depression, DJD, OSA, pulmonary HTN, obesity, HLD, Afib.    PT Comments  Patient resting in bed at start and agreeable to mobilize with therapy, hopeful for OOB. Pt demonstrated good initiation for supine>sit wit use of bed rail and light assist to fully raise trunk and scoot to edge. Pt denied dizziness with sitting EOB and no significant drop noted with BP. Mod assist provided for sit<>stand with RW, and pt denied dizziness at start but reported onset after ~10 sec. Pt able to stand for ~1 minute but unable to maintain and returned to EOB. Significant drop in BP and elevation of HR noted in standing and pt less/minimally responsive once returned to EOB keeping eyes closed. Max assist required to return to supine and pt more alert and able to keep eyes open in bed. Bed placed in trendelenburg and pt able to use bil LE's and Lt UE to scoot superiorly in bed then placed in chair position, HOB at 60*, and BP stable. Discussed importance of chair position with pt and RN. Reviewed supine exercises for LE's. Will continue to progress pt as able during acute stay.    Orthostatic VS for the past 24 hrs:  BP- Lying Pulse- Lying BP- Sitting Pulse- Sitting BP- Standing at 0 minutes Pulse- Standing at 0 minutes  07/22/23 1120 106/60 92 100/63 (chair position in bed) 94 (chair position in bed) -- --  07/22/23 1109 119/62 81 104/62 97 (!) 76/55 130       If plan is discharge home, recommend the following: A lot of help with  bathing/dressing/bathroom;Assistance with cooking/housework;Direct supervision/assist for financial management;Assist for transportation;Two people to help with walking and/or transfers;Direct supervision/assist for medications management   Can travel by private vehicle     No  Equipment Recommendations  Hospital bed;Wheelchair (measurements PT);Hoyer lift    Recommendations for [employer]       Precautions / Restrictions Precautions Precautions: Fall Recall of Precautions/Restrictions: Impaired Precaution/Restrictions Comments: watch hypotension, SPO2, GT, incontinent Restrictions Weight Bearing Restrictions Per Provider Order: No     Mobility  Bed Mobility Overal bed mobility: Needs Assistance Bed Mobility: Rolling, Supine to Sit, Sit to Supine Rolling: Contact guard assist, Used rails   Supine to sit: Min assist, HOB elevated, Used rails Sit to supine: Max assist   General bed mobility comments: minA to LE and trunk, assist to complete supine>sit, light assist with good initiation to use bed rails for pivot. Max to return to supine due to symptomatic hypotension.    Transfers Overall transfer level: Needs assistance Equipment used: Rolling walker (2 wheels) Transfers: Sit to/from Stand Sit to Stand: Mod assist           General transfer comment: completed x1 with mod assist to RW. pt maintaining standing for ~1 minute during BP assessment, pt developed dizziness and returned to sitting EOB. Pt became less responsive but still answering therapist, returned to supine.    Ambulation/Gait                   Stairs  Wheelchair Mobility     Tilt Bed    Modified Rankin (Stroke Patients Only)       Balance Overall balance assessment: Needs assistance Sitting-balance support: Bilateral upper extremity supported, Feet supported Sitting balance-Leahy Scale: Fair Sitting balance - Comments: S initially at EOB though progressed to CGA  for balance as symptomatic with low BP on EOB   Standing balance support: Bilateral upper extremity supported, Reliant on assistive device for balance, During functional activity Standing balance-Leahy Scale: Poor Standing balance comment: BUE support and minA                            Communication Communication Communication: No apparent difficulties  Cognition Arousal: Alert Behavior During Therapy: WFL for tasks assessed/performed   PT - Cognitive impairments: No apparent impairments                         Following commands: Intact Following commands impaired: Only follows one step commands consistently    Cueing Cueing Techniques: Verbal cues, Gestural cues, Tactile cues  Exercises General Exercises - Lower Extremity Ankle Circles/Pumps: AROM, Both, 15 reps, Seated Short Arc Quad: AROM, Both, 10 reps Straight Leg Raises: AROM, Both, 10 reps    General Comments        Pertinent Vitals/Pain Pain Assessment Pain Assessment: Faces Faces Pain Scale: Hurts little more Pain Intervention(s): Limited activity within patient's tolerance, Monitored during session, Repositioned    Home Living                          Prior Function            PT Goals (current goals can now be found in the care plan section) Acute Rehab PT Goals Patient Stated Goal: return home PT Goal Formulation: With patient/family Time For Goal Achievement: 07/20/23 Potential to Achieve Goals: Fair Progress towards PT goals: Progressing toward goals    Frequency    Min 2X/week      PT Plan      Co-evaluation              AM-PAC PT "6 Clicks" Mobility   Outcome Measure  Help needed turning from your back to your side while in a flat bed without using bedrails?: A Little Help needed moving from lying on your back to sitting on the side of a flat bed without using bedrails?: A Lot Help needed moving to and from a bed to a chair (including a  wheelchair)?: Total Help needed standing up from a chair using your arms (e.g., wheelchair or bedside chair)?: Total Help needed to walk in hospital room?: Total Help needed climbing 3-5 steps with a railing? : Total 6 Click Score: 9    End of Session Equipment Utilized During Treatment: Gait belt Activity Tolerance: Treatment limited secondary to medical complications (Comment) (orthostatic hypotension) Patient left: in bed;with call bell/phone within reach;with bed alarm set;with family/visitor present Nurse Communication: Mobility status PT Visit Diagnosis: Unsteadiness on feet (R26.81);Other abnormalities of gait and mobility (R26.89);Muscle weakness (generalized) (M62.81)     Time: 3086-5784 PT Time Calculation (min) (ACUTE ONLY): 29 min  Charges:    $Therapeutic Activity: 23-37 mins PT General Charges $$ ACUTE PT VISIT: 1 Visit                     Tish Forge, DPT Acute Rehabilitation Services Office [phone]  07/22/23 1:10 PM

## 2023-07-23 DIAGNOSIS — K449 Diaphragmatic hernia without obstruction or gangrene: Secondary | ICD-10-CM | POA: Diagnosis not present

## 2023-07-23 DIAGNOSIS — I4891 Unspecified atrial fibrillation: Secondary | ICD-10-CM | POA: Diagnosis not present

## 2023-07-23 LAB — CBC
HCT: 32.7 % — ABNORMAL LOW (ref 36.0–46.0)
Hemoglobin: 10.3 g/dL — ABNORMAL LOW (ref 12.0–15.0)
MCH: 31.9 pg (ref 26.0–34.0)
MCHC: 31.5 g/dL (ref 30.0–36.0)
MCV: 101.2 fL — ABNORMAL HIGH (ref 80.0–100.0)
Platelets: 373 10*3/uL (ref 150–400)
RBC: 3.23 MIL/uL — ABNORMAL LOW (ref 3.87–5.11)
RDW: 14.2 % (ref 11.5–15.5)
WBC: 11.3 10*3/uL — ABNORMAL HIGH (ref 4.0–10.5)
nRBC: 0 % (ref 0.0–0.2)

## 2023-07-23 LAB — HEPARIN LEVEL (UNFRACTIONATED)
Heparin Unfractionated: 0.55 [IU]/mL (ref 0.30–0.70)
Heparin Unfractionated: 0.54 [IU]/mL (ref 0.30–0.70)

## 2023-07-23 LAB — GLUCOSE, CAPILLARY
Glucose-Capillary: 110 mg/dL — ABNORMAL HIGH (ref 70–99)
Glucose-Capillary: 119 mg/dL — ABNORMAL HIGH (ref 70–99)
Glucose-Capillary: 128 mg/dL — ABNORMAL HIGH (ref 70–99)
Glucose-Capillary: 131 mg/dL — ABNORMAL HIGH (ref 70–99)
Glucose-Capillary: 136 mg/dL — ABNORMAL HIGH (ref 70–99)
Glucose-Capillary: 142 mg/dL — ABNORMAL HIGH (ref 70–99)
Glucose-Capillary: 155 mg/dL — ABNORMAL HIGH (ref 70–99)

## 2023-07-23 LAB — BASIC METABOLIC PANEL WITH GFR
Anion gap: 11 (ref 5–15)
BUN: 27 mg/dL — ABNORMAL HIGH (ref 8–23)
CO2: 28 mmol/L (ref 22–32)
Calcium: 8.8 mg/dL — ABNORMAL LOW (ref 8.9–10.3)
Chloride: 95 mmol/L — ABNORMAL LOW (ref 98–111)
Creatinine, Ser: 0.75 mg/dL (ref 0.44–1.00)
GFR, Estimated: >60 mL/min (ref >60)
Glucose, Bld: 140 mg/dL — ABNORMAL HIGH (ref 70–99)
Potassium: 3.7 mmol/L (ref 3.5–5.1)
Sodium: 134 mmol/L — ABNORMAL LOW (ref 135–145)

## 2023-07-23 LAB — MAGNESIUM: Magnesium: 2 mg/dL (ref 1.7–2.4)

## 2023-07-23 MED ORDER — TRAZODONE HCL 50 MG PO TABS
50.0000 mg | ORAL_TABLET | Freq: Every day | ORAL | Status: DC
  Administered 2023-07-23 – 2023-07-28 (×6): 50 mg via ORAL
  Filled 2023-07-23 (×6): qty 1

## 2023-07-23 MED ORDER — FUROSEMIDE 10 MG/ML IJ SOLN
20.0000 mg | Freq: Every day | INTRAMUSCULAR | Status: DC
  Administered 2023-07-24 – 2023-07-28 (×5): 20 mg via INTRAVENOUS
  Filled 2023-07-23 (×5): qty 2

## 2023-07-23 MED ORDER — METOPROLOL TARTRATE 12.5 MG HALF TABLET
12.5000 mg | ORAL_TABLET | Freq: Two times a day (BID) | ORAL | Status: DC
  Administered 2023-07-23 – 2023-07-29 (×13): 12.5 mg via ORAL
  Filled 2023-07-23 (×13): qty 1

## 2023-07-23 NOTE — Progress Notes (Signed)
 PHARMACY - ANTICOAGULATION CONSULT NOTE  Pharmacy Consult for heparin  Indication: atrial fibrillation  Labs: Recent Labs    07/20/23 0503 07/21/23 0409 07/22/23 0351 07/22/23 1425 07/23/23 0100 07/23/23 0101  HGB 11.1* 11.4* 10.4*  --   --  10.3*  HCT 34.1* 36.0 32.0*  --   --  32.7*  PLT 469* 445* 369  --   --  373  APTT 66* 37* 74*  --   --   --   HEPARINUNFRC 0.71* 0.21* 0.35 0.15* 0.55  --   CREATININE  --   --  0.90  --   --   --    Assessment/Plan:  83yo female therapeutic on heparin  after rate change. Will continue infusion at current rate of 1750 units/hr and confirm stable with additional level.  Lonnie Roberts, PharmD, BCPS 07/23/2023 1:51 AM

## 2023-07-23 NOTE — Progress Notes (Signed)
 PROGRESS NOTE    Darlene [name]  HQI:[id number] DOB: 07/02/1940 DOA: 06/29/2023 PCP: Jenelle Mis, FNP   Brief Narrative:  Darlene [name] is a 83 y.o. F with PMH significant for atrial fibrillation on eliquis , OSA on CPAP, HTN, HLD, GERD and esophageal strictures.  She presents to our facility with ongoing coffee-ground emesis on 06/29/2023.  CT abdomen pelvis with contrast not revealing of any acute source of bleeding although noted high-grade stenosis of the celiac axis and collateral flow from the superior mesenteric artery-patient previously scheduled for hiatal hernia and spigelian hernia repair with Dr. Jamse Mcgee later this month.  Initially admitted for EGD planned for 06/30/2023, overnight noted to transition to A-fib with RVR with profound hypoxia during the event with noted pulmonary edema and large volume aspiration -initially placed on BiPAP with worsening respiratory status ultimately intubated for airway protection.  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  Ultimately extubated 07/04/23.  ICU delirium improving over the past week, respiratory status improving, echo showing moderate to severe right ventricular dysfunction.  Patient required transient pressors in the interim likely in setting of sepsis due to large volume aspiration pneumonia.  Patient weaned to nasal cannula, off pressors now transition to p.o. midodrine  and otherwise stable for transfer to hospital floor and out of the ICU.  Hospital course 4/15 admit with coffee ground emesis for inpatient EGD 4/16 afib with RVR, concern for pulm edema, PCCM consult and ICU txfr 4/17: ETT air leak, fixed with more balloon inflation. On Precedex  and Fentanyl  drips for sedation. Upper endoscopy-->Cameron ulcers on hiatal hernia.  A-fib rate controlled on amiodarone . 4/21 Complaints of epigastric pain vs delirium overnight, still on amio gtt/heparin . Tube feeds paused this early this morning.    4/22 Delirium improving, patient had BM, Pulmonary Edema vs BL infiltrates on CXR-, CVP 18 -diuresed with lasix  and metolazone  4/24 Diuresed 3.5 L with metolazone /Lasix  4/26 Placed on BiPAP for increased WOB 4/27 Point-of-care echo showed moderate to severe RV dysfunction.  LV function appears normal.  There is consolidation at both bases with a trivial effusion unsuitable for thoracentesis.  Started on norepinephrine  to support blood pressure as patient required Precedex  for delirium, which exacerbated hypotension 4/28 thoracentesis. Right 100 ml. Exudate by lights. Likely parapneumonic  4/29 off norepi PCXR improved. WBC nml pct neg stopped abx. Feeling better  4/30 weaned down to 4L [place] - transition to [facility]   Assessment & Plan:  Acute hypoxic and hypercapnic respiratory failure Aspiration pneumonia in setting of large hiatal hernia Complicated by volume overload/parapneumonic pleural effusion - Completed antibiotic course for aspiration pneumonia coverage Seems to have been weaned off of oxygen.  Saturations are in the mid 90s on room air. - PT OT following, out of bed to chair as tolerated Patient remains on IV diuretics, furosemide  40 mg twice a day.  Monitor electrolytes.  Ins and outs and daily weights.   Respiratory status is stable.  Acute versus subacute GI bleed Hiatal hernia with oozing Darlene [name] erosions -Seen by gastroenterology.  Underwent EGD 4/17 confirms Cameron ulcers on hiatal hernia General Surgery was subsequently consulted. Patient underwent repair of the incarcerated paraesophageal hernia.  Also underwent gastrostomy tube placement and reduction of bowel from the incarcerated right lower quadrant hernia. Started on tube feedings by general surgery.  Seems to be tolerating it well.  Overall diet has been advanced as well.  Hypotension, improving Questionably iatrogenic versus septic shock given above Orthostatic hypotension ongoing Required  vasopressors in the  ICU. Currently on midodrine .  Continue to monitor.  Blood pressure is stable.   A-fib with RVR On Eliquis  at baseline.  Currently on IV heparin . Patient noted to be on amiodarone .  Required Cardizem  infusion in the OR on 5/7.  Telemetry shows atrial fibrillation with heart rate between 90-120.  Patient is asymptomatic.  Since blood pressure is otherwise well-controlled we could try low-dose metoprolol .  Await input from general surgery regarding transitioning back to Eliquis .  Hypokalemia Potassium supplemented.  Magnesium  is 2.0.     Severe deconditioning Ambulatory dysfunction PT and OT following.  SNF is recommended for short-term rehab.   Hyperglycemia without history of diabetes Continue sliding scale insulin , hypoglycemic protocol A1c previously within normal limits 5.6  DVT prophylaxis: Currently on IV heparin .  Transition back to Eliquis  when cleared to do so by general surgery. Code Status:   Code Status: Full Code Family Communication: No family at bedside. Disposition: SNF for short-term rehab   Consultants:  PCCM, GI, general surgery  Antimicrobials:  Previously completed  Subjective: Feels well this morning.  Denies any shortness of breath chest pain nausea or vomiting.  Objective: Vitals:   07/22/23 1700 07/22/23 2100 07/23/23 0621 07/23/23 0831  BP: (!) 102/56 (!) 117/58 105/60 126/76  Pulse: 99  (!) 103 98  Resp: 18 18 18 15   Temp: 98 F (36.7 C) 97.9 F (36.6 C) 98.3 F (36.8 C) 98.3 F (36.8 C)  TempSrc: Oral Oral  Oral  SpO2:  97% 93% 94%  Weight:      Height:        Intake/Output Summary (Last 24 hours) at 07/23/2023 1006 Last data filed at 07/23/2023 0646 Gross per 24 hour  Intake 499.93 ml  Output 1500 ml  Net -1000.07 ml   Filed Weights   07/05/23 0522 07/11/23 0500 07/19/23 1547  Weight: 98 kg 96.5 kg 97.5 kg    Examination:  General appearance: Awake alert.  In no distress Resp: Clear to auscultation bilaterally.  Normal  effort Cardio: S1-S2 is irregularly irregular GI: Abdominal binder noted.  Abdominal exam deferred to general surgery. No pedal edema.  Data Reviewed:   CBC: Recent Labs  Lab 07/19/23 0450 07/20/23 0503 07/21/23 0409 07/22/23 0351 07/23/23 0101  WBC 11.5* 9.8 10.7* 12.1* 11.3*  HGB 11.3* 11.1* 11.4* 10.4* 10.3*  HCT 35.1* 34.1* 36.0 32.0* 32.7*  MCV 98.6 99.4 98.6 100.9* 101.2*  PLT 525* 469* 445* 369 373   Basic Metabolic Panel: Recent Labs  Lab 07/19/23 0450 07/22/23 0351 07/23/23 0101  NA 138 139 134*  K 3.1* 3.1* 3.7  CL 96* 99 95*  CO2 35* 30 28  GLUCOSE 133* 152* 140*  BUN 27* 25* 27*  CREATININE 0.70 0.90 0.75  CALCIUM 9.1 9.3 8.8*  MG  --  1.9 2.0   GFR: Estimated Creatinine Clearance: 67.4 mL/min (by C-G formula based on SCr of 0.75 mg/dL).  CBG: Recent Labs  Lab 07/22/23 2007 07/22/23 2114 07/23/23 0013 07/23/23 0411 07/23/23 0849  GLUCAP 64* 110* 142* 131* 155*      Radiology Studies: No results found.  Scheduled Meds:  acetaminophen  (TYLENOL ) oral liquid 160 mg/5 mL  650 mg Per Tube Q6H   amiodarone   200 mg Per Tube Daily   busPIRone   5 mg Per Tube TID   Chlorhexidine  Gluconate Cloth  6 each Topical Q0600   docusate  100 mg Per Tube BID   escitalopram   20 mg Per Tube Daily  feeding supplement (PROSource TF20)  60 mL Per Tube Daily   free water   100 mL Per Tube Q4H   furosemide   40 mg Intravenous BID   insulin  aspart  0-20 Units Subcutaneous Q4H   lidocaine   1 patch Transdermal Q24H   metoCLOPramide  (REGLAN ) injection  10 mg Intravenous Q6H   metoprolol  tartrate  12.5 mg Oral BID   midodrine   10 mg Per Tube Q8H   mouth rinse  15 mL Mouth Rinse 4 times per day   pantoprazole  (PROTONIX ) IV  40 mg Intravenous Q12H   polyethylene glycol  17 g Per Tube Daily   sodium chloride  flush  10-40 mL Intracatheter Q12H   traZODone  50 mg Oral QHS   Continuous Infusions:  feeding supplement (VITAL 1.5 CAL) 45 mL/hr at 07/22/23 2000   heparin   1,750 Units/hr (07/23/23 0552)     LOS: 23 days     Maylene Spear,  [facility]  If 7PM-7AM, please contact night-coverage [email or web address]  07/23/2023, 10:06 AM

## 2023-07-23 NOTE — Progress Notes (Signed)
   07/23/23 1955  BiPAP/CPAP/SIPAP  BiPAP/CPAP/SIPAP Pt Type Adult  Reason BIPAP/CPAP not in use Non-compliant  BiPAP/CPAP /SiPAP Vitals  Pulse Rate 99  Resp (!) 23  SpO2 93 %  Bilateral Breath Sounds Clear  MEWS Score/Color  MEWS Score 1  MEWS Score Color Green

## 2023-07-23 NOTE — Progress Notes (Signed)
 Occupational Therapy Treatment Patient Details Name: Darlene [name] MRN: [id number] DOB: 1941/02/10 Today's Date: 07/23/2023   History of present illness 83 yo female admitted 06/29/23 with acute hypoxic hypercapnic respiratory failure in the setting of aspiration complicated by GI bleed. Intubated 4/16-4/20. 4/17 EGD. Now s/p repair of Type III incarcerated paraesophageal hernia, gastropexy, gastrostomy tube placement, reduction of bowel from incarcerated RLQ hernia and LOA. PMhx: depression, DJD, OSA, pulmonary HTN, obesity, HLD, Afib.   OT comments  Pt supine in bed and agreeable to OT/PT session. She requires min assist +2 safety for transfers, reports lightheaded in sitting but moment of "going dark" in standing which improved after sitting.  Patient continues to requires setup for UB ADLs and max -total assist for LB Adls.  Limited tolerance and generalized weakness. Continue to recommend <3hrs/day inpatient setting at dc.   BP supine 99/67  EOB 85/46 (abd binder on)  EOB 100/72 (abd binder and ace wraps to legs) EOB 93/66 (sustained sitting)  Standing to sit 91/77  Reclined legs elevated 101/57        If plan is discharge home, recommend the following:  Two people to help with walking and/or transfers;A lot of help with bathing/dressing/bathroom;Assistance with cooking/housework;Assist for transportation;Help with stairs or ramp for entrance;Direct supervision/assist for medications management;Direct supervision/assist for financial management   Equipment Recommendations  BSC/3in1    Recommendations for Other Services      Precautions / Restrictions Precautions Precautions: Fall Recall of Precautions/Restrictions: Impaired Precaution/Restrictions Comments: watch hypotension, SPO2, GT, incontinent Restrictions Weight Bearing Restrictions Per Provider Order: No       Mobility Bed Mobility Overal bed mobility: Needs Assistance Bed Mobility: Rolling, Sidelying to  Sit Rolling: Contact guard assist, Used rails Sidelying to sit: Min assist, HOB elevated, Used rails            Transfers Overall transfer level: Needs assistance Equipment used: Rolling walker (2 wheels) Transfers: Sit to/from Stand, Bed to chair/wheelchair/BSC Sit to Stand: Min assist, From elevated surface, +2 safety/equipment     Step pivot transfers: Min assist, +2 safety/equipment           Balance Overall balance assessment: Needs assistance Sitting-balance support: Bilateral upper extremity supported, Feet supported Sitting balance-Leahy Scale: Fair Sitting balance - Comments: S initially at EOB though progressed to CGA for balance as symptomatic with low BP on EOB   Standing balance support: Bilateral upper extremity supported, Reliant on assistive device for balance, During functional activity Standing balance-Leahy Scale: Poor Standing balance comment: BUE support and minA                           ADL either performed or assessed with clinical judgement   ADL Overall ADL's : Needs assistance/impaired     Grooming: Set up;Sitting           Upper Body Dressing : Minimal assistance;Sitting   Lower Body Dressing: Total assistance;Sit to/from stand;+2 for physical assistance   Toilet Transfer: Minimal assistance;+2 for safety/equipment;Rolling walker (2 wheels)           Functional mobility during ADLs: Minimal assistance;+2 for safety/equipment;Rolling walker (2 wheels)      Extremity/Trunk Assessment              Vision       Perception     Praxis     Communication Communication Communication: No apparent difficulties   Cognition Arousal: Alert Behavior During Therapy: WFL for tasks assessed/performed Cognition: No  family/caregiver present to determine baseline             OT - Cognition Comments: some slow processing and decreased probelm solving noted.                 Following commands:  Intact Following commands impaired: Follows multi-step commands inconsistently, Follows multi-step commands with increased time      Cueing   Cueing Techniques: Verbal cues, Gestural cues, Tactile cues  Exercises      Shoulder Instructions       General Comments pt mobilizing better with BP sustaining with BLES ace wrapped and using abd binder.  Standing in front of recliner pt reports feeling "things go dark" and assisted into sitting. BP stable.    Pertinent Vitals/ Pain       Pain Assessment Pain Assessment: Faces Faces Pain Scale: Hurts little more Pain Location: abdomen Pain Descriptors / Indicators: Discomfort, Grimacing Pain Intervention(s): Limited activity within patient's tolerance, Monitored during session, Repositioned  Home Living                                          Prior Functioning/Environment              Frequency  Min 2X/week        Progress Toward Goals  OT Goals(current goals can now be found in the care plan section)  Progress towards OT goals: Progressing toward goals  Acute Rehab OT Goals Patient Stated Goal: get better OT Goal Formulation: With patient Time For Goal Achievement: 08/06/23 Potential to Achieve Goals: Good ADL Goals Pt Will Perform Grooming: with contact guard assist;sitting;standing Pt Will Perform Upper Body Dressing: with supervision;with set-up;sitting Pt Will Perform Lower Body Dressing: with min assist;with adaptive equipment;sitting/lateral leans;sit to/from stand Pt Will Transfer to Toilet: with min assist;ambulating;bedside commode;stand pivot transfer Additional ADL Goal #1: Pt will tolerate standing activity for 2 minutes in order to improve activity tolerance for ADLs.  Plan      Co-evaluation    PT/OT/SLP Co-Evaluation/Treatment: Yes Reason for Co-Treatment: For patient/therapist safety;To address functional/ADL transfers PT goals addressed during session: Mobility/safety with  mobility;Balance;Proper use of DME OT goals addressed during session: Strengthening/ROM;ADL's and self-care      AM-PAC OT "6 Clicks" Daily Activity     Outcome Measure   Help from another person eating meals?: A Little Help from another person taking care of personal grooming?: A Little Help from another person toileting, which includes using toliet, bedpan, or urinal?: A Lot Help from another person bathing (including washing, rinsing, drying)?: A Lot Help from another person to put on and taking off regular upper body clothing?: A Little Help from another person to put on and taking off regular lower body clothing?: Total 6 Click Score: 14    End of Session Equipment Utilized During Treatment: Gait belt;Rolling walker (2 wheels)  OT Visit Diagnosis: Unsteadiness on feet (R26.81);Other abnormalities of gait and mobility (R26.89);Muscle weakness (generalized) (M62.81)   Activity Tolerance Patient tolerated treatment well   Patient Left in chair;with call bell/phone within reach;with chair alarm set   Nurse Communication Mobility status        Time: 3875-6433 OT Time Calculation (min): 27 min  Charges: OT General Charges $OT Visit: 1 Visit OT Treatments $Self Care/Home Management : 8-22 mins  Bary Boss, OT Acute Rehabilitation Services Office [phone] Secure Chat Preferred  Fredrich Jefferson 07/23/2023, 2:00 PM

## 2023-07-23 NOTE — Progress Notes (Signed)
 PHARMACY - ANTICOAGULATION Pharmacy Consult for heparin   Indication: atrial fibrillation  Allergies  Allergen Reactions   Cephalosporins Rash    Tolerated ceftriaxone  06/2023   Keflex [Cephalexin] Rash    Patient Measurements: Height: 5\' 10"  (177.8 cm) Weight: 97.5 kg (214 lb 15.2 oz) IBW/kg (Calculated) : 68.5 HEPARIN  DW (KG): 91  Vital Signs: Temp: 98.3 F (36.8 C) (05/09 0831) Temp Source: Oral (05/09 0831) BP: 126/76 (05/09 0831) Pulse Rate: 98 (05/09 0831)  Labs: Recent Labs    07/21/23 0409 07/22/23 0351 07/22/23 1425 07/23/23 0100 07/23/23 0101 07/23/23 1032  HGB 11.4* 10.4*  --   --  10.3*  --   HCT 36.0 32.0*  --   --  32.7*  --   PLT 445* 369  --   --  373  --   APTT 37* 74*  --   --   --   --   HEPARINUNFRC 0.21* 0.35 0.15* 0.55  --  0.54  CREATININE  --  0.90  --   --  0.75  --     Estimated Creatinine Clearance: 67.4 mL/min (by C-G formula based on SCr of 0.75 mg/dL).   Assessment: 83 YO female with PMH significant for atrial fibrillation on Eliquis  admitted for hiatal hernia with oozing erosions leaking to UGIB. Plan operative repair currently scheduled for 5/07. Apixaban  now held, last dose 5/01 @ 2200. Pharmacy consulted to initiate IV heparin  in interim.   Heparin  level therapeutic at 0.54 on 1750 units/hr. Hgb stable 10s, platelets are normal. No bleeding noted. Confirmed with RN heparin  infusing through PIV.  Goal of Therapy:  Heparin  level 0.3-0.7 units/ml Monitor platelets by anticoagulation protocol: Yes   Plan:  Continue heparin  infusion at 1750 units/hr  Daily heparin  level and CBC Monitor for s/sx of bleeding  Thank you for involving pharmacy in this patient's care.  Caroline Cinnamon, PharmD, BCPS Clinical Pharmacist Clinical phone for 07/23/2023 is [phone] 07/23/2023 11:44 AM

## 2023-07-23 NOTE — Inpatient Diabetes Management (Signed)
 Inpatient Diabetes Program Recommendations  AACE/ADA: New Consensus Statement on Inpatient Glycemic Control (2015)  Target Ranges:  Prepandial:   less than 140 mg/dL      Peak postprandial:   less than 180 mg/dL (1-2 hours)      Critically ill patients:  140 - 180 mg/dL   Lab Results  Component Value Date   GLUCAP 155 (H) 07/23/2023   HGBA1C 5.6 02/16/2023    Review of Glycemic Control  Latest Reference Range & Units 07/22/23 05:02 07/22/23 09:42 07/22/23 11:51 07/22/23 16:55 07/22/23 20:07 07/22/23 21:14 07/23/23 00:13 07/23/23 04:11 07/23/23 08:49  Glucose-Capillary 70 - 99 mg/dL 846 (H) 962 (H) 90 952 (H) 64 (L) 110 (H) 142 (H) 131 (H) 155 (H)   Diabetes history: None Current orders for Inpatient glycemic control:  Novolog  0-20 units Q4 hours  Note: Hypoglycemia yesterday due to Novolog  4 units given per resistant sliding scale. Due to pt sensitivity consider decreasing Novolog . Pt has not received insulin  since yesterday hypo and trends have looked good.  Inpatient Diabetes Program Recommendations:   -   Consider reducing Novolog  to 0-6 units Q4 hours starting at 151  Thanks,  Eloise Hake RN, MSN, BC-ADM Inpatient Diabetes Coordinator Team Pager [phone] (8a-5p)

## 2023-07-23 NOTE — Progress Notes (Addendum)
 Physical Therapy Treatment Patient Details Name: Darlene [name] MRN: [id number] DOB: 1940/07/10 Today's Date: 07/23/2023   History of Present Illness 83 yo female admitted 06/29/23 with acute hypoxic hypercapnic respiratory failure in the setting of aspiration complicated by GI bleed. Intubated 4/16-4/20. 4/17 EGD. Now s/p repair of Type III incarcerated paraesophageal hernia, gastropexy, gastrostomy tube placement, reduction of bowel from incarcerated RLQ hernia and LOA. PMhx: depression, DJD, OSA, pulmonary HTN, obesity, HLD, Afib.    PT Comments  Patient resting in bed and agreeable to mobilize with therapy. Discussed goal of BP management with abdominal binder and ace wraps to LE's (ted hose not in room). Pt demonstrated good initiation for rolling and side>sit EOB with min assist to raise trunk. BP dropped but stabilized with ace wraps applied to LE's in sitting. Min +2 for safety with Sit<>stand and steps with RW to move bed>chair, pt denied dizziness initially but they reported "getting weak" "its getting dark" and returned to sit in recliner. LE's elevated and BP stable. Will continue to progress pt as able. Encouraged time OOB and upright in bed chair position. Patient will benefit from continued inpatient follow up therapy, <3 hours/day.    Orthostatic VS for the past 24 hrs:  BP- Lying Pulse- Lying BP- Sitting Pulse- Sitting BP- Standing at 0 minutes Pulse- Standing at 0 minutes  07/23/23 1108 -- -- -- -- 101/57 86  07/23/23 1100 -- -- 93/66 111 91/77 110  07/23/23 1059 -- -- 100/72 89 -- --  07/23/23 1049 99/67 86 (!) 85/46 88 -- --       If plan is discharge home, recommend the following: A lot of help with bathing/dressing/bathroom;Assistance with cooking/housework;Direct supervision/assist for financial management;Assist for transportation;Two people to help with walking and/or transfers;Direct supervision/assist for medications management   Can travel by private vehicle     No   Equipment Recommendations  Hospital bed;Wheelchair (measurements PT);Hoyer lift    Recommendations for Other Services       Precautions / Restrictions Precautions Precautions: Fall Recall of Precautions/Restrictions: Impaired Precaution/Restrictions Comments: watch hypotension, SPO2, GT, incontinent Restrictions Weight Bearing Restrictions Per Provider Order: No     Mobility  Bed Mobility Overal bed mobility: Needs Assistance Bed Mobility: Rolling, Sidelying to Sit Rolling: Contact guard assist, Used rails Sidelying to sit: Min assist, HOB elevated, Used rails       General bed mobility comments: pt with good effort to roll initiating flexing LE's and reaching for bed rail. Min assist to fully raise trunk upright then CGA for safety at EOB.    Transfers Overall transfer level: Needs assistance Equipment used: Rolling walker (2 wheels) Transfers: Sit to/from Stand, Bed to chair/wheelchair/BSC Sit to Stand: Min assist, From elevated surface, +2 safety/equipment   Step pivot transfers: Min assist, +2 safety/equipment       General transfer comment: Min assist for power up to RW from EOB, +2 for safety with pivot bed>chair, pt denied immediate dizziness on stand and transfer but after ~30 seconds reported increased dizziness and sat in recliner.    Ambulation/Gait               General Gait Details: unable to progress beyond SPT and marching due to dizziness.   Stairs             Wheelchair Mobility     Tilt Bed    Modified Rankin (Stroke Patients Only)       Balance Overall balance assessment: Needs assistance Sitting-balance support: Bilateral upper extremity  supported, Feet supported Sitting balance-Leahy Scale: Fair Sitting balance - Comments: S initially at EOB though progressed to CGA for balance as symptomatic with low BP on EOB   Standing balance support: Bilateral upper extremity supported, Reliant on assistive device for balance,  During functional activity Standing balance-Leahy Scale: Poor Standing balance comment: BUE support and minA                            Communication Communication Communication: No apparent difficulties  Cognition Arousal: Alert Behavior During Therapy: WFL for tasks assessed/performed   PT - Cognitive impairments: No apparent impairments                         Following commands: Intact Following commands impaired: Follows multi-step commands inconsistently, Follows multi-step commands with increased time    Cueing Cueing Techniques: Verbal cues, Gestural cues, Tactile cues  Exercises General Exercises - Lower Extremity Hip Flexion/Marching: AROM, Both, Standing, Limitations, Seated Hip Flexion/Marching Limitations: in place when sitting EOB and standing post transfer    General Comments General comments (skin integrity, edema, etc.): pt mobilizing better with BP sustaining with BLES ace wrapped and using abd binder.  Standing in front of recliner pt reports feeling "things go dark" and assisted into sitting. BP stable.      Pertinent Vitals/Pain Pain Assessment Pain Assessment: Faces Faces Pain Scale: Hurts little more Pain Location: abdomen Pain Descriptors / Indicators: Discomfort, Grimacing Pain Intervention(s): Limited activity within patient's tolerance, Monitored during session, Repositioned    Home Living                          Prior Function            PT Goals (current goals can now be found in the care plan section) Acute Rehab PT Goals Patient Stated Goal: return home PT Goal Formulation: With patient/family Time For Goal Achievement: 07/20/23 Potential to Achieve Goals: Fair Progress towards PT goals: Progressing toward goals    Frequency    Min 2X/week      PT Plan      Co-evaluation PT/OT/SLP Co-Evaluation/Treatment: Yes Reason for Co-Treatment: For patient/therapist safety;To address functional/ADL  transfers PT goals addressed during session: Mobility/safety with mobility;Balance;Proper use of DME OT goals addressed during session: Strengthening/ROM;ADL's and self-care      AM-PAC PT "6 Clicks" Mobility   Outcome Measure  Help needed turning from your back to your side while in a flat bed without using bedrails?: A Little Help needed moving from lying on your back to sitting on the side of a flat bed without using bedrails?: A Lot Help needed moving to and from a bed to a chair (including a wheelchair)?: Total Help needed standing up from a chair using your arms (e.g., wheelchair or bedside chair)?: Total Help needed to walk in hospital room?: Total Help needed climbing 3-5 steps with a railing? : Total 6 Click Score: 9    End of Session Equipment Utilized During Treatment: Gait belt Activity Tolerance: Treatment limited secondary to medical complications (Comment) (orthostatic hypotension) Patient left: in bed;with call bell/phone within reach;with bed alarm set;with family/visitor present Nurse Communication: Mobility status PT Visit Diagnosis: Unsteadiness on feet (R26.81);Other abnormalities of gait and mobility (R26.89);Muscle weakness (generalized) (M62.81)     Time: 4098-1191 PT Time Calculation (min) (ACUTE ONLY): 26 min  Charges:    $Therapeutic Activity: 8-22 mins PT  General Charges $$ ACUTE PT VISIT: 1 Visit                     Tish Forge, DPT Acute Rehabilitation Services Office [phone]  07/23/23 3:11 PM

## 2023-07-24 DIAGNOSIS — I4891 Unspecified atrial fibrillation: Secondary | ICD-10-CM | POA: Diagnosis not present

## 2023-07-24 DIAGNOSIS — K449 Diaphragmatic hernia without obstruction or gangrene: Secondary | ICD-10-CM | POA: Diagnosis not present

## 2023-07-24 LAB — CBC
HCT: 31.9 % — ABNORMAL LOW (ref 36.0–46.0)
Hemoglobin: 10.2 g/dL — ABNORMAL LOW (ref 12.0–15.0)
MCH: 32.2 pg (ref 26.0–34.0)
MCHC: 32 g/dL (ref 30.0–36.0)
MCV: 100.6 fL — ABNORMAL HIGH (ref 80.0–100.0)
Platelets: 308 10*3/uL (ref 150–400)
RBC: 3.17 MIL/uL — ABNORMAL LOW (ref 3.87–5.11)
RDW: 14.3 % (ref 11.5–15.5)
WBC: 8.6 10*3/uL (ref 4.0–10.5)
nRBC: 0 % (ref 0.0–0.2)

## 2023-07-24 LAB — GLUCOSE, CAPILLARY
Glucose-Capillary: 114 mg/dL — ABNORMAL HIGH (ref 70–99)
Glucose-Capillary: 115 mg/dL — ABNORMAL HIGH (ref 70–99)
Glucose-Capillary: 130 mg/dL — ABNORMAL HIGH (ref 70–99)
Glucose-Capillary: 150 mg/dL — ABNORMAL HIGH (ref 70–99)
Glucose-Capillary: 170 mg/dL — ABNORMAL HIGH (ref 70–99)
Glucose-Capillary: 93 mg/dL (ref 70–99)

## 2023-07-24 LAB — HEPARIN LEVEL (UNFRACTIONATED): Heparin Unfractionated: 0.55 [IU]/mL (ref 0.30–0.70)

## 2023-07-24 NOTE — Plan of Care (Signed)
  Problem: Education: Goal: Knowledge of General Education information will improve Description: Including pain rating scale, medication(s)/side effects and non-pharmacologic comfort measures Outcome: Progressing   Problem: Clinical Measurements: Goal: Respiratory complications will improve Outcome: Progressing   Problem: Coping: Goal: Level of anxiety will decrease Outcome: Progressing   Problem: Elimination: Goal: Will not experience complications related to bowel motility Outcome: Progressing Goal: Will not experience complications related to urinary retention Outcome: Progressing   Problem: Pain Managment: Goal: General experience of comfort will improve and/or be controlled Outcome: Progressing   Problem: Safety: Goal: Ability to remain free from injury will improve Outcome: Progressing

## 2023-07-24 NOTE — Progress Notes (Signed)
 PHARMACY - ANTICOAGULATION Pharmacy Consult for heparin   Indication: atrial fibrillation  Allergies  Allergen Reactions   Cephalosporins Rash    Tolerated ceftriaxone  06/2023   Keflex [Cephalexin] Rash    Patient Measurements: Height: 5\' 10"  (177.8 cm) Weight: 97.5 kg (214 lb 15.2 oz) IBW/kg (Calculated) : 68.5 HEPARIN  DW (KG): 91  Vital Signs: Temp: 98.8 F (37.1 C) (05/10 0407) Temp Source: Axillary (05/10 0407) BP: 95/55 (05/10 0407) Pulse Rate: 86 (05/10 0407)  Labs: Recent Labs    07/22/23 0351 07/22/23 1425 07/23/23 0100 07/23/23 0101 07/23/23 1032 07/24/23 0445  HGB 10.4*  --   --  10.3*  --  10.2*  HCT 32.0*  --   --  32.7*  --  31.9*  PLT 369  --   --  373  --  308  APTT 74*  --   --   --   --   --   HEPARINUNFRC 0.35   < > 0.55  --  0.54 0.55  CREATININE 0.90  --   --  0.75  --   --    < > = values in this interval not displayed.    Estimated Creatinine Clearance: 67.4 mL/min (by C-G formula based on SCr of 0.75 mg/dL).   Assessment: 83 YO female with PMH significant for atrial fibrillation on Eliquis  admitted for hiatal hernia with oozing erosions leaking to UGIB. Plan operative repair currently scheduled for 5/07. Apixaban  now held, last dose 5/01 @ 2200. Pharmacy consulted to initiate IV heparin  in interim.   Heparin  level therapeutic at 0.54 on 1750 units/hr. Hgb stable 10s, platelets are normal. No bleeding noted. Confirmed with RN heparin  infusing through PIV.  5/10 AM update: HL 0.55 PLT wnl Hgb 10.2- stable  Goal of Therapy:  Heparin  level 0.3-0.7 units/ml Monitor platelets by anticoagulation protocol: Yes   Plan:  Continue heparin  infusion at 1750 units/hr  Daily heparin  level and CBC Monitor for s/sx of bleeding  Thank you for involving pharmacy in this patient's care.  Marleta Simmer BS, PharmD, BCPS Clinical Pharmacist 07/24/2023 7:45 AM  Contact: [phone] after 3 PM  "Be curious, not judgmental..." -Rumalda Counter

## 2023-07-24 NOTE — Plan of Care (Signed)

## 2023-07-24 NOTE — Progress Notes (Signed)
 PROGRESS NOTE    [name]  FAO:[id number] DOB: 1940/10/30 DOA: 06/29/2023 PCP: Jenelle Mis, FNP   Brief Narrative:  Darlene [name] is a 83 y.o. F with PMH significant for atrial fibrillation on eliquis , OSA on CPAP, HTN, HLD, GERD and esophageal strictures.  She presents to our facility with ongoing coffee-ground emesis on 06/29/2023.  CT abdomen pelvis with contrast not revealing of any acute source of bleeding although noted high-grade stenosis of the celiac axis and collateral flow from the superior mesenteric artery-patient previously scheduled for hiatal hernia and spigelian hernia repair with Dr. Jamse Mcgee later this month.  Initially admitted for EGD planned for 06/30/2023, overnight noted to transition to A-fib with RVR with profound hypoxia during the event with noted pulmonary edema and large volume aspiration -initially placed on BiPAP with worsening respiratory status ultimately intubated for airway protection.  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  Ultimately extubated 07/04/23.  ICU delirium improving over the past week, respiratory status improving, echo showing moderate to severe right ventricular dysfunction.  Patient required transient pressors in the interim likely in setting of sepsis due to large volume aspiration pneumonia.  Patient weaned to nasal cannula, off pressors now transition to p.o. midodrine  and otherwise stable for transfer to hospital floor and out of the ICU.  Hospital course 4/15 admit with coffee ground emesis for inpatient EGD 4/16 afib with RVR, concern for pulm edema, PCCM consult and ICU txfr 4/17: ETT air leak, fixed with more balloon inflation. On Precedex  and Fentanyl  drips for sedation. Upper endoscopy-->Cameron ulcers on hiatal hernia.  A-fib rate controlled on amiodarone . 4/21 Complaints of epigastric pain vs delirium overnight, still on amio gtt/heparin . Tube feeds paused this early this morning.    4/22 Delirium improving, patient had BM, Pulmonary Edema vs BL infiltrates on CXR-, CVP 18 -diuresed with lasix  and metolazone  4/24 Diuresed 3.5 L with metolazone /Lasix  4/26 Placed on BiPAP for increased WOB 4/27 Point-of-care echo showed moderate to severe RV dysfunction.  LV function appears normal.  There is consolidation at both bases with a trivial effusion unsuitable for thoracentesis.  Started on norepinephrine  to support blood pressure as patient required Precedex  for delirium, which exacerbated hypotension 4/28 thoracentesis. Right 100 ml. Exudate by lights. Likely parapneumonic  4/29 off norepi PCXR improved. WBC nml pct neg stopped abx. Feeling better  4/30 weaned down to 4L [place] - transition to [facility]   Assessment & Plan:  Acute hypoxic and hypercapnic respiratory failure Aspiration pneumonia in setting of large hiatal hernia Complicated by volume overload/parapneumonic pleural effusion - Completed antibiotic course for aspiration pneumonia coverage Seems to have been weaned off of oxygen.  Saturations are in the mid 90s on room air. - PT OT following, out of bed to chair as tolerated Echocardiogram from April showed LVEF of 50 to 55% with LVH.  Diastolic function could not be assessed. Patient remains on IV diuretics, furosemide  40 mg twice a day.  Has been in negative fluid balance.  Monitor electrolytes periodically. Respiratory status is stable.    Acute versus subacute GI bleed Hiatal hernia with oozing [name] erosions -Seen by gastroenterology.  Underwent EGD 4/17 confirms Cameron ulcers on hiatal hernia General Surgery was subsequently consulted. Patient underwent repair of the incarcerated paraesophageal hernia.  Also underwent gastrostomy tube placement and reduction of bowel from the incarcerated right lower quadrant hernia. Started on tube feedings by general surgery.  Reports an episode of vomiting earlier this morning.  Denies any abdominal pain which is more than  usual. Noted to have Foley catheter.  Usually does not have indwelling Foley catheter.  Could attempt voiding trial.  Will do so in the next 24 to 48 hours.  Hypotension, improving Questionably iatrogenic versus septic shock given above Orthostatic hypotension ongoing Required vasopressors in the ICU. Currently on midodrine .  Continue to monitor.  Blood pressure is stable.   A-fib with RVR On Eliquis  at baseline.  Currently on IV heparin . Patient noted to be on amiodarone .  Required Cardizem  infusion in the OR on 5/7.  Telemetry shows atrial fibrillation with heart rate between 90-120.  Heart rate noted to be elevated this morning.  Has not been giving her amiodarone  and metoprolol  yet.  Continue to monitor on telemetry for now. Transition to Eliquis  later today if she does not have any further nausea and vomiting.  Hypokalemia Potassium supplemented.  Magnesium  is 2.0.  Recheck labs tomorrow.   Severe deconditioning Ambulatory dysfunction PT and OT following.  SNF is recommended for short-term rehab.   Hyperglycemia without history of diabetes Continue sliding scale insulin , hypoglycemic protocol A1c previously within normal limits 5.6  DVT prophylaxis: Currently on IV heparin .  Transition back to Eliquis  later today or tomorrow. Code Status:   Code Status: Full Code Family Communication: No family at bedside. Disposition: SNF for short-term rehab when medically stable   Consultants:  PCCM, GI, general surgery  Antimicrobials:  Previously completed  Subjective: Complains of nausea this morning.  Had an episode of vomiting earlier today.  Eating her breakfast this morning.  No chest pain or shortness of breath.  Objective: Vitals:   07/23/23 2002 07/23/23 2217 07/24/23 0407 07/24/23 0824  BP: (!) 106/57 (!) 106/57 (!) 95/55 116/64  Pulse: (!) 108 (!) 108 86 (!) 110  Resp: 17   15  Temp: 98.2 F (36.8 C)  98.8 F (37.1 C) 97.6 F (36.4 C)  TempSrc: Oral  Axillary  Oral  SpO2: 95%  93% 96%  Weight:      Height:        Intake/Output Summary (Last 24 hours) at 07/24/2023 0924 Last data filed at 07/24/2023 0434 Gross per 24 hour  Intake 3742.1 ml  Output 1750 ml  Net 1992.1 ml   Filed Weights   07/05/23 0522 07/11/23 0500 07/19/23 1547  Weight: 98 kg 96.5 kg 97.5 kg    Examination:  General appearance: Awake alert.  In no distress Resp: Clear to auscultation bilaterally.  Normal effort Cardio: S1-S2 is normal regular.  No S3-S4.  No rubs murmurs or bruit GI: Abdomen is soft.  Mild tenderness noted diffusely without any rebound rigidity or guarding.  Bowel sounds present.  G-tube is noted. Physical deconditioning is noted. No obvious focal neurological deficits.  Data Reviewed:   CBC: Recent Labs  Lab 07/20/23 0503 07/21/23 0409 07/22/23 0351 07/23/23 0101 07/24/23 0445  WBC 9.8 10.7* 12.1* 11.3* 8.6  HGB 11.1* 11.4* 10.4* 10.3* 10.2*  HCT 34.1* 36.0 32.0* 32.7* 31.9*  MCV 99.4 98.6 100.9* 101.2* 100.6*  PLT 469* 445* 369 373 308   Basic Metabolic Panel: Recent Labs  Lab 07/19/23 0450 07/22/23 0351 07/23/23 0101  NA 138 139 134*  K 3.1* 3.1* 3.7  CL 96* 99 95*  CO2 35* 30 28  GLUCOSE 133* 152* 140*  BUN 27* 25* 27*  CREATININE 0.70 0.90 0.75  CALCIUM 9.1 9.3 8.8*  MG  --  1.9 2.0   GFR: Estimated Creatinine Clearance: 67.4 mL/min (  by C-G formula based on SCr of 0.75 mg/dL).  CBG: Recent Labs  Lab 07/23/23 1700 07/23/23 2004 07/23/23 2349 07/24/23 0409 07/24/23 0821  GLUCAP 128* 136* 119* 130* 170*      Radiology Studies: No results found.  Scheduled Meds:  acetaminophen  (TYLENOL ) oral liquid 160 mg/5 mL  650 mg Per Tube Q6H   amiodarone   200 mg Per Tube Daily   busPIRone   5 mg Per Tube TID   Chlorhexidine  Gluconate Cloth  6 each Topical Q0600   docusate  100 mg Per Tube BID   escitalopram   20 mg Per Tube Daily   feeding supplement (PROSource TF20)  60 mL Per Tube Daily   free water   100 mL Per Tube  Q4H   furosemide   20 mg Intravenous Daily   insulin  aspart  0-20 Units Subcutaneous Q4H   lidocaine   1 patch Transdermal Q24H   metoCLOPramide  (REGLAN ) injection  10 mg Intravenous Q6H   metoprolol  tartrate  12.5 mg Oral BID   midodrine   10 mg Per Tube Q8H   mouth rinse  15 mL Mouth Rinse 4 times per day   pantoprazole  (PROTONIX ) IV  40 mg Intravenous Q12H   polyethylene glycol  17 g Per Tube Daily   sodium chloride  flush  10-40 mL Intracatheter Q12H   traZODone  50 mg Oral QHS   Continuous Infusions:  feeding supplement (VITAL 1.5 CAL) 1,000 mL (07/24/23 0321)   heparin  1,750 Units/hr (07/23/23 2024)     LOS: 24 days     Maylene Spear,  [facility]  If 7PM-7AM, please contact night-coverage [email or web address]  07/24/2023, 9:24 AM

## 2023-07-25 DIAGNOSIS — K449 Diaphragmatic hernia without obstruction or gangrene: Secondary | ICD-10-CM | POA: Diagnosis not present

## 2023-07-25 DIAGNOSIS — I4891 Unspecified atrial fibrillation: Secondary | ICD-10-CM | POA: Diagnosis not present

## 2023-07-25 LAB — BASIC METABOLIC PANEL WITH GFR
Anion gap: 8 (ref 5–15)
BUN: 17 mg/dL (ref 8–23)
CO2: 31 mmol/L (ref 22–32)
Calcium: 9.2 mg/dL (ref 8.9–10.3)
Chloride: 99 mmol/L (ref 98–111)
Creatinine, Ser: 0.57 mg/dL (ref 0.44–1.00)
GFR, Estimated: >60 mL/min (ref >60)
Glucose, Bld: 130 mg/dL — ABNORMAL HIGH (ref 70–99)
Potassium: 3.9 mmol/L (ref 3.5–5.1)
Sodium: 138 mmol/L (ref 135–145)

## 2023-07-25 LAB — CBC
HCT: 33.8 % — ABNORMAL LOW (ref 36.0–46.0)
Hemoglobin: 10.7 g/dL — ABNORMAL LOW (ref 12.0–15.0)
MCH: 32.1 pg (ref 26.0–34.0)
MCHC: 31.7 g/dL (ref 30.0–36.0)
MCV: 101.5 fL — ABNORMAL HIGH (ref 80.0–100.0)
Platelets: 304 10*3/uL (ref 150–400)
RBC: 3.33 MIL/uL — ABNORMAL LOW (ref 3.87–5.11)
RDW: 14.2 % (ref 11.5–15.5)
WBC: 7.5 10*3/uL (ref 4.0–10.5)
nRBC: 0 % (ref 0.0–0.2)

## 2023-07-25 LAB — HEPARIN LEVEL (UNFRACTIONATED): Heparin Unfractionated: 0.72 [IU]/mL — ABNORMAL HIGH (ref 0.30–0.70)

## 2023-07-25 LAB — GLUCOSE, CAPILLARY
Glucose-Capillary: 102 mg/dL — ABNORMAL HIGH (ref 70–99)
Glucose-Capillary: 109 mg/dL — ABNORMAL HIGH (ref 70–99)
Glucose-Capillary: 111 mg/dL — ABNORMAL HIGH (ref 70–99)
Glucose-Capillary: 124 mg/dL — ABNORMAL HIGH (ref 70–99)
Glucose-Capillary: 126 mg/dL — ABNORMAL HIGH (ref 70–99)
Glucose-Capillary: 139 mg/dL — ABNORMAL HIGH (ref 70–99)
Glucose-Capillary: 144 mg/dL — ABNORMAL HIGH (ref 70–99)

## 2023-07-25 LAB — MAGNESIUM: Magnesium: 1.9 mg/dL (ref 1.7–2.4)

## 2023-07-25 MED ORDER — MAGNESIUM SULFATE 2 GM/50ML IV SOLN
2.0000 g | Freq: Once | INTRAVENOUS | Status: AC
  Administered 2023-07-25: 2 g via INTRAVENOUS
  Filled 2023-07-25: qty 50

## 2023-07-25 MED ORDER — APIXABAN 5 MG PO TABS
5.0000 mg | ORAL_TABLET | Freq: Two times a day (BID) | ORAL | Status: DC
  Administered 2023-07-25 – 2023-07-29 (×9): 5 mg via ORAL
  Filled 2023-07-25 (×9): qty 1

## 2023-07-25 MED ORDER — POTASSIUM CHLORIDE 20 MEQ PO PACK
40.0000 meq | PACK | Freq: Once | ORAL | Status: AC
  Administered 2023-07-25: 40 meq
  Filled 2023-07-25: qty 2

## 2023-07-25 NOTE — Plan of Care (Signed)
  Problem: Education: Goal: Knowledge of General Education information will improve Description: Including pain rating scale, medication(s)/side effects and non-pharmacologic comfort measures Outcome: Progressing   Problem: Health Behavior/Discharge Planning: Goal: Ability to manage health-related needs will improve Outcome: Progressing   Problem: Clinical Measurements: Goal: Ability to maintain clinical measurements within normal limits will improve Outcome: Progressing Goal: Will remain free from infection Outcome: Progressing Goal: Diagnostic test results will improve Outcome: Progressing Goal: Respiratory complications will improve Outcome: Progressing Goal: Cardiovascular complication will be avoided Outcome: Progressing   Problem: Activity: Goal: Risk for activity intolerance will decrease Outcome: Progressing   Problem: Elimination: Goal: Will not experience complications related to bowel motility Outcome: Progressing Goal: Will not experience complications related to urinary retention Outcome: Progressing   Problem: Pain Managment: Goal: General experience of comfort will improve and/or be controlled Outcome: Progressing

## 2023-07-25 NOTE — TOC Progression Note (Signed)
 Transition of Care [facility]) - Progression Note    Patient Details  Name: Darlene [name] MRN: [id number] Date of Birth: [birth year]  Transition of Care [facility]) CM/SW Contact  Adline Hook, [place] Phone Number: 07/25/2023, 3:26 PM  Clinical Narrative:     CSW spoke with patient at bedside and daughter by phone to give bed offers. Patient and daughter picked [facility]. CSW alerted admissions counselor.   Expected Discharge Plan: Skilled Nursing Facility Barriers to Discharge: English as a second language teacher, Continued Medical Work up  Expected Discharge Plan and Services In-house Referral: Clinical Social Work   Post Acute Care Choice:  (TBD) Living arrangements for the past 2 months: Single Family Home                                       Social Determinants of Health (SDOH) Interventions SDOH Screenings   Food Insecurity: No Food Insecurity (06/29/2023)  Housing: Low Risk  (06/29/2023)  Transportation Needs: No Transportation Needs (06/29/2023)  Utilities: Not At Risk (06/29/2023)  Alcohol Screen: Low Risk  (04/28/2019)  Depression (PHQ2-9): Medium Risk (06/15/2023)  Financial Resource Strain: Low Risk  (02/03/2021)  Physical Activity: Inactive (04/28/2019)  Social Connections: Moderately Isolated (06/29/2023)  Stress: No Stress Concern Present (04/28/2019)  Tobacco Use: Low Risk  (07/21/2023)    Readmission Risk Interventions     No data to display

## 2023-07-25 NOTE — Progress Notes (Signed)
 PROGRESS NOTE    Darlene [name]  DVV:[id number] DOB: [birth year] DOA: 06/29/2023 PCP: Darlene Mis, FNP   Brief Narrative:  Darlene [name] is a 83 y.o. F with PMH significant for atrial fibrillation on eliquis , OSA on CPAP, HTN, HLD, GERD and esophageal strictures.  She presents to our facility with ongoing coffee-ground emesis on 06/29/2023.  CT abdomen pelvis with contrast not revealing of any acute source of bleeding although noted high-grade stenosis of the celiac axis and collateral flow from the superior mesenteric artery-patient previously scheduled for hiatal hernia and spigelian hernia repair with Dr. Jamse Mcgee later this month.  Initially admitted for EGD planned for 06/30/2023, overnight noted to transition to A-fib with RVR with profound hypoxia during the event with noted pulmonary edema and large volume aspiration -initially placed on BiPAP with worsening respiratory status ultimately intubated for airway protection.  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  Ultimately extubated 07/04/23.  ICU delirium improving over the past week, respiratory status improving, echo showing moderate to severe right ventricular dysfunction.  Patient required transient pressors in the interim likely in setting of sepsis due to large volume aspiration pneumonia.  Patient weaned to nasal cannula, off pressors now transition to p.o. midodrine  and otherwise stable for transfer to hospital floor and out of the ICU.  Hospital course 4/15 admit with coffee ground emesis for inpatient EGD 4/16 afib with RVR, concern for pulm edema, PCCM consult and ICU txfr 4/17: ETT air leak, fixed with more balloon inflation. On Precedex  and Fentanyl  drips for sedation. Upper endoscopy-->Cameron ulcers on hiatal hernia.  A-fib rate controlled on amiodarone . 4/21 Complaints of epigastric pain vs delirium overnight, still on amio gtt/heparin . Tube feeds paused this early this morning.    4/22 Delirium improving, patient had BM, Pulmonary Edema vs BL infiltrates on CXR-, CVP 18 -diuresed with lasix  and metolazone  4/24 Diuresed 3.5 L with metolazone /Lasix  4/26 Placed on BiPAP for increased WOB 4/27 Point-of-care echo showed moderate to severe RV dysfunction.  LV function appears normal.  There is consolidation at both bases with a trivial effusion unsuitable for thoracentesis.  Started on norepinephrine  to support blood pressure as patient required Precedex  for delirium, which exacerbated hypotension 4/28 thoracentesis. Right 100 ml. Exudate by lights. Likely parapneumonic  4/29 off norepi PCXR improved. WBC nml pct neg stopped abx. Feeling better  4/30 weaned down to 4L [place] - transition to [facility]   Assessment & Plan:  Acute hypoxic and hypercapnic respiratory failure Aspiration pneumonia in setting of large hiatal hernia Complicated by volume overload/parapneumonic pleural effusion Completed antibiotic course for aspiration pneumonia coverage Seems to have been weaned off of oxygen.  Saturations are in the mid 90s on room air. Echocardiogram from April showed LVEF of 50 to 55% with LVH.  Diastolic function could not be assessed. Patient remains on IV diuretics, furosemide  40 mg twice a day.  Has been in negative fluid balance.  Monitor electrolytes periodically. Respiratory status is stable.    Acute versus subacute GI bleed Hiatal hernia with oozing Darlene [name] erosions Seen by gastroenterology.  Underwent EGD 4/17 confirms Cameron ulcers on hiatal hernia General Surgery was subsequently consulted. Patient underwent repair of the incarcerated paraesophageal hernia.  Also underwent gastrostomy tube placement and reduction of bowel from the incarcerated right lower quadrant hernia. Started on tube feedings by general surgery.  Had an episode of vomiting yesterday morning but none since then.  Abdominal pain is about the same with no worsening.  Had a bowel movement this  morning. Voiding trial today.  Hypotension, improving Questionably iatrogenic versus septic shock given above Orthostatic hypotension ongoing Required vasopressors in the ICU. Currently on midodrine .  Continue to monitor.  Blood pressure is stable.   A-fib with RVR On Eliquis  at baseline.  Currently on IV heparin . Patient noted to be on amiodarone .  Required Cardizem  infusion in the OR on 5/7.  Telemetry shows atrial fibrillation with heart rate between 90-120.  Improvement in heart rate control noted. Continue current medications.  Noted to be on amiodarone  and metoprolol . Transition to Eliquis  today.  Hypokalemia Continue to supplement potassium and magnesium  as indicated.     Severe deconditioning Ambulatory dysfunction PT and OT following.  SNF is recommended for short-term rehab.   Hyperglycemia without history of diabetes Continue sliding scale insulin , hypoglycemic protocol A1c previously within normal limits 5.6  Macrocytic anemia Hemoglobin has been stable. Will check anemia panel.  TSH was normal in April.  DVT prophylaxis: Transition to Eliquis  today Code Status:   Code Status: Full Code Family Communication: No family at bedside. Disposition: SNF for short-term rehab when medically stable   Consultants:  PCCM, GI, general surgery  Antimicrobials:  Previously completed  Subjective: Feels well.  Denies any abdominal pain more than usual.  No further episodes of nausea and vomiting.  No shortness of breath.  Objective: Vitals:   07/24/23 1452 07/24/23 2100 07/25/23 0535 07/25/23 0946  BP: 113/65 (!) 104/49 (!) 109/59 116/83  Pulse: 87 (!) 104 92 83  Resp: 16   16  Temp: 98 F (36.7 C) 98 F (36.7 C) 98.1 F (36.7 C) 97.9 F (36.6 C)  TempSrc: Oral Oral Oral   SpO2: 96% 97% 95% 94%  Weight:      Height:        Intake/Output Summary (Last 24 hours) at 07/25/2023 1034 Last data filed at 07/25/2023 1000 Gross per 24 hour  Intake 480 ml  Output  2050 ml  Net -1570 ml   Filed Weights   07/05/23 0522 07/11/23 0500 07/19/23 1547  Weight: 98 kg 96.5 kg 97.5 kg    Examination:  Awake alert.  In no distress Lungs or clear to auscultation bilaterally with normal effort. S1-S2 is irregularly irregular. Abdomen is soft.  G-tube is noted.  Mildly tender without any rebound rigidity or guarding.  Data Reviewed:   CBC: Recent Labs  Lab 07/21/23 0409 07/22/23 0351 07/23/23 0101 07/24/23 0445 07/25/23 0400  WBC 10.7* 12.1* 11.3* 8.6 7.5  HGB 11.4* 10.4* 10.3* 10.2* 10.7*  HCT 36.0 32.0* 32.7* 31.9* 33.8*  MCV 98.6 100.9* 101.2* 100.6* 101.5*  PLT 445* 369 373 308 304   Basic Metabolic Panel: Recent Labs  Lab 07/19/23 0450 07/22/23 0351 07/23/23 0101 07/25/23 0400  NA 138 139 134* 138  K 3.1* 3.1* 3.7 3.9  CL 96* 99 95* 99  CO2 35* 30 28 31   GLUCOSE 133* 152* 140* 130*  BUN 27* 25* 27* 17  CREATININE 0.70 0.90 0.75 0.57  CALCIUM 9.1 9.3 8.8* 9.2  MG  --  1.9 2.0 1.9   GFR: Estimated Creatinine Clearance: 67.4 mL/min (by C-G formula based on SCr of 0.57 mg/dL).  CBG: Recent Labs  Lab 07/24/23 1609 07/24/23 1942 07/24/23 2355 07/25/23 0359 07/25/23 0750  GLUCAP 115* 93 114* 109* 126*      Radiology Studies: No results found.  Scheduled Meds:  acetaminophen  (TYLENOL ) oral liquid 160 mg/5 mL  650 mg Per Tube Q6H  amiodarone   200 mg Per Tube Daily   apixaban   5 mg Oral BID   busPIRone   5 mg Per Tube TID   Chlorhexidine  Gluconate Cloth  6 each Topical Q0600   docusate  100 mg Per Tube BID   escitalopram   20 mg Per Tube Daily   feeding supplement (PROSource TF20)  60 mL Per Tube Daily   free water   100 mL Per Tube Q4H   furosemide   20 mg Intravenous Daily   insulin  aspart  0-20 Units Subcutaneous Q4H   lidocaine   1 patch Transdermal Q24H   metoCLOPramide  (REGLAN ) injection  10 mg Intravenous Q6H   metoprolol  tartrate  12.5 mg Oral BID   midodrine   10 mg Per Tube Q8H   mouth rinse  15 mL Mouth  Rinse 4 times per day   pantoprazole  (PROTONIX ) IV  40 mg Intravenous Q12H   polyethylene glycol  17 g Per Tube Daily   sodium chloride  flush  10-40 mL Intracatheter Q12H   traZODone  50 mg Oral QHS   Continuous Infusions:  feeding supplement (VITAL 1.5 CAL) 1,000 mL (07/25/23 0957)     LOS: 25 days     Maylene Spear,  [facility]  If 7PM-7AM, please contact night-coverage [email or web address]  07/25/2023, 10:34 AM

## 2023-07-25 NOTE — Progress Notes (Addendum)
 PHARMACY - ANTICOAGULATION Pharmacy Consult for heparin  >> apixaban  Indication: atrial fibrillation  Allergies  Allergen Reactions   Cephalosporins Rash    Tolerated ceftriaxone  06/2023   Keflex [Cephalexin] Rash    Patient Measurements: Height: 5\' 10"  (177.8 cm) Weight: 97.5 kg (214 lb 15.2 oz) IBW/kg (Calculated) : 68.5 HEPARIN  DW (KG): 91  Vital Signs: Temp: 98.1 F (36.7 C) (05/11 0535) Temp Source: Oral (05/11 0535) BP: 109/59 (05/11 0535) Pulse Rate: 92 (05/11 0535)  Labs: Recent Labs    07/23/23 0101 07/23/23 1032 07/24/23 0445 07/25/23 0400  HGB 10.3*  --  10.2* 10.7*  HCT 32.7*  --  31.9* 33.8*  PLT 373  --  308 304  HEPARINUNFRC  --  0.54 0.55 0.72*  CREATININE 0.75  --   --  0.57    Estimated Creatinine Clearance: 67.4 mL/min (by C-G formula based on SCr of 0.57 mg/dL).   Assessment: 83 YO female with PMH significant for atrial fibrillation on Eliquis  admitted for hiatal hernia with oozing erosions leaking to UGIB. Plan operative repair currently scheduled for 5/07. Apixaban  now held, last dose 5/01 @ 2200. Pharmacy consulted to initiate IV heparin  in interim.   Heparin  level therapeutic at 0.54 on 1750 units/hr. Hgb stable 10s, platelets are normal. No bleeding noted. Confirmed with RN heparin  infusing through PIV.  5/11 AM update: HL 0.72 PLT wnl Hgb 10.7- stable No signs of bleeding or issues with heparin  gtt per nursing  Goal of Therapy:  Monitor platelets by anticoagulation protocol: Yes   Plan:  Discontinue heparin  gtt Start apixaban  5 mg po bid (PTA dose / med)  Monitor for s/sx of bleeding  Thank you for involving pharmacy in this patient's care.  Marleta Simmer BS, PharmD, BCPS Clinical Pharmacist 07/25/2023 8:43 AM  Contact: [phone] after 3 PM  "Be curious, not judgmental..." -Rumalda Counter

## 2023-07-26 DIAGNOSIS — I4891 Unspecified atrial fibrillation: Secondary | ICD-10-CM | POA: Diagnosis not present

## 2023-07-26 DIAGNOSIS — K449 Diaphragmatic hernia without obstruction or gangrene: Secondary | ICD-10-CM | POA: Diagnosis not present

## 2023-07-26 LAB — GLUCOSE, CAPILLARY
Glucose-Capillary: 134 mg/dL — ABNORMAL HIGH (ref 70–99)
Glucose-Capillary: 121 mg/dL — ABNORMAL HIGH (ref 70–99)
Glucose-Capillary: 104 mg/dL — ABNORMAL HIGH (ref 70–99)
Glucose-Capillary: 166 mg/dL — ABNORMAL HIGH (ref 70–99)
Glucose-Capillary: 113 mg/dL — ABNORMAL HIGH (ref 70–99)

## 2023-07-26 LAB — CBC
HCT: 34.2 % — ABNORMAL LOW (ref 36.0–46.0)
Hemoglobin: 10.8 g/dL — ABNORMAL LOW (ref 12.0–15.0)
MCH: 32.1 pg (ref 26.0–34.0)
MCHC: 31.6 g/dL (ref 30.0–36.0)
MCV: 101.8 fL — ABNORMAL HIGH (ref 80.0–100.0)
Platelets: 283 10*3/uL (ref 150–400)
RBC: 3.36 MIL/uL — ABNORMAL LOW (ref 3.87–5.11)
RDW: 14.5 % (ref 11.5–15.5)
WBC: 10.3 10*3/uL (ref 4.0–10.5)
nRBC: 0 % (ref 0.0–0.2)

## 2023-07-26 MED ORDER — BUSPIRONE HCL 5 MG PO TABS
5.0000 mg | ORAL_TABLET | Freq: Three times a day (TID) | ORAL | Status: DC
  Administered 2023-07-26 – 2023-07-29 (×10): 5 mg via ORAL
  Filled 2023-07-26 (×10): qty 1

## 2023-07-26 MED ORDER — METOCLOPRAMIDE HCL 5 MG PO TABS: 10.0000 mg | ORAL_TABLET | Freq: Three times a day (TID) | ORAL | Status: DC

## 2023-07-26 MED ORDER — MIDODRINE HCL 5 MG PO TABS
10.0000 mg | ORAL_TABLET | Freq: Three times a day (TID) | ORAL | Status: DC
  Administered 2023-07-26 – 2023-07-29 (×10): 10 mg via ORAL
  Filled 2023-07-26 (×9): qty 2

## 2023-07-26 MED ORDER — AMIODARONE HCL 200 MG PO TABS
200.0000 mg | ORAL_TABLET | Freq: Every day | ORAL | Status: DC
Start: 2023-07-27 — End: 2023-07-30
  Administered 2023-07-27 – 2023-07-29 (×3): 200 mg via ORAL
  Filled 2023-07-26 (×3): qty 1

## 2023-07-26 MED ORDER — POLYETHYLENE GLYCOL 3350 17 G PO PACK
17.0000 g | PACK | Freq: Every day | ORAL | Status: DC
Start: 2023-07-27 — End: 2023-07-30
  Filled 2023-07-26 (×3): qty 1

## 2023-07-26 MED ORDER — PANTOPRAZOLE SODIUM 40 MG PO TBEC
40.0000 mg | DELAYED_RELEASE_TABLET | Freq: Two times a day (BID) | ORAL | Status: DC
  Administered 2023-07-26 – 2023-07-29 (×6): 40 mg via ORAL
  Filled 2023-07-26 (×6): qty 1

## 2023-07-26 MED ORDER — ACETAMINOPHEN 160 MG/5ML PO SOLN
650.0000 mg | Freq: Four times a day (QID) | ORAL | Status: DC
  Administered 2023-07-26 – 2023-07-29 (×14): 650 mg via ORAL
  Filled 2023-07-26 (×17): qty 20.3

## 2023-07-26 MED ORDER — TRAMADOL HCL 50 MG PO TABS
50.0000 mg | ORAL_TABLET | Freq: Four times a day (QID) | ORAL | Status: DC | PRN
  Administered 2023-07-29: 50 mg via ORAL
  Filled 2023-07-26: qty 1

## 2023-07-26 MED ORDER — METOCLOPRAMIDE HCL 5 MG PO TABS
10.0000 mg | ORAL_TABLET | Freq: Three times a day (TID) | ORAL | Status: DC
  Administered 2023-07-26 – 2023-07-29 (×14): 10 mg via ORAL
  Filled 2023-07-26 (×14): qty 2

## 2023-07-26 MED ORDER — DOCUSATE SODIUM 50 MG/5ML PO LIQD
100.0000 mg | Freq: Two times a day (BID) | ORAL | Status: DC
  Filled 2023-07-26 (×5): qty 10

## 2023-07-26 NOTE — Progress Notes (Signed)
 PROGRESS NOTE    Darlene [name]  DDU:[id number] DOB: 1940-12-08 DOA: 06/29/2023 PCP: Jenelle Mis, FNP   Brief Narrative:  Darlene [name] is a 83 y.o. F with PMH significant for atrial fibrillation on eliquis , OSA on CPAP, HTN, HLD, GERD and esophageal strictures.  She presents to our facility with ongoing coffee-ground emesis on 06/29/2023.  CT abdomen pelvis with contrast not revealing of any acute source of bleeding although noted high-grade stenosis of the celiac axis and collateral flow from the superior mesenteric artery-patient previously scheduled for hiatal hernia and spigelian hernia repair with Dr. Jamse Mcgee later this month.  Initially admitted for EGD planned for 06/30/2023, overnight noted to transition to A-fib with RVR with profound hypoxia during the event with noted pulmonary edema and large volume aspiration -initially placed on BiPAP with worsening respiratory status ultimately intubated for airway protection.  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  Ultimately extubated 07/04/23.  ICU delirium improving over the past week, respiratory status improving, echo showing moderate to severe right ventricular dysfunction.  Patient required transient pressors in the interim likely in setting of sepsis due to large volume aspiration pneumonia.  Patient weaned to nasal cannula, off pressors now transition to p.o. midodrine  and otherwise stable for transfer to hospital floor and out of the ICU.  Hospital course 4/15 admit with coffee ground emesis for inpatient EGD 4/16 afib with RVR, concern for pulm edema, PCCM consult and ICU txfr 4/17: ETT air leak, fixed with more balloon inflation. On Precedex  and Fentanyl  drips for sedation. Upper endoscopy-->Cameron ulcers on hiatal hernia.  A-fib rate controlled on amiodarone . 4/21 Complaints of epigastric pain vs delirium overnight, still on amio gtt/heparin . Tube feeds paused this early this morning.    4/22 Delirium improving, patient had BM, Pulmonary Edema vs BL infiltrates on CXR-, CVP 18 -diuresed with lasix  and metolazone  4/24 Diuresed 3.5 L with metolazone /Lasix  4/26 Placed on BiPAP for increased WOB 4/27 Point-of-care echo showed moderate to severe RV dysfunction.  LV function appears normal.  There is consolidation at both bases with a trivial effusion unsuitable for thoracentesis.  Started on norepinephrine  to support blood pressure as patient required Precedex  for delirium, which exacerbated hypotension 4/28 thoracentesis. Right 100 ml. Exudate by lights. Likely parapneumonic  4/29 off norepi PCXR improved. WBC nml pct neg stopped abx. Feeling better  4/30 weaned down to 4L [place] - transition to [facility]   Assessment & Plan:  Acute hypoxic and hypercapnic respiratory failure Aspiration pneumonia in setting of large hiatal hernia Complicated by volume overload/parapneumonic pleural effusion Completed antibiotic course for aspiration pneumonia coverage Seems to have been weaned off of oxygen.  Saturations are in the mid 90s on room air. Echocardiogram from April showed LVEF of 50 to 55% with LVH.  Diastolic function could not be assessed. Patient remains on IV diuretics, furosemide  40 mg twice a day.  Has been in negative fluid balance.  Monitor electrolytes periodically. Respiratory status is stable.    Acute versus subacute GI bleed Hiatal hernia with oozing Darlene [name] erosions Seen by gastroenterology.  Underwent EGD 4/17 confirms Cameron ulcers on hiatal hernia General Surgery was subsequently consulted. Patient underwent repair of the incarcerated paraesophageal hernia.  Also underwent gastrostomy tube placement and reduction of bowel from the incarcerated right lower quadrant hernia. Started on tube feedings by general surgery.  Had an episode of vomiting yesterday morning but none since then.  Abdominal pain is about the same with no worsening.  Had a bowel movement this  morning. Foley catheter was removed on 5/11.  Has been able to void urine. Surgery continues to follow.  Remains on a dysphagia 1 diet along with tube feedings.  Hypotension, improving Questionably iatrogenic versus septic shock given above Orthostatic hypotension ongoing Required vasopressors in the ICU. Currently on midodrine .  Continue to monitor.  Blood pressure is stable.   A-fib with RVR On Eliquis  at baseline.  Currently on IV heparin . Patient noted to be on amiodarone .  Required Cardizem  infusion in the OR on 5/7.  Patient remains on amiodarone  and metoprolol .  Improvement in heart rate noted. Transitioned back to Eliquis .  Hemoglobin has been stable.  Check labs periodically.  Hypokalemia Continue to supplement potassium and magnesium  as indicated.  Check labs periodically.   Severe deconditioning Ambulatory dysfunction PT and OT following.  SNF is recommended for short-term rehab.   Hyperglycemia without history of diabetes Continue sliding scale insulin , hypoglycemic protocol A1c previously within normal limits 5.6  Macrocytic anemia Hemoglobin has been stable. Will check anemia panel.  TSH was normal in April.  DVT prophylaxis: Back on Eliquis  Code Status:   Code Status: Full Code Family Communication: No family at bedside. Disposition: SNF for short-term rehab when medically stable   Consultants:  PCCM, GI, general surgery  Antimicrobials:  Previously completed  Subjective: Patient feels well.  No complaints offered.  Urinating on her own.  Having bowel movements.  Abdominal pain is well-controlled.  Denies any shortness of breath.  No further nausea and vomiting  Objective: Vitals:   07/25/23 2154 07/26/23 0200 07/26/23 0505 07/26/23 0844  BP: 112/65  (!) 106/56 (!) 111/53  Pulse: (!) 110  94   Resp: (!) 28 19  16   Temp: 97.9 F (36.6 C)  97.7 F (36.5 C) 97.6 F (36.4 C)  TempSrc: Oral  Oral Oral  SpO2: 100%  94% 95%  Weight:      Height:         Intake/Output Summary (Last 24 hours) at 07/26/2023 1109 Last data filed at 07/26/2023 0900 Gross per 24 hour  Intake 120 ml  Output --  Net 120 ml   Filed Weights   07/05/23 0522 07/11/23 0500 07/19/23 1547  Weight: 98 kg 96.5 kg 97.5 kg    Examination:  General appearance: Awake alert.  In no distress Resp: Clear to auscultation bilaterally.  Normal effort Cardio: S1-S2 is normal regular.  No S3-S4.  No rubs murmurs or bruit Abdomen is soft.  G-tube is noted.  Data Reviewed:   CBC: Recent Labs  Lab 07/22/23 0351 07/23/23 0101 07/24/23 0445 07/25/23 0400 07/26/23 0255  WBC 12.1* 11.3* 8.6 7.5 10.3  HGB 10.4* 10.3* 10.2* 10.7* 10.8*  HCT 32.0* 32.7* 31.9* 33.8* 34.2*  MCV 100.9* 101.2* 100.6* 101.5* 101.8*  PLT 369 373 308 304 283   Basic Metabolic Panel: Recent Labs  Lab 07/22/23 0351 07/23/23 0101 07/25/23 0400  NA 139 134* 138  K 3.1* 3.7 3.9  CL 99 95* 99  CO2 30 28 31   GLUCOSE 152* 140* 130*  BUN 25* 27* 17  CREATININE 0.90 0.75 0.57  CALCIUM 9.3 8.8* 9.2  MG 1.9 2.0 1.9   GFR: Estimated Creatinine Clearance: 67.4 mL/min (by C-G formula based on SCr of 0.57 mg/dL).  CBG: Recent Labs  Lab 07/25/23 2018 07/25/23 2059 07/25/23 2349 07/26/23 0401 07/26/23 0845  GLUCAP 124* 111* 144* 134* 121*      Radiology Studies: No results found.  Scheduled Meds:  acetaminophen  (TYLENOL ) oral liquid 160 mg/5 mL  650 mg Oral Q6H   amiodarone   200 mg Per Tube Daily   apixaban   5 mg Oral BID   busPIRone   5 mg Per Tube TID   Chlorhexidine  Gluconate Cloth  6 each Topical Q0600   docusate  100 mg Per Tube BID   escitalopram   20 mg Per Tube Daily   feeding supplement (PROSource TF20)  60 mL Per Tube Daily   free water   100 mL Per Tube Q4H   furosemide   20 mg Intravenous Daily   insulin  aspart  0-20 Units Subcutaneous Q4H   lidocaine   1 patch Transdermal Q24H   metoCLOPramide  (REGLAN ) injection  10 mg Intravenous Q6H   metoprolol  tartrate  12.5 mg  Oral BID   midodrine   10 mg Per Tube Q8H   mouth rinse  15 mL Mouth Rinse 4 times per day   pantoprazole  (PROTONIX ) IV  40 mg Intravenous Q12H   polyethylene glycol  17 g Per Tube Daily   sodium chloride  flush  10-40 mL Intracatheter Q12H   traZODone  50 mg Oral QHS   Continuous Infusions:  feeding supplement (VITAL 1.5 CAL) 1,000 mL (07/26/23 1103)     LOS: 26 days     Darlene [name],  [facility]  If 7PM-7AM, please contact night-coverage [email or web address]  07/26/2023, 11:09 AM

## 2023-07-26 NOTE — Plan of Care (Signed)
   Problem: Education: Goal: Knowledge of General Education information will improve Description: Including pain rating scale, medication(s)/side effects and non-pharmacologic comfort measures Outcome: Progressing   Problem: Activity: Goal: Risk for activity intolerance will decrease Outcome: Progressing

## 2023-07-26 NOTE — Progress Notes (Signed)
 Physical Therapy Treatment Patient Details Name: Darlene [name] MRN: [id number] DOB: 12/28/1940 Today's Date: 07/26/2023   History of Present Illness 83 yo female admitted 06/29/23 with acute hypoxic hypercapnic respiratory failure in the setting of aspiration complicated by GI bleed. Intubated 4/16-4/20. 4/17 EGD. Now s/p repair of Type III incarcerated paraesophageal hernia, gastropexy, gastrostomy tube placement, reduction of bowel from incarcerated RLQ hernia and LOA. PMhx: depression, DJD, OSA, pulmonary HTN, obesity, HLD, Afib.    PT Comments  Pt eager to get OOB, states she wants to end up in the chair at the end of session. Abdominal binder and bilat LE wraps donned for BP management prior to mobility. Pt overall requiring min-mod physical assist for bed mobility and transfer into standing, pt tolerating standing x3 minutes total before becoming diaphoretic and dizzy. PT unable to obtain a BP at this time as pt also incontinent of stool, so pt safely returned to supine with Les elevated and symptoms of orthostatic hypotension subsided.  PT plan remains appropriate.   BP, HR: - Supine, HOB elevation 45 deg: 113/56, 75 - sitting EOB: 110/68, 72     If plan is discharge home, recommend the following: A lot of help with bathing/dressing/bathroom;Assistance with cooking/housework;Direct supervision/assist for financial management;Assist for transportation;Two people to help with walking and/or transfers;Direct supervision/assist for medications management   Can travel by private vehicle        Equipment Recommendations  Hospital bed;Wheelchair (measurements PT);Hoyer lift    Recommendations for Other Services       Precautions / Restrictions Precautions Precautions: Fall Recall of Precautions/Restrictions: Impaired Precaution/Restrictions Comments: watch hypotension, SPO2, Gtube (paused during session), incontinent Restrictions Weight Bearing Restrictions Per Provider Order: No      Mobility  Bed Mobility Overal bed mobility: Needs Assistance Bed Mobility: Rolling, Sidelying to Sit, Sit to Sidelying Rolling: Min assist Sidelying to sit: Mod assist     Sit to sidelying: Mod assist General bed mobility comments: assist for completion of roll towards R, trunk elevation and lowering, and LE lift back into bed    Transfers Overall transfer level: Needs assistance Equipment used: Rolling walker (2 wheels) Transfers: Sit to/from Stand Sit to Stand: Min assist, From elevated surface           General transfer comment: assist for initial power up and steadying once standing, standing tolerance x3 minutes before getting too diaphoretic and dizzy to continue standing    Ambulation/Gait                   Stairs             Wheelchair Mobility     Tilt Bed    Modified Rankin (Stroke Patients Only)       Balance Overall balance assessment: Needs assistance Sitting-balance support: Bilateral upper extremity supported, Feet supported Sitting balance-Leahy Scale: Fair Sitting balance - Comments: S initially at EOB though progressed to CGA for balance as symptomatic with low BP on EOB   Standing balance support: Bilateral upper extremity supported, Reliant on assistive device for balance, During functional activity Standing balance-Leahy Scale: Poor Standing balance comment: BUE support and minA                            Communication Communication Communication: No apparent difficulties  Cognition Arousal: Alert Behavior During Therapy: WFL for tasks assessed/performed   PT - Cognitive impairments: No apparent impairments  Following commands: Intact Following commands impaired: Follows multi-step commands inconsistently, Follows multi-step commands with increased time    Cueing Cueing Techniques: Verbal cues, Gestural cues, Tactile cues  Exercises      General Comments General  comments (skin integrity, edema, etc.): abd binder and BLEs ace wrapped to knees      Pertinent Vitals/Pain Pain Assessment Pain Assessment: Faces Faces Pain Scale: Hurts little more Pain Location: abdomen Pain Descriptors / Indicators: Discomfort, Grimacing Pain Intervention(s): Monitored during session, Limited activity within patient's tolerance, Repositioned    Home Living                          Prior Function            PT Goals (current goals can now be found in the care plan section) Acute Rehab PT Goals Patient Stated Goal: return home PT Goal Formulation: With patient/family Time For Goal Achievement: 07/20/23 Potential to Achieve Goals: Fair Progress towards PT goals: Progressing toward goals    Frequency    Min 2X/week      PT Plan      Co-evaluation              AM-PAC PT "6 Clicks" Mobility   Outcome Measure  Help needed turning from your back to your side while in a flat bed without using bedrails?: A Little Help needed moving from lying on your back to sitting on the side of a flat bed without using bedrails?: A Lot Help needed moving to and from a bed to a chair (including a wheelchair)?: A Lot Help needed standing up from a chair using your arms (e.g., wheelchair or bedside chair)?: A Little Help needed to walk in hospital room?: Total Help needed climbing 3-5 steps with a railing? : Total 6 Click Score: 12    End of Session   Activity Tolerance: Treatment limited secondary to medical complications (Comment);Other (comment) (orthostatic hypotension, bowel incontinence) Patient left: in bed;with call bell/phone within reach;with bed alarm set;with family/visitor present Nurse Communication: Mobility status;Other (comment) (pt is on bedpan and requesting time to sit, tube feeds paused x30 minutes) PT Visit Diagnosis: Unsteadiness on feet (R26.81);Other abnormalities of gait and mobility (R26.89);Muscle weakness (generalized)  (M62.81)     Time: 7829-5621 PT Time Calculation (min) (ACUTE ONLY): 25 min  Charges:    $Therapeutic Activity: 23-37 mins PT General Charges $$ ACUTE PT VISIT: 1 Visit                     Shirlene Doughty, PT DPT Acute Rehabilitation Services Secure Chat Preferred  Office [phone]    Teneisha Gignac Cydney Draft 07/26/2023, 2:53 PM

## 2023-07-26 NOTE — Plan of Care (Signed)
  Problem: Activity: Goal: Risk for activity intolerance will decrease Outcome: Progressing   Problem: Coping: Goal: Level of anxiety will decrease Outcome: Progressing   Problem: Elimination: Goal: Will not experience complications related to bowel motility Outcome: Progressing

## 2023-07-26 NOTE — TOC Progression Note (Signed)
 Transition of Care [facility]) - Progression Note    Patient Details  Name: Darlene [name] MRN: [id number] Date of Birth: 08/17/1940  Transition of Care [facility]) CM/SW Contact  Ernst Heap Phone Number: [phone] 07/26/2023, 12:08 PM  Clinical Narrative:  Patient and daughter picked [facility]. Patient is not medically ready for dc and EDD is unknown at this time per MD. Will continue to follow and start insurance auth once patient is closer to being medically ready.   TOC will continue following.      Expected Discharge Plan: Skilled Nursing Facility Barriers to Discharge: English as a second language teacher, Continued Medical Work up  Expected Discharge Plan and Services In-house Referral: Clinical Social Work   Post Acute Care Choice:  (TBD) Living arrangements for the past 2 months: Single Family Home                                       Social Determinants of Health (SDOH) Interventions SDOH Screenings   Food Insecurity: No Food Insecurity (06/29/2023)  Housing: Low Risk  (06/29/2023)  Transportation Needs: No Transportation Needs (06/29/2023)  Utilities: Not At Risk (06/29/2023)  Alcohol Screen: Low Risk  (04/28/2019)  Depression (PHQ2-9): Medium Risk (06/15/2023)  Financial Resource Strain: Low Risk  (02/03/2021)  Physical Activity: Inactive (04/28/2019)  Social Connections: Moderately Isolated (06/29/2023)  Stress: No Stress Concern Present (04/28/2019)  Tobacco Use: Low Risk  (07/21/2023)    Readmission Risk Interventions     No data to display

## 2023-07-26 NOTE — Progress Notes (Signed)
 5 Days Post-Op   Subjective/Chief Complaint: Eating about a third of meals, minimal nausea, no emesis   Objective: Vital signs in last 24 hours: Temp:  [97.6 F (36.4 C)-97.9 F (36.6 C)] 97.6 F (36.4 C) (05/12 0844) Pulse Rate:  [88-110] 94 (05/12 0505) Resp:  [16-28] 16 (05/12 0844) BP: (103-112)/(53-78) 111/53 (05/12 0844) SpO2:  [94 %-100 %] 95 % (05/12 0844) Last BM Date : 07/25/23  Intake/Output from previous day: 05/11 0701 - 05/12 0700 In: -  Out: 450 [Urine:450] Intake/Output this shift: No intake/output data recorded.  Ab soft nontender incisions clean g tube in place  Lab Results:  Recent Labs    07/25/23 0400 07/26/23 0255  WBC 7.5 10.3  HGB 10.7* 10.8*  HCT 33.8* 34.2*  PLT 304 283   BMET Recent Labs    07/25/23 0400  NA 138  K 3.9  CL 99  CO2 31  GLUCOSE 130*  BUN 17  CREATININE 0.57  CALCIUM 9.2   PT/INR No results for input(s): "LABPROT", "INR" in the last 72 hours. ABG No results for input(s): "PHART", "HCO3" in the last 72 hours.  Invalid input(s): "PCO2", "PO2"  Studies/Results: No results found.  Anti-infectives: Anti-infectives (From admission, onward)    Start     Dose/Rate Route Frequency Ordered Stop   07/20/23 1015  cefTRIAXone  (ROCEPHIN ) 2 g in sodium chloride  0.9 % 100 mL IVPB  Status:  Discontinued       Note to Pharmacy: Pharmacy may adjust dosing strength / duration / interval for maximal efficacy   2 g 200 mL/hr over 30 Minutes Intravenous On call to O.R. 07/20/23 0920 07/21/23 0559   07/09/23 0945  piperacillin -tazobactam (ZOSYN ) IVPB 3.375 g  Status:  Discontinued        3.375 g 12.5 mL/hr over 240 Minutes Intravenous Every 8 hours 07/09/23 0845 07/13/23 0842   07/05/23 0915  cefTRIAXone  (ROCEPHIN ) 2 g in sodium chloride  0.9 % 100 mL IVPB  Status:  Discontinued        2 g 200 mL/hr over 30 Minutes Intravenous Every 24 hours 07/05/23 0815 07/09/23 0845   06/30/23 2100  piperacillin -tazobactam (ZOSYN ) IVPB 3.375  g       Placed in "Followed by" Linked Group   3.375 g 12.5 mL/hr over 240 Minutes Intravenous Every 8 hours 06/30/23 1356 07/03/23 0016   06/30/23 1445  piperacillin -tazobactam (ZOSYN ) IVPB 3.375 g       Placed in "Followed by" Linked Group   3.375 g 100 mL/hr over 30 Minutes Intravenous  Once 06/30/23 1356 06/30/23 1626       Assessment/Plan: POD 5 s/p Robotic repair of Type III incarcerated paraesophageal hernia, gastropexy, gastrostomy tube placement, reduction of bowel from incarcerated right lower quadrant hernia, lysis of adhesions x50min Dr. Lanell Pinta 5/7 - having some reflux without n/v. Continue IV protonix . Mobilize as able - continue Tfs (at goal)-once tol enough can stop tube feeds, may need to do calorie count   FEN: dysphagia diet, Tfs per g tube ID:  none currently VTE: hep gtt  Enid Harry 07/26/2023

## 2023-07-27 DIAGNOSIS — K449 Diaphragmatic hernia without obstruction or gangrene: Secondary | ICD-10-CM | POA: Diagnosis not present

## 2023-07-27 DIAGNOSIS — I4891 Unspecified atrial fibrillation: Secondary | ICD-10-CM | POA: Diagnosis not present

## 2023-07-27 LAB — VITAMIN B12: Vitamin B-12: 673 pg/mL (ref 180–914)

## 2023-07-27 LAB — IRON AND TIBC
Iron: 53 ug/dL (ref 28–170)
Saturation Ratios: 16 % (ref 10.4–31.8)
TIBC: 328 ug/dL (ref 250–450)
UIBC: 275 ug/dL

## 2023-07-27 LAB — GLUCOSE, CAPILLARY
Glucose-Capillary: 122 mg/dL — ABNORMAL HIGH (ref 70–99)
Glucose-Capillary: 123 mg/dL — ABNORMAL HIGH (ref 70–99)
Glucose-Capillary: 128 mg/dL — ABNORMAL HIGH (ref 70–99)
Glucose-Capillary: 133 mg/dL — ABNORMAL HIGH (ref 70–99)
Glucose-Capillary: 146 mg/dL — ABNORMAL HIGH (ref 70–99)
Glucose-Capillary: 156 mg/dL — ABNORMAL HIGH (ref 70–99)

## 2023-07-27 LAB — RETICULOCYTES
Immature Retic Fract: 16.6 % — ABNORMAL HIGH (ref 2.3–15.9)
RBC.: 3.29 MIL/uL — ABNORMAL LOW (ref 3.87–5.11)
Retic Count, Absolute: 154.6 10*3/uL (ref 19.0–186.0)
Retic Ct Pct: 4.7 % — ABNORMAL HIGH (ref 0.4–3.1)

## 2023-07-27 LAB — FOLATE: Folate: 8.3 ng/mL (ref >5.9)

## 2023-07-27 LAB — FERRITIN: Ferritin: 240 ng/mL (ref 11–307)

## 2023-07-27 MED ORDER — ENSURE ENLIVE PO LIQD
237.0000 mL | Freq: Two times a day (BID) | ORAL | Status: DC
  Administered 2023-07-27 – 2023-07-29 (×4): 237 mL via ORAL

## 2023-07-27 MED ORDER — OSMOLITE 1.5 CAL PO LIQD
720.0000 mL | ORAL | Status: DC
  Administered 2023-07-27 – 2023-07-28 (×3): 720 mL
  Filled 2023-07-27: qty 1000
  Filled 2023-07-27: qty 948
  Filled 2023-07-27 (×2): qty 1000

## 2023-07-27 NOTE — Plan of Care (Signed)
   Problem: Education: Goal: Knowledge of General Education information will improve Description: Including pain rating scale, medication(s)/side effects and non-pharmacologic comfort measures Outcome: Progressing   Problem: Activity: Goal: Risk for activity intolerance will decrease Outcome: Progressing

## 2023-07-27 NOTE — Plan of Care (Signed)
  Problem: Education: Goal: Knowledge of General Education information will improve Description: Including pain rating scale, medication(s)/side effects and non-pharmacologic comfort measures Outcome: Progressing   Problem: Health Behavior/Discharge Planning: Goal: Ability to manage health-related needs will improve Outcome: Progressing   Problem: Clinical Measurements: Goal: Ability to maintain clinical measurements within normal limits will improve Outcome: Progressing Goal: Will remain free from infection Outcome: Progressing Goal: Diagnostic test results will improve Outcome: Progressing Goal: Respiratory complications will improve Outcome: Progressing Goal: Cardiovascular complication will be avoided Outcome: Progressing   Problem: Activity: Goal: Risk for activity intolerance will decrease Outcome: Progressing   Problem: Nutrition: Goal: Adequate nutrition will be maintained Outcome: Progressing   Problem: Coping: Goal: Level of anxiety will decrease Outcome: Progressing   Problem: Elimination: Goal: Will not experience complications related to bowel motility Outcome: Progressing Goal: Will not experience complications related to urinary retention Outcome: Progressing   Problem: Pain Managment: Goal: General experience of comfort will improve and/or be controlled Outcome: Progressing   Problem: Safety: Goal: Ability to remain free from injury will improve Outcome: Progressing   Problem: Skin Integrity: Goal: Risk for impaired skin integrity will decrease Outcome: Progressing   Problem: Education: Goal: Knowledge of disease or condition will improve Outcome: Progressing Goal: Understanding of medication regimen will improve Outcome: Progressing Goal: Individualized Educational Video(s) Outcome: Progressing   Problem: Activity: Goal: Ability to tolerate increased activity will improve Outcome: Progressing   Problem: Cardiac: Goal: Ability to achieve  and maintain adequate cardiopulmonary perfusion will improve Outcome: Progressing   Problem: Health Behavior/Discharge Planning: Goal: Ability to safely manage health-related needs after discharge will improve Outcome: Progressing   Problem: Activity: Goal: Ability to tolerate increased activity will improve Outcome: Progressing   Problem: Respiratory: Goal: Ability to maintain a clear airway and adequate ventilation will improve Outcome: Progressing   Problem: Role Relationship: Goal: Method of communication will improve Outcome: Progressing   Problem: Education: Goal: Ability to describe self-care measures that may prevent or decrease complications (Diabetes Survival Skills Education) will improve Outcome: Progressing Goal: Individualized Educational Video(s) Outcome: Progressing   Problem: Coping: Goal: Ability to adjust to condition or change in health will improve Outcome: Progressing   Problem: Fluid Volume: Goal: Ability to maintain a balanced intake and output will improve Outcome: Progressing   Problem: Health Behavior/Discharge Planning: Goal: Ability to identify and utilize available resources and services will improve Outcome: Progressing Goal: Ability to manage health-related needs will improve Outcome: Progressing   Problem: Metabolic: Goal: Ability to maintain appropriate glucose levels will improve Outcome: Progressing   Problem: Nutritional: Goal: Maintenance of adequate nutrition will improve Outcome: Progressing Goal: Progress toward achieving an optimal weight will improve Outcome: Progressing   Problem: Skin Integrity: Goal: Risk for impaired skin integrity will decrease Outcome: Progressing   Problem: Tissue Perfusion: Goal: Adequacy of tissue perfusion will improve Outcome: Progressing

## 2023-07-27 NOTE — TOC Progression Note (Signed)
 Transition of Care [facility]) - Progression Note    Patient Details  Name: Darlene [name] MRN: [id number] Date of Birth: 11-23-1940  Transition of [facility]) CM/SW Contact  Elspeth Hals, LCSW Phone Number: 07/27/2023, 4:22 PM  Clinical Narrative:   CSW confirmed with [facility] that bed is available tomorrow.  Auth request submitted in [place].    Expected Discharge Plan: Skilled Nursing Facility Barriers to Discharge: English as a second language teacher, Continued Medical Work up  Expected Discharge Plan and Services In-house Referral: Clinical Social Work   Post Acute Care Choice:  (TBD) Living arrangements for the past 2 months: Single Family Home                                       Social Determinants of Health (SDOH) Interventions SDOH Screenings   Food Insecurity: No Food Insecurity (06/29/2023)  Housing: Low Risk  (06/29/2023)  Transportation Needs: No Transportation Needs (06/29/2023)  Utilities: Not At Risk (06/29/2023)  Alcohol Screen: Low Risk  (04/28/2019)  Depression (PHQ2-9): Medium Risk (06/15/2023)  Financial Resource Strain: Low Risk  (02/03/2021)  Physical Activity: Inactive (04/28/2019)  Social Connections: Moderately Isolated (06/29/2023)  Stress: No Stress Concern Present (04/28/2019)  Tobacco Use: Low Risk  (07/21/2023)    Readmission Risk Interventions     No data to display

## 2023-07-27 NOTE — Progress Notes (Signed)
 6 Days Post-Op   Subjective/Chief Complaint:  Reports poor PO intake and poor appetite. Is having flatus and BMs. Also had one small volume episode of dark, coffee ground emesis yesterday   Objective: Vital signs in last 24 hours: Temp:  [97.6 F (36.4 C)-98.2 F (36.8 C)] 97.6 F (36.4 C) (05/13 0800) Pulse Rate:  [85-90] 90 (05/13 0800) Resp:  [16-20] 20 (05/13 0800) BP: (110-124)/(53-72) 110/53 (05/13 0800) SpO2:  [94 %-96 %] 94 % (05/13 0800) Last BM Date : 07/25/23  Intake/Output from previous day: 05/12 0701 - 05/13 0700 In: 360 [P.O.:360] Out: 1200 [Urine:1200] Intake/Output this shift: Total I/O In: -  Out: 600 [Urine:600]  Ab soft nontender incisions clean g tube in place with TF running at 45 mL/hr  Lab Results:  Recent Labs    07/25/23 0400 07/26/23 0255  WBC 7.5 10.3  HGB 10.7* 10.8*  HCT 33.8* 34.2*  PLT 304 283   BMET Recent Labs    07/25/23 0400  NA 138  K 3.9  CL 99  CO2 31  GLUCOSE 130*  BUN 17  CREATININE 0.57  CALCIUM 9.2    Studies/Results: No results found.  Anti-infectives: Anti-infectives (From admission, onward)    Start     Dose/Rate Route Frequency Ordered Stop   07/20/23 1015  cefTRIAXone  (ROCEPHIN ) 2 g in sodium chloride  0.9 % 100 mL IVPB  Status:  Discontinued       Note to Pharmacy: Pharmacy may adjust dosing strength / duration / interval for maximal efficacy   2 g 200 mL/hr over 30 Minutes Intravenous On call to O.R. 07/20/23 0920 07/21/23 0559   07/09/23 0945  piperacillin -tazobactam (ZOSYN ) IVPB 3.375 g  Status:  Discontinued        3.375 g 12.5 mL/hr over 240 Minutes Intravenous Every 8 hours 07/09/23 0845 07/13/23 0842   07/05/23 0915  cefTRIAXone  (ROCEPHIN ) 2 g in sodium chloride  0.9 % 100 mL IVPB  Status:  Discontinued        2 g 200 mL/hr over 30 Minutes Intravenous Every 24 hours 07/05/23 0815 07/09/23 0845   06/30/23 2100  piperacillin -tazobactam (ZOSYN ) IVPB 3.375 g       Placed in "Followed by" Linked  Group   3.375 g 12.5 mL/hr over 240 Minutes Intravenous Every 8 hours 06/30/23 1356 07/03/23 0016   06/30/23 1445  piperacillin -tazobactam (ZOSYN ) IVPB 3.375 g       Placed in "Followed by" Linked Group   3.375 g 100 mL/hr over 30 Minutes Intravenous  Once 06/30/23 1356 06/30/23 1626       Assessment/Plan: POD 6 s/p Robotic repair of Type III incarcerated paraesophageal hernia, gastropexy, gastrostomy tube placement, reduction of bowel from incarcerated right lower quadrant hernia, lysis of adhesions x74min Dr. Lanell Pinta 5/7 - having some reflux. Continue IV protonix . Mobilize as able - episode of dark emesis is likely post-operative blood, hgb pending for AM. Stools are non-bloody. Monitor. - consulted RD for transition to nocturnal TF, calorie count.   FEN: dysphagia diet, Tfs per g tube as above ID:  none currently VTE: Eliquis   Dispo: PT/OT recommending SNF,from a surgical standpoint she could go to SNF as early as tomorrow with TF that would be weaned as an outpatient     Charlott Converse 07/27/2023

## 2023-07-27 NOTE — Progress Notes (Signed)
 Nutrition Follow-up  DOCUMENTATION CODES:  Not applicable  INTERVENTION:  Transition to Nocturnal Tube Feeds via G-tube: Osmolite 1.5 at 60 mL/hr x 12 hours from 1800 to 0600 (720 mL/hr) Free water  flush: 100 mL q4h  Regimen provides 1080 kcal, 45 gm protein, 1149 mL total free water  daily. Calorie Count per MD Ensure Enlive po BID, each supplement provides 350 kcal and 20 grams of protein. Magic cup BID with meals, each supplement provides 290 kcal and 9 grams of protein Discontinue ProSource TF20  NUTRITION DIAGNOSIS:  Inadequate oral intake related to altered GI function (large hiatal hernia; cameron erosions) as evidenced by NPO status. - Progressing  GOAL:  Patient will meet greater than or equal to 90% of their needs - Being addressed via PO & TF  MONITOR:  PO intake, Supplement acceptance, I & O's, Labs  REASON FOR ASSESSMENT:  Ventilator, Consult Enteral/tube feeding initiation and management  ASSESSMENT:  Pt admitted with coffee ground emesis. PMH significant for afib on eliquis , OSA, HTN, HLD, GERD and esophageal stricture.  4/15: admit with coffee ground emesis 4/16: developed afib w/RVR, concern for pulmonary edema, transfer to ICU; intubated 4/17: EGD- large hiatal hernia with oozing cameron erosions; large residue and coffee ground material in hiatal hernia 4/18: cortrak placed; tip in distal stomach past hiatal hernia 4/20: extubated  4/28: thoracentesis-  yield fluid 4/30: Transferred to floor 5/07: Op, s/p  repair of incarcerated paraesophageal hernia, gastropexy, lysis of adhesions, and 20 Fr G-tube placement 5/08: diet advanced to clear liquids 5/09: diet advanced to Dysphagia 1 5/12: c/s for calorie count and adjust TF  Met with pt and daughter in room. Pt reports that she was eating well, but is now sick of the pureed foods and "hit a wall." Had one episode of vomiting yesterday, still having occasional nausea. Pt willing to try to drink Ensure  and Magic Cup to help with PO intake. Discussed transitioning to nocturnal feeds to allow appetite to improve during the day; pt agreeable. Also reviewed conducting a calorie count to assess pt PO intake.   Meal Intake 5/08: 100% dinner 5/09: 100% lunch, 100% dinner 5/10: 100% breakfast, 100% lunch, 100% dinner  Admit weight: 103.7 kg Current weight: 97.5 kg (5/5)  Nutrition Related Medications: Colace, Lasix , NovoLog  0-10 units q4h, Reglan  QID, Protonix , Miralax  Labs reviewed.   CBG: 104-166 mg/dL x 24 hrs   UOP: 9147 mL x 24 hr   Diet Order:   Diet Order             DIET - DYS 1 Room service appropriate? Yes; Fluid consistency: Thin  Diet effective now                   EDUCATION NEEDS:  Education needs have been addressed  Skin:  Skin Assessment: Reviewed RN Assessment  Last BM:  5/11 - Type 6  Height:  Ht Readings from Last 1 Encounters:  07/02/23 5\' 10"  (1.778 m)   Weight:  Wt Readings from Last 1 Encounters:  07/19/23 97.5 kg   Ideal Body Weight:  68.2 kg  BMI:  Body mass index is 30.84 kg/m.  Estimated Nutritional Needs:  Kcal:  1700-1900 Protein:  85-100g Fluid:  >/=1.7L   Doneta Furbish RD, LDN Clinical Dietitian

## 2023-07-27 NOTE — Progress Notes (Addendum)
 PROGRESS NOTE    Darlene [name]  ZOX:[id number] DOB: [birth year] DOA: 06/29/2023 PCP: Jenelle Mis, FNP   Brief Narrative:  Darlene [name] is a 83 y.o. F with PMH significant for atrial fibrillation on eliquis , OSA on CPAP, HTN, HLD, GERD and esophageal strictures.  She presents to our facility with ongoing coffee-ground emesis on 06/29/2023.  CT abdomen pelvis with contrast not revealing of any acute source of bleeding although noted high-grade stenosis of the celiac axis and collateral flow from the superior mesenteric artery-patient previously scheduled for hiatal hernia and spigelian hernia repair with Dr. Jamse Mcgee later this month.  Initially admitted for EGD planned for 06/30/2023, overnight noted to transition to A-fib with RVR with profound hypoxia during the event with noted pulmonary edema and large volume aspiration -initially placed on BiPAP with worsening respiratory status ultimately intubated for airway protection.  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  Ultimately extubated 07/04/23.  ICU delirium improving over the past week, respiratory status improving, echo showing moderate to severe right ventricular dysfunction.  Patient required transient pressors in the interim likely in setting of sepsis due to large volume aspiration pneumonia.  Patient weaned to nasal cannula, off pressors now transition to p.o. midodrine  and otherwise stable for transfer to hospital floor and out of the ICU.  Hospital course 4/15 admit with coffee ground emesis for inpatient EGD 4/16 afib with RVR, concern for pulm edema, PCCM consult and ICU txfr 4/17: ETT air leak, fixed with more balloon inflation. On Precedex  and Fentanyl  drips for sedation. Upper endoscopy-->Cameron ulcers on hiatal hernia.  A-fib rate controlled on amiodarone . 4/21 Complaints of epigastric pain vs delirium overnight, still on amio gtt/heparin . Tube feeds paused this early this morning.    4/22 Delirium improving, patient had BM, Pulmonary Edema vs BL infiltrates on CXR-, CVP 18 -diuresed with lasix  and metolazone  4/24 Diuresed 3.5 L with metolazone /Lasix  4/26 Placed on BiPAP for increased WOB 4/27 Point-of-care echo showed moderate to severe RV dysfunction.  LV function appears normal.  There is consolidation at both bases with a trivial effusion unsuitable for thoracentesis.  Started on norepinephrine  to support blood pressure as patient required Precedex  for delirium, which exacerbated hypotension 4/28 thoracentesis. Right 100 ml. Exudate by lights. Likely parapneumonic  4/29 off norepi PCXR improved. WBC nml pct neg stopped abx. Feeling better  4/30 weaned down to 4L [place] - transition to [facility]   Assessment & Plan:  Acute hypoxic and hypercapnic respiratory failure Aspiration pneumonia in setting of large hiatal hernia Complicated by volume overload/parapneumonic pleural effusion Completed antibiotic course for aspiration pneumonia coverage Seems to have been weaned off of oxygen.  Saturations are in the mid 90s on room air. Echocardiogram from April showed LVEF of 50 to 55% with LVH.  Diastolic function could not be assessed. Patient remains on IV diuretics, was on 40 mg of furosemide  twice a day.  Now on 20 mg once a day. Has been in negative fluid balance.  Lites and renal function last checked on 5/11.  Will reorder labs for tomorrow morning. Respiratory status is stable.    Acute versus subacute GI bleed Hiatal hernia with oozing Darlene [name] erosions Seen by gastroenterology.  Underwent EGD 4/17 confirms Cameron ulcers. General Surgery was subsequently consulted. Patient underwent repair of the incarcerated paraesophageal hernia.  Also underwent gastrostomy tube placement and reduction of bowel from the incarcerated right lower quadrant hernia. Started on tube feedings by general surgery.  Stable for  the most part.  No further episodes of vomiting.  General surgery  continues to follow. Foley catheter was removed on 5/11.  Has been able to void urine. Remains on a dysphagia 1 diet along with tube feedings.  Hypotension, improving Questionably iatrogenic versus septic shock given above Orthostatic hypotension ongoing Required vasopressors in the ICU. Currently on midodrine .  Continue to monitor.  Blood pressure is stable.   A-fib with RVR On Eliquis  at baseline.  Currently on IV heparin . Patient noted to be on amiodarone .  Required Cardizem  infusion in the OR on 5/7.  Patient remains on amiodarone  and metoprolol .  Heart rate tends to be high mostly in the early morning hours but gets better with medications. Transitioned back to Eliquis .  Hemoglobin has been stable.  Will check labs tomorrow.  Hypokalemia Continue to supplement potassium and magnesium  as indicated.  Check labs tomorrow.   Severe deconditioning Ambulatory dysfunction PT and OT following.  SNF is recommended for short-term rehab.   Hyperglycemia without history of diabetes Continue sliding scale insulin , hypoglycemic protocol A1c previously within normal limits 5.6  Macrocytic anemia Hemoglobin has been stable. Anemia panel shows ferritin of 240, iron of 53, TIBC 328, percent saturation 16.  B12 level 673.  Folic acid  8.3.  DVT prophylaxis: Back on Eliquis  Code Status:   Code Status: Full Code Family Communication: No family at bedside. Disposition: SNF for short-term rehab when medically stable   Consultants:  PCCM, GI, general surgery  Antimicrobials:  Previously completed  Subjective: Patient feels well.  No complaints offered.  Denies any nausea vomiting.  No chest pain or shortness of breath.  Objective: Vitals:   07/26/23 1358 07/26/23 1937 07/27/23 0511 07/27/23 0800  BP: 113/70 124/63 117/72 (!) 110/53  Pulse:  85  90  Resp: 17 16  20   Temp: 97.6 F (36.4 C) 98 F (36.7 C) 98.2 F (36.8 C) 97.6 F (36.4 C)  TempSrc:  Oral Oral Oral  SpO2: 96% 96%  94% 94%  Weight:      Height:        Intake/Output Summary (Last 24 hours) at 07/27/2023 1008 Last data filed at 07/27/2023 0800 Gross per 24 hour  Intake 240 ml  Output 1800 ml  Net -1560 ml   Filed Weights   07/05/23 0522 07/11/23 0500 07/19/23 1547  Weight: 98 kg 96.5 kg 97.5 kg    Examination:  General appearance: Awake alert.  In no distress Resp: Clear to auscultation bilaterally.  Normal effort Cardio: S1-S2 is irregular irregular GI: Abdomen is soft.  G-tube is noted.  Bowel sounds present. No obvious focal neurological deficits.  Data Reviewed:   CBC: Recent Labs  Lab 07/22/23 0351 07/23/23 0101 07/24/23 0445 07/25/23 0400 07/26/23 0255  WBC 12.1* 11.3* 8.6 7.5 10.3  HGB 10.4* 10.3* 10.2* 10.7* 10.8*  HCT 32.0* 32.7* 31.9* 33.8* 34.2*  MCV 100.9* 101.2* 100.6* 101.5* 101.8*  PLT 369 373 308 304 283   Basic Metabolic Panel: Recent Labs  Lab 07/22/23 0351 07/23/23 0101 07/25/23 0400  NA 139 134* 138  K 3.1* 3.7 3.9  CL 99 95* 99  CO2 30 28 31   GLUCOSE 152* 140* 130*  BUN 25* 27* 17  CREATININE 0.90 0.75 0.57  CALCIUM 9.3 8.8* 9.2  MG 1.9 2.0 1.9   GFR: Estimated Creatinine Clearance: 67.4 mL/min (by C-G formula based on SCr of 0.57 mg/dL).  CBG: Recent Labs  Lab 07/26/23 1613 07/26/23 2030 07/27/23 0033 07/27/23 0507 07/27/23 0757  GLUCAP  166* 113* 156* 128* 146*      Radiology Studies: No results found.  Scheduled Meds:  acetaminophen  (TYLENOL ) oral liquid 160 mg/5 mL  650 mg Oral Q6H   amiodarone   200 mg Oral Daily   apixaban   5 mg Oral BID   busPIRone   5 mg Oral TID   Chlorhexidine  Gluconate Cloth  6 each Topical Q0600   docusate  100 mg Oral BID   escitalopram   20 mg Per Tube Daily   feeding supplement  237 mL Oral BID BM   feeding supplement (OSMOLITE 1.5 CAL)  720 mL Per Tube Q24H   free water   100 mL Per Tube Q4H   furosemide   20 mg Intravenous Daily   insulin  aspart  0-20 Units Subcutaneous Q4H   lidocaine   1 patch  Transdermal Q24H   metoCLOPramide   10 mg Oral TID AC & HS   metoprolol  tartrate  12.5 mg Oral BID   midodrine   10 mg Oral Q8H   mouth rinse  15 mL Mouth Rinse 4 times per day   pantoprazole   40 mg Oral BID   polyethylene glycol  17 g Oral Daily   sodium chloride  flush  10-40 mL Intracatheter Q12H   traZODone  50 mg Oral QHS   Continuous Infusions:     LOS: 27 days     Maylene Spear,  [facility]  If 7PM-7AM, please contact night-coverage [email or web address]  07/27/2023, 10:08 AM

## 2023-07-28 DIAGNOSIS — K922 Gastrointestinal hemorrhage, unspecified: Secondary | ICD-10-CM | POA: Diagnosis not present

## 2023-07-28 LAB — GLUCOSE, CAPILLARY
Glucose-Capillary: 118 mg/dL — ABNORMAL HIGH (ref 70–99)
Glucose-Capillary: 125 mg/dL — ABNORMAL HIGH (ref 70–99)
Glucose-Capillary: 131 mg/dL — ABNORMAL HIGH (ref 70–99)
Glucose-Capillary: 152 mg/dL — ABNORMAL HIGH (ref 70–99)
Glucose-Capillary: 163 mg/dL — ABNORMAL HIGH (ref 70–99)
Glucose-Capillary: 88 mg/dL (ref 70–99)

## 2023-07-28 LAB — CBC
HCT: 32.4 % — ABNORMAL LOW (ref 36.0–46.0)
Hemoglobin: 10.2 g/dL — ABNORMAL LOW (ref 12.0–15.0)
MCH: 32.2 pg (ref 26.0–34.0)
MCHC: 31.5 g/dL (ref 30.0–36.0)
MCV: 102.2 fL — ABNORMAL HIGH (ref 80.0–100.0)
Platelets: 250 10*3/uL (ref 150–400)
RBC: 3.17 MIL/uL — ABNORMAL LOW (ref 3.87–5.11)
RDW: 14.3 % (ref 11.5–15.5)
WBC: 7.8 10*3/uL (ref 4.0–10.5)
nRBC: 0 % (ref 0.0–0.2)

## 2023-07-28 LAB — COMPREHENSIVE METABOLIC PANEL WITH GFR
ALT: 99 U/L — ABNORMAL HIGH (ref 0–44)
AST: 56 U/L — ABNORMAL HIGH (ref 15–41)
Albumin: 2.3 g/dL — ABNORMAL LOW (ref 3.5–5.0)
Alkaline Phosphatase: 103 U/L (ref 38–126)
Anion gap: 6 (ref 5–15)
BUN: 20 mg/dL (ref 8–23)
CO2: 29 mmol/L (ref 22–32)
Calcium: 9 mg/dL (ref 8.9–10.3)
Chloride: 101 mmol/L (ref 98–111)
Creatinine, Ser: 0.65 mg/dL (ref 0.44–1.00)
GFR, Estimated: >60 mL/min (ref >60)
Glucose, Bld: 157 mg/dL — ABNORMAL HIGH (ref 70–99)
Potassium: 4.1 mmol/L (ref 3.5–5.1)
Sodium: 136 mmol/L (ref 135–145)
Total Bilirubin: 0.4 mg/dL (ref 0.0–1.2)
Total Protein: 5.2 g/dL — ABNORMAL LOW (ref 6.5–8.1)

## 2023-07-28 LAB — MAGNESIUM: Magnesium: 1.9 mg/dL (ref 1.7–2.4)

## 2023-07-28 NOTE — Progress Notes (Signed)
 Calorie Count Note  48 hour calorie count ordered.  Diet: Dysphagia 1, Thin Supplements: Ensure Enlive - BID, Magic Cup - BID  Estimated Nutritional Needs:  Kcal:  1700-1900 Protein:  85-100g Fluid:  >/= 1.7L  Day 1 - Results 5/13 Lunch: 214 calories, 1 gm protein 5/13 Dinner: 80 calories, 1 gm protein 5/14 Breakfast: 270 calories, 14 gm protein Supplements: 2 Ensure's given, unsure completion  Total intake: 564 kcal (33% of minimum estimated needs)  16 protein (19% of minimum estimated needs)  Nutrition Diagnosis: Inadequate oral intake related to altered GI function (large hiatal hernia; cameron erosions) as evidenced by NPO status   Goal: Patient will meet greater than or equal to 90% of their needs   Intervention:   Nocturnal Tube Feeds via G-tube: Osmolite 1.5 at 60 mL/hr x 12 hours from 1800 to 0600 (720 mL/hr) Free water  flush: 100 mL q4h  Regimen provides 1080 kcal (meets 64% of estimated needs), 45 gm protein (meets 53% of estimated needs), 1149 mL total free water  daily. Ensure Enlive po BID, each supplement provides 350 kcal and 20 grams of protein. Magic cup BID with meals, each supplement provides 290 kcal and 9 grams of protein   Doneta Furbish RD, LDN Clinical Dietitian

## 2023-07-28 NOTE — Progress Notes (Signed)
 Physical Therapy Treatment Patient Details Name: Darlene [name] MRN: [id number] DOB: 1940-09-12 Today's Date: 07/28/2023   History of Present Illness 83 yo female admitted 06/29/23 with acute hypoxic hypercapnic respiratory failure in the setting of aspiration complicated by GI bleed. Intubated 4/16-4/20. 4/17 EGD. Now s/p repair of Type III incarcerated paraesophageal hernia, gastropexy, gastrostomy tube placement, reduction of bowel from incarcerated RLQ hernia and LOA on 5/7. PMhx: depression, DJD, OSA, pulmonary HTN, obesity, HLD, Afib.    PT Comments  Pt is able to progress to transfers out of bed to recliner although still reporting symptoms of lightheadedness with standing. BP's are stable during session in sitting, pre and post-mobility. Pt demonstrates improvement in quality of bed mobility and requires less assistance during transfers. PT provides further education on the need for continued exposure to upright positioning to help with BP management. Patient will benefit from continued inpatient follow up therapy, <3 hours/day.   If plan is discharge home, recommend the following: A lot of help with bathing/dressing/bathroom;Assistance with cooking/housework;Direct supervision/assist for financial management;Assist for transportation;Two people to help with walking and/or transfers;Direct supervision/assist for medications management   Can travel by private vehicle     No  Equipment Recommendations  Hospital bed;Wheelchair (measurements PT)    Recommendations for Other Services       Precautions / Restrictions Precautions Precautions: Fall Recall of Precautions/Restrictions: Impaired Precaution/Restrictions Comments: orthostatic symptoms, SpO2, Gtube Required Braces or Orthoses:  (ace wraps BLE, abdominal binder) Restrictions Weight Bearing Restrictions Per Provider Order: No     Mobility  Bed Mobility Overal bed mobility: Needs Assistance Bed Mobility: Rolling, Sidelying  to Sit Rolling: Min assist Sidelying to sit: Min assist            Transfers Overall transfer level: Needs assistance Equipment used: Rolling walker (2 wheels) Transfers: Sit to/from Stand Sit to Stand: Min assist   Step pivot transfers: Min assist       General transfer comment: verbal cueing for hand placement and to facilitate anterior weight shift during transfers    Ambulation/Gait             Pre-gait activities: pt tolerates a brief 5 second period of marching in place, unable to progress to ambulation away from recliner due to reports of lightheadedness     Stairs             Wheelchair Mobility     Tilt Bed    Modified Rankin (Stroke Patients Only)       Balance Overall balance assessment: Needs assistance Sitting-balance support: No upper extremity supported, Feet supported Sitting balance-Leahy Scale: Fair     Standing balance support: Bilateral upper extremity supported, Reliant on assistive device for balance Standing balance-Leahy Scale: Poor                              Communication Communication Communication: No apparent difficulties  Cognition Arousal: Alert Behavior During Therapy: WFL for tasks assessed/performed   PT - Cognitive impairments: No apparent impairments                       PT - Cognition Comments: appropriate for sessio but no formal assessment Following commands: Intact      Cueing Cueing Techniques: Verbal cues, Gestural cues, Tactile cues  Exercises      General Comments General comments (skin integrity, edema, etc.): BP pre-mobility is 107/52, BP after initial standing attempt from recliner  is 100/71, BP at end of session seated in recliner is 107/59. Pt HR fluctuating from 100-140 with activity      Pertinent Vitals/Pain Pain Assessment Pain Assessment: Faces Faces Pain Scale: Hurts little more Pain Location: abdomen Pain Descriptors / Indicators: Sore Pain  Intervention(s): Monitored during session    Home Living                          Prior Function            PT Goals (current goals can now be found in the care plan section) Acute Rehab PT Goals Patient Stated Goal: return home PT Goal Formulation: With patient/family Time For Goal Achievement: 08/11/23 Potential to Achieve Goals: Fair Progress towards PT goals: Progressing toward goals    Frequency    Min 2X/week      PT Plan      Co-evaluation              AM-PAC PT "6 Clicks" Mobility   Outcome Measure  Help needed turning from your back to your side while in a flat bed without using bedrails?: A Little Help needed moving from lying on your back to sitting on the side of a flat bed without using bedrails?: A Little Help needed moving to and from a bed to a chair (including a wheelchair)?: A Little Help needed standing up from a chair using your arms (e.g., wheelchair or bedside chair)?: A Little Help needed to walk in hospital room?: Total Help needed climbing 3-5 steps with a railing? : Total 6 Click Score: 14    End of Session   Activity Tolerance: Patient tolerated treatment well (improved tolerance for activity but still limited by lightheadedness) Patient left: in chair;with call bell/phone within reach;with chair alarm set Nurse Communication: Mobility status PT Visit Diagnosis: Unsteadiness on feet (R26.81);Other abnormalities of gait and mobility (R26.89);Muscle weakness (generalized) (M62.81)     Time: 1000-1046 PT Time Calculation (min) (ACUTE ONLY): 46 min  Charges:    $Therapeutic Activity: 38-52 mins PT General Charges $$ ACUTE PT VISIT: 1 Visit                     Rexie Catena, PT, DPT Acute Rehabilitation Office [phone]    Rexie Catena 07/28/2023, 12:30 PM

## 2023-07-28 NOTE — Progress Notes (Signed)
 [facility] Progress Note Patient: Darlene [name] UJW:[id number] DOB: 1940-11-10 DOA: 06/29/2023  DOS: the patient was seen and examined on 07/28/2023  Brief Hospital Course: Darlene [name] is a 83 y.o. F with PMH significant for atrial fibrillation on eliquis , OSA on CPAP, HTN, HLD, GERD and esophageal strictures.  She presents to our facility with ongoing coffee-ground emesis on 06/29/2023.  CT abdomen pelvis with contrast not revealing of any acute source of bleeding although noted high-grade stenosis of the celiac axis and collateral flow from the superior mesenteric artery-patient previously scheduled for hiatal hernia and spigelian hernia repair with Dr. Jamse Mcgee later this month.  Initially admitted for EGD planned for 06/30/2023, overnight noted to transition to A-fib with RVR with profound hypoxia during the event with noted pulmonary edema and large volume aspiration -initially placed on BiPAP with worsening respiratory status ultimately intubated for airway protection.  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  Ultimately extubated 07/04/23.  ICU delirium improving over the past week, respiratory status improving, echo showing moderate to severe right ventricular dysfunction.  Patient required transient pressors in the interim likely in setting of sepsis due to large volume aspiration pneumonia.  Patient weaned to nasal cannula, off pressors now transition to p.o. midodrine  and otherwise stable for transfer to hospital floor and out of the ICU.  Currently awaiting calorie count initiated by surgery.   Hospital course 4/15 admit with coffee ground emesis for inpatient EGD 4/16 afib with RVR, concern for pulm edema, PCCM consult and ICU txfr 4/17: ETT air leak, fixed with more balloon inflation. On Precedex  and Fentanyl  drips for sedation. Upper endoscopy-->Cameron ulcers on hiatal hernia.  A-fib rate controlled on amiodarone . 4/21 Complaints of  epigastric pain vs delirium overnight, still on amio gtt/heparin . Tube feeds paused this early this morning.   4/22 Delirium improving, patient had BM, Pulmonary Edema vs BL infiltrates on CXR-, CVP 18 -diuresed with lasix  and metolazone  4/24 Diuresed 3.5 L with metolazone /Lasix  4/26 Placed on BiPAP for increased WOB 4/27 Point-of-care echo showed moderate to severe RV dysfunction.  LV function appears normal.  There is consolidation at both bases with a trivial effusion unsuitable for thoracentesis.  Started on norepinephrine  to support blood pressure as patient required Precedex  for delirium, which exacerbated hypotension 4/28 thoracentesis. Right 100 ml. Exudate by lights. Likely parapneumonic  4/29 off norepi PCXR improved. WBC nml pct neg stopped abx. Feeling better  4/30 weaned down to 4L [place] - transition to [facility]     Assessment & Plan:   Acute hypoxic and hypercapnic respiratory failure Aspiration pneumonia in setting of large hiatal hernia Complicated by volume overload/parapneumonic pleural effusion Completed antibiotic course for aspiration pneumonia coverage Seems to have been weaned off of oxygen.  Saturations are in the mid 90s on room air. Echocardiogram from April showed LVEF of 50 to 55% with LVH.  Diastolic function could not be assessed. Patient remains on IV diuretics, was on 40 mg of furosemide  twice a day.  Now on 20 mg once a day. Has been in negative fluid balance.   Acute versus subacute GI bleed Hiatal hernia with oozing Darlene [name] erosions Seen by gastroenterology.  Underwent EGD 4/17 confirms Cameron ulcers. General Surgery was subsequently consulted. Patient underwent repair of the incarcerated paraesophageal hernia.  Also underwent gastrostomy tube placement and reduction of bowel from the incarcerated right lower quadrant hernia. Started on tube feedings by general surgery.  Stable for the most part.  No further  episodes of vomiting.  General surgery continues to  follow. Foley catheter was removed on 5/11.  Has been able to void urine. Remains on a dysphagia 1 diet along with tube feedings.   Hypotension, improving Questionably iatrogenic versus septic shock given above Orthostatic hypotension ongoing Required vasopressors in the ICU. Currently on midodrine .  Continue to monitor.  Blood pressure is stable.   A-fib with RVR On Eliquis  at baseline.  Currently on IV heparin . Patient noted to be on amiodarone .  Required Cardizem  infusion in the OR on 5/7.  Patient remains on amiodarone  and metoprolol .  Heart rate tends to be high mostly in the early morning hours but gets better with medications. Transitioned back to Eliquis .  Hemoglobin has been stable.   Hypokalemia Continue to supplement potassium and magnesium  as indicated.   Severe deconditioning Ambulatory dysfunction PT and OT following.  SNF is recommended for short-term rehab.   Hyperglycemia without history of diabetes Continue sliding scale insulin , hypoglycemic protocol A1c previously within normal limits 5.6   Macrocytic anemia Hemoglobin has been stable. Anemia panel shows ferritin of 240, iron of 53, TIBC 328, percent saturation 16.  B12 level 673.  Folic acid  8.3.  Subjective: No nausea no vomiting.  No fever no chills.  Breathing okay.  Physical Exam: General: in Mild distress, No Rash Cardiovascular: S1 and S2 Present, No Murmur Respiratory: Good respiratory effort, Bilateral Air entry present. No Crackles, No wheezes Abdomen: Bowel Sound present, No tenderness Extremities: Trace edema Neuro: Alert and oriented x3, no new focal deficit  Data Reviewed: I have Reviewed nursing notes, Vitals, and Lab results. Since last encounter, pertinent lab results CBC and BMP   . I have ordered test including CBC and BMP  .   Disposition: Status is: Inpatient Remains inpatient appropriate because: Monitor for improvement in oral intake  Place TED hose Start: 07/22/23  1251 Place and maintain sequential compression device Start: 07/01/23 0737 SCDs Start: 06/29/23 1509 apixaban  (ELIQUIS ) tablet 5 mg   Family Communication: No one at bedside Level of care: Telemetry Medical   Vitals:   07/28/23 1157 07/28/23 1444 07/28/23 1957 07/28/23 1959  BP: (!) 102/43 (!) 102/58 (!) 83/68 (!) 93/40  Pulse: 86 87 94   Resp:   18   Temp:  98 F (36.7 C) 98.5 F (36.9 C)   TempSrc:  Oral Oral   SpO2:  97% 98%   Weight:      Height:         Author: Charlean Congress, MD 07/28/2023 8:32 PM  Please look on [email or web address] to find out who is on call.

## 2023-07-28 NOTE — TOC Progression Note (Addendum)
 Transition of Care [facility]) - Progression Note    Patient Details  Name: TRENNY STENSETH MRN: [id number] Date of Birth: 1940-11-15  Transition of [facility]) CM/SW Contact  Elspeth Hals, LCSW Phone Number: 07/28/2023, 8:31 AM  Clinical Narrative:   SNF auth request approved in [place]: [id number], 3 days: 5/14-5/16.  CSW confirmed with [facility] that they can receive pt today.  MD informed.   1145: Per MD, no DC today.  SNF informed. Pt and daughter Adah Acron updated in room.   Expected Discharge Plan: Skilled Nursing Facility Barriers to Discharge: English as a second language teacher, Continued Medical Work up  Expected Discharge Plan and Services In-house Referral: Clinical Social Work   Post Acute Care Choice:  (TBD) Living arrangements for the past 2 months: Single Family Home                                       Social Determinants of Health (SDOH) Interventions SDOH Screenings   Food Insecurity: No Food Insecurity (06/29/2023)  Housing: Low Risk  (06/29/2023)  Transportation Needs: No Transportation Needs (06/29/2023)  Utilities: Not At Risk (06/29/2023)  Alcohol Screen: Low Risk  (04/28/2019)  Depression (PHQ2-9): Medium Risk (06/15/2023)  Financial Resource Strain: Low Risk  (02/03/2021)  Physical Activity: Inactive (04/28/2019)  Social Connections: Moderately Isolated (06/29/2023)  Stress: No Stress Concern Present (04/28/2019)  Tobacco Use: Low Risk  (07/21/2023)    Readmission Risk Interventions     No data to display

## 2023-07-28 NOTE — Progress Notes (Signed)
 Occupational Therapy Treatment Patient Details Name: DEVANSHI CRUSE MRN: [id number] DOB: 01-10-1941 Today's Date: 07/28/2023   History of present illness 83 yo female admitted 06/29/23 with acute hypoxic hypercapnic respiratory failure in the setting of aspiration complicated by GI bleed. Intubated 4/16-4/20. 4/17 EGD. Now s/p repair of Type III incarcerated paraesophageal hernia, gastropexy, gastrostomy tube placement, reduction of bowel from incarcerated RLQ hernia and LOA on 5/7. PMhx: depression, DJD, OSA, pulmonary HTN, obesity, HLD, Afib.   OT comments  Pt in recliner upon entry.  Reports fatigued from PT session but agreeable to participate.  She repositioned to upright in recliner, with noted symptomatic lightheaded and BP at 82/65. Reclined back and BP increased with time to 100/61. Engaged in grooming tasks from reclined position, up to min assist due to UE fatigue.  Will continue to benefit from OT services, will follow and recommend follow up at inpatient setting with <3hrs/day.       If plan is discharge home, recommend the following:  Two people to help with walking and/or transfers;A lot of help with bathing/dressing/bathroom;Assistance with cooking/housework;Assist for transportation;Help with stairs or ramp for entrance;Direct supervision/assist for medications management;Direct supervision/assist for financial management   Equipment Recommendations  BSC/3in1    Recommendations for Other Services      Precautions / Restrictions Precautions Precautions: Fall Recall of Precautions/Restrictions: Impaired Precaution/Restrictions Comments: orthostatic symptoms, SpO2, Gtube Required Braces or Orthoses:  (ace wraps BLE, abdominal binder) Restrictions Weight Bearing Restrictions Per Provider Order: No       Mobility Bed Mobility               General bed mobility comments: in recliner    Transfers                         Balance                                            ADL either performed or assessed with clinical judgement   ADL Overall ADL's : Needs assistance/impaired     Grooming: Minimal assistance;Brushing hair;Sitting Grooming Details (indicate cue type and reason): reclined in recliner, min assist for shampoo cap due to UEs fatiguing         Upper Body Dressing : Minimal assistance;Sitting                          Extremity/Trunk Assessment              Vision       Perception     Praxis     Communication Communication Communication: No apparent difficulties   Cognition Arousal: Alert Behavior During Therapy: WFL for tasks assessed/performed Cognition: No family/caregiver present to determine baseline             OT - Cognition Comments: not formally assessed, but some slow processing, decreased problem solving and decreased  awareness (esp when BP drops)                 Following commands: Impaired Following commands impaired: Follows multi-step commands inconsistently, Follows one step commands inconsistently      Cueing   Cueing Techniques: Verbal cues  Exercises      Shoulder Instructions       General Comments Pt reclined in recliner upon entry, sat upright without assist and  assessed BP 82/65 (72) as pt symptomatic, reclined back to BP increaed back to 100/61 (73)    Pertinent Vitals/ Pain       Pain Assessment Pain Assessment: Faces Faces Pain Scale: Hurts a little bit Pain Location: abdomen Pain Descriptors / Indicators: Sore Pain Intervention(s): Limited activity within patient's tolerance, Monitored during session, Repositioned  Home Living                                          Prior Functioning/Environment              Frequency  Min 2X/week        Progress Toward Goals  OT Goals(current goals can now be found in the care plan section)  Progress towards OT goals: Progressing toward goals  Acute Rehab  OT Goals Patient Stated Goal: rehab OT Goal Formulation: With patient Time For Goal Achievement: 08/06/23 Potential to Achieve Goals: Good  Plan      Co-evaluation                 AM-PAC OT "6 Clicks" Daily Activity     Outcome Measure   Help from another person eating meals?: A Little Help from another person taking care of personal grooming?: A Little Help from another person toileting, which includes using toliet, bedpan, or urinal?: A Lot Help from another person bathing (including washing, rinsing, drying)?: A Lot Help from another person to put on and taking off regular upper body clothing?: A Little Help from another person to put on and taking off regular lower body clothing?: Total 6 Click Score: 14    End of Session    OT Visit Diagnosis: Unsteadiness on feet (R26.81);Other abnormalities of gait and mobility (R26.89);Muscle weakness (generalized) (M62.81)   Activity Tolerance Patient limited by fatigue   Patient Left in chair;with call bell/phone within reach;with chair alarm set;with family/visitor present   Nurse Communication Mobility status;Other (comment) (BP)        Time: 3329-5188 OT Time Calculation (min): 29 min  Charges: OT General Charges $OT Visit: 1 Visit OT Treatments $Self Care/Home Management : 23-37 mins  Bary Boss, OT Acute Rehabilitation Services Office [phone] Secure Chat Preferred    Fredrich Jefferson 07/28/2023, 1:21 PM

## 2023-07-28 NOTE — Progress Notes (Signed)
 Spoke with primary RN and Dr. Lydia Sams. Ok to remove PICC before discharge on 5/15.

## 2023-07-28 NOTE — Progress Notes (Signed)
 7 Days Post-Op   Subjective/Chief Complaint: No further emesis Tolerated nocturnal tube feeds Had BM   Objective: Vital signs in last 24 hours: Temp:  [98 F (36.7 C)-98.4 F (36.9 C)] 98 F (36.7 C) (05/14 0808) Pulse Rate:  [94-106] 106 (05/14 0808) Resp:  [17-18] 17 (05/14 0438) BP: (103-116)/(61-68) 116/68 (05/14 0808) SpO2:  [95 %-98 %] 96 % (05/14 0808) Last BM Date : 07/27/23  Intake/Output from previous day: 05/13 0701 - 05/14 0700 In: 360 [P.O.:360] Out: 1350 [Urine:1350] Intake/Output this shift: No intake/output data recorded.  Exam: Awake and alert Abdomen soft, minimally tender, incisions clean, G-tube site clean  Lab Results:  Recent Labs    07/26/23 0255 07/28/23 0314  WBC 10.3 7.8  HGB 10.8* 10.2*  HCT 34.2* 32.4*  PLT 283 250   BMET Recent Labs    07/28/23 0314  NA 136  K 4.1  CL 101  CO2 29  GLUCOSE 157*  BUN 20  CREATININE 0.65  CALCIUM 9.0   PT/INR No results for input(s): "LABPROT", "INR" in the last 72 hours. ABG No results for input(s): "PHART", "HCO3" in the last 72 hours.  Invalid input(s): "PCO2", "PO2"  Studies/Results: No results found.  Anti-infectives: Anti-infectives (From admission, onward)    Start     Dose/Rate Route Frequency Ordered Stop   07/20/23 1015  cefTRIAXone  (ROCEPHIN ) 2 g in sodium chloride  0.9 % 100 mL IVPB  Status:  Discontinued       Note to Pharmacy: Pharmacy may adjust dosing strength / duration / interval for maximal efficacy   2 g 200 mL/hr over 30 Minutes Intravenous On call to O.R. 07/20/23 0920 07/21/23 0559   07/09/23 0945  piperacillin -tazobactam (ZOSYN ) IVPB 3.375 g  Status:  Discontinued        3.375 g 12.5 mL/hr over 240 Minutes Intravenous Every 8 hours 07/09/23 0845 07/13/23 0842   07/05/23 0915  cefTRIAXone  (ROCEPHIN ) 2 g in sodium chloride  0.9 % 100 mL IVPB  Status:  Discontinued        2 g 200 mL/hr over 30 Minutes Intravenous Every 24 hours 07/05/23 0815 07/09/23 0845    06/30/23 2100  piperacillin -tazobactam (ZOSYN ) IVPB 3.375 g       Placed in "Followed by" Linked Group   3.375 g 12.5 mL/hr over 240 Minutes Intravenous Every 8 hours 06/30/23 1356 07/03/23 0016   06/30/23 1445  piperacillin -tazobactam (ZOSYN ) IVPB 3.375 g       Placed in "Followed by" Linked Group   3.375 g 100 mL/hr over 30 Minutes Intravenous  Once 06/30/23 1356 06/30/23 1626       Assessment/Plan: POD 7 s/p Robotic repair of Type III incarcerated paraesophageal hernia, gastropexy, gastrostomy tube placement, reduction of bowel from incarcerated right lower quadrant hernia, lysis of adhesions x53min Dr. Lanell Pinta 5/7    Doing better WBC normal, hgb stable She is ready for a SNF from a surgical standpoint  Oza Blumenthal MD 07/28/2023

## 2023-07-29 LAB — CBC
HCT: 32.1 % — ABNORMAL LOW (ref 36.0–46.0)
Hemoglobin: 10.3 g/dL — ABNORMAL LOW (ref 12.0–15.0)
MCH: 32.1 pg (ref 26.0–34.0)
MCHC: 32.1 g/dL (ref 30.0–36.0)
MCV: 100 fL (ref 80.0–100.0)
Platelets: 241 10*3/uL (ref 150–400)
RBC: 3.21 MIL/uL — ABNORMAL LOW (ref 3.87–5.11)
RDW: 14.5 % (ref 11.5–15.5)
WBC: 8.1 10*3/uL (ref 4.0–10.5)
nRBC: 0 % (ref 0.0–0.2)

## 2023-07-29 LAB — BASIC METABOLIC PANEL WITH GFR
Anion gap: 8 (ref 5–15)
BUN: 20 mg/dL (ref 8–23)
CO2: 27 mmol/L (ref 22–32)
Calcium: 9.2 mg/dL (ref 8.9–10.3)
Chloride: 101 mmol/L (ref 98–111)
Creatinine, Ser: 0.61 mg/dL (ref 0.44–1.00)
GFR, Estimated: >60 mL/min (ref >60)
Glucose, Bld: 151 mg/dL — ABNORMAL HIGH (ref 70–99)
Potassium: 4.1 mmol/L (ref 3.5–5.1)
Sodium: 136 mmol/L (ref 135–145)

## 2023-07-29 LAB — GLUCOSE, CAPILLARY
Glucose-Capillary: 100 mg/dL — ABNORMAL HIGH (ref 70–99)
Glucose-Capillary: 126 mg/dL — ABNORMAL HIGH (ref 70–99)
Glucose-Capillary: 147 mg/dL — ABNORMAL HIGH (ref 70–99)
Glucose-Capillary: 159 mg/dL — ABNORMAL HIGH (ref 70–99)
Glucose-Capillary: 159 mg/dL — ABNORMAL HIGH (ref 70–99)

## 2023-07-29 MED ORDER — METOCLOPRAMIDE HCL 10 MG PO TABS: 10.0000 mg | ORAL_TABLET | Freq: Three times a day (TID) | ORAL | 0 refills | Status: DC

## 2023-07-29 MED ORDER — LIDOCAINE 5 % EX PTCH: 1.0000 | MEDICATED_PATCH | CUTANEOUS | 0 refills | Status: DC

## 2023-07-29 MED ORDER — ENSURE ENLIVE PO LIQD: 237.0000 mL | Freq: Two times a day (BID) | ORAL | 0 refills | Status: DC

## 2023-07-29 MED ORDER — BUSPIRONE HCL 5 MG PO TABS: 5.0000 mg | ORAL_TABLET | Freq: Three times a day (TID) | ORAL | 0 refills | Status: DC

## 2023-07-29 MED ORDER — BISACODYL 10 MG RE SUPP: 10.0000 mg | Freq: Every day | RECTAL | 0 refills | Status: DC | PRN

## 2023-07-29 MED ORDER — METOPROLOL SUCCINATE ER 25 MG PO TB24: 12.5000 mg | ORAL_TABLET | Freq: Every day | ORAL | 0 refills | Status: DC

## 2023-07-29 MED ORDER — MIDODRINE HCL 10 MG PO TABS: 10.0000 mg | ORAL_TABLET | Freq: Three times a day (TID) | ORAL | 0 refills | Status: DC

## 2023-07-29 MED ORDER — DOCUSATE SODIUM 50 MG/5ML PO LIQD: 100.0000 mg | Freq: Two times a day (BID) | ORAL | 0 refills | Status: DC

## 2023-07-29 MED ORDER — OSMOLITE 1.5 CAL PO LIQD: 720.0000 mL | ORAL | 0 refills | Status: DC

## 2023-07-29 MED ORDER — INSULIN ASPART 100 UNIT/ML IJ SOLN
0.0000 [IU] | INTRAMUSCULAR | Status: DC
Start: 2023-07-29 — End: 2023-08-17

## 2023-07-29 MED ORDER — TRAMADOL HCL 50 MG PO TABS: 50.0000 mg | ORAL_TABLET | Freq: Four times a day (QID) | ORAL | 0 refills | Status: DC | PRN

## 2023-07-29 MED ORDER — POLYETHYLENE GLYCOL 3350 17 G PO PACK: 17.0000 g | PACK | Freq: Every day | ORAL | 0 refills | Status: DC

## 2023-07-29 MED ORDER — FUROSEMIDE 20 MG PO TABS: 20.0000 mg | ORAL_TABLET | Freq: Every day | ORAL | 0 refills | Status: DC

## 2023-07-29 MED ORDER — FREE WATER: 100.0000 mL | Status: DC

## 2023-07-29 MED ORDER — AMIODARONE HCL 200 MG PO TABS
200.0000 mg | ORAL_TABLET | Freq: Every day | ORAL | 0 refills | Status: DC
Start: 2023-07-30 — End: 2023-09-15

## 2023-07-29 NOTE — Progress Notes (Signed)
 Called in report to [facility] facility and spoke with Jyl Or, LPN.

## 2023-07-29 NOTE — Progress Notes (Signed)
 Calorie Count Note  48 hour calorie count ordered.  Recommend continue:   Nocturnal Tube Feeds via G-tube: Osmolite 1.5 at 60 mL/hr x 12 hours from 1800 to 0600 (720 mL/hr) Free water  flush: 100 mL q4h  Regimen provides 1080 kcal (meets 64% of estimated needs), 45 gm protein (meets 53% of estimated needs), 1149 mL total free water  daily.   Diet: Dysphagia 1 with thin liquids Supplements: Ensure Plus High Protein BID  Spoke with pt who reports she does not like the pureed food and has no appetite. She has been drinking about 1 ensure per day, eating soup and jello.  Pt agreeable to continue nocturnal TF at SNF until they are no longer needed.   Total intake: 800 kcal (47% of minimum estimated needs)  27 protein (31% of minimum estimated needs)   Randine Butcher., RD, LDN, CNSC See AMiON for contact information

## 2023-07-29 NOTE — Progress Notes (Addendum)
 8 Days Post-Op  Subjective: CC: Reports she is not eating much. Doesn't like d1 diet. Had parfait from [employer] this am. 1 ensure yesterday. No abdominal pain, n/v. BM yesterday. Voiding. Working with PT.   TRH thinks soft bp and tachycardia yesterday was from diuresis. Normotensive (SBP) w/ HR in the 80's today. Afebrile. WBC wnl. Hgb stable.   Objective: Vital signs in last 24 hours: Temp:  [97.9 F (36.6 C)-98.7 F (37.1 C)] 97.9 F (36.6 C) (05/15 0715) Pulse Rate:  [75-96] 75 (05/15 0715) Resp:  [16-18] 16 (05/15 0715) BP: (83-108)/(39-70) 108/48 (05/15 0715) SpO2:  [96 %-98 %] 97 % (05/15 0715) Last BM Date : 07/28/23  Intake/Output from previous day: 05/14 0701 - 05/15 0700 In: 450 [P.O.:450] Out: 1000 [Urine:1000] Intake/Output this shift: No intake/output data recorded.  PE: Gen:  Alert, NAD, pleasant Abd: Soft, no distension, NT, +BS. Incisions with glue intact appears well and are without drainage, bleeding, or signs of infection. G-tube site cdi and currently clamped.   Lab Results:  Recent Labs    07/28/23 0314 07/29/23 0445  WBC 7.8 8.1  HGB 10.2* 10.3*  HCT 32.4* 32.1*  PLT 250 241   BMET Recent Labs    07/28/23 0314 07/29/23 0445  NA 136 136  K 4.1 4.1  CL 101 101  CO2 29 27  GLUCOSE 157* 151*  BUN 20 20  CREATININE 0.65 0.61  CALCIUM 9.0 9.2   PT/INR No results for input(s): "LABPROT", "INR" in the last 72 hours. CMP     Component Value Date/Time   NA 136 07/29/2023 0445   K 4.1 07/29/2023 0445   CL 101 07/29/2023 0445   CO2 27 07/29/2023 0445   GLUCOSE 151 (H) 07/29/2023 0445   BUN 20 07/29/2023 0445   CREATININE 0.61 07/29/2023 0445   CREATININE 0.72 02/16/2023 1045   CALCIUM 9.2 07/29/2023 0445   PROT 5.2 (L) 07/28/2023 0314   ALBUMIN  2.3 (L) 07/28/2023 0314   AST 56 (H) 07/28/2023 0314   ALT 99 (H) 07/28/2023 0314   ALKPHOS 103 07/28/2023 0314   BILITOT 0.4 07/28/2023 0314   GFRNONAA >60 07/29/2023 0445   GFRNONAA  75 04/03/2020 1030   GFRAA 87 04/03/2020 1030   Lipase     Component Value Date/Time   LIPASE 24 06/28/2023 1806    Studies/Results: No results found.  Anti-infectives: Anti-infectives (From admission, onward)    Start     Dose/Rate Route Frequency Ordered Stop   07/20/23 1015  cefTRIAXone  (ROCEPHIN ) 2 g in sodium chloride  0.9 % 100 mL IVPB  Status:  Discontinued       Note to Pharmacy: Pharmacy may adjust dosing strength / duration / interval for maximal efficacy   2 g 200 mL/hr over 30 Minutes Intravenous On call to O.R. 07/20/23 0920 07/21/23 0559   07/09/23 0945  piperacillin -tazobactam (ZOSYN ) IVPB 3.375 g  Status:  Discontinued        3.375 g 12.5 mL/hr over 240 Minutes Intravenous Every 8 hours 07/09/23 0845 07/13/23 0842   07/05/23 0915  cefTRIAXone  (ROCEPHIN ) 2 g in sodium chloride  0.9 % 100 mL IVPB  Status:  Discontinued        2 g 200 mL/hr over 30 Minutes Intravenous Every 24 hours 07/05/23 0815 07/09/23 0845   06/30/23 2100  piperacillin -tazobactam (ZOSYN ) IVPB 3.375 g       Placed in "Followed by" Linked Group   3.375 g 12.5 mL/hr over 240 Minutes Intravenous Every 8  hours 06/30/23 1356 07/03/23 0016   06/30/23 1445  piperacillin -tazobactam (ZOSYN ) IVPB 3.375 g       Placed in "Followed by" Linked Group   3.375 g 100 mL/hr over 30 Minutes Intravenous  Once 06/30/23 1356 06/30/23 1626        Assessment/Plan POD 8 s/p Robotic repair of Type III incarcerated paraesophageal hernia, gastropexy, gastrostomy tube placement, reduction of bowel from incarcerated right lower quadrant hernia, lysis of adhesions x76min Dr. Lanell Pinta 5/7  - Suspect we can d/c her to SNF today on supplemental nocturnal tf's and follow up in the office.  - Follow up arranged. Discussed discharge instructions, restrictions and return/call back precautions.   FEN - D1, TF's VTE - SCDs, Eliquis  ID - None. Afebrile. WBC wnl.     LOS: 29 days    Delton Filbert, [facility]  Surgery 07/29/2023, 11:07 AM Please see Amion for pager number during day hours 7:00am-4:30pm

## 2023-07-29 NOTE — Care Management Important Message (Signed)
 Important Message  Patient Details  Name: Darlene [name] MRN: [id number] Date of Birth: Jun 06, 1940   Important Message Given:  Yes - Medicare IM     Felix Host 07/29/2023, 10:47 AM

## 2023-07-29 NOTE — TOC Transition Note (Signed)
 Transition of Care [facility]) - Discharge Note   Patient Details  Name: Darlene [name] MRN: [id number] Date of Birth: [birth year]  Transition of Care [facility]) CM/SW Contact:  Elspeth Hals, LCSW Phone Number: 07/29/2023, 1:13 PM   Clinical Narrative:  Pt discharging to [facility].  RN call report to  [phone].  PTAR called [phone].     1255: CSW confirmed with [facility] that they can receive pt today.     Final next level of care: Skilled Nursing Facility Barriers to Discharge: Barriers Resolved   Patient Goals and CMS Choice Patient states their goals for this hospitalization and ongoing recovery are:: back to normal CMS Medicare.gov Compare Post Acute Care list provided to:: Patient Represenative (must comment) (daughter Adah Acron) Choice offered to / list presented to : Adult Children      Discharge Placement              Patient chooses bed at:  ([facility]) Patient to be transferred to facility by: PTAR Name of family member notified: daughter Adah Acron in room Patient and family notified of of transfer: 07/29/23  Discharge Plan and Services Additional resources added to the After Visit Summary for   In-house Referral: Clinical Social Work   Post Acute Care Choice:  (TBD)                               Social Drivers of Health (SDOH) Interventions SDOH Screenings   Food Insecurity: No Food Insecurity (06/29/2023)  Housing: Low Risk  (06/29/2023)  Transportation Needs: No Transportation Needs (06/29/2023)  Utilities: Not At Risk (06/29/2023)  Alcohol Screen: Low Risk  (04/28/2019)  Depression (PHQ2-9): Medium Risk (06/15/2023)  Financial Resource Strain: Low Risk  (02/03/2021)  Physical Activity: Inactive (04/28/2019)  Social Connections: Moderately Isolated (06/29/2023)  Stress: No Stress Concern Present (04/28/2019)  Tobacco Use: Low Risk  (07/21/2023)     Readmission Risk Interventions     No data to display

## 2023-07-29 NOTE — Progress Notes (Signed)
 Mobility Specialist Progress Note:    07/29/23 1100  Mobility  Activity Transferred from bed to chair  Level of Assistance Minimal assist, patient does 75% or more  Assistive Device Front wheel walker  Distance Ambulated (ft) 4 ft  Activity Response Tolerated well  Mobility Referral Yes  Mobility visit 1 Mobility  Mobility Specialist Start Time (ACUTE ONLY) 1027  Mobility Specialist Stop Time (ACUTE ONLY) 1041  Mobility Specialist Time Calculation (min) (ACUTE ONLY) 14 min   Pt received in bed and agreeable. Required minA to come EOB and stand. Asymptomatic w/ no complaints throughout. Step pivoted to chair. Pt left in chair with call bell and all needs met. Chair alarm on and family present.  D'Vante Nolon Baxter Mobility Specialist Please contact via Special educational needs teacher or Rehab office at [phone]

## 2023-07-29 NOTE — Discharge Summary (Signed)
 Physician Discharge Summary   Patient: Darlene [name] MRN: [id number] DOB: 11/23/1940  Admit date:     06/29/2023  Discharge date: 07/29/23  Discharge Physician: Charlean Congress  PCP: Jenelle Mis, FNP  Recommendations at discharge: Follow-up with PCP in 1 week with a CBC and BMP. Follow-up with cardiology as recommended. Follow-up with neurosurgery as recommended. Continue nocturnal tube feed.  Nocturnal Tube Feeds via G-tube: Osmolite 1.5 at 60 mL/hr x 12 hours from 1800 to 0600 (720 mL/hr) Free water  flush: 100 mL q4h  Regimen provides 1080 kcal (meets 64% of estimated needs), 45 gm protein (meets 53% of estimated needs), 1149 mL total free water  daily. Diet: Dysphagia 1 with thin liquids Supplements: Ensure Plus High Protein BID   Follow-up Information     Adalberto Acton, MD Follow up on 08/12/2023.   Specialty: General Surgery Why: 08/12/2023  9:10 AM. Please arrive 30 minutes prior to your appointment. Please bring a copy of your photo ID and insurance card. Contact information: [address] [place] [address] [phone]         Jenelle Mis, FNP. Schedule an appointment as soon as possible for a visit in 1 week(s).   Specialty: Family Medicine Why: with BMP lab to look at kidney and electrolytes, with CBC lab to look at blood counts Contact information: [address] [place] [address] [phone]         Laurann Pollock, MD. Schedule an appointment as soon as possible for a visit in 2 week(s).   Specialty: Cardiology Contact information: [address] [place] [address] [phone]                Discharge Diagnoses: Principal Problem:   Acute GI bleeding Active Problems:   Intractable nausea and vomiting   Acute respiratory failure with hypoxia (HCC)   Aspiration into airway   Hiatal hernia   Acute pulmonary edema [facility])  Hospital Course: Darlene [name] is a 83 y.o. F with PMH significant for  atrial fibrillation on eliquis , OSA on CPAP, HTN, HLD, GERD and esophageal strictures.  She presents to our facility with ongoing coffee-ground emesis on 06/29/2023.  CT abdomen pelvis with contrast not revealing of any acute source of bleeding although noted high-grade stenosis of the celiac axis and collateral flow from the superior mesenteric artery-patient previously scheduled for hiatal hernia and spigelian hernia repair with Dr. Jamse Mcgee later this month.  Initially admitted for EGD planned for 06/30/2023, overnight noted to transition to A-fib with RVR with profound hypoxia during the event with noted pulmonary edema and large volume aspiration -initially placed on BiPAP with worsening respiratory status ultimately intubated for airway protection.  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  Ultimately extubated 07/04/23.  ICU delirium improving over the past week, respiratory status improving, echo showing moderate to severe right ventricular dysfunction.  Patient required transient pressors in the interim likely in setting of sepsis due to large volume aspiration pneumonia.  Patient weaned to nasal cannula, off pressors now transition to p.o. midodrine  and otherwise stable for transfer to hospital floor and out of the ICU.   Hospital course 4/15 admit with coffee ground emesis for inpatient EGD 4/16 afib with RVR, concern for pulm edema, PCCM consult and ICU txfr 4/17: ETT air leak, fixed with more balloon inflation. On Precedex  and Fentanyl  drips for sedation. Upper endoscopy-->Cameron ulcers on hiatal hernia.  A-fib rate  controlled on amiodarone . 4/21 Complaints of epigastric pain vs delirium overnight, still on amio gtt/heparin . Tube feeds paused this early this morning.   4/22 Delirium improving, patient had BM, Pulmonary Edema vs BL infiltrates on CXR-, CVP 18 -diuresed with lasix  and metolazone  4/24 Diuresed 3.5 L with metolazone /Lasix  4/26 Placed  on BiPAP for increased WOB 4/27 Point-of-care echo showed moderate to severe RV dysfunction.  LV function appears normal.  There is consolidation at both bases with a trivial effusion unsuitable for thoracentesis.  Started on norepinephrine  to support blood pressure as patient required Precedex  for delirium, which exacerbated hypotension 4/28 thoracentesis. Right 100 ml. Exudate by lights. Likely parapneumonic  4/29 off norepi PCXR improved. WBC nml pct neg stopped abx. Feeling better  4/30 weaned down to 4L [place] - transition to [facility]     Assessment & Plan: Acute hypoxic and hypercapnic respiratory failure Aspiration pneumonia in setting of large hiatal hernia Complicated by volume overload/parapneumonic pleural effusion Completed antibiotic course for aspiration pneumonia coverage Seems to have been weaned off of oxygen.  Saturations are in the mid 90s on room air. Echocardiogram from April showed LVEF of 50 to 55% with LVH.  Diastolic function could not be assessed. Will continue low-dose Lasix    Acute versus subacute GI bleed Hiatal hernia with oozing [name] erosions Seen by gastroenterology.  Underwent EGD 4/17 confirms Cameron ulcers. General Surgery was subsequently consulted. Patient underwent repair of the incarcerated paraesophageal hernia.  Also underwent gastrostomy tube placement and reduction of bowel from the incarcerated right lower quadrant hernia. Started on tube feedings by general surgery.  Stable for the most part.  No further episodes of vomiting.  General surgery continues to follow. Foley catheter was removed on 5/11.  Has been able to void urine. Remains on a dysphagia 1 diet along with tube feedings.   Hypotension, improving Questionably iatrogenic versus septic shock given above Orthostatic hypotension ongoing Required vasopressors in the ICU. Currently on midodrine .  Continue to monitor.  Blood pressure is stable.   Chronic A-fib with RVR On Eliquis  at  baseline.  Was on IV heparin . Started on amiodarone  in ICU. Required Cardizem  infusion in the OR on 5/7.  Patient remains on amiodarone  and metoprolol . Heart rate tends to be high mostly in the early morning hours but gets better with medications. Transitioned back to Eliquis .  Hemoglobin has been stable.   Hypokalemia Continue to supplement potassium and magnesium  as indicated.   Severe deconditioning Ambulatory dysfunction PT and OT following.  SNF is recommended for short-term rehab.   Hyperglycemia without history of diabetes Continue sliding scale insulin , hypoglycemic protocol A1c previously within normal limits 5.6   Macrocytic anemia Hemoglobin has been stable. Anemia panel shows ferritin of 240, iron of 53, TIBC 328, percent saturation 16.  B12 level 673.  Folic acid  8.3  Obesity Class 1 Body mass index is 30.84 kg/m.  Placing the pt at higher risk of poor outcomes.   Pain control - [place]  Controlled Substance Reporting System database was reviewed. and patient was instructed, not to drive, operate heavy machinery, perform activities at heights, swimming or participation in water  activities or provide baby-sitting services while on Pain, Sleep and Anxiety Medications; until their outpatient Physician has advised to do so again. Also recommended to not to take more than prescribed Pain, Sleep and Anxiety Medications.  Consultants:  General Surgery GI Cardiology ICU  Procedures performed:  EGD  Robotic repair of Type III incarcerated paraesophageal hernia, gastropexy, gastrostomy tube placement, reduction of  bowel from incarcerated right lower quadrant hernia, lysis of adhesions x45min   PEG tube placement. PICC line placement.  DISCHARGE MEDICATION: Allergies as of 07/29/2023       Reactions   Cephalosporins Rash   Tolerated ceftriaxone  06/2023   Keflex [cephalexin] Rash        Medication List     STOP taking these medications    cyclobenzaprine   10 MG tablet Commonly known as: FLEXERIL    diltiazem  300 MG 24 hr capsule Commonly known as: CARDIZEM  CD   docusate sodium  100 MG capsule Commonly known as: COLACE Replaced by: docusate 50 MG/5ML liquid   famotidine  40 MG tablet Commonly known as: PEPCID    Magnesium  250 MG Tabs   metoprolol  tartrate 25 MG tablet Commonly known as: LOPRESSOR    sucralfate  1 g tablet Commonly known as: CARAFATE    zinc gluconate 50 MG tablet       TAKE these medications    acetaminophen  500 MG tablet Commonly known as: TYLENOL  Take 500 mg by mouth every 6 (six) hours as needed for mild pain (pain score 1-3).   albuterol  108 (90 Base) MCG/ACT inhaler Commonly known as: VENTOLIN  HFA Inhale 2 puffs into the lungs every 6 (six) hours as needed for wheezing or shortness of breath.   amiodarone  200 MG tablet Commonly known as: PACERONE  Take 1 tablet (200 mg total) by mouth daily. Start taking on: Jul 30, 2023   bisacodyl  10 MG suppository Commonly known as: DULCOLAX Place 1 suppository (10 mg total) rectally daily as needed for moderate constipation.   busPIRone  5 MG tablet Commonly known as: BUSPAR  Take 1 tablet (5 mg total) by mouth 3 (three) times daily. What changed:  medication strength See the new instructions.   CHOLECALCIFEROL PO Take 5,000 Units by mouth daily.   Cyanocobalamin  2500 MCG Tabs Take 2,500 mcg by mouth once a week.   docusate 50 MG/5ML liquid Commonly known as: COLACE Take 10 mLs (100 mg total) by mouth 2 (two) times daily. Replaces: docusate sodium  100 MG capsule   Eliquis  5 MG Tabs tablet Generic drug: apixaban  TAKE 1 TABLET BY MOUTH TWICE  DAILY   escitalopram  20 MG tablet Commonly known as: LEXAPRO  TAKE 1 TABLET BY MOUTH DAILY FOR MOOD, ANXIETY &amp; IRRITABILITY   feeding supplement Liqd Take 237 mLs by mouth in the morning and at bedtime.   feeding supplement (OSMOLITE 1.5 CAL) Liqd Place 720 mLs into feeding tube daily. Nocturnal Tube  Feeds: Osmolite 1.5 at 60 mL/hr x 12 hrs from 1800 to 0600   ferrous sulfate  325 (65 FE) MG tablet Take 1 tablet (325 mg total) by mouth every other day.   free water  Soln Place 100 mLs into feeding tube every 4 (four) hours.   furosemide  20 MG tablet Commonly known as: Lasix  Take 1 tablet (20 mg total) by mouth daily.   insulin  aspart 100 UNIT/ML injection Commonly known as: novoLOG  Inject 0-20 Units into the skin every 4 (four) hours.   lidocaine  5 % Commonly known as: LIDODERM  Place 1 patch onto the skin daily. Remove & Discard patch within 12 hours or as directed by MD Start taking on: Jul 30, 2023   metoCLOPramide  10 MG tablet Commonly known as: REGLAN  Take 1 tablet (10 mg total) by mouth 4 (four) times daily -  before meals and at bedtime.   metoprolol  succinate 25 MG 24 hr tablet Commonly known as: Toprol  XL Take 0.5 tablets (12.5 mg total) by mouth daily.   midodrine  10  MG tablet Commonly known as: PROAMATINE  Take 1 tablet (10 mg total) by mouth in the morning, at noon, and at bedtime.   nitroGLYCERIN  0.4 MG SL tablet Commonly known as: NITROSTAT  Place 1 tablet (0.4 mg total) under the tongue every 5 (five) minutes as needed for chest pain.   pantoprazole  40 MG tablet Commonly known as: Protonix  Take 1 tablet (40 mg total) by mouth 2 (two) times daily. Take  1 tablet  Daily  to prevent  Indigestion & Acid Reflux   polyethylene glycol 17 g packet Commonly known as: MIRALAX  / GLYCOLAX  Take 17 g by mouth daily. Start taking on: Jul 30, 2023   PROBIOTIC DAILY PO Take 1 tablet by mouth daily.   traMADol  50 MG tablet Commonly known as: ULTRAM  Take 1 tablet (50 mg total) by mouth every 6 (six) hours as needed for severe pain (pain score 7-10).   Vitamin C 500 MG Caps Take 500 mg by mouth.       Disposition: SNF Diet recommendation: Dysphagia type 1 thin Liquid  Discharge Exam: Vitals:   07/28/23 1959 07/28/23 2110 07/29/23 0500 07/29/23 0715  BP: (!)  93/40 (!) 107/47 (!) 101/39 (!) 108/48  Pulse:  96 87 75  Resp:    16  Temp:   98.5 F (36.9 C) 97.9 F (36.6 C)  TempSrc:   Oral   SpO2:    97%  Weight:      Height:       General: Appear in mild distress; no visible Abnormal Neck Mass Or lumps, Conjunctiva normal Cardiovascular: S1 and S2 Present, no Murmur, Respiratory: good respiratory effort, Bilateral Air entry present and CTA, no Crackles, no wheezes Abdomen: Bowel Sound present, Non tender  Extremities: trace Pedal edema Neurology: alert and oriented to time, place, and person  Filed Weights   07/05/23 0522 07/11/23 0500 07/19/23 1547  Weight: 98 kg 96.5 kg 97.5 kg   Condition at discharge: stable  The results of significant diagnostics from this hospitalization (including imaging, microbiology, ancillary and laboratory) are listed below for reference.   Imaging Studies: DG Chest Port 1 View Result Date: 07/13/2023 CLINICAL DATA:  Status post right thoracentesis EXAM: PORTABLE CHEST 1 VIEW COMPARISON:  Film from the previous day. FINDINGS: Cardiac shadow is stable. Stable large hiatal hernia is noted. Feeding catheter extends into the stomach. Right PICC is again seen and stable. Right basilar atelectasis is noted stable from the prior study. Some patchy airspace opacities are again noted bilaterally. IMPRESSION: No significant recurrent pleural effusion is noted. Stable airspace opacities bilaterally. Electronically Signed   By: Violeta Grey M.D.   On: 07/13/2023 08:06   DG Chest Port 1 View Result Date: 07/12/2023 CLINICAL DATA:  Status post thoracentesis EXAM: PORTABLE CHEST 1 VIEW COMPARISON:  07/09/2023 FINDINGS: Feeding tube extends through a large hiatal hernia 2 below the margin the film. PICC line unchanged. Interval reduction in RIGHT pleural fluid following thoracentesis. No pneumothorax. Patchy bilateral airspace disease is unchanged. IMPRESSION: 1. No pneumothorax following RIGHT thoracentesis. Reduction in pleural  fluid. 2. Stable support apparatus. Electronically Signed   By: Deboraha Fallow M.D.   On: 07/12/2023 18:01   DG Chest Port 1 View Result Date: 07/10/2023 CLINICAL DATA:  Status post PICC placement. EXAM: PORTABLE CHEST 1 VIEW COMPARISON:  07/09/2023 chest CTA dated 06/30/2023. FINDINGS: Enlarged cardiac silhouette with an interval increase in size. No significant change in bilateral patchy and confluent densities, most pronounced in the lower lobes. No significant change in  small bilateral pleural effusions. Interval right PICC with its tip 2 cm inferior to the superior cavoatrial junction. Feeding tube extending into the stomach, with its tip not included. No acute bony abnormality. IMPRESSION: 1. Interval right PICC with its tip 2 cm inferior to the superior cavoatrial junction. 2. No significant change in bilateral patchy and confluent densities, most pronounced in the lower lobes, most compatible with pneumonia and atelectasis. 3. No significant change in small bilateral pleural effusions. 4. Cardiomegaly with an interval increase in size. Electronically Signed   By: Catherin Closs M.D.   On: 07/10/2023 17:37   US  EKG SITE RITE Result Date: 07/10/2023 If Site Rite image not attached, placement could not be confirmed due to current cardiac rhythm.  DG CHEST PORT 1 VIEW Result Date: 07/09/2023 CLINICAL DATA:  Acute respiratory failure. EXAM: PORTABLE CHEST 1 VIEW COMPARISON:  July 07, 2023. FINDINGS: Stable cardiomegaly. Feeding tube is seen entering stomach. Left internal jugular catheter is unchanged. Stable bilateral opacities are noted concerning for edema or pneumonia. Bilateral pleural effusions are also noted, right greater than left. Bony thorax is unremarkable. IMPRESSION: Stable support apparatus.  Stable lung opacities. Electronically Signed   By: Rosalene Colon M.D.   On: 07/09/2023 08:49   DG Chest Port 1 View Result Date: 07/07/2023 CLINICAL DATA:  Acute respiratory failure. EXAM:  PORTABLE CHEST 1 VIEW COMPARISON:  07/04/2023 FINDINGS: There is a left IJ catheter with tip at the superior cavoatrial junction. Feeding tube is identified which courses below the level of the GE junction. Stable cardiomediastinal contours. Unchanged bilateral pleural effusions, right greater than left. Diffuse interstitial opacities and patchy airspace densities have increased from the previous exam. Visualized osseous structures appear intact. IMPRESSION: 1. Interval increase in diffuse interstitial opacities and patchy airspace densities compatible with worsening pulmonary edema. 2. Unchanged bilateral pleural effusions, right greater than left. Electronically Signed   By: Kimberley Penman M.D.   On: 07/07/2023 06:58   DG Abd 1 View Result Date: 07/05/2023 CLINICAL DATA:  Epigastric pain EXAM: ABDOMEN - 1 VIEW COMPARISON:  July 02, 2022 FINDINGS: Weighted feeding tube coils in the right abdomen and terminates in the right mid to lower abdomen. Scattered nondistended segments of bowel in the abdomen. No definite pneumoperitoneum on this supine radiograph. Layering bilateral pleural effusions with bibasilar atelectasis. Cardiomegaly. Multilevel degenerative disc disease of the spine. IMPRESSION: Weighted feeding tube coils in the right abdomen and terminates in the right mid to lower abdomen. This does not follow the expected course of the upper GI tract. Repeat KUB after the installation of enteric contrast via the weighted feeding tube should be considered. Alternatively, a CT of the abdomen and pelvis with IV contrast could be used for definitive characterization of tube position. Electronically Signed   By: Rance Burrows M.D.   On: 07/05/2023 12:46   DG Chest Port 1 View Result Date: 07/04/2023 CLINICAL DATA:  Evaluate pulmonary edema EXAM: PORTABLE CHEST 1 VIEW COMPARISON:  06/30/2023 FINDINGS: Left IJ catheter tip is in the projection of the distal SVC. There is a feeding tube which courses below the  level of the GE junction. ETT tip is stable above the carina. Unchanged cardiomediastinal contours. Bilateral lower lobe pulmonary opacities are unchanged in the interval. No new findings. IMPRESSION: 1. Stable support apparatus. 2. Unchanged bilateral lower lobe pulmonary opacities. Electronically Signed   By: Kimberley Penman M.D.   On: 07/04/2023 11:31   DG Abd Portable 1V Result Date: 07/02/2023 CLINICAL DATA:  [id number] Encounter for feeding tube placement [id number] EXAM: PORTABLE ABDOMEN - 1 VIEW COMPARISON:  June 28, 2023 FINDINGS: Weighted feeding tube terminates in the region of the stomach. Gaseous distension of the partially visualized colon. Patchy airspace opacities in the left lung base, may represent atelectasis or pneumonia. Cardiomegaly. Multilevel degenerative disc disease of the spine. IMPRESSION: Weighted feeding tube terminates in the region of the distal stomach. Electronically Signed   By: Rance Burrows M.D.   On: 07/02/2023 13:19   CT Angio Chest Pulmonary Embolism (PE) W or WO Contrast Result Date: 06/30/2023 CLINICAL DATA:  Pulmonary embolism (PE) suspected, high prob. Proxy a. EXAM: CT ANGIOGRAPHY CHEST WITH CONTRAST TECHNIQUE: Multidetector CT imaging of the chest was performed using the standard protocol during bolus administration of intravenous contrast. Multiplanar CT image reconstructions and MIPs were obtained to evaluate the vascular anatomy. RADIATION DOSE REDUCTION: This exam was performed according to the departmental dose-optimization program which includes automated exposure control, adjustment of the mA and/or kV according to patient size and/or use of iterative reconstruction technique. CONTRAST:  65mL OMNIPAQUE  IOHEXOL  350 MG/ML SOLN COMPARISON:  None Available. FINDINGS: Cardiovascular: Mild cardiomegaly. Aorta normal caliber. No filling defects in the pulmonary arteries to suggest pulmonary emboli. Mediastinum/Nodes: No mediastinal, hilar, or axillary adenopathy. Trachea  and esophagus are unremarkable. Thyroid  unremarkable. Endotracheal tube tip in the midtrachea. Fluid-filled esophagus. NG tube is in place with the tip in the distal esophagus. Large hiatal hernia with much of the stomach in the right lower chest. Lungs/Pleura: Dense consolidation in both lower lobes. Small bilateral pleural effusions. Patchy ground-glass opacities in the upper lobes. Upper Abdomen: No acute findings Musculoskeletal: Chest wall soft tissues are unremarkable. No acute bony abnormality. Review of the MIP images confirms the above findings. IMPRESSION: No evidence of pulmonary embolus. Cardiomegaly. Dense consolidation in both lower lobes with patchy ground-glass opacities in the upper lobes. Findings concerning for pneumonia. Small bilateral pleural effusions. Large hiatal hernia. NG tube tip in the fluid-filled distal esophagus. Electronically Signed   By: Janeece Mechanic M.D.   On: 06/30/2023 21:05   Portable Chest x-ray Result Date: 06/30/2023 CLINICAL DATA:  ET tube placement EXAM: PORTABLE CHEST 1 VIEW COMPARISON:  X-ray earlier 06/30/2023 FINDINGS: New ET tube in place with tip seen 4.7 cm above the carina. Left IJ line in place with tip along the central SVC. Enteric tube in place with tip overlying the mid mediastinum. This would need to be advanced further several cm to reach the stomach. Enlarged cardiopericardial silhouette. Hyperinflation. Extensive parenchymal opacities along the mid lower lung zones with small effusions. Underlying diffuse interstitial changes. Apical pleural thickening. No pneumothorax. Overlapping cardiac leads. IMPRESSION: New tubes and lines. New ET tube and left IJ catheter. No pneumothorax. Enteric tube with tip overlying the mid mediastinum. This could be advanced further several cm to reach the stomach. Hyperinflation with enlarged heart. Persistent parenchymal opacities throughout the mid and lower lung zones. Small effusions. Recommend follow-up Electronically  Signed   By: Adrianna Horde M.D.   On: 06/30/2023 18:53   ECHOCARDIOGRAM COMPLETE Result Date: 06/30/2023    ECHOCARDIOGRAM REPORT   Patient Name:   KARIZMA RADI Date of Exam: 06/30/2023 Medical Rec #:  [id number]      Height:       70.0 in Accession #:    [id number]     Weight:       211.0 lb Date of Birth:  [birth year]     BSA:  2.135 m Patient Age:    82 years       BP:           103/64 mmHg Patient Gender: F              HR:           90 bpm. Exam Location:  Inpatient Procedure: 2D Echo, Color Doppler and Cardiac Doppler (Both Spectral and Color            Flow Doppler were utilized during procedure). STAT ECHO Indications:    Shock R57.9  History:        Patient has prior history of Echocardiogram examinations, most                 recent 04/16/2019. Pulmonary HTN, Arrythmias:Atrial Fibrillation;                 Risk Factors:Hypertension and Dyslipidemia.  Sonographer:    Sherline Distel Senior RDCS Referring Phys: [id number] Josiah Nigh  Sonographer Comments: Scanned post emergent intubation IMPRESSIONS  1. Left ventricular ejection fraction, by estimation, is 50 to 55%. The left ventricle has low normal function. The left ventricle demonstrates global hypokinesis. There is mild concentric left ventricular hypertrophy. Left ventricular diastolic function could not be evaluated.  2. Akiniesis of the RV free wall with tethering of the apex consistent with McConnell's sign and acute RV dsyfunction. Right ventricular systolic function is mildly reduced. The right ventricular size is mildly enlarged. There is moderately elevated pulmonary artery systolic pressure.  3. Left atrial size was moderately dilated.  4. A small pericardial effusion is present.  5. The mitral valve is normal in structure. Trivial mitral valve regurgitation.  6. Tricuspid valve regurgitation is moderate.  7. The aortic valve is tricuspid. Aortic valve regurgitation is trivial. Aortic valve sclerosis is present, with no evidence of aortic valve  stenosis.  8. The inferior vena cava is dilated in size with <50% respiratory variability, suggesting right atrial pressure of 15 mmHg. FINDINGS  Left Ventricle: Left ventricular ejection fraction, by estimation, is 50 to 55%. The left ventricle has low normal function. The left ventricle demonstrates global hypokinesis. The left ventricular internal cavity size was normal in size. There is mild concentric left ventricular hypertrophy. Left ventricular diastolic function could not be evaluated due to atrial fibrillation. Left ventricular diastolic function could not be evaluated. Right Ventricle: Akiniesis of the RV free wall with tethering of the apex consistent with McConnell's sign and acute RV dsyfunction. The right ventricular size is mildly enlarged. No increase in right ventricular wall thickness. Right ventricular systolic function is mildly reduced. There is moderately elevated pulmonary artery systolic pressure. The tricuspid regurgitant velocity is 2.81 m/s, and with an assumed right atrial pressure of 15 mmHg, the estimated right ventricular systolic pressure is 46.6 mmHg. Left Atrium: Left atrial size was moderately dilated. Right Atrium: Right atrial size was normal in size. Pericardium: A small pericardial effusion is present. Mitral Valve: The mitral valve is normal in structure. Trivial mitral valve regurgitation. Tricuspid Valve: The tricuspid valve is normal in structure. Tricuspid valve regurgitation is moderate. Aortic Valve: The aortic valve is tricuspid. Aortic valve regurgitation is trivial. Aortic valve sclerosis is present, with no evidence of aortic valve stenosis. Pulmonic Valve: The pulmonic valve was normal in structure. Pulmonic valve regurgitation is mild. Aorta: The aortic root and ascending aorta are structurally normal, with no evidence of dilitation. Venous: The inferior vena cava is dilated in size with less than  50% respiratory variability, suggesting right atrial pressure of 15  mmHg. IAS/Shunts: No atrial level shunt detected by color flow Doppler.  LEFT VENTRICLE PLAX 2D LVIDd:         3.60 cm LVIDs:         2.70 cm LV PW:         1.30 cm LV IVS:        1.00 cm LVOT diam:     1.90 cm LV SV:         34 LV SV Index:   16 LVOT Area:     2.84 cm  RIGHT VENTRICLE RV S prime:     7.72 cm/s TAPSE (M-mode): 1.6 cm LEFT ATRIUM              Index        RIGHT ATRIUM           Index LA diam:        4.50 cm  2.11 cm/m   RA Area:     19.40 cm LA Vol (A2C):   64.3 ml  30.11 ml/m  RA Volume:   52.40 ml  24.54 ml/m LA Vol (A4C):   110.0 ml 51.52 ml/m LA Biplane Vol: 85.0 ml  39.81 ml/m  AORTIC VALVE LVOT Vmax:   78.25 cm/s LVOT Vmean:  52.000 cm/s LVOT VTI:    0.120 m  AORTA Ao Root diam: 2.50 cm Ao Asc diam:  2.80 cm TRICUSPID VALVE TR Peak grad:   31.6 mmHg TR Vmax:        281.00 cm/s  SHUNTS Systemic VTI:  0.12 m Systemic Diam: 1.90 cm Arta Lark Electronically signed by Arta Lark Signature Date/Time: 06/30/2023/3:04:09 PM    Final    DG Chest 1 View Result Date: 06/30/2023 CLINICAL DATA:  Hypoxia. EXAM: CHEST  1 VIEW COMPARISON:  06/28/2023. FINDINGS: Stable cardiomediastinal silhouette. Redemonstrated large hiatal hernia at the right base with adjacent atelectasis. Streaky opacities at the left lung base. No pneumothorax. Visualized osseous structures are unchanged. IMPRESSION: 1. Similar large hiatal hernia with adjacent atelectasis in the right lower lung. 2. Streaky opacities at the left lung base could reflect atelectasis or infiltrate. Electronically Signed   By: Mannie Seek M.D.   On: 06/30/2023 12:57    Microbiology: Results for orders placed or performed during the hospital encounter of 06/29/23  Culture, Respiratory w Gram Stain     Status: None   Collection Time: 06/30/23  2:16 PM   Specimen: Bronchoalveolar Lavage; Respiratory  Result Value Ref Range Status   Specimen Description BRONCHIAL ALVEOLAR LAVAGE  Final   Special Requests RLL  Final   Gram  Stain RARE WBC PRESENT, PREDOMINANTLY PMN RARE YEAST   Final   Culture   Final    Normal respiratory flora-no Staph aureus or Pseudomonas seen Performed at [facility], [address] [place], [place] [address]    Report Status 07/02/2023 FINAL  Final  MRSA Next Gen by PCR, Nasal     Status: None   Collection Time: 06/30/23  4:17 PM   Specimen: Nasal Mucosa; Nasal Swab  Result Value Ref Range Status   MRSA by PCR Next Gen NOT DETECTED NOT DETECTED Final    Comment: (NOTE) The GeneXpert MRSA Assay (FDA approved for NASAL specimens only), is one component of a comprehensive MRSA colonization surveillance program. It is not intended to diagnose MRSA infection nor to guide or monitor treatment for MRSA infections. Test performance is not FDA  approved in patients less than 48 years old. Performed at [facility], [address], [place], [place] [address]   Body fluid culture w Gram Stain     Status: None   Collection Time: 07/12/23  4:50 PM   Specimen: Pleura; Body Fluid  Result Value Ref Range Status   Specimen Description PLEURAL  Final   Special Requests RIGHT LUNG  Final   Gram Stain   Final    RARE WBC PRESENT, PREDOMINANTLY MONONUCLEAR NO ORGANISMS SEEN    Culture   Final    NO GROWTH 3 DAYS Performed at [facility], [address], [place], [place] [address]    Report Status 07/16/2023 FINAL  Final   Labs: CBC: Recent Labs  Lab 07/24/23 0445 07/25/23 0400 07/26/23 0255 07/28/23 0314 07/29/23 0445  WBC 8.6 7.5 10.3 7.8 8.1  HGB 10.2* 10.7* 10.8* 10.2* 10.3*  HCT 31.9* 33.8* 34.2* 32.4* 32.1*  MCV 100.6* 101.5* 101.8* 102.2* 100.0  PLT 308 304 283 250 241   Basic Metabolic Panel: Recent Labs  Lab 07/23/23 0101 07/25/23 0400 07/28/23 0314 07/29/23 0445  NA 134* 138 136 136  K 3.7 3.9 4.1 4.1  CL 95* 99 101 101  CO2 28 31 29 27   GLUCOSE 140* 130* 157* 151*  BUN 27* 17 20 20   CREATININE 0.75 0.57 0.65 0.61  CALCIUM 8.8* 9.2 9.0 9.2   MG 2.0 1.9 1.9  --    Liver Function Tests: Recent Labs  Lab 07/28/23 0314  AST 56*  ALT 99*  ALKPHOS 103  BILITOT 0.4  PROT 5.2*  ALBUMIN  2.3*   CBG: Recent Labs  Lab 07/28/23 1953 07/29/23 0015 07/29/23 0428 07/29/23 0712 07/29/23 1131  GLUCAP 131* 147* 126* 159* 159*    Discharge time spent: greater than 30 minutes.  Author: Charlean Congress, MD  [facility]

## 2023-08-03 ENCOUNTER — Other Ambulatory Visit

## 2023-08-07 LAB — LAB REPORT - SCANNED
A1c: 5.3
EGFR: 75

## 2023-08-10 ENCOUNTER — Ambulatory Visit: Admitting: Family Medicine

## 2023-08-15 DIAGNOSIS — J189 Pneumonia, unspecified organism: Secondary | ICD-10-CM

## 2023-08-15 HISTORY — DX: Pneumonia, unspecified organism: J18.9

## 2023-08-17 ENCOUNTER — Encounter: Payer: Self-pay | Admitting: Family Medicine

## 2023-08-17 ENCOUNTER — Ambulatory Visit: Admitting: Family Medicine

## 2023-08-17 ENCOUNTER — Ambulatory Visit: Payer: Medicare Other | Admitting: Nurse Practitioner

## 2023-08-17 VITALS — BP 134/84 | HR 83 | Temp 98.5°F | Ht 70.0 in | Wt 213.0 lb

## 2023-08-17 DIAGNOSIS — F419 Anxiety disorder, unspecified: Secondary | ICD-10-CM

## 2023-08-17 DIAGNOSIS — J690 Pneumonitis due to inhalation of food and vomit: Secondary | ICD-10-CM

## 2023-08-17 DIAGNOSIS — K922 Gastrointestinal hemorrhage, unspecified: Secondary | ICD-10-CM

## 2023-08-17 DIAGNOSIS — K9423 Gastrostomy malfunction: Secondary | ICD-10-CM

## 2023-08-17 DIAGNOSIS — K9429 Other complications of gastrostomy: Secondary | ICD-10-CM | POA: Diagnosis not present

## 2023-08-17 DIAGNOSIS — J9601 Acute respiratory failure with hypoxia: Secondary | ICD-10-CM

## 2023-08-17 DIAGNOSIS — D509 Iron deficiency anemia, unspecified: Secondary | ICD-10-CM

## 2023-08-17 DIAGNOSIS — I4891 Unspecified atrial fibrillation: Secondary | ICD-10-CM

## 2023-08-17 MED ORDER — DILTIAZEM HCL ER COATED BEADS 180 MG PO CP24: 180.0000 mg | ORAL_CAPSULE | Freq: Every day | ORAL | 1 refills | Status: DC

## 2023-08-17 NOTE — Progress Notes (Addendum)
 Patient Office Visit  Assessment & Plan:  Acute respiratory failure with hypoxia (HCC)  Irritation around percutaneous endoscopic gastrostomy (PEG) tube site [employer]) -     Ambulatory referral to General Surgery  Atrial fibrillation with RVR (HCC) -     [id number]  HCl ER Coated Beads; Take 1 capsule (180 mg total) by mouth daily.  Dispense: 90 capsule; Refill: 1  Acute GI bleeding  Iron deficiency anemia, unspecified iron deficiency anemia type  Anxiety  Aspiration pneumonia, unspecified aspiration pneumonia type, unspecified laterality, unspecified part of lung (HCC)  PEG tube malfunction (HCC) -     Ambulatory referral to General Surgery   Assessment and Plan    Gastroesophageal Reflux Disease (GERD) with Hiatal Hernia and Ulcers GERD complicated by hiatal hernia and gastric ulcers, causing gastrointestinal bleeding and aspiration pneumonia. Protonix  effective for GERD and ulcer healing. - Continue Protonix  for GERD management. - Follow up with GI specialist for hiatal hernia and ulcers evaluation.  PEG Tube Complications Potential infection at PEG tube site with foul smell and discharge. Site loosening increases infection risk. - Urgent referral to Dr. Aldon Hung for evaluation and potential PEG tube removal.  Atrial Fibrillation Atrial fibrillation previously managed with Eliquis . Pradaxa used during hospitalization but has higher bleeding risk. Eliquis  preferred for better tolerance and lower bleeding risk. Continued anticoagulation necessary to reduce stroke risk. - Discontinue Pradaxa. - Restart Eliquis , 5 mg twice daily. - Follow up with cardiologist, Dr. Amanda Jungling, for atrial fibrillation management.  Anemia Anemia managed with iron supplementation. Hemoglobin improved to 11.9 g/dL, likely due to recent gastrointestinal bleeding and ulcers. - Continue iron supplementation as needed.  Anxiety Anxiety managed with Lexapro  and BuSpar , with symptom improvement. -  Continue Lexapro  daily. - Use BuSpar  as needed for anxiety.  General Health Maintenance Normal kidney function, improved anemia status, A1c at 5.3, no insulin  needed. Slightly elevated B12 levels not concerning. - Continue B12 supplementation as prescribed. - Monitor blood glucose levels occasionally.  Follow-up Follow-up required with cardiologist and surgeon for ongoing issues. Urgent referral to Dr. Aldon Hung for PEG tube site concerns. - Schedule follow-up with cardiologist, Dr. Amanda Jungling, on July 22. - Contact Dr. Aldon Hung for earlier appointment regarding PEG tube removal.      Test results were reviewed and analyzed as part of the medical decision making of this visit. Reviewed [facility] hospital notes, previous labs and diagnostic studies during the office visit.  Also reviewed labs that were done at [facility] on May 24th- CBC improved and BMP improved- we decided not to repeat labs today per patient request.  Patient will stop Midrodine and restart Cardiazem CD at lower dosage - 180mg  once per day. Pt will resume Eliquis  for now (not Pradaxa).   No follow-ups on file.   Subjective:     Patient ID: Darlene [name], female    DOB: 09-03-1940  Age: 83 y.o. MRN: [id number]  Chief Complaint  Patient presents with   Hospitalization Follow-up    HPI Discussed the use of AI scribe software for clinical note transcription with the patient, who gave verbal consent to proceed. Patent is here for hospital follow up and Rehab follow up. Pt was discharged from [facility] on May 15th and then went to [facility] for 2 weeks (discharged on May 30th). Patient has been home since then and has been improving. Patient lives with her daughter.  Admit date:     06/29/2023  Discharge date: 07/29/23  Discharge Physician: Charlean Congress  PCP: Jenelle Mis, FNP   Recommendations at discharge: Follow-up with PCP in 1 week with a CBC and BMP. Follow-up with cardiology as  recommended. Follow-up with neurosurgery as recommended. Continue nocturnal tube feed.  Nocturnal Tube Feeds via G-tube: Osmolite 1.5 at 60 mL/hr x 12 hours from 1800 to 0600 (720 mL/hr) Free water  flush: 100 mL q4h  Regimen provides 1080 kcal (meets 64% of estimated needs), 45 gm protein (meets 53% of estimated needs), 1149 mL total free water  daily. Diet: Dysphagia 1 with thin liquids Supplements: Ensure Plus High Protein BID     Follow-up Information       Adalberto Acton, MD Follow up on 08/12/2023.   Specialty: General Surgery Why: 08/12/2023  9:10 AM. Please arrive 30 minutes prior to your appointment. Please bring a copy of your photo ID and insurance card. Contact information: [address] [place] [address] [phone]              Jenelle Mis, FNP. Schedule an appointment as soon as possible for a visit in 1 week(s).   Specialty: Family Medicine Why: with BMP lab to look at kidney and electrolytes, with CBC lab to look at blood counts Contact information: [address] [place] [address] [phone]              Laurann Pollock, MD. Schedule an appointment as soon as possible for a visit in 2 week(s).   Specialty: Cardiology Contact information: [address] [place] [address] [phone]                        Discharge Diagnoses: Principal Problem:   Acute GI bleeding Active Problems:   Intractable nausea and vomiting   Acute respiratory failure with hypoxia (HCC)   Aspiration into airway   Hiatal hernia   Acute pulmonary edema [facility])   Hospital Course: Darlene [name] is a 83 y.o. F with PMH significant for atrial fibrillation on eliquis , OSA on CPAP, HTN, HLD, GERD and esophageal strictures.  She presents to our facility with ongoing coffee-ground emesis on 06/29/2023.  CT abdomen pelvis with contrast not revealing of any acute source of bleeding although noted high-grade stenosis of the celiac axis and  collateral flow from the superior mesenteric artery-patient previously scheduled for hiatal hernia and spigelian hernia repair with Dr. Jamse Mcgee later this month.  Initially admitted for EGD planned for 06/30/2023, overnight noted to transition to A-fib with RVR with profound hypoxia during the event with noted pulmonary edema and large volume aspiration -initially placed on BiPAP with worsening respiratory status ultimately intubated for airway protection.  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  Ultimately extubated 07/04/23.  ICU delirium improving over the past week, respiratory status improving, echo showing moderate to severe right ventricular dysfunction.  Patient required transient pressors in the interim likely in setting of sepsis due to large volume aspiration pneumonia.  Patient weaned to nasal cannula, off pressors now transition to p.o. midodrine  and otherwise stable for transfer to hospital floor and out of the ICU.   Hospital course 4/15 admit with coffee ground emesis for inpatient EGD 4/16 afib with RVR, concern for pulm edema, PCCM consult and ICU txfr 4/17: ETT air leak, fixed with more balloon inflation. On Precedex  and Fentanyl  drips for sedation. Upper endoscopy-->Cameron ulcers on hiatal hernia.  A-fib rate controlled on amiodarone . 4/21 Complaints of  epigastric pain vs delirium overnight, still on amio gtt/heparin . Tube feeds paused this early this morning.   4/22 Delirium improving, patient had BM, Pulmonary Edema vs BL infiltrates on CXR-, CVP 18 -diuresed with lasix  and metolazone  4/24 Diuresed 3.5 L with metolazone /Lasix  4/26 Placed on BiPAP for increased WOB 4/27 Point-of-care echo showed moderate to severe RV dysfunction.  LV function appears normal.  There is consolidation at both bases with a trivial effusion unsuitable for thoracentesis.  Started on norepinephrine  to support blood pressure as patient required Precedex  for  delirium, which exacerbated hypotension 4/28 thoracentesis. Right 100 ml. Exudate by lights. Likely parapneumonic  4/29 off norepi PCXR improved. WBC nml pct neg stopped abx. Feeling better  4/30 weaned down to 4L [place] - transition to [facility]     Assessment & Plan: Acute hypoxic and hypercapnic respiratory failure Aspiration pneumonia in setting of large hiatal hernia Complicated by volume overload/parapneumonic pleural effusion Completed antibiotic course for aspiration pneumonia coverage Seems to have been weaned off of oxygen.  Saturations are in the mid 90s on room air. Echocardiogram from April showed LVEF of 50 to 55% with LVH.  Diastolic function could not be assessed. Will continue low-dose Lasix    Acute versus subacute GI bleed Hiatal hernia with oozing [name] erosions Seen by gastroenterology.  Underwent EGD 4/17 confirms Cameron ulcers. General Surgery was subsequently consulted. Patient underwent repair of the incarcerated paraesophageal hernia.  Also underwent gastrostomy tube placement and reduction of bowel from the incarcerated right lower quadrant hernia. Started on tube feedings by general surgery.  Stable for the most part.  No further episodes of vomiting.  General surgery continues to follow. Foley catheter was removed on 5/11.  Has been able to void urine. Remains on a dysphagia 1 diet along with tube feedings.   Hypotension, improving Questionably iatrogenic versus septic shock given above Orthostatic hypotension ongoing Required vasopressors in the ICU. Currently on midodrine .  Continue to monitor.  Blood pressure is stable.   Chronic A-fib with RVR On Eliquis  at baseline.  Was on IV heparin . Started on amiodarone  in ICU. Required Cardizem  infusion in the OR on 5/7.  Patient remains on amiodarone  and metoprolol . Heart rate tends to be high mostly in the early morning hours but gets better with medications. Transitioned back to Eliquis .  Hemoglobin has been  stable.   Hypokalemia Continue to supplement potassium and magnesium  as indicated.   Severe deconditioning Ambulatory dysfunction PT and OT following.  SNF is recommended for short-term rehab.   Hyperglycemia without history of diabetes Continue sliding scale insulin , hypoglycemic protocol A1c previously within normal limits 5.6   Macrocytic anemia Hemoglobin has been stable. Anemia panel shows ferritin of 240, iron of 53, TIBC 328, percent saturation 16.  B12 level 673.  Folic acid  8.3   Obesity Class 1 Body mass index is 30.84 kg/m.  Placing the pt at higher risk of poor outcomes.   Pain control - [place]  Controlled Substance Reporting System database was reviewed. and patient was instructed, not to drive, operate heavy machinery, perform activities at heights, swimming or participation in water  activities or provide baby-sitting services while on Pain, Sleep and Anxiety Medications; until their outpatient Physician has advised to do so again. Also recommended to not to take more than prescribed Pain, Sleep and Anxiety Medications.  Consultants:  General Surgery GI Cardiology ICU   Procedures performed:  EGD   Robotic repair of Type III incarcerated paraesophageal hernia, gastropexy, gastrostomy tube placement, reduction of bowel from incarcerated  right lower quadrant hernia, lysis of adhesions x71min    PEG tube placement. PICC line placement.   DISCHARGE MEDICATION: Allergies as of 07/29/2023         Reactions    Cephalosporins Rash    Tolerated ceftriaxone  06/2023    Keflex [cephalexin] Rash            Medication List       STOP taking these medications     cyclobenzaprine  10 MG tablet Commonly known as: FLEXERIL     diltiazem  300 MG 24 hr capsule Commonly known as: CARDIZEM  CD    docusate sodium  100 MG capsule Commonly known as: COLACE Replaced by: docusate 50 MG/5ML liquid    famotidine  40 MG tablet Commonly known as: PEPCID     Magnesium   250 MG Tabs    metoprolol  tartrate 25 MG tablet Commonly known as: LOPRESSOR     sucralfate  1 g tablet Commonly known as: CARAFATE     zinc gluconate 50 MG tablet           TAKE these medications     acetaminophen  500 MG tablet Commonly known as: TYLENOL  Take 500 mg by mouth every 6 (six) hours as needed for mild pain (pain score 1-3).    albuterol  108 (90 Base) MCG/ACT inhaler Commonly known as: VENTOLIN  HFA Inhale 2 puffs into the lungs every 6 (six) hours as needed for wheezing or shortness of breath.    amiodarone  200 MG tablet Commonly known as: PACERONE  Take 1 tablet (200 mg total) by mouth daily. Start taking on: Jul 30, 2023    bisacodyl  10 MG suppository Commonly known as: DULCOLAX Place 1 suppository (10 mg total) rectally daily as needed for moderate constipation.    busPIRone  5 MG tablet Commonly known as: BUSPAR  Take 1 tablet (5 mg total) by mouth 3 (three) times daily. What changed:  medication strength See the new instructions.    CHOLECALCIFEROL PO Take 5,000 Units by mouth daily.    Cyanocobalamin  2500 MCG Tabs Take 2,500 mcg by mouth once a week.    docusate 50 MG/5ML liquid Commonly known as: COLACE Take 10 mLs (100 mg total) by mouth 2 (two) times daily. Replaces: docusate sodium  100 MG capsule    Eliquis  5 MG Tabs tablet Generic drug: apixaban  TAKE 1 TABLET BY MOUTH TWICE  DAILY    escitalopram  20 MG tablet Commonly known as: LEXAPRO  TAKE 1 TABLET BY MOUTH DAILY FOR MOOD, ANXIETY &amp; IRRITABILITY    feeding supplement Liqd Take 237 mLs by mouth in the morning and at bedtime.    feeding supplement (OSMOLITE 1.5 CAL) Liqd Place 720 mLs into feeding tube daily. Nocturnal Tube Feeds: Osmolite 1.5 at 60 mL/hr x 12 hrs from 1800 to 0600    ferrous sulfate  325 (65 FE) MG tablet Take 1 tablet (325 mg total) by mouth every other day.    free water  Soln Place 100 mLs into feeding tube every 4 (four) hours.    furosemide  20 MG  tablet Commonly known as: Lasix  Take 1 tablet (20 mg total) by mouth daily.    insulin  aspart 100 UNIT/ML injection Commonly known as: novoLOG  Inject 0-20 Units into the skin every 4 (four) hours.    lidocaine  5 % Commonly known as: LIDODERM  Place 1 patch onto the skin daily. Remove & Discard patch within 12 hours or as directed by MD Start taking on: Jul 30, 2023    metoCLOPramide  10 MG tablet Commonly known as: REGLAN  Take 1 tablet (10 mg total)  by mouth 4 (four) times daily -  before meals and at bedtime.    metoprolol  succinate 25 MG 24 hr tablet Commonly known as: Toprol  XL Take 0.5 tablets (12.5 mg total) by mouth daily.    midodrine  10 MG tablet Commonly known as: PROAMATINE  Take 1 tablet (10 mg total) by mouth in the morning, at noon, and at bedtime.    nitroGLYCERIN  0.4 MG SL tablet Commonly known as: NITROSTAT  Place 1 tablet (0.4 mg total) under the tongue every 5 (five) minutes as needed for chest pain.    pantoprazole  40 MG tablet Commonly known as: Protonix  Take 1 tablet (40 mg total) by mouth 2 (two) times daily. Take  1 tablet  Daily  to prevent  Indigestion & Acid Reflux    polyethylene glycol 17 g packet Commonly known as: MIRALAX  / GLYCOLAX  Take 17 g by mouth daily. Start taking on: Jul 30, 2023    PROBIOTIC DAILY PO Take 1 tablet by mouth daily.    traMADol  50 MG tablet Commonly known as: ULTRAM  Take 1 tablet (50 mg total) by mouth every 6 (six) hours as needed for severe pain (pain score 7-10).    Vitamin C 500 MG Caps Take 500 mg by mouth.           Disposition: SNF Diet recommendation: Dysphagia type 1 thin Liquid   History of Present Illness         Darlene [name] is an 83 year old female with atrial fibrillation and gastrointestinal issues who presents with complications following a recent hospitalization and Rehab stay.  She was initially admitted to [facility] ER due to persistent vomiting and inability to retain food. After ER  discharge, she was unable to keep down her medication, leading to a return to the ER and subsequent admission at [facility]. During this hospitalization, she developed aspiration pneumonia, atrial fibrillation with RVR, and respiratory distress, requiring intubation for two weeks. She has limited recollection of this period due to the severity of her condition.  She has a history of atrial fibrillation and was on Eliquis  prior to her hospitalization. During her stay at a rehab facility, her anticoagulant was switched to Pradaxa, which she could not obtain, leading to a return to Eliquis , taken twice daily. She experienced a gastrointestinal bleed, which her family was informed might be related to a hiatal hernia and ulcers. No recent bleeding episodes have occurred. Patient does have upcoming appointment with cardiology  She underwent hernia surgery on May 7th, during which a feeding tube was placed. The tube site has become painful and emits an odor. She is now eating well and wants the feeding tube removed. Her daughter notes the site has been problematic since the stitches were removed on May 29th.  She has experienced anxiety attacks following her hospitalization and takes Lexapro  daily, with BuSpar  as needed. Her mood has improved, though some anxiety persists. She lives with her daughter and son-in-law, who provide support.  Recent blood work from May 24th showed improved anemia with a hemoglobin of 11.9. She has a history of intermittent anemia, managed with iron supplementation.  Her A1c is 5.3, indicating normal blood sugar levels, and she has not required insulin  since leaving the hospital. She takes B12 supplements weekly. Patient was place on sliding scale insulin  during hospitalization and during her Rehab stay. Daughter wonders if she needs to be on insulin  now. FBS have been in the 90's  She reports significant improvement in her breathing, estimating  it to be 80% better than during her  hospitalization. She has not used inhalers recently, though they are available if needed. No current chest pain or shortness of breath.  She was previously on diltiazem  for atrial fibrillation but was discontinued due to low blood pressure during her hospital stay and has not resumed it since discharge. Her daughter is concerned about the balance of medications affecting her blood pressure, was on midodrine  for hypotension during the hospital and during Rehab stay along with her blood pressure medications. Patient no longer experiencing hypotension  She has been gaining weight since leaving the nursing home, now weighing 213 pounds, and is eating three meals a day with additional nutritional supplements. She was previously on a pureed diet at the nursing home, which she found unpalatable. Patient has not been doing tube feeds for over 2 weeks now.   She has not experienced any falls since returning home and is using a walker for mobility, though she was previously using a cane before her hospitalization. She is participating in home health therapy to regain strength. Results LABS Hemoglobin: 11.9 g/dL (16/12/9602) Hematocrit: 35.8% (08/07/2023) B12: 1000 pg/mL (08/07/2023) A1c: 5.3% (08/07/2023)  DIAGNOSTIC Thoracentesis: Pleural fluid removed Assessment & Plan Gastroesophageal Reflux Disease (GERD) with Hiatal Hernia and Ulcers GERD complicated by hiatal hernia and gastric ulcers, causing gastrointestinal bleeding and aspiration pneumonia. Protonix  effective for GERD and ulcer healing. - Continue Protonix  for GERD management. - Follow up with GI specialist for hiatal hernia and ulcers evaluation.  PEG Tube Complications Potential infection at PEG tube site with foul smell and discharge. Site loosening increases infection risk. Patient has been eating on her own and no longer getting Tube feeds.  - Urgent referral to Dr. Aldon Hung for evaluation and potential PEG tube removal.  Atrial  Fibrillation with RVR while hospitalized Atrial fibrillation previously managed with Eliquis . Pradaxa used during rehab but has higher bleeding risk. Eliquis  preferred for better tolerance and lower bleeding risk. Continued anticoagulation necessary to reduce stroke risk. Daughter will see if patient can be seen sooner with cardiology.  - Discontinue Pradaxa. - Restart Eliquis , 5 mg twice daily. - Follow up with cardiologist, Dr. Amanda Jungling, for atrial fibrillation management.  Anemia Anemia managed with iron supplementation. Hemoglobin improved to 11.9 g/dL, likely due to recent gastrointestinal bleeding and ulcers. - Continue iron supplementation as needed.  Anxiety Anxiety managed with Lexapro  and BuSpar , with symptom improvement. - Continue Lexapro  daily. - Use BuSpar  as needed for anxiety.  General Health Maintenance Normal kidney function, improved anemia status, A1c at 5.3, no insulin  needed. Slightly elevated B12 levels not concerning. - Continue B12 supplementation as prescribed. - Monitor blood glucose levels occasionally.  Follow-up Follow-up required with cardiologist and surgeon for ongoing issues. Urgent referral to Dr. Aldon Hung for PEG tube site concerns. - Schedule follow-up with cardiologist, Dr. Amanda Jungling, on July 22. - Contact Dr. Aldon Hung for earlier appointment regarding PEG tube removal. Patient will need GI consult in the future but wants to wait until she gets PEG tube removed.     The ASCVD Risk score (Arnett DK, et al., 2019) failed to calculate for the following reasons:   The 2019 ASCVD risk score is only valid for ages 38 to 42  Past Medical History:  Diagnosis Date   Anxiety    Chronic lower back pain    Depression    DJD (degenerative joint disease)    knees, neck   GERD (gastroesophageal reflux disease)    has  had dilitation in the past   Hypertension    Past Surgical History:  Procedure Laterality Date   ABDOMINAL HYSTERECTOMY  03/1979    BREAST BIOPSY Left 1986 and 1987   CATARACT EXTRACTION, BILATERAL Bilateral 2014   Dr. Michaeleen Adler, in [place]   ESOPHAGOGASTRODUODENOSCOPY N/A 07/01/2023   Procedure: EGD (ESOPHAGOGASTRODUODENOSCOPY);  Surgeon: Daina Drum, MD;  Location: [facility] ENDOSCOPY;  Service: Gastroenterology;  Laterality: N/A;  Sedation provided by ICU   [facility]   INSERTION, GASTROSTOMY TUBE, ROBOT-ASSISTED Left 07/21/2023   Procedure: INSERTION, GASTROSTOMY TUBE, ROBOT-ASSISTED;  Surgeon: Adalberto Acton, MD;  Location: [facility] OR;  Service: General;  Laterality: Left;   XI ROBOTIC ASSISTED PARAESOPHAGEAL HERNIA REPAIR N/A 07/21/2023   Procedure: REPAIR, HERNIA, PARAESOPHAGEAL, ROBOT-ASSISTED;  Surgeon: Adalberto Acton, MD;  Location: [facility] OR;  Service: General;  Laterality: N/A;  ROBOTIC PARAESOPHAGEAL HERNIA REPAIR, ROBOTIC REPAIR OF SPIGELIAN HERNIA   Social History   Tobacco Use   Smoking status: Never   Smokeless tobacco: Never  Vaping Use   Vaping status: Never Used  Substance Use Topics   Alcohol use: Yes    Alcohol/week: 3.0 standard drinks of alcohol    Types: 3 Standard drinks or equivalent per week    Comment: 3 nights out of the week   Drug use: No   Family History  Problem Relation Age of Onset   Hypertension Mother    Heart disease Mother    Hypertension Father    Heart disease Father    Tremor Sister    Dementia Brother    Hypertension Daughter    Colon cancer Neg Hx    Esophageal cancer Neg Hx    Liver disease Neg Hx    Allergies  Allergen Reactions   Cephalosporins Rash    Tolerated ceftriaxone  06/2023   Keflex [Cephalexin] Rash    ROS    Objective:    BP 134/84   Pulse 83   Temp 98.5 F (36.9 C)   Ht 5\' 10"  (1.778 m)   Wt 213 lb (96.6 kg)   SpO2 96%   BMI 30.56 kg/m  BP Readings from Last 3 Encounters:  08/17/23 134/84  07/29/23 119/67  06/28/23 115/62   Wt Readings from Last 3 Encounters:  08/17/23 213 lb (96.6 kg)   07/19/23 214 lb 15.2 oz (97.5 kg)  06/28/23 228 lb 8.1 oz (103.7 kg)    Physical Exam Vitals and nursing note reviewed.  Constitutional:      General: She is not in acute distress.    Appearance: Normal appearance.     Comments: Comes in with her daughter in a wheelchair  HENT:     Head: Normocephalic.     Right Ear: Tympanic membrane, ear canal and external ear normal.     Left Ear: Tympanic membrane, ear canal and external ear normal.  Eyes:     Extraocular Movements: Extraocular movements intact.     Pupils: Pupils are equal, round, and reactive to light.  Cardiovascular:     Rate and Rhythm: Normal rate. Rhythm irregular.     Heart sounds: Normal heart sounds.  Pulmonary:     Effort: Pulmonary effort is normal.     Breath sounds: Normal breath sounds. No wheezing.  Abdominal:     Palpations: Abdomen is soft.     Comments: Has PEG tube with yellowish/brownish drainage, some irritation around PEG tube and loosening (has smell)  Musculoskeletal:  Right lower leg: No edema.     Left lower leg: No edema.  Neurological:     General: No focal deficit present.     Mental Status: She is alert and oriented to person, place, and time.  Psychiatric:        Mood and Affect: Mood normal.        Behavior: Behavior normal.        Judgment: Judgment normal.      No results found for any visits on 08/17/23.

## 2023-08-18 NOTE — Addendum Note (Signed)
 Addended by: Nilsa Bash on: 08/18/2023 07:46 AM   Modules accepted: Level of Service

## 2023-08-23 ENCOUNTER — Telehealth: Payer: Self-pay

## 2023-08-23 NOTE — Telephone Encounter (Signed)
 Please see previous encounter. Sent to provider for review.

## 2023-08-25 ENCOUNTER — Telehealth: Payer: Self-pay

## 2023-08-25 NOTE — Telephone Encounter (Signed)
 Copied from CRM [id number]. Topic: Clinical - Home Health Verbal Orders >> Aug 24, 2023  2:11 PM Donald Frost wrote: Caller/Agency: [place] OT with [facility] Callback Number: [phone] Service Requested: Occupational Therapy Frequency: 1 x a week for 6 weeks Any new concerns about the patient? No  Please assist further

## 2023-08-25 NOTE — Telephone Encounter (Signed)
 According to Darlene [name] due to nature of pt. Condition and symptoms she needs a call from office nurse to evaluate symptoms in case there is a need for clinical visit.

## 2023-08-26 ENCOUNTER — Telehealth: Payer: Self-pay

## 2023-08-26 NOTE — Telephone Encounter (Signed)
 Malorie from home health center well need OT at least 1 time a week for 6 weeks. [phone] contact number. Please advise.

## 2023-08-26 NOTE — Telephone Encounter (Signed)
 Left message on VM to call us  since she does not have her name attached to VM.

## 2023-08-30 NOTE — Telephone Encounter (Signed)
 MA returned call to review pt. Symptoms.  Pt. Daughter Darlene [name] reported that  pt was seen at [facility] and was DX w/ Type Flu A, along w/ a GI Bleed.   The UC refused to complete a chest x ray due to the fact the pt. Could not stand. MA reported this to the Provider.   Response was if pt. Is having a fever, SOB , chest pain, not able to walk or eat she needs to be re evaluated by UC or seen by the ED to ensure that this is not underlying symptoms of current issues.   Daughter reported that currently pt. Is not wanting to eat only taking ensure supplements, she is having trouble standing, walking across the room causes severe SOB and pt. Is having chest congestion.   Daughter stated that she would speak w/ the pt. To see what she wanted to do. That she would take her to another urgent care.   MA offered that there is a UC that is [facility] or to be seen in the ED. She also could call to our call center and ask to be seen by a sister office if no other options worked out. Pt. Daugher agreed to see what would work best for the pt.

## 2023-08-31 ENCOUNTER — Emergency Department (HOSPITAL_COMMUNITY)

## 2023-08-31 ENCOUNTER — Ambulatory Visit
Admission: EM | Admit: 2023-08-31 | Discharge: 2023-08-31 | Disposition: A | Discharge status: Home / Self Care | Attending: Nurse Practitioner | Admitting: Nurse Practitioner

## 2023-08-31 ENCOUNTER — Other Ambulatory Visit: Payer: Self-pay

## 2023-08-31 ENCOUNTER — Inpatient Hospital Stay (HOSPITAL_COMMUNITY)
Admission: EM | Admit: 2023-08-31 | Discharge: 2023-09-03 | DRG: 871 | Disposition: A | Discharge status: Home Health | Attending: Student | Admitting: Student

## 2023-08-31 ENCOUNTER — Encounter (HOSPITAL_COMMUNITY): Payer: Self-pay

## 2023-08-31 DIAGNOSIS — G4733 Obstructive sleep apnea (adult) (pediatric): Secondary | ICD-10-CM | POA: Diagnosis present

## 2023-08-31 DIAGNOSIS — R0602 Shortness of breath: Secondary | ICD-10-CM | POA: Diagnosis not present

## 2023-08-31 DIAGNOSIS — I4811 Longstanding persistent atrial fibrillation: Secondary | ICD-10-CM | POA: Diagnosis not present

## 2023-08-31 DIAGNOSIS — Z6830 Body mass index (BMI) 30.0-30.9, adult: Secondary | ICD-10-CM

## 2023-08-31 DIAGNOSIS — R112 Nausea with vomiting, unspecified: Secondary | ICD-10-CM

## 2023-08-31 DIAGNOSIS — Z1152 Encounter for screening for COVID-19: Secondary | ICD-10-CM

## 2023-08-31 DIAGNOSIS — A419 Sepsis, unspecified organism: Secondary | ICD-10-CM | POA: Diagnosis present

## 2023-08-31 DIAGNOSIS — J690 Pneumonitis due to inhalation of food and vomit: Secondary | ICD-10-CM | POA: Diagnosis present

## 2023-08-31 DIAGNOSIS — Z881 Allergy status to other antibiotic agents status: Secondary | ICD-10-CM

## 2023-08-31 DIAGNOSIS — F419 Anxiety disorder, unspecified: Secondary | ICD-10-CM | POA: Diagnosis present

## 2023-08-31 DIAGNOSIS — Z888 Allergy status to other drugs, medicaments and biological substances status: Secondary | ICD-10-CM

## 2023-08-31 DIAGNOSIS — E669 Obesity, unspecified: Secondary | ICD-10-CM | POA: Diagnosis present

## 2023-08-31 DIAGNOSIS — I4891 Unspecified atrial fibrillation: Secondary | ICD-10-CM

## 2023-08-31 DIAGNOSIS — K219 Gastro-esophageal reflux disease without esophagitis: Secondary | ICD-10-CM | POA: Diagnosis present

## 2023-08-31 DIAGNOSIS — Z91199 Patient's noncompliance with other medical treatment and regimen due to unspecified reason: Secondary | ICD-10-CM

## 2023-08-31 DIAGNOSIS — Z7901 Long term (current) use of anticoagulants: Secondary | ICD-10-CM | POA: Diagnosis not present

## 2023-08-31 DIAGNOSIS — R531 Weakness: Secondary | ICD-10-CM

## 2023-08-31 DIAGNOSIS — Z6829 Body mass index (BMI) 29.0-29.9, adult: Secondary | ICD-10-CM

## 2023-08-31 DIAGNOSIS — D649 Anemia, unspecified: Secondary | ICD-10-CM | POA: Diagnosis present

## 2023-08-31 DIAGNOSIS — J189 Pneumonia, unspecified organism: Secondary | ICD-10-CM | POA: Diagnosis present

## 2023-08-31 DIAGNOSIS — E440 Moderate protein-calorie malnutrition: Secondary | ICD-10-CM | POA: Diagnosis present

## 2023-08-31 DIAGNOSIS — E872 Acidosis, unspecified: Secondary | ICD-10-CM | POA: Diagnosis present

## 2023-08-31 DIAGNOSIS — Z90710 Acquired absence of both cervix and uterus: Secondary | ICD-10-CM | POA: Diagnosis not present

## 2023-08-31 DIAGNOSIS — E876 Hypokalemia: Secondary | ICD-10-CM | POA: Diagnosis present

## 2023-08-31 DIAGNOSIS — Z8249 Family history of ischemic heart disease and other diseases of the circulatory system: Secondary | ICD-10-CM

## 2023-08-31 DIAGNOSIS — R6520 Severe sepsis without septic shock: Secondary | ICD-10-CM | POA: Diagnosis present

## 2023-08-31 DIAGNOSIS — M6281 Muscle weakness (generalized): Secondary | ICD-10-CM | POA: Diagnosis present

## 2023-08-31 DIAGNOSIS — I10 Essential (primary) hypertension: Secondary | ICD-10-CM | POA: Diagnosis present

## 2023-08-31 DIAGNOSIS — F32A Depression, unspecified: Secondary | ICD-10-CM | POA: Diagnosis present

## 2023-08-31 DIAGNOSIS — E785 Hyperlipidemia, unspecified: Secondary | ICD-10-CM | POA: Diagnosis present

## 2023-08-31 DIAGNOSIS — K224 Dyskinesia of esophagus: Secondary | ICD-10-CM | POA: Diagnosis present

## 2023-08-31 DIAGNOSIS — Z79899 Other long term (current) drug therapy: Secondary | ICD-10-CM

## 2023-08-31 DIAGNOSIS — Z931 Gastrostomy status: Secondary | ICD-10-CM

## 2023-08-31 DIAGNOSIS — K2100 Gastro-esophageal reflux disease with esophagitis, without bleeding: Secondary | ICD-10-CM | POA: Diagnosis not present

## 2023-08-31 DIAGNOSIS — I4821 Permanent atrial fibrillation: Secondary | ICD-10-CM | POA: Diagnosis present

## 2023-08-31 DIAGNOSIS — R059 Cough, unspecified: Secondary | ICD-10-CM

## 2023-08-31 DIAGNOSIS — R5381 Other malaise: Secondary | ICD-10-CM | POA: Diagnosis present

## 2023-08-31 LAB — COMPREHENSIVE METABOLIC PANEL WITH GFR
ALT: 35 U/L (ref 0–44)
AST: 23 U/L (ref 15–41)
Albumin: 3 g/dL — ABNORMAL LOW (ref 3.5–5.0)
Alkaline Phosphatase: 55 U/L (ref 38–126)
Anion gap: 11 (ref 5–15)
BUN: 18 mg/dL (ref 8–23)
CO2: 24 mmol/L (ref 22–32)
Calcium: 9.3 mg/dL (ref 8.9–10.3)
Chloride: 101 mmol/L (ref 98–111)
Creatinine, Ser: 0.87 mg/dL (ref 0.44–1.00)
GFR, Estimated: >60 mL/min (ref >60)
Glucose, Bld: 157 mg/dL — ABNORMAL HIGH (ref 70–99)
Potassium: 3.2 mmol/L — ABNORMAL LOW (ref 3.5–5.1)
Sodium: 136 mmol/L (ref 135–145)
Total Bilirubin: 1.2 mg/dL (ref 0.0–1.2)
Total Protein: 6.3 g/dL — ABNORMAL LOW (ref 6.5–8.1)

## 2023-08-31 LAB — LACTIC ACID, PLASMA
Lactic Acid, Venous: 2.6 mmol/L (ref 0.5–1.9)
Lactic Acid, Venous: 2.5 mmol/L (ref 0.5–1.9)

## 2023-08-31 LAB — CBC WITH DIFFERENTIAL/PLATELET
Abs Immature Granulocytes: 0.1 10*3/uL — ABNORMAL HIGH (ref 0.00–0.07)
Basophils Absolute: 0 10*3/uL (ref 0.0–0.1)
Basophils Relative: 0 %
Eosinophils Absolute: 0 10*3/uL (ref 0.0–0.5)
Eosinophils Relative: 0 %
HCT: 40 % (ref 36.0–46.0)
Hemoglobin: 12.8 g/dL (ref 12.0–15.0)
Immature Granulocytes: 1 %
Lymphocytes Relative: 5 %
Lymphs Abs: 0.9 10*3/uL (ref 0.7–4.0)
MCH: 31 pg (ref 26.0–34.0)
MCHC: 32 g/dL (ref 30.0–36.0)
MCV: 96.9 fL (ref 80.0–100.0)
Monocytes Absolute: 1.1 10*3/uL — ABNORMAL HIGH (ref 0.1–1.0)
Monocytes Relative: 6 %
Neutro Abs: 16.3 10*3/uL — ABNORMAL HIGH (ref 1.7–7.7)
Neutrophils Relative %: 88 %
Platelets: 262 10*3/uL (ref 150–400)
RBC: 4.13 MIL/uL (ref 3.87–5.11)
RDW: 13.7 % (ref 11.5–15.5)
WBC: 18.5 10*3/uL — ABNORMAL HIGH (ref 4.0–10.5)
nRBC: 0 % (ref 0.0–0.2)

## 2023-08-31 LAB — URINALYSIS, W/ REFLEX TO CULTURE (INFECTION SUSPECTED)
Bilirubin Urine: NEGATIVE
Glucose, UA: NEGATIVE mg/dL
Hgb urine dipstick: NEGATIVE
Ketones, ur: NEGATIVE mg/dL
Nitrite: NEGATIVE
Protein, ur: NEGATIVE mg/dL
Specific Gravity, Urine: >1.046 — ABNORMAL HIGH (ref 1.005–1.030)
pH: 5 (ref 5.0–8.0)

## 2023-08-31 LAB — I-STAT CG4 LACTIC ACID, ED
Lactic Acid, Venous: 2.2 mmol/L (ref 0.5–1.9)
Lactic Acid, Venous: 2.8 mmol/L (ref 0.5–1.9)

## 2023-08-31 LAB — RESP PANEL BY RT-PCR (RSV, FLU A&B, COVID)  RVPGX2
Influenza A by PCR: NEGATIVE
Influenza B by PCR: NEGATIVE
Resp Syncytial Virus by PCR: NEGATIVE
SARS Coronavirus 2 by RT PCR: NEGATIVE

## 2023-08-31 LAB — SARS CORONAVIRUS 2 BY RT PCR: SARS Coronavirus 2 by RT PCR: NEGATIVE

## 2023-08-31 LAB — PROTIME-INR
INR: 2.1 — ABNORMAL HIGH (ref 0.8–1.2)
Prothrombin Time: 23.5 s — ABNORMAL HIGH (ref 11.4–15.2)

## 2023-08-31 MED ORDER — APIXABAN 5 MG PO TABS
5.0000 mg | ORAL_TABLET | Freq: Two times a day (BID) | ORAL | Status: DC
  Administered 2023-08-31 – 2023-09-03 (×6): 5 mg via ORAL
  Filled 2023-08-31 (×6): qty 1

## 2023-08-31 MED ORDER — DILTIAZEM HCL ER COATED BEADS 180 MG PO CP24
180.0000 mg | ORAL_CAPSULE | Freq: Every day | ORAL | Status: DC
  Administered 2023-09-01 – 2023-09-02 (×2): 180 mg via ORAL
  Filled 2023-08-31 (×2): qty 1

## 2023-08-31 MED ORDER — SODIUM CHLORIDE 0.9 % IV SOLN
500.0000 mg | Freq: Once | INTRAVENOUS | Status: AC
  Administered 2023-08-31: 500 mg via INTRAVENOUS
  Filled 2023-08-31: qty 5

## 2023-08-31 MED ORDER — IOHEXOL 350 MG/ML SOLN
75.0000 mL | Freq: Once | INTRAVENOUS | Status: AC | PRN
  Administered 2023-08-31: 75 mL via INTRAVENOUS

## 2023-08-31 MED ORDER — FUROSEMIDE 20 MG PO TABS
20.0000 mg | ORAL_TABLET | Freq: Every day | ORAL | Status: DC
  Administered 2023-09-01 – 2023-09-03 (×3): 20 mg via ORAL
  Filled 2023-08-31 (×3): qty 1

## 2023-08-31 MED ORDER — LACTATED RINGERS IV BOLUS (SEPSIS)
500.0000 mL | Freq: Once | INTRAVENOUS | Status: AC
  Administered 2023-08-31: 500 mL via INTRAVENOUS

## 2023-08-31 MED ORDER — SODIUM CHLORIDE 0.9 % IV SOLN
2.0000 g | INTRAVENOUS | Status: DC
  Administered 2023-09-01 – 2023-09-03 (×3): 2 g via INTRAVENOUS
  Filled 2023-08-31 (×3): qty 20

## 2023-08-31 MED ORDER — TRAMADOL HCL 50 MG PO TABS: 50.0000 mg | ORAL_TABLET | Freq: Four times a day (QID) | ORAL | Status: DC | PRN

## 2023-08-31 MED ORDER — VANCOMYCIN HCL 2000 MG/400ML IV SOLN
2000.0000 mg | Freq: Once | INTRAVENOUS | Status: AC
  Administered 2023-08-31: 2000 mg via INTRAVENOUS
  Filled 2023-08-31: qty 400

## 2023-08-31 MED ORDER — VANCOMYCIN HCL 1750 MG/350ML IV SOLN
1750.0000 mg | INTRAVENOUS | Status: DC
  Filled 2023-08-31: qty 350

## 2023-08-31 MED ORDER — LEVOFLOXACIN IN D5W 750 MG/150ML IV SOLN: 750.0000 mg | Freq: Once | INTRAVENOUS | Status: DC

## 2023-08-31 MED ORDER — RISAQUAD PO CAPS
1.0000 | ORAL_CAPSULE | Freq: Every day | ORAL | Status: DC
  Administered 2023-09-01 – 2023-09-03 (×3): 1 via ORAL
  Filled 2023-08-31 (×3): qty 1

## 2023-08-31 MED ORDER — METOPROLOL SUCCINATE ER 25 MG PO TB24
12.5000 mg | ORAL_TABLET | Freq: Every day | ORAL | Status: DC
  Administered 2023-09-01 – 2023-09-02 (×2): 12.5 mg via ORAL
  Filled 2023-08-31 (×2): qty 1

## 2023-08-31 MED ORDER — IPRATROPIUM-ALBUTEROL 0.5-2.5 (3) MG/3ML IN SOLN
3.0000 mL | RESPIRATORY_TRACT | Status: DC
  Filled 2023-08-31: qty 3

## 2023-08-31 MED ORDER — PANTOPRAZOLE SODIUM 40 MG PO TBEC
40.0000 mg | DELAYED_RELEASE_TABLET | Freq: Two times a day (BID) | ORAL | Status: DC
  Administered 2023-08-31 – 2023-09-03 (×6): 40 mg via ORAL
  Filled 2023-08-31 (×6): qty 1

## 2023-08-31 MED ORDER — SODIUM CHLORIDE 0.9 % IV SOLN
500.0000 mg | INTRAVENOUS | Status: AC
  Administered 2023-09-01 – 2023-09-03 (×3): 500 mg via INTRAVENOUS
  Filled 2023-08-31 (×3): qty 5

## 2023-08-31 MED ORDER — ESCITALOPRAM OXALATE 10 MG PO TABS
20.0000 mg | ORAL_TABLET | Freq: Every day | ORAL | Status: DC
  Administered 2023-09-01 – 2023-09-03 (×3): 20 mg via ORAL
  Filled 2023-08-31 (×3): qty 2

## 2023-08-31 MED ORDER — ALBUTEROL SULFATE (2.5 MG/3ML) 0.083% IN NEBU
2.5000 mg | INHALATION_SOLUTION | RESPIRATORY_TRACT | Status: DC | PRN
  Administered 2023-09-02: 2.5 mg via RESPIRATORY_TRACT
  Filled 2023-08-31: qty 3

## 2023-08-31 MED ORDER — FERROUS SULFATE 325 (65 FE) MG PO TABS
325.0000 mg | ORAL_TABLET | ORAL | Status: DC
  Administered 2023-08-31 – 2023-09-02 (×2): 325 mg via ORAL
  Filled 2023-08-31 (×2): qty 1

## 2023-08-31 MED ORDER — BUSPIRONE HCL 10 MG PO TABS
5.0000 mg | ORAL_TABLET | Freq: Three times a day (TID) | ORAL | Status: DC
  Administered 2023-08-31 – 2023-09-03 (×9): 5 mg via ORAL
  Filled 2023-08-31: qty 0.5
  Filled 2023-08-31 (×3): qty 1
  Filled 2023-08-31 (×2): qty 0.5
  Filled 2023-08-31: qty 1
  Filled 2023-08-31: qty 0.5
  Filled 2023-08-31: qty 1
  Filled 2023-08-31: qty 0.5
  Filled 2023-08-31: qty 1
  Filled 2023-08-31 (×2): qty 0.5
  Filled 2023-08-31 (×2): qty 1
  Filled 2023-08-31 (×3): qty 0.5

## 2023-08-31 MED ORDER — SODIUM CHLORIDE 0.9 % IV SOLN
1.0000 g | Freq: Once | INTRAVENOUS | Status: AC
  Administered 2023-08-31: 1 g via INTRAVENOUS
  Filled 2023-08-31: qty 10

## 2023-08-31 MED ORDER — AMIODARONE HCL 200 MG PO TABS
200.0000 mg | ORAL_TABLET | Freq: Every day | ORAL | Status: DC
  Administered 2023-09-01 – 2023-09-03 (×3): 200 mg via ORAL
  Filled 2023-08-31 (×3): qty 1

## 2023-08-31 MED ORDER — LACTATED RINGERS IV SOLN: INTRAVENOUS | Status: DC

## 2023-08-31 MED ORDER — LACTATED RINGERS IV BOLUS (SEPSIS)
1000.0000 mL | Freq: Once | INTRAVENOUS | Status: AC
  Administered 2023-08-31: 1000 mL via INTRAVENOUS

## 2023-08-31 MED ORDER — IPRATROPIUM-ALBUTEROL 0.5-2.5 (3) MG/3ML IN SOLN
3.0000 mL | Freq: Once | RESPIRATORY_TRACT | Status: AC
  Administered 2023-08-31: 3 mL via RESPIRATORY_TRACT

## 2023-08-31 NOTE — ED Triage Notes (Signed)
 Per family member, her doctor said she needed to be seen because she has a cough, SOB, weak, N/V, rattle in chest and feeding tube odor x 1 day

## 2023-08-31 NOTE — Sepsis Progress Note (Signed)
 Elink will follow per sepsis protocol.

## 2023-08-31 NOTE — Progress Notes (Signed)
 Pharmacy Antibiotic Note  Darlene [name] is a 83 y.o. female admitted on 08/31/2023 with pneumonia. CXR demonstrated R basilar opacity, possible acute infiltrate, atelectasis. Pharmacy has been consulted for vancomycin  dosing. Patient started on Azithromycin  500mg  IV q24h and Ceftriaxone  2g IV q24h in the ED.   Scr 0.87, WBC 18.5, Lactate 2.8, Tmax 98.2  Plan: Vancomycin  2000 mg IV x 1,  followed by 1750 mg IV q24H (AUC 527.2, Scr used 0.87, IBW, Vd 0.72) Monitor renal function, clinical status Follow up cultures, ability to de-escalate, LOT   Height: 5' 10 (177.8 cm) Weight: 95.3 kg (210 lb) IBW/kg (Calculated) : 68.5  Temp (24hrs), Avg:98.4 F (36.9 C), Min:98 F (36.7 C), Max:98.8 F (37.1 C)  Recent Labs  Lab 08/31/23 1057 08/31/23 1440 08/31/23 1625  WBC 18.5*  --   --   CREATININE 0.87  --   --   LATICACIDVEN  --  2.2* 2.8*    Estimated Creatinine Clearance: 61.3 mL/min (by C-G formula based on SCr of 0.87 mg/dL).    Allergies  Allergen Reactions   Cephalosporins Rash    Tolerated ceftriaxone  06/2023   Keflex [Cephalexin] Rash    Antimicrobials this admission: Vancomycin  6/17 >>   Microbiology results: 6/17 BCx: sent 6/17 Sputum: sent 6/17 Resp PCR: sent   6/17 MRSA PCR: sent 6/17 COVID PCR: neg   Thank you for allowing pharmacy to be a part of this patient's care.  Shreeya Recendiz 08/31/2023 6:18 PM

## 2023-08-31 NOTE — ED Notes (Signed)
 Patient is being discharged from the Urgent Care and sent to the Emergency Department via POV . Per provider, patient is in need of higher level of care due to [facility] and weakness. Patient is aware and verbalizes understanding of plan of care.  Vitals:   08/31/23 0839  BP: 102/69  Pulse: 85  Resp: (!) 26  Temp: 98.8 F (37.1 C)  SpO2: (!) 88%

## 2023-08-31 NOTE — ED Provider Notes (Signed)
 [facility]    CSN: [id number] Arrival date & time: 08/31/23  1601      History   Chief Complaint Chief Complaint  Patient presents with   Weakness    HPI Darlene [name] is a 83 y.o. female.   The history is provided by a relative (daughter).   Patient brought in by her daughter for complaints of weakness, nausea, vomiting, shortness of breath, and cough.  Daughter also reports patient has an odor around her feeding tube.  Symptoms have been present for the past several days.  Daughter reports patient was recently discharged from a rehab facility at the end of May.  Daughter states patient has not been eating as much and has a cough that has a rattle.  Daughter denies fever, chills, chest pain, abdominal pain, diarrhea, or constipation.  Per review of the chart, patient has been seen by her PCP since discharge from the rehab facility.  Daughter states that her doctor told her she needed to be seen at an urgent care.  Past Medical History:  Diagnosis Date   Anxiety    Chronic lower back pain    Depression    DJD (degenerative joint disease)    knees, neck   GERD (gastroesophageal reflux disease)    has had dilitation in the past   Hypertension     Patient Active Problem List   Diagnosis Date Noted   Anxiety 08/17/2023   Hiatal hernia 07/14/2023   Acute pulmonary edema (HCC) 07/14/2023   Aspiration into airway 07/06/2023   Acute respiratory failure with hypoxia (HCC) 06/30/2023   Acute GI bleeding 06/29/2023   Encounter to establish care with new provider 06/15/2023   Class 1 obesity with serious comorbidity and body mass index (BMI) of 32.0 to 32.9 in adult 06/15/2023   Physical deconditioning 01/15/2020   At moderate risk for fall 01/15/2020   Pulmonary hypertension (HCC) 01/12/2020   Central sleep apnea associated with atrial fibrillation (HCC) 06/26/2019   Severe obstructive sleep apnea-hypopnea syndrome 05/31/2019   Hypoxemia requiring supplemental  oxygen 05/09/2019   LVH (left ventricular hypertrophy) due to hypertensive disease, without heart failure 05/09/2019   Atrial fibrillation with RVR (HCC) 04/15/2019   Esophageal stricture 09/04/2018   Iron deficiency anemia 09/04/2018   Vitamin D  deficiency 02/16/2018   Hyperlipidemia, mixed 10/01/2017   History of colon polyps 09/29/2017   GERD (gastroesophageal reflux disease) 04/01/2017   DJD (degenerative joint disease)    Chronic lower back pain    Depression     Past Surgical History:  Procedure Laterality Date   ABDOMINAL HYSTERECTOMY  03/1979   BREAST BIOPSY Left 1986 and 1987   CATARACT EXTRACTION, BILATERAL Bilateral 2014   Dr. Michaeleen Adler, in [place]   ESOPHAGOGASTRODUODENOSCOPY N/A 07/01/2023   Procedure: EGD (ESOPHAGOGASTRODUODENOSCOPY);  Surgeon: Daina Drum, MD;  Location: [facility] ENDOSCOPY;  Service: Gastroenterology;  Laterality: N/A;  Sedation provided by ICU   [facility]   INSERTION, GASTROSTOMY TUBE, ROBOT-ASSISTED Left 07/21/2023   Procedure: INSERTION, GASTROSTOMY TUBE, ROBOT-ASSISTED;  Surgeon: Adalberto Acton, MD;  Location: [facility] OR;  Service: General;  Laterality: Left;   XI ROBOTIC ASSISTED PARAESOPHAGEAL HERNIA REPAIR N/A 07/21/2023   Procedure: REPAIR, HERNIA, PARAESOPHAGEAL, ROBOT-ASSISTED;  Surgeon: Adalberto Acton, MD;  Location: [facility] OR;  Service: General;  Laterality: N/A;  ROBOTIC PARAESOPHAGEAL HERNIA REPAIR, ROBOTIC REPAIR OF SPIGELIAN HERNIA    OB History   No obstetric history  on file.      Home Medications    Prior to Admission medications   Medication Sig Start Date End Date Taking? Authorizing Provider  acetaminophen  (TYLENOL ) 500 MG tablet Take 500 mg by mouth every 6 (six) hours as needed for mild pain (pain score 1-3).    [provider]  albuterol  (VENTOLIN  HFA) 108 (90 Base) MCG/ACT inhaler Inhale 2 puffs into the lungs every 6 (six) hours as needed for wheezing or shortness of breath.  05/22/22   Cranford, Tonya, NP  amiodarone  (PACERONE ) 200 MG tablet Take 1 tablet (200 mg total) by mouth daily. 07/30/23   Kraig Peru, MD  apixaban  (ELIQUIS ) 5 MG TABS tablet TAKE 1 TABLET BY MOUTH TWICE  DAILY 06/07/23   Laurann Pollock, MD  Ascorbic Acid (VITAMIN C) 500 MG CAPS Take 500 mg by mouth.     [provider]  bisacodyl  (DULCOLAX) 10 MG suppository Place 1 suppository (10 mg total) rectally daily as needed for moderate constipation. Patient not taking: Reported on 08/17/2023 07/29/23   Kraig Peru, MD  busPIRone  (BUSPAR ) 5 MG tablet Take 1 tablet (5 mg total) by mouth 3 (three) times daily. 07/29/23   Patel, Pranav M, MD  CHOLECALCIFEROL PO Take 5,000 Units by mouth daily.    [provider]  Cyanocobalamin  2500 MCG TABS Take 2,500 mcg by mouth once a week.    [provider]  diltiazem  (CARDIZEM  CD) 180 MG 24 hr capsule Take 1 capsule (180 mg total) by mouth daily. 08/17/23   Amadeo June, MD  docusate (COLACE) 50 MG/5ML liquid Take 10 mLs (100 mg total) by mouth 2 (two) times daily. 07/29/23   Kraig Peru, MD  escitalopram  (LEXAPRO ) 20 MG tablet TAKE 1 TABLET BY MOUTH DAILY FOR MOOD, ANXIETY &amp; IRRITABILITY 10/10/22   Wilkinson, Dana E, FNP  feeding supplement (ENSURE ENLIVE / ENSURE PLUS) LIQD Take 237 mLs by mouth in the morning and at bedtime. 07/29/23   Kraig Peru, MD  ferrous sulfate  325 (65 FE) MG tablet Take 1 tablet (325 mg total) by mouth every other day. 01/15/20   [place] Bureau, NP  furosemide  (LASIX ) 20 MG tablet Take 1 tablet (20 mg total) by mouth daily. 07/29/23 07/28/24  Kraig Peru, MD  lidocaine  (LIDODERM ) 5 % Place 1 patch onto the skin daily. Remove & Discard patch within 12 hours or as directed by MD Patient not taking: Reported on 08/17/2023 07/30/23   Kraig Peru, MD  metoCLOPramide  (REGLAN ) 10 MG tablet Take 1 tablet (10 mg total) by mouth 4 (four) times daily -  before meals and at bedtime. 07/29/23   Kraig Peru, MD  metoprolol  succinate (TOPROL  XL) 25 MG 24 hr tablet Take 0.5 tablets (12.5 mg total) by mouth daily. 07/29/23 07/28/24  Kraig Peru, MD  nitroGLYCERIN  (NITROSTAT ) 0.4 MG SL tablet Place 1 tablet (0.4 mg total) under the tongue every 5 (five) minutes as needed for chest pain. 02/05/20   Strader, Dimple Francis, PA-C  pantoprazole  (PROTONIX ) 40 MG tablet Take 1 tablet (40 mg total) by mouth 2 (two) times daily. Take  1 tablet  Daily  to prevent  Indigestion & Acid Reflux 06/28/23   Myrl Askew, MD  Probiotic Product (PROBIOTIC DAILY PO) Take 1 tablet by mouth daily.    [provider]  traMADol  (ULTRAM ) 50 MG tablet Take 1 tablet (50 mg total) by mouth every 6 (six) hours as needed for severe pain (pain  score 7-10). 07/29/23   Kraig Peru, MD  Water  For Irrigation, Sterile (FREE WATER ) SOLN Place 100 mLs into feeding tube every 4 (four) hours. Patient not taking: Reported on 08/17/2023 07/29/23   Kraig Peru, MD    Family History Family History  Problem Relation Age of Onset   Hypertension Mother    Heart disease Mother    Hypertension Father    Heart disease Father    Tremor Sister    Dementia Brother    Hypertension Daughter    Colon cancer Neg Hx    Esophageal cancer Neg Hx    Liver disease Neg Hx     Social History Social History   Tobacco Use   Smoking status: Never   Smokeless tobacco: Never  Vaping Use   Vaping status: Never Used  Substance Use Topics   Alcohol use: Yes    Alcohol/week: 3.0 standard drinks of alcohol    Types: 3 Standard drinks or equivalent per week    Comment: 3 nights out of the week   Drug use: No     Allergies   Cephalosporins and Keflex [cephalexin]   Review of Systems Review of Systems Per HPI  Physical Exam Triage Vital Signs ED Triage Vitals  Encounter Vitals Group     BP 08/31/23 0839 102/69     Girls Systolic BP Percentile --      Girls Diastolic BP Percentile --      Boys Systolic BP Percentile --       Boys Diastolic BP Percentile --      Pulse Rate 08/31/23 0839 85     Resp 08/31/23 0839 (!) 26     Temp 08/31/23 0839 98.8 F (37.1 C)     Temp Source 08/31/23 0839 Oral     SpO2 08/31/23 0839 (!) 88 %     Weight --      Height --      Head Circumference --      Peak Flow --      Pain Score 08/31/23 0840 0     Pain Loc --      Pain Education --      Exclude from Growth Chart --    No data found.  Updated Vital Signs BP 102/69 (BP Location: Right Arm)   Pulse 85   Temp 98.8 F (37.1 C) (Oral)   Resp (!) 26   SpO2 (!) 88%   Visual Acuity Right Eye Distance:   Left Eye Distance:   Bilateral Distance:    Right Eye Near:   Left Eye Near:    Bilateral Near:     Physical Exam Vitals and nursing note reviewed.  Constitutional:      Appearance: Normal appearance. She is ill-appearing.  HENT:     Head: Normocephalic.   Eyes:     Extraocular Movements: Extraocular movements intact.     Conjunctiva/sclera: Conjunctivae normal.     Pupils: Pupils are equal, round, and reactive to light.    Cardiovascular:     Rate and Rhythm: Normal rate and regular rhythm.     Pulses: Normal pulses.     Heart sounds: Normal heart sounds.  Pulmonary:     Effort: No respiratory distress.     Breath sounds: No stridor. Wheezing (expiratory) and rhonchi present.     Comments: Respiratory rate mildly labored Abdominal:     Palpations: Abdomen is soft.   Musculoskeletal:     Cervical back: Normal range of motion.  Skin:    General: Skin is warm and dry.   Neurological:     General: No focal deficit present.     Mental Status: She is alert and oriented to person, place, and time.   Psychiatric:        Mood and Affect: Mood normal.        Behavior: Behavior normal.      UC Treatments / Results  Labs (all labs ordered are listed, but only abnormal results are displayed) Labs Reviewed - No data to display  EKG   Radiology No results found.  Procedures Procedures  (including critical care time)  Medications Ordered in UC Medications  ipratropium-albuterol  (DUONEB) 0.5-2.5 (3) MG/3ML nebulizer solution 3 mL (3 mLs Nebulization Given 08/31/23 0851)    Initial Impression / Assessment and Plan / UC Course  I have reviewed the triage vital signs and the nursing notes.  Pertinent labs & imaging results that were available during my care of the patient were reviewed by me and considered in my medical decision making (see chart for details).  The patient presents for complaints of shortness of breath, nausea, vomiting, weakness, and problems with her feeding tube.  On initial presentation, patient appears ill, baseline O2 sats at 88%.  DuoNeb was administered, post DuoNeb, sats only improved to 91%.  Patient with several comorbidities and chronic conditions.  Given the patient's current presentation, vital signs, and symptoms, feel condition warrants a higher level of care.  Daughter was advised of same.  Daughter was advised that due to patient's vital signs, it is recommended that patient be transported to [facility] emergency department as this is the closest ER.  Daughter declined stating that she will take her mother to [facility].  Again reiterated patient's vital signs were unstable, daughter continues to decline transport to [facility].  Daughter verbalized she will be transporting patient to the emergency department via private vehicle.  Patient discharged to the emergency department.   Final Clinical Impressions(s) / UC Diagnoses   Final diagnoses:  None     Discharge Instructions      Go to the emergency department for further evaluation.      ED Prescriptions   None    PDMP not reviewed this encounter.   Hardy Lia, NP 08/31/23 1126

## 2023-08-31 NOTE — ED Notes (Signed)
 Awaiting 1815 verification for meds

## 2023-08-31 NOTE — H&P (Signed)
 History and Physical    Patient: Darlene [name] ZOX:[id number] DOB: Apr 30, 1940 DOA: 08/31/2023 DOS: the patient was seen and examined on 08/31/2023 PCP: Jenelle Mis, FNP  Patient coming from: Home  Chief Complaint:  Chief Complaint  Patient presents with   Shortness of Breath    HPI: Darlene [name] is a 83 y.o. female with history of with a history of HTN/HLD, GERD, OSA noncompliant with CPAP, A-fib on Eliquis , paraesophageal hernia status post repair 2 months ago, G-tube placement (not in use, scheduled for removal later this month), presenting with worsening shortness of breath, cough, nausea, weakness.  Patient reported that she may have had the flu a couple weeks ago.  Apparently recovered from that however couple days ago began having worsening shortness of breath, cough, nausea, vomiting.  Had presented to urgent care earlier today with instructions to go to the emergency department after evaluations.  Admits that multiple members of her family have had a cold-like illness recently as well.  Patient admits to coarse cough however not productive of any sputum.  Admits to coughing so much that she would vomit.  Denies any fever, purulent sputum, hematemesis, hemoptysis, chest pain, abdominal pain, diarrhea, lower extremity swelling.  No recent change in medications.  Patient has a G-tube but does not use it.  Upon evaluation emergency department, patient is tachypneic, dyspneic, O2 sats in the low 90s on room air.  WBCs 18.5, lactate 2.8, potassium 3.2.  Chest CT noting bilateral lower lobe consolidation suggesting pneumonia.   Review of Systems: As mentioned in the history of present illness. All other systems reviewed and are negative. Past Medical History:  Diagnosis Date   Anxiety    Chronic lower back pain    Depression    DJD (degenerative joint disease)    knees, neck   GERD (gastroesophageal reflux disease)    has had dilitation in the past   Hypertension    Past  Surgical History:  Procedure Laterality Date   ABDOMINAL HYSTERECTOMY  03/1979   BREAST BIOPSY Left 1986 and 1987   CATARACT EXTRACTION, BILATERAL Bilateral 2014   Dr. Michaeleen Adler, in [place]   ESOPHAGOGASTRODUODENOSCOPY N/A 07/01/2023   Procedure: EGD (ESOPHAGOGASTRODUODENOSCOPY);  Surgeon: Daina Drum, MD;  Location: [facility] ENDOSCOPY;  Service: Gastroenterology;  Laterality: N/A;  Sedation provided by ICU   [facility]   INSERTION, GASTROSTOMY TUBE, ROBOT-ASSISTED Left 07/21/2023   Procedure: INSERTION, GASTROSTOMY TUBE, ROBOT-ASSISTED;  Surgeon: Adalberto Acton, MD;  Location: [facility] OR;  Service: General;  Laterality: Left;   XI ROBOTIC ASSISTED PARAESOPHAGEAL HERNIA REPAIR N/A 07/21/2023   Procedure: REPAIR, HERNIA, PARAESOPHAGEAL, ROBOT-ASSISTED;  Surgeon: Adalberto Acton, MD;  Location: [facility] OR;  Service: General;  Laterality: N/A;  ROBOTIC PARAESOPHAGEAL HERNIA REPAIR, ROBOTIC REPAIR OF SPIGELIAN HERNIA   Social History:  reports that she has never smoked. She has never used smokeless tobacco. She reports current alcohol use of about 3.0 standard drinks of alcohol per week. She reports that she does not use drugs.  Allergies  Allergen Reactions   Cephalosporins Rash    Tolerated ceftriaxone  06/2023   Keflex [Cephalexin] Rash    Family History  Problem Relation Age of Onset   Hypertension Mother    Heart disease Mother    Hypertension Father    Heart disease Father    Tremor Sister    Dementia Brother    Hypertension Daughter    Colon cancer  Neg Hx    Esophageal cancer Neg Hx    Liver disease Neg Hx     Prior to Admission medications   Medication Sig Start Date End Date Taking? Authorizing Provider  acetaminophen  (TYLENOL ) 500 MG tablet Take 500 mg by mouth every 6 (six) hours as needed for mild pain (pain score 1-3).    [provider]  albuterol  (VENTOLIN  HFA) 108 (90 Base) MCG/ACT inhaler Inhale 2 puffs into the lungs every  6 (six) hours as needed for wheezing or shortness of breath. 05/22/22   Cranford, Tonya, NP  amiodarone  (PACERONE ) 200 MG tablet Take 1 tablet (200 mg total) by mouth daily. 07/30/23   Kraig Peru, MD  apixaban  (ELIQUIS ) 5 MG TABS tablet TAKE 1 TABLET BY MOUTH TWICE  DAILY 06/07/23   Laurann Pollock, MD  Ascorbic Acid (VITAMIN C) 500 MG CAPS Take 500 mg by mouth.     [provider]  bisacodyl  (DULCOLAX) 10 MG suppository Place 1 suppository (10 mg total) rectally daily as needed for moderate constipation. Patient not taking: Reported on 08/17/2023 07/29/23   Kraig Peru, MD  busPIRone  (BUSPAR ) 5 MG tablet Take 1 tablet (5 mg total) by mouth 3 (three) times daily. 07/29/23   Patel, Pranav M, MD  CHOLECALCIFEROL PO Take 5,000 Units by mouth daily.    [provider]  Cyanocobalamin  2500 MCG TABS Take 2,500 mcg by mouth once a week.    [provider]  diltiazem  (CARDIZEM  CD) 180 MG 24 hr capsule Take 1 capsule (180 mg total) by mouth daily. 08/17/23   Amadeo June, MD  docusate (COLACE) 50 MG/5ML liquid Take 10 mLs (100 mg total) by mouth 2 (two) times daily. 07/29/23   Kraig Peru, MD  escitalopram  (LEXAPRO ) 20 MG tablet TAKE 1 TABLET BY MOUTH DAILY FOR MOOD, ANXIETY &amp; IRRITABILITY 10/10/22   Wilkinson, Dana E, FNP  feeding supplement (ENSURE ENLIVE / ENSURE PLUS) LIQD Take 237 mLs by mouth in the morning and at bedtime. 07/29/23   Kraig Peru, MD  ferrous sulfate  325 (65 FE) MG tablet Take 1 tablet (325 mg total) by mouth every other day. 01/15/20   Lakota Bureau, NP  furosemide  (LASIX ) 20 MG tablet Take 1 tablet (20 mg total) by mouth daily. 07/29/23 07/28/24  Kraig Peru, MD  lidocaine  (LIDODERM ) 5 % Place 1 patch onto the skin daily. Remove & Discard patch within 12 hours or as directed by MD Patient not taking: Reported on 08/17/2023 07/30/23   Kraig Peru, MD  metoCLOPramide  (REGLAN ) 10 MG tablet Take 1 tablet (10 mg total) by mouth 4 (four) times  daily -  before meals and at bedtime. 07/29/23   Kraig Peru, MD  metoprolol  succinate (TOPROL  XL) 25 MG 24 hr tablet Take 0.5 tablets (12.5 mg total) by mouth daily. 07/29/23 07/28/24  Kraig Peru, MD  nitroGLYCERIN  (NITROSTAT ) 0.4 MG SL tablet Place 1 tablet (0.4 mg total) under the tongue every 5 (five) minutes as needed for chest pain. 02/05/20   Strader, Dimple Francis, PA-C  pantoprazole  (PROTONIX ) 40 MG tablet Take 1 tablet (40 mg total) by mouth 2 (two) times daily. Take  1 tablet  Daily  to prevent  Indigestion & Acid Reflux 06/28/23   Myrl Askew, MD  Probiotic Product (PROBIOTIC DAILY PO) Take 1 tablet by mouth daily.    [provider]  traMADol  (ULTRAM ) 50 MG tablet Take 1 tablet (50 mg total) by mouth every 6 (  six) hours as needed for severe pain (pain score 7-10). 07/29/23   Kraig Peru, MD  Water  For Irrigation, Sterile (FREE WATER ) SOLN Place 100 mLs into feeding tube every 4 (four) hours. Patient not taking: Reported on 08/17/2023 07/29/23   Kraig Peru, MD    Physical Exam:  Vitals:   08/31/23 1445 08/31/23 1530 08/31/23 1645 08/31/23 1715  BP: 104/61 113/76 (!) 116/55 98/60  Pulse: 79 96 90 90  Resp: (!) 30 (!) 30 (!) 26 (!) 31  Temp:      TempSrc:      SpO2: 96% 95% 96% 96%  Weight:      Height:        GENERAL:  Alert, pleasant, mild acute distress  HEENT:  EOMI CARDIOVASCULAR: Irregularly irregular RESPIRATORY: Tachypneic, dyspneic, coarse cough, bilateral rhonchi GASTROINTESTINAL:  Soft, nontender, nondistended, G-tube present EXTREMITIES:  No LE edema bilaterally NEURO:  No new focal deficits appreciated SKIN:  No rashes noted PSYCH:  Appropriate mood and affect, anxious   Data Reviewed:  EXAM: CT CHEST, ABDOMEN, AND PELVIS WITH CONTRAST   TECHNIQUE: Multidetector CT imaging of the chest, abdomen and pelvis was performed following the standard protocol during bolus administration of intravenous contrast.   RADIATION DOSE REDUCTION:  This exam was performed according to the departmental dose-optimization program which includes automated exposure control, adjustment of the mA and/or kV according to patient size and/or use of iterative reconstruction technique.   CONTRAST:  75mL OMNIPAQUE  IOHEXOL  350 MG/ML SOLN   COMPARISON:  Chest radiograph 08/31/2023 and 07/13/2023. Chest CTA 06/30/2023. Abdominopelvic CTA 06/28/2023 and CT 05/31/2023.   FINDINGS: CT CHEST FINDINGS   Cardiovascular: No acute vascular findings or significant atherosclerosis. The heart remains mildly enlarged. No significant pericardial fluid.   Mediastinum/Nodes: Mildly prominent mediastinal and hilar lymph nodes are noted, including a subcarinal node measuring 1.4 cm on image 37/3. These were largely obscured on the most recent prior chest CTA atelectasis and pleural effusions. Patient has undergone paraesophageal hernia repair, and the esophagus is no longer fluid-filled or significantly distended. Endotracheal tube has been removed. Stable mild heterogeneity of the thyroid  gland for which no specific follow-up imaging is recommended given the patient's age.   Lungs/Pleura: Minimal residual right pleural effusion, improved from previous chest CT. There is improved aeration of both lung bases with residual or recurrent patchy airspace disease in both lower lobes. Mild dependent atelectasis in both upper lobes.   Musculoskeletal/Chest wall: No chest wall mass or suspicious osseous findings. Mild spondylosis.   CT ABDOMEN AND PELVIS FINDINGS   Hepatobiliary: Scattered low-density hepatic cysts are similar to prior imaging. No suspicious liver lesions are identified. There is probable mild chronic intrahepatic biliary dilatation within the left lobe. There are small nitrogen containing gallstones. No evidence of gallbladder wall thickening or surrounding inflammation. Stable mild chronic extrahepatic biliary dilatation with the  common hepatic duct measuring 14 mm in diameter. No evidence of choledocholithiasis.   Pancreas: Mild pancreatic atrophy. No evidence of pancreatic ductal dilatation or surrounding inflammation.   Spleen: Normal in size without focal abnormality.   Adrenals/Urinary Tract: Both adrenal glands appear normal. Unchanged fat density lesion projecting laterally from the lower pole of the right kidney, measuring 4.1 x 2.7 cm on image 85/3, most consistent with an angiomyolipoma. No other evidence of renal mass. No evidence of urinary tract calculus or hydronephrosis. The bladder appears normal for its degree of distention.   Stomach/Bowel: No enteric contrast administered. As above, findings of previous paraesophageal  hernia repair with improved wall thickening of the distal esophagus. Percutaneous G-tube in place. No evidence of bowel distension, wall thickening or surrounding inflammation. No residual bowel herniation within the right lower quadrant spigelian hernia. Mildly prominent colonic stool, especially within the rectum.   Vascular/Lymphatic: There are no enlarged abdominal or pelvic lymph nodes. Aortic and branch vessel atherosclerosis without evidence of significant aneurysm or large vessel occlusion. Stable chronic calcified 9 mm splenic artery aneurysm on image 73/3.   Reproductive: Status post hysterectomy. No evidence of adnexal mass.   Other: No evidence of abdominal wall mass or hernia. No ascites or pneumoperitoneum.   Musculoskeletal: No acute or significant osseous findings. Multilevel spondylosis associated with a convex right thoracolumbar scoliosis.   IMPRESSION: 1. Improved aeration of both lung bases with residual or recurrent patchy airspace disease in both lower lobes compared with previous chest CT from 2 months ago. Findings could reflect recurrent pneumonia. Radiographic follow up recommended. Minimal residual right pleural effusion. 2. Mildly  prominent mediastinal and hilar lymph nodes, nonspecific, but likely reactive. 3. Interval paraesophageal hernia repair with improved wall thickening of the distal esophagus. 4. No evidence of bowel obstruction, perforation or other acute findings in the abdomen. Mildly prominent colonic stool, especially within the rectum. 5. Cholelithiasis without evidence of acute cholecystitis. Stable mild chronic extrahepatic biliary dilatation and probable mild chronic intrahepatic biliary dilatation within the left lobe. 6. Stable fat density lesion projecting laterally from the lower pole of the right kidney, most consistent with an angiomyolipoma. 7.  Aortic Atherosclerosis (ICD10-I70.0).  Assessment and Plan:  Concern for sepsis - Leukocytosis, tachypnea, lactate 2.8, no pneumonia given concern for sepsis.  Blood cultures, empiric IV fluid bolus, empiric antibiotics on board.  Monitor blood pressure closely.  Trend lactate.  Community-acquired pneumonia - Chest x-ray personally reviewed noting bilateral posterior lower lobe consolidation.  Bilateral lower lobe consolidation noted on CT.  Coarse cough and dyspnea.  Rapid COVID/influenza/RSV negative.  Given recent reported influenza, will order MRSA nasal swab.  Order pathogen profile, sputum, strep pneumo/Legionella urine antigens.  Begin empiric ceftriaxone  2 g every 24 hours as well as azithromycin  500 mg every 24 hours.  Scheduled DuoNeb every 4 hours.  Supplemental O2 as needed.  Monitor respiratory function closely.  Will recheck CBC to trend leukocytosis in AM.  Permanent atrial fibrillation - Resume patient's home Eliquis .  Continue on telemetry for now.  No RVR.  Will resume patient's home rate medications including amiodarone , Cardizem , metoprolol  in a.m.  Paraesophageal hernia s/p repair 06/2023 - History of GI bleed with aspiration previous hospitalization, prolonged ICU stay and subsequent transition to [facility].  Discharged from [facility] at the  end of May.  PPI on board.  Tube in place however is not being used at this point.  Obstructive sleep apnea - Will order CPAP at night.  Patient noncompliant at home.  Mild hypokalemia - Replenishment initiated.  Will recheck BMP and magnesium  in AM.  Hypertension/hyperlipidemia - Will resume home medication regiment.  Physical debilitation muscle weakness - Patient discharged from STR at the end of May.  Will order PT/OT.  Goals of care - Had frank discussion about patient's goals of care while in the emergency department.  Patient is full code.    Advance Care Planning:   Code Status: Full Code   Consults: None  Family Communication: None at bedside  Severity of Illness: The appropriate patient status for this patient is INPATIENT. Inpatient status is judged to be reasonable and necessary in  order to provide the required intensity of service to ensure the patient's safety. The patient's presenting symptoms, physical exam findings, and initial radiographic and laboratory data in the context of their chronic comorbidities is felt to place them at high risk for further clinical deterioration. Furthermore, it is not anticipated that the patient will be medically stable for discharge from the hospital within 2 midnights of admission.   * I certify that at the point of admission it is my clinical judgment that the patient will require inpatient hospital care spanning beyond 2 midnights from the point of admission due to high intensity of service, high risk for further deterioration and high frequency of surveillance required.*  Author: Jodeane Mulligan, DO 08/31/2023 6:03 PM  For on call review [email or web address].

## 2023-08-31 NOTE — ED Provider Triage Note (Signed)
 Emergency Medicine Provider Triage Evaluation Note  Darlene [name] , a 83 y.o. female  was evaluated in triage.  Pt complains of cough, shortness of breath.  History notable for recent hospitalization for aspiration pneumonia including ICU level of care.  She now presents with persistent possibly worsening cough.  Review of Systems  Positive: Cough, congestion Negative: Persistent chest pain  Physical Exam  BP (!) 100/55 (BP Location: Right Arm)   Pulse (!) 120   Temp 98.4 F (36.9 C) (Oral)   Resp (!) 22   Ht 5' 10 (1.778 m)   Wt 95.3 kg   SpO2 92%   BMI 30.13 kg/m  Gen:   Awake, no distress speaking clearly Resp:  Normal effort active cough MSK:   Moves extremities without difficulty no obvious deformity  Medical Decision Making  Medically screening exam initiated at 10:57 AM.  Appropriate orders placed.  Darlene [name] was informed that the remainder of the evaluation will be completed by another provider, this initial triage assessment does not replace that evaluation, and the importance of remaining in the ED until their evaluation is complete.   Darlene Gandy, MD 08/31/23 1058

## 2023-08-31 NOTE — ED Notes (Signed)
 Awaiting 1815 medication verification

## 2023-08-31 NOTE — Progress Notes (Signed)
 Pt has arrived to unit, assessed, informed of POC, all needs addressed

## 2023-08-31 NOTE — Progress Notes (Signed)
 Patient states she doesn't wear CPAP and doesn't wish to wear CPAP during her time in the hospital.   08/31/23 2056  BiPAP/CPAP/SIPAP  Reason BIPAP/CPAP not in use Non-compliant

## 2023-08-31 NOTE — ED Provider Notes (Signed)
 [place] [facility] Provider Note   CSN: [id number] Arrival date & time: 08/31/23  9062    Patient presents with: Shortness of Breath  HPI Darlene [name] is a 83 y.o. 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  Shortness of breath and cough started about 2 weeks ago.  She was diagnosed with flu at that time.  The cough has been persistent more productive in the last few days.  Also endorsing a subjective fever.  She states that yesterday she vomited a couple of times as well.  She does deny abdominal pain, diarrhea.  Denies urinary symptoms.  She denies any abnormal discharge from the G-tube or any concerns for infection around the G-tube insertion site.  Also denies chest pain.    Shortness of Breath      Prior to Admission medications   Medication Sig Start Date End Date Taking? Authorizing Provider  acetaminophen  (TYLENOL ) 500 MG tablet Take 500 mg by mouth every 6 (six) hours as needed for mild pain (pain score 1-3).    [provider]  albuterol  (VENTOLIN  HFA) 108 (90 Base) MCG/ACT inhaler Inhale 2 puffs into the lungs every 6 (six) hours as needed for wheezing or shortness of breath. 05/22/22   Cranford, Tonya, NP  amiodarone  (PACERONE ) 200 MG tablet Take 1 tablet (200 mg total) by mouth daily. 07/30/23   Tobie Yetta CHRISTELLA, MD  apixaban  (ELIQUIS ) 5 MG TABS tablet TAKE 1 TABLET BY MOUTH TWICE  DAILY 06/07/23   Alvan Dorn FALCON, MD  Ascorbic Acid (VITAMIN C) 500 MG CAPS Take 500 mg by mouth.     [provider]  bisacodyl  (DULCOLAX) 10 MG suppository Place 1 suppository (10 mg total) rectally daily as needed for moderate constipation. Patient not taking: Reported on 08/17/2023  07/29/23   Tobie Yetta CHRISTELLA, MD  busPIRone  (BUSPAR ) 5 MG tablet Take 1 tablet (5 mg total) by mouth 3 (three) times daily. 07/29/23   Patel, Pranav M, MD  CHOLECALCIFEROL PO Take 5,000 Units by mouth daily.    [provider]  Cyanocobalamin  2500 MCG TABS Take 2,500 mcg by mouth once a week.    [provider]  diltiazem  (CARDIZEM  CD) 180 MG 24 hr capsule Take 1 capsule (180 mg total) by mouth daily. 08/17/23   Aletha Bene, MD  docusate (COLACE) 50 MG/5ML liquid Take 10 mLs (100 mg total) by mouth 2 (two) times daily. 07/29/23   Tobie Yetta CHRISTELLA, MD  escitalopram  (LEXAPRO ) 20 MG tablet TAKE 1 TABLET BY MOUTH DAILY FOR MOOD, ANXIETY &amp; IRRITABILITY 10/10/22   Wilkinson, Dana E, FNP  feeding supplement (ENSURE ENLIVE / ENSURE PLUS) LIQD Take 237 mLs by mouth in the morning and at bedtime. 07/29/23   Tobie Yetta CHRISTELLA, MD  ferrous sulfate  325 (65 FE) MG tablet Take 1 tablet (325 mg total) by mouth every other day. 01/15/20   Jeanine Knee, NP  furosemide  (LASIX ) 20 MG tablet Take 1 tablet (20 mg total) by mouth daily. 07/29/23 07/28/24  Tobie Yetta CHRISTELLA, MD  lidocaine  (LIDODERM ) 5 % Place 1 patch onto the skin daily. Remove & Discard patch within 12 hours or as directed by MD Patient not taking: Reported on 08/17/2023 07/30/23   Tobie Yetta CHRISTELLA, MD  metoCLOPramide  (REGLAN ) 10 MG tablet  Take 1 tablet (10 mg total) by mouth 4 (four) times daily -  before meals and at bedtime. 07/29/23   Tobie Yetta HERO, MD  metoprolol  succinate (TOPROL  XL) 25 MG 24 hr tablet Take 0.5 tablets (12.5 mg total) by mouth daily. 07/29/23 07/28/24  Tobie Yetta HERO, MD  nitroGLYCERIN  (NITROSTAT ) 0.4 MG SL tablet Place 1 tablet (0.4 mg total) under the tongue every 5 (five) minutes as needed for chest pain. 02/05/20   Strader, Laymon HERO, PA-C  pantoprazole  (PROTONIX ) 40 MG tablet Take 1 tablet (40 mg total) by mouth 2 (two) times daily. Take  1 tablet  Daily  to prevent  Indigestion & Acid Reflux 06/28/23   Dena Charleston,  MD  Probiotic Product (PROBIOTIC DAILY PO) Take 1 tablet by mouth daily.    [provider]  traMADol  (ULTRAM ) 50 MG tablet Take 1 tablet (50 mg total) by mouth every 6 (six) hours as needed for severe pain (pain score 7-10). 07/29/23   Tobie Yetta HERO, MD  Water  For Irrigation, Sterile (FREE WATER ) SOLN Place 100 mLs into feeding tube every 4 (four) hours. Patient not taking: Reported on 08/17/2023 07/29/23   Tobie Yetta HERO, MD    Allergies: Cephalosporins and Keflex [cephalexin]    Review of Systems  Respiratory:  Positive for shortness of breath.     Updated Vital Signs BP 104/61   Pulse 79   Temp 98.2 F (36.8 C) (Oral)   Resp (!) 30   Ht 5' 10 (1.778 m)   Wt 95.3 kg   SpO2 96%   BMI 30.13 kg/m   Physical Exam Vitals and nursing note reviewed.  HENT:     Head: Normocephalic and atraumatic.     Mouth/Throat:     Mouth: Mucous membranes are moist.   Eyes:     General:        Right eye: No discharge.        Left eye: No discharge.     Conjunctiva/sclera: Conjunctivae normal.    Cardiovascular:     Rate and Rhythm: Normal rate and regular rhythm.     Pulses: Normal pulses.     Heart sounds: Normal heart sounds.  Pulmonary:     Effort: Pulmonary effort is normal.     Breath sounds: Normal breath sounds and air entry. No wheezing or rales.  Abdominal:     General: Abdomen is flat. There is no distension.     Palpations: Abdomen is soft.     Tenderness: There is no abdominal tenderness.   Skin:    General: Skin is warm and dry.   Neurological:     General: No focal deficit present.   Psychiatric:        Mood and Affect: Mood normal.      (all labs ordered are listed, but only abnormal results are displayed) Labs Reviewed  COMPREHENSIVE METABOLIC PANEL WITH GFR - Abnormal; Notable for the following components:      Result Value   Potassium 3.2 (*)    Glucose, Bld 157 (*)    Total Protein 6.3 (*)    Albumin  3.0 (*)    All other components  within normal limits  CBC WITH DIFFERENTIAL/PLATELET - Abnormal; Notable for the following components:   WBC 18.5 (*)    Neutro Abs 16.3 (*)    Monocytes Absolute 1.1 (*)    Abs Immature Granulocytes 0.10 (*)    All other components within normal limits  PROTIME-INR - Abnormal; Notable for  the following components:   Prothrombin Time 23.5 (*)    INR 2.1 (*)    All other components within normal limits  I-STAT CG4 LACTIC ACID, ED - Abnormal; Notable for the following components:   Lactic Acid, Venous 2.2 (*)    All other components within normal limits  SARS CORONAVIRUS 2 BY RT PCR  RESP PANEL BY RT-PCR (RSV, FLU A&B, COVID)  [id number]  CULTURE, BLOOD (ROUTINE X 2)  CULTURE, BLOOD (ROUTINE X 2)  URINALYSIS, W/ REFLEX TO CULTURE (INFECTION SUSPECTED)    EKG: EKG Interpretation Date/Time:  Tuesday August 31 2023 10:15:44 EDT Ventricular Rate:  96 PR Interval:    QRS Duration:  96 QT Interval:  396 QTC Calculation: 500 R Axis:   69  Text Interpretation: Atrial fibrillation Cannot rule out Anterior infarct , age undetermined Abnormal ECG Confirmed by Garrick Charleston [phone]) on 08/31/2023 10:20:12 AM  Radiology: DG Chest 2 View Result Date: 08/31/2023 CLINICAL DATA:  Pneumonia.  Vomiting and nausea.  Feeding tube EXAM: CHEST - 2 VIEW COMPARISON:  Chest x-ray 07/13/2023 FINDINGS: Hyperinflation. Mild opacity left lung base. Possible infiltrate. Increasing linear opacity at the right lung base favoring atelectasis. No pneumothorax or edema. Stable cardiopericardial silhouette. Films are under penetrated. Degenerative changes of the spine. IMPRESSION: Left lung base parenchymal opacity. Possible acute infiltrate or pneumonia. Right basilar more bandlike opacity. Atelectasis is favored. Overall recommend follow-up. Hyperinflation with chronic changes. Electronically Signed   By: Ranell Bring M.D.   On: 08/31/2023 12:38     .Critical Care  Performed by: Lang Norleen POUR, PA-C Authorized by:  Lang Norleen POUR, PA-C   Critical care provider statement:    Critical care time (minutes):  30   Critical care was necessary to treat or prevent imminent or life-threatening deterioration of the following conditions:  Sepsis   Critical care was time spent personally by me on the following activities:  Development of treatment plan with patient or surrogate, discussions with consultants, evaluation of patient's response to treatment, examination of patient, ordering and review of laboratory studies, ordering and review of radiographic studies, ordering and performing treatments and interventions, pulse oximetry, re-evaluation of patient's condition and review of old charts    Medications Ordered in the ED  lactated ringers  infusion (has no administration in time range)  lactated ringers  bolus 1,000 mL (1,000 mLs Intravenous New Bag/Given 08/31/23 1433)  azithromycin  (ZITHROMAX ) 500 mg in sodium chloride  0.9 % 250 mL IVPB (has no administration in time range)  lactated ringers  bolus 500 mL (has no administration in time range)  cefTRIAXone  (ROCEPHIN ) 1 g in sodium chloride  0.9 % 100 mL IVPB (1 g Intravenous New Bag/Given 08/31/23 1441)  iohexol  (OMNIPAQUE ) 350 MG/ML injection 75 mL (75 mLs Intravenous Contrast Given 08/31/23 1514)    Clinical Course as of 08/31/23 1516  Tue Aug 31, 2023  1441 ECG Heart Rate: 95 [JR]    Clinical Course User Index [JR] Lang Norleen POUR, PA-C                                 Medical Decision Making Amount and/or Complexity of Data Reviewed Labs: ordered.  Risk Prescription drug management.   Initial Impression and Ddx 83 year old well-appearing female presenting for cough, shortness of breath nausea and vomiting.  Exam revealed tachycardia and tachypnea.  Initial labs revealed leukocytosis.  DDx includes pneumonia, sepsis, CHF, PE, postop complication including abscess or intra-abdominal infection, electrolyte  derangement, AKI, other. 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 Possible acute infiltrate or pneumonia. Right basilar more bandlike opacity. Atelectasis is favored. CT chest/ab/pelvis is pending  - I personally reviewed and interpreted EKG which revealed atrial fibrillation  Patient Reassessment and Ultimate Disposition/Management On reassessment, patient hemodynamically stable, intermittently tachypneic, sats are 96% on room air, no witnessed vomiting.  Suspect her shortness of breath tachycardia and leukocytosis likely related to pneumonia.  However given her recent abdominal surgery with G-tube placement cannot definitively rule out postop complication or associated infection thus prompting further characterization with CT.  That scan is pending at this time.  Patient management required discussion with the following services or consulting groups:  None  Complexity of Problems Addressed Acute complicated illness or Injury  Additional Data Reviewed and Analyzed Further history obtained from: Past medical history and medications listed in the EMR, Prior ED visit notes, and Recent discharge summary  Patient Encounter Risk Assessment Consideration of hospitalization      Final diagnoses:  SOB (shortness of breath)  Cough, unspecified type  Nausea and vomiting, unspecified vomiting type    ED Discharge Orders     None           Lang Norleen POUR, PA-C 08/31/23 1516    Long, Fonda MATSU, MD 09/10/23 [phone]

## 2023-08-31 NOTE — ED Triage Notes (Signed)
 Pt has had nausea and vomiting since yesterday, is shaky, feeding tube smells different per family and shortness of breath. Cough started a few days ago and has worsened. Pt had flu 2 weeks ago and took tamiflu. Daughter states pt has not had a fever. Pt denies pain, states she just feels sick and weak.

## 2023-08-31 NOTE — Discharge Instructions (Signed)
 Go to the emergency department for further evaluation.

## 2023-09-01 DIAGNOSIS — E876 Hypokalemia: Secondary | ICD-10-CM | POA: Diagnosis not present

## 2023-09-01 DIAGNOSIS — I4811 Longstanding persistent atrial fibrillation: Secondary | ICD-10-CM | POA: Diagnosis not present

## 2023-09-01 DIAGNOSIS — J189 Pneumonia, unspecified organism: Secondary | ICD-10-CM | POA: Diagnosis not present

## 2023-09-01 DIAGNOSIS — A419 Sepsis, unspecified organism: Secondary | ICD-10-CM | POA: Diagnosis not present

## 2023-09-01 LAB — COMPREHENSIVE METABOLIC PANEL WITH GFR
ALT: 24 U/L (ref 0–44)
AST: 16 U/L (ref 15–41)
Albumin: 2.4 g/dL — ABNORMAL LOW (ref 3.5–5.0)
Alkaline Phosphatase: 46 U/L (ref 38–126)
Anion gap: 9 (ref 5–15)
BUN: 11 mg/dL (ref 8–23)
CO2: 26 mmol/L (ref 22–32)
Calcium: 8.5 mg/dL — ABNORMAL LOW (ref 8.9–10.3)
Chloride: 102 mmol/L (ref 98–111)
Creatinine, Ser: 0.55 mg/dL (ref 0.44–1.00)
GFR, Estimated: >60 mL/min (ref >60)
Glucose, Bld: 115 mg/dL — ABNORMAL HIGH (ref 70–99)
Potassium: 3 mmol/L — ABNORMAL LOW (ref 3.5–5.1)
Sodium: 137 mmol/L (ref 135–145)
Total Bilirubin: 1.1 mg/dL (ref 0.0–1.2)
Total Protein: 5.1 g/dL — ABNORMAL LOW (ref 6.5–8.1)

## 2023-09-01 LAB — CBC
HCT: 33.2 % — ABNORMAL LOW (ref 36.0–46.0)
Hemoglobin: 10.7 g/dL — ABNORMAL LOW (ref 12.0–15.0)
MCH: 30.8 pg (ref 26.0–34.0)
MCHC: 32.2 g/dL (ref 30.0–36.0)
MCV: 95.7 fL (ref 80.0–100.0)
Platelets: 198 10*3/uL (ref 150–400)
RBC: 3.47 MIL/uL — ABNORMAL LOW (ref 3.87–5.11)
RDW: 13.7 % (ref 11.5–15.5)
WBC: 7.6 10*3/uL (ref 4.0–10.5)
nRBC: 0 % (ref 0.0–0.2)

## 2023-09-01 LAB — MRSA NEXT GEN BY PCR, NASAL: MRSA by PCR Next Gen: DETECTED — AB

## 2023-09-01 LAB — RESPIRATORY PANEL BY PCR

## 2023-09-01 LAB — MAGNESIUM: Magnesium: 1.3 mg/dL — ABNORMAL LOW (ref 1.7–2.4)

## 2023-09-01 LAB — PROTIME-INR
INR: 1.9 — ABNORMAL HIGH (ref 0.8–1.2)
Prothrombin Time: 21.7 s — ABNORMAL HIGH (ref 11.4–15.2)

## 2023-09-01 LAB — STREP PNEUMONIAE URINARY ANTIGEN: Strep Pneumo Urinary Antigen: POSITIVE — AB

## 2023-09-01 MED ORDER — MAGNESIUM SULFATE 4 GM/100ML IV SOLN
4.0000 g | Freq: Once | INTRAVENOUS | Status: AC
  Administered 2023-09-01: 4 g via INTRAVENOUS
  Filled 2023-09-01: qty 100

## 2023-09-01 MED ORDER — ONDANSETRON HCL 4 MG/2ML IJ SOLN
4.0000 mg | Freq: Four times a day (QID) | INTRAMUSCULAR | Status: DC | PRN
  Administered 2023-09-01: 4 mg via INTRAVENOUS
  Filled 2023-09-01: qty 2

## 2023-09-01 MED ORDER — MUPIROCIN 2 % EX OINT
1.0000 | TOPICAL_OINTMENT | Freq: Two times a day (BID) | CUTANEOUS | Status: DC
  Administered 2023-09-01 – 2023-09-03 (×6): 1 via NASAL
  Filled 2023-09-01 (×3): qty 22

## 2023-09-01 MED ORDER — CHLORHEXIDINE GLUCONATE CLOTH 2 % EX PADS
6.0000 | MEDICATED_PAD | Freq: Every day | CUTANEOUS | Status: DC
  Administered 2023-09-01 – 2023-09-03 (×3): 6 via TOPICAL

## 2023-09-01 MED ORDER — POTASSIUM CHLORIDE CRYS ER 20 MEQ PO TBCR
40.0000 meq | EXTENDED_RELEASE_TABLET | Freq: Three times a day (TID) | ORAL | Status: AC
  Administered 2023-09-01 (×2): 40 meq via ORAL
  Filled 2023-09-01 (×2): qty 2

## 2023-09-01 NOTE — TOC Initial Note (Signed)
 Transition of Care (TOC) - Initial/Assessment Note   Spoke to patient at bedside. Patient from home with daughter.   Patient has a cane, walker, and shower chair at home.   Confirmed with Loetta Ringer prior to admission patient was active with [facility] for HHRN,PT,OT , patient wants to continue. Will need orders and face to face   Patient does have a PEG. At home she was not using PEG and plan was to remove PEG 09/09/23.       Transition of Care Department [facility]) has reviewed patient and  will continue to monitor patient advancement through interdisciplinary progression rounds. If new patient transition needs arise, please place a TOC consult.   Patient Details  Name: Darlene [name] MRN: [id number] Date of Birth: [birth year]  Transition of Care [facility]) CM/SW Contact:    Terre Ferri, RN Phone Number: 09/01/2023, 12:14 PM  Clinical Narrative:                   Expected Discharge Plan: Home w Home Health Services Barriers to Discharge: Continued Medical Work up   Patient Goals and CMS Choice Patient states their goals for this hospitalization and ongoing recovery are:: to return to home CMS Medicare.gov Compare Post Acute Care list provided to:: Patient Choice offered to / list presented to : Patient      Expected Discharge Plan and Services   Discharge Planning Services: CM Consult Post Acute Care Choice: Home Health Living arrangements for the past 2 months: Single Family Home                 DME Arranged: N/A DME Agency: NA       HH Arranged: PT, OT, RN HH Agency: [facility] Date HH Agency Contacted: 09/01/23 Time HH Agency Contacted: 1213 Representative spoke with at [facility] Agency: Confirmed with Loetta Ringer prior to admission patient was active with [facility] for HHRN,PT,OT , patient wants to continue. Will need orders and face to face  Prior Living Arrangements/Services Living arrangements for the past 2 months: Single Family Home Lives with:: Adult  Children Patient language and need for interpreter reviewed:: Yes Do you feel safe going back to the place where you live?: Yes      Need for Family Participation in Patient Care: Yes (Comment) Care giver support system in place?: Yes (comment) Current home services: DME Criminal Activity/Legal Involvement Pertinent to Current Situation/Hospitalization: No - Comment as needed  Activities of Daily Living   ADL Screening (condition at time of admission) Independently performs ADLs?: Yes (appropriate for developmental age) Is the patient deaf or have difficulty hearing?: No Does the patient have difficulty seeing, even when wearing glasses/contacts?: No Does the patient have difficulty concentrating, remembering, or making decisions?: No  Permission Sought/Granted   Permission granted to share information with : Yes, Verbal Permission Granted     Permission granted to share info w AGENCY: [facility]        Emotional Assessment Appearance:: Appears stated age Attitude/Demeanor/Rapport: Engaged Affect (typically observed): Appropriate Orientation: : Oriented to Self, Oriented to Place, Oriented to  Time, Oriented to Situation Alcohol / Substance Use: Not Applicable Psych Involvement: No (comment)  Admission diagnosis:  SOB (shortness of breath) [R06.02] Community acquired pneumonia [J18.9] Nausea and vomiting, unspecified vomiting type [R11.2] Cough, unspecified type [R05.9] Patient Active Problem List   Diagnosis Date Noted   Sepsis due to pneumonia (HCC) 08/31/2023   Atrial fibrillation (HCC) 08/31/2023   Obstructive sleep apnea 08/31/2023   Weakness 08/31/2023  SOB (shortness of breath) 08/31/2023   Anxiety 08/17/2023   Hiatal hernia 07/14/2023   Acute pulmonary edema (HCC) 07/14/2023   Aspiration into airway 07/06/2023   Acute respiratory failure with hypoxia (HCC) 06/30/2023   Acute GI bleeding 06/29/2023   Encounter to establish care with new provider 06/15/2023    Class 1 obesity with serious comorbidity and body mass index (BMI) of 32.0 to 32.9 in adult 06/15/2023   Physical deconditioning 01/15/2020   At moderate risk for fall 01/15/2020   Pulmonary hypertension (HCC) 01/12/2020   Central sleep apnea associated with atrial fibrillation (HCC) 06/26/2019   Severe obstructive sleep apnea-hypopnea syndrome 05/31/2019   Hypoxemia requiring supplemental oxygen 05/09/2019   LVH (left ventricular hypertrophy) due to hypertensive disease, without heart failure 05/09/2019   Hypokalemia 04/16/2019   Atrial fibrillation with RVR (HCC) 04/15/2019   Esophageal stricture 09/04/2018   Iron deficiency anemia 09/04/2018   Vitamin D  deficiency 02/16/2018   Hyperlipidemia, mixed 10/01/2017   History of colon polyps 09/29/2017   GERD (gastroesophageal reflux disease) 04/01/2017   DJD (degenerative joint disease)    Chronic lower back pain    Depression    PCP:  Jenelle Mis, FNP Pharmacy:   [facility] [address] [place] - [address] [place] [address] Phone: [phone] Fax: [phone]  [facility]) - [place], [place] - [address] [facility] [address] [place] [address] [place] [address] Phone: [phone] Fax: [phone]  [facility] - [place], [place] - [address] [place] [address] Phone: [phone] Fax: [phone]  [facility] - [place], [place] - [address] [facility] [employer] [address] [place] [address] Phone: [phone] Fax: [phone]  [facility] [place], [place] - [address] [place] [address] Phone: [phone] Fax: [phone]  [facility] - [place], [place] - [address] [place] [address] Phone: [phone] Fax: [phone]  [facility] [place], [place] - [address] [place] [address] Phone: [phone] Fax: [phone]     Social Drivers of Health (SDOH) Social History: SDOH Screenings   Food Insecurity: No Food Insecurity (08/31/2023)  Housing: Low Risk  (08/31/2023)  Transportation Needs: No Transportation Needs (08/31/2023)  Utilities: Not At Risk (08/31/2023)  Alcohol Screen: Low Risk  (04/28/2019)  Depression (PHQ2-9): Medium Risk (06/15/2023)  Financial Resource Strain: Low Risk  (02/03/2021)  Physical Activity: Inactive (04/28/2019)  Social Connections: Socially Isolated (08/31/2023)  Stress: No Stress Concern Present (04/28/2019)  Tobacco Use: Low Risk  (08/31/2023)   SDOH Interventions:     Readmission Risk Interventions     No data to display

## 2023-09-01 NOTE — Evaluation (Signed)
 Physical Therapy Evaluation Patient Details Name: Darlene [name] MRN: [id number] DOB: June 22, 1940 Today's Date: 09/01/2023  History of Present Illness  83 yo female admitted 6/17 for PNA, had prolonged ICU stay in April 2025 with GI bleed with aspiration, S/P hernia repair & G-tube placement 07/21/23. PMhx: depression, DJD, OSA, pulmonary HTN, obesity, HLD, Afib  Clinical Impression  Darlene [name] is 83 y.o. female admitted with above HPI and diagnosis. Patient is currently limited by functional impairments below (see PT problem list). Patient lives with daughter and recently returned home from ST rehab and have been mobilizing household distances with RW and sup/assist from daughter. Pt still requires assist for ADL's since return from rehab. Today pt able to complete bed mobility at [facility] level and transfers/gait with RW at [facility] assist to [facility] for safety. No overt LOB noted, no buckling of LE's and pt denied dizziness throughout. EOS pt agreeable to sit up in recliner. Call bell and all needs within reach. Patient will benefit from continued skilled PT interventions to address impairments and progress independence with mobility. Acute PT will follow and progress as able.         If plan is discharge home, recommend the following: A little help with walking and/or transfers;A little help with bathing/dressing/bathroom;Assistance with cooking/housework;Assist for transportation;Help with stairs or ramp for entrance   Can travel by private vehicle        Equipment Recommendations None recommended by PT  Recommendations for Other Services       Functional Status Assessment Patient has had a recent decline in their functional status and demonstrates the ability to make significant improvements in function in a reasonable and predictable amount of time.     Precautions / Restrictions Precautions Precautions: Fall Recall of Precautions/Restrictions: Intact Restrictions Weight Bearing Restrictions  Per Provider Order: No      Mobility  Bed Mobility Overal bed mobility: Needs Assistance Bed Mobility: Supine to Sit     Supine to sit: Contact guard, HOB elevated, Used rails     General bed mobility comments: CGA for safety, reliant on bed features    Transfers Overall transfer level: Needs assistance Equipment used: Rolling walker (2 wheels) Transfers: Sit to/from Stand Sit to Stand: From elevated surface, Contact guard assist           General transfer comment: EOB elevated slightly, CGA for safety with rise and pt demo's reach back to lower    Ambulation/Gait Ambulation/Gait assistance: Contact guard assist Gait Distance (Feet): 40 Feet Assistive device: Rolling walker (2 wheels) Gait Pattern/deviations: Step-through pattern, Decreased stride length Gait velocity: decr     General Gait Details: slow cautious pace, pt maintianed safe position to RW throughout. no overt LOB. pt denied dizziness throughout.  Stairs            Wheelchair Mobility     Tilt Bed    Modified Rankin (Stroke Patients Only)       Balance Overall balance assessment: Needs assistance Sitting-balance support: No upper extremity supported, Feet supported Sitting balance-Leahy Scale: Good     Standing balance support: No upper extremity supported, During functional activity Standing balance-Leahy Scale: Fair Standing balance comment: able to stand unsupported, RW for dynamic stability and safety due to poor activity tolerance                             Pertinent Vitals/Pain Pain Assessment Pain Assessment: No/denies pain (nausea (pre-medicated))  Home Living Family/patient expects to be discharged to:: Private residence Living Arrangements: Children Available Help at Discharge: Family;Available 24 hours/day Type of Home: House Home Access: Level entry       Home Layout: Two level;Able to live on main level with bedroom/bathroom Home Equipment:  Rolling Walker (2 wheels);Cane - single point;Shower seat;Tub bench;Rollator (4 wheels) Additional Comments: Pt lives with daughter who is available 24/7    Prior Function Prior Level of Function : Needs assist             Mobility Comments: supervision with RW/rollator for short distance, w/c for outside ADLs Comments: ind with ADLs 1.5 months ago, lately has needed min A for dressing/bathing, IADLs     Extremity/Trunk Assessment   Upper Extremity Assessment Upper Extremity Assessment: Defer to OT evaluation RUE Deficits / Details: R 2nd digit stiff, not able to flex at all, overall great functional grip with other digits, reports has tremors occasionally RUE Sensation: WNL RUE Coordination: decreased fine motor    Lower Extremity Assessment Lower Extremity Assessment: Generalized weakness    Cervical / Trunk Assessment Cervical / Trunk Assessment: Kyphotic (mild)  Communication   Communication Communication: No apparent difficulties    Cognition Arousal: Alert Behavior During Therapy: WFL for tasks assessed/performed   PT - Cognitive impairments: No apparent impairments                         Following commands: Intact       Cueing Cueing Techniques: Verbal cues     General Comments      Exercises     Assessment/Plan    PT Assessment Patient needs continued PT services  PT Problem List Decreased strength;Decreased activity tolerance;Decreased balance;Decreased mobility;Cardiopulmonary status limiting activity       PT Treatment Interventions DME instruction;Gait training;Stair training;Functional mobility training;Therapeutic activities;Therapeutic exercise;Balance training;Neuromuscular re-education;Cognitive remediation;Patient/family education;Wheelchair mobility training    PT Goals (Current goals can be found in the Care Plan section)  Acute Rehab PT Goals Patient Stated Goal: get home PT Goal Formulation: With patient Time For Goal  Achievement: 09/15/23 Potential to Achieve Goals: Good    Frequency Min 2X/week     Co-evaluation               AM-PAC PT 6 Clicks Mobility  Outcome Measure Help needed turning from your back to your side while in a flat bed without using bedrails?: A Little Help needed moving from lying on your back to sitting on the side of a flat bed without using bedrails?: A Little Help needed moving to and from a bed to a chair (including a wheelchair)?: A Little Help needed standing up from a chair using your arms (e.g., wheelchair or bedside chair)?: A Little Help needed to walk in hospital room?: A Little Help needed climbing 3-5 steps with a railing? : A Lot 6 Click Score: 17    End of Session Equipment Utilized During Treatment: Gait belt Activity Tolerance: Patient tolerated treatment well Patient left: in chair;with call bell/phone within reach Nurse Communication: Mobility status PT Visit Diagnosis: Other abnormalities of gait and mobility (R26.89);Muscle weakness (generalized) (M62.81);Difficulty in walking, not elsewhere classified (R26.2)    Time: 6045-4098 PT Time Calculation (min) (ACUTE ONLY): 20 min   Charges:   PT Evaluation $PT Eval Low Complexity: 1 Low   PT General Charges $$ ACUTE PT VISIT: 1 Visit        Tish Forge, DPT Acute Rehabilitation Services Office  [phone]  09/01/23 4:20 PM

## 2023-09-01 NOTE — Progress Notes (Signed)
 [facility] PROGRESS NOTE   Darlene [name] ZOX:[id number] DOB: 07/23/1940 DOA: 08/31/2023  PCP: Jenelle Mis, FNP  Brief History: 83 y.o. female with history of with a history of HTN/HLD, GERD, OSA noncompliant with CPAP, A-fib on Eliquis , paraesophageal hernia status post repair 2 months ago, G-tube placement (not in use, scheduled for removal later this month), presenting with worsening shortness of breath, cough, nausea, weakness.  Patient was noted to have leukocytosis.  Imaging studies suggested pneumonia.  Patient was hospitalized for further management.     Consultants: None  Procedures: None   Subjective/Interval History: Patient continues to have a cough which is mostly dry but she does feel congested in her chest.  Denies any nausea vomiting currently but did have nausea vomiting a few days ago.  Denies any abdominal pain.   Assessment/Plan:  Community-acquired pneumonia/sepsis, present on admission Patient had leukocytosis tachypnea at the time of admission. Imaging study showed bilateral consolidation. Urine strep pneumo antigen is positive. COVID-19 RSV and influenza PCR were negative. Continue with ceftriaxone  and azithromycin  for now. Respiratory status is stable.  Saturations are normal on room air. Will give Mucinex. Start mobilizing.  Permanent atrial fibrillation Noted to be on amiodarone  metoprolol  and diltiazem .  Anticoagulated with Eliquis .  Paraesophageal hernia s/p repair in April 2025 Has history of GI bleed with aspiration during previous hospitalization.  Had a prolonged ICU stay. Was discharged to short-term rehab and she came home at the the end of May. Continue PPI. She has a G-tube which is currently not in use. Has not had any nausea vomiting in the last 24 hours.  Advance diet to soft.  Obstructive sleep apnea Has been noncompliant with CPAP.   Essential hypertension Continue home medications.  Hypokalemia Will be  supplemented.  Check magnesium  level.  Normocytic anemia No evidence of overt bleeding.  Cholelithiasis Incidentally noted on CT scan.  Asymptomatic.  LFTs are normal.  Obesity Estimated body mass index is 30.13 kg/m as calculated from the following:   Height as of this encounter: 5' 10 (1.778 m).   Weight as of this encounter: 95.3 kg.   DVT Prophylaxis: On apixaban  Code Status: Full code Family Communication: Discussed with her daughter Disposition Plan: To be determined  Status is: Inpatient Remains inpatient appropriate because: Community-acquired pneumonia, sepsis     Medications: Scheduled:  acidophilus  1 capsule Oral Daily   amiodarone   200 mg Oral Daily   apixaban   5 mg Oral BID   busPIRone   5 mg Oral TID   Chlorhexidine  Gluconate Cloth  6 each Topical Daily   diltiazem   180 mg Oral Daily   escitalopram   20 mg Oral Daily   ferrous sulfate   325 mg Oral QODAY   furosemide   20 mg Oral Daily   metoprolol  succinate  12.5 mg Oral Daily   mupirocin ointment  1 Application Nasal BID   pantoprazole   40 mg Oral BID   Continuous:  azithromycin  500 mg (09/01/23 0913)   cefTRIAXone  (ROCEPHIN )  IV     lactated ringers  150 mL/hr at 08/31/23 1554   vancomycin      PRN:albuterol , traMADol   Antibiotics: Anti-infectives (From admission, onward)    Start     Dose/Rate Route Frequency Ordered Stop   09/01/23 1830  vancomycin  (VANCOREADY) IVPB 1750 mg/350 mL        1,750 mg 175 mL/hr over 120 Minutes Intravenous Every 24 hours 08/31/23 1827     09/01/23 1400  cefTRIAXone  (ROCEPHIN ) 2 g in sodium  chloride 0.9 % 100 mL IVPB        2 g 200 mL/hr over 30 Minutes Intravenous Every 24 hours 08/31/23 1802     09/01/23 1000  azithromycin  (ZITHROMAX ) 500 mg in sodium chloride  0.9 % 250 mL IVPB        500 mg 250 mL/hr over 60 Minutes Intravenous Every 24 hours 08/31/23 1802 09/04/23 0959   08/31/23 1815  vancomycin  (VANCOREADY) IVPB 2000 mg/400 mL        2,000 mg 200 mL/hr  over 120 Minutes Intravenous  Once 08/31/23 1805 08/31/23 2045   08/31/23 1415  levofloxacin (LEVAQUIN) IVPB 750 mg  Status:  Discontinued        750 mg 100 mL/hr over 90 Minutes Intravenous  Once 08/31/23 1402 08/31/23 1405   08/31/23 1415  cefTRIAXone  (ROCEPHIN ) 1 g in sodium chloride  0.9 % 100 mL IVPB        1 g 200 mL/hr over 30 Minutes Intravenous  Once 08/31/23 1405 08/31/23 1526   08/31/23 1415  azithromycin  (ZITHROMAX ) 500 mg in sodium chloride  0.9 % 250 mL IVPB        500 mg 250 mL/hr over 60 Minutes Intravenous  Once 08/31/23 1405 08/31/23 1628       Objective:  Vital Signs  Vitals:   08/31/23 2353 09/01/23 0455 09/01/23 0831 09/01/23 0911  BP: 124/60 114/64 (!) 119/58 (!) 119/58  Pulse: (!) 103 (!) 107 99 99  Resp: 18 20 17    Temp: 98.3 F (36.8 C) 97.9 F (36.6 C) 98.4 F (36.9 C)   TempSrc: Oral Oral Oral   SpO2: 92% 91% 92%   Weight:      Height:       No intake or output data in the 24 hours ending 09/01/23 1005 Filed Weights   08/31/23 0954  Weight: 95.3 kg    General appearance: Awake alert.  In no distress Resp: Mildly tachypneic without use of accessory muscles.  Crackles at the bases.  No wheezing or rhonchi. Cardio: S1-S2 is normal regular.  No S3-S4.  No rubs murmurs or bruit GI: Abdomen is soft.  Nontender nondistended.  Bowel sounds are present normal.  No masses organomegaly Extremities: No edema.  Full range of motion of lower extremities. Neurologic: Alert and oriented x3.  No focal neurological deficits.    Lab Results:  Data Reviewed: I have personally reviewed following labs and reports of the imaging studies  CBC: Recent Labs  Lab 08/31/23 1057 09/01/23 0621  WBC 18.5* 7.6  NEUTROABS 16.3*  --   HGB 12.8 10.7*  HCT 40.0 33.2*  MCV 96.9 95.7  PLT 262 198    Basic Metabolic Panel: Recent Labs  Lab 08/31/23 1057 09/01/23 0621  NA 136 137  K 3.2* 3.0*  CL 101 102  CO2 24 26  GLUCOSE 157* 115*  BUN 18 11  CREATININE  0.87 0.55  CALCIUM 9.3 8.5*    GFR: Estimated Creatinine Clearance: 66.6 mL/min (by C-G formula based on SCr of 0.55 mg/dL).  Liver Function Tests: Recent Labs  Lab 08/31/23 1057 09/01/23 0621  AST 23 16  ALT 35 24  ALKPHOS 55 46  BILITOT 1.2 1.1  PROT 6.3* 5.1*  ALBUMIN  3.0* 2.4*    Coagulation Profile: Recent Labs  Lab 08/31/23 1401 09/01/23 0621  INR 2.1* 1.9*     Recent Results (from the past 240 hours)  SARS Coronavirus 2 by RT PCR (hospital order, performed in [facility] hospital lab) *cepheid  single result test* Anterior Nasal Swab     Status: None   Collection Time: 08/31/23 10:58 AM   Specimen: Anterior Nasal Swab  Result Value Ref Range Status   SARS Coronavirus 2 by RT PCR NEGATIVE NEGATIVE Final    Comment: Performed at [facility], [address], [place], [place] [address]  Resp panel by RT-PCR (RSV, Flu A&B, Covid) Anterior Nasal Swab     Status: None   Collection Time: 08/31/23  2:01 PM   Specimen: Anterior Nasal Swab  Result Value Ref Range Status   SARS Coronavirus 2 by RT PCR NEGATIVE NEGATIVE Final   Influenza A by PCR NEGATIVE NEGATIVE Final   Influenza B by PCR NEGATIVE NEGATIVE Final    Comment: (NOTE) The Xpert Xpress SARS-CoV-2/FLU/RSV plus assay is intended as an aid in the diagnosis of influenza from Nasopharyngeal swab specimens and should not be used as a sole basis for treatment. Nasal washings and aspirates are unacceptable for Xpert Xpress SARS-CoV-2/FLU/RSV testing.  Fact Sheet for Patients: BloggerCourse.com  Fact Sheet for Healthcare Providers: SeriousBroker.it  This test is not yet approved or cleared by the [place]  FDA and has been authorized for detection and/or diagnosis of SARS-CoV-2 by FDA under an Emergency Use Authorization (EUA). This EUA will remain in effect (meaning this test can be used) for the duration of the COVID-19 declaration under Section  564(b)(1) of the Act, 21 U.S.C. section 360bbb-3(b)(1), unless the authorization is terminated or revoked.     Resp Syncytial Virus by PCR NEGATIVE NEGATIVE Final    Comment: (NOTE) Fact Sheet for Patients: BloggerCourse.com  Fact Sheet for Healthcare Providers: SeriousBroker.it  This test is not yet approved or cleared by the [place]  FDA and has been authorized for detection and/or diagnosis of SARS-CoV-2 by FDA under an Emergency Use Authorization (EUA). This EUA will remain in effect (meaning this test can be used) for the duration of the COVID-19 declaration under Section 564(b)(1) of the Act, 21 U.S.C. section 360bbb-3(b)(1), unless the authorization is terminated or revoked.  Performed at [facility], [address] [place], [place] [address]   Blood Culture (routine x 2)     Status: None (Preliminary result)   Collection Time: 08/31/23  2:15 PM   Specimen: BLOOD LEFT WRIST  Result Value Ref Range Status   Specimen Description BLOOD LEFT WRIST  Final   Special Requests   Final    BOTTLES DRAWN AEROBIC AND ANAEROBIC Blood Culture adequate volume   Culture   Final    NO GROWTH < 24 HOURS Performed at [facility], [address] [place], [place] [address]    Report Status PENDING  Incomplete  Blood Culture (routine x 2)     Status: None (Preliminary result)   Collection Time: 08/31/23  2:20 PM   Specimen: BLOOD RIGHT FOREARM  Result Value Ref Range Status   Specimen Description BLOOD RIGHT FOREARM  Final   Special Requests   Final    BOTTLES DRAWN AEROBIC AND ANAEROBIC Blood Culture adequate volume   Culture   Final    NO GROWTH < 24 HOURS Performed at [facility], [address] [place], [place] [address]    Report Status PENDING  Incomplete  MRSA Next Gen by PCR, Nasal     Status: Abnormal   Collection Time: 08/31/23 10:29 PM  Result Value Ref Range Status   MRSA by PCR Next Gen  DETECTED (A) NOT DETECTED Final  Comment: RESULT CALLED TO, READ BACK BY AND VERIFIED WITH: SIMON RN 09/01/2023 @ 0202 (NOTE) The GeneXpert MRSA Assay (FDA approved for NASAL specimens only), is one component of a comprehensive MRSA colonization surveillance program. It is not intended to diagnose MRSA infection nor to guide or monitor treatment for MRSA infections. Test performance is not FDA approved in patients less than 34 years old. Performed at [facility], [address] [place], [place] [address]   Respiratory (~20 pathogens) panel by PCR     Status: None   Collection Time: 09/01/23  3:34 AM   Specimen: Urine, Clean Catch; Respiratory  Result Value Ref Range Status   Adenovirus NOT DETECTED NOT DETECTED Final   Coronavirus 229E NOT DETECTED NOT DETECTED Final    Comment: (NOTE) The Coronavirus on the Respiratory Panel, DOES NOT test for the novel  Coronavirus (2019 nCoV)    Coronavirus HKU1 NOT DETECTED NOT DETECTED Final   Coronavirus NL63 NOT DETECTED NOT DETECTED Final   Coronavirus OC43 NOT DETECTED NOT DETECTED Final   Metapneumovirus NOT DETECTED NOT DETECTED Final   Rhinovirus / Enterovirus NOT DETECTED NOT DETECTED Final   Influenza A NOT DETECTED NOT DETECTED Final   Influenza B NOT DETECTED NOT DETECTED Final   Parainfluenza Virus 1 NOT DETECTED NOT DETECTED Final   Parainfluenza Virus 2 NOT DETECTED NOT DETECTED Final   Parainfluenza Virus 3 NOT DETECTED NOT DETECTED Final   Parainfluenza Virus 4 NOT DETECTED NOT DETECTED Final   Respiratory Syncytial Virus NOT DETECTED NOT DETECTED Final   Bordetella pertussis NOT DETECTED NOT DETECTED Final   Bordetella Parapertussis NOT DETECTED NOT DETECTED Final   Chlamydophila pneumoniae NOT DETECTED NOT DETECTED Final   Mycoplasma pneumoniae NOT DETECTED NOT DETECTED Final    Comment: Performed at [facility], [address] [place], [place] [address]      Radiology Studies: CT CHEST ABDOMEN PELVIS  W CONTRAST Result Date: 08/31/2023 CLINICAL DATA:  Sepsis. EXAM: CT CHEST, ABDOMEN, AND PELVIS WITH CONTRAST TECHNIQUE: Multidetector CT imaging of the chest, abdomen and pelvis was performed following the standard protocol during bolus administration of intravenous contrast. RADIATION DOSE REDUCTION: This exam was performed according to the departmental dose-optimization program which includes automated exposure control, adjustment of the mA and/or kV according to patient size and/or use of iterative reconstruction technique. CONTRAST:  75mL OMNIPAQUE  IOHEXOL  350 MG/ML SOLN COMPARISON:  Chest radiograph 08/31/2023 and 07/13/2023. Chest CTA 06/30/2023. Abdominopelvic CTA 06/28/2023 and CT 05/31/2023. FINDINGS: CT CHEST FINDINGS Cardiovascular: No acute vascular findings or significant atherosclerosis. The heart remains mildly enlarged. No significant pericardial fluid. Mediastinum/Nodes: Mildly prominent mediastinal and hilar lymph nodes are noted, including a subcarinal node measuring 1.4 cm on image 37/3. These were largely obscured on the most recent prior chest CTA atelectasis and pleural effusions. Patient has undergone paraesophageal hernia repair, and the esophagus is no longer fluid-filled or significantly distended. Endotracheal tube has been removed. Stable mild heterogeneity of the thyroid  gland for which no specific follow-up imaging is recommended given the patient's age. Lungs/Pleura: Minimal residual right pleural effusion, improved from previous chest CT. There is improved aeration of both lung bases with residual or recurrent patchy airspace disease in both lower lobes. Mild dependent atelectasis in both upper lobes. Musculoskeletal/Chest wall: No chest wall mass or suspicious osseous findings. Mild spondylosis. CT ABDOMEN AND PELVIS FINDINGS Hepatobiliary: Scattered low-density hepatic cysts are similar to prior imaging. No suspicious liver lesions are identified. There is probable mild chronic  intrahepatic biliary dilatation within the left lobe. There are small nitrogen containing gallstones. No evidence of gallbladder wall thickening or surrounding inflammation. Stable mild chronic extrahepatic biliary dilatation with the common hepatic duct measuring 14 mm in diameter. No evidence of choledocholithiasis. Pancreas: Mild pancreatic atrophy. No evidence of pancreatic ductal dilatation or surrounding inflammation. Spleen: Normal in size without focal abnormality. Adrenals/Urinary Tract: Both adrenal glands appear normal. Unchanged fat density lesion projecting laterally from the lower pole of the right kidney, measuring 4.1 x 2.7 cm on image 85/3, most consistent with an angiomyolipoma. No other evidence of renal mass. No evidence of urinary tract calculus or hydronephrosis. The bladder appears normal for its degree of distention. Stomach/Bowel: No enteric contrast administered. As above, findings of previous paraesophageal hernia repair with improved wall thickening of the distal esophagus. Percutaneous G-tube in place. No evidence of bowel distension, wall thickening or surrounding inflammation. No residual bowel herniation within the right lower quadrant spigelian hernia. Mildly prominent colonic stool, especially within the rectum. Vascular/Lymphatic: There are no enlarged abdominal or pelvic lymph nodes. Aortic and branch vessel atherosclerosis without evidence of significant aneurysm or large vessel occlusion. Stable chronic calcified 9 mm splenic artery aneurysm on image 73/3. Reproductive: Status post hysterectomy. No evidence of adnexal mass. Other: No evidence of abdominal wall mass or hernia. No ascites or pneumoperitoneum. Musculoskeletal: No acute or significant osseous findings. Multilevel spondylosis associated with a convex right thoracolumbar scoliosis. IMPRESSION: 1. Improved aeration of both lung bases with residual or recurrent patchy airspace disease in both lower lobes compared with  previous chest CT from 2 months ago. Findings could reflect recurrent pneumonia. Radiographic follow up recommended. Minimal residual right pleural effusion. 2. Mildly prominent mediastinal and hilar lymph nodes, nonspecific, but likely reactive. 3. Interval paraesophageal hernia repair with improved wall thickening of the distal esophagus. 4. No evidence of bowel obstruction, perforation or other acute findings in the abdomen. Mildly prominent colonic stool, especially within the rectum. 5. Cholelithiasis without evidence of acute cholecystitis. Stable mild chronic extrahepatic biliary dilatation and probable mild chronic intrahepatic biliary dilatation within the left lobe. 6. Stable fat density lesion projecting laterally from the lower pole of the right kidney, most consistent with an angiomyolipoma. 7.  Aortic Atherosclerosis (ICD10-I70.0). Electronically Signed   By: Elmon Hagedorn M.D.   On: 08/31/2023 16:06   DG Chest 2 View Result Date: 08/31/2023 CLINICAL DATA:  Pneumonia.  Vomiting and nausea.  Feeding tube EXAM: CHEST - 2 VIEW COMPARISON:  Chest x-ray 07/13/2023 FINDINGS: Hyperinflation. Mild opacity left lung base. Possible infiltrate. Increasing linear opacity at the right lung base favoring atelectasis. No pneumothorax or edema. Stable cardiopericardial silhouette. Films are under penetrated. Degenerative changes of the spine. IMPRESSION: Left lung base parenchymal opacity. Possible acute infiltrate or pneumonia. Right basilar more bandlike opacity. Atelectasis is favored. Overall recommend follow-up. Hyperinflation with chronic changes. Electronically Signed   By: Adrianna Horde M.D.   On: 08/31/2023 12:38       LOS: 1 day   Jamelle Noy  [facility] Pager on [email or web address]  09/01/2023, 10:05 AM

## 2023-09-01 NOTE — Evaluation (Signed)
 Occupational Therapy Evaluation Patient Details Name: Darlene [name] MRN: [id number] DOB: 01/12/1941 Today's Date: 09/01/2023   History of Present Illness   83 yo female admitted 6/17 for PNA, had prolonged ICU stay in April 2025 with GI bleed with aspiration, S/P hernia repair & G-tube placement 07/21/23. PMhx: depression, DJD, OSA, pulmonary HTN, obesity, HLD, Afib     Clinical Impressions Pt feeling better overall, no pain, still coughing. Pt lives at home with daughter who is available 24/7, PLOF mod I with RW/rollator, w/c for community, over the last month Pt has has poorer activity tolerance, ambulating shorter distances, required set up/supervision for dressing/bathing, min A at times. Pt currently at set up/supervision level for ADLs, ambulated 10 feet in room at supervision level with RW, no LOB. Pt had HHOT set up prior to admission, would benefit from [facility] follow up to maximize safety with ADLs and mobility around home, at this time has no further acute OT needs, mobility to follow to improve activity tolerance. No DME needs.      If plan is discharge home, recommend the following:   A little help with walking and/or transfers;A little help with bathing/dressing/bathroom;Assistance with cooking/housework;Assist for transportation     Functional Status Assessment   Patient has had a recent decline in their functional status and demonstrates the ability to make significant improvements in function in a reasonable and predictable amount of time.     Equipment Recommendations   None recommended by OT     Recommendations for Other Services         Precautions/Restrictions   Precautions Precautions: Fall Recall of Precautions/Restrictions: Intact Restrictions Weight Bearing Restrictions Per Provider Order: No     Mobility Bed Mobility Overal bed mobility: Needs Assistance Bed Mobility: Supine to Sit, Sit to Supine     Supine to sit: Min assist Sit to  supine: Supervision   General bed mobility comments: min A to power sit up from flat surface, supervision for return to bed    Transfers Overall transfer level: Needs assistance Equipment used: Rolling walker (2 wheels) Transfers: Sit to/from Stand Sit to Stand: Supervision           General transfer comment: supervision with RW for safety, no LOB      Balance Overall balance assessment: Needs assistance Sitting-balance support: No upper extremity supported, Feet supported Sitting balance-Leahy Scale: Good     Standing balance support: No upper extremity supported, During functional activity Standing balance-Leahy Scale: Fair Standing balance comment: able to stand unsupported, RW for dynamic stability and safety due to poor activity tolerance                           ADL either performed or assessed with clinical judgement   ADL Overall ADL's : Needs assistance/impaired                                       General ADL Comments: set up/supervision for ADLs/mobility with RW, good overall strength, ROM, limited by poor activity tolerance, ambulates short distances.     Vision Baseline Vision/History: 1 Wears glasses Ability to See in Adequate Light: 0 Adequate Patient Visual Report: No change from baseline       Perception         Praxis         Pertinent Vitals/Pain Pain Assessment Pain Assessment:  No/denies pain     Extremity/Trunk Assessment Upper Extremity Assessment Upper Extremity Assessment: Overall WFL for tasks assessed;RUE deficits/detail RUE Deficits / Details: R 2nd digit stiff, not able to flex at all, overall great functional grip with other digits, reports has tremors occasionally RUE Sensation: WNL RUE Coordination: decreased fine motor   Lower Extremity Assessment Lower Extremity Assessment: Defer to PT evaluation       Communication Communication Communication: No apparent difficulties   Cognition  Arousal: Alert Behavior During Therapy: WFL for tasks assessed/performed Cognition: No apparent impairments                               Following commands: Intact       Cueing  General Comments   Cueing Techniques: Verbal cues      Exercises     Shoulder Instructions      Home Living Family/patient expects to be discharged to:: Private residence Living Arrangements: Children Available Help at Discharge: Family;Available 24 hours/day Type of Home: House Home Access: Level entry     Home Layout: Two level;Able to live on main level with bedroom/bathroom     Bathroom Shower/Tub: Chief Strategy Officer: Standard     Home Equipment: Agricultural consultant (2 wheels);Cane - single point;Shower seat;Tub bench;Rollator (4 wheels)   Additional Comments: Pt lives with daughter who is available 24/7      Prior Functioning/Environment Prior Level of Function : Needs assist             Mobility Comments: supervision with RW/rollator for short distance, w/c for outside ADLs Comments: ind with ADLs 1.5 months ago, lately has needed min A for dressing/bathing, IADLs    OT Problem List: Decreased activity tolerance   OT Treatment/Interventions:        OT Goals(Current goals can be found in the care plan section)   Acute Rehab OT Goals Patient Stated Goal: to improve activity tolerance OT Goal Formulation: With patient/family Time For Goal Achievement: 09/15/23 Potential to Achieve Goals: Good   OT Frequency:       Co-evaluation              AM-PAC OT 6 Clicks Daily Activity     Outcome Measure Help from another person eating meals?: None Help from another person taking care of personal grooming?: A Little Help from another person toileting, which includes using toliet, bedpan, or urinal?: A Little Help from another person bathing (including washing, rinsing, drying)?: A Little Help from another person to put on and taking off  regular upper body clothing?: A Little Help from another person to put on and taking off regular lower body clothing?: A Little 6 Click Score: 19   End of Session Equipment Utilized During Treatment: Gait belt;Rolling walker (2 wheels) Nurse Communication: Mobility status  Activity Tolerance: Patient tolerated treatment well Patient left: in bed;with call bell/phone within reach;with bed alarm set  OT Visit Diagnosis: Muscle weakness (generalized) (M62.81);Other (comment) (decreased activity tolerance)                Time: 1235-1300 OT Time Calculation (min): 25 min Charges:  OT General Charges $OT Visit: 1 Visit OT Evaluation $OT Eval Low Complexity: 1 Low OT Treatments $Self Care/Home Management : 8-22 mins  [address], OTR/L   Scherry Curtis 09/01/2023, 1:09 PM

## 2023-09-01 NOTE — Progress Notes (Signed)
 PT Cancellation Note  Patient Details Name: Darlene [name] MRN: [id number] DOB: 08-03-1940   Cancelled Treatment:    Reason Eval/Treat Not Completed: Other (comment) (Pt nauseous with episode of emesis. RN to provide meds, will follow up at later date/time as pt able and schedule allows.)   Corbin Dess PT, DPT Acute Rehabilitation Services Office [phone]  09/01/23 2:52 PM

## 2023-09-01 NOTE — Progress Notes (Signed)
 Subjective: CC: Known to our service. S/p Robotic repair of Type III incarcerated paraesophageal hernia, gastropexy, gastrostomy tube placement, reduction of bowel from incarcerated right lower quadrant hernia, lysis of adhesions x21min Dr. Lanell Pinta 5/7 for incarcerated paraesophageal hernia with gastric outlet obstruction and bleeding cameron ulcers, incarcerated Right lower quadrant hernia containing colon   He was discharged on POD 8 to SNF on D1 diet and tube feeds.    She saw Dr. Lanell Pinta in the office on 5/29. Was okay'd to transition to regular diet but focus on soft, easy to swalllow foods. Tube feeds were cahnged to bolus feeds.   Reports upon discharge from SNF she was on regular diet but still focusing on mechanical soft diet and had been transitioned off tube feeds.  She reports was tolerating this without any dysphagia, nausea, vomiting, abdominal pain or abdominal fullness and was having bowel function.   She reports she recently had the flu a few weeks ago. Over the last few days she has had sob and coughing. Was found to have CAP on admission and is on abx.   We were consulted for n/v. She reports over the last several days she has not been able to tolerate solid food. The food will feel like it gets stuck in her chest and within 10-15 minutes she will have n/v of undigested food. She denies abdominal pain. She is tolerating cld and fld foods without the sensation of food getting stuff or n/v. She continues to pass flatus and have bowel movements with last BM yesterday. No issues with her G-tube. No drainage/leakage from/around the G-tube and no peri-wound skin changes.  She had a CT done on admission w/  Interval paraesophageal hernia repair with improved wall thickening of the distal esophagus and no evidence of bowel obstruction, perforation or other acute findings in the abdomen. She is afebrile with a normal wbc.   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   Objective: Vital signs in last 24 hours: Temp:  [97.9 F (36.6 C)-98.9 F (37.2 C)] 98.4 F (36.9 C) (06/18 0831) Pulse Rate:  [85-107] 99 (06/18 0911) Resp:  [16-34] 17 (06/18 0831) BP: (98-124)/(55-76) 119/58 (06/18 0911) SpO2:  [91 %-96 %] 92 % (06/18 0831) Last BM Date : 08/31/23  Intake/Output from previous day: No intake/output data recorded. Intake/Output this shift: Total I/O In: -  Out: 900 [Urine:900]  PE: Gen:  Alert, NAD, pleasant Abd: Soft, ND, mild RLQ ttp but otherwise NT, incisions healing appropriately, G-tube at 4cm, clamped and without leakage or peri-wound skin changes.   Lab Results:  Recent Labs    08/31/23 1057 09/01/23 0621  WBC 18.5* 7.6  HGB 12.8 10.7*  HCT 40.0 33.2*  PLT 262 198   BMET Recent Labs    08/31/23 1057 09/01/23 0621  NA 136 137  K 3.2* 3.0*  CL 101 102  CO2 24 26  GLUCOSE 157* 115*  BUN 18 11  CREATININE 0.87 0.55  CALCIUM 9.3 8.5*   PT/INR Recent Labs    08/31/23 1401 09/01/23 0621  LABPROT 23.5* 21.7*  INR 2.1* 1.9*   CMP     Component Value Date/Time   NA 137 09/01/2023 0621   K 3.0 (L) 09/01/2023 0621   CL 102 09/01/2023 0621   CO2 26 09/01/2023 0621   GLUCOSE 115 (H) 09/01/2023 2725  BUN 11 09/01/2023 0621   CREATININE 0.55 09/01/2023 0621   CREATININE 0.72 02/16/2023 1045   CALCIUM 8.5 (L) 09/01/2023 0621   PROT 5.1 (L) 09/01/2023 0621   ALBUMIN  2.4 (L) 09/01/2023 0621   AST 16 09/01/2023 0621   ALT 24 09/01/2023 0621   ALKPHOS 46 09/01/2023 0621   BILITOT 1.1 09/01/2023 0621   GFRNONAA >60 09/01/2023 0621   GFRNONAA 75 04/03/2020 1030   GFRAA 87 04/03/2020 1030   Lipase     Component Value Date/Time   LIPASE 24 06/28/2023 1806    Studies/Results: CT CHEST ABDOMEN PELVIS W CONTRAST Result Date: 08/31/2023 CLINICAL DATA:  Sepsis. EXAM: CT CHEST, ABDOMEN, AND PELVIS WITH CONTRAST  TECHNIQUE: Multidetector CT imaging of the chest, abdomen and pelvis was performed following the standard protocol during bolus administration of intravenous contrast. RADIATION DOSE REDUCTION: This exam was performed according to the departmental dose-optimization program which includes automated exposure control, adjustment of the mA and/or kV according to patient size and/or use of iterative reconstruction technique. CONTRAST:  75mL OMNIPAQUE  IOHEXOL  350 MG/ML SOLN COMPARISON:  Chest radiograph 08/31/2023 and 07/13/2023. Chest CTA 06/30/2023. Abdominopelvic CTA 06/28/2023 and CT 05/31/2023. FINDINGS: CT CHEST FINDINGS Cardiovascular: No acute vascular findings or significant atherosclerosis. The heart remains mildly enlarged. No significant pericardial fluid. Mediastinum/Nodes: Mildly prominent mediastinal and hilar lymph nodes are noted, including a subcarinal node measuring 1.4 cm on image 37/3. These were largely obscured on the most recent prior chest CTA atelectasis and pleural effusions. Patient has undergone paraesophageal hernia repair, and the esophagus is no longer fluid-filled or significantly distended. Endotracheal tube has been removed. Stable mild heterogeneity of the thyroid  gland for which no specific follow-up imaging is recommended given the patient's age. Lungs/Pleura: Minimal residual right pleural effusion, improved from previous chest CT. There is improved aeration of both lung bases with residual or recurrent patchy airspace disease in both lower lobes. Mild dependent atelectasis in both upper lobes. Musculoskeletal/Chest wall: No chest wall mass or suspicious osseous findings. Mild spondylosis. CT ABDOMEN AND PELVIS FINDINGS Hepatobiliary: Scattered low-density hepatic cysts are similar to prior imaging. No suspicious liver lesions are identified. There is probable mild chronic intrahepatic biliary dilatation within the left lobe. There are small nitrogen containing gallstones. No  evidence of gallbladder wall thickening or surrounding inflammation. Stable mild chronic extrahepatic biliary dilatation with the common hepatic duct measuring 14 mm in diameter. No evidence of choledocholithiasis. Pancreas: Mild pancreatic atrophy. No evidence of pancreatic ductal dilatation or surrounding inflammation. Spleen: Normal in size without focal abnormality. Adrenals/Urinary Tract: Both adrenal glands appear normal. Unchanged fat density lesion projecting laterally from the lower pole of the right kidney, measuring 4.1 x 2.7 cm on image 85/3, most consistent with an angiomyolipoma. No other evidence of renal mass. No evidence of urinary tract calculus or hydronephrosis. The bladder appears normal for its degree of distention. Stomach/Bowel: No enteric contrast administered. As above, findings of previous paraesophageal hernia repair with improved wall thickening of the distal esophagus. Percutaneous G-tube in place. No evidence of bowel distension, wall thickening or surrounding inflammation. No residual bowel herniation within the right lower quadrant spigelian hernia. Mildly prominent colonic stool, especially within the rectum. Vascular/Lymphatic: There are no enlarged abdominal or pelvic lymph nodes. Aortic and branch vessel atherosclerosis without evidence of significant aneurysm or large vessel occlusion. Stable chronic calcified 9 mm splenic artery aneurysm on image 73/3. Reproductive: Status post hysterectomy. No evidence of adnexal mass. Other: No evidence of abdominal wall mass or hernia. No  ascites or pneumoperitoneum. Musculoskeletal: No acute or significant osseous findings. Multilevel spondylosis associated with a convex right thoracolumbar scoliosis. IMPRESSION: 1. Improved aeration of both lung bases with residual or recurrent patchy airspace disease in both lower lobes compared with previous chest CT from 2 months ago. Findings could reflect recurrent pneumonia. Radiographic follow up  recommended. Minimal residual right pleural effusion. 2. Mildly prominent mediastinal and hilar lymph nodes, nonspecific, but likely reactive. 3. Interval paraesophageal hernia repair with improved wall thickening of the distal esophagus. 4. No evidence of bowel obstruction, perforation or other acute findings in the abdomen. Mildly prominent colonic stool, especially within the rectum. 5. Cholelithiasis without evidence of acute cholecystitis. Stable mild chronic extrahepatic biliary dilatation and probable mild chronic intrahepatic biliary dilatation within the left lobe. 6. Stable fat density lesion projecting laterally from the lower pole of the right kidney, most consistent with an angiomyolipoma. 7.  Aortic Atherosclerosis (ICD10-I70.0). Electronically Signed   By: Elmon Hagedorn M.D.   On: 08/31/2023 16:06   DG Chest 2 View Result Date: 08/31/2023 CLINICAL DATA:  Pneumonia.  Vomiting and nausea.  Feeding tube EXAM: CHEST - 2 VIEW COMPARISON:  Chest x-ray 07/13/2023 FINDINGS: Hyperinflation. Mild opacity left lung base. Possible infiltrate. Increasing linear opacity at the right lung base favoring atelectasis. No pneumothorax or edema. Stable cardiopericardial silhouette. Films are under penetrated. Degenerative changes of the spine. IMPRESSION: Left lung base parenchymal opacity. Possible acute infiltrate or pneumonia. Right basilar more bandlike opacity. Atelectasis is favored. Overall recommend follow-up. Hyperinflation with chronic changes. Electronically Signed   By: Adrianna Horde M.D.   On: 08/31/2023 12:38    Anti-infectives: Anti-infectives (From admission, onward)    Start     Dose/Rate Route Frequency Ordered Stop   09/01/23 1830  vancomycin  (VANCOREADY) IVPB 1750 mg/350 mL        1,750 mg 175 mL/hr over 120 Minutes Intravenous Every 24 hours 08/31/23 1827     09/01/23 1400  cefTRIAXone  (ROCEPHIN ) 2 g in sodium chloride  0.9 % 100 mL IVPB        2 g 200 mL/hr over 30 Minutes Intravenous  Every 24 hours 08/31/23 1802     09/01/23 1000  azithromycin  (ZITHROMAX ) 500 mg in sodium chloride  0.9 % 250 mL IVPB        500 mg 250 mL/hr over 60 Minutes Intravenous Every 24 hours 08/31/23 1802 09/04/23 0959   08/31/23 1815  vancomycin  (VANCOREADY) IVPB 2000 mg/400 mL        2,000 mg 200 mL/hr over 120 Minutes Intravenous  Once 08/31/23 1805 08/31/23 2045   08/31/23 1415  levofloxacin (LEVAQUIN) IVPB 750 mg  Status:  Discontinued        750 mg 100 mL/hr over 90 Minutes Intravenous  Once 08/31/23 1402 08/31/23 1405   08/31/23 1415  cefTRIAXone  (ROCEPHIN ) 1 g in sodium chloride  0.9 % 100 mL IVPB        1 g 200 mL/hr over 30 Minutes Intravenous  Once 08/31/23 1405 08/31/23 1526   08/31/23 1415  azithromycin  (ZITHROMAX ) 500 mg in sodium chloride  0.9 % 250 mL IVPB        500 mg 250 mL/hr over 60 Minutes Intravenous  Once 08/31/23 1405 08/31/23 1628        Assessment/Plan S/p Robotic repair of Type III incarcerated paraesophageal hernia, gastropexy, gastrostomy tube placement, reduction of bowel from incarcerated right lower quadrant hernia, lysis of adhesions x41min Dr. Lanell Pinta 5/7 for incarcerated paraesophageal hernia with gastric outlet obstruction and bleeding  cameron ulcers, incarcerated Right lower quadrant hernia containing colon  - CT reassuring as above. Afebrile. WBC wnl. No peritonitis on exam.  - No indication for emergency surgery - With her report of food getting stuck w/ n/v after eating solid food will back down to FLD and obtain UGI  FEN - FLD. PPI VTE - SCDs, on home eliquis  ID - Abx for PNA per [facility]    LOS: 1 day    Delton Filbert, [facility] Surgery 09/01/2023, 3:24 PM Please see Amion for pager number during day hours 7:00am-4:30pm

## 2023-09-01 NOTE — Plan of Care (Signed)
 ?  Problem: Education: ?Goal: Knowledge of General Education information will improve ?Description: Including pain rating scale, medication(s)/side effects and non-pharmacologic comfort measures ?Outcome: Progressing ?  ?Problem: Health Behavior/Discharge Planning: ?Goal: Ability to manage health-related needs will improve ?Outcome: Progressing ?  ?Problem: Coping: ?Goal: Level of anxiety will decrease ?Outcome: Progressing ?  ?

## 2023-09-02 ENCOUNTER — Inpatient Hospital Stay (HOSPITAL_COMMUNITY)

## 2023-09-02 DIAGNOSIS — R112 Nausea with vomiting, unspecified: Secondary | ICD-10-CM | POA: Diagnosis not present

## 2023-09-02 DIAGNOSIS — J189 Pneumonia, unspecified organism: Secondary | ICD-10-CM | POA: Diagnosis not present

## 2023-09-02 DIAGNOSIS — I4811 Longstanding persistent atrial fibrillation: Secondary | ICD-10-CM | POA: Diagnosis not present

## 2023-09-02 DIAGNOSIS — E876 Hypokalemia: Secondary | ICD-10-CM | POA: Diagnosis not present

## 2023-09-02 LAB — BASIC METABOLIC PANEL WITH GFR
Anion gap: 10 (ref 5–15)
BUN: 8 mg/dL (ref 8–23)
CO2: 28 mmol/L (ref 22–32)
Calcium: 8.4 mg/dL — ABNORMAL LOW (ref 8.9–10.3)
Chloride: 103 mmol/L (ref 98–111)
Creatinine, Ser: 0.52 mg/dL (ref 0.44–1.00)
GFR, Estimated: >60 mL/min (ref >60)
Glucose, Bld: 123 mg/dL — ABNORMAL HIGH (ref 70–99)
Potassium: 3.6 mmol/L (ref 3.5–5.1)
Sodium: 141 mmol/L (ref 135–145)

## 2023-09-02 LAB — CBC
HCT: 34.7 % — ABNORMAL LOW (ref 36.0–46.0)
Hemoglobin: 11.2 g/dL — ABNORMAL LOW (ref 12.0–15.0)
MCH: 31.5 pg (ref 26.0–34.0)
MCHC: 32.3 g/dL (ref 30.0–36.0)
MCV: 97.5 fL (ref 80.0–100.0)
Platelets: 228 10*3/uL (ref 150–400)
RBC: 3.56 MIL/uL — ABNORMAL LOW (ref 3.87–5.11)
RDW: 13.5 % (ref 11.5–15.5)
WBC: 5.8 10*3/uL (ref 4.0–10.5)
nRBC: 0 % (ref 0.0–0.2)

## 2023-09-02 LAB — MAGNESIUM: Magnesium: 2.2 mg/dL (ref 1.7–2.4)

## 2023-09-02 MED ORDER — IOHEXOL 300 MG/ML  SOLN
100.0000 mL | Freq: Once | INTRAMUSCULAR | Status: AC | PRN
  Administered 2023-09-02: 100 mL via ORAL

## 2023-09-02 MED ORDER — POTASSIUM CHLORIDE CRYS ER 20 MEQ PO TBCR
40.0000 meq | EXTENDED_RELEASE_TABLET | Freq: Once | ORAL | Status: AC
  Administered 2023-09-02: 40 meq via ORAL
  Filled 2023-09-02: qty 2

## 2023-09-02 NOTE — Plan of Care (Signed)
  Problem: Activity: Goal: Risk for activity intolerance will decrease Outcome: Progressing   Problem: Coping: Goal: Level of anxiety will decrease Outcome: Progressing   Problem: Safety: Goal: Ability to remain free from injury will improve Outcome: Progressing   Problem: Skin Integrity: Goal: Risk for impaired skin integrity will decrease Outcome: Progressing   Problem: Nutrition: Goal: Adequate nutrition will be maintained Outcome: Not Progressing

## 2023-09-02 NOTE — Progress Notes (Signed)
 [facility] PROGRESS NOTE   Darlene [name]:[id number] DOB: Jul 18, 1940 DOA: 08/31/2023  PCP: Jenelle Mis, FNP  Brief History: 83 y.o. female with history of with a history of HTN/HLD, GERD, OSA noncompliant with CPAP, A-fib on Eliquis , paraesophageal hernia status post repair 2 months ago, G-tube placement (not in use, scheduled for removal later this month), presenting with worsening shortness of breath, cough, nausea, weakness.  Patient was noted to have leukocytosis.  Imaging studies suggested pneumonia.  Patient was hospitalized for further management.     Consultants: None  Procedures: None   Subjective/Interval History: Patient mentions that she continues to have a cough without much improvement compared to yesterday.  Still having episodes of gagging with nausea vomiting.  Unable to tolerate anything by mouth as yet.  Denies any abdominal pain though.     Assessment/Plan:  Community-acquired pneumonia/sepsis, present on admission Patient had leukocytosis tachypnea at the time of admission. Imaging study showed bilateral consolidation. Urine strep pneumo antigen is positive. COVID-19 RSV and influenza PCR were negative. Due to her episodes of nausea and vomiting there could be a component of aspiration pneumonia as well. Continue with ceftriaxone  and azithromycin . Respiratory status is stable.  Continue to mobilize as tolerated.  Nausea and vomiting This is in the setting of recent paraesophageal hernia repair in April 2025.  She underwent upper endoscopy in April. Recently done CT scan was reassuring. General surgery was consulted due to her significant symptoms.  She has a G-tube but that she has not been using it in the last few weeks. Unable to tolerate anything by mouth currently.  Plan is for upper GI series today. Continue with PPI.  Permanent atrial fibrillation Noted to be on amiodarone  metoprolol  and diltiazem .  Anticoagulated with  Eliquis .  Paraesophageal hernia s/p repair in April 2025 Has history of GI bleed with aspiration during previous hospitalization.  Had a prolonged ICU stay. Was discharged to short-term rehab and she came home at the the end of May. Continue PPI. She has a G-tube which is currently not in use.  Obstructive sleep apnea Has been noncompliant with CPAP.   Essential hypertension Continue home medications.  Hypokalemia/hypomagnesemia Potassium and magnesium  are both supplemented.  Improvement noted.    Normocytic anemia No evidence of overt bleeding.  Cholelithiasis Incidentally noted on CT scan.  Asymptomatic.  LFTs are normal.  Obesity Estimated body mass index is 30.13 kg/m as calculated from the following:   Height as of this encounter: 5' 10 (1.778 m).   Weight as of this encounter: 95.3 kg.   DVT Prophylaxis: On apixaban  Code Status: Full code Family Communication: Discussed with her daughter Disposition Plan: Home with home health when medically improved    Medications: Scheduled:  acidophilus  1 capsule Oral Daily   amiodarone   200 mg Oral Daily   apixaban   5 mg Oral BID   busPIRone   5 mg Oral TID   Chlorhexidine  Gluconate Cloth  6 each Topical Daily   diltiazem   180 mg Oral Daily   escitalopram   20 mg Oral Daily   ferrous sulfate   325 mg Oral QODAY   furosemide   20 mg Oral Daily   metoprolol  succinate  12.5 mg Oral Daily   mupirocin ointment  1 Application Nasal BID   pantoprazole   40 mg Oral BID   potassium chloride   40 mEq Oral Once   Continuous:  azithromycin  500 mg (09/01/23 0913)   cefTRIAXone  (ROCEPHIN )  IV 2 g (09/01/23 1448)  PRN:albuterol , ondansetron  (ZOFRAN ) IV, traMADol   Antibiotics: Anti-infectives (From admission, onward)    Start     Dose/Rate Route Frequency Ordered Stop   09/01/23 1830  vancomycin  (VANCOREADY) IVPB 1750 mg/350 mL  Status:  Discontinued        1,750 mg 175 mL/hr over 120 Minutes Intravenous Every 24 hours 08/31/23  1827 09/01/23 1525   09/01/23 1400  cefTRIAXone  (ROCEPHIN ) 2 g in sodium chloride  0.9 % 100 mL IVPB        2 g 200 mL/hr over 30 Minutes Intravenous Every 24 hours 08/31/23 1802     09/01/23 1000  azithromycin  (ZITHROMAX ) 500 mg in sodium chloride  0.9 % 250 mL IVPB        500 mg 250 mL/hr over 60 Minutes Intravenous Every 24 hours 08/31/23 1802 09/04/23 0959   08/31/23 1815  vancomycin  (VANCOREADY) IVPB 2000 mg/400 mL        2,000 mg 200 mL/hr over 120 Minutes Intravenous  Once 08/31/23 1805 08/31/23 2045   08/31/23 1415  levofloxacin (LEVAQUIN) IVPB 750 mg  Status:  Discontinued        750 mg 100 mL/hr over 90 Minutes Intravenous  Once 08/31/23 1402 08/31/23 1405   08/31/23 1415  cefTRIAXone  (ROCEPHIN ) 1 g in sodium chloride  0.9 % 100 mL IVPB        1 g 200 mL/hr over 30 Minutes Intravenous  Once 08/31/23 1405 08/31/23 1526   08/31/23 1415  azithromycin  (ZITHROMAX ) 500 mg in sodium chloride  0.9 % 250 mL IVPB        500 mg 250 mL/hr over 60 Minutes Intravenous  Once 08/31/23 1405 08/31/23 1628       Objective:  Vital Signs  Vitals:   09/01/23 0911 09/01/23 1551 09/01/23 1940 09/02/23 0520  BP: (!) 119/58 114/68 127/68 124/82  Pulse: 99 86 (!) 101 86  Resp:  17 16 17   Temp:  98.8 F (37.1 C) 97.8 F (36.6 C) (!) 97.5 F (36.4 C)  TempSrc:  Oral Oral Oral  SpO2:  91% 90% 94%  Weight:      Height:        Intake/Output Summary (Last 24 hours) at 09/02/2023 0935 Last data filed at 09/02/2023 0520 Gross per 24 hour  Intake 370 ml  Output 1500 ml  Net -1130 ml   Filed Weights   08/31/23 0954  Weight: 95.3 kg    General appearance: Awake alert.  In no distress Resp: Normal effort at rest.  Diminished entry at the bases with few crackles.  No wheezing or rhonchi. Cardio: S1-S2 is normal regular.  No S3-S4.  No rubs murmurs or bruit GI: Abdomen is soft.  Nontender nondistended.  Bowel sounds are present normal.  No masses organomegaly Moving all 4 extremities.  Physical  deconditioning noted.   Lab Results:  Data Reviewed: I have personally reviewed following labs and reports of the imaging studies  CBC: Recent Labs  Lab 08/31/23 1057 09/01/23 0621 09/02/23 0625  WBC 18.5* 7.6 5.8  NEUTROABS 16.3*  --   --   HGB 12.8 10.7* 11.2*  HCT 40.0 33.2* 34.7*  MCV 96.9 95.7 97.5  PLT 262 198 228    Basic Metabolic Panel: Recent Labs  Lab 08/31/23 1057 09/01/23 0621 09/01/23 1208 09/02/23 0625  NA 136 137  --  141  K 3.2* 3.0*  --  3.6  CL 101 102  --  103  CO2 24 26  --  28  GLUCOSE 157* 115*  --  123*  BUN 18 11  --  8  CREATININE 0.87 0.55  --  0.52  CALCIUM 9.3 8.5*  --  8.4*  MG  --   --  1.3* 2.2    GFR: Estimated Creatinine Clearance: 66.6 mL/min (by C-G formula based on SCr of 0.52 mg/dL).  Liver Function Tests: Recent Labs  Lab 08/31/23 1057 09/01/23 0621  AST 23 16  ALT 35 24  ALKPHOS 55 46  BILITOT 1.2 1.1  PROT 6.3* 5.1*  ALBUMIN  3.0* 2.4*    Coagulation Profile: Recent Labs  Lab 08/31/23 1401 09/01/23 0621  INR 2.1* 1.9*     Recent Results (from the past 240 hours)  SARS Coronavirus 2 by RT PCR (hospital order, performed in [facility] hospital lab) *cepheid single result test* Anterior Nasal Swab     Status: None   Collection Time: 08/31/23 10:58 AM   Specimen: Anterior Nasal Swab  Result Value Ref Range Status   SARS Coronavirus 2 by RT PCR NEGATIVE NEGATIVE Final    Comment: Performed at [facility], [address] [place], [place] [address]  Resp panel by RT-PCR (RSV, Flu A&B, Covid) Anterior Nasal Swab     Status: None   Collection Time: 08/31/23  2:01 PM   Specimen: Anterior Nasal Swab  Result Value Ref Range Status   SARS Coronavirus 2 by RT PCR NEGATIVE NEGATIVE Final   Influenza A by PCR NEGATIVE NEGATIVE Final   Influenza B by PCR NEGATIVE NEGATIVE Final    Comment: (NOTE) The Xpert Xpress SARS-CoV-2/FLU/RSV plus assay is intended as an aid in the diagnosis of influenza from  Nasopharyngeal swab specimens and should not be used as a sole basis for treatment. Nasal washings and aspirates are unacceptable for Xpert Xpress SARS-CoV-2/FLU/RSV testing.  Fact Sheet for Patients: BloggerCourse.com  Fact Sheet for Healthcare Providers: SeriousBroker.it  This test is not yet approved or cleared by the [place]  FDA and has been authorized for detection and/or diagnosis of SARS-CoV-2 by FDA under an Emergency Use Authorization (EUA). This EUA will remain in effect (meaning this test can be used) for the duration of the COVID-19 declaration under Section 564(b)(1) of the Act, 21 U.S.C. section 360bbb-3(b)(1), unless the authorization is terminated or revoked.     Resp Syncytial Virus by PCR NEGATIVE NEGATIVE Final    Comment: (NOTE) Fact Sheet for Patients: BloggerCourse.com  Fact Sheet for Healthcare Providers: SeriousBroker.it  This test is not yet approved or cleared by the [place]  FDA and has been authorized for detection and/or diagnosis of SARS-CoV-2 by FDA under an Emergency Use Authorization (EUA). This EUA will remain in effect (meaning this test can be used) for the duration of the COVID-19 declaration under Section 564(b)(1) of the Act, 21 U.S.C. section 360bbb-3(b)(1), unless the authorization is terminated or revoked.  Performed at [facility], [address], [place], [place] [address]   Blood Culture (routine x 2)     Status: None (Preliminary result)   Collection Time: 08/31/23  2:15 PM   Specimen: BLOOD LEFT WRIST  Result Value Ref Range Status   Specimen Description BLOOD LEFT WRIST  Final   Special Requests   Final    BOTTLES DRAWN AEROBIC AND ANAEROBIC Blood Culture adequate volume   Culture   Final    NO GROWTH 2 DAYS Performed at [facility], [address] [place], [place] [address]    Report Status  PENDING  Incomplete  Blood Culture (  routine x 2)     Status: None (Preliminary result)   Collection Time: 08/31/23  2:20 PM   Specimen: BLOOD RIGHT FOREARM  Result Value Ref Range Status   Specimen Description BLOOD RIGHT FOREARM  Final   Special Requests   Final    BOTTLES DRAWN AEROBIC AND ANAEROBIC Blood Culture adequate volume   Culture   Final    NO GROWTH 2 DAYS Performed at [facility], [address], [place], [place] [address]    Report Status PENDING  Incomplete  MRSA Next Gen by PCR, Nasal     Status: Abnormal   Collection Time: 08/31/23 10:29 PM  Result Value Ref Range Status   MRSA by PCR Next Gen DETECTED (A) NOT DETECTED Final    Comment: RESULT CALLED TO, READ BACK BY AND VERIFIED WITH: SIMON RN 09/01/2023 @ 0202 (NOTE) The GeneXpert MRSA Assay (FDA approved for NASAL specimens only), is one component of a comprehensive MRSA colonization surveillance program. It is not intended to diagnose MRSA infection nor to guide or monitor treatment for MRSA infections. Test performance is not FDA approved in patients less than 14 years old. Performed at [facility], [address] [place], [place] [address]   Respiratory (~20 pathogens) panel by PCR     Status: None   Collection Time: 09/01/23  3:34 AM   Specimen: Urine, Clean Catch; Respiratory  Result Value Ref Range Status   Adenovirus NOT DETECTED NOT DETECTED Final   Coronavirus 229E NOT DETECTED NOT DETECTED Final    Comment: (NOTE) The Coronavirus on the Respiratory Panel, DOES NOT test for the novel  Coronavirus (2019 nCoV)    Coronavirus HKU1 NOT DETECTED NOT DETECTED Final   Coronavirus NL63 NOT DETECTED NOT DETECTED Final   Coronavirus OC43 NOT DETECTED NOT DETECTED Final   Metapneumovirus NOT DETECTED NOT DETECTED Final   Rhinovirus / Enterovirus NOT DETECTED NOT DETECTED Final   Influenza A NOT DETECTED NOT DETECTED Final   Influenza B NOT DETECTED NOT DETECTED Final   Parainfluenza Virus 1  NOT DETECTED NOT DETECTED Final   Parainfluenza Virus 2 NOT DETECTED NOT DETECTED Final   Parainfluenza Virus 3 NOT DETECTED NOT DETECTED Final   Parainfluenza Virus 4 NOT DETECTED NOT DETECTED Final   Respiratory Syncytial Virus NOT DETECTED NOT DETECTED Final   Bordetella pertussis NOT DETECTED NOT DETECTED Final   Bordetella Parapertussis NOT DETECTED NOT DETECTED Final   Chlamydophila pneumoniae NOT DETECTED NOT DETECTED Final   Mycoplasma pneumoniae NOT DETECTED NOT DETECTED Final    Comment: Performed at [facility], [address] [place], [place] [address]      Radiology Studies: CT CHEST ABDOMEN PELVIS W CONTRAST Result Date: 08/31/2023 CLINICAL DATA:  Sepsis. EXAM: CT CHEST, ABDOMEN, AND PELVIS WITH CONTRAST TECHNIQUE: Multidetector CT imaging of the chest, abdomen and pelvis was performed following the standard protocol during bolus administration of intravenous contrast. RADIATION DOSE REDUCTION: This exam was performed according to the departmental dose-optimization program which includes automated exposure control, adjustment of the mA and/or kV according to patient size and/or use of iterative reconstruction technique. CONTRAST:  75mL OMNIPAQUE  IOHEXOL  350 MG/ML SOLN COMPARISON:  Chest radiograph 08/31/2023 and 07/13/2023. Chest CTA 06/30/2023. Abdominopelvic CTA 06/28/2023 and CT 05/31/2023. FINDINGS: CT CHEST FINDINGS Cardiovascular: No acute vascular findings or significant atherosclerosis. The heart remains mildly enlarged. No significant pericardial fluid. Mediastinum/Nodes: Mildly prominent mediastinal and hilar lymph nodes are noted, including a subcarinal node measuring 1.4 cm  on image 37/3. These were largely obscured on the most recent prior chest CTA atelectasis and pleural effusions. Patient has undergone paraesophageal hernia repair, and the esophagus is no longer fluid-filled or significantly distended. Endotracheal tube has been removed. Stable mild heterogeneity  of the thyroid  gland for which no specific follow-up imaging is recommended given the patient's age. Lungs/Pleura: Minimal residual right pleural effusion, improved from previous chest CT. There is improved aeration of both lung bases with residual or recurrent patchy airspace disease in both lower lobes. Mild dependent atelectasis in both upper lobes. Musculoskeletal/Chest wall: No chest wall mass or suspicious osseous findings. Mild spondylosis. CT ABDOMEN AND PELVIS FINDINGS Hepatobiliary: Scattered low-density hepatic cysts are similar to prior imaging. No suspicious liver lesions are identified. There is probable mild chronic intrahepatic biliary dilatation within the left lobe. There are small nitrogen containing gallstones. No evidence of gallbladder wall thickening or surrounding inflammation. Stable mild chronic extrahepatic biliary dilatation with the common hepatic duct measuring 14 mm in diameter. No evidence of choledocholithiasis. Pancreas: Mild pancreatic atrophy. No evidence of pancreatic ductal dilatation or surrounding inflammation. Spleen: Normal in size without focal abnormality. Adrenals/Urinary Tract: Both adrenal glands appear normal. Unchanged fat density lesion projecting laterally from the lower pole of the right kidney, measuring 4.1 x 2.7 cm on image 85/3, most consistent with an angiomyolipoma. No other evidence of renal mass. No evidence of urinary tract calculus or hydronephrosis. The bladder appears normal for its degree of distention. Stomach/Bowel: No enteric contrast administered. As above, findings of previous paraesophageal hernia repair with improved wall thickening of the distal esophagus. Percutaneous G-tube in place. No evidence of bowel distension, wall thickening or surrounding inflammation. No residual bowel herniation within the right lower quadrant spigelian hernia. Mildly prominent colonic stool, especially within the rectum. Vascular/Lymphatic: There are no enlarged  abdominal or pelvic lymph nodes. Aortic and branch vessel atherosclerosis without evidence of significant aneurysm or large vessel occlusion. Stable chronic calcified 9 mm splenic artery aneurysm on image 73/3. Reproductive: Status post hysterectomy. No evidence of adnexal mass. Other: No evidence of abdominal wall mass or hernia. No ascites or pneumoperitoneum. Musculoskeletal: No acute or significant osseous findings. Multilevel spondylosis associated with a convex right thoracolumbar scoliosis. IMPRESSION: 1. Improved aeration of both lung bases with residual or recurrent patchy airspace disease in both lower lobes compared with previous chest CT from 2 months ago. Findings could reflect recurrent pneumonia. Radiographic follow up recommended. Minimal residual right pleural effusion. 2. Mildly prominent mediastinal and hilar lymph nodes, nonspecific, but likely reactive. 3. Interval paraesophageal hernia repair with improved wall thickening of the distal esophagus. 4. No evidence of bowel obstruction, perforation or other acute findings in the abdomen. Mildly prominent colonic stool, especially within the rectum. 5. Cholelithiasis without evidence of acute cholecystitis. Stable mild chronic extrahepatic biliary dilatation and probable mild chronic intrahepatic biliary dilatation within the left lobe. 6. Stable fat density lesion projecting laterally from the lower pole of the right kidney, most consistent with an angiomyolipoma. 7.  Aortic Atherosclerosis (ICD10-I70.0). Electronically Signed   By: Elmon Hagedorn M.D.   On: 08/31/2023 16:06   DG Chest 2 View Result Date: 08/31/2023 CLINICAL DATA:  Pneumonia.  Vomiting and nausea.  Feeding tube EXAM: CHEST - 2 VIEW COMPARISON:  Chest x-ray 07/13/2023 FINDINGS: Hyperinflation. Mild opacity left lung base. Possible infiltrate. Increasing linear opacity at the right lung base favoring atelectasis. No pneumothorax or edema. Stable cardiopericardial silhouette.  Films are under penetrated. Degenerative changes of the spine. IMPRESSION:  Left lung base parenchymal opacity. Possible acute infiltrate or pneumonia. Right basilar more bandlike opacity. Atelectasis is favored. Overall recommend follow-up. Hyperinflation with chronic changes. Electronically Signed   By: Adrianna Horde M.D.   On: 08/31/2023 12:38       LOS: 2 days   Darlene [name]  [facility] Pager on [email or web address]  09/02/2023, 9:35 AM

## 2023-09-02 NOTE — Plan of Care (Signed)

## 2023-09-02 NOTE — Progress Notes (Signed)
 Subjective: Ate FLD yesterday and seemed to do ok.  Still with some regurg mostly after coughing.  Objective: Vital signs in last 24 hours: Temp:  [97.5 F (36.4 C)-98.8 F (37.1 C)] 97.5 F (36.4 C) (06/19 0520) Pulse Rate:  [86-101] 86 (06/19 0520) Resp:  [16-17] 17 (06/19 0520) BP: (114-127)/(58-82) 124/82 (06/19 0520) SpO2:  [90 %-94 %] 94 % (06/19 0520) Last BM Date : 08/31/23  Intake/Output from previous day: 06/18 0701 - 06/19 0700 In: 370 [P.O.:360] Out: 1500 [Urine:1500] Intake/Output this shift: No intake/output data recorded.  PE: Gen:  Alert, NAD, pleasant Abd: Soft, ND,  NT, incisions healing appropriately, G-tube at 4cm, clamped and without leakage or peri-wound skin changes.   Lab Results:  Recent Labs    09/01/23 0621 09/02/23 0625  WBC 7.6 5.8  HGB 10.7* 11.2*  HCT 33.2* 34.7*  PLT 198 228   BMET Recent Labs    09/01/23 0621 09/02/23 0625  NA 137 141  K 3.0* 3.6  CL 102 103  CO2 26 28  GLUCOSE 115* 123*  BUN 11 8  CREATININE 0.55 0.52  CALCIUM 8.5* 8.4*   PT/INR Recent Labs    08/31/23 1401 09/01/23 0621  LABPROT 23.5* 21.7*  INR 2.1* 1.9*   CMP     Component Value Date/Time   NA 141 09/02/2023 0625   K 3.6 09/02/2023 0625   CL 103 09/02/2023 0625   CO2 28 09/02/2023 0625   GLUCOSE 123 (H) 09/02/2023 0625   BUN 8 09/02/2023 0625   CREATININE 0.52 09/02/2023 0625   CREATININE 0.72 02/16/2023 1045   CALCIUM 8.4 (L) 09/02/2023 0625   PROT 5.1 (L) 09/01/2023 0621   ALBUMIN  2.4 (L) 09/01/2023 0621   AST 16 09/01/2023 0621   ALT 24 09/01/2023 0621   ALKPHOS 46 09/01/2023 0621   BILITOT 1.1 09/01/2023 0621   GFRNONAA >60 09/02/2023 0625   GFRNONAA 75 04/03/2020 1030   GFRAA 87 04/03/2020 1030   Lipase     Component Value Date/Time   LIPASE 24 06/28/2023 1806    Studies/Results: CT CHEST ABDOMEN PELVIS W CONTRAST Result Date: 08/31/2023 CLINICAL DATA:  Sepsis. EXAM: CT CHEST, ABDOMEN, AND PELVIS WITH CONTRAST  TECHNIQUE: Multidetector CT imaging of the chest, abdomen and pelvis was performed following the standard protocol during bolus administration of intravenous contrast. RADIATION DOSE REDUCTION: This exam was performed according to the departmental dose-optimization program which includes automated exposure control, adjustment of the mA and/or kV according to patient size and/or use of iterative reconstruction technique. CONTRAST:  75mL OMNIPAQUE  IOHEXOL  350 MG/ML SOLN COMPARISON:  Chest radiograph 08/31/2023 and 07/13/2023. Chest CTA 06/30/2023. Abdominopelvic CTA 06/28/2023 and CT 05/31/2023. FINDINGS: CT CHEST FINDINGS Cardiovascular: No acute vascular findings or significant atherosclerosis. The heart remains mildly enlarged. No significant pericardial fluid. Mediastinum/Nodes: Mildly prominent mediastinal and hilar lymph nodes are noted, including a subcarinal node measuring 1.4 cm on image 37/3. These were largely obscured on the most recent prior chest CTA atelectasis and pleural effusions. Patient has undergone paraesophageal hernia repair, and the esophagus is no longer fluid-filled or significantly distended. Endotracheal tube has been removed. Stable mild heterogeneity of the thyroid  gland for which no specific follow-up imaging is recommended given the patient's age. Lungs/Pleura: Minimal residual right pleural effusion, improved from previous chest CT. There is improved aeration of both lung bases with residual or recurrent patchy airspace disease in both lower lobes. Mild dependent atelectasis in both upper lobes. Musculoskeletal/Chest wall: No  chest wall mass or suspicious osseous findings. Mild spondylosis. CT ABDOMEN AND PELVIS FINDINGS Hepatobiliary: Scattered low-density hepatic cysts are similar to prior imaging. No suspicious liver lesions are identified. There is probable mild chronic intrahepatic biliary dilatation within the left lobe. There are small nitrogen containing gallstones. No  evidence of gallbladder wall thickening or surrounding inflammation. Stable mild chronic extrahepatic biliary dilatation with the common hepatic duct measuring 14 mm in diameter. No evidence of choledocholithiasis. Pancreas: Mild pancreatic atrophy. No evidence of pancreatic ductal dilatation or surrounding inflammation. Spleen: Normal in size without focal abnormality. Adrenals/Urinary Tract: Both adrenal glands appear normal. Unchanged fat density lesion projecting laterally from the lower pole of the right kidney, measuring 4.1 x 2.7 cm on image 85/3, most consistent with an angiomyolipoma. No other evidence of renal mass. No evidence of urinary tract calculus or hydronephrosis. The bladder appears normal for its degree of distention. Stomach/Bowel: No enteric contrast administered. As above, findings of previous paraesophageal hernia repair with improved wall thickening of the distal esophagus. Percutaneous G-tube in place. No evidence of bowel distension, wall thickening or surrounding inflammation. No residual bowel herniation within the right lower quadrant spigelian hernia. Mildly prominent colonic stool, especially within the rectum. Vascular/Lymphatic: There are no enlarged abdominal or pelvic lymph nodes. Aortic and branch vessel atherosclerosis without evidence of significant aneurysm or large vessel occlusion. Stable chronic calcified 9 mm splenic artery aneurysm on image 73/3. Reproductive: Status post hysterectomy. No evidence of adnexal mass. Other: No evidence of abdominal wall mass or hernia. No ascites or pneumoperitoneum. Musculoskeletal: No acute or significant osseous findings. Multilevel spondylosis associated with a convex right thoracolumbar scoliosis. IMPRESSION: 1. Improved aeration of both lung bases with residual or recurrent patchy airspace disease in both lower lobes compared with previous chest CT from 2 months ago. Findings could reflect recurrent pneumonia. Radiographic follow up  recommended. Minimal residual right pleural effusion. 2. Mildly prominent mediastinal and hilar lymph nodes, nonspecific, but likely reactive. 3. Interval paraesophageal hernia repair with improved wall thickening of the distal esophagus. 4. No evidence of bowel obstruction, perforation or other acute findings in the abdomen. Mildly prominent colonic stool, especially within the rectum. 5. Cholelithiasis without evidence of acute cholecystitis. Stable mild chronic extrahepatic biliary dilatation and probable mild chronic intrahepatic biliary dilatation within the left lobe. 6. Stable fat density lesion projecting laterally from the lower pole of the right kidney, most consistent with an angiomyolipoma. 7.  Aortic Atherosclerosis (ICD10-I70.0). Electronically Signed   By: Elmon Hagedorn M.D.   On: 08/31/2023 16:06   DG Chest 2 View Result Date: 08/31/2023 CLINICAL DATA:  Pneumonia.  Vomiting and nausea.  Feeding tube EXAM: CHEST - 2 VIEW COMPARISON:  Chest x-ray 07/13/2023 FINDINGS: Hyperinflation. Mild opacity left lung base. Possible infiltrate. Increasing linear opacity at the right lung base favoring atelectasis. No pneumothorax or edema. Stable cardiopericardial silhouette. Films are under penetrated. Degenerative changes of the spine. IMPRESSION: Left lung base parenchymal opacity. Possible acute infiltrate or pneumonia. Right basilar more bandlike opacity. Atelectasis is favored. Overall recommend follow-up. Hyperinflation with chronic changes. Electronically Signed   By: Adrianna Horde M.D.   On: 08/31/2023 12:38    Anti-infectives: Anti-infectives (From admission, onward)    Start     Dose/Rate Route Frequency Ordered Stop   09/01/23 1830  vancomycin  (VANCOREADY) IVPB 1750 mg/350 mL  Status:  Discontinued        1,750 mg 175 mL/hr over 120 Minutes Intravenous Every 24 hours 08/31/23 1827 09/01/23 1525  09/01/23 1400  cefTRIAXone  (ROCEPHIN ) 2 g in sodium chloride  0.9 % 100 mL IVPB        2  g 200 mL/hr over 30 Minutes Intravenous Every 24 hours 08/31/23 1802     09/01/23 1000  azithromycin  (ZITHROMAX ) 500 mg in sodium chloride  0.9 % 250 mL IVPB        500 mg 250 mL/hr over 60 Minutes Intravenous Every 24 hours 08/31/23 1802 09/04/23 0959   08/31/23 1815  vancomycin  (VANCOREADY) IVPB 2000 mg/400 mL        2,000 mg 200 mL/hr over 120 Minutes Intravenous  Once 08/31/23 1805 08/31/23 2045   08/31/23 1415  levofloxacin (LEVAQUIN) IVPB 750 mg  Status:  Discontinued        750 mg 100 mL/hr over 90 Minutes Intravenous  Once 08/31/23 1402 08/31/23 1405   08/31/23 1415  cefTRIAXone  (ROCEPHIN ) 1 g in sodium chloride  0.9 % 100 mL IVPB        1 g 200 mL/hr over 30 Minutes Intravenous  Once 08/31/23 1405 08/31/23 1526   08/31/23 1415  azithromycin  (ZITHROMAX ) 500 mg in sodium chloride  0.9 % 250 mL IVPB        500 mg 250 mL/hr over 60 Minutes Intravenous  Once 08/31/23 1405 08/31/23 1628        Assessment/Plan S/p Robotic repair of Type III incarcerated paraesophageal hernia, gastropexy, gastrostomy tube placement, reduction of bowel from incarcerated right lower quadrant hernia, lysis of adhesions x74min Dr. Lanell Pinta 5/7 for incarcerated paraesophageal hernia with gastric outlet obstruction and bleeding cameron ulcers, incarcerated Right lower quadrant hernia containing colon  - CT reassuring as above. Afebrile. WBC wnl. No peritonitis on exam.  - No indication for emergency surgery - With her report of food getting stuck w/ n/v after eating solid food will back down to FLD and obtain UGI, which she is going down for now  FEN - FLD. PPI VTE - SCDs, on home eliquis  ID - Abx for PNA per [facility]    LOS: 2 days    Leone Ralphs, [facility] Surgery 09/02/2023, 8:35 AM Please see Amion for pager number during day hours 7:00am-4:30pm

## 2023-09-02 NOTE — Progress Notes (Signed)
 Patient was complaining of SOB with non-productive cough. PRN Nebulizer treatment administered, tolerated well and felt relief.

## 2023-09-02 NOTE — Progress Notes (Signed)
 Dried blood noted under IV dressing, PIV dressing has been changed.

## 2023-09-02 NOTE — Progress Notes (Signed)
 Mobility Specialist Progress Note:    09/02/23 1201  Mobility  Activity Ambulated with assistance in hallway  Level of Assistance Contact guard assist, steadying assist  Assistive Device Front wheel walker  Distance Ambulated (ft) 84 ft  Activity Response Tolerated well  Mobility Referral Yes  Mobility visit 1 Mobility  Mobility Specialist Start Time (ACUTE ONLY) 1112  Mobility Specialist Stop Time (ACUTE ONLY) 1123  Mobility Specialist Time Calculation (min) (ACUTE ONLY) 11 min   Pt received in bed, agreeable to mobility. No physical assistance needed. No c/o throughout ambulation. Returned to room w/o fault. Personal belongings and call light w/in reach. Bed alarm on. All needs met.   Doak Free Mobility Specialist  Please contact via [employer] or  Rehab Office [phone]

## 2023-09-03 ENCOUNTER — Other Ambulatory Visit (HOSPITAL_COMMUNITY): Payer: Self-pay

## 2023-09-03 DIAGNOSIS — R531 Weakness: Secondary | ICD-10-CM | POA: Diagnosis not present

## 2023-09-03 DIAGNOSIS — K2100 Gastro-esophageal reflux disease with esophagitis, without bleeding: Secondary | ICD-10-CM

## 2023-09-03 DIAGNOSIS — R0602 Shortness of breath: Secondary | ICD-10-CM | POA: Diagnosis not present

## 2023-09-03 DIAGNOSIS — E440 Moderate protein-calorie malnutrition: Secondary | ICD-10-CM | POA: Insufficient documentation

## 2023-09-03 DIAGNOSIS — J189 Pneumonia, unspecified organism: Secondary | ICD-10-CM | POA: Diagnosis not present

## 2023-09-03 LAB — CBC
HCT: 36.7 % (ref 36.0–46.0)
Hemoglobin: 11.7 g/dL — ABNORMAL LOW (ref 12.0–15.0)
MCH: 31 pg (ref 26.0–34.0)
MCHC: 31.9 g/dL (ref 30.0–36.0)
MCV: 97.1 fL (ref 80.0–100.0)
Platelets: 257 10*3/uL (ref 150–400)
RBC: 3.78 MIL/uL — ABNORMAL LOW (ref 3.87–5.11)
RDW: 13.6 % (ref 11.5–15.5)
WBC: 4.9 10*3/uL (ref 4.0–10.5)
nRBC: 0 % (ref 0.0–0.2)

## 2023-09-03 LAB — BASIC METABOLIC PANEL WITH GFR
Anion gap: 9 (ref 5–15)
BUN: 6 mg/dL — ABNORMAL LOW (ref 8–23)
CO2: 28 mmol/L (ref 22–32)
Calcium: 8.8 mg/dL — ABNORMAL LOW (ref 8.9–10.3)
Chloride: 102 mmol/L (ref 98–111)
Creatinine, Ser: 0.5 mg/dL (ref 0.44–1.00)
GFR, Estimated: >60 mL/min (ref >60)
Glucose, Bld: 111 mg/dL — ABNORMAL HIGH (ref 70–99)
Potassium: 3.9 mmol/L (ref 3.5–5.1)
Sodium: 139 mmol/L (ref 135–145)

## 2023-09-03 LAB — LEGIONELLA PNEUMOPHILA SEROGP 1 UR AG: L. pneumophila Serogp 1 Ur Ag: NEGATIVE

## 2023-09-03 LAB — MAGNESIUM: Magnesium: 1.9 mg/dL (ref 1.7–2.4)

## 2023-09-03 MED ORDER — METOPROLOL SUCCINATE ER 25 MG PO TB24
50.0000 mg | ORAL_TABLET | Freq: Every day | ORAL | Status: DC
  Administered 2023-09-03: 50 mg via ORAL
  Filled 2023-09-03: qty 2

## 2023-09-03 MED ORDER — PROSOURCE TF20 ENFIT COMPATIBL EN LIQD: 60.0000 mL | Freq: Two times a day (BID) | ENTERAL | 0 refills | Status: DC

## 2023-09-03 MED ORDER — METOPROLOL SUCCINATE ER 50 MG PO TB24
50.0000 mg | ORAL_TABLET | Freq: Every day | ORAL | 0 refills | Status: DC
Start: 2023-09-03 — End: 2023-10-05
  Filled 2023-09-03: qty 90, 90d supply, fill #0

## 2023-09-03 MED ORDER — AMOXICILLIN-POT CLAVULANATE 875-125 MG PO TABS
1.0000 | ORAL_TABLET | Freq: Two times a day (BID) | ORAL | 0 refills | Status: AC
  Filled 2023-09-03: qty 4, 2d supply, fill #0

## 2023-09-03 MED ORDER — OSMOLITE 1.5 CAL PO LIQD: 300.0000 mL | Freq: Four times a day (QID) | ORAL | 0 refills | Status: DC

## 2023-09-03 MED ORDER — FREE WATER: 250.0000 mL | Freq: Four times a day (QID) | 0 refills | Status: DC

## 2023-09-03 MED ORDER — OSMOLITE 1.5 CAL PO LIQD
300.0000 mL | Freq: Four times a day (QID) | ORAL | Status: DC
  Administered 2023-09-03: 300 mL
  Filled 2023-09-03 (×2): qty 474

## 2023-09-03 MED ORDER — PROSOURCE TF20 ENFIT COMPATIBL EN LIQD
60.0000 mL | Freq: Two times a day (BID) | ENTERAL | Status: DC
  Administered 2023-09-03: 60 mL
  Filled 2023-09-03: qty 60

## 2023-09-03 MED ORDER — FREE WATER
250.0000 mL | Freq: Four times a day (QID) | Status: DC
  Administered 2023-09-03: 250 mL

## 2023-09-03 NOTE — Progress Notes (Signed)
 Completed G Tube feeding education with patient and her daughter.

## 2023-09-03 NOTE — Plan of Care (Signed)
   Problem: Health Behavior/Discharge Planning: Goal: Ability to manage health-related needs will improve Outcome: Progressing   Problem: Clinical Measurements: Goal: Ability to maintain clinical measurements within normal limits will improve Outcome: Progressing   Problem: Clinical Measurements: Goal: Will remain free from infection Outcome: Progressing

## 2023-09-03 NOTE — Progress Notes (Addendum)
 Subjective: Eating FLD and tolerating currently.  No vomiting.  Objective: Vital signs in last 24 hours: Temp:  [97.8 F (36.6 C)-98.6 F (37 C)] 98.1 F (36.7 C) (06/20 0723) Pulse Rate:  [86-103] 103 (06/20 0723) Resp:  [16-18] 16 (06/20 0723) BP: (113-134)/(55-82) 129/72 (06/20 0723) SpO2:  [91 %-94 %] 92 % (06/20 0723) Last BM Date : 08/31/23  Intake/Output from previous day: 06/19 0701 - 06/20 0700 In: 840 [P.O.:840] Out: 800 [Urine:800] Intake/Output this shift: No intake/output data recorded.  PE: Gen:  Alert, NAD, pleasant, sitting up in her chair eating her breakfast Abd: Soft, ND,  NT, incisions healing appropriately, G-tube clamped and without leakage or peri-wound skin changes.   Lab Results:  Recent Labs    09/02/23 0625 09/03/23 0626  WBC 5.8 4.9  HGB 11.2* 11.7*  HCT 34.7* 36.7  PLT 228 257   BMET Recent Labs    09/02/23 0625 09/03/23 0626  NA 141 139  K 3.6 3.9  CL 103 102  CO2 28 28  GLUCOSE 123* 111*  BUN 8 6*  CREATININE 0.52 0.50  CALCIUM 8.4* 8.8*   PT/INR Recent Labs    08/31/23 1401 09/01/23 0621  LABPROT 23.5* 21.7*  INR 2.1* 1.9*   CMP     Component Value Date/Time   NA 139 09/03/2023 0626   K 3.9 09/03/2023 0626   CL 102 09/03/2023 0626   CO2 28 09/03/2023 0626   GLUCOSE 111 (H) 09/03/2023 0626   BUN 6 (L) 09/03/2023 0626   CREATININE 0.50 09/03/2023 0626   CREATININE 0.72 02/16/2023 1045   CALCIUM 8.8 (L) 09/03/2023 0626   PROT 5.1 (L) 09/01/2023 0621   ALBUMIN  2.4 (L) 09/01/2023 0621   AST 16 09/01/2023 0621   ALT 24 09/01/2023 0621   ALKPHOS 46 09/01/2023 0621   BILITOT 1.1 09/01/2023 0621   GFRNONAA >60 09/03/2023 0626   GFRNONAA 75 04/03/2020 1030   GFRAA 87 04/03/2020 1030   Lipase     Component Value Date/Time   LIPASE 24 06/28/2023 1806    Studies/Results: DG UGI W SINGLE CM (SOL OR THIN BA) Result Date: 09/02/2023 CLINICAL DATA:  83 year old female with recurrent vomiting status post  robotic repair of type III incarcerated paraesophageal hernia, gastropexy, gastrostomy tube placement, reduction of bowel from incarcerated right lower quadrant hernia, lysis of adhesions, 6 weeks ago. EXAM: DG UGI W SINGLE CM TECHNIQUE: Scout radiograph was obtained. Single contrast examination was performed using thin liquid barium. This exam was performed by Lambert Pillion, PA-C, and was supervised and interpreted by Dr. Erica Hau, MD. FLUOROSCOPY: Radiation Exposure Index (as provided by the fluoroscopic device): 29.8 mGy Kerma COMPARISON:  CT chest abdomen pelvis with contrast on 08/31/2023 FINDINGS: Scout Radiograph: Gastrostomy tube present.  No bowel obstruction. Esophagus: Presbyesophagus.  Flash penetration noted (image 1) Esophageal motility: Satisfactory primary peristalsis, with notable proximal escape, and tertiary contractions Gastroesophageal reflux: Notable full column reflux noted with coughing up to the level of the proximal esophagus. Ingested 13mm barium tablet:  Not given Stomach: Nissen fundoplication noted.  Gastrostomy tube in place. Gastric emptying: Normal. Duodenum:  Normal appearance. Other: Study was notably limited by patient's inability to stand, position side to side without assistance, and persistent cough. Study performed a 20 degree tilt. No extravasation of contrast media noted. IMPRESSION: Presbyesophagus with moderate esophageal dysmotility, with severe full column reflux on coughing. No extravasation of contrast media noted. Electronically Signed   By: Erica Hau  M.D.   On: 09/02/2023 10:34    Anti-infectives: Anti-infectives (From admission, onward)    Start     Dose/Rate Route Frequency Ordered Stop   09/01/23 1830  vancomycin  (VANCOREADY) IVPB 1750 mg/350 mL  Status:  Discontinued        1,750 mg 175 mL/hr over 120 Minutes Intravenous Every 24 hours 08/31/23 1827 09/01/23 1525   09/01/23 1400  cefTRIAXone  (ROCEPHIN ) 2 g in sodium chloride  0.9 % 100 mL  IVPB        2 g 200 mL/hr over 30 Minutes Intravenous Every 24 hours 08/31/23 1802     09/01/23 1000  azithromycin  (ZITHROMAX ) 500 mg in sodium chloride  0.9 % 250 mL IVPB        500 mg 250 mL/hr over 60 Minutes Intravenous Every 24 hours 08/31/23 1802 09/04/23 0959   08/31/23 1815  vancomycin  (VANCOREADY) IVPB 2000 mg/400 mL        2,000 mg 200 mL/hr over 120 Minutes Intravenous  Once 08/31/23 1805 08/31/23 2045   08/31/23 1415  levofloxacin (LEVAQUIN) IVPB 750 mg  Status:  Discontinued        750 mg 100 mL/hr over 90 Minutes Intravenous  Once 08/31/23 1402 08/31/23 1405   08/31/23 1415  cefTRIAXone  (ROCEPHIN ) 1 g in sodium chloride  0.9 % 100 mL IVPB        1 g 200 mL/hr over 30 Minutes Intravenous  Once 08/31/23 1405 08/31/23 1526   08/31/23 1415  azithromycin  (ZITHROMAX ) 500 mg in sodium chloride  0.9 % 250 mL IVPB        500 mg 250 mL/hr over 60 Minutes Intravenous  Once 08/31/23 1405 08/31/23 1628        Assessment/Plan S/p Robotic repair of Type III incarcerated paraesophageal hernia, gastropexy, gastrostomy tube placement, reduction of bowel from incarcerated right lower quadrant hernia, lysis of adhesions x42min Dr. Lanell Pinta 5/7 for incarcerated paraesophageal hernia with gastric outlet obstruction and bleeding cameron ulcers, incarcerated Right lower quadrant hernia containing colon  - CT reassuring as above. Afebrile. WBC wnl. No peritonitis on exam.  - UGI with significant dysmotility of the esophagus as not unexpected after this surgery. -will keep on FLD until seen by Dr. Lanell Pinta in the office.  Will have dietitian make recommendations for some tube feeds via her g-tube to give her extra nutrition for right now. -TOC consult to arrange TFs at home -surgically stable to DC home today if medically stable -d/w primary on ward  FEN - FLD/TFs/ PPI VTE - SCDs, on home eliquis  ID - Abx for PNA per [facility]    LOS: 3 days    Leone Ralphs, [facility]  Surgery 09/03/2023, 9:48 AM Please see Amion for pager number during day hours 7:00am-4:30pm

## 2023-09-03 NOTE — Discharge Instructions (Signed)
 Remain on a full liquid diet until you see Dr. Lanell Pinta in the office.

## 2023-09-03 NOTE — Discharge Summary (Signed)
 Physician Discharge Summary  NATANYA HOLECEK UJW:[id number] DOB: Jan 11, 1941 DOA: 08/31/2023  PCP: Jenelle Mis, FNP  Admit date: 08/31/2023 Discharge date: 09/03/23  Admitted From: Home Disposition: Home Recommendations for Outpatient Follow-up:  Outpatient follow-up with PCP and general surgery as below Check CMP and CBC at follow-up Please follow up on the following pending results: None  Home Health: No need identified Equipment/Devices: No need identified  Discharge Condition: Stable CODE STATUS: Full code  Follow-up Information     Adalberto Acton, MD Follow up on 09/15/2023.   Specialty: General Surgery Why: 10:50am, Arrive 30 minutes prior to your appointment time, Please bring your insurance card and photo ID Contact information: [address] [place] [address] [phone]         [facility] Follow up.   Specialty: [facility] Contact information: [address] [place] [address] [place] [address] [phone]         [facility] Follow up.   Why: phone [phone]        [place], [place], [place]. Schedule an appointment as soon as possible for a visit in 1 week(s).   Specialty: Family Medicine Contact information: [address] [place] [address] [phone]                 Hospital course 83 year old F with PMH of A-fib on Eliquis , OSA not on CPAP, HTN, HLD and paraesophageal hernia s/p repair and G-tube placement about 2 months ago presenting with worsening shortness of breath, cough and nausea, weakness, and admitted with sepsis in the setting of community-acquired pneumonia, intractable nausea and vomiting in the setting of paraesophageal hernia.  In ED, tachycardic and tachypneic.    WBC 18.5.  Lactic acid 2.2>> 2.8.  K3.2.  CXR with left lung base parenchymal opacity suggesting acute infiltrate/pneumonia and right basilar more bandlike opacity.  Patient was started on ceftriaxone  and  Zithromax .  General surgery consulted.  CT chest with improved aeration of both lung bases, interval paraesophageal hernia repair with improved wall thickening of distal esophagus cholelithiasis without cholecystitis and possible angiomyolipoma of right kidney.    Patient completed 3 days of Zithromax  and discharged on Augmentin for 2 more days to complete treatment course.  In regards to her intractable nausea and vomiting, upper GI series suggested presbyesophagus with moderate esophageal dysmotility with severe 4: Reflux on coughing.  General surgery recommended tube feeding per G-tube with full liquid diet for comfort.  See individual problem list below for more.   Problems addressed during this hospitalization Severe sepsis due to community-acquired pneumonia/possible aspiration pneumonia: Present on arrival.  Tachycardic with tachypnea, leukocytosis and lactic acidosis.  COVID-19, influenza, RSV, full panel RVP and blood cultures negative.  Respiratory symptoms improved significantly.  Completed 3 days of ceftriaxone  and Zithromax  in house.  Discharged on p.o. Augmentin for 2 more days to complete treatment course.  Aspiration precaution.  Full liquid diet.   Nausea and vomiting in the setting of paraesophageal hernia s/p repair in 2025.  Underwent upper endoscopy in 06/2023.  CT abdomen and pelvis and upper GI series as above. -General Surgery recommended full liquid for pleasure and bolus tube feeding for nutritional support. -Aspiration precaution -Continue PPI -Discontinue diltiazem  -Outpatient follow-up with general surgery    Permanent atrial fibrillation -Discontinue diltiazem  given esophageal dysmotility -Increase Toprol -XL from 12.5 mg daily to 50 mg daily -Continue amiodarone  -Continue Eliquis   Obstructive sleep apnea Has been noncompliant with  CPAP.    Essential hypertension -Metoprolol  as above   Hypokalemia/hypomagnesemia: Resolved   Normocytic anemia:  Stable -Check CBC at follow-up   Cholelithiasis: Incidentally noted on CT scan.  Asymptomatic.  LFTs are normal.   Obesity/moderate malnutrition Nutrition Problem: Moderate Malnutrition Etiology: acute illness (recent paraesophageal hernia repair with gastroplexy & G-tube placement) Signs/Symptoms: mild muscle depletion, mild fat depletion, percent weight loss (7.5% weight loss x 3 months) Percent weight loss: 7.5 % Interventions: Tube feeding, MVI     Time spent 35 minutes  Vital signs Vitals:   09/02/23 2014 09/03/23 0449 09/03/23 0723 09/03/23 1551  BP: (!) 134/55 133/74 129/72 (!) 108/48  Pulse: (!) 101 96 (!) 103 87  Temp: 98.6 F (37 C) 97.8 F (36.6 C) 98.1 F (36.7 C) 98.1 F (36.7 C)  Resp: 16 18 16 16   Height:      Weight:      SpO2: 94% 91% 92% 97%  TempSrc: Oral Oral Oral Oral  BMI (Calculated):         Discharge exam  GENERAL: No apparent distress.  Nontoxic. HEENT: MMM.  Vision and hearing grossly intact.  NECK: Supple.  No apparent JVD.  RESP:  No IWOB.  Fair aeration bilaterally. CVS:  RRR. Heart sounds normal.  ABD/GI/GU: BS+. Abd soft, NTND.  MSK/EXT:  Moves extremities. No apparent deformity. No edema.  SKIN: no apparent skin lesion or wound NEURO: Awake and alert. Oriented appropriately.  No apparent focal neuro deficit. PSYCH: Calm. Normal affect.   Discharge Instructions Discharge Instructions     Diet full liquid   Complete by: As directed    Discharge instructions   Complete by: As directed    It has been a pleasure taking care of you!  You were hospitalized due to pneumonia, nausea and vomiting.  You have been started on antibiotics for pneumonia.  We are discharging him on antibiotics to complete treatment course.   In regards to nausea and vomiting, continue full liquid diet and supplemental nutrition via G-tube.  Continue using Reglan  as needed.  We strongly recommend you stay upright when you are eating or drinking.  Please  review your new medication list and the directions on your medications before you take them.  Follow-up with your primary care doctor in 1 to 2 weeks or sooner if needed.  Follow-up with surgeon per their recommendation.   Take care,   Increase activity slowly   Complete by: As directed       Allergies as of 09/03/2023       Reactions   Cephalosporins Rash   Tolerated ceftriaxone  06/2023   Keflex [cephalexin] Rash        Medication List     STOP taking these medications    diltiazem  180 MG 24 hr capsule Commonly known as: CARDIZEM  CD       TAKE these medications    acetaminophen  500 MG tablet Commonly known as: TYLENOL  Take 500 mg by mouth every 6 (six) hours as needed for mild pain (pain score 1-3).   albuterol  108 (90 Base) MCG/ACT inhaler Commonly known as: VENTOLIN  HFA Inhale 2 puffs into the lungs every 6 (six) hours as needed for wheezing or shortness of breath.   amiodarone  200 MG tablet Commonly known as: PACERONE  Take 1 tablet (200 mg total) by mouth daily.   amoxicillin-clavulanate 875-125 MG tablet Commonly known as: AUGMENTIN Take 1 tablet by mouth 2 (two) times daily for 2 days.   busPIRone  5 MG tablet Commonly  known as: BUSPAR  Take 1 tablet (5 mg total) by mouth 3 (three) times daily.   CHOLECALCIFEROL PO Take 5,000 Units by mouth daily.   Cyanocobalamin  2500 MCG Tabs Take 2,500 mcg by mouth once a week.   docusate 50 MG/5ML liquid Commonly known as: COLACE Take 10 mLs (100 mg total) by mouth 2 (two) times daily. What changed:  when to take this reasons to take this   Eliquis  5 MG Tabs tablet Generic drug: apixaban  TAKE 1 TABLET BY MOUTH TWICE  DAILY   escitalopram  20 MG tablet Commonly known as: LEXAPRO  TAKE 1 TABLET BY MOUTH DAILY FOR MOOD, ANXIETY &amp; IRRITABILITY   feeding supplement (OSMOLITE 1.5 CAL) Liqd Place 300 mLs into feeding tube 4 (four) times daily. What changed:  how much to take how to take this when to  take this   feeding supplement (PROSource TF20) liquid Place 60 mLs into feeding tube 2 (two) times daily.   ferrous sulfate  325 (65 FE) MG tablet Take 1 tablet (325 mg total) by mouth every other day.   free water  Soln Place 250 mLs into feeding tube 4 (four) times daily. What changed:  how much to take when to take this   furosemide  20 MG tablet Commonly known as: Lasix  Take 1 tablet (20 mg total) by mouth daily.   loratadine 10 MG tablet Commonly known as: CLARITIN Take 10 mg by mouth daily.   metoCLOPramide  10 MG tablet Commonly known as: REGLAN  Take 1 tablet (10 mg total) by mouth 4 (four) times daily -  before meals and at bedtime. What changed:  when to take this reasons to take this   metoprolol  succinate 50 MG 24 hr tablet Commonly known as: TOPROL -XL Take 1 tablet (50 mg total) by mouth daily. What changed:  medication strength how much to take   nitroGLYCERIN  0.4 MG SL tablet Commonly known as: NITROSTAT  Place 1 tablet (0.4 mg total) under the tongue every 5 (five) minutes as needed for chest pain.   pantoprazole  40 MG tablet Commonly known as: Protonix  Take 1 tablet (40 mg total) by mouth 2 (two) times daily. Take  1 tablet  Daily  to prevent  Indigestion & Acid Reflux   PROBIOTIC DAILY PO Take 1 tablet by mouth daily.   traMADol  50 MG tablet Commonly known as: ULTRAM  Take 1 tablet (50 mg total) by mouth every 6 (six) hours as needed for severe pain (pain score 7-10).   Vitamin C 500 MG Caps Take 500 mg by mouth.               Durable Medical Equipment  (From admission, onward)           Start     Ordered   09/03/23 1040  For home use only DME Tube feeding  Once       Comments: Non-severe (moderate) malnutrition in context of acute illness/injury   INTERVENTION:    Begin bolus G-tube feedings:  Osmolite 1.5 (or equivalent) 300 ml QID at 8am, 12pm, 4pm, 8pm.  Prosource TF20 (or equivalent) 60 ml BID.  This provides 1935 kcal,  115 gm protein, 905 ml free water  daily.  Free water  flushes 125 ml before and after each bolus for a total of 1905 ml free water  daily.   Continue PO diet as tolerated for pleasure, POs are meeting < 10% of minimum estimated nutrition needs.   NUTRITION DIAGNOSIS:    Moderate Malnutrition related to acute illness (recent paraesophageal hernia repair with  gastroplexy & G-tube placement) as evidenced by mild muscle depletion, mild fat depletion, percent weight loss (7.5% weight loss x 3 months).   GOAL:    Patient will meet greater than or equal to 90% of their   Anticipated long term need . Patient unable to sustain herself nutritionally on her minimal intake   09/03/23 1049   09/03/23 0937  For home use only DME Tube feeding  Once        09/03/23 9562            Consultations: General Surgery  Procedures/Studies:   DG UGI W SINGLE CM (SOL OR THIN BA) Result Date: 09/02/2023 CLINICAL DATA:  83 year old female with recurrent vomiting status post robotic repair of type III incarcerated paraesophageal hernia, gastropexy, gastrostomy tube placement, reduction of bowel from incarcerated right lower quadrant hernia, lysis of adhesions, 6 weeks ago. EXAM: DG UGI W SINGLE CM TECHNIQUE: Scout radiograph was obtained. Single contrast examination was performed using thin liquid barium. This exam was performed by Lambert Pillion, PA-C, and was supervised and interpreted by Dr. Erica Hau, MD. FLUOROSCOPY: Radiation Exposure Index (as provided by the fluoroscopic device): 29.8 mGy Kerma COMPARISON:  CT chest abdomen pelvis with contrast on 08/31/2023 FINDINGS: [place] Radiograph: Gastrostomy tube present.  No bowel obstruction. Esophagus: Presbyesophagus.  Flash penetration noted (image 1) Esophageal motility: Satisfactory primary peristalsis, with notable proximal escape, and tertiary contractions Gastroesophageal reflux: Notable full column reflux noted with coughing up to the level of the  proximal esophagus. Ingested 13mm barium tablet:  Not given Stomach: Nissen fundoplication noted.  Gastrostomy tube in place. Gastric emptying: Normal. Duodenum:  Normal appearance. Other: Study was notably limited by patient's inability to stand, position side to side without assistance, and persistent cough. Study performed a 20 degree tilt. No extravasation of contrast media noted. IMPRESSION: Presbyesophagus with moderate esophageal dysmotility, with severe full column reflux on coughing. No extravasation of contrast media noted. Electronically Signed   By: Erica Hau M.D.   On: 09/02/2023 10:34   CT CHEST ABDOMEN PELVIS W CONTRAST Result Date: 08/31/2023 CLINICAL DATA:  Sepsis. EXAM: CT CHEST, ABDOMEN, AND PELVIS WITH CONTRAST TECHNIQUE: Multidetector CT imaging of the chest, abdomen and pelvis was performed following the standard protocol during bolus administration of intravenous contrast. RADIATION DOSE REDUCTION: This exam was performed according to the departmental dose-optimization program which includes automated exposure control, adjustment of the mA and/or kV according to patient size and/or use of iterative reconstruction technique. CONTRAST:  75mL OMNIPAQUE  IOHEXOL  350 MG/ML SOLN COMPARISON:  Chest radiograph 08/31/2023 and 07/13/2023. Chest CTA 06/30/2023. Abdominopelvic CTA 06/28/2023 and CT 05/31/2023. FINDINGS: CT CHEST FINDINGS Cardiovascular: No acute vascular findings or significant atherosclerosis. The heart remains mildly enlarged. No significant pericardial fluid. Mediastinum/Nodes: Mildly prominent mediastinal and hilar lymph nodes are noted, including a subcarinal node measuring 1.4 cm on image 37/3. These were largely obscured on the most recent prior chest CTA atelectasis and pleural effusions. Patient has undergone paraesophageal hernia repair, and the esophagus is no longer fluid-filled or significantly distended. Endotracheal tube has been removed. Stable mild heterogeneity  of the thyroid  gland for which no specific follow-up imaging is recommended given the patient's age. Lungs/Pleura: Minimal residual right pleural effusion, improved from previous chest CT. There is improved aeration of both lung bases with residual or recurrent patchy airspace disease in both lower lobes. Mild dependent atelectasis in both upper lobes. Musculoskeletal/Chest wall: No chest wall mass or suspicious osseous findings. Mild spondylosis. CT ABDOMEN AND PELVIS FINDINGS  Hepatobiliary: Scattered low-density hepatic cysts are similar to prior imaging. No suspicious liver lesions are identified. There is probable mild chronic intrahepatic biliary dilatation within the left lobe. There are small nitrogen containing gallstones. No evidence of gallbladder wall thickening or surrounding inflammation. Stable mild chronic extrahepatic biliary dilatation with the common hepatic duct measuring 14 mm in diameter. No evidence of choledocholithiasis. Pancreas: Mild pancreatic atrophy. No evidence of pancreatic ductal dilatation or surrounding inflammation. Spleen: Normal in size without focal abnormality. Adrenals/Urinary Tract: Both adrenal glands appear normal. Unchanged fat density lesion projecting laterally from the lower pole of the right kidney, measuring 4.1 x 2.7 cm on image 85/3, most consistent with an angiomyolipoma. No other evidence of renal mass. No evidence of urinary tract calculus or hydronephrosis. The bladder appears normal for its degree of distention. Stomach/Bowel: No enteric contrast administered. As above, findings of previous paraesophageal hernia repair with improved wall thickening of the distal esophagus. Percutaneous G-tube in place. No evidence of bowel distension, wall thickening or surrounding inflammation. No residual bowel herniation within the right lower quadrant spigelian hernia. Mildly prominent colonic stool, especially within the rectum. Vascular/Lymphatic: There are no enlarged  abdominal or pelvic lymph nodes. Aortic and branch vessel atherosclerosis without evidence of significant aneurysm or large vessel occlusion. Stable chronic calcified 9 mm splenic artery aneurysm on image 73/3. Reproductive: Status post hysterectomy. No evidence of adnexal mass. Other: No evidence of abdominal wall mass or hernia. No ascites or pneumoperitoneum. Musculoskeletal: No acute or significant osseous findings. Multilevel spondylosis associated with a convex right thoracolumbar scoliosis. IMPRESSION: 1. Improved aeration of both lung bases with residual or recurrent patchy airspace disease in both lower lobes compared with previous chest CT from 2 months ago. Findings could reflect recurrent pneumonia. Radiographic follow up recommended. Minimal residual right pleural effusion. 2. Mildly prominent mediastinal and hilar lymph nodes, nonspecific, but likely reactive. 3. Interval paraesophageal hernia repair with improved wall thickening of the distal esophagus. 4. No evidence of bowel obstruction, perforation or other acute findings in the abdomen. Mildly prominent colonic stool, especially within the rectum. 5. Cholelithiasis without evidence of acute cholecystitis. Stable mild chronic extrahepatic biliary dilatation and probable mild chronic intrahepatic biliary dilatation within the left lobe. 6. Stable fat density lesion projecting laterally from the lower pole of the right kidney, most consistent with an angiomyolipoma. 7.  Aortic Atherosclerosis (ICD10-I70.0). Electronically Signed   By: Elmon Hagedorn M.D.   On: 08/31/2023 16:06   DG Chest 2 View Result Date: 08/31/2023 CLINICAL DATA:  Pneumonia.  Vomiting and nausea.  Feeding tube EXAM: CHEST - 2 VIEW COMPARISON:  Chest x-ray 07/13/2023 FINDINGS: Hyperinflation. Mild opacity left lung base. Possible infiltrate. Increasing linear opacity at the right lung base favoring atelectasis. No pneumothorax or edema. Stable cardiopericardial silhouette.  Films are under penetrated. Degenerative changes of the spine. IMPRESSION: Left lung base parenchymal opacity. Possible acute infiltrate or pneumonia. Right basilar more bandlike opacity. Atelectasis is favored. Overall recommend follow-up. Hyperinflation with chronic changes. Electronically Signed   By: Adrianna Horde M.D.   On: 08/31/2023 12:38       The results of significant diagnostics from this hospitalization (including imaging, microbiology, ancillary and laboratory) are listed below for reference.     Microbiology: Recent Results (from the past 240 hours)  SARS Coronavirus 2 by RT PCR (hospital order, performed in [facility] hospital lab) *cepheid single result test* Anterior Nasal Swab     Status: None   Collection Time: 08/31/23 10:58 AM  Specimen: Anterior Nasal Swab  Result Value Ref Range Status   SARS Coronavirus 2 by RT PCR NEGATIVE NEGATIVE Final    Comment: Performed at [facility], [address], [place], [place] [address]  Resp panel by RT-PCR (RSV, Flu A&B, Covid) Anterior Nasal Swab     Status: None   Collection Time: 08/31/23  2:01 PM   Specimen: Anterior Nasal Swab  Result Value Ref Range Status   SARS Coronavirus 2 by RT PCR NEGATIVE NEGATIVE Final   Influenza A by PCR NEGATIVE NEGATIVE Final   Influenza B by PCR NEGATIVE NEGATIVE Final    Comment: (NOTE) The Xpert Xpress SARS-CoV-2/FLU/RSV plus assay is intended as an aid in the diagnosis of influenza from Nasopharyngeal swab specimens and should not be used as a sole basis for treatment. Nasal washings and aspirates are unacceptable for Xpert Xpress SARS-CoV-2/FLU/RSV testing.  Fact Sheet for Patients: BloggerCourse.com  Fact Sheet for Healthcare Providers: SeriousBroker.it  This test is not yet approved or cleared by the [place]  FDA and has been authorized for detection and/or diagnosis of SARS-CoV-2 by FDA under an Emergency Use  Authorization (EUA). This EUA will remain in effect (meaning this test can be used) for the duration of the COVID-19 declaration under Section 564(b)(1) of the Act, 21 U.S.C. section 360bbb-3(b)(1), unless the authorization is terminated or revoked.     Resp Syncytial Virus by PCR NEGATIVE NEGATIVE Final    Comment: (NOTE) Fact Sheet for Patients: BloggerCourse.com  Fact Sheet for Healthcare Providers: SeriousBroker.it  This test is not yet approved or cleared by the [place]  FDA and has been authorized for detection and/or diagnosis of SARS-CoV-2 by FDA under an Emergency Use Authorization (EUA). This EUA will remain in effect (meaning this test can be used) for the duration of the COVID-19 declaration under Section 564(b)(1) of the Act, 21 U.S.C. section 360bbb-3(b)(1), unless the authorization is terminated or revoked.  Performed at [facility], [address], [place], [place] [address]   Blood Culture (routine x 2)     Status: None (Preliminary result)   Collection Time: 08/31/23  2:15 PM   Specimen: BLOOD LEFT WRIST  Result Value Ref Range Status   Specimen Description BLOOD LEFT WRIST  Final   Special Requests   Final    BOTTLES DRAWN AEROBIC AND ANAEROBIC Blood Culture adequate volume   Culture   Final    NO GROWTH 3 DAYS Performed at [facility], [address] [place], [place] [address]    Report Status PENDING  Incomplete  Blood Culture (routine x 2)     Status: None (Preliminary result)   Collection Time: 08/31/23  2:20 PM   Specimen: BLOOD RIGHT FOREARM  Result Value Ref Range Status   Specimen Description BLOOD RIGHT FOREARM  Final   Special Requests   Final    BOTTLES DRAWN AEROBIC AND ANAEROBIC Blood Culture adequate volume   Culture   Final    NO GROWTH 3 DAYS Performed at [facility], [address], [place], [place] [address]    Report Status PENDING  Incomplete  MRSA Next  Gen by PCR, Nasal     Status: Abnormal   Collection Time: 08/31/23 10:29 PM  Result Value Ref Range Status   MRSA by PCR Next Gen DETECTED (A) NOT DETECTED Final    Comment: RESULT CALLED TO, READ BACK BY AND VERIFIED WITH: SIMON RN 09/01/2023 @ 0202 (NOTE) The GeneXpert MRSA Assay (  FDA approved for NASAL specimens only), is one component of a comprehensive MRSA colonization surveillance program. It is not intended to diagnose MRSA infection nor to guide or monitor treatment for MRSA infections. Test performance is not FDA approved in patients less than 71 years old. Performed at [facility], [address] [place], [place] [address]   Respiratory (~20 pathogens) panel by PCR     Status: None   Collection Time: 09/01/23  3:34 AM   Specimen: Urine, Clean Catch; Respiratory  Result Value Ref Range Status   Adenovirus NOT DETECTED NOT DETECTED Final   Coronavirus 229E NOT DETECTED NOT DETECTED Final    Comment: (NOTE) The Coronavirus on the Respiratory Panel, DOES NOT test for the novel  Coronavirus (2019 nCoV)    Coronavirus HKU1 NOT DETECTED NOT DETECTED Final   Coronavirus NL63 NOT DETECTED NOT DETECTED Final   Coronavirus OC43 NOT DETECTED NOT DETECTED Final   Metapneumovirus NOT DETECTED NOT DETECTED Final   Rhinovirus / Enterovirus NOT DETECTED NOT DETECTED Final   Influenza A NOT DETECTED NOT DETECTED Final   Influenza B NOT DETECTED NOT DETECTED Final   Parainfluenza Virus 1 NOT DETECTED NOT DETECTED Final   Parainfluenza Virus 2 NOT DETECTED NOT DETECTED Final   Parainfluenza Virus 3 NOT DETECTED NOT DETECTED Final   Parainfluenza Virus 4 NOT DETECTED NOT DETECTED Final   Respiratory Syncytial Virus NOT DETECTED NOT DETECTED Final   Bordetella pertussis NOT DETECTED NOT DETECTED Final   Bordetella Parapertussis NOT DETECTED NOT DETECTED Final   Chlamydophila pneumoniae NOT DETECTED NOT DETECTED Final   Mycoplasma pneumoniae NOT DETECTED NOT DETECTED Final     Comment: Performed at [facility], [address], [place], [place] [address]     Labs:  CBC: Recent Labs  Lab 08/31/23 1057 09/01/23 0621 09/02/23 0625 09/03/23 0626  WBC 18.5* 7.6 5.8 4.9  NEUTROABS 16.3*  --   --   --   HGB 12.8 10.7* 11.2* 11.7*  HCT 40.0 33.2* 34.7* 36.7  MCV 96.9 95.7 97.5 97.1  PLT 262 198 228 257   BMP &GFR Recent Labs  Lab 08/31/23 1057 09/01/23 0621 09/01/23 1208 09/02/23 0625 09/03/23 0626  NA 136 137  --  141 139  K 3.2* 3.0*  --  3.6 3.9  CL 101 102  --  103 102  CO2 24 26  --  28 28  GLUCOSE 157* 115*  --  123* 111*  BUN 18 11  --  8 6*  CREATININE 0.87 0.55  --  0.52 0.50  CALCIUM 9.3 8.5*  --  8.4* 8.8*  MG  --   --  1.3* 2.2 1.9   Estimated Creatinine Clearance: 66.6 mL/min (by C-G formula based on SCr of 0.5 mg/dL). Liver & Pancreas: Recent Labs  Lab 08/31/23 1057 09/01/23 0621  AST 23 16  ALT 35 24  ALKPHOS 55 46  BILITOT 1.2 1.1  PROT 6.3* 5.1*  ALBUMIN  3.0* 2.4*   No results for input(s): LIPASE, AMYLASE in the last 168 hours. No results for input(s): AMMONIA in the last 168 hours. Diabetic: No results for input(s): HGBA1C in the last 72 hours. No results for input(s): GLUCAP in the last 168 hours. Cardiac Enzymes: No results for input(s): CKTOTAL, CKMB, CKMBINDEX, TROPONINI in the last 168 hours. No results for input(s): PROBNP in the last 8760 hours. Coagulation Profile: Recent Labs  Lab 08/31/23 1401 09/01/23 0621  INR 2.1* 1.9*   Thyroid  Function  Tests: No results for input(s): TSH, T4TOTAL, FREET4, T3FREE, THYROIDAB in the last 72 hours. Lipid Profile: No results for input(s): CHOL, HDL, LDLCALC, TRIG, CHOLHDL, LDLDIRECT in the last 72 hours. Anemia Panel: No results for input(s): VITAMINB12, FOLATE, FERRITIN, TIBC, IRON, RETICCTPCT in the last 72 hours. Urine analysis:    Component Value Date/Time   COLORURINE YELLOW 08/31/2023 2229    APPEARANCEUR CLEAR 08/31/2023 2229   LABSPEC >1.046 (H) 08/31/2023 2229   PHURINE 5.0 08/31/2023 2229   GLUCOSEU NEGATIVE 08/31/2023 2229   HGBUR NEGATIVE 08/31/2023 2229   BILIRUBINUR NEGATIVE 08/31/2023 2229   KETONESUR NEGATIVE 08/31/2023 2229   PROTEINUR NEGATIVE 08/31/2023 2229   NITRITE NEGATIVE 08/31/2023 2229   LEUKOCYTESUR TRACE (A) 08/31/2023 2229   Sepsis Labs: Invalid input(s): PROCALCITONIN, LACTICIDVEN   SIGNED:  Venicia Vandall T Eiley Mcginnity, MD  [facility] 09/03/2023, 4:22 PM

## 2023-09-03 NOTE — TOC Progression Note (Signed)
 Transition of Care (TOC) - Progression Note   Spoke to [facility] PA in hallway. Plan to discharge patient home on bolus tube feeds. Updated Kelly with [facility].   Pam with [facility] following for tube feeds.   Spoke to patient and daughter at bedside. Explained above. Pam and hospital bedside nurse will provide teaching to patient and Adah Acron at hospital prior to discharge.   RD has been consulted for recommendations for home tube feeds. Once RD makes recommendations will be able to provide order to [facility] with [facility].   Both voiced understanding.   Adah Acron asking to speak to MD regarding her mother's PNA and prescriptions. NCM secure chatted attending    Patient Details  Name: Darlene [name] MRN: [id number] Date of Birth: [birth year]  Transition of Care [facility]) CM/SW Contact  Nemesis Rainwater, Arturo Late, RN Phone Number: 09/03/2023, 9:55 AM  Clinical Narrative:       Expected Discharge Plan: Home w Home Health Services Barriers to Discharge: Continued Medical Work up  Expected Discharge Plan and Services   Discharge Planning Services: CM Consult Post Acute Care Choice: Home Health Living arrangements for the past 2 months: Single Family Home                 DME Arranged: N/A DME Agency: NA       HH Arranged: PT, OT, RN HH Agency: [facility] Date HH Agency Contacted: 09/01/23 Time HH Agency Contacted: 1213 Representative spoke with at [facility] Agency: Confirmed with Loetta Ringer prior to admission patient was active with [facility] for HHRN,PT,OT , patient wants to continue. Will need orders and face to face   Social Determinants of Health (SDOH) Interventions SDOH Screenings   Food Insecurity: No Food Insecurity (08/31/2023)  Housing: Low Risk  (08/31/2023)  Transportation Needs: No Transportation Needs (08/31/2023)  Utilities: Not At Risk (08/31/2023)  Alcohol Screen: Low Risk  (04/28/2019)  Depression (PHQ2-9): Medium Risk (06/15/2023)  Financial Resource Strain: Low Risk   (02/03/2021)  Physical Activity: Inactive (04/28/2019)  Social Connections: Socially Isolated (08/31/2023)  Stress: No Stress Concern Present (04/28/2019)  Tobacco Use: Low Risk  (08/31/2023)    Readmission Risk Interventions     No data to display

## 2023-09-03 NOTE — Care Management Important Message (Signed)
 Important Message  Patient Details  Name: Darlene [name] MRN: [id number] Date of Birth: 17-Jul-1940   Important Message Given:  Yes - Medicare IM     Felix Host 09/03/2023, 12:35 PM

## 2023-09-03 NOTE — Progress Notes (Signed)
 PO intake for pleasure not enough nutritional support. Patient with esophageal dysmotility with severe reflux requiring enteral feedings to sustain pt nutritionally until esophageal motility improves.  Darlene [name] 12:39 PM 09/03/2023

## 2023-09-03 NOTE — Progress Notes (Signed)
 Physical Therapy Treatment Patient Details Name: Darlene [name] MRN: [id number] DOB: [birth year] Today's Date: 09/03/2023   History of Present Illness 83 yo female admitted 6/17 for PNA, had prolonged ICU stay in April 2025 with GI bleed with aspiration, S/P hernia repair & G-tube placement 07/21/23. PMhx: depression, DJD, OSA, pulmonary HTN, obesity, HLD, Afib    PT Comments  Patient resting in bed and agreeable to mobilize, eager to improve functional endurance and mobility. Supervision for bed mobility with pt taking some extra time. Pt able to power up for sit<>stand from varying surface heights and compliance with CGA fading to supervision with use of hands to initiate rise. Pt amb ~150' with no overt LOB and no c/o dizziness/lightheadedness, CGA fading to supervision with RW. EOS discussed goal of toileting without purewick and BSC placed adjacent to pt for chair<>BSC via SPT with RW. Daughter present and able to provide safe guarding for transfer. Pt educated to call for staff to assist but ok to transfer with daughter present if unable to hold bladder. Will continue to progress pt as able during acute stay.     If plan is discharge home, recommend the following: A little help with walking and/or transfers;A little help with bathing/dressing/bathroom;Assistance with cooking/housework;Assist for transportation;Help with stairs or ramp for entrance   Can travel by private vehicle        Equipment Recommendations  None recommended by PT    Recommendations for Other Services       Precautions / Restrictions Precautions Precautions: Fall Recall of Precautions/Restrictions: Intact Restrictions Weight Bearing Restrictions Per Provider Order: No     Mobility  Bed Mobility Overal bed mobility: Needs Assistance Bed Mobility: Supine to Sit     Supine to sit: HOB elevated, Used rails, Supervision     General bed mobility comments: sup for safety    Transfers Overall transfer  level: Needs assistance Equipment used: Rolling walker (2 wheels) Transfers: Sit to/from Stand Sit to Stand: From elevated surface, Contact guard assist, Supervision           General transfer comment: EOB slightly elevated, pt able to rise from bed, recliner, and BSC with CGA fading to supervision, reliant on hands to power up    Ambulation/Gait Ambulation/Gait assistance: Contact guard assist, Supervision Gait Distance (Feet): 150 Feet Assistive device: Rolling walker (2 wheels) Gait Pattern/deviations: Step-through pattern, Decreased stride length Gait velocity: decr     General Gait Details: slow cautious pace, cues for position to RW and pt maintianed throughout. no overt LOB. pt denied dizziness throughout.   Stairs             Wheelchair Mobility     Tilt Bed    Modified Rankin (Stroke Patients Only)       Balance Overall balance assessment: Needs assistance Sitting-balance support: No upper extremity supported, Feet supported Sitting balance-Leahy Scale: Good     Standing balance support: No upper extremity supported, During functional activity Standing balance-Leahy Scale: Fair Standing balance comment: able to stand unsupported, RW for dynamic stability and safety due to poor activity tolerance                            Communication Communication Communication: No apparent difficulties  Cognition Arousal: Alert Behavior During Therapy: WFL for tasks assessed/performed   PT - Cognitive impairments: No apparent impairments  Following commands: Intact      Cueing Cueing Techniques: Verbal cues  Exercises      General Comments        Pertinent Vitals/Pain Pain Assessment Pain Assessment: No/denies pain    Home Living                          Prior Function            PT Goals (current goals can now be found in the care plan section) Acute Rehab PT Goals Patient Stated  Goal: get home PT Goal Formulation: With patient Time For Goal Achievement: 09/15/23 Potential to Achieve Goals: Good Progress towards PT goals: Progressing toward goals    Frequency    Min 2X/week      PT Plan      Co-evaluation              AM-PAC PT 6 Clicks Mobility   Outcome Measure  Help needed turning from your back to your side while in a flat bed without using bedrails?: A Little Help needed moving from lying on your back to sitting on the side of a flat bed without using bedrails?: A Little Help needed moving to and from a bed to a chair (including a wheelchair)?: A Little Help needed standing up from a chair using your arms (e.g., wheelchair or bedside chair)?: A Little Help needed to walk in hospital room?: A Little Help needed climbing 3-5 steps with a railing? : A Lot 6 Click Score: 17    End of Session Equipment Utilized During Treatment: Gait belt Activity Tolerance: Patient tolerated treatment well Patient left: in chair;with call bell/phone within reach;with family/visitor present Nurse Communication: Mobility status PT Visit Diagnosis: Other abnormalities of gait and mobility (R26.89);Muscle weakness (generalized) (M62.81);Difficulty in walking, not elsewhere classified (R26.2)     Time: 1610-9604 PT Time Calculation (min) (ACUTE ONLY): 18 min  Charges:    $Gait Training: 8-22 mins PT General Charges $$ ACUTE PT VISIT: 1 Visit                     Tish Forge, DPT Acute Rehabilitation Services Office [phone]  09/03/23 8:48 AM

## 2023-09-03 NOTE — Progress Notes (Addendum)
 Initial Nutrition Assessment  DOCUMENTATION CODES:   Non-severe (moderate) malnutrition in context of acute illness/injury  INTERVENTION:   Begin bolus G-tube feedings (anticipate long term need): Osmolite 1.5 (or equivalent) 300 ml QID at 8am, 12pm, 4pm, 8pm. Prosource TF20 (or equivalent) 60 ml BID. This provides 1935 kcal, 115 gm protein, 905 ml free water  daily. Free water  flushes 125 ml before and after each bolus for a total of 1905 ml free water  daily.  Continue PO diet as tolerated for pleasure, POs are meeting < 10% of minimum estimated nutrition needs.  NUTRITION DIAGNOSIS:   Moderate Malnutrition related to acute illness (recent paraesophageal hernia repair with gastroplexy & G-tube placement) as evidenced by mild muscle depletion, mild fat depletion, percent weight loss (7.5% weight loss x 3 months).  GOAL:   Patient will meet greater than or equal to 90% of their needs  MONITOR:   PO intake, TF tolerance  REASON FOR ASSESSMENT:   Consult Enteral/tube feeding initiation and management  ASSESSMENT:   83 yo female admitted with CAP, N/V. PMH includes DJD, chronic lower back pain, depression, GERD, anxiety, HTN, paraesophageal hernia repair, gastropexy,  and G-tube placement 07/21/23.  Patient has not been using her G-tube at home. She was eating well after discharge home until she developed PNA and intake has been minimal since. She was drinking supplements (Ensure, Ensure Plus, Muscle Milk mixed with 2% lactose free milk).  Since admission, patient has been eating mostly broth, yogurt, jello, and pudding. For the past 24 hours, intake has been 179 kcal and 8 gm protein. This meets 8% of kcal needs and 9% of protein needs.    Labs reviewed.  Medications reviewed and include acidophilus, ferrous sulfate , lasix , protonix , IV antibiotics.  Weight history reviewed. Patient with 7.5% weight loss within the past 3 months, which is significant.   Patient meets criteria  for moderate malnutrition, given mild depletion of muscle and subcutaneous fat mass with significant weight loss.  NUTRITION - FOCUSED PHYSICAL EXAM:  Flowsheet Row Most Recent Value  Orbital Region Mild depletion  Upper Arm Region Mild depletion  Thoracic and Lumbar Region No depletion  Buccal Region Mild depletion  Temple Region Moderate depletion  Clavicle Bone Region Mild depletion  Clavicle and Acromion Bone Region Mild depletion  Scapular Bone Region Mild depletion  Dorsal Hand Mild depletion  Patellar Region No depletion  Anterior Thigh Region No depletion  Posterior Calf Region No depletion  Edema (RD Assessment) Mild  Hair Other (Comment)  [thin, thinning of hair noted by patient's daughter]  Eyes Reviewed  Mouth Reviewed  Skin Reviewed  Nails Reviewed    Diet Order:   Diet Order             Diet full liquid Fluid consistency: Thin  Diet effective now                   EDUCATION NEEDS:   Education needs have been addressed  Skin:  Skin Assessment: Reviewed RN Assessment  Last BM:  6/17  Height:   Ht Readings from Last 1 Encounters:  08/31/23 5' 10 (1.778 m)    Weight:   Wt Readings from Last 1 Encounters:  08/31/23 95.3 kg    Ideal Body Weight:  68.2 kg  BMI:  Body mass index is 30.13 kg/m.  Estimated Nutritional Needs:   Kcal:  1478-2956  Protein:  100-120 gm  Fluid:  >2 L   Barnet Boots RD, LDN, CNSC Contact via secure chat.  If unavailable, use group chat RD Inpatient.

## 2023-09-05 LAB — CULTURE, BLOOD (ROUTINE X 2)
Culture: NO GROWTH
Culture: NO GROWTH
Special Requests: ADEQUATE
Special Requests: ADEQUATE

## 2023-09-06 ENCOUNTER — Telehealth: Payer: Self-pay

## 2023-09-06 NOTE — Telephone Encounter (Signed)
 Copied from CRM [id number]. Topic: Clinical - Home Health Verbal Orders >> Sep 06, 2023 12:43 PM Gustabo D wrote: Caller/Agency: [facility]  nurse Sharlet Cleaves  Callback Number: [phone].  Service Requested: Skilled Nursing Frequency: - 1 week 5, 1 month 1, 2 PRN as needed visit. Patient was started back on tube feedings  Any new concerns about the patient? No

## 2023-09-06 NOTE — Transitions of Care (Post Inpatient/ED Visit) (Signed)
 09/06/2023  Name: Darlene [name] MRN: [id number] DOB: [birth year]  Today's TOC FU Call Status: Today's TOC FU Call Status:: Successful TOC FU Call Completed TOC FU Call Complete Date: 09/06/23 Patient's Name and Date of Birth confirmed.  Transition Care Management Follow-up Telephone Call Date of Discharge: 09/03/23 Discharge Facility: [facility]) Type of Discharge: Inpatient Admission Primary Inpatient Discharge Diagnosis:: sepsis pneumonia How have you been since you were released from the hospital?: Better Any questions or concerns?: No  Items Reviewed: Did you receive and understand the discharge instructions provided?: Yes Medications obtained,verified, and reconciled?: Yes (Medications Reviewed) Any new allergies since your discharge?: No Dietary orders reviewed?: Yes Do you have support at home?: Yes People in Home [RPT]: child(ren), adult  Medications Reviewed Today: Medications Reviewed Today     Reviewed by Emmitt Pan, LPN (Licensed Practical Nurse) on 09/06/23 at [phone]  Med List Status: <None>   Medication Order Taking? Sig Documenting Provider Last Dose Status Informant  acetaminophen  (TYLENOL ) 500 MG tablet [id number] Yes Take 500 mg by mouth every 6 (six) hours as needed for mild pain (pain score 1-3). [provider]  Active Child  albuterol  (VENTOLIN  HFA) 108 (90 Base) MCG/ACT inhaler [id number] Yes Inhale 2 puffs into the lungs every 6 (six) hours as needed for wheezing or shortness of breath. Laurice President, NP  Active Child  amiodarone  (PACERONE ) 200 MG tablet [id number] Yes Take 1 tablet (200 mg total) by mouth daily. Tobie Yetta CHRISTELLA, MD  Active Child  apixaban  (ELIQUIS ) 5 MG TABS tablet [id number] Yes TAKE 1 TABLET BY MOUTH TWICE  DAILY Branch, Dorn FALCON, MD  Active Child  Ascorbic Acid (VITAMIN C) 500 MG CAPS [id number] Yes Take 500 mg by mouth.  [provider]  Active Child  busPIRone  (BUSPAR ) 5 MG tablet [id number] Yes Take 1  tablet (5 mg total) by mouth 3 (three) times daily. Tobie Yetta CHRISTELLA, MD  Active Child  CHOLECALCIFEROL PO [id number] Yes Take 5,000 Units by mouth daily. [provider]  Active Child           Med Note [facility], [facility]   Wed Jul 31, 2020  2:18 PM)    Cyanocobalamin  2500 MCG TABS [id number] Yes Take 2,500 mcg by mouth once a week. [provider]  Active Child           Med Note (SATTERFIELD, TEENA BRAVO   Tue Aug 31, 2023  7:29 PM) Take on Fridays  docusate (COLACE) 50 MG/5ML liquid [id number] Yes Take 10 mLs (100 mg total) by mouth 2 (two) times daily.  Patient taking differently: Take 100 mg by mouth daily as needed for mild constipation.   Tobie Yetta CHRISTELLA, MD  Active Child  escitalopram  (LEXAPRO ) 20 MG tablet [id number] Yes TAKE 1 TABLET BY MOUTH DAILY FOR MOOD, ANXIETY &amp; IRRITABILITY Wilkinson, Dana E, FNP  Active Child  ferrous sulfate  325 (65 FE) MG tablet [id number] Yes Take 1 tablet (325 mg total) by mouth every other day. Jeanine Knee, NP  Active Child  furosemide  (LASIX ) 20 MG tablet [id number] Yes Take 1 tablet (20 mg total) by mouth daily. Tobie Yetta CHRISTELLA, MD  Active Child  loratadine (CLARITIN) 10 MG tablet [id number] Yes Take 10 mg by mouth daily. [provider]  Active   metoCLOPramide  (REGLAN ) 10 MG tablet [id number] Yes Take 1 tablet (10 mg total) by mouth 4 (four) times daily -  before meals and at bedtime.  Patient taking differently: Take 10 mg by  mouth every 6 (six) hours as needed for nausea.   Tobie Yetta HERO, MD  Active Child  metoprolol  succinate (TOPROL -XL) 50 MG 24 hr tablet [id number] Yes Take 1 tablet (50 mg total) by mouth daily. Gonfa, Taye T, MD  Active   nitroGLYCERIN  (NITROSTAT ) 0.4 MG SL tablet [id number] Yes Place 1 tablet (0.4 mg total) under the tongue every 5 (five) minutes as needed for chest pain. Johnson Laymon HERO, PA-C  Active Child           Med Note (SATTERFIELD, TEENA BRAVO   Tue Aug 31, 2023  7:31 PM)    Nutritional  Supplements (FEEDING SUPPLEMENT, OSMOLITE 1.5 CAL,) LIQD [id number] Yes Place 300 mLs into feeding tube 4 (four) times daily. Gonfa, Taye T, MD  Active   pantoprazole  (PROTONIX ) 40 MG tablet [id number] Yes Take 1 tablet (40 mg total) by mouth 2 (two) times daily. Take  1 tablet  Daily  to prevent  Indigestion & Acid Reflux Dorrell, Robert, MD  Active Child  Probiotic Product (PROBIOTIC DAILY PO) [id number] Yes Take 1 tablet by mouth daily. [provider]  Active Child  Protein (FEEDING SUPPLEMENT, PROSOURCE TF20,) liquid [id number] Yes Place 60 mLs into feeding tube 2 (two) times daily. Gonfa, Taye T, MD  Active   traMADol  (ULTRAM ) 50 MG tablet [id number] Yes Take 1 tablet (50 mg total) by mouth every 6 (six) hours as needed for severe pain (pain score 7-10). Tobie Yetta HERO, MD  Active Child  Water  For Irrigation, Sterile (FREE WATER ) SOLN [id number] Yes Place 250 mLs into feeding tube 4 (four) times daily. Kathrin Mignon DASEN, MD  Active             Home Care and Equipment/Supplies: Were Home Health Services Ordered?: Yes Name of Home Health Agency:: [facility] Has Agency set up a time to come to your home?: Yes First Home Health Visit Date: 09/06/23 Any new equipment or medical supplies ordered?: NA  Functional Questionnaire: Do you need assistance with bathing/showering or dressing?: Yes Do you need assistance with meal preparation?: Yes Do you need assistance with eating?: No Do you have difficulty maintaining continence: Yes Do you need assistance with getting out of bed/getting out of a chair/moving?: No Do you have difficulty managing or taking your medications?: Yes  Follow up appointments reviewed: PCP Follow-up appointment confirmed?: Yes Date of PCP follow-up appointment?: 09/13/23 Follow-up Provider: [facility] Follow-up appointment confirmed?: Yes Date of Specialist follow-up appointment?: 09/15/23 Follow-Up Specialty Provider:: surgeon Do you need  transportation to your follow-up appointment?: No Do you understand care options if your condition(s) worsen?: Yes-patient verbalized understanding    SIGNATURE Julian Lemmings, LPN [facility] Nurse Health Advisor Direct Dial [phone]

## 2023-09-07 ENCOUNTER — Telehealth: Payer: Self-pay

## 2023-09-07 NOTE — Telephone Encounter (Signed)
 Copied from CRM [id number]. Topic: Clinical - Home Health Verbal Orders >> Sep 07, 2023  9:09 AM Treva T wrote: Caller/Agency: Harlene, with [facility]  Callback Number: [phone], secured line Service Requested: Physical Therapy Frequency: Once a week for 5 weeks  Any new concerns about the patient? No

## 2023-09-07 NOTE — Telephone Encounter (Signed)
 Therapy confirmed, therapy authorized w/ Malorie.

## 2023-09-07 NOTE — Telephone Encounter (Signed)
 Spoke to [place] w/ SNF. Advised that Physician has approved therapy orders, and that Physician is aware of feeding situation.

## 2023-09-08 ENCOUNTER — Telehealth: Payer: Self-pay | Admitting: Family Medicine

## 2023-09-08 ENCOUNTER — Other Ambulatory Visit: Payer: Self-pay

## 2023-09-08 MED ORDER — PANTOPRAZOLE SODIUM 40 MG PO TBEC: 40.0000 mg | DELAYED_RELEASE_TABLET | Freq: Two times a day (BID) | ORAL | 3 refills | Status: DC

## 2023-09-08 NOTE — Telephone Encounter (Signed)
 Prescription Request  09/08/2023  LOV: 06/15/2023  What is the name of the medication or equipment?   pantoprazole  (PROTONIX ) 40 MG tablet [[id number]]   Have you contacted your pharmacy to request a refill? Yes   Which pharmacy would you like this sent to?  [facility] [place], [place] - [address] [facility] [address] [place] [address] [place]  [address] Phone: [phone] Fax: [phone]    Patient notified that their request is being sent to the clinical staff for review and that they should receive a response within 2 business days.   Please advise pharmacist.

## 2023-09-09 NOTE — Telephone Encounter (Signed)
Stacey informed. 

## 2023-09-13 ENCOUNTER — Ambulatory Visit (INDEPENDENT_AMBULATORY_CARE_PROVIDER_SITE_OTHER): Admitting: Family Medicine

## 2023-09-13 ENCOUNTER — Encounter: Payer: Self-pay | Admitting: Family Medicine

## 2023-09-13 VITALS — BP 126/84 | HR 88 | Temp 98.2°F | Ht 70.0 in | Wt 209.0 lb

## 2023-09-13 DIAGNOSIS — R197 Diarrhea, unspecified: Secondary | ICD-10-CM

## 2023-09-13 DIAGNOSIS — F32A Depression, unspecified: Secondary | ICD-10-CM

## 2023-09-13 DIAGNOSIS — Z9889 Other specified postprocedural states: Secondary | ICD-10-CM | POA: Diagnosis not present

## 2023-09-13 DIAGNOSIS — Z8719 Personal history of other diseases of the digestive system: Secondary | ICD-10-CM

## 2023-09-13 DIAGNOSIS — A419 Sepsis, unspecified organism: Secondary | ICD-10-CM | POA: Diagnosis not present

## 2023-09-13 DIAGNOSIS — G8929 Other chronic pain: Secondary | ICD-10-CM

## 2023-09-13 DIAGNOSIS — M170 Bilateral primary osteoarthritis of knee: Secondary | ICD-10-CM

## 2023-09-13 DIAGNOSIS — F419 Anxiety disorder, unspecified: Secondary | ICD-10-CM

## 2023-09-13 DIAGNOSIS — E119 Type 2 diabetes mellitus without complications: Secondary | ICD-10-CM | POA: Diagnosis not present

## 2023-09-13 DIAGNOSIS — J690 Pneumonitis due to inhalation of food and vomit: Secondary | ICD-10-CM

## 2023-09-13 DIAGNOSIS — Z48815 Encounter for surgical aftercare following surgery on the digestive system: Secondary | ICD-10-CM | POA: Diagnosis not present

## 2023-09-13 DIAGNOSIS — I4891 Unspecified atrial fibrillation: Secondary | ICD-10-CM | POA: Diagnosis not present

## 2023-09-13 DIAGNOSIS — D649 Anemia, unspecified: Secondary | ICD-10-CM | POA: Diagnosis not present

## 2023-09-13 DIAGNOSIS — I10 Essential (primary) hypertension: Secondary | ICD-10-CM

## 2023-09-13 DIAGNOSIS — J189 Pneumonia, unspecified organism: Secondary | ICD-10-CM

## 2023-09-13 DIAGNOSIS — G4731 Primary central sleep apnea: Secondary | ICD-10-CM

## 2023-09-13 DIAGNOSIS — M545 Low back pain, unspecified: Secondary | ICD-10-CM

## 2023-09-13 DIAGNOSIS — J9601 Acute respiratory failure with hypoxia: Secondary | ICD-10-CM | POA: Diagnosis not present

## 2023-09-13 NOTE — Progress Notes (Unsigned)
 Patient Office Visit  Assessment & Plan:  Sepsis due to pneumonia (HCC) -     CBC with Differential/Platelet -     Comprehensive metabolic panel with GFR  Diarrhea, unspecified type -     C. difficile GDH and Toxin A/B  Atrial fibrillation, unspecified type (HCC)  S/P repair of paraesophageal hernia  Normocytic anemia   Assessment and Plan    Severe sepsis due to pneumonia Improved aeration on CT. Persistent cough and occasional wheezing. On clear liquid diet with tube feeding. - Order CBC and CMP. - Consider chest x-ray if symptoms persist or worsen. - Continue clear liquid diet with tube feeding. - Ensure follow-up with surgeon for feeding tube.  Diarrhea Severe diarrhea likely due to Augmentin . Possible C. diff infection due to antibiotic use. - Order C. diff test. - Adjust treatment based on symptoms.  Atrial fibrillation with episodes of atrial flutter Tachycardia likely exacerbated by pneumonia. On Eliquis  and increased Toprol  XL. - Ensure follow-up with cardiologist on July 22nd. - Continue Eliquis  and Toprol  XL.  Hernia Hernia may contribute to nausea and vomiting. Noted on CT scan. - Ensure follow-up with surgeon.  Non-compliance with CPAP therapy Non-compliance due to difficulty sleeping with device. Discussed alternatives. - Discuss CPAP compliance and alternatives.     Test results were reviewed and analyzed as part of the medical decision making of this visit. Reviewed recent hospital notes and diagnostic studies at [facility] during this office visit.  Patient does have upcoming surgery appointment and cardiology appointment.   Return if symptoms worsen or fail to improve.   Subjective:    Patient ID: Darlene [name] Alert, female    DOB: 1940/03/28  Age: 83 y.o. MRN: [id number]  Chief Complaint  Patient presents with   Hospitalization Follow-up    HPI Discussed the use of AI scribe software for clinical note transcription with the  patient, who gave verbal consent to proceed.  History of Present Illness   Darlene [name] is an 83 year old female with atrial fibrillation and recent hospitalization for severe sepsis due to pneumonia who presents for follow-up after discharge.  She was admitted to the hospital on June 17th and discharged on June 20th for severe sepsis due to pneumonia. During her hospital stay, she experienced weakness, fatigue, and low oxygen levels, with oxygen saturation noted to be in the 'eighty something' range. She was not confused but felt 'slower' than usual. Since discharge, she feels about 40% better.  She has a history of atrial fibrillation, with episodes of atrial flutter noted during her hospitalization. Her heart rate was very fast during these episodes. She is currently taking Eliquis  and Toprol  XL, which was increased to 50 mg during her hospital stay.  She experiences nausea and vomiting, which she associates with coughing. She is on a clear liquid diet and receives supplemental feeding through a feeding tube three times a day. Vomiting is less frequent now as she is not eating solid foods.  She has been experiencing severe diarrhea since her hospital stay, which began while she was still admitted. The diarrhea is described as 'bad' and uncontrollable at times. She was on multiple antibiotics during her hospital stay, including Ceftriaxone , Zithromax , and was discharged with Augmentin , which may be contributing to her diarrhea.  She reports ongoing weakness and fatigue, impacting her ability to perform activities such as shopping. She attempted to visit a thrift store but was unable to continue due to fatigue. She is able to  perform some activities at home, such as retrieving food from the refrigerator.  She has a history of gallstones noted on a CT scan, but has not experienced any gallbladder attacks. She also has a history of kidney stones.  She reports ongoing coughing, which can lead to  vomiting. She has albuterol  available for wheezing, which she uses as needed. She is not compliant with CPAP use and is currently sleeping in a recliner to be closer to the bathroom.  She has been receiving home health services and physical therapy since her discharge. Her energy levels remain low, and she is described as still very weak.       Patient does have a history of anemia for some time now.  Severe sepsis due to pneumonia slowly improving- Improved aeration on CT. Persistent cough and occasional wheezing. On clear liquid diet with tube feeding. - Order CBC and CMP. - Consider chest x-ray if symptoms persist or worsen. - Continue clear liquid diet with tube feeding. - Ensure follow-up with surgeon for feeding tube.  Diarrhea Severe diarrhea likely due to Augmentin . Possible C. diff infection due to antibiotic use. - Order C. diff test. - Adjust treatment based on symptoms.  Atrial fibrillation with episodes of atrial flutter Tachycardia likely exacerbated by pneumonia. On Eliquis  and increased Toprol  XL. - Ensure follow-up with cardiologist on July 22nd. - Continue Eliquis  and Toprol  XL.  Hernia Hernia may contribute to nausea and vomiting. Noted on CT scan. - Ensure follow-up with surgeon.  Non-compliance with CPAP therapy Non-compliance due to difficulty sleeping with device. Discussed alternatives. - Discuss CPAP compliance and alternatives.        The ASCVD Risk score (Arnett DK, et al., 2019) failed to calculate for the following reasons:   The 2019 ASCVD risk score is only valid for ages 73 to 47  Past Medical History:  Diagnosis Date   Anxiety    Chronic lower back pain    Depression    DJD (degenerative joint disease)    knees, neck   GERD (gastroesophageal reflux disease)    has had dilitation in the past   Hypertension    Past Surgical History:  Procedure Laterality Date   ABDOMINAL HYSTERECTOMY  03/1979   BREAST BIOPSY Left 1986 and 1987    CATARACT EXTRACTION, BILATERAL Bilateral 2014   Dr. Patrina, in [place]   ESOPHAGOGASTRODUODENOSCOPY N/A 07/01/2023   Procedure: EGD (ESOPHAGOGASTRODUODENOSCOPY);  Surgeon: Federico Rosario BROCKS, MD;  Location: [facility] ENDOSCOPY;  Service: Gastroenterology;  Laterality: N/A;  Sedation provided by ICU   [facility]   INSERTION, GASTROSTOMY TUBE, ROBOT-ASSISTED Left 07/21/2023   Procedure: INSERTION, GASTROSTOMY TUBE, ROBOT-ASSISTED;  Surgeon: Signe Mitzie LABOR, MD;  Location: [facility] OR;  Service: General;  Laterality: Left;   XI ROBOTIC ASSISTED PARAESOPHAGEAL HERNIA REPAIR N/A 07/21/2023   Procedure: REPAIR, HERNIA, PARAESOPHAGEAL, ROBOT-ASSISTED;  Surgeon: Signe Mitzie LABOR, MD;  Location: [facility] OR;  Service: General;  Laterality: N/A;  ROBOTIC PARAESOPHAGEAL HERNIA REPAIR, ROBOTIC REPAIR OF SPIGELIAN HERNIA   Social History   Tobacco Use   Smoking status: Never   Smokeless tobacco: Never  Vaping Use   Vaping status: Never Used  Substance Use Topics   Alcohol use: Yes    Alcohol/week: 3.0 standard drinks of alcohol    Types: 3 Standard drinks or equivalent per week    Comment: 3 nights out of the week   Drug use: No   Family History  Problem Relation Age of Onset   Hypertension Mother    Heart disease Mother    Hypertension Father    Heart disease Father    Tremor Sister    Dementia Brother    Hypertension Daughter    Colon cancer Neg Hx    Esophageal cancer Neg Hx    Liver disease Neg Hx    Allergies  Allergen Reactions   Cephalosporins Rash    Tolerated ceftriaxone  06/2023   Keflex [Cephalexin] Rash    ROS    Objective:    BP 126/84   Pulse 88   Temp 98.2 F (36.8 C)   Ht 5' 10 (1.778 m)   Wt 209 lb (94.8 kg)   SpO2 98%   BMI 29.99 kg/m  BP Readings from Last 3 Encounters:  09/13/23 126/84  09/03/23 (!) 108/48  08/31/23 102/69   Wt Readings from Last 3 Encounters:  09/13/23 209 lb (94.8 kg)  08/31/23 210 lb (95.3 kg)   08/17/23 213 lb (96.6 kg)    Physical Exam Vitals and nursing note reviewed.  Constitutional:      Appearance: Normal appearance.     Comments: Comes in with her daughter, using cane on today's visit.   HENT:     Head: Normocephalic.     Right Ear: Tympanic membrane, ear canal and external ear normal.     Left Ear: Tympanic membrane, ear canal and external ear normal.   Eyes:     Extraocular Movements: Extraocular movements intact.     Pupils: Pupils are equal, round, and reactive to light.    Cardiovascular:     Rate and Rhythm: Normal rate and regular rhythm.     Heart sounds: Normal heart sounds.  Pulmonary:     Effort: Pulmonary effort is normal.     Breath sounds: Normal breath sounds.   Musculoskeletal:     Right lower leg: No edema.     Left lower leg: No edema.   Neurological:     General: No focal deficit present.     Mental Status: She is alert and oriented to person, place, and time.   Psychiatric:        Mood and Affect: Mood normal.        Behavior: Behavior normal.        Judgment: Judgment normal.      Results for orders placed or performed in visit on 09/13/23  CBC with Differential/Platelet  Result Value Ref Range   WBC 8.1 3.8 - 10.8 Thousand/uL   RBC 3.73 (L) 3.80 - 5.10 Million/uL   Hemoglobin 11.5 (L) 11.7 - 15.5 g/dL   HCT 63.4 64.9 - 54.9 %   MCV 97.9 80.0 - 100.0 fL   MCH 30.8 27.0 - 33.0 pg   MCHC 31.5 (L) 32.0 - 36.0 g/dL   RDW 86.4 88.9 - 84.9 %   Platelets 271 140 - 400 Thousand/uL   MPV 11.9 7.5 - 12.5 fL   Neutro Abs 5,848 1,500 - 7,800 cells/uL   Absolute Lymphocytes 1,337 850 - 3,900 cells/uL   Absolute Monocytes 721 200 - 950 cells/uL   Eosinophils Absolute 154 15 - 500 cells/uL   Basophils Absolute 41 0 - 200 cells/uL   Neutrophils Relative % 72.2 %   Total Lymphocyte 16.5 %   Monocytes Relative 8.9 %   Eosinophils Relative 1.9 %   Basophils Relative 0.5 %  Comprehensive metabolic panel with GFR  Result Value Ref  Range   Glucose, Bld  111 (H) 65 - 99 mg/dL   BUN 17 7 - 25 mg/dL   Creat 9.22 9.39 - 9.04 mg/dL   eGFR 76 > OR = 60 fO/fpw/8.26f7   BUN/Creatinine Ratio SEE NOTE: 6 - 22 (calc)   Sodium 139 135 - 146 mmol/L   Potassium 4.6 3.5 - 5.3 mmol/L   Chloride 101 98 - 110 mmol/L   CO2 30 20 - 32 mmol/L   Calcium 9.3 8.6 - 10.4 mg/dL   Total Protein 5.7 (L) 6.1 - 8.1 g/dL   Albumin  3.3 (L) 3.6 - 5.1 g/dL   Globulin 2.4 1.9 - 3.7 g/dL (calc)   AG Ratio 1.4 1.0 - 2.5 (calc)   Total Bilirubin 0.4 0.2 - 1.2 mg/dL   Alkaline phosphatase (APISO) 59 37 - 153 U/L   AST 20 10 - 35 U/L   ALT 12 6 - 29 U/L

## 2023-09-14 ENCOUNTER — Ambulatory Visit: Payer: Self-pay | Admitting: Family Medicine

## 2023-09-14 LAB — CBC WITH DIFFERENTIAL/PLATELET
Absolute Lymphocytes: 1337 {cells}/uL (ref 850–3900)
Absolute Monocytes: 721 {cells}/uL (ref 200–950)
Basophils Absolute: 41 {cells}/uL (ref 0–200)
Basophils Relative: 0.5 %
Eosinophils Absolute: 154 {cells}/uL (ref 15–500)
Eosinophils Relative: 1.9 %
HCT: 36.5 % (ref 35.0–45.0)
Hemoglobin: 11.5 g/dL — ABNORMAL LOW (ref 11.7–15.5)
MCH: 30.8 pg (ref 27.0–33.0)
MCHC: 31.5 g/dL — ABNORMAL LOW (ref 32.0–36.0)
MCV: 97.9 fL (ref 80.0–100.0)
MPV: 11.9 fL (ref 7.5–12.5)
Monocytes Relative: 8.9 %
Neutro Abs: 5848 {cells}/uL (ref 1500–7800)
Neutrophils Relative %: 72.2 %
Platelets: 271 10*3/uL (ref 140–400)
RBC: 3.73 10*6/uL — ABNORMAL LOW (ref 3.80–5.10)
RDW: 13.5 % (ref 11.0–15.0)
Total Lymphocyte: 16.5 %
WBC: 8.1 10*3/uL (ref 3.8–10.8)

## 2023-09-14 LAB — COMPREHENSIVE METABOLIC PANEL WITH GFR
AG Ratio: 1.4 (calc) (ref 1.0–2.5)
ALT: 12 U/L (ref 6–29)
AST: 20 U/L (ref 10–35)
Albumin: 3.3 g/dL — ABNORMAL LOW (ref 3.6–5.1)
Alkaline phosphatase (APISO): 59 U/L (ref 37–153)
BUN: 17 mg/dL (ref 7–25)
CO2: 30 mmol/L (ref 20–32)
Calcium: 9.3 mg/dL (ref 8.6–10.4)
Chloride: 101 mmol/L (ref 98–110)
Creat: 0.77 mg/dL (ref 0.60–0.95)
Globulin: 2.4 g/dL (ref 1.9–3.7)
Glucose, Bld: 111 mg/dL — ABNORMAL HIGH (ref 65–99)
Potassium: 4.6 mmol/L (ref 3.5–5.3)
Sodium: 139 mmol/L (ref 135–146)
Total Bilirubin: 0.4 mg/dL (ref 0.2–1.2)
Total Protein: 5.7 g/dL — ABNORMAL LOW (ref 6.1–8.1)
eGFR: 76 mL/min/{1.73_m2} (ref >=60)

## 2023-09-15 ENCOUNTER — Telehealth: Payer: Self-pay

## 2023-09-15 ENCOUNTER — Other Ambulatory Visit: Payer: Self-pay

## 2023-09-15 ENCOUNTER — Other Ambulatory Visit: Payer: Self-pay | Admitting: Family Medicine

## 2023-09-15 DIAGNOSIS — E782 Mixed hyperlipidemia: Secondary | ICD-10-CM

## 2023-09-15 DIAGNOSIS — I4891 Unspecified atrial fibrillation: Secondary | ICD-10-CM

## 2023-09-15 DIAGNOSIS — F3342 Major depressive disorder, recurrent, in full remission: Secondary | ICD-10-CM

## 2023-09-15 DIAGNOSIS — I119 Hypertensive heart disease without heart failure: Secondary | ICD-10-CM

## 2023-09-15 DIAGNOSIS — I272 Pulmonary hypertension, unspecified: Secondary | ICD-10-CM

## 2023-09-15 DIAGNOSIS — J810 Acute pulmonary edema: Secondary | ICD-10-CM

## 2023-09-15 DIAGNOSIS — D509 Iron deficiency anemia, unspecified: Secondary | ICD-10-CM

## 2023-09-15 DIAGNOSIS — K2100 Gastro-esophageal reflux disease with esophagitis, without bleeding: Secondary | ICD-10-CM

## 2023-09-15 LAB — C. DIFFICILE GDH AND TOXIN A/B
GDH ANTIGEN: NOT DETECTED
MICRO NUMBER:: 16646673
SPECIMEN QUALITY:: ADEQUATE
TOXIN A AND B: NOT DETECTED

## 2023-09-15 MED ORDER — AMIODARONE HCL 200 MG PO TABS: 200.0000 mg | ORAL_TABLET | Freq: Every day | ORAL | 1 refills | Status: DC

## 2023-09-15 MED ORDER — PANTOPRAZOLE SODIUM 40 MG PO TBEC: 40.0000 mg | DELAYED_RELEASE_TABLET | Freq: Two times a day (BID) | ORAL | 3 refills | Status: DC

## 2023-09-15 MED ORDER — BUSPIRONE HCL 5 MG PO TABS: 5.0000 mg | ORAL_TABLET | Freq: Three times a day (TID) | ORAL | 1 refills | Status: AC

## 2023-09-15 MED ORDER — FUROSEMIDE 20 MG PO TABS: 20.0000 mg | ORAL_TABLET | Freq: Every day | ORAL | 1 refills | Status: DC

## 2023-09-15 NOTE — Telephone Encounter (Signed)
 Copied from CRM [id number]. Topic: Clinical - Medication Refill >> Sep 15, 2023  2:50 PM Ivette P wrote: Medication: busPIRone  (BUSPAR ) 5 MG tablet amiodarone  (PACERONE ) 200 MG tablet furosemide  (LASIX ) 20 MG tablet   Has the patient contacted their pharmacy? Yes (Agent: If no, request that the patient contact the pharmacy for the refill. If patient does not wish to contact the pharmacy document the reason why and proceed with request.) (Agent: If yes, when and what did the pharmacy advise?)  This is the patient's preferred pharmacy:  [id number]  [address] [place], [place] [address] Phone: [phone]  Is this the correct pharmacy for this prescription? No If no, delete pharmacy and type the correct one.   Has the prescription been filled recently? No  Is the patient out of the medication? Yes, pt has enough for 2 days  Has the patient been seen for an appointment in the last year OR does the patient have an upcoming appointment? Yes, 09/28/2023  Can we respond through MyChart? Yes  Agent: Please be advised that Rx refills may take up to 3 business days. We ask that you follow-up with your pharmacy.

## 2023-09-15 NOTE — Telephone Encounter (Signed)
 VO to Mallory and she was given with okay to move service. Mjp,lpn   Copied from CRM [id number]. Topic: General - Call Back - No Documentation >> Sep 15, 2023 12:36 PM Geneva B wrote: Reason for CRM: [facility] calling to move pt service to the week of 7/6 call [phone]

## 2023-09-17 NOTE — Telephone Encounter (Signed)
 Requested Prescriptions  Refused Prescriptions Disp Refills   busPIRone  (BUSPAR ) 5 MG tablet 270 tablet 1    Sig: Take 1 tablet (5 mg total) by mouth 3 (three) times daily.     Psychiatry: Anxiolytics/Hypnotics - Non-controlled Passed - 09/17/2023  5:24 PM      Passed - Valid encounter within last 12 months    Recent Outpatient Visits           4 days ago Sepsis due to pneumonia [facility])   [facility] Aletha Bene, MD   1 month ago Acute respiratory failure with hypoxia [facility])   [place] [facility] Aletha Bene, MD   3 months ago Encounter to establish care with new provider   [facility] Kayla Jeoffrey RAMAN, FNP       Future Appointments             In 2 weeks Miriam Norris, NP [facility] at [place], [place]             amiodarone  (PACERONE ) 200 MG tablet 90 tablet 1    Sig: Take 1 tablet (200 mg total) by mouth daily.     Not Delegated - Cardiovascular: Antiarrhythmic Agents - amiodarone  Failed - 09/17/2023  5:24 PM      Failed - This refill cannot be delegated      Failed - Manual Review: Eye exam recommended every 12 months      Passed - TSH in normal range and within 360 days    TSH  Date Value Ref Range Status  07/01/2023 0.599 0.350 - 4.500 uIU/mL Final    Comment:    Performed by a 3rd Generation assay with a functional sensitivity of <=0.01 uIU/mL. Performed at [facility], [address] [place], [place] [address]   02/16/2023 1.47 0.40 - 4.50 mIU/L Final         Passed - Mg Level in normal range and within 360 days    Magnesium   Date Value Ref Range Status  09/03/2023 1.9 1.7 - 2.4 mg/dL Final    Comment:    Performed at [facility], [address], [place], [place] [address]         Passed - K in normal range and within 180 days    Potassium  Date Value Ref Range Status  09/13/2023 4.6 3.5 - 5.3 mmol/L Final         Passed - AST in normal  range and within 180 days    AST  Date Value Ref Range Status  09/13/2023 20 10 - 35 U/L Final         Passed - ALT in normal range and within 180 days    ALT  Date Value Ref Range Status  09/13/2023 12 6 - 29 U/L Final         Passed - Patient had ECG in the last 180 days      Passed - Patient is not pregnant      Passed - Last BP in normal range    BP Readings from Last 1 Encounters:  09/13/23 126/84         Passed - Last Heart Rate in normal range    Pulse Readings from Last 1 Encounters:  09/13/23 88         Passed - Valid encounter within last 6 months    Recent Outpatient Visits  4 days ago Sepsis due to pneumonia [facility])   [facility] Aletha Bene, MD   1 month ago Acute respiratory failure with hypoxia [facility])   [place] [facility] Aletha Bene, MD   3 months ago Encounter to establish care with new provider   [place] [facility] Kayla Jeoffrey RAMAN, FNP       Future Appointments             In 2 weeks Miriam Norris, NP [facility] at [place], [facility] - Patient had chest x-ray within the last 6 months       furosemide  (LASIX ) 20 MG tablet 90 tablet 1    Sig: Take 1 tablet (20 mg total) by mouth daily.     Cardiovascular:  Diuretics - Loop Passed - 09/17/2023  5:24 PM      Passed - K in normal range and within 180 days    Potassium  Date Value Ref Range Status  09/13/2023 4.6 3.5 - 5.3 mmol/L Final         Passed - Ca in normal range and within 180 days    Calcium  Date Value Ref Range Status  09/13/2023 9.3 8.6 - 10.4 mg/dL Final   Calcium, Ion  Date Value Ref Range Status  07/10/2023 1.18 1.15 - 1.40 mmol/L Final         Passed - Na in normal range and within 180 days    Sodium  Date Value Ref Range Status  09/13/2023 139 135 - 146 mmol/L Final         Passed - Cr in normal range and within 180 days    Creat  Date Value  Ref Range Status  09/13/2023 0.77 0.60 - 0.95 mg/dL Final   Creatinine, Urine  Date Value Ref Range Status  02/16/2023 139 20 - 275 mg/dL Final         Passed - Cl in normal range and within 180 days    Chloride  Date Value Ref Range Status  09/13/2023 101 98 - 110 mmol/L Final         Passed - Mg Level in normal range and within 180 days    Magnesium   Date Value Ref Range Status  09/03/2023 1.9 1.7 - 2.4 mg/dL Final    Comment:    Performed at [facility], [address] [place], [place] [address]         Passed - Last BP in normal range    BP Readings from Last 1 Encounters:  09/13/23 126/84         Passed - Valid encounter within last 6 months    Recent Outpatient Visits           4 days ago Sepsis due to pneumonia [facility])   [place] [facility] Medicine Aletha Bene, MD   1 month ago Acute respiratory failure with hypoxia [facility])   [place] [facility] Aletha Bene, MD   3 months ago Encounter to establish care with new provider   [place] [facility] Family Medicine Kayla Jeoffrey RAMAN, FNP       Future Appointments             In 2 weeks Miriam Norris, NP [facility] at [place], [place]

## 2023-09-21 ENCOUNTER — Other Ambulatory Visit: Payer: Self-pay | Admitting: Family Medicine

## 2023-09-21 ENCOUNTER — Telehealth: Payer: Self-pay | Admitting: Family Medicine

## 2023-09-21 DIAGNOSIS — K2100 Gastro-esophageal reflux disease with esophagitis, without bleeding: Secondary | ICD-10-CM

## 2023-09-21 MED ORDER — PANTOPRAZOLE SODIUM 40 MG PO TBEC: 40.0000 mg | DELAYED_RELEASE_TABLET | Freq: Two times a day (BID) | ORAL | Status: DC

## 2023-09-21 NOTE — Telephone Encounter (Signed)
 Need to clarify how patient is to take pantoprazole  (PROTONIX ) 40 MG tablet

## 2023-09-22 ENCOUNTER — Telehealth: Payer: Self-pay

## 2023-09-22 NOTE — Telephone Encounter (Signed)
 Copied from CRM [id number]. Topic: Clinical - Home Health Verbal Orders >> Sep 22, 2023  9:25 AM Powell HERO wrote: Caller/Agency: [facility] Number: [phone] to leave vm Service Requested: Occupational Therapy Frequency: 1w4 Any new concerns about the patient? No

## 2023-09-23 NOTE — Telephone Encounter (Signed)
 Mallory informed

## 2023-09-28 ENCOUNTER — Ambulatory Visit: Admitting: Family Medicine

## 2023-09-28 ENCOUNTER — Encounter: Payer: Self-pay | Admitting: Family Medicine

## 2023-09-28 VITALS — BP 100/78 | HR 97 | Temp 98.0°F | Ht 70.0 in | Wt 210.4 lb

## 2023-09-28 DIAGNOSIS — Z8719 Personal history of other diseases of the digestive system: Secondary | ICD-10-CM

## 2023-09-28 DIAGNOSIS — K2100 Gastro-esophageal reflux disease with esophagitis, without bleeding: Secondary | ICD-10-CM | POA: Diagnosis not present

## 2023-09-28 DIAGNOSIS — Z9889 Other specified postprocedural states: Secondary | ICD-10-CM

## 2023-09-28 DIAGNOSIS — I4891 Unspecified atrial fibrillation: Secondary | ICD-10-CM

## 2023-09-28 DIAGNOSIS — D509 Iron deficiency anemia, unspecified: Secondary | ICD-10-CM | POA: Diagnosis not present

## 2023-09-28 DIAGNOSIS — E559 Vitamin D deficiency, unspecified: Secondary | ICD-10-CM

## 2023-09-28 DIAGNOSIS — E782 Mixed hyperlipidemia: Secondary | ICD-10-CM

## 2023-09-28 MED ORDER — PANTOPRAZOLE SODIUM 40 MG PO TBEC: 40.0000 mg | DELAYED_RELEASE_TABLET | Freq: Two times a day (BID) | ORAL | 1 refills | Status: DC

## 2023-09-28 NOTE — Progress Notes (Unsigned)
 Subjective    Patient ID: Darlene [name], female    DOB: [birth year]  Age: 83 y.o. MRN: [id number]  CC:  Chief Complaint  Patient presents with   Follow-up    6 week f/u needs dexa scan ordered  Issues w/ getting Protonix . Prescription clarification needed.  [facility]     HPI Darlene [name] presents for chronic condition management. Since our initial visit to establish care Darlene [name] was hospitalized for sepsis due to pneumonia. She reports today having recovered well. She continues to see physical therapy.  PMH includes anxiety and depression well controlled on Lexapro  20 mg daily and Buspar  5-10mg  PRN, low back pain and DJD well controlled on PRN flexeril , GERD on Protonix  with upcoming endoscopy and recent discovery of hiatal hernia and spigelian hernia, HLD, afib on Eliquis , diltiazem , and metoprolol , and OSA on CPAP. She does see Gastroenterology, Cardiology, Dermatology, and Neurology.  Anemia: chronic, stable on ferrous sulfate  every other day, no bleeding  Atrial Fibrillation: rate controlled on amiodarone  and eliquis , followed by Cardiology  OSA: followed by Dr Dohmeier, compliant with auto PAP  Esophageal Hernia s/p repair: continues to require tube feedings, optimistic about removal, followed by GI  Depression: well controlled on Lexapro        Health Maintenance  Topic Date Due   DEXA SCAN  Never done   Medicare Annual Wellness (AWV)  08/11/2023   COVID-19 Vaccine (5 - Moderna risk 2024-25 season) 10/14/2023 (Originally 05/30/2023)   Zoster Vaccines- Shingrix  (2 of 2) 12/29/2023 (Originally 08/10/2023)   INFLUENZA VACCINE  10/15/2023   MAMMOGRAM  05/27/2025   DTaP/Tdap/Td (2 - Td or Tdap) 08/30/2032   Pneumococcal Vaccine: 50+ Years  Completed   Hepatitis B Vaccines  Aged Out   HPV VACCINES  Aged Out   Meningococcal B Vaccine  Aged Out   Colonoscopy  Discontinued     Outpatient Encounter Medications as of 09/28/2023  Medication Sig    acetaminophen  (TYLENOL ) 500 MG tablet Take 500 mg by mouth every 6 (six) hours as needed for mild pain (pain score 1-3).   albuterol  (VENTOLIN  HFA) 108 (90 Base) MCG/ACT inhaler Inhale 2 puffs into the lungs every 6 (six) hours as needed for wheezing or shortness of breath.   amiodarone  (PACERONE ) 200 MG tablet Take 1 tablet (200 mg total) by mouth daily.   apixaban  (ELIQUIS ) 5 MG TABS tablet TAKE 1 TABLET BY MOUTH TWICE  DAILY   Ascorbic Acid (VITAMIN C) 500 MG CAPS Take 500 mg by mouth.    busPIRone  (BUSPAR ) 5 MG tablet Take 1 tablet (5 mg total) by mouth 3 (three) times daily.   CHOLECALCIFEROL PO Take 5,000 Units by mouth daily.   Cyanocobalamin  2500 MCG TABS Take 2,500 mcg by mouth once a week.   escitalopram  (LEXAPRO ) 20 MG tablet TAKE 1 TABLET BY MOUTH DAILY FOR MOOD, ANXIETY &amp; IRRITABILITY   ferrous sulfate  325 (65 FE) MG tablet Take 1 tablet (325 mg total) by mouth every other day.   furosemide  (LASIX ) 20 MG tablet Take 1 tablet (20 mg total) by mouth daily.   loratadine (CLARITIN) 10 MG tablet Take 10 mg by mouth daily.   metoCLOPramide  (REGLAN ) 10 MG tablet Take 1 tablet (10 mg total) by mouth 4 (four) times daily -  before meals and at bedtime. (Patient taking differently: Take 10 mg by mouth every 6 (six) hours as needed for nausea.)   metoprolol  succinate (TOPROL -XL) 50 MG 24 hr tablet  Take 1 tablet (50 mg total) by mouth daily.   nitroGLYCERIN  (NITROSTAT ) 0.4 MG SL tablet Place 1 tablet (0.4 mg total) under the tongue every 5 (five) minutes as needed for chest pain.   NUTRITIONAL SUPPLEMENTS PO 8.45 oz by Feeding Tube route in the morning, at noon, in the evening, and at bedtime.   Probiotic Product (PROBIOTIC DAILY PO) Take 1 tablet by mouth daily.   traMADol  (ULTRAM ) 50 MG tablet Take 1 tablet (50 mg total) by mouth every 6 (six) hours as needed for severe pain (pain score 7-10).   Water  For Irrigation, Sterile (FREE WATER ) SOLN Place 250 mLs into feeding tube 4 (four) times  daily.   [DISCONTINUED] pantoprazole  (PROTONIX ) 40 MG tablet Take 1 tablet (40 mg total) by mouth 2 (two) times daily.   Nutritional Supplements (FEEDING SUPPLEMENT, OSMOLITE 1.5 CAL,) LIQD Place 300 mLs into feeding tube 4 (four) times daily. (Patient not taking: Reported on 09/28/2023)   pantoprazole  (PROTONIX ) 40 MG tablet Take 1 tablet (40 mg total) by mouth 2 (two) times daily.   No facility-administered encounter medications on file as of 09/28/2023.    Past Medical History:  Diagnosis Date   Anxiety    Chronic lower back pain    Depression    DJD (degenerative joint disease)    knees, neck   GERD (gastroesophageal reflux disease)    has had dilitation in the past   Hypertension     Past Surgical History:  Procedure Laterality Date   ABDOMINAL HYSTERECTOMY  03/1979   BREAST BIOPSY Left 1986 and 1987   CATARACT EXTRACTION, BILATERAL Bilateral 2014   Dr. Patrina, in [place]   ESOPHAGOGASTRODUODENOSCOPY N/A 07/01/2023   Procedure: EGD (ESOPHAGOGASTRODUODENOSCOPY);  Surgeon: Federico Rosario BROCKS, MD;  Location: [facility] ENDOSCOPY;  Service: Gastroenterology;  Laterality: N/A;  Sedation provided by ICU   EYE SURGERY  2024   [facility]   INSERTION, GASTROSTOMY TUBE, ROBOT-ASSISTED Left 07/21/2023   Procedure: INSERTION, GASTROSTOMY TUBE, ROBOT-ASSISTED;  Surgeon: Signe Mitzie LABOR, MD;  Location: [facility] OR;  Service: General;  Laterality: Left;   XI ROBOTIC ASSISTED PARAESOPHAGEAL HERNIA REPAIR N/A 07/21/2023   Procedure: REPAIR, HERNIA, PARAESOPHAGEAL, ROBOT-ASSISTED;  Surgeon: Signe Mitzie LABOR, MD;  Location: [facility] OR;  Service: General;  Laterality: N/A;  ROBOTIC PARAESOPHAGEAL HERNIA REPAIR, ROBOTIC REPAIR OF SPIGELIAN HERNIA    Family History  Problem Relation Age of Onset   Hypertension Mother    Heart disease Mother    Hypertension Father    Heart disease Father    Tremor Sister    Dementia Brother    Hypertension Daughter    Colon cancer Neg Hx    Esophageal  cancer Neg Hx    Liver disease Neg Hx     Social History   Socioeconomic History   Marital status: Widowed    Spouse name: Not on file   Number of children: 2   Years of education: Not on file   Highest education level: 12th grade  Occupational History   Occupation: retired    Comment: worked in Designer, fashion/clothing  Tobacco Use   Smoking status: Never   Smokeless tobacco: Never  Vaping Use   Vaping status: Never Used  Substance and Sexual Activity   Alcohol use: Yes    Alcohol/week: 3.0 standard drinks of alcohol    Types: 3 Standard drinks or equivalent per week    Comment: 3 nights out of the week   Drug use: No  Sexual activity: Not Currently  Other Topics Concern   Not on file  Social History Narrative   Not on file   Social Drivers of Health   Financial Resource Strain: Low Risk  (02/03/2021)   Overall Financial Resource Strain (CARDIA)    Difficulty of Paying Living Expenses: Not very hard  Food Insecurity: No Food Insecurity (08/31/2023)   Hunger Vital Sign    Worried About Running Out of Food in the Last Year: Never true    Ran Out of Food in the Last Year: Never true  Transportation Needs: No Transportation Needs (08/31/2023)   PRAPARE - Administrator, Civil Service (Medical): No    Lack of Transportation (Non-Medical): No  Physical Activity: Inactive (04/28/2019)   Exercise Vital Sign    Days of Exercise per Week: 0 days    Minutes of Exercise per Session: 0 min  Stress: No Stress Concern Present (04/28/2019)   [employer] of Occupational Health - Occupational Stress Questionnaire    Feeling of Stress : Only a little  Social Connections: Socially Isolated (08/31/2023)   Social Connection and Isolation Panel    Frequency of Communication with Friends and Family: Never    Frequency of Social Gatherings with Friends and Family: Never    Attends Religious Services: Never    Database administrator or Organizations: No    Attends Banker  Meetings: Never    Marital Status: Widowed  Intimate Partner Violence: Not At Risk (08/31/2023)   Humiliation, Afraid, Rape, and Kick questionnaire    Fear of Current or Ex-Partner: No    Emotionally Abused: No    Physically Abused: No    Sexually Abused: No    Review of Systems  All other systems reviewed and are negative.       Objective    BP 100/78   Pulse 97   Temp 98 F (36.7 C)   Ht 5' 10 (1.778 m)   Wt 210 lb 6.4 oz (95.4 kg)   SpO2 100%   BMI 30.19 kg/m   Physical Exam Vitals and nursing note reviewed.  Constitutional:      Appearance: Normal appearance. She is obese.  HENT:     Head: Normocephalic and atraumatic.  Cardiovascular:     Rate and Rhythm: Normal rate. Rhythm irregular.     Pulses: Normal pulses.     Heart sounds: Normal heart sounds.  Pulmonary:     Effort: Pulmonary effort is normal.     Breath sounds: Normal breath sounds.  Abdominal:     Comments: Gastric tube  Skin:    General: Skin is warm and dry.  Neurological:     General: No focal deficit present.     Mental Status: She is alert and oriented to person, place, and time. Mental status is at baseline.  Psychiatric:        Mood and Affect: Mood normal.        Behavior: Behavior normal.        Thought Content: Thought content normal.        Judgment: Judgment normal.     Last CBC Lab Results  Component Value Date   WBC 8.1 09/13/2023   HGB 11.5 (L) 09/13/2023   HCT 36.5 09/13/2023   MCV 97.9 09/13/2023   MCH 30.8 09/13/2023   RDW 13.5 09/13/2023   PLT 271 09/13/2023   Last metabolic panel Lab Results  Component Value Date   GLUCOSE 111 (H) 09/13/2023  NA 139 09/13/2023   K 4.6 09/13/2023   CL 101 09/13/2023   CO2 30 09/13/2023   BUN 17 09/13/2023   CREATININE 0.77 09/13/2023   EGFR 76 09/13/2023   CALCIUM 9.3 09/13/2023   PHOS 3.0 07/15/2023   PROT 5.7 (L) 09/13/2023   ALBUMIN  2.4 (L) 09/01/2023   BILITOT 0.4 09/13/2023   ALKPHOS 46 09/01/2023   AST 20  09/13/2023   ALT 12 09/13/2023   ANIONGAP 9 09/03/2023   Last lipids Lab Results  Component Value Date   CHOL 172 09/28/2023   HDL 69 09/28/2023   LDLCALC 87 09/28/2023   TRIG 71 09/28/2023   CHOLHDL 2.5 09/28/2023   Last hemoglobin A1c Lab Results  Component Value Date   HGBA1C 5.5 09/28/2023   Last thyroid  functions Lab Results  Component Value Date   TSH 0.599 07/01/2023   Last vitamin D  Lab Results  Component Value Date   VD25OH 103 (H) 09/28/2023   Last vitamin B12 and Folate Lab Results  Component Value Date   VITAMINB12 673 07/27/2023   FOLATE 8.3 07/27/2023        Assessment & Plan:   Problem List Items Addressed This Visit     GERD (gastroesophageal reflux disease)   Followed by GI. Continue protonix . Anti-reflux measures such as raising the head of the bed, avoiding tight clothing or belts, avoiding eating late at night and not lying down shortly after mealtime and achieving weight loss are discussed. Avoid ASA, NSAID's, caffeine, peppermints, alcohol and tobacco.       Relevant Medications   pantoprazole  (PROTONIX ) 40 MG tablet   Other Relevant Orders   Lipid panel (Completed)   Hemoglobin A1c (Completed)   VITAMIN D  25 Hydroxy (Vit-D Deficiency, Fractures) (Completed)   Morbid obesity (HCC) - BMI 30+ with OSA   Counseled on importance of weight management for overall health. Encouraged low calorie, heart healthy diet and moderate intensity exercise 150 minutes weekly. This is 3-5 times weekly for 30-50 minutes each session as tolerated      Relevant Orders   Lipid panel (Completed)   Hemoglobin A1c (Completed)   VITAMIN D  25 Hydroxy (Vit-D Deficiency, Fractures) (Completed)   Hyperlipidemia, mixed   Diet controlled. I recommend consuming a heart healthy diet such as Mediterranean diet or DASH diet with whole grains, fruits, vegetable, fish, lean meats, nuts, and olive oil. Limit sweets and processed foods. I also encourage moderate intensity  exercise as tolerated.      Relevant Orders   Lipid panel (Completed)   Hemoglobin A1c (Completed)   VITAMIN D  25 Hydroxy (Vit-D Deficiency, Fractures) (Completed)   Vitamin D  deficiency   Goal 60-100      Relevant Orders   Lipid panel (Completed)   Hemoglobin A1c (Completed)   VITAMIN D  25 Hydroxy (Vit-D Deficiency, Fractures) (Completed)   Iron deficiency anemia   Chronic. Continue Ferrous Sulfate  325mg  every other day. No bleeding, CBC stable      Atrial fibrillation (HCC) - Primary   Continue amiodarone  and eliquis , followed by cardiology.      S/P repair of paraesophageal hernia   Continuing tube feedings, hopeful to have g-tube removed in near future. Ongonig PT       Return in about 3 months (around 12/29/2023) for chronic follow-up with labs 1 week prior.   Jeoffrey GORMAN Barrio, FNP

## 2023-09-29 ENCOUNTER — Encounter: Payer: Self-pay | Admitting: Family Medicine

## 2023-09-29 DIAGNOSIS — Z8719 Personal history of other diseases of the digestive system: Secondary | ICD-10-CM | POA: Insufficient documentation

## 2023-09-29 LAB — HEMOGLOBIN A1C
Hgb A1c MFr Bld: 5.5 % (ref <5.7)
Mean Plasma Glucose: 111 mg/dL
eAG (mmol/L): 6.2 mmol/L

## 2023-09-29 LAB — LIPID PANEL
Cholesterol: 172 mg/dL (ref <200)
HDL: 69 mg/dL (ref >=50)
LDL Cholesterol (Calc): 87 mg/dL
Non-HDL Cholesterol (Calc): 103 mg/dL (ref <130)
Total CHOL/HDL Ratio: 2.5 (calc) (ref <5.0)
Triglycerides: 71 mg/dL (ref <150)

## 2023-09-29 LAB — VITAMIN D 25 HYDROXY (VIT D DEFICIENCY, FRACTURES): Vit D, 25-Hydroxy: 103 ng/mL — ABNORMAL HIGH (ref 30–100)

## 2023-09-29 NOTE — Assessment & Plan Note (Signed)
 Goal 60-100

## 2023-09-29 NOTE — Assessment & Plan Note (Signed)
 Continuing tube feedings, hopeful to have g-tube removed in near future. Ongonig PT

## 2023-09-29 NOTE — Assessment & Plan Note (Signed)
 Counseled on importance of weight management for overall health. Encouraged low calorie, heart healthy diet and moderate intensity exercise 150 minutes weekly. This is 3-5 times weekly for 30-50 minutes each session as tolerated

## 2023-09-29 NOTE — Assessment & Plan Note (Signed)
 Diet controlled. I recommend consuming a heart healthy diet such as Mediterranean diet or DASH diet with whole grains, fruits, vegetable, fish, lean meats, nuts, and olive oil. Limit sweets and processed foods. I also encourage moderate intensity exercise as tolerated.

## 2023-09-29 NOTE — Assessment & Plan Note (Signed)
 Followed by GI. Continue protonix . Anti-reflux measures such as raising the head of the bed, avoiding tight clothing or belts, avoiding eating late at night and not lying down shortly after mealtime and achieving weight loss are discussed. Avoid ASA, NSAID's, caffeine, peppermints, alcohol and tobacco.

## 2023-09-29 NOTE — Assessment & Plan Note (Signed)
 Chronic. Continue Ferrous Sulfate  325mg  every other day. No bleeding, CBC stable

## 2023-09-29 NOTE — Assessment & Plan Note (Signed)
 Continue amiodarone  and eliquis , followed by cardiology.

## 2023-09-30 ENCOUNTER — Other Ambulatory Visit: Payer: Self-pay

## 2023-09-30 DIAGNOSIS — K2100 Gastro-esophageal reflux disease with esophagitis, without bleeding: Secondary | ICD-10-CM

## 2023-10-05 ENCOUNTER — Encounter: Payer: Self-pay | Admitting: Nurse Practitioner

## 2023-10-05 ENCOUNTER — Ambulatory Visit: Attending: Nurse Practitioner | Admitting: Nurse Practitioner

## 2023-10-05 VITALS — BP 112/82 | HR 68 | Ht 70.0 in | Wt 212.2 lb

## 2023-10-05 DIAGNOSIS — I4891 Unspecified atrial fibrillation: Secondary | ICD-10-CM

## 2023-10-05 DIAGNOSIS — G4733 Obstructive sleep apnea (adult) (pediatric): Secondary | ICD-10-CM

## 2023-10-05 DIAGNOSIS — I119 Hypertensive heart disease without heart failure: Secondary | ICD-10-CM

## 2023-10-05 DIAGNOSIS — I272 Pulmonary hypertension, unspecified: Secondary | ICD-10-CM | POA: Diagnosis not present

## 2023-10-05 DIAGNOSIS — J810 Acute pulmonary edema: Secondary | ICD-10-CM

## 2023-10-05 MED ORDER — METOPROLOL SUCCINATE ER 50 MG PO TB24: 50.0000 mg | ORAL_TABLET | Freq: Every day | ORAL | 2 refills | Status: DC

## 2023-10-05 MED ORDER — APIXABAN 5 MG PO TABS: 5.0000 mg | ORAL_TABLET | Freq: Two times a day (BID) | ORAL | 2 refills | Status: AC

## 2023-10-05 MED ORDER — AMIODARONE HCL 200 MG PO TABS: 200.0000 mg | ORAL_TABLET | Freq: Every day | ORAL | 2 refills | Status: DC

## 2023-10-05 NOTE — Progress Notes (Addendum)
 Cardiology Office Note   Date: 10/05/2023 ID:  Akela, Pocius Jul 17, 1940, MRN [id number] PCP: Kayla Jeoffrey RAMAN, FNP  [facility] Providers Cardiologist:  Alvan Carrier, MD     History of Present Illness Darlene [name] is a 83 y.o. female with a PMH of A-fib, pulmonary HTN, HLD, OSA on CPAP, who presents today for hospital follow-up.   Last seen by Olivia Pavy, PA-C on December 28, 2022. Pt noted palpitaitons while going to the bathroom at night, diaphoresis as well. Resolution of symptoms in a few minutes. Felt like CPAP mask wasn't on right. Monitor was arranged - see full report below.   History of paraesophageal hernia s/p repair and G-tube placement after this office visit.  Most recent hospital stay in June 2025 and presented with worsening shortness of breath, weakness, cough and nausea, and admitted for sepsis in setting of CAP, nausea/vomiting in the setting of paraesophageal hernia.  CXR revealed left lung base parenchymal opacity suggesting acute infiltrate/pneumonia and right basilar more bandlike opacity.  CT of chest showed improved aeration of both lung bases, interval paraesophageal hernia repair with improved wall thickening of distal esophagus, cholelithiasis without cholecystitis and possible angiomyolipoma of right kidney.  Completed antibiotics.  General surgery was consulted and recommended G-tube with full liquid diet for comfort.  Today she presents for outpatient follow-up with her daughter.  She states she is recovering well. She has been weaned off her feedings with her G-tube. Following GI MD closely. Doing well from a cardiac perspective. Denies any chest pain, shortness of breath, palpitations, syncope, presyncope, dizziness, orthopnea, PND, swelling or significant weight changes, acute bleeding, or claudication.  ROS: Negative. See HPI.   Studies Reviewed  EKG: EKG is not ordered today.   Echo 06/2023:  1. Left ventricular ejection fraction, by  estimation, is 50 to 55%. The  left ventricle has low normal function. The left ventricle demonstrates  global hypokinesis. There is mild concentric left ventricular hypertrophy.  Left ventricular diastolic  function could not be evaluated.   2. Akiniesis of the RV free wall with tethering of the apex consistent  with McConnell's sign and acute RV dsyfunction. Right ventricular systolic  function is mildly reduced. The right ventricular size is mildly enlarged.  There is moderately elevated  pulmonary artery systolic pressure.   3. Left atrial size was moderately dilated.   4. A small pericardial effusion is present.   5. The mitral valve is normal in structure. Trivial mitral valve  regurgitation.   6. Tricuspid valve regurgitation is moderate.   7. The aortic valve is tricuspid. Aortic valve regurgitation is trivial.  Aortic valve sclerosis is present, with no evidence of aortic valve  stenosis.   8. The inferior vena cava is dilated in size with <50% respiratory  variability, suggesting right atrial pressure of 15 mmHg.  Cardiac monitor 01/2023:    14 day monitor   Patient in afib throughout study (100% burden). Rates 38 to 106, average 61 bpm.   No symptoms reported   Occasional ventricular ectopy in the form of isolated PVCs (1.2%), rare couplets     Patch Wear Time:  13 days and 23 hours (2024-10-14T14:43:34-0400 to 2024-10-28T14:43:26-0400)   Atrial Fibrillation occurred continuously (100% burden), ranging from 38-106 bpm (avg of 61 bpm). Isolated VEs were occasional (1.2%, 12436), VE Couplets were rare (<1.0%, 63), and no VE Triplets were present. Ventricular Bigeminy and Trigeminy were  present.    Lexiscan  11/2019:  There was no ST  segment deviation noted during stress. The study is normal. There are no perfusion defect consistentw with prior infarct or current ischemia. This is a low risk study. The left ventricular ejection fraction is normal (55-65%).   Physical  Exam VS:  BP 112/82 (BP Location: Left Arm)   Pulse 68   Ht 5' 10 (1.778 m)   Wt 212 lb 3.2 oz (96.3 kg)   SpO2 94%   BMI 30.45 kg/m        Wt Readings from Last 3 Encounters:  10/05/23 212 lb 3.2 oz (96.3 kg)  09/28/23 210 lb 6.4 oz (95.4 kg)  09/13/23 209 lb (94.8 kg)    GEN: Well nourished, well developed in no acute distress NECK: No JVD; No carotid bruits CARDIAC: S1/S2, RRR, no murmurs, rubs, gallops RESPIRATORY:  Clear to auscultation without rales, wheezing or rhonchi  ABDOMEN: Soft, non-tender, non-distended EXTREMITIES:  No edema; No deformity   ASSESSMENT AND PLAN  A-fib Denies any tachycardia or palpitations.  Heart rate is well-controlled today.  Continue amiodarone  and metoprolol  succinate.  Continue Eliquis  for stroke prevention.  She is on appropriate dosage denies any bleeding issues.  Most recent LFTs were benign and TSH 0.599 (within normal limits) 3 months ago. Heart healthy diet and regular cardiovascular exercise encouraged.    OSA on CPAP, pulmonary HTN  Encouraged continued compliance.  Most recent echocardiogram from April 2025 - showed findings of McConnell sign with mildly reduced right ventricular systolic function with moderately elevated PASP.  She has had history of pulmonary hypertension in the past due to OSA.  CTA of chest was negative for PE.  Denies any acute concerning signs or symptoms.  Will continue to monitor. Continue to follow with PCP.  I spent a total duration of 35 minutes reviewing prior notes, reviewing outside records including  labs, face-to-face counseling of  medical condition, pathophysiology, evaluation, management, and documenting the findings in the note.    Dispo: Will provide refills per patient's request.  Follow-up with MD/APP in 6 months or sooner if any changes.  Signed, Almarie Crate, NP

## 2023-10-05 NOTE — Patient Instructions (Signed)
 Medication Instructions:  Your physician recommends that you continue on your current medications as directed. Please refer to the Current Medication list given to you today.   Labwork: None  Testing/Procedures: None  Follow-Up: Your physician recommends that you schedule a follow-up appointment in: 6 months  Any Other Special Instructions Will Be Listed Below (If Applicable).  Thank you for choosing [facility]!      If you need a refill on your cardiac medications before your next appointment, please call your pharmacy.

## 2023-10-21 ENCOUNTER — Other Ambulatory Visit: Payer: Self-pay | Admitting: Family Medicine

## 2023-10-21 NOTE — Telephone Encounter (Signed)
 Copied from CRM #[id number]. Topic: Clinical - Medication Refill >> Oct 21, 2023  1:42 PM Berwyn MATSU wrote: Medication: metoCLOPramide  (REGLAN ) 10 MG tablet  Has the patient contacted their pharmacy? Yes (Agent: If no, request that the patient contact the pharmacy for the refill. If patient does not wish to contact the pharmacy document the reason why and proceed with request.) (Agent: If yes, when and what did the pharmacy advise?)  This is the patient's preferred pharmacy:  [facility] - [place], [place] - [address] [facility] [address] [place] [address] [place] [address] Phone: [phone] Fax: [phone] Hours: Not open 24 hours  Is this the correct pharmacy for this prescription? Yes If no, delete pharmacy and type the correct one.   Has the prescription been filled recently? Yes  Is the patient out of the medication? Yes  Has the patient been seen for an appointment in the last year OR does the patient have an upcoming appointment? Yes  Can we respond through MyChart? Yes  Agent: Please be advised that Rx refills may take up to 3 business days. We ask that you follow-up with your pharmacy.

## 2023-10-25 NOTE — Telephone Encounter (Signed)
 Requested medication (s) are due for refill today - 07/29/23 #60  Requested medication (s) are on the active medication list -yes  Future visit scheduled -yes  Last refill: 07/29/23 #60  Notes to clinic: non delegated Rx  Requested Prescriptions  Pending Prescriptions Disp Refills   metoCLOPramide  (REGLAN ) 10 MG tablet 60 tablet 0    Sig: Take 1 tablet (10 mg total) by mouth 4 (four) times daily -  before meals and at bedtime.     Not Delegated - Gastroenterology: Antiemetics - metoclopramide  Failed - 10/25/2023 12:43 PM      Failed - This refill cannot be delegated      Passed - Cr in normal range and within 360 days    Creat  Date Value Ref Range Status  09/13/2023 0.77 0.60 - 0.95 mg/dL Final   Creatinine, Urine  Date Value Ref Range Status  02/16/2023 139 20 - 275 mg/dL Final         Passed - Valid encounter within last 6 months    Recent Outpatient Visits           3 weeks ago Atrial fibrillation, unspecified type [facility])   [place] [facility] Kayla Jeoffrey RAMAN, FNP   1 month ago Sepsis due to pneumonia [facility])   [place] [facility] Aletha Bene, MD   2 months ago Acute respiratory failure with hypoxia [facility])   [place] [facility] Medicine Aletha Bene, MD   4 months ago Encounter to establish care with new provider   [place] [facility] Medicine Kayla Jeoffrey RAMAN, FNP       Future Appointments             In 5 months Miriam Norris, NP [facility] at [place], [place]               Requested Prescriptions  Pending Prescriptions Disp Refills   metoCLOPramide  (REGLAN ) 10 MG tablet 60 tablet 0    Sig: Take 1 tablet (10 mg total) by mouth 4 (four) times daily -  before meals and at bedtime.     Not Delegated - Gastroenterology: Antiemetics - metoclopramide  Failed - 10/25/2023 12:43 PM      Failed - This refill cannot be delegated      Passed - Cr in normal range and within 360  days    Creat  Date Value Ref Range Status  09/13/2023 0.77 0.60 - 0.95 mg/dL Final   Creatinine, Urine  Date Value Ref Range Status  02/16/2023 139 20 - 275 mg/dL Final         Passed - Valid encounter within last 6 months    Recent Outpatient Visits           3 weeks ago Atrial fibrillation, unspecified type [facility])   [place] [facility] Medicine Kayla Jeoffrey RAMAN, FNP   1 month ago Sepsis due to pneumonia [facility])   [place] [facility] Aletha Bene, MD   2 months ago Acute respiratory failure with hypoxia [facility])   [place] [facility] Medicine Aletha Bene, MD   4 months ago Encounter to establish care with new provider   [place] [facility] Medicine Kayla Jeoffrey RAMAN, FNP       Future Appointments             In 5 months Miriam Norris, NP [facility] at [place], [place]

## 2023-10-25 NOTE — Telephone Encounter (Signed)
 Pharmacy sent refill request for metoCLOPramide  (REGLAN ) 10 MG tablet  **90 day script requested**  Pharmacy:  [facility] [address], [place] - [address], [place] [address] Phone: [phone]  Fax: [phone]   Please advise pharmacist.

## 2023-10-25 NOTE — Telephone Encounter (Signed)
 Requested medication (s) are due for refill today - 07/29/23 #60   Requested medication (s) are on the active medication list -yes   Future visit scheduled -yes   Last refill: 07/29/23 #60- outside provider   Notes to clinic: non delegated Rx, additional note requests 90 day supply- see Rx- patient may not be taking as prescribed.   Requested Prescriptions  Pending Prescriptions Disp Refills   metoCLOPramide  (REGLAN ) 10 MG tablet 60 tablet 0    Sig: Take 1 tablet (10 mg total) by mouth 4 (four) times daily -  before meals and at bedtime.     Not Delegated - Gastroenterology: Antiemetics - metoclopramide  Failed - 10/25/2023  3:20 PM      Failed - This refill cannot be delegated      Passed - Cr in normal range and within 360 days    Creat  Date Value Ref Range Status  09/13/2023 0.77 0.60 - 0.95 mg/dL Final   Creatinine, Urine  Date Value Ref Range Status  02/16/2023 139 20 - 275 mg/dL Final         Passed - Valid encounter within last 6 months    Recent Outpatient Visits           3 weeks ago Atrial fibrillation, unspecified type [facility])   [place] [facility] Medicine Kayla Jeoffrey RAMAN, FNP   1 month ago Sepsis due to pneumonia [facility])   [place] [facility] Aletha Bene, MD   2 months ago Acute respiratory failure with hypoxia [facility])   [place] [facility] Aletha Bene, MD   4 months ago Encounter to establish care with new provider   [place] [facility] Kayla Jeoffrey RAMAN, FNP       Future Appointments             In 5 months Miriam Norris, NP [facility] at [place], [place]               Requested Prescriptions  Pending Prescriptions Disp Refills   metoCLOPramide  (REGLAN ) 10 MG tablet 60 tablet 0    Sig: Take 1 tablet (10 mg total) by mouth 4 (four) times daily -  before meals and at bedtime.     Not Delegated - Gastroenterology: Antiemetics - metoclopramide  Failed -  10/25/2023  3:20 PM      Failed - This refill cannot be delegated      Passed - Cr in normal range and within 360 days    Creat  Date Value Ref Range Status  09/13/2023 0.77 0.60 - 0.95 mg/dL Final   Creatinine, Urine  Date Value Ref Range Status  02/16/2023 139 20 - 275 mg/dL Final         Passed - Valid encounter within last 6 months    Recent Outpatient Visits           3 weeks ago Atrial fibrillation, unspecified type [facility])   [place] [facility] Kayla Jeoffrey RAMAN, FNP   1 month ago Sepsis due to pneumonia [facility])   [place] [facility] Aletha Bene, MD   2 months ago Acute respiratory failure with hypoxia [facility])    [facility] Aletha Bene, MD   4 months ago Encounter to establish care with new provider    [facility] Medicine Kayla Jeoffrey RAMAN, FNP       Future Appointments  In 5 months Miriam Norris, NP [facility] at [place], [place]

## 2023-10-26 ENCOUNTER — Telehealth: Payer: Self-pay | Admitting: Pediatrics

## 2023-10-26 ENCOUNTER — Telehealth: Payer: Self-pay

## 2023-10-26 ENCOUNTER — Other Ambulatory Visit: Payer: Self-pay

## 2023-10-26 DIAGNOSIS — K2100 Gastro-esophageal reflux disease with esophagitis, without bleeding: Secondary | ICD-10-CM

## 2023-10-26 MED ORDER — METOCLOPRAMIDE HCL 10 MG PO TABS
10.0000 mg | ORAL_TABLET | Freq: Three times a day (TID) | ORAL | 3 refills | Status: DC
Start: 2023-10-26 — End: 2023-12-07

## 2023-10-26 NOTE — Telephone Encounter (Signed)
 Prescription Request  10/26/2023  LOV: 09/28/23  What is the name of the medication or equipment? metoCLOPramide  (REGLAN ) 10 MG tablet [[id number]]   Have you contacted your pharmacy to request a refill? Yes   Which pharmacy would you like this sent to?  [facility] [place], [place] - [address] [facility] [address] [place] [address] [place] [address] Phone: [phone] Fax: [phone]    Patient notified that their request is being sent to the clinical staff for review and that they should receive a response within 2 business days.   Please advise at [facility] [phone]

## 2023-10-26 NOTE — Telephone Encounter (Signed)
 Inbound call from patients daughter stating her mother is out of a medication that helps with her Thalia daughter wants to know if we can put in a prescription of medication Reglan .  Requesting a call back   Please advise  Thank you

## 2023-10-26 NOTE — Telephone Encounter (Signed)
 Reglan  was sent in by the patient's PCP today.

## 2023-10-27 IMAGING — MG MM DIGITAL SCREENING BILAT W/ TOMO AND CAD
8 series · 8 of 24 positions shown · non-contrast
Comparison: Previous exam(s).

CLINICAL DATA: Screening.

EXAM:
DIGITAL SCREENING BILATERAL MAMMOGRAM WITH TOMOSYNTHESIS AND CAD
TECHNIQUE: Bilateral screening digital craniocaudal and mediolateral oblique
mammograms were obtained. Bilateral screening digital breast
tomosynthesis was performed. The images were evaluated with
computer-aided detection.

[L CC synth-2D]
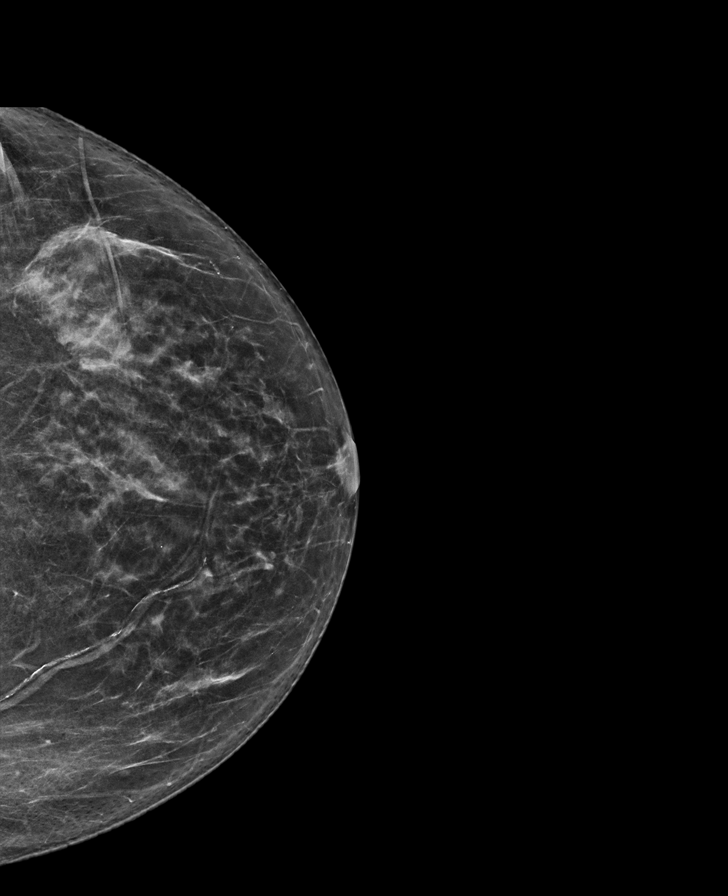

[L MLO synth-2D]
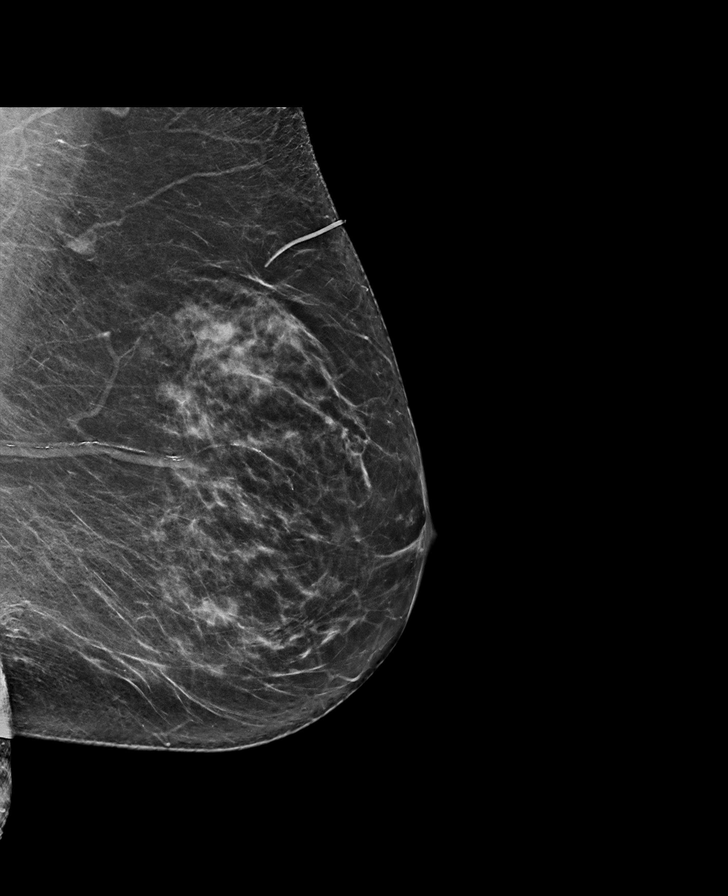

[R MLO synth-2D]
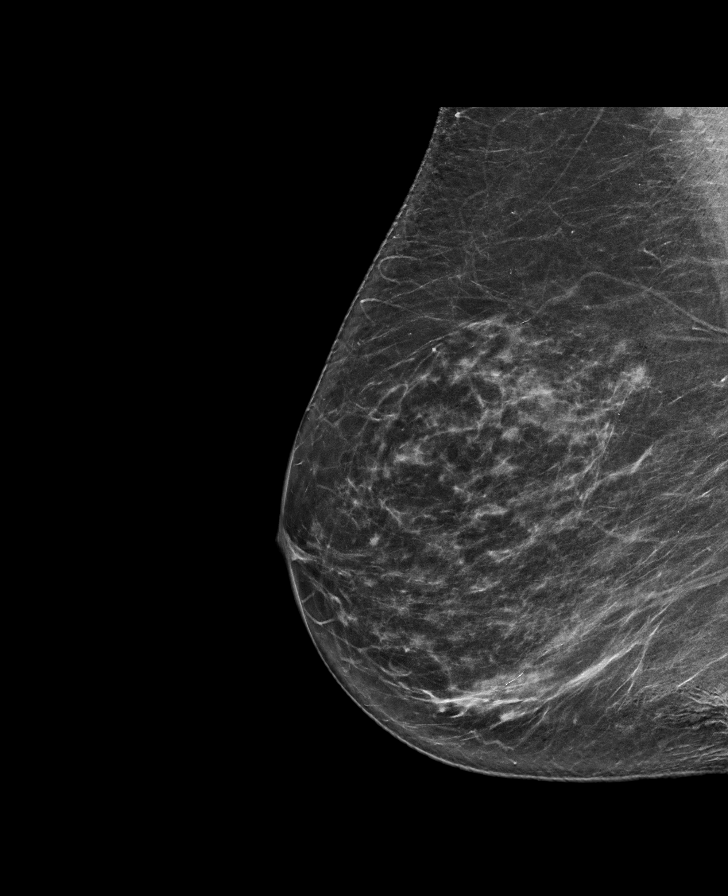

[R CC synth-2D]
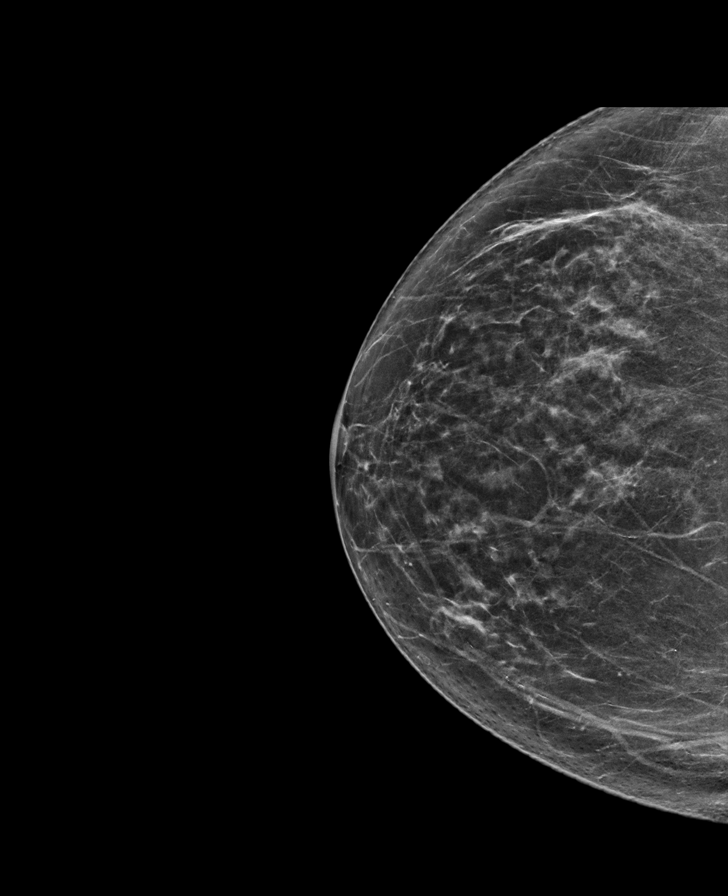

[L CC tomo · tomo slice 34/67.0]
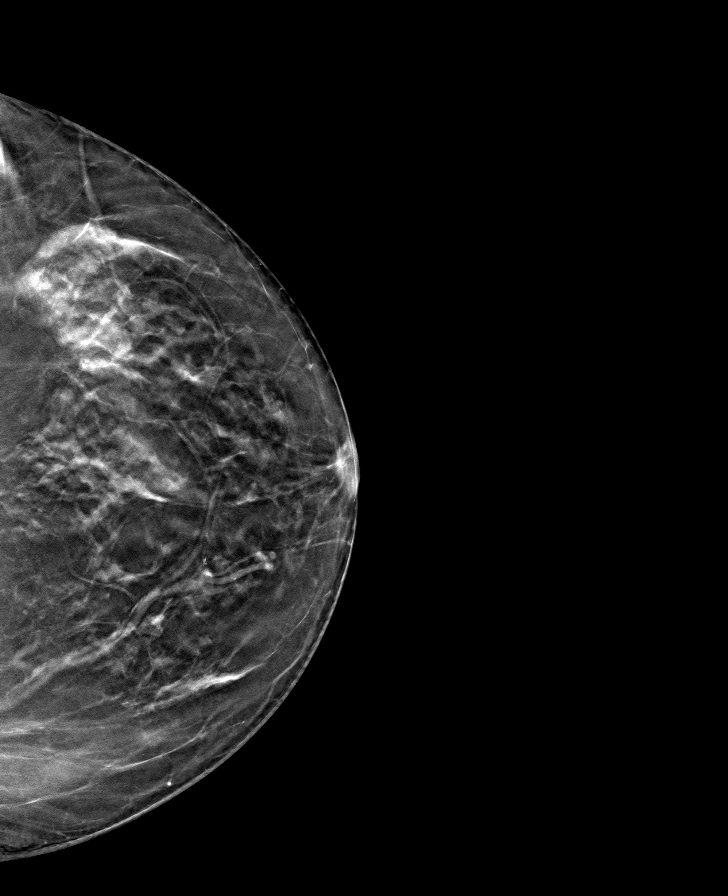

[R MLO tomo · tomo slice 39/76.0]
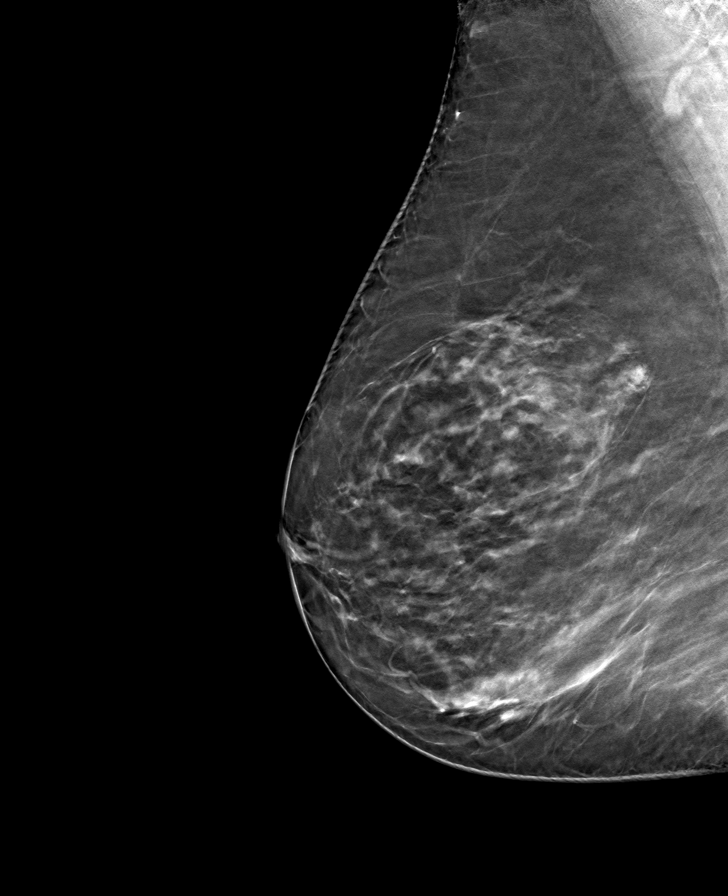

[R CC tomo · tomo slice 37/73.0]
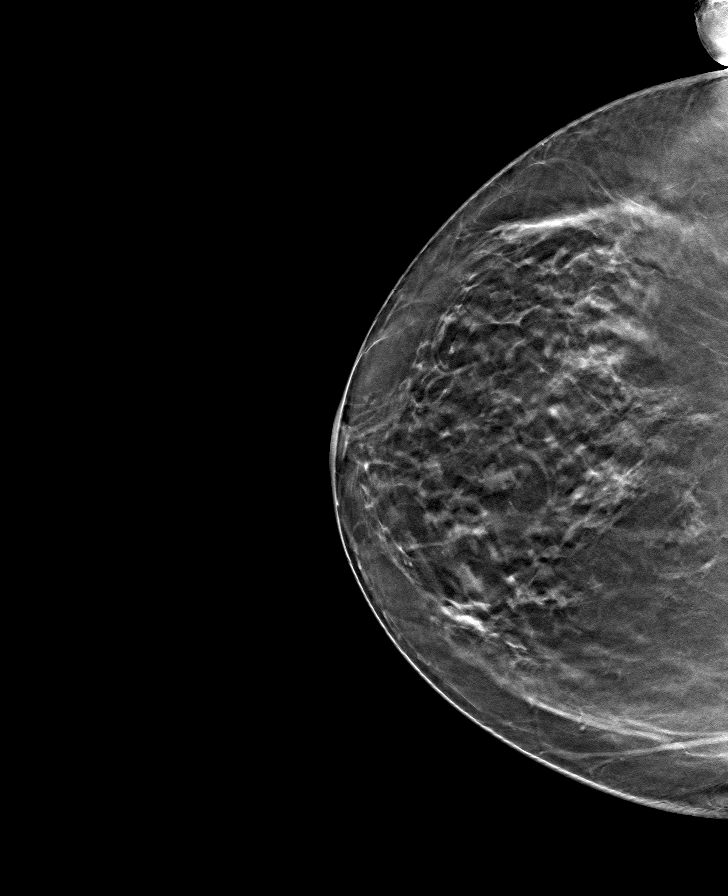

[L MLO tomo · tomo slice 39/76.0]
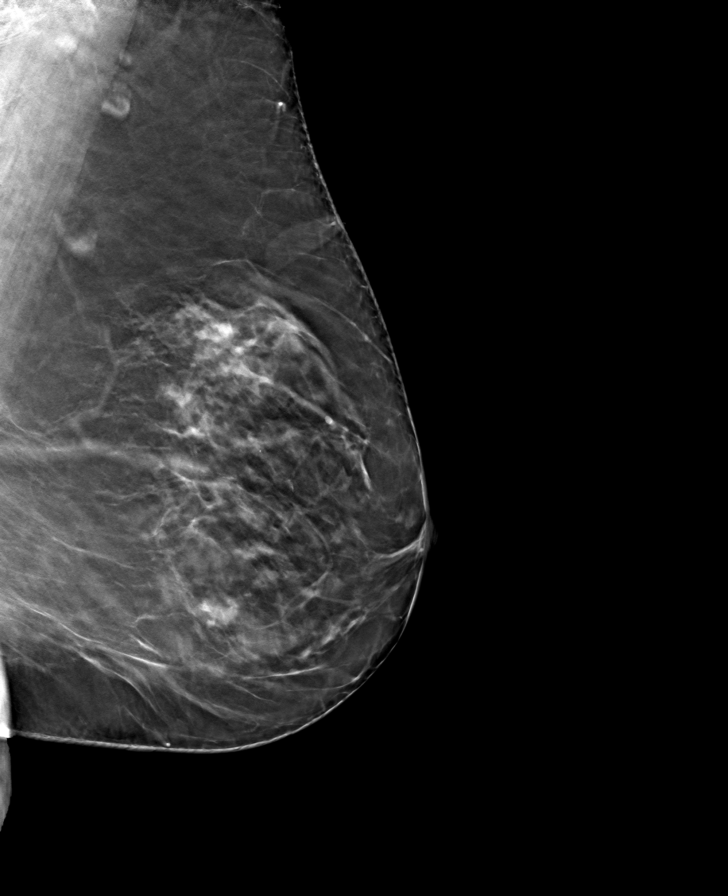

[8 of 24 positions shown; findings below may reference images not displayed]

ACR Breast Density Category b: There are scattered areas of
fibroglandular density.
FINDINGS: There are no findings suspicious for malignancy.
IMPRESSION: No mammographic evidence of malignancy. A result letter of this
screening mammogram will be mailed directly to the patient.

RECOMMENDATION:
Screening mammogram in one year. (Code:51-O-LD2)

BI-RADS CATEGORY  1: Negative.

## 2023-10-27 MED ORDER — METOCLOPRAMIDE HCL 10 MG PO TABS: 10.0000 mg | ORAL_TABLET | Freq: Four times a day (QID) | ORAL | 0 refills | Status: DC

## 2023-10-27 NOTE — Telephone Encounter (Signed)
 Patients daughter called stating she never heard anything back about getting a refill for patients Reglan . I advised her that it looked like patients PCP had sent In the refill for her. She states that she spoke with them and they advised her that they could not fill the prescription and that's why she called us .I adivsed her that they did refill it and it was sent to [facility]. Daughter states patient has been out of the medication for 3 days and is requesting a call to discuss if there is anyway she can get 12 pills to last her until the other prescription is delivered. Daughter is requesting refill be sent to [facility] on [address] in [place]. Please advise.

## 2023-10-27 NOTE — Telephone Encounter (Signed)
 I spoke to [place] and I confirmed the pharmacy that she wanted the 12 tablets sent to.  I also confirmed that her Rx records show that a month supply was sent through [facility] for a 1 month supply with 3 refills.

## 2023-10-27 NOTE — Telephone Encounter (Signed)
 Dr. Federico you sent in Reglan  for this patient back in April but it then looks like it was discontinued.  Her PCP filled the Rx yesterday but she still wants us  to send in another Rx to her local pharmacy.  Do you want this patient on Reglan ?  Do you want me to send in the medication to her local pharmacy?

## 2023-11-02 ENCOUNTER — Other Ambulatory Visit: Payer: Self-pay | Admitting: Nurse Practitioner

## 2023-11-03 ENCOUNTER — Other Ambulatory Visit: Payer: Self-pay | Admitting: Nurse Practitioner

## 2023-11-03 MED ORDER — METOPROLOL SUCCINATE ER 50 MG PO TB24: 50.0000 mg | ORAL_TABLET | Freq: Every day | ORAL | 3 refills | Status: AC

## 2023-11-03 NOTE — Telephone Encounter (Signed)
 See scanned doc in [facility] from [facility]-needs clarification

## 2023-11-03 NOTE — Telephone Encounter (Signed)
 [facility] clarification request form faxed back

## 2023-11-16 ENCOUNTER — Telehealth: Payer: Self-pay

## 2023-11-16 ENCOUNTER — Ambulatory Visit: Payer: Self-pay | Admitting: Surgery

## 2023-11-16 NOTE — Telephone Encounter (Signed)
   Pre-operative Risk Assessment    Patient Name: Darlene [name]  DOB: 02/05/1941 MRN: [id number]   Date of last office visit: 10/05/23 Date of next office visit: 04/11/23   Request for Surgical Clearance    Procedure:  Hernia Surgery  Date of Surgery:  Clearance TBD                                Surgeon:  Debby Shipper, MD Surgeon's Group or Practice Name:  [facility] Phone number:  [phone] Fax number:  [phone]   Type of Clearance Requested:   - Medical  - Pharmacy:  Hold Apixaban  (Eliquis )     Type of Anesthesia:  General    Additional requests/questions:    Bonney Ival LOISE Gerome   11/16/2023, 4:44 PM

## 2023-11-17 NOTE — Telephone Encounter (Signed)
 Darlene [name]'s perioperative risk of a major cardiac event is 0.4% according to the Revised Cardiac Risk Index (RCRI).  Therefore, she is at low risk for perioperative complications.   Her functional capacity is poor at 3.3 METs according to the Duke Activity Status Index (DASI). Recommendations: The patient requires an echocardiogram before a disposition can be made regarding surgical risk.                Echo was performed earlier this year, last NST in 2021 was low risk. Will consult attending cardiologist regarding recs.  Antiplatelet and/or Anticoagulation Recommendations: Will consult holding time for Eliquis  once I have heard back from attending cardiologist.    Called and s/w patient. She admits to shortness of breath occasionally, has been going on for years/unchanged per her report, says I have A-fib, I always am short-winded because of my A-fib. Denies any chest pain. At last office visit, she denied any shortness of breath. Reviewed Echo with her from earlier this year. Will consult attending cardiologist regarding recs.    Darlene Crate, NP

## 2023-11-17 NOTE — Progress Notes (Signed)
  Ms. Radel's perioperative risk of a major cardiac event is 0.4% according to the Revised Cardiac Risk Index (RCRI).  Therefore, she is at low risk for perioperative complications.   Her functional capacity is poor at 3.3 METs according to the Duke Activity Status Index (DASI). Recommendations: The patient requires an echocardiogram before a disposition can be made regarding surgical risk.                Echo was performed earlier this year, last NST in 2021 was low risk. Will consult attending cardiologist regarding recs.  Antiplatelet and/or Anticoagulation Recommendations: Will consult holding time for Eliquis  once I have heard back from attending cardiologist.   Called and s/w patient. She admits to shortness of breath occasionally, has been going on for years/unchanged per her report, says I have A-fib, I always am short-winded because of my A-fib. Denies any chest pain. At last office visit, she denied any shortness of breath. Reviewed Echo with her from earlier this year. Will consult attending cardiologist regarding recs.   Almarie Crate, NP

## 2023-11-18 NOTE — Telephone Encounter (Signed)
 Please advise holding Eliquis  prior to hernia repair not yet scheduled.   Thank you!  DW

## 2023-11-18 NOTE — Telephone Encounter (Signed)
 Patient with diagnosis of atrial fibrillation on Eliquis  for anticoagulation.    Procedure:  Hernia Surgery   Date of Surgery:  Clearance TBD      CHA2DS2-VASc Score = 5   This indicates a 7.2% annual risk of stroke. The patient's score is based upon: CHF History: 1 HTN History: 1 Diabetes History: 0 Stroke History: 0 Vascular Disease History: 0 Age Score: 2 Gender Score: 1    CrCl 84 Platelet count 271  Patient has not had an Afib/aflutter ablation or Watchman within the last 3 months or DCCV within the last 30 days   Per office protocol, patient can hold Eliquis  for 2 days prior to procedure.   Patient will not need bridging with Lovenox (enoxaparin) around procedure.  **This guidance is not considered finalized until pre-operative APP has relayed final recommendations.**

## 2023-11-24 NOTE — Telephone Encounter (Signed)
 Darlene [name],   Did you find out from cardiologist about moving forward with surgical clearance?

## 2023-11-25 ENCOUNTER — Telehealth: Payer: Self-pay | Admitting: Family Medicine

## 2023-11-25 ENCOUNTER — Other Ambulatory Visit: Payer: Self-pay

## 2023-11-25 DIAGNOSIS — I272 Pulmonary hypertension, unspecified: Secondary | ICD-10-CM

## 2023-11-25 DIAGNOSIS — J810 Acute pulmonary edema: Secondary | ICD-10-CM

## 2023-11-25 DIAGNOSIS — I119 Hypertensive heart disease without heart failure: Secondary | ICD-10-CM

## 2023-11-25 DIAGNOSIS — F419 Anxiety disorder, unspecified: Secondary | ICD-10-CM

## 2023-11-25 MED ORDER — ESCITALOPRAM OXALATE 20 MG PO TABS: ORAL_TABLET | ORAL | 1 refills | Status: DC

## 2023-11-25 MED ORDER — FUROSEMIDE 20 MG PO TABS: 20.0000 mg | ORAL_TABLET | Freq: Every day | ORAL | 1 refills | Status: DC

## 2023-11-25 NOTE — Telephone Encounter (Signed)
 Prescription Request  11/25/2023  LOV: 09/28/2023  What is the name of the medication or equipment?   escitalopram  (LEXAPRO ) 20 MG tablet   furosemide  (LASIX ) 20 MG tablet  **new 90 day scripts requested**  Have you contacted your pharmacy to request a refill? Yes   Which pharmacy would you like this sent to?  [facility] [place], [place] - [address] [facility] [address] [place] [address] [place] [address] Phone: [phone] Fax: [phone]    Patient notified that their request is being sent to the clinical staff for review and that they should receive a response within 2 business days.   Please advise pharmacist.

## 2023-11-26 NOTE — Telephone Encounter (Signed)
   Patient Name: Darlene [name]  DOB: [birth year] MRN: [id number]  Primary Cardiologist: Alvan Carrier, MD  Chart reviewed as part of pre-operative protocol coverage. Given past medical history and time since last visit, based on ACC/AHA guidelines, Darlene [name] is at acceptable risk for the planned procedure without further cardiovascular testing.   Per office protocol, patient can hold Eliquis  for 2 days prior to procedure.   Patient will not need bridging with Lovenox (enoxaparin) around procedure.  The patient was advised that if she develops new symptoms prior to surgery to contact our office to arrange for a follow-up visit, and she verbalized understanding.  I will route this recommendation to the requesting party via Epic fax function and remove from pre-op pool.  Please call with questions.  Lamarr Satterfield, NP 11/26/2023, 8:56 AM

## 2023-11-29 ENCOUNTER — Other Ambulatory Visit: Payer: Self-pay | Admitting: Family Medicine

## 2023-11-29 DIAGNOSIS — F419 Anxiety disorder, unspecified: Secondary | ICD-10-CM

## 2023-12-02 ENCOUNTER — Other Ambulatory Visit: Payer: Self-pay

## 2023-12-02 ENCOUNTER — Encounter: Payer: Self-pay | Admitting: Family Medicine

## 2023-12-02 ENCOUNTER — Ambulatory Visit (INDEPENDENT_AMBULATORY_CARE_PROVIDER_SITE_OTHER): Admitting: Family Medicine

## 2023-12-02 ENCOUNTER — Ambulatory Visit: Payer: Self-pay

## 2023-12-02 VITALS — BP 116/64 | HR 63 | Temp 98.2°F | Ht 70.0 in | Wt 212.0 lb

## 2023-12-02 DIAGNOSIS — R197 Diarrhea, unspecified: Secondary | ICD-10-CM

## 2023-12-02 MED ORDER — LINACLOTIDE 72 MCG PO CAPS: 72.0000 ug | ORAL_CAPSULE | Freq: Every day | ORAL | 3 refills | Status: DC

## 2023-12-02 MED ORDER — ESCITALOPRAM OXALATE 5 MG PO TABS: 5.0000 mg | ORAL_TABLET | Freq: Every day | ORAL | 3 refills | Status: DC

## 2023-12-02 NOTE — Telephone Encounter (Signed)
 FYI Only or Action Required?: Action required by provider: clinical question for provider.  Patient was last seen in primary care on 09/28/2023 by Kayla Jeoffrey RAMAN, FNP.  Called Nurse Triage reporting Diarrhea.  Symptoms began several months ago.  Interventions attempted: Nothing.  Symptoms are: gradually worsening.  Triage Disposition: See Physician Within 24 Hours  Patient/caregiver understands and will follow disposition?: Yes, will follow disposition  Message from [facility] sent at 12/02/2023  9:53 AM EDT  Summary: uncontrollable diarrhea   Patient has been having really bad diarrhea intermittently between July and now. Its not every day but when it does hit its bad. The diarrhea is uncontrollable.  Daughter concerned about possible drug interaction as well, daughter informed by pharmacy of possible drug interaction with the escitalopram  (LEXAPRO ) 20 MG tablet and amiodarone  (PACERONE ) 200 MG tablet. Daughter inquiring what the drug interaction would be and if that patient should be taking those drugs together.  Requesting callback: [phone]         Reason for Disposition  Abdominal pain  (Exceptions: Pain clears with each passage of diarrhea stool, or symptoms similar to previously diagnosed irritable bowel syndrome.)  Answer Assessment - Initial Assessment Questions 1. DIARRHEA SEVERITY: How bad is the diarrhea? How many more stools have you had in the past 24 hours than normal?      Intermittent, occurs 1-2/week 2. ONSET: When did the diarrhea begin?      Since July 3. STOOL DESCRIPTION:  How loose or watery is the diarrhea? What is the stool color? Is there any blood or mucous in the stool?     Pt would describe it as watery 4. VOMITING: Are you also vomiting? If Yes, ask: How many times in the past 24 hours?      denies 5. ABDOMEN PAIN: Are you having any abdomen pain? If Yes, ask: What does it feel like? (e.g., crampy, dull, intermittent, constant)       Pt daughter states that she has a known hernia and the feeding tube hole has not healed, no pain other than this 6. ABDOMEN PAIN SEVERITY: If present, ask: How bad is the pain?  (e.g., Scale 1-10; mild, moderate, or severe)     Pt states that she has rumbles and grumbles in her stomach 7. ORAL INTAKE: If vomiting, Have you been able to drink liquids? How much liquids have you had in the past 24 hours?     Daughter feels she is keeping up on her fluids 9. EXPOSURE: Have you traveled to a foreign country recently? Have you been exposed to anyone with diarrhea? Could you have eaten any food that was spoiled?     denies 10. ANTIBIOTIC USE: Are you taking antibiotics now or have you taken antibiotics in the past 2 months?       Pt daughter states that she has not been on abx 11. OTHER SYMPTOMS: Do you have any other symptoms? (e.g., fever, blood in stool)       Denies blood in stool, denies fever  Pt daughter states that stool is very odorous. Pt has been tested for Cdiff-negative.  Daughter concerned about Lexapro  and amiodarone  interaction, pt has not had her lexapro  for 2 days as she is out. Pharm will not rx until provider clears the interactions with pharmacy.  Protocols used: [facility]

## 2023-12-02 NOTE — Progress Notes (Signed)
 Subjective:    Patient ID: Darlene [name] Alert, female    DOB: 1940-10-13, 83 y.o.   MRN: [id number]  Diarrhea     Patient is an 83 year old Caucasian female who presents today complaining of intermittent diarrhea off and on for the last 2 months.  She states that she will suddenly have the bathroom.  She reports she will feel increasing bowel sounds.  She states then she feels like her intestines explode.  She will often have watery diarrhea that splatter's onto the walls, onto her body, often missing the toilet.  This happens twice a week.  In between, she often goes days without having a bowel movement.  She denies any nausea or vomiting.  She denies any fever or chills.  She denies any melena or hematochezia.  She was checked for C. difficile and found to be negative.  She is here today for treatment options.  She states that she has not had a bowel movement this week.  She is also on amiodarone  for atrial fibrillation which prolongs the QT interval.  Her doctor discontinued her Lexapro  a few days ago because of a warm interaction with Lexapro  and amiodarone .  Patient has been on Lexapro  for a long period of time 20 mg a day.  She is starting to experience some withdrawal symptoms. Past Medical History:  Diagnosis Date   Anxiety    Chronic lower back pain    Depression    DJD (degenerative joint disease)    knees, neck   GERD (gastroesophageal reflux disease)    has had dilitation in the past   Hypertension    Past Surgical History:  Procedure Laterality Date   ABDOMINAL HYSTERECTOMY  03/1979   BREAST BIOPSY Left 1986 and 1987   CATARACT EXTRACTION, BILATERAL Bilateral 2014   Dr. Patrina, in [place]   ESOPHAGOGASTRODUODENOSCOPY N/A 07/01/2023   Procedure: EGD (ESOPHAGOGASTRODUODENOSCOPY);  Surgeon: Federico Rosario BROCKS, MD;  Location: [facility] ENDOSCOPY;  Service: Gastroenterology;  Laterality: N/A;  Sedation provided by ICU   [facility]   INSERTION,  GASTROSTOMY TUBE, ROBOT-ASSISTED Left 07/21/2023   Procedure: INSERTION, GASTROSTOMY TUBE, ROBOT-ASSISTED;  Surgeon: Signe Mitzie LABOR, MD;  Location: [facility] OR;  Service: General;  Laterality: Left;   XI ROBOTIC ASSISTED PARAESOPHAGEAL HERNIA REPAIR N/A 07/21/2023   Procedure: REPAIR, HERNIA, PARAESOPHAGEAL, ROBOT-ASSISTED;  Surgeon: Signe Mitzie LABOR, MD;  Location: [facility] OR;  Service: General;  Laterality: N/A;  ROBOTIC PARAESOPHAGEAL HERNIA REPAIR, ROBOTIC REPAIR OF SPIGELIAN HERNIA   Current Outpatient Medications on File Prior to Visit  Medication Sig Dispense Refill   acetaminophen  (TYLENOL ) 500 MG tablet Take 500 mg by mouth every 6 (six) hours as needed for mild pain (pain score 1-3).     albuterol  (VENTOLIN  HFA) 108 (90 Base) MCG/ACT inhaler Inhale 2 puffs into the lungs every 6 (six) hours as needed for wheezing or shortness of breath. 8 g 2   amiodarone  (PACERONE ) 200 MG tablet Take 1 tablet (200 mg total) by mouth daily. 90 tablet 2   apixaban  (ELIQUIS ) 5 MG TABS tablet Take 1 tablet (5 mg total) by mouth 2 (two) times daily. 200 tablet 2   Ascorbic Acid (VITAMIN C) 500 MG CAPS Take 500 mg by mouth in the morning.     busPIRone  (BUSPAR ) 5 MG tablet Take 1 tablet (5 mg total) by mouth 3 (three) times daily. 270 tablet 1   Cholecalciferol (VITAMIN D3) 125 MCG (5000 UT)  TABS Take 5,000 Units by mouth every evening.     Cyanocobalamin  (VITAMIN B-12) 2500 MCG SUBL Take 2,500 mcg by mouth every Friday.     ferrous sulfate  325 (65 FE) MG tablet Take 1 tablet (325 mg total) by mouth every other day.     FIBER PO Take 1 capsule by mouth every evening.     furosemide  (LASIX ) 20 MG tablet Take 1 tablet (20 mg total) by mouth daily. 90 tablet 1   loratadine (CLARITIN) 10 MG tablet Take 10 mg by mouth in the morning.     metoCLOPramide  (REGLAN ) 10 MG tablet Take 1 tablet (10 mg total) by mouth 4 (four) times daily -  before meals and at bedtime. 120 tablet 3   metoCLOPramide  (REGLAN ) 10 MG tablet Take 1  tablet (10 mg total) by mouth 4 (four) times daily. 12 tablet 0   metoprolol  succinate (TOPROL -XL) 50 MG 24 hr tablet Take 1 tablet (50 mg total) by mouth daily. 90 tablet 3   nitroGLYCERIN  (NITROSTAT ) 0.4 MG SL tablet Place 1 tablet (0.4 mg total) under the tongue every 5 (five) minutes as needed for chest pain. 25 tablet 3   nystatin (MYCOSTATIN/NYSTOP) powder Apply 1 Application topically 2 (two) times daily as needed (skin irritation.).     pantoprazole  (PROTONIX ) 40 MG tablet Take 1 tablet (40 mg total) by mouth 2 (two) times daily. 200 tablet 1   Potassium 99 MG TABS Take 99 mg by mouth every evening.     Probiotic Product (PROBIOTIC DAILY PO) Take 1 tablet by mouth in the morning.     No current facility-administered medications on file prior to visit.   Allergies  Allergen Reactions   Cephalosporins Rash    Tolerated ceftriaxone  06/2023   Keflex [Cephalexin] Rash   Social History   Socioeconomic History   Marital status: Widowed    Spouse name: Not on file   Number of children: 2   Years of education: Not on file   Highest education level: 12th grade  Occupational History   Occupation: retired    Comment: worked in Designer, fashion/clothing  Tobacco Use   Smoking status: Never   Smokeless tobacco: Never  Vaping Use   Vaping status: Never Used  Substance and Sexual Activity   Alcohol use: Yes    Alcohol/week: 3.0 standard drinks of alcohol    Types: 3 Standard drinks or equivalent per week    Comment: 3 nights out of the week   Drug use: No   Sexual activity: Not Currently  Other Topics Concern   Not on file  Social History Narrative   Not on file   Social Drivers of Health   Financial Resource Strain: Low Risk  (02/03/2021)   Overall Financial Resource Strain (CARDIA)    Difficulty of Paying Living Expenses: Not very hard  Food Insecurity: No Food Insecurity (08/31/2023)   Hunger Vital Sign    Worried About Running Out of Food in the Last Year: Never true    Ran Out of Food  in the Last Year: Never true  Transportation Needs: No Transportation Needs (08/31/2023)   PRAPARE - Administrator, Civil Service (Medical): No    Lack of Transportation (Non-Medical): No  Physical Activity: Inactive (04/28/2019)   Exercise Vital Sign    Days of Exercise per Week: 0 days    Minutes of Exercise per Session: 0 min  Stress: No Stress Concern Present (04/28/2019)   [employer] of Occupational Health -  Occupational Stress Questionnaire    Feeling of Stress : Only a little  Social Connections: Socially Isolated (08/31/2023)   Social Connection and Isolation Panel    Frequency of Communication with Friends and Family: Never    Frequency of Social Gatherings with Friends and Family: Never    Attends Religious Services: Never    Database administrator or Organizations: No    Attends Banker Meetings: Never    Marital Status: Widowed  Intimate Partner Violence: Not At Risk (08/31/2023)   Humiliation, Afraid, Rape, and Kick questionnaire    Fear of Current or Ex-Partner: No    Emotionally Abused: No    Physically Abused: No    Sexually Abused: No     Review of Systems  Gastrointestinal:  Positive for diarrhea.  All other systems reviewed and are negative.      Objective:   Physical Exam Constitutional:      Appearance: Normal appearance. She is normal weight.  Cardiovascular:     Rate and Rhythm: Normal rate. Rhythm irregular.     Heart sounds: Normal heart sounds.  Pulmonary:     Effort: Pulmonary effort is normal. No respiratory distress.     Breath sounds: Normal breath sounds. No wheezing or rales.  Abdominal:     General: Abdomen is flat. Bowel sounds are normal. There is no distension.     Palpations: Abdomen is soft.     Tenderness: There is no abdominal tenderness. There is no guarding or rebound.  Neurological:     Mental Status: She is alert.           Assessment & Plan:  Overflow diarrhea Situation sounds like  overflow diarrhea.  Recommended starting Linzess  72 mcg daily.  I believe the patient has a bowel movement on a daily basis she will not experience overflow diarrhea after 3 to 4 days of not having a bowel movement.  If insurance will not pay for the Linzess  we could try Senokot 2 tablets daily.  Recommended starting Lexapro  5 mg a day to wean off of the Lexapro  to avoid withdrawal symptoms.  Continue to wean off when she sees her PCP next week.

## 2023-12-03 ENCOUNTER — Encounter (HOSPITAL_COMMUNITY): Payer: Self-pay

## 2023-12-03 NOTE — Pre-Procedure Instructions (Signed)
 Surgical Instructions   Your procedure is scheduled on December 08, 2023. Report to [facility] Main Entrance A at 5:30 A.M., then check in with the Admitting office. Any questions or running late day of surgery: call [phone]  Questions prior to your surgery date: call [phone], Monday-Friday, 8am-4pm. If you experience any cold or flu symptoms such as cough, fever, chills, shortness of breath, etc. between now and your scheduled surgery, please notify us  at the above number.     Remember:  Do not eat after midnight the night before your surgery   You may drink clear liquids until 4:30 AM the morning of your surgery.   Clear liquids allowed are: Water , Non-Citrus Juices (without pulp), Carbonated Beverages, Clear Tea (no milk, honey, etc.), Black Coffee Only (NO MILK, CREAM OR POWDERED CREAMER of any kind), and Gatorade.    Take these medicines the morning of surgery with A SIP OF WATER : amiodarone  (PACERONE )  busPIRone  (BUSPAR )  escitalopram  (LEXAPRO )  linaclotide  (LINZESS )  loratadine (CLARITIN)  metoCLOPramide  (REGLAN )  metoprolol  succinate (TOPROL -XL)  pantoprazole  (PROTONIX )    May take these medicines IF NEEDED: acetaminophen  (TYLENOL )  albuterol  (VENTOLIN  HFA) inhaler - please bring inhaler with you morning of surgery nitroGLYCERIN  (NITROSTAT ) - if dose taken prior to surgery, please call [phone]   STOP taking your apixaban  (ELIQUIS ) two days prior to surgery. Your last dose will be September 21st.   One week prior to surgery, STOP taking any Aspirin  (unless otherwise instructed by your surgeon) Aleve, Naproxen, Ibuprofen, Motrin, Advil, Goody's, BC's, all herbal medications, fish oil, and non-prescription vitamins.                     Do NOT Smoke (Tobacco/Vaping) for 24 hours prior to your procedure.  If you use a CPAP at night, you may bring your mask/headgear for your overnight stay.   You will be asked to remove any contacts, glasses,  piercing's, hearing aid's, dentures/partials prior to surgery. Please bring cases for these items if needed.    Patients discharged the day of surgery will not be allowed to drive home, and someone needs to stay with them for 24 hours.  SURGICAL WAITING ROOM VISITATION Patients may have no more than 2 support people in the waiting area - these visitors may rotate.   Pre-op nurse will coordinate an appropriate time for 1 ADULT support person, who may not rotate, to accompany patient in pre-op.  Children under the age of 24 must have an adult with them who is not the patient and must remain in the main waiting area with an adult.  If the patient needs to stay at the hospital during part of their recovery, the visitor guidelines for inpatient rooms apply.  Please refer to the [facility] website for the visitor guidelines for any additional information.   If you received a COVID test during your pre-op visit  it is requested that you wear a mask when out in public, stay away from anyone that may not be feeling well and notify your surgeon if you develop symptoms. If you have been in contact with anyone that has tested positive in the last 10 days please notify you surgeon.      Pre-operative CHG Bathing Instructions   You can play a key role in reducing the risk of infection after surgery. Your skin needs to be as free of germs as possible. You can reduce the number of germs on your skin by washing with CHG (chlorhexidine   gluconate) soap before surgery. CHG is an antiseptic soap that kills germs and continues to kill germs even after washing.   DO NOT use if you have an allergy to chlorhexidine /CHG or antibacterial soaps. If your skin becomes reddened or irritated, stop using the CHG and notify one of our RNs at [phone].              TAKE A SHOWER THE NIGHT BEFORE SURGERY AND THE DAY OF SURGERY    Please keep in mind the following:  DO NOT shave, including legs and underarms, 48 hours  prior to surgery.   You may shave your face before/day of surgery.  Place clean sheets on your bed the night before surgery Use a clean washcloth (not used since being washed) for each shower. DO NOT sleep with pet's night before surgery.  CHG Shower Instructions:  Wash your face and private area with normal soap. If you choose to wash your hair, wash first with your normal shampoo.  After you use shampoo/soap, rinse your hair and body thoroughly to remove shampoo/soap residue.  Turn the water  OFF and apply half the bottle of CHG soap to a CLEAN washcloth.  Apply CHG soap ONLY FROM YOUR NECK DOWN TO YOUR TOES (washing for 3-5 minutes)  DO NOT use CHG soap on face, private areas, open wounds, or sores.  Pay special attention to the area where your surgery is being performed.  If you are having back surgery, having someone wash your back for you may be helpful. Wait 2 minutes after CHG soap is applied, then you may rinse off the CHG soap.  Pat dry with a clean towel  Put on clean pajamas    Additional instructions for the day of surgery: DO NOT APPLY any lotions, deodorants, cologne, or perfumes.   Do not wear jewelry or makeup Do not wear nail polish, gel polish, artificial nails, or any other type of covering on natural nails (fingers and toes) Do not bring valuables to the hospital. [facility] is not responsible for valuables/personal belongings. Put on clean/comfortable clothes.  Please brush your teeth.  Ask your nurse before applying any prescription medications to the skin.

## 2023-12-06 ENCOUNTER — Other Ambulatory Visit: Payer: Self-pay

## 2023-12-06 ENCOUNTER — Encounter (HOSPITAL_COMMUNITY): Payer: Self-pay

## 2023-12-06 ENCOUNTER — Encounter (HOSPITAL_COMMUNITY)
Admission: RE | Admit: 2023-12-06 | Discharge: 2023-12-06 | Disposition: A | Discharge status: Home / Self Care | Source: Ambulatory Visit | Attending: Surgery | Admitting: Surgery

## 2023-12-06 VITALS — BP 131/88 | HR 77 | Temp 98.5°F | Resp 17 | Ht 70.0 in | Wt 214.9 lb

## 2023-12-06 DIAGNOSIS — I517 Cardiomegaly: Secondary | ICD-10-CM | POA: Diagnosis not present

## 2023-12-06 DIAGNOSIS — I358 Other nonrheumatic aortic valve disorders: Secondary | ICD-10-CM | POA: Diagnosis not present

## 2023-12-06 DIAGNOSIS — I2510 Atherosclerotic heart disease of native coronary artery without angina pectoris: Secondary | ICD-10-CM | POA: Diagnosis not present

## 2023-12-06 DIAGNOSIS — I071 Rheumatic tricuspid insufficiency: Secondary | ICD-10-CM | POA: Insufficient documentation

## 2023-12-06 DIAGNOSIS — G4733 Obstructive sleep apnea (adult) (pediatric): Secondary | ICD-10-CM | POA: Diagnosis not present

## 2023-12-06 DIAGNOSIS — Z01818 Encounter for other preprocedural examination: Secondary | ICD-10-CM

## 2023-12-06 DIAGNOSIS — I4891 Unspecified atrial fibrillation: Secondary | ICD-10-CM | POA: Insufficient documentation

## 2023-12-06 DIAGNOSIS — I272 Pulmonary hypertension, unspecified: Secondary | ICD-10-CM | POA: Diagnosis not present

## 2023-12-06 DIAGNOSIS — Z01812 Encounter for preprocedural laboratory examination: Secondary | ICD-10-CM | POA: Insufficient documentation

## 2023-12-06 DIAGNOSIS — Z7901 Long term (current) use of anticoagulants: Secondary | ICD-10-CM | POA: Diagnosis not present

## 2023-12-06 HISTORY — DX: Sleep apnea, unspecified: G47.30

## 2023-12-06 HISTORY — DX: Dyspnea, unspecified: R06.00

## 2023-12-06 HISTORY — DX: Pulmonary hypertension, unspecified: I27.20

## 2023-12-06 HISTORY — DX: Cardiac arrhythmia, unspecified: I49.9

## 2023-12-06 HISTORY — DX: Anemia, unspecified: D64.9

## 2023-12-06 HISTORY — DX: Personal history of other diseases of the digestive system: Z87.19

## 2023-12-06 LAB — SURGICAL PCR SCREEN
MRSA, PCR: POSITIVE — AB
Staphylococcus aureus: POSITIVE — AB

## 2023-12-06 LAB — CBC
HCT: 39.3 % (ref 36.0–46.0)
Hemoglobin: 12.8 g/dL (ref 12.0–15.0)
MCH: 32.2 pg (ref 26.0–34.0)
MCHC: 32.6 g/dL (ref 30.0–36.0)
MCV: 98.7 fL (ref 80.0–100.0)
Platelets: 169 10*3/uL (ref 150–400)
RBC: 3.98 MIL/uL (ref 3.87–5.11)
RDW: 14.6 % (ref 11.5–15.5)
WBC: 5 10*3/uL (ref 4.0–10.5)
nRBC: 0 % (ref 0.0–0.2)

## 2023-12-06 LAB — BASIC METABOLIC PANEL WITH GFR
Anion gap: 8 (ref 5–15)
BUN: 12 mg/dL (ref 8–23)
CO2: 27 mmol/L (ref 22–32)
Calcium: 9.2 mg/dL (ref 8.9–10.3)
Chloride: 104 mmol/L (ref 98–111)
Creatinine, Ser: 0.81 mg/dL (ref 0.44–1.00)
GFR, Estimated: >60 mL/min (ref >60)
Glucose, Bld: 101 mg/dL — ABNORMAL HIGH (ref 70–99)
Potassium: 3.7 mmol/L (ref 3.5–5.1)
Sodium: 139 mmol/L (ref 135–145)

## 2023-12-06 NOTE — Progress Notes (Signed)
 PCP - [name] FNP- pt reports that she goes to see her PCP tomorrow for a wellness visit.  Cardiologist - Dr Dorn Ross  PPM/ICD - denies   Chest x-ray - 08/31/23 EKG - 08/31/23 Stress Test - 12/08/19 ECHO - 06/30/23 Cardiac Cath - denies  Sleep Study - Pt reports that she has sleep apnea, but does not wear a CPAP   Fasting Blood Sugar - N/A   Last dose of GLP1 agonist-  N/A   Blood Thinner Instructions: Pt reports that her last dose of Eliquis  was 12/05/23 in the evening   ERAS Protcol - ERAS per order   COVID TEST- N/A   Anesthesia review: Pt in hospital in June for Pneumonia/sepsis. Pt reports that she feels like she is back to her baseline from this. Pt states that she will get some shortness of breath and always feels weak d/t her Afib. Pt states that she was told years ago by a gynecologist that she had a heart murmur. Pt states that she has not been told this by anyone else since. Pt goes to her PCP for a wellness check tomorrow. Pt has cardiac clearance in epic.   Patient denies shortness of breath, fever, cough and chest pain at PAT appointment. Pt denies any recent cardiac symptoms.    All instructions explained to the patient, with a verbal understanding of the material. Patient agrees to go over the instructions while at home for a better understanding.  The opportunity to ask questions was provided.

## 2023-12-06 NOTE — Progress Notes (Signed)
 MRSA PCR positive for MRSA/MSSA. [facility] nurse Sari notified.

## 2023-12-07 ENCOUNTER — Ambulatory Visit: Admitting: Family Medicine

## 2023-12-07 ENCOUNTER — Encounter: Payer: Self-pay | Admitting: Family Medicine

## 2023-12-07 VITALS — BP 107/82 | HR 67 | Temp 97.6°F | Ht 70.0 in | Wt 215.0 lb

## 2023-12-07 DIAGNOSIS — G244 Idiopathic orofacial dystonia: Secondary | ICD-10-CM | POA: Insufficient documentation

## 2023-12-07 DIAGNOSIS — R6884 Jaw pain: Secondary | ICD-10-CM

## 2023-12-07 DIAGNOSIS — Z0000 Encounter for general adult medical examination without abnormal findings: Secondary | ICD-10-CM | POA: Diagnosis not present

## 2023-12-07 NOTE — Progress Notes (Signed)
 Subjective:   Darlene [name] is a 83 y.o. female who presents for Medicare Annual (Subsequent) preventive examination.  Visit Complete: Virtual I connected with  [name] Alert on 12/07/23 by a audio enabled telemedicine application and verified that I am speaking with the correct person using two identifiers.  Patient Location: Other:  home   Provider Location: Office/Clinic  I discussed the limitations of evaluation and management by telemedicine. The patient expressed understanding and agreed to proceed.  Vital Signs: Because this visit was a virtual/telehealth visit, some criteria may be missing or patient reported. Any vitals not documented were not able to be obtained and vitals that have been documented are patient reported.  Patient Medicare AWV questionnaire was completed by the patient on 12/07/2023; I have confirmed that all information answered by patient is correct and no changes since this date.  Cardiac Risk Factors include: sedentary lifestyle;hypertension;family history of premature cardiovascular disease     Objective:    Today's Vitals   12/07/23 1052  PainSc: 5    There is no height or weight on file to calculate BMI.     12/07/2023   11:19 AM 12/06/2023   10:21 AM 08/31/2023    8:29 PM 08/31/2023    9:55 AM 07/21/2023    7:33 AM 07/01/2023    1:48 PM 06/29/2023    6:21 PM  Advanced Directives  Does Patient Have a Medical Advance Directive? Yes Yes  Yes Yes Unable to assess, patient is non-responsive or altered mental status Yes  Type of Advance Directive Living will;Healthcare Power of [place];Out of facility DNR (pink MOST or yellow form) Healthcare Power of [place];Living will  Living will Healthcare Power of [place];Living will  Healthcare Power of [place];Living will  Does patient want to make changes to medical advance directive? Yes (Inpatient - patient defers changing a medical advance directive at this time - Information given)  No - Patient declined     No - Patient declined  Copy of Healthcare Power of Attorney in Chart? Yes - validated most recent copy scanned in chart (See row information) Yes - validated most recent copy scanned in chart (See row information)     No - copy requested    Current Medications (verified) Outpatient Encounter Medications as of 12/07/2023  Medication Sig   acetaminophen  (TYLENOL ) 500 MG tablet Take 500 mg by mouth every 6 (six) hours as needed for mild pain (pain score 1-3).   albuterol  (VENTOLIN  HFA) 108 (90 Base) MCG/ACT inhaler Inhale 2 puffs into the lungs every 6 (six) hours as needed for wheezing or shortness of breath.   amiodarone  (PACERONE ) 200 MG tablet Take 1 tablet (200 mg total) by mouth daily.   apixaban  (ELIQUIS ) 5 MG TABS tablet Take 1 tablet (5 mg total) by mouth 2 (two) times daily.   Ascorbic Acid (VITAMIN C) 500 MG CAPS Take 500 mg by mouth in the morning.   busPIRone  (BUSPAR ) 5 MG tablet Take 1 tablet (5 mg total) by mouth 3 (three) times daily.   Cholecalciferol (VITAMIN D3) 125 MCG (5000 UT) TABS Take 5,000 Units by mouth every evening.   Cyanocobalamin  (VITAMIN B-12) 2500 MCG SUBL Take 2,500 mcg by mouth every Friday.   escitalopram  (LEXAPRO ) 5 MG tablet Take 1 tablet (5 mg total) by mouth daily.   ferrous sulfate  325 (65 FE) MG tablet Take 1 tablet (325 mg total) by mouth every other day.   FIBER PO Take 1 capsule by mouth every evening.  furosemide  (LASIX ) 20 MG tablet Take 1 tablet (20 mg total) by mouth daily.   linaclotide  (LINZESS ) 72 MCG capsule Take 1 capsule (72 mcg total) by mouth daily before breakfast.   loratadine (CLARITIN) 10 MG tablet Take 10 mg by mouth in the morning.   metoprolol  succinate (TOPROL -XL) 50 MG 24 hr tablet Take 1 tablet (50 mg total) by mouth daily.   mupirocin  ointment (BACTROBAN ) 2 % Apply 1 Application topically 2 (two) times daily.   nitroGLYCERIN  (NITROSTAT ) 0.4 MG SL tablet Place 1 tablet (0.4 mg total) under the tongue every 5 (five) minutes as  needed for chest pain.   pantoprazole  (PROTONIX ) 40 MG tablet Take 1 tablet (40 mg total) by mouth 2 (two) times daily.   Potassium 99 MG TABS Take 99 mg by mouth every evening.   Probiotic Product (PROBIOTIC DAILY PO) Take 1 tablet by mouth in the morning.   [DISCONTINUED] metoCLOPramide  (REGLAN ) 10 MG tablet Take 1 tablet (10 mg total) by mouth 4 (four) times daily -  before meals and at bedtime.   [DISCONTINUED] metoCLOPramide  (REGLAN ) 10 MG tablet Take 1 tablet (10 mg total) by mouth 4 (four) times daily.   No facility-administered encounter medications on file as of 12/07/2023.    Allergies (verified) Cephalosporins and Keflex [cephalexin]   History: Past Medical History:  Diagnosis Date   Anemia    Anxiety    Chronic lower back pain    Depression    DJD (degenerative joint disease)    knees, neck   Dyspnea    Dysrhythmia    A. Fib - on Eliquis    GERD (gastroesophageal reflux disease)    has had dilitation in the past   History of hiatal hernia    Hypertension    Pneumonia 08/2023   after aspiration in surgery- pt was in ICU   Pulmonary hypertension [facility])    Sleep apnea    Past Surgical History:  Procedure Laterality Date   ABDOMINAL HYSTERECTOMY  03/1979   BREAST BIOPSY Left 1986 and 1987   CATARACT EXTRACTION, BILATERAL Bilateral 2014   Dr. Patrina, in [place]   ESOPHAGOGASTRODUODENOSCOPY N/A 07/01/2023   Procedure: EGD (ESOPHAGOGASTRODUODENOSCOPY);  Surgeon: Federico Rosario BROCKS, MD;  Location: [facility] ENDOSCOPY;  Service: Gastroenterology;  Laterality: N/A;  Sedation provided by ICU   [facility]  [facility]   INSERTION, GASTROSTOMY TUBE, ROBOT-ASSISTED Left 07/21/2023   Procedure: INSERTION, GASTROSTOMY TUBE, ROBOT-ASSISTED;  Surgeon: Signe Mitzie LABOR, MD;  Location: [facility] OR;  Service: General;  Laterality: Left;   XI ROBOTIC ASSISTED PARAESOPHAGEAL HERNIA REPAIR N/A 07/21/2023   Procedure: REPAIR, HERNIA, PARAESOPHAGEAL, ROBOT-ASSISTED;   Surgeon: Signe Mitzie LABOR, MD;  Location: [facility] OR;  Service: General;  Laterality: N/A;  ROBOTIC PARAESOPHAGEAL HERNIA REPAIR, ROBOTIC REPAIR OF SPIGELIAN HERNIA   Family History  Problem Relation Age of Onset   Hypertension Mother    Heart disease Mother    Hypertension Father    Heart disease Father    Tremor Sister    Dementia Brother    Hypertension Daughter    Colon cancer Neg Hx    Esophageal cancer Neg Hx    Liver disease Neg Hx    Social History   Socioeconomic History   Marital status: Widowed    Spouse name: Not on file   Number of children: 2   Years of education: Not on file   Highest education level: 12th grade  Occupational History  Occupation: retired    Comment: worked in Designer, fashion/clothing  Tobacco Use   Smoking status: Never   Smokeless tobacco: Never  Vaping Use   Vaping status: Never Used  Substance and Sexual Activity   Alcohol use: Not Currently    Comment: 3 drinks per month   Drug use: No   Sexual activity: Not Currently  Other Topics Concern   Not on file  Social History Narrative   Not on file   Social Drivers of Health   Financial Resource Strain: Low Risk  (02/03/2021)   Overall Financial Resource Strain (CARDIA)    Difficulty of Paying Living Expenses: Not very hard  Food Insecurity: No Food Insecurity (08/31/2023)   Hunger Vital Sign    Worried About Running Out of Food in the Last Year: Never true    Ran Out of Food in the Last Year: Never true  Transportation Needs: No Transportation Needs (08/31/2023)   PRAPARE - Administrator, Civil Service (Medical): No    Lack of Transportation (Non-Medical): No  Physical Activity: Inactive (04/28/2019)   Exercise Vital Sign    Days of Exercise per Week: 0 days    Minutes of Exercise per Session: 0 min  Stress: No Stress Concern Present (04/28/2019)   [employer] of Occupational Health - Occupational Stress Questionnaire    Feeling of Stress : Only a little  Social Connections:  Socially Isolated (08/31/2023)   Social Connection and Isolation Panel    Frequency of Communication with Friends and Family: Never    Frequency of Social Gatherings with Friends and Family: Never    Attends Religious Services: Never    Database administrator or Organizations: No    Attends Banker Meetings: Never    Marital Status: Widowed    Tobacco Counseling Counseling given: Not Answered   Clinical Intake:  Pre-visit preparation completed: Yes  Pain : 0-10 Pain Score: 5  Pain Type: Chronic pain Pain Location: Abdomen Pain Orientation: Right Pain Radiating Towards: hernia pain towards the rt side of abdomen Pain Descriptors / Indicators: Nagging, Other (Comment) (pressure type pain that comes and goes) Pain Onset: More than a month ago Pain Frequency: Constant Pain Relieving Factors: Tylenol . Effect of Pain on Daily Activities: no  Pain Relieving Factors: Tylenol .  Diabetes: No  How often do you need to have someone help you when you read instructions, pamphlets, or other written materials from your doctor or pharmacy?: 2 - Rarely (some times needs assistance w/ understanding bills or Doctor information.) What is the last grade level you completed in school?: 12 th grade.  Interpreter Needed?: No  Information entered by :: Sheena P.   Activities of Daily Living    12/07/2023   11:01 AM 12/06/2023   10:24 AM  In your present state of health, do you have any difficulty performing the following activities:  Hearing? 0   Vision? 1   Comment glasses are for regular vision.   Difficulty concentrating or making decisions? 0   Walking or climbing stairs? 1   Comment can not climb stairs. poor balance. she has weakness in legs.   Dressing or bathing? 0   Doing errands, shopping? 1 1  Comment daughter drives her everywhere.   Preparing Food and eating ? Y   Comment can prepare basic meals such as a sandwich or a can of soup.   Using the Toilet? N   In  the past six months, have you accidently leaked urine?  Y   Do you have problems with loss of bowel control? Y   Managing your Medications? Y   Comment daughter prepares her medication box she can take them daily.   Managing your Finances? Y   Comment daugher explaines what is being paid . so that pt. is aware.   Housekeeping or managing your Housekeeping? Y   Comment pt. has a daughter and Programmer, applications to help w/ house chores.     Patient Care Team: Kayla Jeoffrey RAMAN, FNP as PCP - General (Family Medicine) Alvan Dorn FALCON, MD as PCP - Cardiology (Cardiology)  Indicate any recent Medical Services you may have received from other than [facility] providers in the past year (date may be approximate).     Assessment:   This is a routine wellness examination for Darlene [name].  Hearing/Vision screen No results found.   Goals Addressed             This Visit's Progress    Stay Active and Independent-Low Back Pain       Timeframe:  Short-Term Goal Priority:  High Start Date:  12/07/2023                           Expected End Date:   06/05/2024                    Follow Up Date 06/05/2024    - get out for a short walk before breakfast, after dinner or both work on leg strength.     Why is this important?   Regular activity or exercise is important to managing back pain.  Activity helps to keep your muscles strong.  You will sleep better and feel more relaxed.  You will have more energy and feel less stressed.  If you are not active now, start slowly. Little changes make a big difference.  Rest, but not too much.  Stay as active as you can and listen to your body's signals.     Notes:she is getting older and wants to keep up her strength.        Depression Screen    12/07/2023   11:14 AM 09/13/2023    3:11 PM 06/15/2023   10:00 AM 08/11/2022    3:10 PM 10/29/2021    2:11 AM 07/17/2021   11:24 AM 02/03/2021   11:40 AM  PHQ 2/9 Scores  PHQ - 2 Score 3 2 1  0 0 1 1  PHQ- 9 Score 8 4 7          Fall Risk    12/07/2023   11:11 AM 09/28/2023   10:25 AM 06/15/2023   10:00 AM 08/11/2022    3:10 PM 12/02/2021   12:02 AM  Fall Risk   Falls in the past year? 0 0 1 0   Number falls in past yr: 0 0 1    Injury with Fall? 0 0 1    Comment   L knee injury    Risk for fall due to : No Fall Risks No Fall Risks   No Fall Risks  Follow up Falls evaluation completed Falls evaluation completed   Falls prevention discussed;Education provided;Falls evaluation completed      Data saved with a previous flowsheet row definition    MEDICARE RISK AT HOME: Medicare Risk at Home Any stairs in or around the home?: Yes (she does not have access to them. the house is two story.) If so, are  there any without handrails?: Yes Home free of loose throw rugs in walkways, pet beds, electrical cords, etc?: Yes Adequate lighting in your home to reduce risk of falls?: Yes Life alert?: No (has a cell phone that she keeps with her.) Use of a cane, walker or w/c?: Yes (uses a cane and walker) Grab bars in the bathroom?: Yes (she has a walk in shower w/ grab bars inside and out side.) Shower chair or bench in shower?: Yes Elevated toilet seat or a handicapped toilet?: Yes (elevated toilet seat w/ handles.)  TIMED UP AND GO:  Was the test performed?  No    Cognitive Function:        12/07/2023   11:17 AM  6CIT Screen  What Year? 0 points  What month? 0 points  What time? 0 points  Count back from 20 0 points  Months in reverse 0 points  Repeat phrase 0 points  Total Score 0 points    Immunizations Immunization History  Administered Date(s) Administered   Fluad Quad(high Dose 65+) 12/04/2019, 12/21/2020   INFLUENZA, HIGH DOSE SEASONAL PF 01/29/2018, 02/03/2022, 11/30/2022   Influenza-Unspecified 12/09/2016, 11/28/2018   Moderna Covid-19 Fall Seasonal Vaccine 45yrs & older 03/28/2019, 04/28/2019, 02/27/2020, 11/30/2022   Moderna Sars-Covid-2 Vaccination 03/28/2019, 04/28/2019, 02/27/2020    Pneumococcal Conjugate-13 02/12/2016, 09/30/2017   Pneumococcal Polysaccharide-23 03/16/2006, 04/02/2008   Tdap 08/31/2022   Varicella 07/31/2013, 06/15/2023   Zoster Recombinant(Shingrix ) 06/15/2023   Zoster, Live 07/31/2013    TDAP status: Up to date  Flu Vaccine status: Due, Education has been provided regarding the importance of this vaccine. Advised may receive this vaccine at local pharmacy or Health Dept. Aware to provide a copy of the vaccination record if obtained from local pharmacy or Health Dept. Verbalized acceptance and understanding.  Pneumococcal vaccine status: Up to date  Covid-19 vaccine status: Declined, Education has been provided regarding the importance of this vaccine but patient still declined. Advised may receive this vaccine at local pharmacy or Health Dept.or vaccine clinic. Aware to provide a copy of the vaccination record if obtained from local pharmacy or Health Dept. Verbalized acceptance and understanding.  Qualifies for Shingles Vaccine? Yes   Zostavax completed No   Shingrix  Completed?: No.    Education has been provided regarding the importance of this vaccine. Patient has been advised to call insurance company to determine out of pocket expense if they have not yet received this vaccine. Advised may also receive vaccine at local pharmacy or Health Dept. Verbalized acceptance and understanding.  Screening Tests Health Maintenance  Topic Date Due   DEXA SCAN  Never done   Medicare Annual Wellness (AWV)  08/11/2023   COVID-19 Vaccine (8 - Moderna risk 2024-25 season) 12/23/2023 (Originally 11/15/2023)   Influenza Vaccine  12/29/2023 (Originally 10/15/2023)   Zoster Vaccines- Shingrix  (2 of 2) 12/29/2023 (Originally 08/10/2023)   Mammogram  05/27/2025   DTaP/Tdap/Td (2 - Td or Tdap) 08/30/2032   Pneumococcal Vaccine: 50+ Years  Completed   HPV VACCINES  Aged Out   Meningococcal B Vaccine  Aged Out   Colonoscopy  Discontinued    Health  Maintenance  Health Maintenance Due  Topic Date Due   DEXA SCAN  Never done   Medicare Annual Wellness (AWV)  08/11/2023    Colorectal cancer screening: No longer required.   Mammogram status: Completed 05/28/2023. Repeat every year02 years  Bone Density status: Ordered 06/15/2023. Pt provided with contact info and advised to call to schedule appt.  Lung Cancer Screening: (  Low Dose CT Chest recommended if Age 35-80 years, 20 pack-year currently smoking OR have quit w/in 15years.) does not qualify.   Lung Cancer Screening Referral: n/a  Additional Screening:  Hepatitis C Screening: does not qualify; Completed n/a  Vision Screening: Recommended annual ophthalmology exams for early detection of glaucoma and other disorders of the eye. Is the patient up to date with their annual eye exam?  Yes  Who is the provider or what is the name of the office in which the patient attends annual eye exams? N/a If pt is not established with a provider, would they like to be referred to a provider to establish care? No .   Dental Screening: Recommended annual dental exams for proper oral hygiene  Diabetic Foot Exam: Diabetic Foot Exam: Overdue, Pt has been advised about the importance in completing this exam. Pt is scheduled for diabetic foot exam on 12/07/23.  Community Resource Referral / Chronic Care Management: CRR required this visit?  No   CCM required this visit?  No     Plan:     I have personally reviewed and noted the following in the patient's chart:   Medical and social history Use of alcohol, tobacco or illicit drugs  Current medications and supplements including opioid prescriptions. Patient is not currently taking opioid prescriptions. Functional ability and status Nutritional status Physical activity Advanced directives List of other physicians Hospitalizations, surgeries, and ER visits in previous 12 months Vitals Screenings to include cognitive, depression, and  falls Referrals and appointments  In addition, I have reviewed and discussed with patient certain preventive protocols, quality metrics, and best practice recommendations. A written personalized care plan for preventive services as well as general preventive health recommendations were provided to patient.     Jeoffrey GORMAN Barrio, FNP   12/07/2023   After Visit Summary: (MyChart) Due to this being a telephonic visit, the after visit summary with patients personalized plan was offered to patient via MyChart   Nurse Notes: n/a

## 2023-12-07 NOTE — Progress Notes (Signed)
 Subjective:  HPI: Darlene [name] is a 83 y.o. female presenting on 12/07/2023 for Acute Visit (Jaw clenching x 4 weeks )   HPI Discussed the use of AI scribe software for clinical note transcription with the patient, who gave verbal consent to proceed.  History of Present Illness Darlene [name] is an 83 year old female who presents with jaw clenching and pain.  She has been experiencing jaw clenching and pain for the past month, particularly when opening her mouth wide, such as when eating. The pain is localized to the jaw and teeth, especially the back teeth, and is more pronounced on the right side. The clenching has led to soreness in her teeth and jaws, and she is unable to relax her jaw.  About a month ago, she experienced a chin tremble, which resolved after discontinuing Reglan  (metoclopramide ) approximately a week and a half ago. The lip tremble ceased shortly after stopping the medication, but the jaw clenching began subsequently.  She has been on Lexapro  for four to five years and recently ran out of it for three to four days, which she suspects may have contributed to her symptoms. She restarted Lexapro  a week ago. She also takes Buspar  as needed, but not regularly. Other medications include amiodarone , Eliquis , and nystatin powder, none of which are new.  No numbness in the face, forehead, or cheeks. Hearing, vision, smell, and taste are normal. No confusion or mental side effects. She has noticed swelling in her feet recently. She denies grinding her teeth at night but reports clenching them.    Review of Systems  All other systems reviewed and are negative.   Relevant past medical history reviewed and updated as indicated.   Past Medical History:  Diagnosis Date   Anemia    Anxiety    Chronic lower back pain    Depression    DJD (degenerative joint disease)    knees, neck   Dyspnea    Dysrhythmia    A. Fib - on Eliquis    GERD (gastroesophageal reflux disease)     has had dilitation in the past   History of hiatal hernia    Hypertension    Pneumonia 08/2023   after aspiration in surgery- pt was in ICU   Pulmonary hypertension [facility])    Sleep apnea      Past Surgical History:  Procedure Laterality Date   ABDOMINAL HYSTERECTOMY  03/1979   BREAST BIOPSY Left 1986 and 1987   CATARACT EXTRACTION, BILATERAL Bilateral 2014   Dr. Patrina, in [place]   ESOPHAGOGASTRODUODENOSCOPY N/A 07/01/2023   Procedure: EGD (ESOPHAGOGASTRODUODENOSCOPY);  Surgeon: Federico Rosario BROCKS, MD;  Location: [facility] ENDOSCOPY;  Service: Gastroenterology;  Laterality: N/A;  Sedation provided by ICU   [facility]   INSERTION, GASTROSTOMY TUBE, ROBOT-ASSISTED Left 07/21/2023   Procedure: INSERTION, GASTROSTOMY TUBE, ROBOT-ASSISTED;  Surgeon: Signe Mitzie LABOR, MD;  Location: [facility] OR;  Service: General;  Laterality: Left;   XI ROBOTIC ASSISTED PARAESOPHAGEAL HERNIA REPAIR N/A 07/21/2023   Procedure: REPAIR, HERNIA, PARAESOPHAGEAL, ROBOT-ASSISTED;  Surgeon: Signe Mitzie LABOR, MD;  Location: [facility] OR;  Service: General;  Laterality: N/A;  ROBOTIC PARAESOPHAGEAL HERNIA REPAIR, ROBOTIC REPAIR OF SPIGELIAN HERNIA    Allergies and medications reviewed and updated.   Current Outpatient Medications:    acetaminophen  (TYLENOL ) 500 MG tablet, Take 500 mg by mouth every 6 (six) hours as needed for mild pain (pain score 1-3)., Disp: , Rfl:  albuterol  (VENTOLIN  HFA) 108 (90 Base) MCG/ACT inhaler, Inhale 2 puffs into the lungs every 6 (six) hours as needed for wheezing or shortness of breath., Disp: 8 g, Rfl: 2   amiodarone  (PACERONE ) 200 MG tablet, Take 1 tablet (200 mg total) by mouth daily., Disp: 90 tablet, Rfl: 2   apixaban  (ELIQUIS ) 5 MG TABS tablet, Take 1 tablet (5 mg total) by mouth 2 (two) times daily., Disp: 200 tablet, Rfl: 2   Ascorbic Acid (VITAMIN C) 500 MG CAPS, Take 500 mg by mouth in the morning., Disp: , Rfl:    busPIRone  (BUSPAR ) 5 MG  tablet, Take 1 tablet (5 mg total) by mouth 3 (three) times daily., Disp: 270 tablet, Rfl: 1   Cholecalciferol (VITAMIN D3) 125 MCG (5000 UT) TABS, Take 5,000 Units by mouth every evening., Disp: , Rfl:    Cyanocobalamin  (VITAMIN B-12) 2500 MCG SUBL, Take 2,500 mcg by mouth every Friday., Disp: , Rfl:    escitalopram  (LEXAPRO ) 5 MG tablet, Take 1 tablet (5 mg total) by mouth daily., Disp: 30 tablet, Rfl: 3   ferrous sulfate  325 (65 FE) MG tablet, Take 1 tablet (325 mg total) by mouth every other day., Disp: , Rfl:    FIBER PO, Take 1 capsule by mouth every evening., Disp: , Rfl:    furosemide  (LASIX ) 20 MG tablet, Take 1 tablet (20 mg total) by mouth daily., Disp: 90 tablet, Rfl: 1   linaclotide  (LINZESS ) 72 MCG capsule, Take 1 capsule (72 mcg total) by mouth daily before breakfast., Disp: 30 capsule, Rfl: 3   loratadine (CLARITIN) 10 MG tablet, Take 10 mg by mouth in the morning., Disp: , Rfl:    metoprolol  succinate (TOPROL -XL) 50 MG 24 hr tablet, Take 1 tablet (50 mg total) by mouth daily., Disp: 90 tablet, Rfl: 3   mupirocin  ointment (BACTROBAN ) 2 %, Apply 1 Application topically 2 (two) times daily., Disp: , Rfl:    nitroGLYCERIN  (NITROSTAT ) 0.4 MG SL tablet, Place 1 tablet (0.4 mg total) under the tongue every 5 (five) minutes as needed for chest pain., Disp: 25 tablet, Rfl: 3   pantoprazole  (PROTONIX ) 40 MG tablet, Take 1 tablet (40 mg total) by mouth 2 (two) times daily., Disp: 200 tablet, Rfl: 1   Potassium 99 MG TABS, Take 99 mg by mouth every evening., Disp: , Rfl:    Probiotic Product (PROBIOTIC DAILY PO), Take 1 tablet by mouth in the morning., Disp: , Rfl:   Allergies  Allergen Reactions   Cephalosporins Rash    Tolerated ceftriaxone  06/2023   Keflex [Cephalexin] Rash    Objective:   BP 107/82   Pulse 67   Temp 97.6 F (36.4 C)   Ht 5' 10 (1.778 m)   Wt 215 lb (97.5 kg)   SpO2 96%   BMI 30.85 kg/m      12/07/2023   12:17 PM 12/06/2023    9:52 AM 12/02/2023    3:49 PM   Vitals with BMI  Height 5' 10 5' 10 5' 10  Weight 215 lbs 214 lbs 14 oz 212 lbs  BMI 30.85 30.83 30.42  Systolic 107 131 883  Diastolic 82 88 64  Pulse 67 77 63     Physical Exam Vitals and nursing note reviewed.  Constitutional:      Appearance: Normal appearance. She is normal weight.  HENT:     Head: Normocephalic and atraumatic.  Musculoskeletal:     Cervical back: Normal range of motion. No rigidity.  Skin:  General: Skin is warm and dry.  Neurological:     General: No focal deficit present.     Mental Status: She is alert and oriented to person, place, and time. Mental status is at baseline.     GCS: GCS eye subscore is 4. GCS verbal subscore is 5. GCS motor subscore is 6.     Cranial Nerves: Cranial nerves 2-12 are intact.     Sensory: Sensation is intact.     Motor: Motor function is intact.  Psychiatric:        Attention and Perception: Attention and perception normal.        Mood and Affect: Mood normal.        Behavior: Behavior normal.        Thought Content: Thought content normal.        Judgment: Judgment normal.     Assessment & Plan:  Assessment and Plan Assessment & Plan Medication-induced movement disorder (tardive dyskinesia) with jaw pain and teeth clenching Symptoms likely due to metoclopramide  discontinuation. Escitalopram  cessation may have exacerbated clenching. Potential interaction with amiodarone  noted. - Discontinue metoclopramide . - Consider muscle relaxant at bedtime for jaw clenching, ensuring compatibility with current medications. - Consult with Doctor Pickard regarding potential changes to escitalopram  and buspirone . - Investigate medications for tardive dyskinesia that are safe with current medications. - Consider ENT referral if symptoms persist.   Jaw pain Assessment & Plan: Patient experiencing jaw clenching that is causing pain after recently experiencing a lower lip tremble. Neuro exam WNL. Recently DC Reglan  after  several months of use. Also abruptly stopped Lexapro  20mg  daily several weeks ago. This was resumed at a lower dose by my partner and she is currently taking 5mg  daily. I am concerned she may be experiencing some tardive dyskinesia symptoms related to the extended reglan  use. Advised not to resume this. Will continue low dose lexapro  and monitor symptoms. I am seeing Ms Wente in 2-3 weeks and will follow up on symptoms at that time. She should notify me if symptoms worsen immediately.      Follow up plan: Return as scheduled.  Jeoffrey GORMAN Barrio, FNP

## 2023-12-07 NOTE — Assessment & Plan Note (Signed)
 Patient experiencing jaw clenching that is causing pain after recently experiencing a lower lip tremble. Neuro exam WNL. Recently DC Reglan  after several months of use. Also abruptly stopped Lexapro  20mg  daily several weeks ago. This was resumed at a lower dose by my partner and she is currently taking 5mg  daily. I am concerned she may be experiencing some tardive dyskinesia symptoms related to the extended reglan  use. Advised not to resume this. Will continue low dose lexapro  and monitor symptoms. I am seeing Darlene [name] in 2-3 weeks and will follow up on symptoms at that time. She should notify me if symptoms worsen immediately.

## 2023-12-07 NOTE — Anesthesia Preprocedure Evaluation (Addendum)
 Anesthesia Evaluation  Patient identified by MRN, date of birth, ID band Patient awake    Reviewed: Allergy & Precautions, NPO status , Patient's Chart, lab work & pertinent test results, reviewed documented beta blocker date and time   History of Anesthesia Complications Negative for: history of anesthetic complications  Airway Mallampati: III  TM Distance: >3 FB     Dental no notable dental hx.    Pulmonary shortness of breath, sleep apnea , pneumonia   breath sounds clear to auscultation       Cardiovascular hypertension, (-) angina (-) CAD and (-) Past MI + dysrhythmias Atrial Fibrillation  Rhythm:Regular Rate:Normal  IMPRESSIONS     1. Left ventricular ejection fraction, by estimation, is 50 to 55%. The  left ventricle has low normal function. The left ventricle demonstrates  global hypokinesis. There is mild concentric left ventricular hypertrophy.  Left ventricular diastolic  function could not be evaluated.   2. Akiniesis of the RV free wall with tethering of the apex consistent  with McConnell's sign and acute RV dsyfunction. Right ventricular systolic  function is mildly reduced. The right ventricular size is mildly enlarged.  There is moderately elevated  pulmonary artery systolic pressure.   3. Left atrial size was moderately dilated.   4. A small pericardial effusion is present.   5. The mitral valve is normal in structure. Trivial mitral valve  regurgitation.   6. Tricuspid valve regurgitation is moderate.   7. The aortic valve is tricuspid. Aortic valve regurgitation is trivial.  Aortic valve sclerosis is present, with no evidence of aortic valve  stenosis.   8. The inferior vena cava is dilated in size with <50% respiratory  variability, suggesting right atrial pressure of 15 mmHg.     Neuro/Psych neg Seizures PSYCHIATRIC DISORDERS Anxiety Depression     Neuromuscular disease    GI/Hepatic hiatal  hernia,GERD  ,,(+) neg Cirrhosis        Endo/Other    Renal/GU Renal disease     Musculoskeletal  (+) Arthritis , Osteoarthritis,    Abdominal   Peds  Hematology   Anesthesia Other Findings   Reproductive/Obstetrics                              Anesthesia Physical Anesthesia Plan  ASA: 3  Anesthesia Plan: General   Post-op Pain Management:    Induction: Intravenous  PONV Risk Score and Plan: 2 and Ondansetron  and Dexamethasone   Airway Management Planned: Oral ETT  Additional Equipment:   Intra-op Plan:   Post-operative Plan: Extubation in OR  Informed Consent: I have reviewed the patients History and Physical, chart, labs and discussed the procedure including the risks, benefits and alternatives for the proposed anesthesia with the patient or authorized representative who has indicated his/her understanding and acceptance.     Dental advisory given  Plan Discussed with: CRNA  Anesthesia Plan Comments: (PAT note by Lynwood Hope, PA-C: 83 year old female followed to cardiology for history of A-fib on Eliquis , pulm hypertension, HLD, OSA on CPAP.  Echo 06/2023 showed LVEF 50 to 55%,Akiniesis of the RV free wall with tethering of the apex consistent  with McConnell's sign and acute RV dsyfunction (PE ruled out, cardiology felt due to known pulmonary hypertension, no further intervention recommended), mildly reduced RV systolic function, moderately elevated PASP, moderate tricuspid regurgitation.  Nuclear stress 11/2019 was low risk.  Clearance per telephone encounter by Lamarr Satterfield, NP on 11/26/2023, Chart reviewed as  part of pre-operative protocol coverage. Given past medical history and time since last visit, based on ACC/AHA guidelines, FLOR WHITACRE is at acceptable risk for the planned procedure without further cardiovascular testing. Per office protocol, patient can hold Eliquis  for 2 days prior to procedure.  Patient will not need  bridging with Lovenox (enoxaparin) around procedure. The patient was advised that if she develops new symptoms prior to surgery to contact our office to arrange for a follow-up visit, and she verbalized understanding.  Patient had prolonged admission in April 2024 with GI bleeding and aspiration pneumonia. Also suffered A-fib with RVR related to hypoxia and pulmonary edema/large-volume aspiration.  She was intubated and treated in the ICU for several days.  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  She was extubated on 4/20 and did have ICU delirium.  Echo showed moderate to severe right ventricular dysfunction.  Transiently required pressors due to sepsis from aspiration pneumonia.  Ultimately she was able to be weaned off pressors and oxygen requirement improved.  She was diuresed aggressively.  She did require thoracentesis for a right pleural effusion.  Ultimately was able to transfer to the floor on 4/30.Given these events, it was felt that repairing her hiatal hernia prior to discharge would be prudent to avoid future episodes of aspiration pneumonia.  This was done on 5/7.  She had a slow postoperative course with ongoing poor p.o. intake requiring supplemental tube feeds but was ultimately discharged to SNF on 5/15.  Subsequent admission 6/17 through 09/03/2023 for sepsis secondary to CAP/possible aspiration pneumonia.  Treated with antibiotics with resolution of symptoms.   Preop labs reviewed, WNL.  EKG 08/31/23: Atrial fibrillation.  Rate 96.  TTE 06/30/2023: 1. Left ventricular ejection fraction, by estimation, is 50 to 55%. The  left ventricle has low normal function. The left ventricle demonstrates  global hypokinesis. There is mild concentric left ventricular hypertrophy.  Left ventricular diastolic  function could not be evaluated.   2. Akiniesis of the RV free wall with tethering of the apex consistent  with McConnell's sign and acute RV dsyfunction. Right ventricular  systolic  function is mildly reduced. The right ventricular size is mildly enlarged.  There is moderately elevated  pulmonary artery systolic pressure.   3. Left atrial size was moderately dilated.   4. A small pericardial effusion is present.   5. The mitral valve is normal in structure. Trivial mitral valve  regurgitation.   6. Tricuspid valve regurgitation is moderate.   7. The aortic valve is tricuspid. Aortic valve regurgitation is trivial.  Aortic valve sclerosis is present, with no evidence of aortic valve  stenosis.   8. The inferior vena cava is dilated in size with <50% respiratory  variability, suggesting right atrial pressure of 15 mmHg.   Nuclear stress 12/08/2019:  There was no ST segment deviation noted during stress.  The study is normal. There are no perfusion defect consistentw with prior infarct or current ischemia.  This is a low risk study.  The left ventricular ejection fraction is normal (55-65%).   )         Anesthesia Quick Evaluation

## 2023-12-07 NOTE — Patient Instructions (Signed)
  Darlene [name] , Thank you for taking time to come for your Medicare Wellness Visit. I appreciate your ongoing commitment to your health goals. Please review the following plan we discussed and let me know if I can assist you in the future.   These are the goals we discussed:  Goals      DIET - INCREASE WATER  INTAKE     DIET - REDUCE FAST FOOD INTAKE     DIET - REDUCE PORTION SIZE     Exercise 3x per week (30 min per time)     Stay Active and Independent-Low Back Pain     Timeframe:  Short-Term Goal Priority:  High Start Date:  12/07/2023                           Expected End Date:   06/05/2024                    Follow Up Date 06/05/2024    - get out for a short walk before breakfast, after dinner or both work on leg strength.     Why is this important?   Regular activity or exercise is important to managing back pain.  Activity helps to keep your muscles strong.  You will sleep better and feel more relaxed.  You will have more energy and feel less stressed.  If you are not active now, start slowly. Little changes make a big difference.  Rest, but not too much.  Stay as active as you can and listen to your body's signals.     Notes:she is getting older and wants to keep up her strength.         This is a list of the screening recommended for you and due dates:  Health Maintenance  Topic Date Due   DEXA scan (bone density measurement)  Never done   Medicare Annual Wellness Visit  08/11/2023   COVID-19 Vaccine (8 - Moderna risk 2024-25 season) 12/23/2023*   Flu Shot  12/29/2023*   Zoster (Shingles) Vaccine (2 of 2) 12/29/2023*   Breast Cancer Screening  05/27/2025   DTaP/Tdap/Td vaccine (2 - Td or Tdap) 08/30/2032   Pneumococcal Vaccine for age over 41  Completed   HPV Vaccine  Aged Out   Meningitis B Vaccine  Aged Out   Colon Cancer Screening  Discontinued  *Topic was postponed. The date shown is not the original due date.

## 2023-12-07 NOTE — Progress Notes (Addendum)
 Anesthesia Chart Review:  83 year old female followed to cardiology for history of A-fib on Eliquis , pulm hypertension, HLD, OSA not on CPAP.  Echo 06/2023 showed LVEF 50 to 55%,Akiniesis of the RV free wall with tethering of the apex consistent  with McConnell's sign and acute RV dsyfunction (PE ruled out, cardiology felt due to known pulmonary hypertension, no further intervention recommended), mildly reduced RV systolic function, moderately elevated PASP, moderate tricuspid regurgitation.  Nuclear stress 11/2019 was low risk.  Clearance per telephone encounter by Lamarr Satterfield, NP on 11/26/2023, Chart reviewed as part of pre-operative protocol coverage. Given past medical history and time since last visit, based on ACC/AHA guidelines, Darlene [name] is at acceptable risk for the planned procedure without further cardiovascular testing. Per office protocol, patient can hold Eliquis  for 2 days prior to procedure.  Patient will not need bridging with Lovenox (enoxaparin) around procedure. The patient was advised that if she develops new symptoms prior to surgery to contact our office to arrange for a follow-up visit, and she verbalized understanding.  Patient had prolonged admission in April 2024 with GI bleeding and aspiration pneumonia. Also suffered A-fib with RVR related to hypoxia and pulmonary edema/large-volume aspiration.  She was intubated and treated in the ICU for several days.  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  She was extubated on 4/20 and did have ICU delirium.  Echo showed moderate to severe right ventricular dysfunction.  Transiently required pressors due to sepsis from aspiration pneumonia.  Ultimately she was able to be weaned off pressors and oxygen requirement improved.  She was diuresed aggressively.  She did require thoracentesis for a right pleural effusion.  Ultimately was able to transfer to the floor on 4/30.Given these events, it was felt that repairing her  hiatal hernia prior to discharge would be prudent to avoid future episodes of aspiration pneumonia.  This was done on 5/7.  She had a slow postoperative course with ongoing poor p.o. intake requiring supplemental tube feeds but was ultimately discharged to SNF on 5/15.  Subsequent admission 6/17 through 09/03/2023 for sepsis secondary to CAP/possible aspiration pneumonia.  Treated with antibiotics with resolution of symptoms.   Patient reports low-dose Eliquis  2125.  Preop labs reviewed, WNL.  EKG 08/31/23: Atrial fibrillation.  Rate 96.  TTE 06/30/2023: 1. Left ventricular ejection fraction, by estimation, is 50 to 55%. The  left ventricle has low normal function. The left ventricle demonstrates  global hypokinesis. There is mild concentric left ventricular hypertrophy.  Left ventricular diastolic  function could not be evaluated.   2. Akiniesis of the RV free wall with tethering of the apex consistent  with McConnell's sign and acute RV dsyfunction. Right ventricular systolic  function is mildly reduced. The right ventricular size is mildly enlarged.  There is moderately elevated  pulmonary artery systolic pressure.   3. Left atrial size was moderately dilated.   4. A small pericardial effusion is present.   5. The mitral valve is normal in structure. Trivial mitral valve  regurgitation.   6. Tricuspid valve regurgitation is moderate.   7. The aortic valve is tricuspid. Aortic valve regurgitation is trivial.  Aortic valve sclerosis is present, with no evidence of aortic valve  stenosis.   8. The inferior vena cava is dilated in size with <50% respiratory  variability, suggesting right atrial pressure of 15 mmHg.   Nuclear stress 12/08/2019: There was no ST segment deviation noted during stress. The study is normal. There are no perfusion defect  consistentw with prior infarct or current ischemia. This is a low risk study. The left ventricular ejection fraction is normal  (55-65%).    Lynwood Geofm RIGGERS [facility]/Anesthesiology Phone [phone] 12/07/2023 10:08 AM

## 2023-12-08 ENCOUNTER — Encounter: Payer: Self-pay | Admitting: Family Medicine

## 2023-12-16 ENCOUNTER — Ambulatory Visit: Admitting: Internal Medicine

## 2023-12-16 ENCOUNTER — Encounter: Payer: Self-pay | Admitting: Internal Medicine

## 2023-12-16 ENCOUNTER — Other Ambulatory Visit: Payer: Self-pay

## 2023-12-16 VITALS — BP 127/85 | HR 84 | Temp 97.8°F | Ht 70.0 in | Wt 213.0 lb

## 2023-12-16 DIAGNOSIS — Z22322 Carrier or suspected carrier of Methicillin resistant Staphylococcus aureus: Secondary | ICD-10-CM

## 2023-12-16 DIAGNOSIS — K469 Unspecified abdominal hernia without obstruction or gangrene: Secondary | ICD-10-CM | POA: Diagnosis not present

## 2023-12-16 NOTE — Patient Instructions (Signed)
 From id clinic standpoint, nothing else to do differently from surgical team plan  No need to follow up with us  unless you have recurrent skin abscesses

## 2023-12-16 NOTE — Progress Notes (Signed)
 [facility]  Reason for Consult: mrsa carrier Referring Provider: Vanderbilt Ned    Patient Active Problem List   Diagnosis Date Noted   Jaw dyskinesia 12/07/2023   Jaw pain 12/07/2023   S/P repair of paraesophageal hernia 09/29/2023   Malnutrition of moderate degree 09/03/2023   Atrial fibrillation (HCC) 08/31/2023   Obstructive sleep apnea 08/31/2023   SOB (shortness of breath) 08/31/2023   Anxiety 08/17/2023   Hiatal hernia 07/14/2023   Acute pulmonary edema (HCC) 07/14/2023   Aspiration into airway 07/06/2023   Acute respiratory failure with hypoxia (HCC) 06/30/2023   Acute GI bleeding 06/29/2023   Encounter to establish care with new provider 06/15/2023   Class 1 obesity with serious comorbidity and body mass index (BMI) of 32.0 to 32.9 in adult 06/15/2023   Physical deconditioning 01/15/2020   At moderate risk for fall 01/15/2020   Pulmonary hypertension (HCC) 01/12/2020   Central sleep apnea associated with atrial fibrillation (HCC) 06/26/2019   Severe obstructive sleep apnea-hypopnea syndrome 05/31/2019   Hypoxemia requiring supplemental oxygen 05/09/2019   LVH (left ventricular hypertrophy) due to hypertensive disease, without heart failure 05/09/2019   Atrial fibrillation with RVR (HCC) 04/15/2019   Esophageal stricture 09/04/2018   Iron deficiency anemia 09/04/2018   Vitamin D  deficiency 02/16/2018   Hyperlipidemia, mixed 10/01/2017   Morbid obesity (HCC) - BMI 30+ with OSA 09/29/2017   History of colon polyps 09/29/2017   GERD (gastroesophageal reflux disease) 04/01/2017   DJD (degenerative joint disease)    Chronic lower back pain    Depression       HPI: Darlene [name] is a 83 y.o. female with planned surgery for spigalean hernia referred by surgery here for mrsa pcr nares positive  She doesn't have any hx of recurrent skin boils/abscess  She is already on a mrsa decontamination procedure prior to this planned  surgery 10/21. She has instruction and prescription to do this   She has a feeding tube that was removed end of 09/2023 and the wound had healed. There is a dimple there but nothing draining; she does put nystatin powder on it  No other concern today  Review of Systems: ROS All other ros negative      Past Medical History:  Diagnosis Date   Anemia    Anxiety    Chronic lower back pain    Depression    DJD (degenerative joint disease)    knees, neck   Dyspnea    Dysrhythmia    A. Fib - on Eliquis    GERD (gastroesophageal reflux disease)    has had dilitation in the past   History of hiatal hernia    Hypertension    Pneumonia 08/2023   after aspiration in surgery- pt was in ICU   Pulmonary hypertension (HCC)    Sleep apnea     Social History   Tobacco Use   Smoking status: Never   Smokeless tobacco: Never  Vaping Use   Vaping status: Never Used  Substance Use Topics   Alcohol use: Not Currently    Comment: 3 drinks per month   Drug use: No    Family History  Problem Relation Age of Onset   Hypertension Mother    Heart disease Mother    Hypertension Father    Heart disease Father    Tremor Sister    Dementia Brother    Hypertension Daughter    Colon cancer Neg Hx  Esophageal cancer Neg Hx    Liver disease Neg Hx     Allergies  Allergen Reactions   Cephalosporins Rash    Tolerated ceftriaxone  06/2023   Keflex [Cephalexin] Rash    OBJECTIVE: Vitals:   12/16/23 0852  BP: 127/85  Pulse: 84  Temp: 97.8 F (36.6 C)  TempSrc: Temporal  SpO2: 100%  Weight: 213 lb (96.6 kg)  Height: 5' 10 (1.778 m)   Body mass index is 30.56 kg/m.   Physical Exam General/constitutional: no distress, pleasant HEENT: Normocephalic, PER, Conj Clear, EOMI, Oropharynx clear Neck supple CV: rrr no mrg Lungs: clear to auscultation, normal respiratory effort Abd: Soft, Nontender Ext: no edema Skin: No Rash -- left upper quadrant small dimple in skin from  feeding tube site, no discharge/flutctuance/erythema; nystatin powder on it; slight hyperpigmentation Neuro: nonfocal MSK: no peripheral joint swelling/tenderness/warmth; back spines nontender   Lab: Lab Results  Component Value Date   WBC 5.0 12/06/2023   HGB 12.8 12/06/2023   HCT 39.3 12/06/2023   MCV 98.7 12/06/2023   PLT 169 12/06/2023   Last metabolic panel Lab Results  Component Value Date   GLUCOSE 101 (H) 12/06/2023   NA 139 12/06/2023   K 3.7 12/06/2023   CL 104 12/06/2023   CO2 27 12/06/2023   BUN 12 12/06/2023   CREATININE 0.81 12/06/2023   GFRNONAA >60 12/06/2023   CALCIUM 9.2 12/06/2023   PHOS 3.0 07/15/2023   PROT 5.7 (L) 09/13/2023   ALBUMIN  2.4 (L) 09/01/2023   BILITOT 0.4 09/13/2023   ALKPHOS 46 09/01/2023   AST 20 09/13/2023   ALT 12 09/13/2023   ANIONGAP 8 12/06/2023    Microbiology:  Serology:  Imaging:   Assessment/plan: Problem List Items Addressed This Visit   None Visit Diagnoses       MRSA carrier    -  Primary     Abdominal hernia without obstruction and without gangrene, recurrence not specified, unspecified hernia type             We discussed pathogenesis/complication of mrsa carrier We discussed each hospital infection prevention protocol for mrsa decolonication prior to surgery We discussed for patients with recurrent boils/skin abscess then the role for decolonization outpatient   At this time she is appropriately managed. She is ordered and advised on decolonization already prior to surgery  Advise her if recurrent skin boils could return to clinic as needed  Otherwise no need for id clinic f/u   Chart forwarded to referring provider  I have spent a total of 65 minutes of face-to-face and non-face-to-face time, excluding clinical staff time, preparing to see patient, ordering tests and/or medications, and provide counseling the patient       Follow-up: Return if symptoms worsen or fail to improve.  Constance ONEIDA Passer, MD [facility] 12/16/2023, 9:21 AM

## 2023-12-21 ENCOUNTER — Other Ambulatory Visit

## 2023-12-28 ENCOUNTER — Ambulatory Visit: Admitting: Family Medicine

## 2024-01-03 ENCOUNTER — Other Ambulatory Visit: Payer: Self-pay

## 2024-01-03 ENCOUNTER — Encounter (HOSPITAL_COMMUNITY): Payer: Self-pay | Admitting: Surgery

## 2024-01-03 NOTE — Progress Notes (Signed)
 Patient updated on surgery date and time of arrival.  Last dose of Eliquis  was 10/18.  Patient states she was postponed due to positive MRSA screening and had to follow up with ID.  Patient reports that she completed 10 days of Mupirocin  in the nares.  Patient reports no changes since she was seen in PAT.

## 2024-01-04 ENCOUNTER — Observation Stay (HOSPITAL_COMMUNITY): Admission: RE | Admit: 2024-01-04 | Discharge: 2024-01-05 | Disposition: A | Attending: Surgery | Admitting: Surgery

## 2024-01-04 ENCOUNTER — Encounter (HOSPITAL_COMMUNITY)
Admission: RE | Disposition: A | Discharge status: Home / Self Care | Payer: Self-pay | Source: Home / Self Care | Attending: Surgery

## 2024-01-04 ENCOUNTER — Ambulatory Visit (HOSPITAL_COMMUNITY): Payer: Self-pay | Admitting: Physician Assistant

## 2024-01-04 ENCOUNTER — Encounter (HOSPITAL_COMMUNITY): Payer: Self-pay | Admitting: Surgery

## 2024-01-04 ENCOUNTER — Ambulatory Visit (HOSPITAL_COMMUNITY)

## 2024-01-04 DIAGNOSIS — Z79899 Other long term (current) drug therapy: Secondary | ICD-10-CM | POA: Diagnosis not present

## 2024-01-04 DIAGNOSIS — I10 Essential (primary) hypertension: Secondary | ICD-10-CM | POA: Insufficient documentation

## 2024-01-04 DIAGNOSIS — I4891 Unspecified atrial fibrillation: Secondary | ICD-10-CM | POA: Diagnosis not present

## 2024-01-04 DIAGNOSIS — K4090 Unilateral inguinal hernia, without obstruction or gangrene, not specified as recurrent: Principal | ICD-10-CM | POA: Insufficient documentation

## 2024-01-04 DIAGNOSIS — R1031 Right lower quadrant pain: Secondary | ICD-10-CM | POA: Diagnosis present

## 2024-01-04 DIAGNOSIS — F418 Other specified anxiety disorders: Secondary | ICD-10-CM | POA: Diagnosis not present

## 2024-01-04 HISTORY — PX: VENTRAL HERNIA REPAIR: SHX424

## 2024-01-04 HISTORY — PX: LAPAROSCOPY: SHX197

## 2024-01-04 SURGERY — LAPAROSCOPY, DIAGNOSTIC
Anesthesia: General | Site: Abdomen

## 2024-01-04 MED ORDER — FENTANYL CITRATE (PF) 100 MCG/2ML IJ SOLN
25.0000 ug | INTRAMUSCULAR | Status: DC | PRN
  Administered 2024-01-04: 25 ug via INTRAVENOUS

## 2024-01-04 MED ORDER — ZOLPIDEM TARTRATE 5 MG PO TABS
5.0000 mg | ORAL_TABLET | Freq: Every evening | ORAL | Status: DC | PRN
  Administered 2024-01-04: 5 mg via ORAL
  Filled 2024-01-04: qty 1

## 2024-01-04 MED ORDER — METOPROLOL SUCCINATE ER 50 MG PO TB24
50.0000 mg | ORAL_TABLET | Freq: Every day | ORAL | Status: DC
Start: 2024-01-05 — End: 2024-01-05
  Filled 2024-01-04: qty 1

## 2024-01-04 MED ORDER — PROPOFOL 10 MG/ML IV BOLUS
INTRAVENOUS | Status: DC | PRN
Start: 2024-01-04 — End: 2024-01-04
  Administered 2024-01-04: 100 mg via INTRAVENOUS

## 2024-01-04 MED ORDER — ONDANSETRON HCL 4 MG/2ML IJ SOLN
INTRAMUSCULAR | Status: DC | PRN
  Administered 2024-01-04: 4 mg via INTRAVENOUS

## 2024-01-04 MED ORDER — VISTASEAL 10 ML SINGLE DOSE KIT
10.0000 mL | PACK | Freq: Once | CUTANEOUS | Status: AC
Start: 2024-01-04 — End: 2024-01-04
  Administered 2024-01-04: 10 mL via TOPICAL
  Filled 2024-01-04: qty 10

## 2024-01-04 MED ORDER — PHENYLEPHRINE 80 MCG/ML (10ML) SYRINGE FOR IV PUSH (FOR BLOOD PRESSURE SUPPORT)
PREFILLED_SYRINGE | INTRAVENOUS | Status: AC
Start: 2024-01-04 — End: 2024-01-04
  Filled 2024-01-04: qty 10

## 2024-01-04 MED ORDER — BUPIVACAINE-EPINEPHRINE 0.25% -1:200000 IJ SOLN
INTRAMUSCULAR | Status: DC | PRN
  Administered 2024-01-04: 20 mL

## 2024-01-04 MED ORDER — PROPOFOL 10 MG/ML IV BOLUS
INTRAVENOUS | Status: AC
  Filled 2024-01-04: qty 20

## 2024-01-04 MED ORDER — LIDOCAINE 2% (20 MG/ML) 5 ML SYRINGE
INTRAMUSCULAR | Status: DC | PRN
Start: 2024-01-04 — End: 2024-01-04
  Administered 2024-01-04: 100 mg via INTRAVENOUS

## 2024-01-04 MED ORDER — SUGAMMADEX SODIUM 200 MG/2ML IV SOLN
INTRAVENOUS | Status: DC | PRN
  Administered 2024-01-04: 200 mg via INTRAVENOUS

## 2024-01-04 MED ORDER — SIMETHICONE 80 MG PO CHEW: 40.0000 mg | CHEWABLE_TABLET | Freq: Four times a day (QID) | ORAL | Status: DC | PRN

## 2024-01-04 MED ORDER — METOPROLOL TARTRATE 5 MG/5ML IV SOLN: 5.0000 mg | Freq: Four times a day (QID) | INTRAVENOUS | Status: DC | PRN

## 2024-01-04 MED ORDER — FENTANYL CITRATE (PF) 250 MCG/5ML IJ SOLN
INTRAMUSCULAR | Status: DC | PRN
  Administered 2024-01-04 (×3): 50 ug via INTRAVENOUS

## 2024-01-04 MED ORDER — ACETAMINOPHEN 325 MG PO TABS: 650.0000 mg | ORAL_TABLET | Freq: Four times a day (QID) | ORAL | Status: DC | PRN

## 2024-01-04 MED ORDER — ACETAMINOPHEN 10 MG/ML IV SOLN: 1000.0000 mg | Freq: Once | INTRAVENOUS | Status: DC | PRN

## 2024-01-04 MED ORDER — LIDOCAINE 2% (20 MG/ML) 5 ML SYRINGE
INTRAMUSCULAR | Status: AC
  Filled 2024-01-04: qty 5

## 2024-01-04 MED ORDER — PHENYLEPHRINE 80 MCG/ML (10ML) SYRINGE FOR IV PUSH (FOR BLOOD PRESSURE SUPPORT)
PREFILLED_SYRINGE | INTRAVENOUS | Status: DC | PRN
  Administered 2024-01-04: 160 ug via INTRAVENOUS
  Administered 2024-01-04: 80 ug via INTRAVENOUS
  Administered 2024-01-04: 160 ug via INTRAVENOUS

## 2024-01-04 MED ORDER — CHLORHEXIDINE GLUCONATE CLOTH 2 % EX PADS: 6.0000 | MEDICATED_PAD | Freq: Once | CUTANEOUS | Status: DC

## 2024-01-04 MED ORDER — CHLORHEXIDINE GLUCONATE CLOTH 2 % EX PADS
6.0000 | MEDICATED_PAD | Freq: Once | CUTANEOUS | Status: AC
  Administered 2024-01-04: 6 via TOPICAL

## 2024-01-04 MED ORDER — ONDANSETRON HCL 4 MG/2ML IJ SOLN
INTRAMUSCULAR | Status: AC
Start: 2024-01-04 — End: 2024-01-04
  Filled 2024-01-04: qty 2

## 2024-01-04 MED ORDER — CLINDAMYCIN PHOSPHATE 900 MG/50ML IV SOLN
900.0000 mg | INTRAVENOUS | Status: AC
  Administered 2024-01-04: 900 mg via INTRAVENOUS
  Filled 2024-01-04: qty 50

## 2024-01-04 MED ORDER — FUROSEMIDE 20 MG PO TABS
20.0000 mg | ORAL_TABLET | Freq: Every day | ORAL | Status: DC
  Administered 2024-01-04 – 2024-01-05 (×2): 20 mg via ORAL
  Filled 2024-01-04 (×2): qty 1

## 2024-01-04 MED ORDER — CHLORHEXIDINE GLUCONATE 0.12 % MT SOLN
15.0000 mL | Freq: Once | OROMUCOSAL | Status: AC
  Administered 2024-01-04: 15 mL via OROMUCOSAL
  Filled 2024-01-04: qty 15

## 2024-01-04 MED ORDER — ONDANSETRON 4 MG PO TBDP: 4.0000 mg | ORAL_TABLET | Freq: Four times a day (QID) | ORAL | Status: DC | PRN

## 2024-01-04 MED ORDER — ESCITALOPRAM OXALATE 10 MG PO TABS
5.0000 mg | ORAL_TABLET | Freq: Every day | ORAL | Status: DC
  Administered 2024-01-05: 5 mg via ORAL
  Filled 2024-01-04: qty 1

## 2024-01-04 MED ORDER — LINACLOTIDE 72 MCG PO CAPS
72.0000 ug | ORAL_CAPSULE | Freq: Every day | ORAL | Status: DC
  Administered 2024-01-05: 72 ug via ORAL
  Filled 2024-01-04: qty 1

## 2024-01-04 MED ORDER — ACETAMINOPHEN 650 MG RE SUPP: 650.0000 mg | Freq: Four times a day (QID) | RECTAL | Status: DC | PRN

## 2024-01-04 MED ORDER — FENTANYL CITRATE (PF) 100 MCG/2ML IJ SOLN
INTRAMUSCULAR | Status: AC
  Filled 2024-01-04: qty 2

## 2024-01-04 MED ORDER — AMIODARONE HCL 200 MG PO TABS
200.0000 mg | ORAL_TABLET | Freq: Every day | ORAL | Status: DC
  Administered 2024-01-05: 200 mg via ORAL
  Filled 2024-01-04: qty 1

## 2024-01-04 MED ORDER — VANCOMYCIN HCL IN DEXTROSE 1-5 GM/200ML-% IV SOLN
1000.0000 mg | Freq: Once | INTRAVENOUS | Status: AC
  Administered 2024-01-04: 1000 mg via INTRAVENOUS
  Filled 2024-01-04: qty 200

## 2024-01-04 MED ORDER — BUPIVACAINE-EPINEPHRINE (PF) 0.25% -1:200000 IJ SOLN
INTRAMUSCULAR | Status: AC
  Filled 2024-01-04: qty 30

## 2024-01-04 MED ORDER — ONDANSETRON HCL 4 MG/2ML IJ SOLN: 4.0000 mg | Freq: Once | INTRAMUSCULAR | Status: DC | PRN

## 2024-01-04 MED ORDER — LORATADINE 10 MG PO TABS
10.0000 mg | ORAL_TABLET | Freq: Every morning | ORAL | Status: DC
  Administered 2024-01-05: 10 mg via ORAL
  Filled 2024-01-04: qty 1

## 2024-01-04 MED ORDER — ACETAMINOPHEN 500 MG PO TABS: 500.0000 mg | ORAL_TABLET | Freq: Four times a day (QID) | ORAL | Status: DC | PRN

## 2024-01-04 MED ORDER — SODIUM CHLORIDE 0.9 % IR SOLN
Status: DC | PRN
  Administered 2024-01-04: 1000 mL

## 2024-01-04 MED ORDER — 0.9 % SODIUM CHLORIDE (POUR BTL) OPTIME
TOPICAL | Status: DC | PRN
  Administered 2024-01-04: 1000 mL

## 2024-01-04 MED ORDER — DEXTROSE-SODIUM CHLORIDE 5-0.9 % IV SOLN: INTRAVENOUS | Status: DC

## 2024-01-04 MED ORDER — ENOXAPARIN SODIUM 40 MG/0.4ML IJ SOSY
40.0000 mg | PREFILLED_SYRINGE | INTRAMUSCULAR | Status: DC
  Administered 2024-01-05: 40 mg via SUBCUTANEOUS
  Filled 2024-01-04: qty 0.4

## 2024-01-04 MED ORDER — ORAL CARE MOUTH RINSE: 15.0000 mL | Freq: Once | OROMUCOSAL | Status: AC

## 2024-01-04 MED ORDER — DEXAMETHASONE SOD PHOSPHATE PF 10 MG/ML IJ SOLN
INTRAMUSCULAR | Status: DC | PRN
Start: 2024-01-04 — End: 2024-01-04
  Administered 2024-01-04: 5 mg via INTRAVENOUS

## 2024-01-04 MED ORDER — BUSPIRONE HCL 5 MG PO TABS
5.0000 mg | ORAL_TABLET | Freq: Three times a day (TID) | ORAL | Status: DC
  Administered 2024-01-04 – 2024-01-05 (×3): 5 mg via ORAL
  Filled 2024-01-04 (×3): qty 1

## 2024-01-04 MED ORDER — ROCURONIUM BROMIDE 10 MG/ML (PF) SYRINGE
PREFILLED_SYRINGE | INTRAVENOUS | Status: DC | PRN
  Administered 2024-01-04: 60 mg via INTRAVENOUS
  Administered 2024-01-04: 20 mg via INTRAVENOUS

## 2024-01-04 MED ORDER — ONDANSETRON HCL 4 MG/2ML IJ SOLN: 4.0000 mg | Freq: Four times a day (QID) | INTRAMUSCULAR | Status: DC | PRN

## 2024-01-04 MED ORDER — OXYCODONE HCL 5 MG PO TABS
5.0000 mg | ORAL_TABLET | ORAL | Status: DC | PRN
  Administered 2024-01-04 – 2024-01-05 (×2): 5 mg via ORAL
  Filled 2024-01-04 (×2): qty 1

## 2024-01-04 MED ORDER — INFLUENZA VAC SPLIT HIGH-DOSE 0.5 ML IM SUSY
0.5000 mL | PREFILLED_SYRINGE | INTRAMUSCULAR | Status: DC
  Filled 2024-01-04: qty 0.5

## 2024-01-04 MED ORDER — LACTATED RINGERS IV SOLN: INTRAVENOUS | Status: DC

## 2024-01-04 MED ORDER — PHENYLEPHRINE HCL-NACL 20-0.9 MG/250ML-% IV SOLN
INTRAVENOUS | Status: DC | PRN
  Administered 2024-01-04: 25 ug/min via INTRAVENOUS

## 2024-01-04 MED ORDER — METHOCARBAMOL 500 MG PO TABS
500.0000 mg | ORAL_TABLET | Freq: Four times a day (QID) | ORAL | Status: DC | PRN
Start: 2024-01-04 — End: 2024-01-05
  Administered 2024-01-05: 500 mg via ORAL
  Filled 2024-01-04: qty 1

## 2024-01-04 MED ORDER — ACETAMINOPHEN 500 MG PO TABS
1000.0000 mg | ORAL_TABLET | ORAL | Status: AC
  Administered 2024-01-04: 1000 mg via ORAL
  Filled 2024-01-04: qty 2

## 2024-01-04 MED ORDER — ALBUTEROL SULFATE (2.5 MG/3ML) 0.083% IN NEBU: 2.5000 mg | INHALATION_SOLUTION | Freq: Four times a day (QID) | RESPIRATORY_TRACT | Status: DC | PRN

## 2024-01-04 MED ORDER — OXYCODONE HCL 5 MG/5ML PO SOLN: 5.0000 mg | Freq: Once | ORAL | Status: DC | PRN

## 2024-01-04 MED ORDER — ROCURONIUM BROMIDE 10 MG/ML (PF) SYRINGE
PREFILLED_SYRINGE | INTRAVENOUS | Status: AC
  Filled 2024-01-04: qty 10

## 2024-01-04 MED ORDER — OXYCODONE HCL 5 MG PO TABS: 5.0000 mg | ORAL_TABLET | Freq: Once | ORAL | Status: DC | PRN

## 2024-01-04 MED ORDER — EPHEDRINE 5 MG/ML INJ
INTRAVENOUS | Status: AC
  Filled 2024-01-04: qty 5

## 2024-01-04 MED ORDER — TRAMADOL HCL 50 MG PO TABS: 50.0000 mg | ORAL_TABLET | Freq: Four times a day (QID) | ORAL | Status: DC | PRN

## 2024-01-04 MED ORDER — FENTANYL CITRATE (PF) 50 MCG/ML IJ SOSY: 50.0000 ug | PREFILLED_SYRINGE | INTRAMUSCULAR | Status: DC | PRN

## 2024-01-04 MED ORDER — PANTOPRAZOLE SODIUM 40 MG PO TBEC
40.0000 mg | DELAYED_RELEASE_TABLET | Freq: Two times a day (BID) | ORAL | Status: DC
  Administered 2024-01-04 – 2024-01-05 (×2): 40 mg via ORAL
  Filled 2024-01-04 (×2): qty 1

## 2024-01-04 SURGICAL SUPPLY — 39 items
APPLICATOR VISTASEAL 35 (MISCELLANEOUS) IMPLANT
BAG COUNTER SPONGE SURGICOUNT (BAG) ×1 IMPLANT
BINDER ABDOMINAL 12 ML 46-62 (SOFTGOODS) ×1 IMPLANT
CANISTER SUCTION 3000ML PPV (SUCTIONS) IMPLANT
CHLORAPREP W/TINT 26 (MISCELLANEOUS) ×1 IMPLANT
CLIP APPLIE ROT 10 11.4 M/L (STAPLE) IMPLANT
COVER SURGICAL LIGHT HANDLE (MISCELLANEOUS) ×1 IMPLANT
DERMABOND ADVANCED .7 DNX12 (GAUZE/BANDAGES/DRESSINGS) ×1 IMPLANT
DEVICE SECURE STRAP 25 ABSORB (INSTRUMENTS) IMPLANT
DEVICE TROCAR PUNCTURE CLOSURE (ENDOMECHANICALS) ×1 IMPLANT
ELECTRODE REM PT RTRN 9FT ADLT (ELECTROSURGICAL) ×1 IMPLANT
GLOVE BIO SURGEON STRL SZ8 (GLOVE) ×1 IMPLANT
GLOVE BIOGEL PI IND STRL 8 (GLOVE) ×1 IMPLANT
GOWN STRL REUS W/ TWL LRG LVL3 (GOWN DISPOSABLE) ×2 IMPLANT
GOWN STRL REUS W/ TWL XL LVL3 (GOWN DISPOSABLE) ×1 IMPLANT
GRASPER SUT TROCAR 14GX15 (MISCELLANEOUS) ×1 IMPLANT
IRRIGATION SUCT STRKRFLW 2 WTP (MISCELLANEOUS) IMPLANT
KIT BASIN OR (CUSTOM PROCEDURE TRAY) ×1 IMPLANT
KIT TURNOVER KIT B (KITS) ×2 IMPLANT
MESH OVITEX PERM 10X17 3L (Mesh General) IMPLANT
NDL SPNL 22GX3.5 QUINCKE BK (NEEDLE) ×1 IMPLANT
NEEDLE SPNL 22GX3.5 QUINCKE BK (NEEDLE) ×1 IMPLANT
PAD ARMBOARD POSITIONER FOAM (MISCELLANEOUS) ×2 IMPLANT
PENCIL SMOKE EVACUATOR (MISCELLANEOUS) ×1 IMPLANT
SCISSORS LAP 5X35 DISP (ENDOMECHANICALS) IMPLANT
SET TUBE SMOKE EVAC HIGH FLOW (TUBING) ×1 IMPLANT
SLEEVE Z-THREAD 5X100MM (TROCAR) ×2 IMPLANT
SOLN 0.9% NACL POUR BTL 1000ML (IV SOLUTION) ×1 IMPLANT
SOLN STERILE WATER BTL 1000 ML (IV SOLUTION) ×1 IMPLANT
SPIKE FLUID TRANSFER (MISCELLANEOUS) ×1 IMPLANT
SUT MNCRL AB 4-0 PS2 18 (SUTURE) ×1 IMPLANT
SUT VIC AB 3-0 SH 18 (SUTURE) IMPLANT
SUT VICRYL 0 UR6 27IN ABS (SUTURE) IMPLANT
TOWEL GREEN STERILE (TOWEL DISPOSABLE) ×1 IMPLANT
TOWEL GREEN STERILE FF (TOWEL DISPOSABLE) ×1 IMPLANT
TRAY LAPAROSCOPIC MC (CUSTOM PROCEDURE TRAY) ×1 IMPLANT
TROCAR BALLN 12MMX100 BLUNT (TROCAR) IMPLANT
TROCAR Z-THREAD OPTICAL 5X100M (TROCAR) ×1 IMPLANT
WARMER LAPAROSCOPE (MISCELLANEOUS) ×1 IMPLANT

## 2024-01-04 NOTE — Anesthesia Procedure Notes (Signed)
 Procedure Name: Intubation Date/Time: 01/04/2024 7:45 AM  Performed by: Keneth Lynwood POUR, MDPre-anesthesia Checklist: Patient identified, Emergency Drugs available, Suction available and Patient being monitored Patient Re-evaluated:Patient Re-evaluated prior to induction Oxygen Delivery Method: Circle System Utilized Preoxygenation: Pre-oxygenation with 100% oxygen Induction Type: IV induction Ventilation: Mask ventilation without difficulty Laryngoscope Size: Miller and 2 Grade View: Grade III Tube type: Oral Tube size: 7.0 mm Number of attempts: 1 Airway Equipment and Method: Stylet and Bite block Placement Confirmation: ETT inserted through vocal cords under direct vision, positive ETCO2 and breath sounds checked- equal and bilateral Tube secured with: Tape Dental Injury: Teeth and Oropharynx as per pre-operative assessment  Difficulty Due To: Difficult Airway- due to reduced neck mobility

## 2024-01-04 NOTE — Discharge Instructions (Signed)
 [facility]__[facility]  UMBILICAL OR INGUINAL HERNIA REPAIR: POST OP INSTRUCTIONS  Always review your discharge instruction sheet given to you by the facility where your surgery was performed. IF YOU HAVE DISABILITY OR FAMILY LEAVE FORMS, YOU MUST BRING THEM TO THE OFFICE FOR PROCESSING.   DO NOT GIVE THEM TO YOUR DOCTOR.  1. A  prescription for pain medication may be given to you upon discharge.  Take your pain medication as prescribed, if needed.  If narcotic pain medicine is not needed, then you may take acetaminophen (Tylenol) or ibuprofen (Advil) as needed. 2. Take your usually prescribed medications unless otherwise directed. If you need a refill on your pain medication, please contact your pharmacy.  They will contact our office to request authorization. Prescriptions will not be filled after 5 pm or on week-ends. 3. You should follow a light diet the first 24 hours after arrival home, such as soup and crackers, etc.  Be sure to include lots of fluids daily.  Resume your normal diet the day after surgery. 4.Most patients will experience some swelling and bruising around the umbilicus or in the groin and scrotum.  Ice packs and reclining will help.  Swelling and bruising can take several days to resolve.  6. It is common to experience some constipation if taking pain medication after surgery.  Increasing fluid intake and taking a stool softener (such as Colace) will usually help or prevent this problem from occurring.  A mild laxative (Milk of Magnesia or Miralax) should be taken according to package directions if there are no bowel movements after 48 hours. 7. Unless discharge instructions indicate otherwise, you may remove your bandages 24-48 hours after surgery, and you may shower at that time.  You may have steri-strips (small skin tapes) in place directly over the incision.  These strips should be left on the skin for 7-10 days.  If your surgeon used skin glue on the  incision, you may shower in 24 hours.  The glue will flake off over the next 2-3 weeks.  Any sutures or staples will be removed at the office during your follow-up visit. 8. ACTIVITIES:  You may resume regular (light) daily activities beginning the next day--such as daily self-care, walking, climbing stairs--gradually increasing activities as tolerated.  You may have sexual intercourse when it is comfortable.  Refrain from any heavy lifting or straining until approved by your doctor.  a.You may drive when you are no longer taking prescription pain medication, you can comfortably wear a seatbelt, and you can safely maneuver your car and apply brakes. b.RETURN TO WORK:   _____________________________________________  9.You should see your doctor in the office for a follow-up appointment approximately 2-3 weeks after your surgery.  Make sure that you call for this appointment within a day or two after you arrive home to insure a convenient appointment time. 10.OTHER INSTRUCTIONS: _________________________    _____________________________________  WHEN TO CALL YOUR DOCTOR: Fever over 101.0 Inability to urinate Nausea and/or vomiting Extreme swelling or bruising Continued bleeding from incision. Increased pain, redness, or drainage from the incision  The clinic staff is available to answer your questions during regular business hours.  Please don't hesitate to call and ask to speak to one of the nurses for clinical concerns.  If you have a medical emergency, go to the nearest emergency room or call 911.  A surgeon from [facility] is always on call at the hospital   [address],  [place], [place]  [address] ?  [address] [address], [place], [place]   [address] [phone] ? [phone] ? FAX [phone] Web site: [email or web address]

## 2024-01-04 NOTE — Op Note (Addendum)
 Preoperative diagnosis: Right lower quadrant abdominal pain with history of possible spigelian hernia or right inguinal hernia noted on previous laparoscopy during repair of paraesophageal hernia and G-tube placement  Postoperative diagnosis: Right inguinal hernia indirect without evidence of spigelian hernia  Procedure: Laparoscopic transabdominal repair of right inguinal hernia with ovitex 10 cm x 17 cm inguinal hernia mesh  Surgeon: Debby Shipper, MD   Assistant: Keven Piety, RNFA  Anesthesia: General With 0.25% Marcaine  with epinephrine   EBL: Minimal  Specimen: None  Findings: Indirect right inguinal hernia without incarceration.  No evidence of spigelian hernia.  Indications for procedure: The patient is an 83 year old female who underwent a laparoscopic paraesophageal hernia repair this past summer by Dr. Cameron.  She was noted to have a right lower quadrant hernia but since that was not her pressing issue at the time.  This was not addressed.  She has recovered from her repair of her paraesophageal hernia and G-tube.  The G-tube is out.  She has been doing well.  She still having right lower quadrant pain was felt to be from this hernia which was either spigelian or inguinal.  CT scanning reviewed and I could not see either 1.  I discussed laparoscopy though since she was a ongoing pain with this noted history of hernia.  She presents today for laparoscopy with possible hernia repair depending on findings.The risk of hernia repair include bleeding,  Infection,   Recurrence of the hernia,  Mesh use, chronic pain,  Organ injury,  Bowel injury,  Bladder injury,   nerve injury with numbness around the incision,  Death,  and worsening of preexisting  medical problems.  The alternatives to surgery have been discussed as well..  Long term expectations of both operative and non operative treatments have been discussed.   The patient agrees to proceed.      Description of procedure: The  patient was met in the holding area questions were answered.  She was taken back to the operative room placed upon the OR table.  After induction of general anesthesia, the abdomen is prepped and draped in a sterile fashion and timeout performed.  She received appropriate preoperative antibiotics.  A 1 cm incision was made just below the umbilicus.  Dissection was carried down to the fascia was identified and opened in the midline.  We entered the abdominal cavity without difficulty under direct vision.  A pursestring suture of 0 Vicryl was placed.  A balloontipped 12 mm Hassan port was placed.  Laparoscopy was performed after insufflation of the abdominal cavity with 15 mmHg pressure CO2.  She was placed in Trendelenburg.  Four-quadrant laparoscopy revealed the previous gastrostomy tube site.  The liver appeared normal as well as the stomach.  The small bowel and colon were grossly normal upon inspection.  There were dense adhesions to the right lower quadrant.  I placed 2 additional 5 mm ports 1 in the right upper quadrant and the second left lower quadrant.  Sharp dissection with cautery and scissors was used to take down the right lower quadrant lesions near what appeared to be the cecum.  When she was in Trendelenburg these tracked all the way down into the right inguinal region.  This was dissected out further and I actually opened the preperitoneal space in the right inguinal canal region.  This facilitated visualization and dissection.  She was noted after careful dissection to have what appears to be a right inguinal hernia which was indirect.  The hernia sac was then  dissected out from this carefully.  Care was taken not to injure the femoral vessels which were identified inferior to this.  We tediously dissected a sac that was quite fibrosed and densely adherent in the inguinal canal.  We finally was able to dissect this with sharp dissection and cautery to get the sac off what appeared to be the round  ligament.  This was stripped away back in the indirect defect was circumferentially identified.  This was cleared of any tissue or any lead points to prevent recurrence.  We were then dissecting just anterior to the bladder.  The pubic symphysis was dissected as well on the right.  A 10 cm x 17 cm ovitex mesh was brought to the field.  It was soaked in saline.  Was placed in the peripheral space and deployed.  Care was taken to carefully place this around the cord structures and indirect defect to cover it.  There was then secured to the pubis and the anterior abdominal wall with absorbable tacker.  We then used Vistaseal on the inferior border of the mesh to help facilitate fixation inferiorly.  This cover the defect nicely.  Hemostasis was achieved.  I then reapproximated the peritoneum to cover the mesh with a tacker to prevent any gaps.  Of note there was a small fascial defect noted along the posterior aspect of the transversus abdominis muscle.  This was in the right lower quadrant.  This looks like to be secondary to dissection in that area.  A single stitch of 0 Vicryl was placed with a suture passer under direct vision to close this defect.  This was just anterior to the mesh.  There is no evidence of a subacute hernia though upon examination.  The remainder of the laparoscopic semination was normal with no evidence of bowel injury or injury from the procedure or bladder injury noted.  After examining for hemostasis, the ports were removed and CO2 was allowed to escape.  Umbilical fascia closed with 0 Vicryl pursestring already in place.  4-0 Monocryl used to close skin.  All counts were found to be correct.  The patient was awoke extubated taken to recovery in satisfactory condition.

## 2024-01-04 NOTE — Anesthesia Postprocedure Evaluation (Signed)
 Anesthesia Post Note  Patient: ADDYLYNN BALIN  Procedure(s) Performed: LAPAROSCOPY, DIAGNOSTIC W/LYSIS OF ADHESIONS (Abdomen) REPAIR, HERNIA, VENTRAL, LAPAROSCOPIC W/MESH (Abdomen)     Patient location during evaluation: PACU Anesthesia Type: General Level of consciousness: awake and alert Pain management: pain level controlled Vital Signs Assessment: post-procedure vital signs reviewed and stable Respiratory status: spontaneous breathing, nonlabored ventilation, respiratory function stable and patient connected to nasal cannula oxygen Cardiovascular status: blood pressure returned to baseline and stable Postop Assessment: no apparent nausea or vomiting Anesthetic complications: no   No notable events documented.  Last Vitals:  Vitals:   01/04/24 1200 01/04/24 1215  BP: 117/75 110/63  Pulse: 99 93  Resp: 17 18  Temp: (!) 35.9 C (!) 36.1 C  SpO2: 96% 95%    Last Pain:  Vitals:   01/04/24 1215  TempSrc:   PainSc: 0-No pain                 Lynwood MARLA Cornea

## 2024-01-04 NOTE — Interval H&P Note (Signed)
 History and Physical Interval Note:  01/04/2024 7:09 AM  Darlene [name] Alert  has presented today for surgery, with the diagnosis of VENTRAL HERNIA.  The various methods of treatment have been discussed with the patient and family. After consideration of risks, benefits and other options for treatment, the patient has consented to  Procedure(s) with comments: LAPAROSCOPY, DIAGNOSTIC (N/A) REPAIR, HERNIA, VENTRAL, LAPAROSCOPIC (N/A) - POSSIBLE OPEN VENTRAL HERNIA WITH MESH as a surgical intervention.  The patient's history has been reviewed, patient examined, no change in status, stable for surgery.  I have reviewed the patient's chart and labs.  Questions were answered to the patient's satisfaction.   The risk of hernia repair include bleeding,  Infection,   Recurrence of the hernia,  Mesh use, chronic pain,  Organ injury,  Bowel injury,  Bladder injury,   nerve injury with numbness around the incision,  Death,  and worsening of preexisting  medical problems.  The alternatives to surgery have been discussed as well..  Long term expectations of both operative and non operative treatments have been discussed.   The patient agrees to proceed.   [name] A [name]

## 2024-01-04 NOTE — Transfer of Care (Signed)
 Immediate Anesthesia Transfer of Care Note  Patient: Darlene [name]  Procedure(s) Performed: LAPAROSCOPY, DIAGNOSTIC W/LYSIS OF ADHESIONS (Abdomen) REPAIR, HERNIA, VENTRAL, LAPAROSCOPIC W/MESH (Abdomen)  Patient Location: PACU  Anesthesia Type:General  Level of Consciousness: drowsy  Airway & Oxygen Therapy: Patient Spontanous Breathing and Patient connected to face mask oxygen  Post-op Assessment: Report given to RN and Post -op Vital signs reviewed and stable  Post vital signs: Reviewed and stable  Last Vitals:  Vitals Value Taken Time  BP    Temp    Pulse    Resp    SpO2      Last Pain:  Vitals:   01/04/24 0639  TempSrc:   PainSc: 0-No pain         Complications: No notable events documented.

## 2024-01-04 NOTE — H&P (Signed)
 History of Present Illness: Darlene [name] is a 83 y.o. female who is seen today for for evaluation of right lower quadrant hernia. She was noted during repair of a paraesophageal hernia with Dr. Cameron to have a right lower quadrant hernia. After reviewing the op note, it is unclear if this is true spigelian hernia or right inguinal hernia. In any event, this was reduced but not repaired due to the length of the initial procedure. She has developed more discomfort in the area. No nausea or vomiting. She does have occasional nausea from her wrap but otherwise her bowels are working. She does have occasional incontinence she relates today. She does have some soreness over the area in her right lower quadrant. I reviewed role CT scan which appears to be a spigelian hernia but the op note states it might be a right inguinal hernia. She has no signs of bowel obstruction..    Review of Systems: A complete review of systems was obtained from the patient. I have reviewed this information and discussed as appropriate with the patient. See HPI as well for other ROS.  ROS   Medical History: Past Medical History:  Diagnosis Date  Anemia  Anxiety  Arrhythmia  Arthritis  GERD (gastroesophageal reflux disease)  Glaucoma (increased eye pressure)  Hypertension  Sleep apnea   There is no problem list on file for this patient.  Past Surgical History:  Procedure Laterality Date  ROBOTIC SPIGELIAN HR, G TUBE 07/21/2023  Dr Signe  HERNIA REPAIR  HYSTERECTOMY    Allergies  Allergen Reactions  Cephalosporins Rash   Current Outpatient Medications on File Prior to Visit  Medication Sig Dispense Refill  acetaminophen  (TYLENOL ) 500 MG tablet Take 500 mg by mouth every 6 (six) hours as needed for Pain  albuterol  MDI, PROVENTIL , VENTOLIN , PROAIR , HFA 90 mcg/actuation inhaler Inhale 2 Inhalations into the lungs every 6 (six) hours as needed  AMIOdarone  (PACERONE ) 200 MG tablet Take 200 mg by mouth once  daily  ascorbic acid, vitamin C, 500 mg Cap Take 500 mg by mouth  bisacodyL  (DULCOLAX) 10 mg suppository Place 10 mg rectally  busPIRone  (BUSPAR ) 5 MG tablet  cyanocobalamin , vitamin B-12, 2,500 mcg Tab Take 2,500 mcg by mouth  dilTIAZem  (CARDIZEM  CD) 300 MG CD capsule Take 1 capsule by mouth once daily  docusate (COLACE) 50 mg/5 mL liquid Take 100 mg by mouth  ELIQUIS  5 mg tablet Take 1 tablet by mouth 2 (two) times daily  escitalopram  oxalate (LEXAPRO ) 20 MG tablet TAKE 1 TABLET BY MOUTH DAILY FOR MOOD, ANXIETY &amp; IRRITABILITY  FUROsemide  (LASIX ) 20 MG tablet Take 20 mg by mouth once daily  insulin  ASPART (NOVOLOG ) injection (concentration 100 units/mL) Inject 0-20 Units subcutaneously  metoprolol  SUCCinate (TOPROL -XL) 25 MG XL tablet Take 12.5 mg by mouth once daily  metoprolol  TARTrate (LOPRESSOR ) 25 MG tablet TAKE 2 TABLETS BY MOUTH IN THE MORNING AND 1 TABLET BY MOUTH IN THE EVENING  midodrine  (PROAMATINE ) 10 MG tablet Take 10 mg by mouth  pantoprazole  (PROTONIX ) 40 MG DR tablet Take 1 tablet Daily to prevent Indigestion & Acid Reflux  polyethylene glycol (MIRALAX ) packet Take 17 g by mouth once daily  traMADoL  (ULTRAM ) 50 mg tablet Take 50 mg by mouth  metoclopramide  (REGLAN ) 10 MG tablet Take 10 mg by mouth (Patient not taking: Reported on 11/16/2023)   No current facility-administered medications on file prior to visit.   Family History  Problem Relation Age of Onset  Stroke Mother  High blood pressure (  Hypertension) Mother  Diabetes Mother  Coronary Artery Disease (Blocked arteries around heart) Mother  Coronary Artery Disease (Blocked arteries around heart) Father    Social History   Tobacco Use  Smoking Status Never  Smokeless Tobacco Never    Social History   Socioeconomic History  Marital status: Widowed  Tobacco Use  Smoking status: Never  Smokeless tobacco: Never  Substance and Sexual Activity  Alcohol use: Never  Drug use: Never  Sexual activity: Defer    Social Drivers of Health   Financial Resource Strain: Low Risk (02/03/2021)  Received from [facility]  Overall Financial Resource Strain (CARDIA)  Difficulty of Paying Living Expenses: Not very hard  Food Insecurity: No Food Insecurity (08/31/2023)  Received from [facility]  Hunger Vital Sign  Within the past 12 months, you worried that your food would run out before you got the money to buy more.: Never true  Within the past 12 months, the food you bought just didn't last and you didn't have money to get more.: Never true  Transportation Needs: No Transportation Needs (08/31/2023)  Received from [facility] - Transportation  In the past 12 months, has lack of transportation kept you from medical appointments or from getting medications?: No  In the past 12 months, has lack of transportation kept you from meetings, work, or from getting things needed for daily living?: No  Physical Activity: Inactive (04/28/2019)  Received from [facility]  Exercise Vital Sign  On average, how many days per week do you engage in moderate to strenuous exercise (like a brisk walk)?: 0 days  On average, how many minutes do you engage in exercise at this level?: 0 min  Stress: No Stress Concern Present (04/28/2019)  Received from [facility] - Occupational Stress Questionnaire  Feeling of Stress : Only a little  Social Connections: Socially Isolated (08/31/2023)  Received from [facility]  Social Connection and Isolation Panel  In a typical week, how many times do you talk on the phone with family, friends, or neighbors?: Never  How often do you get together with friends or relatives?: Never  How often do you attend church or religious services?: Never  Do you belong to any clubs or organizations such as church groups, unions, fraternal or athletic groups, or school groups?: No  How often do you attend meetings of the clubs or organizations you belong  to?: Never  Are you married, widowed, divorced, separated, never married, or living with a partner?: Widowed  Housing Stability: Low Risk (06/29/2023)  Received from [facility]  Housing Stability Vital Sign  Unable to Pay for Housing in the Last Year: No  Number of Times Moved in the Last Year: 0  Homeless in the Last Year: No   Objective:   Vitals:  11/16/23 1341  PainSc: 5  PainLoc: Abdomen   There is no height or weight on file to calculate BMI.  Physical Exam Exam conducted with a chaperone present.  Abdominal:  Tenderness: There is abdominal tenderness.  Hernia: A hernia is present. Hernia is present in the ventral area.   Comments: G-tube site left upper quadrant with some excoriation. Port sites from surgery healed. Right lower quadrant hernia palpated. This is quite low at the level of the anterior superior iliac spine could be a very high inguinal hernia. Soft reducible loop.  Neurological:  Mental Status: She is alert.     Assessment and Plan:   Diagnoses and  all orders for this visit:  Ventral hernia without obstruction or gangrene    Patient had previous repair of large paraesophageal hernia Dr. Cameron in May 2025. The right lower quadrant hernia was reduced but not repaired. She was unclear if this was a large inguinal hernia or true spigelian hernia. Recommend laparoscopy to determine feasibility of laparoscopic repair. If feasible laparoscopically, we can proceed with laparoscopic repair of her ventral hernia with mesh. If an open approach would be better, we can certainly convert to that depending on intraoperative findings. I explained to the patient and her daughter. They voiced understanding wish to proceed with surgery this point in time since she is having more discomfort. I offered her appointment Dr. Cameron when she returns from her maternity leave but they do not wish to wait that long.The risk of hernia repair include bleeding, Infection, Recurrence of  the hernia, Mesh use, chronic pain, Organ injury, Bowel injury, Bladder injury, nerve injury with numbness around the incision, Death, and worsening of preexisting medical problems. The alternatives to surgery have been discussed as well.. Long term expectations of both operative and non operative treatments have been discussed. The patient agrees to proceed.   No follow-ups on file.  DEBBY CURTISTINE SHIPPER, MD

## 2024-01-05 ENCOUNTER — Encounter (HOSPITAL_COMMUNITY): Payer: Self-pay | Admitting: Surgery

## 2024-01-05 DIAGNOSIS — K4090 Unilateral inguinal hernia, without obstruction or gangrene, not specified as recurrent: Secondary | ICD-10-CM | POA: Diagnosis not present

## 2024-01-05 MED ORDER — OXYCODONE HCL 5 MG PO TABS: 5.0000 mg | ORAL_TABLET | Freq: Four times a day (QID) | ORAL | 0 refills | Status: DC | PRN

## 2024-01-05 MED ORDER — METHOCARBAMOL 500 MG PO TABS: 500.0000 mg | ORAL_TABLET | Freq: Three times a day (TID) | ORAL | 0 refills | Status: DC | PRN

## 2024-01-05 NOTE — Plan of Care (Signed)
 Discharge instructions gone over with patient and daughter (over the phone) all questions answered.

## 2024-01-05 NOTE — Plan of Care (Signed)
   Problem: Education: Goal: Knowledge of General Education information will improve Description Including pain rating scale, medication(s)/side effects and non-pharmacologic comfort measures Outcome: Progressing

## 2024-01-05 NOTE — Discharge Summary (Signed)
 Physician Discharge Summary  Patient ID: Darlene [name] MRN: [id number] DOB/AGE: Mar 12, 1941 83 y.o.  Admit date: 01/04/2024 Discharge date: 01/05/2024  Admission Diagnoses: Abdominal wall hernia  Discharge Diagnoses:  Principal Problem:   Right inguinal hernia   Discharged Condition: good  Hospital Course: Patient did well.  She was admitted after lap scopic repair of right inguinal hernia with mesh.  She was tolerating her diet and had good pain control.  No nausea or vomiting.  Abdomen soft.  Wounds are clean dry intact.      Treatments: surgery: Laparoscopic repair of right inguinal hernias mesh  Discharge Exam: Blood pressure 132/73, pulse 94, temperature 98.2 F (36.8 C), temperature source Oral, resp. rate 17, height 5' 10 (1.778 m), weight 95.3 kg, SpO2 100%. General appearance: alert Resp: clear to auscultation bilaterally Cardio: Normal sinus rhythm Incision/Wound: Port site clean dry intact.  Swelling and tenderness noted lower abdomen.  Disposition: Discharge disposition: 01-Home or Self Care       Discharge Instructions     Diet - low sodium heart healthy   Complete by: As directed    Increase activity slowly   Complete by: As directed       Allergies as of 01/05/2024       Reactions   Cephalosporins Rash   Tolerated ceftriaxone  06/2023   Keflex [cephalexin] Rash        Medication List     STOP taking these medications    nystatin powder Commonly known as: MYCOSTATIN/NYSTOP       TAKE these medications    acetaminophen  500 MG tablet Commonly known as: TYLENOL  Take 500 mg by mouth every 6 (six) hours as needed for mild pain (pain score 1-3).   albuterol  108 (90 Base) MCG/ACT inhaler Commonly known as: VENTOLIN  HFA Inhale 2 puffs into the lungs every 6 (six) hours as needed for wheezing or shortness of breath.   amiodarone  200 MG tablet Commonly known as: PACERONE  Take 1 tablet (200 mg total) by mouth daily.   apixaban  5 MG  Tabs tablet Commonly known as: Eliquis  Take 1 tablet (5 mg total) by mouth 2 (two) times daily.   busPIRone  5 MG tablet Commonly known as: BUSPAR  Take 1 tablet (5 mg total) by mouth 3 (three) times daily.   escitalopram  5 MG tablet Commonly known as: Lexapro  Take 1 tablet (5 mg total) by mouth daily.   ferrous sulfate  325 (65 FE) MG tablet Take 1 tablet (325 mg total) by mouth every other day.   FIBER PO Take 1 capsule by mouth every evening.   furosemide  20 MG tablet Commonly known as: Lasix  Take 1 tablet (20 mg total) by mouth daily.   linaclotide  72 MCG capsule Commonly known as: Linzess  Take 1 capsule (72 mcg total) by mouth daily before breakfast.   loratadine 10 MG tablet Commonly known as: CLARITIN Take 10 mg by mouth in the morning.   methocarbamol  500 MG tablet Commonly known as: ROBAXIN  Take 1 tablet (500 mg total) by mouth every 8 (eight) hours as needed for muscle spasms.   metoprolol  succinate 50 MG 24 hr tablet Commonly known as: TOPROL -XL Take 1 tablet (50 mg total) by mouth daily.   nitroGLYCERIN  0.4 MG SL tablet Commonly known as: NITROSTAT  Place 1 tablet (0.4 mg total) under the tongue every 5 (five) minutes as needed for chest pain.   oxyCODONE 5 MG immediate release tablet Commonly known as: Oxy IR/ROXICODONE Take 1 tablet (5 mg total) by mouth every 6 (  six) hours as needed for severe pain (pain score 7-10).   pantoprazole  40 MG tablet Commonly known as: Protonix  Take 1 tablet (40 mg total) by mouth 2 (two) times daily.   Potassium 99 MG Tabs Take 99 mg by mouth every evening.   PROBIOTIC DAILY PO Take 1 tablet by mouth in the morning.   Vitamin B-12 2500 MCG Subl Take 2,500 mcg by mouth every Friday.   Vitamin C 500 MG Caps Take 500 mg by mouth in the morning.   Vitamin D3 125 MCG (5000 UT) Tabs Take 5,000 Units by mouth every evening.        Follow-up Information     Jace Fermin, Debby, MD Follow up in 3 week(s).   Specialty:  General Surgery Contact information: [address] [place] [address] [phone]                 Signed: Debby DELENA Shipper 01/05/2024, 7:42 AM

## 2024-01-06 ENCOUNTER — Other Ambulatory Visit

## 2024-01-09 ENCOUNTER — Other Ambulatory Visit: Payer: Self-pay | Admitting: Family Medicine

## 2024-01-09 DIAGNOSIS — J810 Acute pulmonary edema: Secondary | ICD-10-CM

## 2024-01-09 DIAGNOSIS — I119 Hypertensive heart disease without heart failure: Secondary | ICD-10-CM

## 2024-01-09 DIAGNOSIS — I272 Pulmonary hypertension, unspecified: Secondary | ICD-10-CM

## 2024-01-12 ENCOUNTER — Ambulatory Visit: Admitting: Family Medicine

## 2024-01-17 ENCOUNTER — Other Ambulatory Visit

## 2024-01-17 DIAGNOSIS — D509 Iron deficiency anemia, unspecified: Secondary | ICD-10-CM

## 2024-01-17 DIAGNOSIS — E559 Vitamin D deficiency, unspecified: Secondary | ICD-10-CM

## 2024-01-17 DIAGNOSIS — E782 Mixed hyperlipidemia: Secondary | ICD-10-CM

## 2024-01-20 ENCOUNTER — Ambulatory Visit: Admitting: Family Medicine

## 2024-01-20 ENCOUNTER — Encounter: Payer: Self-pay | Admitting: Family Medicine

## 2024-01-20 VITALS — BP 115/72 | HR 76 | Temp 97.6°F | Ht 70.0 in | Wt 208.4 lb

## 2024-01-20 DIAGNOSIS — Z13820 Encounter for screening for osteoporosis: Secondary | ICD-10-CM

## 2024-01-20 DIAGNOSIS — Z0001 Encounter for general adult medical examination with abnormal findings: Secondary | ICD-10-CM

## 2024-01-20 DIAGNOSIS — G2401 Drug induced subacute dyskinesia: Secondary | ICD-10-CM

## 2024-01-20 DIAGNOSIS — E559 Vitamin D deficiency, unspecified: Secondary | ICD-10-CM

## 2024-01-20 DIAGNOSIS — D509 Iron deficiency anemia, unspecified: Secondary | ICD-10-CM | POA: Diagnosis not present

## 2024-01-20 DIAGNOSIS — F330 Major depressive disorder, recurrent, mild: Secondary | ICD-10-CM

## 2024-01-20 DIAGNOSIS — Z79899 Other long term (current) drug therapy: Secondary | ICD-10-CM

## 2024-01-20 DIAGNOSIS — G244 Idiopathic orofacial dystonia: Secondary | ICD-10-CM

## 2024-01-20 DIAGNOSIS — I4891 Unspecified atrial fibrillation: Secondary | ICD-10-CM

## 2024-01-20 DIAGNOSIS — Z0000 Encounter for general adult medical examination without abnormal findings: Secondary | ICD-10-CM | POA: Insufficient documentation

## 2024-01-20 NOTE — Progress Notes (Signed)
 Acute Office Visit  Patient ID: Darlene [name], female    DOB: 1940/10/30, 83 y.o.   MRN: [id number]  PCP: Kayla Jeoffrey RAMAN, FNP  Chief Complaint  Patient presents with   Follow-up    3 month f/u cough and cold w/ scratchy throat and sinus drainage x 1 week  Still having Clenched jaw.      Subjective:     HPI  Darlene [name] is here today for chronic condition management. PMH includes anxiety and depression well controlled on Lexapro  20 mg daily and Buspar  5-10mg  PRN, low back pain and DJD well controlled on PRN flexeril , GERD on Protonix  with upcoming endoscopy and recent discovery of hiatal hernia and spigelian hernia, HLD, afib on Eliquis , diltiazem , and metoprolol , and OSA on CPAP. She does see Gastroenterology, Cardiology, Dermatology, and Neurology.   Jaw clenching: presumed tardive dyskinesia due to reglan  and abrupt cessatoin of lexapro   Anemia: chronic, stable on ferrous sulfate  every other day, no bleeding   Atrial Fibrillation: rate controlled on amiodarone  and eliquis , followed by Cardiology   OSA: followed by Dr Dohmeier, compliant with auto PAP   Esophageal Hernia s/p repair: followed by GI   Depression: well controlled on Lexapro   Discussed the use of AI scribe software for clinical note transcription with the patient, who gave verbal consent to proceed.  History of Present Illness Darlene [name] is an 83 year old female who presents with bowel movement changes and jaw clenching. She is accompanied by her daughter.  She underwent hernia surgery on January 04, 2024, and spent the night in the hospital for precautionary reasons. Since the surgery, she has stopped taking her vitamins, which included vitamins E, C, B12, D, potassium, and iron. Discontinuing these vitamins has resolved her diarrhea, which she was experiencing every other day prior to stopping them. She continues to take fiber and probiotics, which she has been on for a while. Her bowel movements are now  either daily or every other day and are soft and formed.  She is currently taking several medications including Tylenol  as needed, Albuterol  less than weekly, amiodarone , Eliquis , Buspar  5 mg, Lexapro  5 mg, fiber, Lasix  20 mg, Linzess  72 mg, and Protonix  40 mg twice a day. She has not needed nitroglycerin  recently and is not currently taking oxycodone, which was prescribed post-surgery. She also takes metoprolol , and has taken cyclobenzaprine  as needed in the past for muscle relaxation, but experiences fogginess and unsteadiness the next day.  She experiences persistent jaw clenching, which has not improved, and soreness in her teeth. She has not been on a muscle relaxer for this issue. When she takes cyclobenzaprine , she feels foggy and unsteady the next day. No new symptoms, and the mouth quivering she previously experienced has stopped.  She attempted to have blood work done recently, but the phlebotomist was unable to draw sufficient blood. Her last metabolic panel from her hernia repair a month and a half ago showed normal liver function and electrolytes, but kidney function was not assessed. Her cholesterol was checked in July and was normal, with no changes made to her regimen at that time.   Review of Systems  All other systems reviewed and are negative.  Health Maintenance  Topic Date Due   DEXA SCAN  Never done   COVID-19 Vaccine (8 - 2025-26 season) 02/05/2024 (Originally 11/15/2023)   Zoster Vaccines- Shingrix  (2 of 2) 04/21/2024 (Originally 08/10/2023)   Influenza Vaccine  06/13/2024 (Originally 10/15/2023)   Medicare Annual Wellness (AWV)  12/06/2024  Mammogram  05/27/2025   DTaP/Tdap/Td (2 - Td or Tdap) 08/30/2032   Pneumococcal Vaccine: 50+ Years  Completed   Meningococcal B Vaccine  Aged Out   Colonoscopy  Discontinued     Past Medical History:  Diagnosis Date   Anemia    Anxiety    Chronic lower back pain    Depression    DJD (degenerative joint disease)    knees, neck    Dyspnea    Dysrhythmia    A. Fib - on Eliquis    GERD (gastroesophageal reflux disease)    has had dilitation in the past   History of hiatal hernia    Hypertension    Pneumonia 08/2023   after aspiration in surgery- pt was in ICU   Pulmonary hypertension [facility])    Sleep apnea     Past Surgical History:  Procedure Laterality Date   ABDOMINAL HYSTERECTOMY  03/1979   BREAST BIOPSY Left 1986 and 1987   CATARACT EXTRACTION, BILATERAL Bilateral 2014   Dr. Patrina, in [place]   ESOPHAGOGASTRODUODENOSCOPY N/A 07/01/2023   Procedure: EGD (ESOPHAGOGASTRODUODENOSCOPY);  Surgeon: Federico Rosario BROCKS, MD;  Location: [facility] ENDOSCOPY;  Service: Gastroenterology;  Laterality: N/A;  Sedation provided by ICU   [facility]   INSERTION, GASTROSTOMY TUBE, ROBOT-ASSISTED Left 07/21/2023   Procedure: INSERTION, GASTROSTOMY TUBE, ROBOT-ASSISTED;  Surgeon: Signe Mitzie LABOR, MD;  Location: [facility] OR;  Service: General;  Laterality: Left;   LAPAROSCOPY N/A 01/04/2024   Procedure: LAPAROSCOPY, DIAGNOSTIC W/LYSIS OF ADHESIONS;  Surgeon: Vanderbilt Ned, MD;  Location: [facility] OR;  Service: General;  Laterality: N/A;   VENTRAL HERNIA REPAIR N/A 01/04/2024   Procedure: REPAIR, HERNIA, VENTRAL, LAPAROSCOPIC W/MESH;  Surgeon: Vanderbilt Ned, MD;  Location: [facility] OR;  Service: General;  Laterality: N/A;   XI ROBOTIC ASSISTED PARAESOPHAGEAL HERNIA REPAIR N/A 07/21/2023   Procedure: REPAIR, HERNIA, PARAESOPHAGEAL, ROBOT-ASSISTED;  Surgeon: Signe Mitzie LABOR, MD;  Location: [facility] OR;  Service: General;  Laterality: N/A;  ROBOTIC PARAESOPHAGEAL HERNIA REPAIR, ROBOTIC REPAIR OF SPIGELIAN HERNIA    Current Outpatient Medications on File Prior to Visit  Medication Sig Dispense Refill   amiodarone  (PACERONE ) 200 MG tablet Take 1 tablet (200 mg total) by mouth daily. 90 tablet 2   apixaban  (ELIQUIS ) 5 MG TABS tablet Take 1 tablet (5 mg total) by mouth 2 (two) times daily. 200 tablet 2    busPIRone  (BUSPAR ) 5 MG tablet Take 1 tablet (5 mg total) by mouth 3 (three) times daily. 270 tablet 1   escitalopram  (LEXAPRO ) 5 MG tablet Take 1 tablet (5 mg total) by mouth daily. 30 tablet 3   FIBER PO Take 1 capsule by mouth every evening.     furosemide  (LASIX ) 20 MG tablet TAKE 1 TABLET BY MOUTH DAILY 100 tablet 2   linaclotide  (LINZESS ) 72 MCG capsule Take 1 capsule (72 mcg total) by mouth daily before breakfast. 30 capsule 3   loratadine (CLARITIN) 10 MG tablet Take 10 mg by mouth in the morning.     metoprolol  succinate (TOPROL -XL) 50 MG 24 hr tablet Take 1 tablet (50 mg total) by mouth daily. 90 tablet 3   nitroGLYCERIN  (NITROSTAT ) 0.4 MG SL tablet Place 1 tablet (0.4 mg total) under the tongue every 5 (five) minutes as needed for chest pain. 25 tablet 3   pantoprazole  (PROTONIX ) 40 MG tablet Take 1 tablet (40 mg total) by mouth 2 (two) times daily. 200 tablet 1   Probiotic Product (  PROBIOTIC DAILY PO) Take 1 tablet by mouth in the morning.     acetaminophen  (TYLENOL ) 500 MG tablet Take 500 mg by mouth every 6 (six) hours as needed for mild pain (pain score 1-3).     albuterol  (VENTOLIN  HFA) 108 (90 Base) MCG/ACT inhaler Inhale 2 puffs into the lungs every 6 (six) hours as needed for wheezing or shortness of breath. 8 g 2   Ascorbic Acid (VITAMIN C) 500 MG CAPS Take 500 mg by mouth in the morning. (Patient not taking: Reported on 01/20/2024)     Cholecalciferol (VITAMIN D3) 125 MCG (5000 UT) TABS Take 5,000 Units by mouth every evening. (Patient not taking: Reported on 01/20/2024)     Cyanocobalamin  (VITAMIN B-12) 2500 MCG SUBL Take 2,500 mcg by mouth every Friday. (Patient not taking: Reported on 01/20/2024)     ferrous sulfate  325 (65 FE) MG tablet Take 1 tablet (325 mg total) by mouth every other day. (Patient not taking: Reported on 01/20/2024)     methocarbamol  (ROBAXIN ) 500 MG tablet Take 1 tablet (500 mg total) by mouth every 8 (eight) hours as needed for muscle spasms. (Patient not  taking: Reported on 01/20/2024) 12 tablet 0   oxyCODONE (OXY IR/ROXICODONE) 5 MG immediate release tablet Take 1 tablet (5 mg total) by mouth every 6 (six) hours as needed for severe pain (pain score 7-10). (Patient not taking: Reported on 01/20/2024) 15 tablet 0   Potassium 99 MG TABS Take 99 mg by mouth every evening. (Patient not taking: Reported on 01/20/2024)     No current facility-administered medications on file prior to visit.    Allergies  Allergen Reactions   Cephalosporins Rash    Tolerated ceftriaxone  06/2023   Keflex [Cephalexin] Rash       Objective:    BP 115/72   Pulse 76   Temp 97.6 F (36.4 C)   Ht 5' 10 (1.778 m)   Wt 208 lb 6.4 oz (94.5 kg)   SpO2 100%   BMI 29.90 kg/m    Physical Exam Vitals and nursing note reviewed.  Constitutional:      Appearance: Normal appearance. She is normal weight.  HENT:     Head: Normocephalic and atraumatic.  Cardiovascular:     Rate and Rhythm: Normal rate and regular rhythm.     Pulses: Normal pulses.     Heart sounds: Normal heart sounds.  Pulmonary:     Effort: Pulmonary effort is normal.     Breath sounds: Normal breath sounds.  Skin:    General: Skin is warm and dry.  Neurological:     General: No focal deficit present.     Mental Status: She is alert and oriented to person, place, and time. Mental status is at baseline.  Psychiatric:        Mood and Affect: Mood normal.        Behavior: Behavior normal.        Thought Content: Thought content normal.        Judgment: Judgment normal.       No results found for any visits on 01/20/24.     Assessment & Plan:   Problem List Items Addressed This Visit     Depression   Morbid obesity (HCC) - BMI 30+ with OSA   Relevant Orders   Comprehensive metabolic panel with GFR   Vitamin D  deficiency   Relevant Orders   VITAMIN D  25 Hydroxy (Vit-D Deficiency, Fractures)   Iron deficiency anemia   Relevant  Orders   CBC with Differential/Platelet    Comprehensive metabolic panel with GFR   B12 and Folate Panel   VITAMIN D  25 Hydroxy (Vit-D Deficiency, Fractures)   Iron, TIBC and Ferritin Panel   Atrial fibrillation (HCC)   Relevant Orders   EKG 12-Lead   Jaw dyskinesia   Physical exam, annual - Primary   Other Visit Diagnoses       Encounter for osteoporosis screening in asymptomatic postmenopausal patient       Relevant Orders   DG Bone Density     Medication management       Relevant Orders   EKG 12-Lead     Tardive dyskinesia       Relevant Orders   Ambulatory referral to Neurology       Assessment and Plan Assessment & Plan Medication-induced movement disorder (tardive dyskinesia) with jaw pain Persistent jaw clenching with sore teeth. Previous muscle relaxers caused sedation and unsteadiness. Ingrezza considered but cost is prohibitive. - Performed EKG. - Consider Ingrezza if EKG normal and cost manageable. - referral to Neurology - Consider Robaxin  if Ingrezza not feasible.  Unspecified atrial fibrillation Irregular heart rhythm consistent with atrial fibrillation. No new symptoms. - Performed EKG.  Iron deficiency anemia Not currently taking iron supplements. Blood work attempted but unsuccessful. - Attempted blood work for iron levels.  Vitamin D  deficiency Not taking vitamin D  supplements since surgery. Discussed importance of vitamin D  and calcium for bone health. - Restart vitamin D  supplementation. - Ordered DEXA scan at [facility] for bone density assessment.  Screening for osteoporosis No previous DEXA scan performed. Discussed importance of screening for osteoporosis. - Ordered DEXA scan at [facility]    No orders of the defined types were placed in this encounter.   Return in about 6 months (around 07/19/2024) for chronic follow-up with labs 1 week prior.  Jeoffrey GORMAN Barrio, FNP [facility]

## 2024-01-23 ENCOUNTER — Ambulatory Visit: Payer: Self-pay | Admitting: Family Medicine

## 2024-01-25 LAB — IRON,TIBC AND FERRITIN PANEL
%SAT: 29 % (ref 16–45)
Ferritin: 126 ng/mL (ref 16–288)
Iron: 73 ug/dL (ref 45–160)
TIBC: 255 ug/dL (ref 250–450)

## 2024-01-25 LAB — CBC WITH DIFFERENTIAL/PLATELET
Absolute Lymphocytes: 1377 {cells}/uL (ref 850–3900)
Absolute Monocytes: 629 {cells}/uL (ref 200–950)
Basophils Absolute: 51 {cells}/uL (ref 0–200)
Basophils Relative: 0.6 %
Eosinophils Absolute: 153 {cells}/uL (ref 15–500)
Eosinophils Relative: 1.8 %
HCT: 39.7 % (ref 35.0–45.0)
Hemoglobin: 13.3 g/dL (ref 11.7–15.5)
MCH: 32.8 pg (ref 27.0–33.0)
MCHC: 33.5 g/dL (ref 32.0–36.0)
MCV: 97.8 fL (ref 80.0–100.0)
MPV: 11.4 fL (ref 7.5–12.5)
Monocytes Relative: 7.4 %
Neutro Abs: 6290 {cells}/uL (ref 1500–7800)
Neutrophils Relative %: 74 %
Platelets: 291 Thousand/uL (ref 140–400)
RBC: 4.06 Million/uL (ref 3.80–5.10)
RDW: 12.1 % (ref 11.0–15.0)
Total Lymphocyte: 16.2 %
WBC: 8.5 Thousand/uL (ref 3.8–10.8)

## 2024-01-25 LAB — COMPREHENSIVE METABOLIC PANEL WITH GFR
AG Ratio: 1.4 (calc) (ref 1.0–2.5)
ALT: 16 U/L (ref 6–29)
AST: 28 U/L (ref 10–35)
Albumin: 3.9 g/dL (ref 3.6–5.1)
Alkaline phosphatase (APISO): 78 U/L (ref 37–153)
BUN: 17 mg/dL (ref 7–25)
CO2: 19 mmol/L — ABNORMAL LOW (ref 20–32)
Calcium: 9.3 mg/dL (ref 8.6–10.4)
Chloride: 107 mmol/L (ref 98–110)
Creat: 0.78 mg/dL (ref 0.60–0.95)
Globulin: 2.7 g/dL (ref 1.9–3.7)
Glucose, Bld: 84 mg/dL (ref 65–99)
Potassium: 4.3 mmol/L (ref 3.5–5.3)
Sodium: 146 mmol/L (ref 135–146)
Total Bilirubin: 0.3 mg/dL (ref 0.2–1.2)
Total Protein: 6.6 g/dL (ref 6.1–8.1)
eGFR: 75 mL/min/1.73m2 (ref >=60)

## 2024-01-25 LAB — B12 AND FOLATE PANEL
Folate: 16.4 ng/mL
Vitamin B-12: 712 pg/mL (ref 200–1100)

## 2024-01-25 LAB — VITAMIN D 25 HYDROXY (VIT D DEFICIENCY, FRACTURES): Vit D, 25-Hydroxy: 87 ng/mL (ref 30–100)

## 2024-02-04 ENCOUNTER — Ambulatory Visit: Admitting: Family Medicine

## 2024-02-04 ENCOUNTER — Encounter: Payer: Self-pay | Admitting: Family Medicine

## 2024-02-04 VITALS — BP 118/90 | HR 86 | Temp 98.7°F | Ht 70.0 in | Wt 207.6 lb

## 2024-02-04 DIAGNOSIS — J189 Pneumonia, unspecified organism: Secondary | ICD-10-CM | POA: Diagnosis not present

## 2024-02-04 MED ORDER — PREDNISONE 20 MG PO TABS: ORAL_TABLET | ORAL | 0 refills | Status: DC

## 2024-02-04 MED ORDER — DOXYCYCLINE HYCLATE 100 MG PO TABS: 100.0000 mg | ORAL_TABLET | Freq: Two times a day (BID) | ORAL | 0 refills | Status: DC

## 2024-02-04 MED ORDER — AMOXICILLIN-POT CLAVULANATE 875-125 MG PO TABS: 1.0000 | ORAL_TABLET | Freq: Two times a day (BID) | ORAL | 0 refills | Status: DC

## 2024-02-04 MED ORDER — ALBUTEROL SULFATE (2.5 MG/3ML) 0.083% IN NEBU: 2.5000 mg | INHALATION_SOLUTION | Freq: Four times a day (QID) | RESPIRATORY_TRACT | 1 refills | Status: AC | PRN

## 2024-02-04 NOTE — Progress Notes (Signed)
 Subjective:    Patient ID: Darlene [name] Alert, female    DOB: [birth year], 83 y.o.   MRN: [id number]  URI     Patient has been sick for more than 2 weeks.  Symptoms began with a common cold about 2 weeks ago.  It has progressed into chest congestion, shortness of breath, cough with purulent sputum.  She denies any fevers.  She denies any chest pain.  However today on exam she has prominent crackles in the right lower lobe.  She also has diffuse expiratory wheezing and diminished breath sounds bilaterally.  She is coughing frequently throughout our encounter.  I believe she has developed secondary pneumonia.  The patient had a CT scan of the lungs in June that showed bibasilar opacities.  I reviewed the CT scan findings in June.  I feel that that was bilateral pneumonia.  She has not had imaging to show resolution.  She does have a past medical history of being exposed to cotton dust and cotton plant for more than 40 years.  Therefore she would be at high risk for restrictive lung disease.  She also has a history of COPD Past Medical History:  Diagnosis Date   Anemia    Anxiety    Chronic lower back pain    Depression    DJD (degenerative joint disease)    knees, neck   Dyspnea    Dysrhythmia    A. Fib - on Eliquis    GERD (gastroesophageal reflux disease)    has had dilitation in the past   History of hiatal hernia    Hypertension    Pneumonia 08/2023   after aspiration in surgery- pt was in ICU   Pulmonary hypertension [facility])    Sleep apnea    Past Surgical History:  Procedure Laterality Date   ABDOMINAL HYSTERECTOMY  03/1979   BREAST BIOPSY Left 1986 and 1987   CATARACT EXTRACTION, BILATERAL Bilateral 2014   Dr. Patrina, in [place]   ESOPHAGOGASTRODUODENOSCOPY N/A 07/01/2023   Procedure: EGD (ESOPHAGOGASTRODUODENOSCOPY);  Surgeon: Federico Rosario BROCKS, MD;  Location: [facility] ENDOSCOPY;  Service: Gastroenterology;  Laterality: N/A;  Sedation provided by ICU   [facility]   INSERTION, GASTROSTOMY TUBE, ROBOT-ASSISTED Left 07/21/2023   Procedure: INSERTION, GASTROSTOMY TUBE, ROBOT-ASSISTED;  Surgeon: Signe Mitzie LABOR, MD;  Location: [facility] OR;  Service: General;  Laterality: Left;   LAPAROSCOPY N/A 01/04/2024   Procedure: LAPAROSCOPY, DIAGNOSTIC W/LYSIS OF ADHESIONS;  Surgeon: Vanderbilt Ned, MD;  Location: [facility] OR;  Service: General;  Laterality: N/A;   VENTRAL HERNIA REPAIR N/A 01/04/2024   Procedure: REPAIR, HERNIA, VENTRAL, LAPAROSCOPIC W/MESH;  Surgeon: Vanderbilt Ned, MD;  Location: [facility] OR;  Service: General;  Laterality: N/A;   XI ROBOTIC ASSISTED PARAESOPHAGEAL HERNIA REPAIR N/A 07/21/2023   Procedure: REPAIR, HERNIA, PARAESOPHAGEAL, ROBOT-ASSISTED;  Surgeon: Signe Mitzie LABOR, MD;  Location: [facility] OR;  Service: General;  Laterality: N/A;  ROBOTIC PARAESOPHAGEAL HERNIA REPAIR, ROBOTIC REPAIR OF SPIGELIAN HERNIA   Current Outpatient Medications on File Prior to Visit  Medication Sig Dispense Refill   acetaminophen  (TYLENOL ) 500 MG tablet Take 500 mg by mouth every 6 (six) hours as needed for mild pain (pain score 1-3).     albuterol  (VENTOLIN  HFA) 108 (90 Base) MCG/ACT inhaler Inhale 2 puffs into the lungs every 6 (six) hours as needed for wheezing or shortness of breath. 8 g 2   amiodarone  (PACERONE ) 200 MG tablet Take 1 tablet (200  mg total) by mouth daily. 90 tablet 2   apixaban  (ELIQUIS ) 5 MG TABS tablet Take 1 tablet (5 mg total) by mouth 2 (two) times daily. 200 tablet 2   Ascorbic Acid (VITAMIN C) 500 MG CAPS Take 500 mg by mouth in the morning. (Patient not taking: Reported on 02/04/2024)     busPIRone  (BUSPAR ) 5 MG tablet Take 1 tablet (5 mg total) by mouth 3 (three) times daily. 270 tablet 1   Cholecalciferol (VITAMIN D3) 125 MCG (5000 UT) TABS Take 5,000 Units by mouth every evening. (Patient not taking: Reported on 02/04/2024)     Cyanocobalamin  (VITAMIN B-12) 2500 MCG SUBL Take 2,500 mcg by mouth every Friday. (Patient not  taking: Reported on 02/04/2024)     escitalopram  (LEXAPRO ) 5 MG tablet Take 1 tablet (5 mg total) by mouth daily. 30 tablet 3   ferrous sulfate  325 (65 FE) MG tablet Take 1 tablet (325 mg total) by mouth every other day. (Patient not taking: Reported on 02/04/2024)     FIBER PO Take 1 capsule by mouth every evening.     furosemide  (LASIX ) 20 MG tablet TAKE 1 TABLET BY MOUTH DAILY 100 tablet 2   linaclotide  (LINZESS ) 72 MCG capsule Take 1 capsule (72 mcg total) by mouth daily before breakfast. 30 capsule 3   loratadine  (CLARITIN ) 10 MG tablet Take 10 mg by mouth in the morning.     methocarbamol  (ROBAXIN ) 500 MG tablet Take 1 tablet (500 mg total) by mouth every 8 (eight) hours as needed for muscle spasms. (Patient not taking: Reported on 02/04/2024) 12 tablet 0   metoprolol  succinate (TOPROL -XL) 50 MG 24 hr tablet Take 1 tablet (50 mg total) by mouth daily. 90 tablet 3   nitroGLYCERIN  (NITROSTAT ) 0.4 MG SL tablet Place 1 tablet (0.4 mg total) under the tongue every 5 (five) minutes as needed for chest pain. 25 tablet 3   oxyCODONE  (OXY IR/ROXICODONE ) 5 MG immediate release tablet Take 1 tablet (5 mg total) by mouth every 6 (six) hours as needed for severe pain (pain score 7-10). (Patient not taking: Reported on 01/20/2024) 15 tablet 0   pantoprazole  (PROTONIX ) 40 MG tablet Take 1 tablet (40 mg total) by mouth 2 (two) times daily. 200 tablet 1   Potassium 99 MG TABS Take 99 mg by mouth every evening. (Patient not taking: Reported on 01/20/2024)     Probiotic Product (PROBIOTIC DAILY PO) Take 1 tablet by mouth in the morning.     No current facility-administered medications on file prior to visit.   Allergies  Allergen Reactions   Cephalosporins Rash    Tolerated ceftriaxone  06/2023   Keflex [Cephalexin] Rash   Social History   Socioeconomic History   Marital status: Widowed    Spouse name: Not on file   Number of children: 2   Years of education: Not on file   Highest education level: 12th  grade  Occupational History   Occupation: retired    Comment: worked in designer, fashion/clothing  Tobacco Use   Smoking status: Never   Smokeless tobacco: Never  Vaping Use   Vaping status: Never Used  Substance and Sexual Activity   Alcohol use: Not Currently    Comment: 3 drinks per month   Drug use: No   Sexual activity: Not Currently  Other Topics Concern   Not on file  Social History Narrative   Not on file   Social Drivers of Health   Financial Resource Strain: Low Risk  (02/03/2021)  Overall Financial Resource Strain (CARDIA)    Difficulty of Paying Living Expenses: Not very hard  Food Insecurity: No Food Insecurity (01/04/2024)   Hunger Vital Sign    Worried About Running Out of Food in the Last Year: Never true    Ran Out of Food in the Last Year: Never true  Transportation Needs: No Transportation Needs (01/04/2024)   PRAPARE - Administrator, Civil Service (Medical): No    Lack of Transportation (Non-Medical): No  Physical Activity: Inactive (04/28/2019)   Exercise Vital Sign    Days of Exercise per Week: 0 days    Minutes of Exercise per Session: 0 min  Stress: No Stress Concern Present (04/28/2019)   [employer] of Occupational Health - Occupational Stress Questionnaire    Feeling of Stress : Only a little  Social Connections: Socially Isolated (01/04/2024)   Social Connection and Isolation Panel    Frequency of Communication with Friends and Family: More than three times a week    Frequency of Social Gatherings with Friends and Family: Never    Attends Religious Services: Never    Database Administrator or Organizations: No    Attends Banker Meetings: Never    Marital Status: Widowed  Intimate Partner Violence: Unknown (01/04/2024)   Humiliation, Afraid, Rape, and Kick questionnaire    Fear of Current or Ex-Partner: No    Emotionally Abused: No    Physically Abused: Not on file    Sexually Abused: No     Review of Systems  All other  systems reviewed and are negative.      Objective:   Physical Exam Constitutional:      General: She is not in acute distress.    Appearance: She is ill-appearing. She is not toxic-appearing.  HENT:     Right Ear: Tympanic membrane and ear canal normal.     Left Ear: Tympanic membrane and ear canal normal.     Nose: Congestion present. No rhinorrhea.     Mouth/Throat:     Pharynx: No oropharyngeal exudate or posterior oropharyngeal erythema.  Eyes:     Conjunctiva/sclera: Conjunctivae normal.  Cardiovascular:     Heart sounds: Normal heart sounds.  Pulmonary:     Breath sounds: Wheezing, rhonchi and rales present.  Lymphadenopathy:     Cervical: No cervical adenopathy.  Neurological:     Mental Status: She is alert.           Assessment & Plan:   Pneumonia of right lower lobe due to infectious organism Begin Augmentin  875 mg twice daily for 10 days.  Patient is on amiodarone  so I will cover atypical infections with doxycycline  100 mg p.o. twice daily for 7 days.  The patient is wheezing so I have suggested a prednisone  taper pack coupled with albuterol  2.5 mg nebs every 6 hours.  Recheck next week to ensure improvement or seek medical attention immediately over the weekend if worsening.  Likely would benefit from repeat imaging in 4 weeks to ensure resolution

## 2024-02-09 ENCOUNTER — Ambulatory Visit (INDEPENDENT_AMBULATORY_CARE_PROVIDER_SITE_OTHER): Admitting: Family Medicine

## 2024-02-09 ENCOUNTER — Encounter: Payer: Self-pay | Admitting: Family Medicine

## 2024-02-09 VITALS — BP 119/74 | HR 98 | Temp 98.2°F | Ht 70.0 in | Wt 212.0 lb

## 2024-02-09 DIAGNOSIS — J189 Pneumonia, unspecified organism: Secondary | ICD-10-CM

## 2024-02-09 DIAGNOSIS — H1132 Conjunctival hemorrhage, left eye: Secondary | ICD-10-CM

## 2024-02-09 NOTE — Progress Notes (Signed)
 Subjective:  HPI: Darlene [name] is a 83 y.o. female presenting on 02/09/2024 for Acute Visit (Follow up on cold sx - RED eye since Sunday night & uncomfortable & slight cloudy./Cough & runny nose approved slighty)   HPI Patient is in today for follow up for pneumonia. She was seen last week by my supervising physican and diagnosed with pneumonia of right lung. Started on Augmentin , Doxycycline , and Prednisone  with albuterol  nebulizer. Her symptoms are overall improving. She is completing her course of amoxicillin . Denies SOB, wheezing, pleurisy, fever, chills, or body aches. Other concerns include a ruptured blood vessel in her left eye. This is not painful and she denies visio changes. This happened Sunday and is improving as well.   Review of Systems  All other systems reviewed and are negative.   Relevant past medical history reviewed and updated as indicated.   Past Medical History:  Diagnosis Date   Anemia    Anxiety    Chronic lower back pain    Depression    DJD (degenerative joint disease)    knees, neck   Dyspnea    Dysrhythmia    A. Fib - on Eliquis    GERD (gastroesophageal reflux disease)    has had dilitation in the past   History of hiatal hernia    Hypertension    Pneumonia 08/2023   after aspiration in surgery- pt was in ICU   Pulmonary hypertension [facility])    Sleep apnea      Past Surgical History:  Procedure Laterality Date   ABDOMINAL HYSTERECTOMY  03/1979   BREAST BIOPSY Left 1986 and 1987   CATARACT EXTRACTION, BILATERAL Bilateral 2014   Dr. Patrina, in [place]   ESOPHAGOGASTRODUODENOSCOPY N/A 07/01/2023   Procedure: EGD (ESOPHAGOGASTRODUODENOSCOPY);  Surgeon: Federico Rosario BROCKS, MD;  Location: [facility] ENDOSCOPY;  Service: Gastroenterology;  Laterality: N/A;  Sedation provided by ICU   [facility]   INSERTION, GASTROSTOMY TUBE, ROBOT-ASSISTED Left 07/21/2023   Procedure: INSERTION, GASTROSTOMY TUBE,  ROBOT-ASSISTED;  Surgeon: Signe Mitzie LABOR, MD;  Location: [facility] OR;  Service: General;  Laterality: Left;   LAPAROSCOPY N/A 01/04/2024   Procedure: LAPAROSCOPY, DIAGNOSTIC W/LYSIS OF ADHESIONS;  Surgeon: Vanderbilt Ned, MD;  Location: [facility] OR;  Service: General;  Laterality: N/A;   VENTRAL HERNIA REPAIR N/A 01/04/2024   Procedure: REPAIR, HERNIA, VENTRAL, LAPAROSCOPIC W/MESH;  Surgeon: Vanderbilt Ned, MD;  Location: [facility] OR;  Service: General;  Laterality: N/A;   XI ROBOTIC ASSISTED PARAESOPHAGEAL HERNIA REPAIR N/A 07/21/2023   Procedure: REPAIR, HERNIA, PARAESOPHAGEAL, ROBOT-ASSISTED;  Surgeon: Signe Mitzie LABOR, MD;  Location: [facility] OR;  Service: General;  Laterality: N/A;  ROBOTIC PARAESOPHAGEAL HERNIA REPAIR, ROBOTIC REPAIR OF SPIGELIAN HERNIA    Allergies and medications reviewed and updated.   Current Outpatient Medications:    acetaminophen  (TYLENOL ) 500 MG tablet, Take 500 mg by mouth every 6 (six) hours as needed for mild pain (pain score 1-3)., Disp: , Rfl:    albuterol  (PROVENTIL ) (2.5 MG/3ML) 0.083% nebulizer solution, Take 3 mLs (2.5 mg total) by nebulization every 6 (six) hours as needed for wheezing or shortness of breath., Disp: 150 mL, Rfl: 1   albuterol  (VENTOLIN  HFA) 108 (90 Base) MCG/ACT inhaler, Inhale 2 puffs into the lungs every 6 (six) hours as needed for wheezing or shortness of breath., Disp: 8 g, Rfl: 2   amiodarone  (PACERONE ) 200 MG tablet, Take 1 tablet (200 mg total) by mouth daily., Disp: 90 tablet,  Rfl: 2   amoxicillin -clavulanate (AUGMENTIN ) 875-125 MG tablet, Take 1 tablet by mouth 2 (two) times daily., Disp: 20 tablet, Rfl: 0   apixaban  (ELIQUIS ) 5 MG TABS tablet, Take 1 tablet (5 mg total) by mouth 2 (two) times daily., Disp: 200 tablet, Rfl: 2   busPIRone  (BUSPAR ) 5 MG tablet, Take 1 tablet (5 mg total) by mouth 3 (three) times daily., Disp: 270 tablet, Rfl: 1   doxycycline  (VIBRA -TABS) 100 MG tablet, Take 1 tablet (100 mg total) by mouth 2 (two) times daily., Disp:  14 tablet, Rfl: 0   escitalopram  (LEXAPRO ) 5 MG tablet, Take 1 tablet (5 mg total) by mouth daily., Disp: 30 tablet, Rfl: 3   FIBER PO, Take 1 capsule by mouth every evening., Disp: , Rfl:    furosemide  (LASIX ) 20 MG tablet, TAKE 1 TABLET BY MOUTH DAILY, Disp: 100 tablet, Rfl: 2   linaclotide  (LINZESS ) 72 MCG capsule, Take 1 capsule (72 mcg total) by mouth daily before breakfast., Disp: 30 capsule, Rfl: 3   loratadine  (CLARITIN ) 10 MG tablet, Take 10 mg by mouth in the morning., Disp: , Rfl:    metoprolol  succinate (TOPROL -XL) 50 MG 24 hr tablet, Take 1 tablet (50 mg total) by mouth daily., Disp: 90 tablet, Rfl: 3   nitroGLYCERIN  (NITROSTAT ) 0.4 MG SL tablet, Place 1 tablet (0.4 mg total) under the tongue every 5 (five) minutes as needed for chest pain., Disp: 25 tablet, Rfl: 3   pantoprazole  (PROTONIX ) 40 MG tablet, Take 1 tablet (40 mg total) by mouth 2 (two) times daily., Disp: 200 tablet, Rfl: 1   predniSONE  (DELTASONE ) 20 MG tablet, 3 tabs poqday 1-2, 2 tabs poqday 3-4, 1 tab poqday 5-6, Disp: 12 tablet, Rfl: 0   Probiotic Product (PROBIOTIC DAILY PO), Take 1 tablet by mouth in the morning., Disp: , Rfl:    Ascorbic Acid (VITAMIN C) 500 MG CAPS, Take 500 mg by mouth in the morning. (Patient not taking: Reported on 02/09/2024), Disp: , Rfl:    Cholecalciferol (VITAMIN D3) 125 MCG (5000 UT) TABS, Take 5,000 Units by mouth every evening. (Patient not taking: Reported on 02/09/2024), Disp: , Rfl:    Cyanocobalamin  (VITAMIN B-12) 2500 MCG SUBL, Take 2,500 mcg by mouth every Friday. (Patient not taking: Reported on 02/09/2024), Disp: , Rfl:    ferrous sulfate  325 (65 FE) MG tablet, Take 1 tablet (325 mg total) by mouth every other day. (Patient not taking: Reported on 02/09/2024), Disp: , Rfl:    methocarbamol  (ROBAXIN ) 500 MG tablet, Take 1 tablet (500 mg total) by mouth every 8 (eight) hours as needed for muscle spasms. (Patient not taking: Reported on 02/09/2024), Disp: 12 tablet, Rfl: 0    oxyCODONE  (OXY IR/ROXICODONE ) 5 MG immediate release tablet, Take 1 tablet (5 mg total) by mouth every 6 (six) hours as needed for severe pain (pain score 7-10). (Patient not taking: Reported on 02/09/2024), Disp: 15 tablet, Rfl: 0   Potassium 99 MG TABS, Take 99 mg by mouth every evening. (Patient not taking: Reported on 02/09/2024), Disp: , Rfl:   Allergies  Allergen Reactions   Cephalosporins Rash    Tolerated ceftriaxone  06/2023   Keflex [Cephalexin] Rash    Objective:   BP 119/74   Pulse 98   Temp 98.2 F (36.8 C)   Ht 5' 10 (1.778 m)   Wt 212 lb (96.2 kg)   SpO2 99%   BMI 30.42 kg/m      02/09/2024   11:46 AM 02/04/2024  9:53 AM 01/20/2024    9:24 AM  Vitals with BMI  Height 5' 10 5' 10 5' 10  Weight 212 lbs 207 lbs 10 oz 208 lbs 6 oz  BMI 30.42 29.79 29.9  Systolic 119 118 884  Diastolic 74 90 72  Pulse 98 86 76     Physical Exam Vitals and nursing note reviewed.  Constitutional:      Appearance: Normal appearance. She is normal weight.  HENT:     Head: Normocephalic and atraumatic.  Cardiovascular:     Rate and Rhythm: Normal rate and regular rhythm.     Pulses: Normal pulses.     Heart sounds: Normal heart sounds.  Pulmonary:     Effort: Pulmonary effort is normal.     Breath sounds: Normal breath sounds. No wheezing or rhonchi.  Skin:    General: Skin is warm and dry.  Neurological:     General: No focal deficit present.     Mental Status: She is alert and oriented to person, place, and time. Mental status is at baseline.  Psychiatric:        Mood and Affect: Mood normal.        Behavior: Behavior normal.        Thought Content: Thought content normal.        Judgment: Judgment normal.     Assessment & Plan:  Pneumonia of right lower lobe due to infectious organism Assessment & Plan: Symptoms improving. Complete medications as prescribed. May continue Albuterol . Seek medical care if symptoms worsen. Repeat CXR in 3 weeks.     Conjunctival hemorrhage of left eye Assessment & Plan: No red flags on exam. Monitor for worsening symptoms, vision changes, or eye pain and seek medical care if any of the above.       Follow up plan: Return if symptoms worsen or fail to improve.  Jeoffrey GORMAN Barrio, FNP

## 2024-02-09 NOTE — Addendum Note (Signed)
 Addended by: KAYLA JEOFFREY RAMAN on: 02/09/2024 12:20 PM   Modules accepted: Orders

## 2024-02-09 NOTE — Assessment & Plan Note (Signed)
 Symptoms improving. Complete medications as prescribed. May continue Albuterol . Seek medical care if symptoms worsen. Repeat CXR in 3 weeks.

## 2024-02-09 NOTE — Assessment & Plan Note (Signed)
 No red flags on exam. Monitor for worsening symptoms, vision changes, or eye pain and seek medical care if any of the above.

## 2024-02-09 NOTE — Progress Notes (Deleted)
 Acute Office Visit  Patient ID: Darlene [name], female    DOB: [birth year], 83 y.o.   MRN: [id number]  PCP: Kayla Jeoffrey RAMAN, FNP  No chief complaint on file.    Subjective:     HPI  Discussed the use of AI scribe software for clinical note transcription with the patient, who gave verbal consent to proceed.  History of Present Illness    ROS  Past Medical History:  Diagnosis Date  . Anemia   . Anxiety   . Chronic lower back pain   . Depression   . DJD (degenerative joint disease)    knees, neck  . Dyspnea   . Dysrhythmia    A. Fib - on Eliquis   . GERD (gastroesophageal reflux disease)    has had dilitation in the past  . History of hiatal hernia   . Hypertension   . Pneumonia 08/2023   after aspiration in surgery- pt was in ICU  . Pulmonary hypertension (HCC)   . Sleep apnea     Past Surgical History:  Procedure Laterality Date  . ABDOMINAL HYSTERECTOMY  03/1979  . BREAST BIOPSY Left 1986 and 1987  . CATARACT EXTRACTION, BILATERAL Bilateral 2014   Dr. Patrina, in [place]  . ESOPHAGOGASTRODUODENOSCOPY N/A 07/01/2023   Procedure: EGD (ESOPHAGOGASTRODUODENOSCOPY);  Surgeon: Federico Rosario BROCKS, MD;  Location: [facility] ENDOSCOPY;  Service: Gastroenterology;  Laterality: N/A;  Sedation provided by ICU  . EYE SURG[facility]  2024   [facility]  . INSERTION, GASTROSTOMY TUBE, ROBOT-ASSISTED Left 07/21/2023   Procedure: INSERTION, GASTROSTOMY TUBE, ROBOT-ASSISTED;  Surgeon: Signe Mitzie LABOR, MD;  Location: [facility] OR;  Service: General;  Laterality: Left;  . LAPAROSCOPY N/A 01/04/2024   Procedure: LAPAROSCOPY, DIAGNOSTIC W/LYSIS OF ADHESIONS;  Surgeon: Vanderbilt Ned, MD;  Location: [facility] OR;  Service: General;  Laterality: N/A;  . VENTRAL HERNIA REPAIR N/A 01/04/2024   Procedure: REPAIR, HERNIA, VENTRAL, LAPAROSCOPIC W/MESH;  Surgeon: Vanderbilt Ned, MD;  Location: [facility] OR;  Service: General;  Laterality: N/A;  . XI ROBOTIC ASSISTED PARAESOPHAGEAL HERNIA REPAIR  N/A 07/21/2023   Procedure: REPAIR, HERNIA, PARAESOPHAGEAL, ROBOT-ASSISTED;  Surgeon: Signe Mitzie LABOR, MD;  Location: [facility] OR;  Service: General;  Laterality: N/A;  ROBOTIC PARAESOPHAGEAL HERNIA REPAIR, ROBOTIC REPAIR OF SPIGELIAN HERNIA    Current Outpatient Medications on File Prior to Visit  Medication Sig Dispense Refill  . acetaminophen  (TYLENOL ) 500 MG tablet Take 500 mg by mouth every 6 (six) hours as needed for mild pain (pain score 1-3).    . albuterol  (PROVENTIL ) (2.5 MG/3ML) 0.083% nebulizer solution Take 3 mLs (2.5 mg total) by nebulization every 6 (six) hours as needed for wheezing or shortness of breath. 150 mL 1  . albuterol  (VENTOLIN  HFA) 108 (90 Base) MCG/ACT inhaler Inhale 2 puffs into the lungs every 6 (six) hours as needed for wheezing or shortness of breath. 8 g 2  . amiodarone  (PACERONE ) 200 MG tablet Take 1 tablet (200 mg total) by mouth daily. 90 tablet 2  . amoxicillin -clavulanate (AUGMENTIN ) 875-125 MG tablet Take 1 tablet by mouth 2 (two) times daily. 20 tablet 0  . apixaban  (ELIQUIS ) 5 MG TABS tablet Take 1 tablet (5 mg total) by mouth 2 (two) times daily. 200 tablet 2  . Ascorbic Acid (VITAMIN C) 500 MG CAPS Take 500 mg by mouth in the morning. (Patient not taking: Reported on 02/04/2024)    . busPIRone  (BUSPAR ) 5 MG tablet Take 1 tablet (5 mg total) by mouth 3 (  three) times daily. 270 tablet 1  . Cholecalciferol (VITAMIN D3) 125 MCG (5000 UT) TABS Take 5,000 Units by mouth every evening. (Patient not taking: Reported on 02/04/2024)    . Cyanocobalamin  (VITAMIN B-12) 2500 MCG SUBL Take 2,500 mcg by mouth every Friday. (Patient not taking: Reported on 02/04/2024)    . doxycycline  (VIBRA -TABS) 100 MG tablet Take 1 tablet (100 mg total) by mouth 2 (two) times daily. 14 tablet 0  . escitalopram  (LEXAPRO ) 5 MG tablet Take 1 tablet (5 mg total) by mouth daily. 30 tablet 3  . ferrous sulfate  325 (65 FE) MG tablet Take 1 tablet (325 mg total) by mouth every other day. (Patient  not taking: Reported on 02/04/2024)    . FIBER PO Take 1 capsule by mouth every evening.    . furosemide  (LASIX ) 20 MG tablet TAKE 1 TABLET BY MOUTH DAILY 100 tablet 2  . linaclotide  (LINZESS ) 72 MCG capsule Take 1 capsule (72 mcg total) by mouth daily before breakfast. 30 capsule 3  . loratadine  (CLARITIN ) 10 MG tablet Take 10 mg by mouth in the morning.    . methocarbamol  (ROBAXIN ) 500 MG tablet Take 1 tablet (500 mg total) by mouth every 8 (eight) hours as needed for muscle spasms. (Patient not taking: Reported on 02/04/2024) 12 tablet 0  . metoprolol  succinate (TOPROL -XL) 50 MG 24 hr tablet Take 1 tablet (50 mg total) by mouth daily. 90 tablet 3  . nitroGLYCERIN  (NITROSTAT ) 0.4 MG SL tablet Place 1 tablet (0.4 mg total) under the tongue every 5 (five) minutes as needed for chest pain. 25 tablet 3  . oxyCODONE  (OXY IR/ROXICODONE ) 5 MG immediate release tablet Take 1 tablet (5 mg total) by mouth every 6 (six) hours as needed for severe pain (pain score 7-10). (Patient not taking: Reported on 01/20/2024) 15 tablet 0  . pantoprazole  (PROTONIX ) 40 MG tablet Take 1 tablet (40 mg total) by mouth 2 (two) times daily. 200 tablet 1  . Potassium 99 MG TABS Take 99 mg by mouth every evening. (Patient not taking: Reported on 01/20/2024)    . predniSONE  (DELTASONE ) 20 MG tablet 3 tabs poqday 1-2, 2 tabs poqday 3-4, 1 tab poqday 5-6 12 tablet 0  . Probiotic Product (PROBIOTIC DAILY PO) Take 1 tablet by mouth in the morning.     No current facility-administered medications on file prior to visit.    Allergies  Allergen Reactions  . Cephalosporins Rash    Tolerated ceftriaxone  06/2023  . Keflex [Cephalexin] Rash       Objective:    There were no vitals taken for this visit. {Vitals History (Optional):23777}  Physical Exam  {PhysExam Abridge (Optional):210964309}  No results found for any visits on 02/09/24.     Assessment & Plan:   Problem List Items Addressed This Visit    None   Assessment and Plan Assessment & Plan     No orders of the defined types were placed in this encounter.   No follow-ups on file.  Jeoffrey GORMAN Barrio, FNP [facility]

## 2024-02-22 NOTE — Patient Instructions (Incomplete)

## 2024-02-22 NOTE — Progress Notes (Unsigned)
 PATIENT: Darlene [name] DOB: 12-19-1940  REASON FOR VISIT: follow up HISTORY FROM: patient  No chief complaint on file.    HISTORY OF PRESENT ILLNESS:  02/22/24 ALL:  Darlene [name] returns for follow up for OSA on CPAP.     02/23/2024 ALL:  Darlene [name] returns for follow up for OSA on CPAP. She continues to use CPAP daily. She gets about 5 hours of sleep a night. She reports fighting with her mask and tubing every night. She is using a nasal pillow mask. She has tried multiple and feels this is the best fit for her. She reports significant fatigue. She takes multiple naps daily. Some are short and others are a couple hours. She does not use CPAP during naps as she is usually in a recliner. She goes to bed around 9am. She reads until around 10p. She will usually sleep until 3-4am. She does not exercise. She had chronic afib. She is on multiple medications that can contribute to fatigue.     02/17/2022 ALL:  Darlene [name] returns for follow up for OSA on CPAP. She is doing well on therapy. She is using CPAP nightly for about 6-7 hours. She does feel improved sleep quality and wakes refreshed. ESS and FSS elevated but she does not feel sleepiness limits functioning. She rests when she can. She feels atrial fib and med side effects cause her to feel so tired.     09/10/2021 ALL: Darlene [name] returns for follow up for OSA on CPAP. She continues to struggle with finding a mask that works well for her. She has tried two different nasal pillow masks and a traditional FFM. She has leaks with most all or has difficulty with dry mouth. She used a nasal pillow that seemed to work best but caused skin irritation to nares. She has not tried a barrier cream. She has not noted any significant benefit in how she feels with using therapy. She uses therapy for about 5 hours. Once she wakes to urinate at night she removes therapy and does not resume.   Compliance report dated 08/10/2021-09/08/2021 shows she used CPAP 30/30 days. She  used therapy greater than 4 hours 27/30 days (90%). Average usage was 5hr . Residual AHI 5.8/hr on 7-17cmH20. Large leak noted in 95th percentile of 30.9 l/min.   05/20/2020 ALL: Darlene [name] is a 83 y.o. female here today for follow up for OSA on CPAP.  She is doing well on therapy. She is not sure that she feels any better but recognizes need for therapy. She switched to a nasal pillow but feels it is causing tenderness of her nares. She would like to try a nasal mask. Otherwise, doing very well.      HISTORY: (copied from Dr Dohmeier's previous note)  Rv 11-14-2019-   Darlene [name] is a 83 year old white female patient seen here for a revisit  on 11/14/2019 ; I had ordered sleep studies after her last visit here with me which was actually her first and the patient presented on [id number] in lab sleep study which had been ordered requested by her primary care physician as well as by Dr. Charls her cardiologist at the time.  Patient has known atrial fibrillation congestive due to diastolic heart failure.  Apnea hypopnea index and that study was 11/h REM AHI was 19.9 non-REM AHI was 8.8 and in supine position while sleeping on her back, her AHI was 17.2 overall this was mild apnea but with the lack of  oxygen desaturation time.  And in addition it appeared to be a classic apnea slightly more central events and obstructive.  For this reason we did not want to autotitrator also to return for CPAP titration.  That took place on 07-11-19 and here now the patient was put first exposed to CPAP but it did not work CPAP titration left her with a residual apnea index of 10.4 for that reason she had to be titrated to BiPAP CPAP highest pressure tried was 16 cmH2O.   On the BiPAP of 15 under 19 there was no reduction of the apnea either she still had 30 apneas per hour.  The technician gave her a Mirage Quatro mask and medium full facemask.  She remained was hypoxic oxygen saturation for much of the study  diagnosis was severe central sleep apnea now treatment emerging failure to alleviate on the CPAP or BiPAP.   Foot attempts to control her apnea at this time she was titrated to ASV which she responded to beautifully.  A very first sleep study had documented an AHI of 53.7 on May 24, 2019 her ASV study allowed an AHI of 0.0 apneas per hour on the a pressure of 10 cmH2O.   I have now seen her download- she has been placed on auto CPAP and is doing well, we will change the mask form a FFM ( ? Why was she given a FFM?) to the desired nasal mask. She would like an ESON medium mask.    REVIEW OF SYSTEMS: Out of a complete 14 system review of symptoms, the patient complains only of the following symptoms, fatigue and all other reviewed systems are negative.  ESS 13/24 FSS: 63   ALLERGIES: Allergies  Allergen Reactions   Cephalosporins Rash    Tolerated ceftriaxone  06/2023   Keflex [Cephalexin] Rash    HOME MEDICATIONS: Outpatient Medications Prior to Visit  Medication Sig Dispense Refill   acetaminophen  (TYLENOL ) 500 MG tablet Take 500 mg by mouth every 6 (six) hours as needed for mild pain (pain score 1-3).     albuterol  (PROVENTIL ) (2.5 MG/3ML) 0.083% nebulizer solution Take 3 mLs (2.5 mg total) by nebulization every 6 (six) hours as needed for wheezing or shortness of breath. 150 mL 1   albuterol  (VENTOLIN  HFA) 108 (90 Base) MCG/ACT inhaler Inhale 2 puffs into the lungs every 6 (six) hours as needed for wheezing or shortness of breath. 8 g 2   amiodarone  (PACERONE ) 200 MG tablet Take 1 tablet (200 mg total) by mouth daily. 90 tablet 2   amoxicillin -clavulanate (AUGMENTIN ) 875-125 MG tablet Take 1 tablet by mouth 2 (two) times daily. 20 tablet 0   apixaban  (ELIQUIS ) 5 MG TABS tablet Take 1 tablet (5 mg total) by mouth 2 (two) times daily. 200 tablet 2   Ascorbic Acid (VITAMIN C) 500 MG CAPS Take 500 mg by mouth in the morning. (Patient not taking: Reported on 02/09/2024)     busPIRone   (BUSPAR ) 5 MG tablet Take 1 tablet (5 mg total) by mouth 3 (three) times daily. 270 tablet 1   Cholecalciferol (VITAMIN D3) 125 MCG (5000 UT) TABS Take 5,000 Units by mouth every evening. (Patient not taking: Reported on 02/09/2024)     Cyanocobalamin  (VITAMIN B-12) 2500 MCG SUBL Take 2,500 mcg by mouth every Friday. (Patient not taking: Reported on 02/09/2024)     doxycycline  (VIBRA -TABS) 100 MG tablet Take 1 tablet (100 mg total) by mouth 2 (two) times daily. 14 tablet 0   escitalopram  (LEXAPRO )  5 MG tablet Take 1 tablet (5 mg total) by mouth daily. 30 tablet 3   ferrous sulfate  325 (65 FE) MG tablet Take 1 tablet (325 mg total) by mouth every other day. (Patient not taking: Reported on 02/09/2024)     FIBER PO Take 1 capsule by mouth every evening.     furosemide  (LASIX ) 20 MG tablet TAKE 1 TABLET BY MOUTH DAILY 100 tablet 2   linaclotide  (LINZESS ) 72 MCG capsule Take 1 capsule (72 mcg total) by mouth daily before breakfast. 30 capsule 3   loratadine  (CLARITIN ) 10 MG tablet Take 10 mg by mouth in the morning.     methocarbamol  (ROBAXIN ) 500 MG tablet Take 1 tablet (500 mg total) by mouth every 8 (eight) hours as needed for muscle spasms. (Patient not taking: Reported on 02/09/2024) 12 tablet 0   metoprolol  succinate (TOPROL -XL) 50 MG 24 hr tablet Take 1 tablet (50 mg total) by mouth daily. 90 tablet 3   nitroGLYCERIN  (NITROSTAT ) 0.4 MG SL tablet Place 1 tablet (0.4 mg total) under the tongue every 5 (five) minutes as needed for chest pain. 25 tablet 3   oxyCODONE  (OXY IR/ROXICODONE ) 5 MG immediate release tablet Take 1 tablet (5 mg total) by mouth every 6 (six) hours as needed for severe pain (pain score 7-10). (Patient not taking: Reported on 02/09/2024) 15 tablet 0   pantoprazole  (PROTONIX ) 40 MG tablet Take 1 tablet (40 mg total) by mouth 2 (two) times daily. 200 tablet 1   Potassium 99 MG TABS Take 99 mg by mouth every evening. (Patient not taking: Reported on 02/09/2024)     predniSONE   (DELTASONE ) 20 MG tablet 3 tabs poqday 1-2, 2 tabs poqday 3-4, 1 tab poqday 5-6 12 tablet 0   Probiotic Product (PROBIOTIC DAILY PO) Take 1 tablet by mouth in the morning.     No facility-administered medications prior to visit.    PAST MEDICAL HISTORY: Past Medical History:  Diagnosis Date   Anemia    Anxiety    Chronic lower back pain    Depression    DJD (degenerative joint disease)    knees, neck   Dyspnea    Dysrhythmia    A. Fib - on Eliquis    GERD (gastroesophageal reflux disease)    has had dilitation in the past   History of hiatal hernia    Hypertension    Pneumonia 08/2023   after aspiration in surgery- pt was in ICU   Pulmonary hypertension (HCC)    Sleep apnea     PAST SURGICAL HISTORY: Past Surgical History:  Procedure Laterality Date   ABDOMINAL HYSTERECTOMY  03/1979   BREAST BIOPSY Left 1986 and 1987   CATARACT EXTRACTION, BILATERAL Bilateral 2014   Dr. Patrina, in [place]   ESOPHAGOGASTRODUODENOSCOPY N/A 07/01/2023   Procedure: EGD (ESOPHAGOGASTRODUODENOSCOPY);  Surgeon: Federico Rosario BROCKS, MD;  Location[facility] ENDOSCOPY;  Service: Gastroenterology;  Laterality: N/A;  Sedation provided by ICU   [facility]   INSERTION, GASTROSTOMY TUBE, ROBOT-ASSISTED Left 07/21/2023   Procedure: INSERTION, GASTROSTOMY TUBE, ROBOT-ASSISTED;  Surgeon: Signe Mitzie LABOR, MD;  Location: [facility] OR;  Service: General;  Laterality: Left;   LAPAROSCOPY N/A 01/04/2024   Procedure: LAPAROSCOPY, DIAGNOSTIC W/LYSIS OF ADHESIONS;  Surgeon: Vanderbilt Ned, MD;  Location: [facility] OR;  Service: General;  Laterality: N/A;   VENTRAL HERNIA REPAIR N/A 01/04/2024   Procedure: REPAIR, HERNIA, VENTRAL, LAPAROSCOPIC W/MESH;  Surgeon: Vanderbilt Ned, MD;  Location:  [facility] OR;  Service: General;  Laterality: N/A;   XI ROBOTIC ASSISTED PARAESOPHAGEAL HERNIA REPAIR N/A 07/21/2023   Procedure: REPAIR, HERNIA, PARAESOPHAGEAL, ROBOT-ASSISTED;  Surgeon: Signe Mitzie LABOR,  MD;  Location: [facility] OR;  Service: General;  Laterality: N/A;  ROBOTIC PARAESOPHAGEAL HERNIA REPAIR, ROBOTIC REPAIR OF SPIGELIAN HERNIA    FAMILY HISTORY: Family History  Problem Relation Age of Onset   Hypertension Mother    Heart disease Mother    Hypertension Father    Heart disease Father    Tremor Sister    Dementia Brother    Hypertension Daughter    Colon cancer Neg Hx    Esophageal cancer Neg Hx    Liver disease Neg Hx     SOCIAL HISTORY: Social History   Socioeconomic History   Marital status: Widowed    Spouse name: Not on file   Number of children: 2   Years of education: Not on file   Highest education level: 12th grade  Occupational History   Occupation: retired    Comment: worked in designer, fashion/clothing  Tobacco Use   Smoking status: Never   Smokeless tobacco: Never  Vaping Use   Vaping status: Never Used  Substance and Sexual Activity   Alcohol use: Not Currently    Comment: 3 drinks per month   Drug use: No   Sexual activity: Not Currently  Other Topics Concern   Not on file  Social History Narrative   Not on file   Social Drivers of Health   Financial Resource Strain: Low Risk  (02/03/2021)   Overall Financial Resource Strain (CARDIA)    Difficulty of Paying Living Expenses: Not very hard  Food Insecurity: No Food Insecurity (01/04/2024)   Hunger Vital Sign    Worried About Running Out of Food in the Last Year: Never true    Ran Out of Food in the Last Year: Never true  Transportation Needs: No Transportation Needs (01/04/2024)   PRAPARE - Administrator, Civil Service (Medical): No    Lack of Transportation (Non-Medical): No  Physical Activity: Inactive (04/28/2019)   Exercise Vital Sign    Days of Exercise per Week: 0 days    Minutes of Exercise per Session: 0 min  Stress: No Stress Concern Present (04/28/2019)   [employer] of Occupational Health - Occupational Stress Questionnaire    Feeling of Stress : Only a little  Social  Connections: Socially Isolated (01/04/2024)   Social Connection and Isolation Panel    Frequency of Communication with Friends and Family: More than three times a week    Frequency of Social Gatherings with Friends and Family: Never    Attends Religious Services: Never    Database Administrator or Organizations: No    Attends Banker Meetings: Never    Marital Status: Widowed  Intimate Partner Violence: Unknown (01/04/2024)   Humiliation, Afraid, Rape, and Kick questionnaire    Fear of Current or Ex-Partner: No    Emotionally Abused: No    Physically Abused: Not on file    Sexually Abused: No     PHYSICAL EXAM  There were no vitals filed for this visit.     There is no height or weight on file to calculate BMI.  Generalized: Well developed, in no acute distress  Cardiology: normal rate and irregularly irregular rhythm, no murmur noted Respiratory: clear to auscultation bilaterally  Neurological examination  Mentation: Alert oriented to time, place, history taking. Follows all commands speech and language  fluent Cranial nerve II-XII: Pupils were equal round reactive to light. Extraocular movements were full, visual field were full  Motor: The motor testing reveals 5 over 5 strength of all 4 extremities. Good symmetric motor tone is noted throughout.  Gait and station: Gait is arthritic but stable with cane     DIAGNOSTIC DATA (LABS, IMAGING, TESTING) - I reviewed patient records, labs, notes, testing and imaging myself where available.      No data to display           Lab Results  Component Value Date   WBC 8.5 01/20/2024   HGB 13.3 01/20/2024   HCT 39.7 01/20/2024   MCV 97.8 01/20/2024   PLT 291 01/20/2024      Component Value Date/Time   NA 146 01/20/2024 1032   K 4.3 01/20/2024 1032   CL 107 01/20/2024 1032   CO2 19 (L) 01/20/2024 1032   GLUCOSE 84 01/20/2024 1032   BUN 17 01/20/2024 1032   CREATININE 0.78 01/20/2024 1032   CALCIUM 9.3  01/20/2024 1032   PROT 6.6 01/20/2024 1032   ALBUMIN  2.4 (L) 09/01/2023 0621   AST 28 01/20/2024 1032   ALT 16 01/20/2024 1032   ALKPHOS 46 09/01/2023 0621   BILITOT 0.3 01/20/2024 1032   GFRNONAA >60 12/06/2023 1100   GFRNONAA 75 04/03/2020 1030   GFRAA 87 04/03/2020 1030   Lab Results  Component Value Date   CHOL 172 09/28/2023   HDL 69 09/28/2023   LDLCALC 87 09/28/2023   TRIG 71 09/28/2023   CHOLHDL 2.5 09/28/2023   Lab Results  Component Value Date   HGBA1C 5.5 09/28/2023   Lab Results  Component Value Date   VITAMINB12 712 01/20/2024   Lab Results  Component Value Date   TSH 0.599 07/01/2023     ASSESSMENT AND PLAN 83 y.o. year old female  has a past medical history of Anemia, Anxiety, Chronic lower back pain, Depression, DJD (degenerative joint disease), Dyspnea, Dysrhythmia, GERD (gastroesophageal reflux disease), History of hiatal hernia, Hypertension, Pneumonia (08/2023), Pulmonary hypertension (HCC), and Sleep apnea. here with   No diagnosis found.   TEESHA OHM is doing well. Compliance report reveals excellent compliance. She will continue to monitor for leak at home. She was encouraged to change out mask regularly. We have discussed multiple possible contributors to her concerns of fatigue and daytime sleepiness. Stimulants not appropriate. She was encouraged to continue using CPAP nightly and for greater than 4 hours each night. We will update supply orders as indicated. Risks of untreated sleep apnea review and education materials provided. Healthy lifestyle habits encouraged. She will follow up in 1 year, sooner if needed. She verbalizes understanding and agreement with this plan.    No orders of the defined types were placed in this encounter.    No orders of the defined types were placed in this encounter.       Greig Forbes, FNP-C 02/22/2024, 4:16 PM [facility] [address] [place], [place] [address] [phone]

## 2024-02-23 ENCOUNTER — Telehealth: Payer: Self-pay

## 2024-02-23 ENCOUNTER — Encounter: Payer: Self-pay | Admitting: Family Medicine

## 2024-02-23 ENCOUNTER — Ambulatory Visit: Payer: Medicare Other | Admitting: Family Medicine

## 2024-02-23 VITALS — BP 115/75 | HR 75 | Ht 70.0 in | Wt 213.5 lb

## 2024-02-23 DIAGNOSIS — G4733 Obstructive sleep apnea (adult) (pediatric): Secondary | ICD-10-CM | POA: Diagnosis not present

## 2024-02-23 NOTE — Telephone Encounter (Signed)
 Cpap order sent to dme

## 2024-02-23 NOTE — Progress Notes (Signed)
 SABRA

## 2024-02-25 ENCOUNTER — Encounter: Payer: Medicare Other | Admitting: Internal Medicine

## 2024-03-01 ENCOUNTER — Ambulatory Visit (HOSPITAL_COMMUNITY)
Admission: RE | Admit: 2024-03-01 | Discharge: 2024-03-01 | Disposition: A | Discharge status: Home / Self Care | Source: Ambulatory Visit | Attending: Family Medicine | Admitting: Family Medicine

## 2024-03-01 DIAGNOSIS — J189 Pneumonia, unspecified organism: Secondary | ICD-10-CM | POA: Insufficient documentation

## 2024-03-04 ENCOUNTER — Encounter: Payer: Self-pay | Admitting: Family Medicine

## 2024-03-06 ENCOUNTER — Telehealth: Payer: Self-pay

## 2024-03-06 ENCOUNTER — Other Ambulatory Visit: Payer: Self-pay | Admitting: Family Medicine

## 2024-03-06 DIAGNOSIS — K2100 Gastro-esophageal reflux disease with esophagitis, without bleeding: Secondary | ICD-10-CM

## 2024-03-06 NOTE — Telephone Encounter (Signed)
 Darlene [name], The patient has sent a my chart message to check on her bone density. Is there something we need to do on our end? Thank you! MJ

## 2024-03-13 ENCOUNTER — Ambulatory Visit: Payer: Self-pay | Admitting: Family Medicine

## 2024-03-18 ENCOUNTER — Other Ambulatory Visit: Payer: Self-pay | Admitting: Family Medicine

## 2024-03-20 ENCOUNTER — Other Ambulatory Visit: Payer: Self-pay | Admitting: Family Medicine

## 2024-04-10 ENCOUNTER — Ambulatory Visit: Admitting: Nurse Practitioner

## 2024-04-13 ENCOUNTER — Ambulatory Visit: Admitting: Nurse Practitioner

## 2024-04-16 ENCOUNTER — Other Ambulatory Visit: Payer: Self-pay | Admitting: Family Medicine

## 2024-04-16 ENCOUNTER — Other Ambulatory Visit: Payer: Self-pay | Admitting: Nurse Practitioner

## 2024-04-16 DIAGNOSIS — I272 Pulmonary hypertension, unspecified: Secondary | ICD-10-CM

## 2024-04-16 DIAGNOSIS — I4891 Unspecified atrial fibrillation: Secondary | ICD-10-CM

## 2024-04-17 ENCOUNTER — Ambulatory Visit: Admitting: Cardiology

## 2024-04-19 NOTE — Telephone Encounter (Signed)
 In accordance with refill protocols, please review and address the following requirements before this medication refill can be authorized:  EKG  Pt needs a EKG within 6 mos. Pt has an appt with Dr. Alvan on 07/10/2024

## 2024-06-26 ENCOUNTER — Ambulatory Visit: Admitting: Neurology

## 2024-07-10 ENCOUNTER — Ambulatory Visit: Admitting: Cardiology

## 2024-08-14 ENCOUNTER — Other Ambulatory Visit (HOSPITAL_BASED_OUTPATIENT_CLINIC_OR_DEPARTMENT_OTHER)

## 2025-03-12 ENCOUNTER — Ambulatory Visit: Admitting: Family Medicine
